# Patient Record
Sex: Female | Born: 1940 | Race: White | Hispanic: No | Marital: Married | Smoking: Former smoker
Health system: Southern US, Community
[De-identification: ages and names within clinical notes are randomized; demographics above are authoritative.]

## PROBLEM LIST (undated history)

## (undated) DIAGNOSIS — E079 Disorder of thyroid, unspecified: Secondary | ICD-10-CM

## (undated) DIAGNOSIS — Z430 Encounter for attention to tracheostomy: Secondary | ICD-10-CM

## (undated) DIAGNOSIS — C50912 Malignant neoplasm of unspecified site of left female breast: Secondary | ICD-10-CM

## (undated) DIAGNOSIS — E119 Type 2 diabetes mellitus without complications: Secondary | ICD-10-CM

## (undated) DIAGNOSIS — Z452 Encounter for adjustment and management of vascular access device: Secondary | ICD-10-CM

## (undated) DIAGNOSIS — R109 Unspecified abdominal pain: Secondary | ICD-10-CM

## (undated) DIAGNOSIS — J690 Pneumonitis due to inhalation of food and vomit: Secondary | ICD-10-CM

## (undated) DIAGNOSIS — J9601 Acute respiratory failure with hypoxia: Secondary | ICD-10-CM

## (undated) DIAGNOSIS — Z8709 Personal history of other diseases of the respiratory system: Secondary | ICD-10-CM

## (undated) DIAGNOSIS — E039 Hypothyroidism, unspecified: Secondary | ICD-10-CM

## (undated) DIAGNOSIS — T4145XA Adverse effect of unspecified anesthetic, initial encounter: Secondary | ICD-10-CM

## (undated) DIAGNOSIS — G4733 Obstructive sleep apnea (adult) (pediatric): Secondary | ICD-10-CM

## (undated) DIAGNOSIS — I10 Essential (primary) hypertension: Secondary | ICD-10-CM

## (undated) DIAGNOSIS — I5032 Chronic diastolic (congestive) heart failure: Secondary | ICD-10-CM

## (undated) DIAGNOSIS — Z4659 Encounter for fitting and adjustment of other gastrointestinal appliance and device: Secondary | ICD-10-CM

## (undated) DIAGNOSIS — F411 Generalized anxiety disorder: Secondary | ICD-10-CM

## (undated) DIAGNOSIS — K219 Gastro-esophageal reflux disease without esophagitis: Secondary | ICD-10-CM

## (undated) DIAGNOSIS — R0682 Tachypnea, not elsewhere classified: Secondary | ICD-10-CM

## (undated) DIAGNOSIS — D62 Acute posthemorrhagic anemia: Secondary | ICD-10-CM

## (undated) DIAGNOSIS — K5790 Diverticulosis of intestine, part unspecified, without perforation or abscess without bleeding: Secondary | ICD-10-CM

## (undated) DIAGNOSIS — F329 Major depressive disorder, single episode, unspecified: Secondary | ICD-10-CM

## (undated) DIAGNOSIS — R935 Abnormal findings on diagnostic imaging of other abdominal regions, including retroperitoneum: Secondary | ICD-10-CM

## (undated) DIAGNOSIS — E785 Hyperlipidemia, unspecified: Secondary | ICD-10-CM

## (undated) DIAGNOSIS — G40901 Epilepsy, unspecified, not intractable, with status epilepticus: Secondary | ICD-10-CM

## (undated) DIAGNOSIS — I2699 Other pulmonary embolism without acute cor pulmonale: Secondary | ICD-10-CM

## (undated) DIAGNOSIS — M1990 Unspecified osteoarthritis, unspecified site: Secondary | ICD-10-CM

## (undated) DIAGNOSIS — J9611 Chronic respiratory failure with hypoxia: Secondary | ICD-10-CM

## (undated) DIAGNOSIS — G4730 Sleep apnea, unspecified: Secondary | ICD-10-CM

## (undated) DIAGNOSIS — F32A Depression, unspecified: Secondary | ICD-10-CM

## (undated) DIAGNOSIS — G9340 Encephalopathy, unspecified: Secondary | ICD-10-CM

## (undated) DIAGNOSIS — R569 Unspecified convulsions: Secondary | ICD-10-CM

## (undated) DIAGNOSIS — K5732 Diverticulitis of large intestine without perforation or abscess without bleeding: Secondary | ICD-10-CM

## (undated) DIAGNOSIS — R1115 Cyclical vomiting syndrome unrelated to migraine: Secondary | ICD-10-CM

## (undated) DIAGNOSIS — T8859XA Other complications of anesthesia, initial encounter: Secondary | ICD-10-CM

## (undated) DIAGNOSIS — Z86711 Personal history of pulmonary embolism: Secondary | ICD-10-CM

## (undated) DIAGNOSIS — I82409 Acute embolism and thrombosis of unspecified deep veins of unspecified lower extremity: Secondary | ICD-10-CM

## (undated) DIAGNOSIS — S060X9A Concussion with loss of consciousness of unspecified duration, initial encounter: Secondary | ICD-10-CM

## (undated) HISTORY — DX: Encounter for adjustment and management of vascular access device: Z45.2

## (undated) HISTORY — PX: BUNIONECTOMY WITH HAMMERTOE RECONSTRUCTION: SHX5600

## (undated) HISTORY — DX: Generalized anxiety disorder: F41.1

## (undated) HISTORY — PX: JOINT REPLACEMENT: SHX530

## (undated) HISTORY — DX: Encounter for attention to tracheostomy: Z43.0

## (undated) HISTORY — PX: ABDOMINAL HYSTERECTOMY: SHX81

## (undated) HISTORY — DX: Diverticulosis of intestine, part unspecified, without perforation or abscess without bleeding: K57.90

## (undated) HISTORY — DX: Abnormal findings on diagnostic imaging of other abdominal regions, including retroperitoneum: R93.5

## (undated) HISTORY — DX: Acute respiratory failure with hypoxia: J96.01

## (undated) HISTORY — DX: Encephalopathy, unspecified: G93.40

## (undated) HISTORY — DX: Essential (primary) hypertension: I10

## (undated) HISTORY — DX: Acute posthemorrhagic anemia: D62

## (undated) HISTORY — DX: Chronic respiratory failure with hypoxia: J96.11

## (undated) HISTORY — DX: Cyclical vomiting syndrome unrelated to migraine: R11.15

## (undated) HISTORY — DX: Encounter for fitting and adjustment of other gastrointestinal appliance and device: Z46.59

## (undated) HISTORY — DX: Epilepsy, unspecified, not intractable, with status epilepticus: G40.901

## (undated) HISTORY — PX: OTHER SURGICAL HISTORY: SHX169

## (undated) HISTORY — DX: Tachypnea, not elsewhere classified: R06.82

## (undated) HISTORY — DX: Sleep apnea, unspecified: G47.30

## (undated) HISTORY — PX: LAPAROSCOPIC CHOLECYSTECTOMY: SUR755

## (undated) HISTORY — DX: Personal history of other diseases of the respiratory system: Z87.09

## (undated) HISTORY — DX: Unspecified abdominal pain: R10.9

## (undated) HISTORY — DX: Diverticulitis of large intestine without perforation or abscess without bleeding: K57.32

## (undated) HISTORY — DX: Personal history of pulmonary embolism: Z86.711

## (undated) HISTORY — DX: Pneumonitis due to inhalation of food and vomit: J69.0

## (undated) HISTORY — DX: Type 2 diabetes mellitus without complications: E11.9

## (undated) HISTORY — PX: TOTAL KNEE ARTHROPLASTY: SHX125

---

## 2004-07-17 DIAGNOSIS — C50912 Malignant neoplasm of unspecified site of left female breast: Secondary | ICD-10-CM

## 2004-07-17 DIAGNOSIS — Z853 Personal history of malignant neoplasm of breast: Secondary | ICD-10-CM

## 2004-07-17 HISTORY — PX: BREAST BIOPSY: SHX20

## 2004-07-17 HISTORY — DX: Personal history of malignant neoplasm of breast: Z85.3

## 2004-07-17 HISTORY — DX: Malignant neoplasm of unspecified site of left female breast: C50.912

## 2004-07-17 HISTORY — PX: BREAST LUMPECTOMY: SHX2

## 2008-07-17 HISTORY — PX: TOTAL HIP ARTHROPLASTY: SHX124

## 2013-03-17 DIAGNOSIS — I2699 Other pulmonary embolism without acute cor pulmonale: Secondary | ICD-10-CM | POA: Insufficient documentation

## 2013-03-17 DIAGNOSIS — S060XAA Concussion with loss of consciousness status unknown, initial encounter: Secondary | ICD-10-CM

## 2013-03-17 DIAGNOSIS — I82409 Acute embolism and thrombosis of unspecified deep veins of unspecified lower extremity: Secondary | ICD-10-CM

## 2013-03-17 DIAGNOSIS — S060X9A Concussion with loss of consciousness of unspecified duration, initial encounter: Secondary | ICD-10-CM

## 2013-03-17 HISTORY — DX: Acute embolism and thrombosis of unspecified deep veins of unspecified lower extremity: I82.409

## 2013-03-17 HISTORY — DX: Other pulmonary embolism without acute cor pulmonale: I26.99

## 2013-03-17 HISTORY — DX: Concussion with loss of consciousness status unknown, initial encounter: S06.0XAA

## 2013-03-17 HISTORY — DX: Concussion with loss of consciousness of unspecified duration, initial encounter: S06.0X9A

## 2013-03-31 ENCOUNTER — Encounter (HOSPITAL_BASED_OUTPATIENT_CLINIC_OR_DEPARTMENT_OTHER): Payer: Self-pay | Admitting: Emergency Medicine

## 2013-03-31 ENCOUNTER — Emergency Department (HOSPITAL_BASED_OUTPATIENT_CLINIC_OR_DEPARTMENT_OTHER): Payer: Medicare PPO

## 2013-03-31 ENCOUNTER — Inpatient Hospital Stay (HOSPITAL_BASED_OUTPATIENT_CLINIC_OR_DEPARTMENT_OTHER)
Admission: EM | Admit: 2013-03-31 | Discharge: 2013-04-04 | DRG: 176 | Disposition: A | Discharge status: Home / Self Care | Payer: Medicare PPO | Attending: Internal Medicine | Admitting: Internal Medicine

## 2013-03-31 DIAGNOSIS — S0093XA Contusion of unspecified part of head, initial encounter: Secondary | ICD-10-CM

## 2013-03-31 DIAGNOSIS — E039 Hypothyroidism, unspecified: Secondary | ICD-10-CM

## 2013-03-31 DIAGNOSIS — E119 Type 2 diabetes mellitus without complications: Secondary | ICD-10-CM

## 2013-03-31 DIAGNOSIS — Z853 Personal history of malignant neoplasm of breast: Secondary | ICD-10-CM

## 2013-03-31 DIAGNOSIS — R296 Repeated falls: Secondary | ICD-10-CM | POA: Diagnosis present

## 2013-03-31 DIAGNOSIS — I824Z2 Acute embolism and thrombosis of unspecified deep veins of left distal lower extremity: Secondary | ICD-10-CM

## 2013-03-31 DIAGNOSIS — Z87891 Personal history of nicotine dependence: Secondary | ICD-10-CM

## 2013-03-31 DIAGNOSIS — S060XAA Concussion with loss of consciousness status unknown, initial encounter: Secondary | ICD-10-CM

## 2013-03-31 DIAGNOSIS — I2699 Other pulmonary embolism without acute cor pulmonale: Principal | ICD-10-CM

## 2013-03-31 DIAGNOSIS — G4733 Obstructive sleep apnea (adult) (pediatric): Secondary | ICD-10-CM

## 2013-03-31 DIAGNOSIS — S060X9A Concussion with loss of consciousness of unspecified duration, initial encounter: Secondary | ICD-10-CM

## 2013-03-31 DIAGNOSIS — I82409 Acute embolism and thrombosis of unspecified deep veins of unspecified lower extremity: Secondary | ICD-10-CM

## 2013-03-31 DIAGNOSIS — R404 Transient alteration of awareness: Secondary | ICD-10-CM | POA: Diagnosis present

## 2013-03-31 DIAGNOSIS — S060X1A Concussion with loss of consciousness of 30 minutes or less, initial encounter: Secondary | ICD-10-CM

## 2013-03-31 DIAGNOSIS — R42 Dizziness and giddiness: Secondary | ICD-10-CM | POA: Diagnosis present

## 2013-03-31 DIAGNOSIS — I10 Essential (primary) hypertension: Secondary | ICD-10-CM

## 2013-03-31 DIAGNOSIS — I824Z9 Acute embolism and thrombosis of unspecified deep veins of unspecified distal lower extremity: Secondary | ICD-10-CM | POA: Diagnosis present

## 2013-03-31 DIAGNOSIS — W19XXXA Unspecified fall, initial encounter: Secondary | ICD-10-CM

## 2013-03-31 HISTORY — DX: Disorder of thyroid, unspecified: E07.9

## 2013-03-31 HISTORY — DX: Essential (primary) hypertension: I10

## 2013-03-31 HISTORY — DX: Unspecified osteoarthritis, unspecified site: M19.90

## 2013-03-31 LAB — CBC WITH DIFFERENTIAL/PLATELET
Eosinophils Absolute: 0 10*3/uL (ref 0.0–0.7)
Lymphocytes Relative: 15 % (ref 12–46)
MCHC: 32.3 g/dL (ref 30.0–36.0)
Monocytes Absolute: 0.6 10*3/uL (ref 0.1–1.0)
Neutrophils Relative %: 74 % (ref 43–77)
RDW: 15 % (ref 11.5–15.5)

## 2013-03-31 LAB — TROPONIN I: Troponin I: <0.3 ng/mL (ref <0.30)

## 2013-03-31 LAB — URINALYSIS, ROUTINE W REFLEX MICROSCOPIC
Glucose, UA: NEGATIVE mg/dL
Nitrite: NEGATIVE
Protein, ur: NEGATIVE mg/dL

## 2013-03-31 LAB — COMPREHENSIVE METABOLIC PANEL
Albumin: 3 g/dL — ABNORMAL LOW (ref 3.5–5.2)
BUN: 27 mg/dL — ABNORMAL HIGH (ref 6–23)
Chloride: 95 mEq/L — ABNORMAL LOW (ref 96–112)
Creatinine, Ser: 1.4 mg/dL — ABNORMAL HIGH (ref 0.50–1.10)
Total Bilirubin: 0.5 mg/dL (ref 0.3–1.2)
Total Protein: 6.2 g/dL (ref 6.0–8.3)

## 2013-03-31 LAB — APTT: aPTT: 30 seconds (ref 24–37)

## 2013-03-31 MED ORDER — HEPARIN BOLUS VIA INFUSION
4000.0000 [IU] | Freq: Once | INTRAVENOUS | Status: AC
  Administered 2013-03-31: 4000 [IU] via INTRAVENOUS

## 2013-03-31 MED ORDER — IOHEXOL 350 MG/ML SOLN
64.0000 mL | Freq: Once | INTRAVENOUS | Status: AC | PRN
  Administered 2013-03-31: 64 mL via INTRAVENOUS

## 2013-03-31 MED ORDER — SODIUM CHLORIDE 0.9 % IV SOLN
Freq: Once | INTRAVENOUS | Status: AC
  Administered 2013-03-31: 21:00:00 via INTRAVENOUS

## 2013-03-31 MED ORDER — HEPARIN (PORCINE) IN NACL 100-0.45 UNIT/ML-% IJ SOLN
900.0000 [IU]/h | INTRAMUSCULAR | Status: AC
Start: 2013-03-31 — End: 2013-04-03
  Administered 2013-03-31: 1150 [IU]/h via INTRAVENOUS
  Administered 2013-04-01 – 2013-04-02 (×2): 800 [IU]/h via INTRAVENOUS
  Filled 2013-03-31 (×5): qty 250

## 2013-03-31 MED ORDER — HEPARIN BOLUS VIA INFUSION: 4000.0000 [IU] | Freq: Once | INTRAVENOUS | Status: DC

## 2013-03-31 NOTE — ED Provider Notes (Signed)
Medical screening examination/treatment/procedure(s) were conducted as a shared visit with non-physician practitioner(s) and myself.  I personally evaluated the patient during the encounter CRITICAL CARE Performed by: Blanchie Dessert Total critical care time: 30 Critical care time was exclusive of separately billable procedures and treating other patients. Critical care was necessary to treat or prevent imminent or life-threatening deterioration. Critical care was time spent personally by me on the following activities: development of treatment plan with patient and/or surrogate as well as nursing, discussions with consultants, evaluation of patient's response to treatment, examination of patient, obtaining history from patient or surrogate, ordering and performing treatments and interventions, ordering and review of laboratory studies, ordering and review of radiographic studies, pulse oximetry and re-evaluation of patient's condition.   Pt present with fall and concern for concussion vs head bleed that will need CT but n/V intact without signs of neuro deficit and no anticoagulation.  Secondly pt with hypoxia requiring O2 and leg swelling concerning for DVT.  Also sx concerning for PE.  CT chest with evidence of bilateral PE and left DVT.  Pt started on heparin gtt after confirmed no head bleed.  Admitted for further care.  Blanchie Dessert, MD 03/31/13 2325

## 2013-03-31 NOTE — ED Provider Notes (Signed)
CSN: [id number]     Arrival date & time 03/31/13  1808 History   First MD Initiated Contact with Patient 03/31/13 1810     Chief Complaint  Patient presents with  . Fall  . Near Syncope   (Consider location/radiation/quality/duration/timing/severity/associated sxs/prior Treatment) Patient is a 72 y.o. female presenting with fall. The history is provided by the patient. No language interpreter was used.  Fall This is a new problem. The current episode started today. The problem has been gradually improving. Associated symptoms include myalgias. Pertinent negatives include no abdominal pain, chest pain or neck pain. Nothing aggravates the symptoms. She has tried nothing for the symptoms.  Pt was putting muffins in the oven and fell.  Pt reports she hit her head and was knocked out.   Pt reports she remembers the fall.  No loc before fall.   Pt does not know why she fell.   Pt has been getting increasingly short of breath for the past 2 days.  Pt traveled here from [place] recently.  Pt reports she has had pain in her left leg and swelling in her left leg  Past Medical History  Diagnosis Date  . Diabetes mellitus without complication   . Hypertension   . Thyroid disease     Hypothyroid  . Cancer     breast   Past Surgical History  Procedure Laterality Date  . Joint replacement      bilateral knee, L hip   History reviewed. No pertinent family history. History  Substance Use Topics  . Smoking status: Never Smoker   . Smokeless tobacco: Not on file  . Alcohol Use: No   OB History   Grav Para Term Preterm Abortions TAB SAB Ect Mult Living                 Review of Systems  HENT: Negative for neck pain.   Cardiovascular: Negative for chest pain.  Gastrointestinal: Negative for abdominal pain.  Musculoskeletal: Positive for myalgias.  All other systems reviewed and are negative.    Allergies  Review of patient's allergies indicates no known allergies.  Home Medications    Current Outpatient Rx  Name  Route  Sig  Dispense  Refill  . metFORMIN (GLUCOPHAGE) 1000 MG tablet   Oral   Take 1,000 mg by mouth 2 (two) times daily with a meal.          Pulse 76  SpO2 83% Physical Exam  Nursing note and vitals reviewed. Constitutional: She is oriented to person, place, and time. She appears well-developed and well-nourished.  HENT:  Head: Normocephalic and atraumatic.  Eyes: Conjunctivae are normal. Pupils are equal, round, and reactive to light.  Neck: Normal range of motion.  Cardiovascular: Normal rate and regular rhythm.   Pulmonary/Chest: Effort normal.  Abdominal: Soft. Bowel sounds are normal.  Musculoskeletal: She exhibits tenderness.  Tender left calf,  Positive homan's sign    Neurological: She is alert and oriented to person, place, and time. She has normal reflexes.  Skin: Skin is warm.  Psychiatric: She has a normal mood and affect.    ED Course  Procedures (including critical care time) Labs Review Labs Reviewed  CBC WITH DIFFERENTIAL  COMPREHENSIVE METABOLIC PANEL  URINALYSIS, ROUTINE W REFLEX MICROSCOPIC   Imaging Review Ct Head Wo Contrast  03/31/2013   CLINICAL DATA:  Fall with posterior head injury.  EXAM: CT HEAD WITHOUT CONTRAST  TECHNIQUE: Contiguous axial images were obtained from the base of the skull  through the vertex without intravenous contrast.  COMPARISON:  None.  FINDINGS: There is some scalp soft tissue swelling over the right posterior parietal region. No associated skull fracture is seen. The brain shows small vessel disease and atrophy. The brain demonstrates no evidence of hemorrhage, infarction, edema, mass effect, extra-axial fluid collection, hydrocephalus or mass lesion.  IMPRESSION: Right posterior parietal scalp soft tissue injury. No acute intracranial findings.   Electronically Signed   By: Aletta Edouard   On: 03/31/2013 19:46   Ct Angio Chest Pe W/cm &/or Wo Cm  03/31/2013   CLINICAL DATA:  Shortness of  breast, leg pain and history of breast carcinoma.  EXAM: CT ANGIOGRAPHY CHEST WITH CONTRAST  TECHNIQUE: Multidetector CT imaging of the chest was performed using the standard protocol during bolus administration of intravenous contrast. Multiplanar CT image reconstructions including MIPs were obtained to evaluate the vascular anatomy.  CONTRAST:  73m OMNIPAQUE IOHEXOL 350 MG/ML SOLN  COMPARISON:  None.  FINDINGS: Acute pulmonary embolism is identified with moderate clot burden on the right clot which is nonobstructive in the lower lobe, middle lobe and upper lobe . Some clot also extends into the main right pulmonary artery. Small clot burden is present on the left with nonocclusive thrombus extending into lower lobe branches and lingular branches. No associated gross evidence of right heart strain or pulmonary edema.  Lung windows show no parenchymal consolidation, infarcts or pleural effusions. A calcified granuloma is present in the right lower lobe.  No pericardial fluid is identified. There is at least 1 focal area of calcified plaque is identified in the region of the LAD. No masses, enlarged lymph nodes or bony lesions are identified.  Review of the MIP images confirms the above findings.  IMPRESSION: Acute pulmonary embolism with moderate clot burden on the right and smaller clot burden on the left. Thrombus is nonocclusive with the most prominent thrombus in the right lower lobe.  CriticalValue/emergent results were called by telephone at the time of interpretation on 03/31/2013 at 7:55 PMto Dr. PMaryan Rued who verbally acknowledged these results.   Electronically Signed   By: GAletta Edouard  On: 03/31/2013 19:58   UKoreaVenous Img Lower Unilateral Left  03/31/2013   *RADIOLOGY REPORT*  Clinical Data: Left leg pain and swelling  LEFT LOWER EXTREMITY VENOUS DUPLEX ULTRASOUND  Technique:  Gray-scale sonography with graded compression, as well as color Doppler and duplex ultrasound, were performed to evaluate  the deep venous system of the lower extremity from the level of the common femoral vein through the popliteal and proximal calf veins. Spectral Doppler was utilized to evaluate flow at rest and with distal augmentation maneuvers.  Comparison:  None.  Findings: The upper common femoral vein is compressible.  There is nonocclusive thrombus in the lower common femoral vein.   The femoral vein is nearly completely occluded and filled with thrombus.  The popliteal vein is noncompressible and occluded. Posterior tibial, anterior tibial, and peroneal veins are all occluded with thrombus.  The greater saphenous vein is also noncompressible and occluded with thrombus.  IMPRESSION: Extensive thrombus throughout the left lower extremity extending from the left common femoral vein to the calf.   Original Report Authenticated By: AMarybelle Killings M.D.    MDM   1. PE (pulmonary embolism)   2. DVT, lower extremity, distal, acute, left   3. Contusion of head, initial encounter   4. Fall at home, initial encounter    Dr. pMaryan Ruedin to see and examine pt.  Date: 03/31/2013  Rate: 74  Rhythm: normal sinus rhythm  QRS Axis: normal  Intervals: normal  ST/T Wave abnormalities: nonspecific ST changes  Conduction Disutrbances:none  Narrative Interpretation:   Old EKG Reviewed: none available  Ct scan shows bilat PE's and ultrasound shows left leg DVt Heparin ordered.   Pt and family counseled on results.   Pulse Ox improved to 99% on 2 liters 02.  Fransico Meadow, PA-C 03/31/13 2119  Fransico Meadow, PA-C 03/31/13 2119

## 2013-03-31 NOTE — Progress Notes (Signed)
ANTICOAGULATION CONSULT NOTE - Initial Consult  Pharmacy Consult for Heparin Indication: PE  No Known Allergies  Patient Measurements: Height: _0  (154.9 cm) Weight: 180 lb (81.647 kg) IBW/kg (Calculated) : 47.8 Heparin Dosing Weight: 66.3 kg  Vital Signs: Temp: 99 F (37.2 C) (09/15 1826) Temp src: Oral (09/15 1826) BP: 125/66 mmHg (09/15 1826) Pulse Rate: 80 (09/15 1826)  Labs:  Recent Labs  03/31/13 1830  HGB 10.7*  HCT 33.1*  PLT 202  CREATININE 1.40*  TROPONINI <0.30    Estimated Creatinine Clearance: 35.2 ml/min (by C-G formula based on Cr of 1.4).   Medical History: Past Medical History  Diagnosis Date  . Diabetes mellitus without complication   . Hypertension   . Thyroid disease     Hypothyroid  . Cancer     breast    Medications:  See PTA medication list  Assessment: 72 yo female to begin a heparin drip for bilateral PE confirmed by CTA chest. No bleeding noted, H/H are low at baseline, platelets are wnl. SCr is elevated at 1.4.  Goal of Therapy:  Heparin level 0.3-0.7 units/ml Monitor platelets by anticoagulation protocol: Yes   Plan:  - Heparin 4000 unit IV bolus then infuse at 1150 units/hr - 8 hr heparin level - Daily heparin level and CBC - Monitor for signs/symptoms of bleeding  [facility], [place].D., BCPS Clinical Pharmacist Pager: [phone] 03/31/2013 8:57 PM

## 2013-03-31 NOTE — ED Notes (Signed)
Pt in radiology at this time. 

## 2013-03-31 NOTE — ED Notes (Signed)
Pt reports falling yesterday and today anfter an episode of dizziness. Still has dizziness and nausea when hob elevated. Also having SOB with exertion and pain on LLE.

## 2013-04-01 ENCOUNTER — Encounter (HOSPITAL_COMMUNITY): Payer: Self-pay | Admitting: Family Medicine

## 2013-04-01 DIAGNOSIS — S060XAA Concussion with loss of consciousness status unknown, initial encounter: Secondary | ICD-10-CM

## 2013-04-01 DIAGNOSIS — Z853 Personal history of malignant neoplasm of breast: Secondary | ICD-10-CM

## 2013-04-01 DIAGNOSIS — I10 Essential (primary) hypertension: Secondary | ICD-10-CM

## 2013-04-01 DIAGNOSIS — S060X1A Concussion with loss of consciousness of 30 minutes or less, initial encounter: Secondary | ICD-10-CM

## 2013-04-01 DIAGNOSIS — I2699 Other pulmonary embolism without acute cor pulmonale: Secondary | ICD-10-CM

## 2013-04-01 DIAGNOSIS — I82409 Acute embolism and thrombosis of unspecified deep veins of unspecified lower extremity: Secondary | ICD-10-CM

## 2013-04-01 DIAGNOSIS — E119 Type 2 diabetes mellitus without complications: Secondary | ICD-10-CM

## 2013-04-01 DIAGNOSIS — E039 Hypothyroidism, unspecified: Secondary | ICD-10-CM

## 2013-04-01 DIAGNOSIS — Z9989 Dependence on other enabling machines and devices: Secondary | ICD-10-CM | POA: Insufficient documentation

## 2013-04-01 DIAGNOSIS — G4733 Obstructive sleep apnea (adult) (pediatric): Secondary | ICD-10-CM

## 2013-04-01 DIAGNOSIS — S060X9A Concussion with loss of consciousness of unspecified duration, initial encounter: Secondary | ICD-10-CM

## 2013-04-01 HISTORY — DX: Type 2 diabetes mellitus without complications: E11.9

## 2013-04-01 HISTORY — DX: Essential (primary) hypertension: I10

## 2013-04-01 HISTORY — DX: Obstructive sleep apnea (adult) (pediatric): G47.33

## 2013-04-01 HISTORY — DX: Personal history of malignant neoplasm of breast: Z85.3

## 2013-04-01 HISTORY — DX: Other pulmonary embolism without acute cor pulmonale: I26.99

## 2013-04-01 LAB — GLUCOSE, CAPILLARY
Glucose-Capillary: 102 mg/dL — ABNORMAL HIGH (ref 70–99)
Glucose-Capillary: 117 mg/dL — ABNORMAL HIGH (ref 70–99)
Glucose-Capillary: 138 mg/dL — ABNORMAL HIGH (ref 70–99)
Glucose-Capillary: 156 mg/dL — ABNORMAL HIGH (ref 70–99)
Glucose-Capillary: 95 mg/dL (ref 70–99)

## 2013-04-01 LAB — TSH: TSH: 0.862 u[IU]/mL (ref 0.350–4.500)

## 2013-04-01 LAB — PHOSPHORUS: Phosphorus: 4 mg/dL (ref 2.3–4.6)

## 2013-04-01 LAB — BASIC METABOLIC PANEL
BUN: 20 mg/dL (ref 6–23)
Chloride: 98 mEq/L (ref 96–112)
Creatinine, Ser: 1.05 mg/dL (ref 0.50–1.10)
GFR calc Af Amer: 60 mL/min — ABNORMAL LOW (ref >90)

## 2013-04-01 LAB — URINE CULTURE
Colony Count: NO GROWTH
Culture: NO GROWTH

## 2013-04-01 LAB — PROTIME-INR
INR: 1.08 (ref 0.00–1.49)
Prothrombin Time: 13.8 seconds (ref 11.6–15.2)

## 2013-04-01 MED ORDER — GLYBURIDE 1.25 MG PO TABS
1.2500 mg | ORAL_TABLET | Freq: Every day | ORAL | Status: DC
  Administered 2013-04-01 – 2013-04-04 (×4): 1.25 mg via ORAL
  Filled 2013-04-01 (×5): qty 1

## 2013-04-01 MED ORDER — DEXTROSE 5 % IV SOLN
1.0000 g | INTRAVENOUS | Status: DC
  Administered 2013-04-01 – 2013-04-02 (×2): 1 g via INTRAVENOUS
  Filled 2013-04-01 (×3): qty 10

## 2013-04-01 MED ORDER — ONDANSETRON HCL 4 MG/2ML IJ SOLN
4.0000 mg | Freq: Four times a day (QID) | INTRAMUSCULAR | Status: DC | PRN
  Administered 2013-04-02: 4 mg via INTRAVENOUS
  Filled 2013-04-01: qty 2

## 2013-04-01 MED ORDER — ACETAMINOPHEN 325 MG PO TABS: 650.0000 mg | ORAL_TABLET | Freq: Four times a day (QID) | ORAL | Status: DC | PRN

## 2013-04-01 MED ORDER — INSULIN ASPART 100 UNIT/ML ~~LOC~~ SOLN: 0.0000 [IU] | Freq: Every day | SUBCUTANEOUS | Status: DC

## 2013-04-01 MED ORDER — LEVOTHYROXINE SODIUM 100 MCG PO TABS
100.0000 ug | ORAL_TABLET | Freq: Every day | ORAL | Status: DC
  Administered 2013-04-01 – 2013-04-04 (×4): 100 ug via ORAL
  Filled 2013-04-01 (×5): qty 1

## 2013-04-01 MED ORDER — MECLIZINE HCL 25 MG PO TABS
25.0000 mg | ORAL_TABLET | Freq: Three times a day (TID) | ORAL | Status: DC | PRN
  Filled 2013-04-01 (×2): qty 1

## 2013-04-01 MED ORDER — SODIUM CHLORIDE 0.9 % IJ SOLN
3.0000 mL | Freq: Two times a day (BID) | INTRAMUSCULAR | Status: DC
  Administered 2013-04-01 – 2013-04-04 (×7): 3 mL via INTRAVENOUS

## 2013-04-01 MED ORDER — METFORMIN HCL 500 MG PO TABS
500.0000 mg | ORAL_TABLET | Freq: Two times a day (BID) | ORAL | Status: DC
  Filled 2013-04-01 (×3): qty 1

## 2013-04-01 MED ORDER — SIMVASTATIN 40 MG PO TABS
40.0000 mg | ORAL_TABLET | Freq: Every evening | ORAL | Status: DC
  Filled 2013-04-01: qty 1

## 2013-04-01 MED ORDER — DULOXETINE HCL 60 MG PO CPEP
60.0000 mg | ORAL_CAPSULE | Freq: Every day | ORAL | Status: DC
  Administered 2013-04-01 – 2013-04-04 (×4): 60 mg via ORAL
  Filled 2013-04-01 (×4): qty 1

## 2013-04-01 MED ORDER — PANTOPRAZOLE SODIUM 40 MG PO TBEC
40.0000 mg | DELAYED_RELEASE_TABLET | Freq: Every day | ORAL | Status: DC
  Administered 2013-04-01 – 2013-04-04 (×4): 40 mg via ORAL
  Filled 2013-04-01 (×4): qty 1

## 2013-04-01 MED ORDER — WARFARIN - PHARMACIST DOSING INPATIENT: Freq: Every day | Status: DC

## 2013-04-01 MED ORDER — IRBESARTAN 300 MG PO TABS
300.0000 mg | ORAL_TABLET | Freq: Every day | ORAL | Status: DC
  Administered 2013-04-01 – 2013-04-02 (×2): 300 mg via ORAL
  Filled 2013-04-01 (×2): qty 1

## 2013-04-01 MED ORDER — VERAPAMIL HCL 120 MG PO TABS
180.0000 mg | ORAL_TABLET | Freq: Every day | ORAL | Status: DC
  Administered 2013-04-01 – 2013-04-04 (×4): 180 mg via ORAL
  Filled 2013-04-01 (×4): qty 1.5

## 2013-04-01 MED ORDER — ATORVASTATIN CALCIUM 20 MG PO TABS
20.0000 mg | ORAL_TABLET | Freq: Every day | ORAL | Status: DC
  Administered 2013-04-01 – 2013-04-03 (×3): 20 mg via ORAL
  Filled 2013-04-01 (×4): qty 1

## 2013-04-01 MED ORDER — METOPROLOL SUCCINATE ER 100 MG PO TB24
200.0000 mg | ORAL_TABLET | Freq: Every day | ORAL | Status: DC
  Administered 2013-04-01 – 2013-04-04 (×4): 200 mg via ORAL
  Filled 2013-04-01 (×4): qty 2

## 2013-04-01 MED ORDER — FUROSEMIDE 40 MG PO TABS: 40.0000 mg | ORAL_TABLET | Freq: Every day | ORAL | Status: DC

## 2013-04-01 MED ORDER — SENNOSIDES-DOCUSATE SODIUM 8.6-50 MG PO TABS
1.0000 | ORAL_TABLET | Freq: Every evening | ORAL | Status: DC | PRN
  Filled 2013-04-01 (×2): qty 1

## 2013-04-01 MED ORDER — ONDANSETRON HCL 4 MG PO TABS: 4.0000 mg | ORAL_TABLET | Freq: Four times a day (QID) | ORAL | Status: DC | PRN

## 2013-04-01 MED ORDER — INSULIN ASPART 100 UNIT/ML ~~LOC~~ SOLN
0.0000 [IU] | Freq: Three times a day (TID) | SUBCUTANEOUS | Status: DC
  Administered 2013-04-01: 2 [IU] via SUBCUTANEOUS
  Administered 2013-04-02: 3 [IU] via SUBCUTANEOUS
  Administered 2013-04-03: 5 [IU] via SUBCUTANEOUS
  Administered 2013-04-03 – 2013-04-04 (×2): 2 [IU] via SUBCUTANEOUS

## 2013-04-01 MED ORDER — HYDROCODONE-ACETAMINOPHEN 5-325 MG PO TABS
1.0000 | ORAL_TABLET | ORAL | Status: DC | PRN
  Administered 2013-04-01 – 2013-04-02 (×2): 2 via ORAL
  Filled 2013-04-01 (×2): qty 2

## 2013-04-01 MED ORDER — ACETAMINOPHEN 650 MG RE SUPP: 650.0000 mg | Freq: Four times a day (QID) | RECTAL | Status: DC | PRN

## 2013-04-01 MED ORDER — SPIRONOLACTONE 25 MG PO TABS
25.0000 mg | ORAL_TABLET | Freq: Every day | ORAL | Status: DC
  Administered 2013-04-01 – 2013-04-02 (×2): 25 mg via ORAL
  Filled 2013-04-01 (×2): qty 1

## 2013-04-01 NOTE — Progress Notes (Signed)
UR Completed Olamae Ferrara Graves-Bigelow, RN,BSN [phone]  

## 2013-04-01 NOTE — Care Management Note (Signed)
    Page 1 of 1   04/04/2013     10:54:43 AM   CARE MANAGEMENT NOTE 04/04/2013  Patient:  Amanda Short, Amanda Short   Account Number:  [id number]  Date Initiated:  04/01/2013  Documentation initiated by:  GRAVES-BIGELOW,Xochilth Standish  Subjective/Objective Assessment:   Pt admitted for fall. + for PE/DVT. Initiated on IV heparin.     Action/Plan:   CM will continue to monitor for disposition needs.   Anticipated DC Date:  04/03/2013   Anticipated DC Plan:  [place]  CM consult      Choice offered to / List presented to:             Status of service:  Completed, signed off Medicare Important Message given?   (If response is "NO", the following Medicare IM given date fields will be blank) Date Medicare IM given:   Date Additional Medicare IM given:    Discharge Disposition:  HOME/SELF CARE  Per UR Regulation:  Reviewed for med. necessity/level of care/duration of stay  If discussed at [address] of Stay Meetings, dates discussed:    Comments:   04-04-13 [place], [place] [phone] CM faxed information to the neuro rehab facility for outpatient f/u. Pt aware and f/u appointment made at [facility] [place] for f/u lab draws. Will make husband aware and fax information to office once husband arrives. No further needs from CM at this time.  04-03-13 [place], [place] [phone] [place] check in process for lovenox. Pt uses [facility] in [place]. CM will call to see if medication is available. Per husband he will be able to give injections and the RN to do lovenox teaching today. [facility] [address] - and medication is available for 10 syringes. Test claim done & co pay  will be $10.00. Pt states she has PCP in [place] and does not have one here in [place]. Pt to call PCP in [place] to see who they may recommend for f/u here in [place]. CM to touch base with pt this afternoon.

## 2013-04-01 NOTE — Progress Notes (Signed)
Pt will place on when ready for bed. Pt encouraged to call RT if needing any assistance. Family at bedside. No distress noted.

## 2013-04-01 NOTE — Progress Notes (Signed)
Pt came from [facility] with dx of DVT and PE, pt came to the floor with heparin drip infusing. Admitting MD was paged as ordered,  Pt is alert and oriented x4, husband is at the bedside, pt was placed on tele, vital signs were obtained, admission hx and assessment was completed. Will continue to monitor pt.-----Windell Musson, rn

## 2013-04-01 NOTE — Progress Notes (Signed)
ANTICOAGULATION CONSULT NOTE - Follow Up Consult  Pharmacy Consult for heparin Indication: pulmonary embolus  Labs:  Recent Labs  03/31/13 1830 03/31/13 2047 04/01/13 0500  HGB 10.7*  --   --   HCT 33.1*  --   --   PLT 202  --   --   APTT  --  30  --   LABPROT  --  13.9  --   INR  --  1.09  --   HEPARINUNFRC  --   --  0.96*  CREATININE 1.40*  --   --   TROPONINI <0.30  --   --     Assessment: 72yo female supratherapeutic on heparin with initial dosing for PE.  Goal of Therapy:  Heparin level 0.3-0.7 units/ml   Plan:  Will decrease heparin gtt by 2 units/kg/hr to 1000 units/hr and check level in [place], PharmD, BCPS  04/01/2013,6:00 AM

## 2013-04-01 NOTE — Progress Notes (Signed)
PT Cancellation Note  Patient Details Name: Amanda Short MRN: [id number] DOB: May 16, 1941   Cancelled Treatment:    Reason Eval/Treat Not Completed: Other (comment) (pt with acute DVT, PE not yet 24hrs since admit on heparin and RN asked to hold. Will wait until next date to initiate eval.)   [name] 04/01/2013, 12:03 PM Elwyn Reach, [place]

## 2013-04-01 NOTE — H&P (Signed)
PCP:   Dr. Richrd Sox in [place]   Chief Complaint:  Fall  HPI: This is a 72 year old female who is here visiting from [place], today she was in the kitchen cooking when she fell. She hit her head and had a brief loss of consciousness. It was unwitnessed fall. Patient states she saw stars when she hit her head and on awakening she had nausea but no vomiting. She has a mild headache and continues to be dizzy since hitting her head. When her husband came home, he called 911 she was brought to the ER. The patient identifies no precipitating causes. She denies tripping, denies chest pain, denies palpitations. Of note yesterday she was also sitting in her chair, she leaned over to pick up an object and fell out of the chair and hit her head.  While in the ER at med center [facility] further questioning revealed a complaint of shortness of breath and left leg pain and swelling for the past 2-3 days. Workup revealed that the patient has a DVT and PE, we were called and a requested transfer was made. The patient reports decreased activity because of back problems, however, she does her ADLs, she does not use a cane or walker and she mobilizes around her house. She has a history of breast cancer, she's been cancer free for 7 years. Her last visit with her oncologist was in January, workup included mammogram and blood work. All of which were normal. The patient reports no altered mentation, no seizures.  The patient states she's been in good health. No nausea, vomiting, diarrhea. She states her appetite is good as is her energy. She reports no weight loss.   Review of Systems:  The patient denies anorexia, fever, weight loss,, vision loss, decreased hearing, hoarseness, chest pain, syncope, dyspnea on exertion, peripheral edema, balance deficits, hemoptysis, abdominal pain, melena, hematochezia, severe indigestion/heartburn, hematuria, incontinence, genital sores, muscle weakness, suspicious skin  lesions, transient blindness, difficulty walking, depression, unusual weight change, abnormal bleeding, enlarged lymph nodes, angioedema, and breast masses.  Past Medical History: Past Medical History  Diagnosis Date  . Diabetes mellitus without complication   . Hypertension   . Thyroid disease     Hypothyroid  . Cancer     breast  . Arthritis    Past Surgical History  Procedure Laterality Date  . Joint replacement      bilateral knee, L hip  . Breast lumpectomy  left  . Abdominal hysterectomy    . Bunionectomy    . Cholecystectomy      Medications: Prior to Admission medications   Medication Sig Start Date End Date Taking? Authorizing Provider  Azilsartan Medoxomil (EDARBI) 80 MG TABS Take 80 mg by mouth.   Yes Historical Provider, MD  clonazePAM (KLONOPIN) 0.5 MG tablet Take 0.5 mg by mouth daily.   Yes Historical Provider, MD  DULoxetine (CYMBALTA) 60 MG capsule Take 60 mg by mouth daily.   Yes Historical Provider, MD  furosemide (LASIX) 40 MG tablet Take 40 mg by mouth daily.   Yes Historical Provider, MD  glyBURIDE (DIABETA) 1.25 MG tablet Take 1.25 mg by mouth daily with breakfast.   Yes Historical Provider, MD  levothyroxine (SYNTHROID, LEVOTHROID) 100 MCG tablet Take 100 mcg by mouth daily before breakfast.   Yes Historical Provider, MD  metFORMIN (GLUCOPHAGE) 1000 MG tablet Take 500 mg by mouth 2 (two) times daily with a meal.    Yes Historical Provider, MD  metoprolol (TOPROL-XL) 200 MG 24 hr  tablet Take 200 mg by mouth daily.   Yes Historical Provider, MD  omeprazole (PRILOSEC) 40 MG capsule Take 40 mg by mouth 2 (two) times daily.   Yes Historical Provider, MD  simvastatin (ZOCOR) 40 MG tablet Take 40 mg by mouth every evening.   Yes Historical Provider, MD  spironolactone (ALDACTONE) 25 MG tablet Take 25 mg by mouth daily.   Yes Historical Provider, MD  verapamil (CALAN) 120 MG tablet Take 180 mg by mouth daily.   Yes Historical Provider, MD    Allergies:  No  Known Allergies  Social History:  reports that she has quit smoking. She has never used smokeless tobacco. She reports that she does not drink alcohol or use illicit drugs.  Family History: Family History  Problem Relation Age of Onset  . Diabetes      Physical Exam: Filed Vitals:   03/31/13 2106 03/31/13 2217 03/31/13 2230 03/31/13 2346  BP: 102/66 100/58 128/60 101/41  Pulse: 78 75 81 78  Temp:    98.4 F (36.9 C)  TempSrc:    Oral  Resp: _0 Height:    _1  (1.549 m)  Weight:    81.602 kg (179 lb 14.4 oz)  SpO2: 95% 95% 94% 95%    General:  Alert and oriented times three, well developed and nourished, no acute distress Eyes: PERRLA, pink conjunctiva, no scleral icterus ENT: Moist oral mucosa, neck supple, no thyromegaly Lungs: clear to ascultation, no wheeze, no crackles, no use of accessory muscles Cardiovascular: regular rate and rhythm, no regurgitation, no gallops, no murmurs. No carotid bruits, no JVD Abdomen: soft, positive BS, non-tender, non-distended, no organomegaly, not an acute abdomen GU: not examined Neuro: CN II - XII grossly intact, sensation intact Musculoskeletal: strength 5/5 all extremities, no clubbing, cyanosis or edema. Left lower extremity swollen. Skin: no rash, no subcutaneous crepitation, no decubitus Psych: appropriate patient   Labs on Admission:   Recent Labs  03/31/13 1830  NA 136  K 4.7  CL 95*  CO2 28  GLUCOSE 145*  BUN 27*  CREATININE 1.40*  CALCIUM 8.2*    Recent Labs  03/31/13 1830  AST 12  ALT 14  ALKPHOS 77  BILITOT 0.5  PROT 6.2  ALBUMIN 3.0*   No results found for this basename: LIPASE, AMYLASE,  in the last 72 hours  Recent Labs  03/31/13 1830  WBC 10.8*  NEUTROABS 8.6*  HGB 10.7*  HCT 33.1*  MCV 96.2  PLT 202    Recent Labs  03/31/13 1830  TROPONINI <0.30     Micro Results: No results found for this or any previous visit (from the past 240 hour(s)).   Radiological Exams on  Admission: Ct Head Wo Contrast  03/31/2013   CLINICAL DATA:  Fall with posterior head injury.  EXAM: CT HEAD WITHOUT CONTRAST  TECHNIQUE: Contiguous axial images were obtained from the base of the skull through the vertex without intravenous contrast.  COMPARISON:  None.  FINDINGS: There is some scalp soft tissue swelling over the right posterior parietal region. No associated skull fracture is seen. The brain shows small vessel disease and atrophy. The brain demonstrates no evidence of hemorrhage, infarction, edema, mass effect, extra-axial fluid collection, hydrocephalus or mass lesion.  IMPRESSION: Right posterior parietal scalp soft tissue injury. No acute intracranial findings.   Electronically Signed   By: Aletta Edouard   On: 03/31/2013 19:46   Ct Angio Chest Pe W/cm &/or Wo Cm  03/31/2013  CLINICAL DATA:  Shortness of breast, leg pain and history of breast carcinoma.  EXAM: CT ANGIOGRAPHY CHEST WITH CONTRAST  TECHNIQUE: Multidetector CT imaging of the chest was performed using the standard protocol during bolus administration of intravenous contrast. Multiplanar CT image reconstructions including MIPs were obtained to evaluate the vascular anatomy.  CONTRAST:  45m OMNIPAQUE IOHEXOL 350 MG/ML SOLN  COMPARISON:  None.  FINDINGS: Acute pulmonary embolism is identified with moderate clot burden on the right clot which is nonobstructive in the lower lobe, middle lobe and upper lobe . Some clot also extends into the main right pulmonary artery. Small clot burden is present on the left with nonocclusive thrombus extending into lower lobe branches and lingular branches. No associated gross evidence of right heart strain or pulmonary edema.  Lung windows show no parenchymal consolidation, infarcts or pleural effusions. A calcified granuloma is present in the right lower lobe.  No pericardial fluid is identified. There is at least 1 focal area of calcified plaque is identified in the region of the LAD. No  masses, enlarged lymph nodes or bony lesions are identified.  Review of the MIP images confirms the above findings.  IMPRESSION: Acute pulmonary embolism with moderate clot burden on the right and smaller clot burden on the left. Thrombus is nonocclusive with the most prominent thrombus in the right lower lobe.  CriticalValue/emergent results were called by telephone at the time of interpretation on 03/31/2013 at 7:55 PMto Dr. PMaryan Rued who verbally acknowledged these results.   Electronically Signed   By: GAletta Edouard  On: 03/31/2013 19:58   UKoreaVenous Img Lower Unilateral Left  03/31/2013   *RADIOLOGY REPORT*  Clinical Data: Left leg pain and swelling  LEFT LOWER EXTREMITY VENOUS DUPLEX ULTRASOUND  Technique:  Gray-scale sonography with graded compression, as well as color Doppler and duplex ultrasound, were performed to evaluate the deep venous system of the lower extremity from the level of the common femoral vein through the popliteal and proximal calf veins. Spectral Doppler was utilized to evaluate flow at rest and with distal augmentation maneuvers.  Comparison:  None.  Findings: The upper common femoral vein is compressible.  There is nonocclusive thrombus in the lower common femoral vein.   The femoral vein is nearly completely occluded and filled with thrombus.  The popliteal vein is noncompressible and occluded. Posterior tibial, anterior tibial, and peroneal veins are all occluded with thrombus.  The greater saphenous vein is also noncompressible and occluded with thrombus.  IMPRESSION: Extensive thrombus throughout the left lower extremity extending from the left common femoral vein to the calf.   Original Report Authenticated By: AMarybelle Killings M.D.    Assessment/Plan Present on Admission:  PE/DVT Unclear etiology Continue heparin drip and repeat CT head in a.m. given the fact that patient did have a significant head trauma.  If repeat CT head is normal in a.m. can convert patient to Lovenox   Given patient's history of breast cancer, concerned recurrent malignancy could be the cause of patient's DVTs/PE. Patient advised to followup with oncologist for further assessment Hypercoagulable panel ordered   2-D echo ordered to evaluate right heart strain Coumadin pharmacy to dose Monitor in telemetry Concussion Vertigo Patient does complain of some continued dizziness/vertigo - likely related to patient's concussion. Continue to monitor for improvement Meclizine when necessary Falls Also unclear etiology. Physical therapy consult.  Monitor on telemetry. Check magnesium and phosphorus level Obstructive sleep apnea CPAP ordered Diabetes mellitus Hypertension Thyroid disease Resume home medications  No  need for DVT prophylaxis Full code  Time in: 1220: Time out: 1 AM  Avea Mcgowen 04/01/2013, 12:40 AM

## 2013-04-01 NOTE — Progress Notes (Signed)
ANTICOAGULATION CONSULT NOTE - Follow Up Consult  Pharmacy Consult for heparin Indication: pulmonary embolus  Labs:  Recent Labs  03/31/13 1830 03/31/13 2047 04/01/13 0500 04/01/13 1344 04/01/13 2310  HGB 10.7*  --   --   --   --   HCT 33.1*  --   --   --   --   PLT 202  --   --   --   --   APTT  --  30  --   --   --   LABPROT  --  13.9 13.8  --   --   INR  --  1.09 1.08  --   --   HEPARINUNFRC  --   --  0.96* 0.72* 0.35  CREATININE 1.40*  --  1.05  --   --   TROPONINI <0.30  --   --   --   --     Assessment: 72yo female therapeutic on heparin for PE.  Goal of Therapy:  Heparin level 0.3-0.7 units/ml   Plan:  Continue heparin at 800 units/hr F/u am labs  Excell Seltzer, PharmD 04/01/2013,11:47 PM

## 2013-04-01 NOTE — Progress Notes (Signed)
Patient was seen and examined, admitted by Dr. Ernesto Rutherford this morning Briefly 72 year old female, had a syncopal episode at home, hitting her head, unwitnessed fall, who was also complaining of shortness of breath and left leg pain for last 2-3 days prior to admission. Workup revealed left lower extremity DVT and acute PE. Patient is admitted for further workup.  Acute bilateral pulmonary embolism with left lower extremity DVT - CT angiogram of the chest showed acute pulmonary embolism with moderate clot burden on the right and small clot burden on the left.   -Currently on heparin drip, Doppler ultrasound of the lower extremity positive for acute LLE DVT - CT head at the time of admission showed no intracranial bleeding. Patient however did hit her head and has superficial scalp bruising/ecchymosis posteriorly. Will repeat CT head tomorrow morning. If no intracranial bleeding, patient can be started on oral anti-coagulation. - 2-D echo ordered   RAI,RIPUDEEP M.D. [facility] 04/01/2013, 1:16 PM  Pager: [phone]

## 2013-04-01 NOTE — Progress Notes (Signed)
PHARMACY FOLLOW UP NOTE  Pharmacy Consult for : Heparin Indication:  PE, LLE DVT  Dosing Weight: 66.3 kg  Labs:  Recent Labs  03/31/13 1830 03/31/13 2047 04/01/13 0500 04/01/13 1344  HGB 10.7*  --   --   --   HCT 33.1*  --   --   --   PLT 202  --   --   --   APTT  --  30  --   --   LABPROT  --  13.9 13.8  --   INR  --  1.09 1.08  --   HEPARINUNFRC  --   --  0.96* 0.72*  CREATININE 1.40*  --  1.05  --    Lab Results  Component Value Date   INR 1.08 04/01/2013   INR 1.09 03/31/2013    Medications:  Scheduled:  . cefTRIAXone (ROCEPHIN)  IV  1 g Intravenous Q24H  . DULoxetine  60 mg Oral Daily  . glyBURIDE  1.25 mg Oral Q breakfast  . insulin aspart  0-15 Units Subcutaneous TID WC  . insulin aspart  0-5 Units Subcutaneous QHS  . irbesartan  300 mg Oral Daily  . levothyroxine  100 mcg Oral QAC breakfast  . metoprolol  200 mg Oral Daily  . pantoprazole  40 mg Oral Daily  . simvastatin  40 mg Oral QPM  . sodium chloride  3 mL Intravenous Q12H  . spironolactone  25 mg Oral Daily  . verapamil  180 mg Oral Daily  . Warfarin - Pharmacist Dosing Inpatient   Does not apply q1800   Infusions:  . heparin 1,000 Units/hr     Assessment:  Patient is on UFH infusion for treatment of new PE and LLE DVT.  Heparin infusing at 1000 units/hr.  Heparin level above therapeutic goal.  Heparin level 0.72 units/ml.  No bleeding complications noted   Goal of Therapy:  Heparin level 0.3-0.7 units/ml Monitor for bleeding complications, platelets.   Plan:  Reduce Heparin infusion to 800 units/hr. Will check Heparin Level in 8 hours [2330 pm] Daily Heparin Levels, CBC.  Monitor for bleeding complications   Selena Batten.D 04/01/2013, 3:16 PM

## 2013-04-01 NOTE — Progress Notes (Signed)
Pt did have an episode of v-tach- v-fib for about 2secs and then back into NSR, this morning. Pt was asymtomatic, v/s were stable, t-98.0, p-99, r-18, bp-98/55, O2-95 on 2l. K. Schorr , (PA) was notified, who said to keep monitoring pt. Will continue to monitor pt.---Amanda Hannay, rn

## 2013-04-02 ENCOUNTER — Inpatient Hospital Stay (HOSPITAL_COMMUNITY): Payer: Medicare PPO

## 2013-04-02 DIAGNOSIS — E119 Type 2 diabetes mellitus without complications: Secondary | ICD-10-CM

## 2013-04-02 DIAGNOSIS — I369 Nonrheumatic tricuspid valve disorder, unspecified: Secondary | ICD-10-CM

## 2013-04-02 DIAGNOSIS — I824Z9 Acute embolism and thrombosis of unspecified deep veins of unspecified distal lower extremity: Secondary | ICD-10-CM

## 2013-04-02 DIAGNOSIS — S0003XA Contusion of scalp, initial encounter: Secondary | ICD-10-CM

## 2013-04-02 LAB — HEPARIN LEVEL (UNFRACTIONATED): Heparin Unfractionated: 0.37 IU/mL (ref 0.30–0.70)

## 2013-04-02 LAB — LUPUS ANTICOAGULANT PANEL
PTTLA 4:1 Mix: 177.2 secs — ABNORMAL HIGH (ref 28.0–43.0)
PTTLA Confirmation: 25.9 secs — ABNORMAL HIGH (ref <8.0)

## 2013-04-02 LAB — GLUCOSE, CAPILLARY
Glucose-Capillary: 119 mg/dL — ABNORMAL HIGH (ref 70–99)
Glucose-Capillary: 104 mg/dL — ABNORMAL HIGH (ref 70–99)
Glucose-Capillary: 197 mg/dL — ABNORMAL HIGH (ref 70–99)
Glucose-Capillary: 115 mg/dL — ABNORMAL HIGH (ref 70–99)

## 2013-04-02 LAB — CBC
MCH: 31 pg (ref 26.0–34.0)
MCHC: 32.8 g/dL (ref 30.0–36.0)
MCV: 94.3 fL (ref 78.0–100.0)
Platelets: 233 10*3/uL (ref 150–400)
RBC: 3.36 MIL/uL — ABNORMAL LOW (ref 3.87–5.11)

## 2013-04-02 LAB — PROTHROMBIN GENE MUTATION

## 2013-04-02 LAB — PROTEIN S ACTIVITY: Protein S Activity: 96 % (ref 69–129)

## 2013-04-02 MED ORDER — WARFARIN SODIUM 7.5 MG PO TABS
7.5000 mg | ORAL_TABLET | ORAL | Status: AC
  Administered 2013-04-02: 7.5 mg via ORAL
  Filled 2013-04-02: qty 1

## 2013-04-02 MED ORDER — WARFARIN VIDEO
1.0000 | Freq: Once | Status: AC
  Administered 2013-04-03: 1

## 2013-04-02 MED ORDER — ALUM & MAG HYDROXIDE-SIMETH 200-200-20 MG/5ML PO SUSP
30.0000 mL | ORAL | Status: DC | PRN
Start: 2013-04-02 — End: 2013-04-04
  Administered 2013-04-02: 30 mL via ORAL
  Filled 2013-04-02: qty 30

## 2013-04-02 MED ORDER — MAGNESIUM SULFATE 40 MG/ML IJ SOLN
2.0000 g | Freq: Once | INTRAMUSCULAR | Status: AC
  Administered 2013-04-02: 2 g via INTRAVENOUS
  Filled 2013-04-02: qty 50

## 2013-04-02 MED ORDER — SODIUM CHLORIDE 0.9 % IV SOLN: 20.0000 mL/h | INTRAVENOUS | Status: DC

## 2013-04-02 MED ORDER — COUMADIN BOOK
1.0000 | Freq: Once | Status: AC
  Administered 2013-04-02: 1
  Filled 2013-04-02 (×2): qty 1

## 2013-04-02 NOTE — Progress Notes (Addendum)
[facility] PROGRESS NOTE  Amanda Short WYO:[id number] DOB: 08-11-1940 DOA: 03/31/2013 PCP: No primary provider on file.  Assessment/Plan: 1-Acute bilateral pulmonary embolism with left lower extremity DVT Doppler: Extensive thrombus throughout the left lower extremity extending  from the left common femoral vein to the calf. Hypercoagulable work up pending.  ECHO pending.  Continue with heparin Gtt for today. Will transition to Lovenox tomorrow.  CT head negative for bleed.   2-Dizziness/ vertigo; post trauma. Repeat CT head negative. Hold diuretics. Vertigo Improved today. PT evaluation.   3-Pyuria: day 2 ceftriaxone. Urine culture no growth. Will discontinue antibiotics.  4-Diabetes mellitus continue with glyburide. SSI.  5-Hypertension: metoprolol, verapamil.  6-Thyroid disease; continue with synthroid.   Code Status: Full Family Communication: care discussed with husband who was at bedside.  Disposition Plan: remain inpatient.    Consultants:  none  Procedures: Doppler: Extensive thrombus throughout the left lower extremity extending  from the left common femoral vein to the calf.   Antibiotics: Ceftriaxone 9-16  HPI/Subjective: Still with dizziness, vertigo like symptoms with movement but better.   Objective: Filed Vitals:   04/02/13 0505  BP: 118/60  Pulse: 68  Temp: 98.2 F (36.8 C)  Resp: 18    Intake/Output Summary (Last 24 hours) at 04/02/13 1558 Last data filed at 04/02/13 0900  Gross per 24 hour  Intake  549.4 ml  Output    650 ml  Net -100.6 ml   Filed Weights   03/31/13 1826 03/31/13 2346  Weight: 81.647 kg (180 lb) 81.602 kg (179 lb 14.4 oz)    Exam:   General:  No distress.   Cardiovascular: S 1, S 2 RRR  Respiratory: CTA  Abdomen: Bs presents, soft, nt  Musculoskeletal: left LE edema.   Data Reviewed: Basic Metabolic Panel:  Recent Labs Lab 03/31/13 1830 04/01/13 0500  NA 136 136  K 4.7 4.5  CL 95* 98   CO2 28 22  GLUCOSE 145* 94  BUN 27* 20  CREATININE 1.40* 1.05  CALCIUM 8.2* 7.7*  MG  --  1.1*  PHOS  --  4.0   Liver Function Tests:  Recent Labs Lab 03/31/13 1830  AST 12  ALT 14  ALKPHOS 77  BILITOT 0.5  PROT 6.2  ALBUMIN 3.0*   No results found for this basename: LIPASE, AMYLASE,  in the last 168 hours No results found for this basename: AMMONIA,  in the last 168 hours CBC:  Recent Labs Lab 03/31/13 1830 04/02/13 0750  WBC 10.8* 7.4  NEUTROABS 8.6*  --   HGB 10.7* 10.4*  HCT 33.1* 31.7*  MCV 96.2 94.3  PLT 202 233   Cardiac Enzymes:  Recent Labs Lab 03/31/13 1830  TROPONINI <0.30   BNP (last 3 results) No results found for this basename: PROBNP,  in the last 8760 hours CBG:  Recent Labs Lab 04/01/13 1544 04/01/13 2048 04/02/13 0730 04/02/13 1139 04/02/13 1526  GLUCAP 95 117* 119* 104* 197*    Recent Results (from the past 240 hour(s))  URINE CULTURE     Status: None   Collection Time    03/31/13  8:06 PM      Result Value Range Status   Specimen Description URINE, CLEAN CATCH   Final   Special Requests NONE   Final   Culture  Setup Time     Final   Value: 04/01/2013 01:23     Performed at [facility]     Final  Value: NO GROWTH     Performed at [facility]   Culture     Final   Value: NO GROWTH     Performed at [facility]   Report Status 04/01/2013 FINAL   Final     Studies: Ct Head Wo Contrast  04/02/2013   CLINICAL DATA:  Head trauma. FOLLOW UP  EXAM: CT HEAD WITHOUT CONTRAST  TECHNIQUE: Contiguous axial images were obtained from the base of the skull through the vertex without intravenous contrast.  COMPARISON:  03/31/2013  FINDINGS: Generalized atrophy. Chronic microvascular ischemic change in the white matter. No acute infarct apparent  Negative for intracranial hemorrhage. Negative for mass lesion.  Right parietal scalp hematoma. Negative for skull fracture.  IMPRESSION: No acute  abnormality and no change from the prior CT.   Electronically Signed   By: Franchot Gallo M.D.   On: 04/02/2013 06:59   Ct Head Wo Contrast  03/31/2013   CLINICAL DATA:  Fall with posterior head injury.  EXAM: CT HEAD WITHOUT CONTRAST  TECHNIQUE: Contiguous axial images were obtained from the base of the skull through the vertex without intravenous contrast.  COMPARISON:  None.  FINDINGS: There is some scalp soft tissue swelling over the right posterior parietal region. No associated skull fracture is seen. The brain shows small vessel disease and atrophy. The brain demonstrates no evidence of hemorrhage, infarction, edema, mass effect, extra-axial fluid collection, hydrocephalus or mass lesion.  IMPRESSION: Right posterior parietal scalp soft tissue injury. No acute intracranial findings.   Electronically Signed   By: Aletta Edouard   On: 03/31/2013 19:46   Ct Angio Chest Pe W/cm &/or Wo Cm  03/31/2013   CLINICAL DATA:  Shortness of breast, leg pain and history of breast carcinoma.  EXAM: CT ANGIOGRAPHY CHEST WITH CONTRAST  TECHNIQUE: Multidetector CT imaging of the chest was performed using the standard protocol during bolus administration of intravenous contrast. Multiplanar CT image reconstructions including MIPs were obtained to evaluate the vascular anatomy.  CONTRAST:  55m OMNIPAQUE IOHEXOL 350 MG/ML SOLN  COMPARISON:  None.  FINDINGS: Acute pulmonary embolism is identified with moderate clot burden on the right clot which is nonobstructive in the lower lobe, middle lobe and upper lobe . Some clot also extends into the main right pulmonary artery. Small clot burden is present on the left with nonocclusive thrombus extending into lower lobe branches and lingular branches. No associated gross evidence of right heart strain or pulmonary edema.  Lung windows show no parenchymal consolidation, infarcts or pleural effusions. A calcified granuloma is present in the right lower lobe.  No pericardial fluid is  identified. There is at least 1 focal area of calcified plaque is identified in the region of the LAD. No masses, enlarged lymph nodes or bony lesions are identified.  Review of the MIP images confirms the above findings.  IMPRESSION: Acute pulmonary embolism with moderate clot burden on the right and smaller clot burden on the left. Thrombus is nonocclusive with the most prominent thrombus in the right lower lobe.  CriticalValue/emergent results were called by telephone at the time of interpretation on 03/31/2013 at 7:55 PMto Dr. PMaryan Rued who verbally acknowledged these results.   Electronically Signed   By: GAletta Edouard  On: 03/31/2013 19:58   [place]oreaVenous Img Lower Unilateral Left  03/31/2013   *RADIOLOGY REPORT*  Clinical Data: Left leg pain and swelling  LEFT LOWER EXTREMITY VENOUS DUPLEX ULTRASOUND  Technique:  Gray-scale sonography with graded compression, as  well as color Doppler and duplex ultrasound, were performed to evaluate the deep venous system of the lower extremity from the level of the common femoral vein through the popliteal and proximal calf veins. Spectral Doppler was utilized to evaluate flow at rest and with distal augmentation maneuvers.  Comparison:  None.  Findings: The upper common femoral vein is compressible.  There is nonocclusive thrombus in the lower common femoral vein.   The femoral vein is nearly completely occluded and filled with thrombus.  The popliteal vein is noncompressible and occluded. Posterior tibial, anterior tibial, and peroneal veins are all occluded with thrombus.  The greater saphenous vein is also noncompressible and occluded with thrombus.  IMPRESSION: Extensive thrombus throughout the left lower extremity extending from the left common femoral vein to the calf.   Original Report Authenticated By: Marybelle Killings, M.D.    Scheduled Meds: . atorvastatin  20 mg Oral q1800  . cefTRIAXone (ROCEPHIN)  IV  1 g Intravenous Q24H  . coumadin book  1 each Does not  apply Once  . DULoxetine  60 mg Oral Daily  . glyBURIDE  1.25 mg Oral Q breakfast  . insulin aspart  0-15 Units Subcutaneous TID WC  . insulin aspart  0-5 Units Subcutaneous QHS  . levothyroxine  100 mcg Oral QAC breakfast  . metoprolol  200 mg Oral Daily  . pantoprazole  40 mg Oral Daily  . sodium chloride  3 mL Intravenous Q12H  . verapamil  180 mg Oral Daily  . [START ON 04/03/2013] warfarin  1 each Does not apply Once  . Warfarin - Pharmacist Dosing Inpatient   Does not apply q1800   Continuous Infusions: . heparin 800 Units/hr (04/01/13 1601)    Active Problems:   Acute pulmonary embolism   DVT (deep venous thrombosis)   HX: breast cancer   Concussion   Diabetes   Essential hypertension, benign   Unspecified hypothyroidism   OSA on CPAP    Time spent: 35 minutes.     Camar Guyton  [facility] Pager [phone]. If 7PM-7AM, please contact night-coverage at [email or web address], password [facility] 04/02/2013, 3:58 PM  LOS: 2 days     Mg at 1.1. Will replete with IV magnesium supplement.

## 2013-04-02 NOTE — Progress Notes (Signed)
Echocardiogram 2D Echocardiogram has been performed.  Amanda Short 04/02/2013, 1:07 PM

## 2013-04-02 NOTE — Evaluation (Signed)
Physical Therapy Evaluation Patient Details Name: Amanda Short MRN: [id number] DOB: 17-Mar-1941 Today's Date: 04/02/2013 Time: 1010-1050 PT Time Calculation (min): 40 min  PT Assessment / Plan / Recommendation History of Present Illness    This is a 72 year old female who is here visiting from [place], today she was in the kitchen cooking when she fell. She hit her head and had a brief loss of consciousness. It was unwitnessed fall. Patient states she saw stars when she hit her head and on awakening she had nausea but no vomiting. She has a mild headache and continues to be dizzy since hitting her head. The patient identifies no precipitating causes. She denies tripping, denies chest pain, denies palpitations. Of note yesterday she was also sitting in her chair, she leaned over to pick up an object and fell out of the chair and hit her head.  While in the ER at med [facility] further questioning revealed a complaint of shortness of breath and left leg pain and swelling for the past 2-3 days. Workup revealed that the patient has a DVT and PE, we were called and a requested transfer was made. The patient reports decreased activity because of back problems, however, she does her ADLs, she does not use a cane or walker and she mobilizes around her house. She has a history of breast cancer, she's been cancer free for 7 years. Her last visit with her oncologist was in January, workup included mammogram and blood work. All of which were normal. The patient reports no altered mentation, no seizures.   Clinical Impression  Pt presents with s/s consistent with left posterior canal BPPV.  Treated with modified Epley maneuver and provided with post-maneuver education.  Plan to f/u for symptom resolution within next 24 hours and advance ambulatory mobility as able.  Recommend OPPT for follow up, preferably in clinic that treats vestibular impairments, unless symptoms are resolved with single Epley, in  which case pt may not need PT follow up.  Will update d/c recommendations as appropriate.  See below for details.  Thank you.    PT Assessment  Patient needs continued PT services    Follow Up Recommendations  Outpatient PT;Supervision - Intermittent    Does the patient have the potential to tolerate intense rehabilitation      Barriers to Discharge        Equipment Recommendations  None recommended by PT    Recommendations for Other Services     Frequency Min 3X/week    Precautions / Restrictions Precautions Precautions: Fall Precaution Comments: vestibular dysfunction   Pertinent Vitals/Pain Vertigo-related dizziness rated 2/10 at rest, up to 10/10 with left sidelying, decrease to <3/10 after 60 seconds or less.      Mobility  Bed Mobility Bed Mobility: Rolling Right;Rolling Left;Supine to Sit;Sit to Supine Rolling Right: 5: Supervision Rolling Left: 5: Supervision Supine to Sit: 4: Min assist Sit to Supine: 4: Min assist Details for Bed Mobility Assistance: assist to provide stability in sup/side>sit transition due to severe vertigo, especially with righti side to sit and sit to left side - see VRT exam results Transfers Transfers: Sit to Stand;Stand to Sit Sit to Stand: 4: Min guard Stand to Sit: 4: Min guard Details for Transfer Assistance: satndby assist for stabilty due to vertigo with head turns or leaning over Ambulation/Gait Ambulation/Gait Assistance: Not tested (comment) Stairs: No Wheelchair Mobility Wheelchair Mobility: No    Exercises     PT Diagnosis: Difficulty walking (vestibular dysfunction (BPPV left PC))  PT Problem List: Cardiopulmonary status limiting activity;Decreased knowledge of use of DME;Decreased mobility;Decreased balance;Decreased activity tolerance PT Treatment Interventions: Gait training;Functional mobility training;Therapeutic exercise;Therapeutic activities;Balance training;Neuromuscular re-education (vestibular rehab)     PT  Goals(Current goals can be found in the care plan section) Acute Rehab PT Goals Patient Stated Goal: no falls, no more dizziness PT Goal Formulation: With patient Time For Goal Achievement: 04/09/13 Potential to Achieve Goals: Good  Visit Information  Last PT Received On: 04/02/13 Assistance Needed: +1       Prior [place] expects to be discharged to:: Private residence Living Arrangements: Spouse/significant other Available Help at Discharge: Family Type of Home: House Home Access: Level entry [place]: One level Prior Function Level of [place]: Independent Communication Communication: No difficulties    Cognition  Cognition Arousal/Alertness: Awake/alert Behavior During Therapy: WFL for tasks assessed/performed Overall Cognitive Status: Within Functional Limits for tasks assessed    Extremity/Trunk Assessment Upper Extremity Assessment Upper Extremity Assessment: Overall WFL for tasks assessed Lower Extremity Assessment Lower Extremity Assessment: Overall WFL for tasks assessed   Balance Balance Balance Assessed: Yes Standardized Balance Assessment Standardized Balance Assessment: Vestibular Evaluation High Level Balance High Level Balance Comments: VESTIBULAR EVAL:  positive nystagmus and vertigo with sidelying test, performed modified Epley for left posterior canal, post repositioning instructions to pt and nursing.  Severe nausea, RN gave meds.  Pt should be reassess in next 24 hours for resolution.  End of Session PT - End of Session Activity Tolerance: Patient tolerated treatment well Patient left: in chair;with family/visitor present;with nursing/sitter in room;with call bell/phone within reach Nurse Communication: Mobility status;Precautions  GP     Herbie Drape 04/02/2013, 11:01 AM

## 2013-04-02 NOTE — Progress Notes (Signed)
PHARMACY FOLLOW UP NOTE   Pharmacy Consult for : Coumadin with Heparin bridging  Indication: PE, LLE DVT  Dosing Weight: 66.3 kg  Labs:  Recent Labs  03/31/13 1830 03/31/13 2047 04/01/13 0500 04/01/13 1344 04/01/13 2310 04/02/13 0535 04/02/13 0750  HGB 10.7*  --   --   --   --   --  10.4*  HCT 33.1*  --   --   --   --   --  31.7*  PLT 202  --   --   --   --   --  233  APTT  --  30  --   --   --   --   --   LABPROT  --  13.9 13.8  --   --  12.8  --   INR  --  1.09 1.08  --   --  0.98  --   HEPARINUNFRC  --   --  0.96* 0.72* 0.35 0.37  --   CREATININE 1.40*  --  1.05  --   --   --   --    Lab Results  Component Value Date   INR 0.98 04/02/2013   INR 1.08 04/01/2013   INR 1.09 03/31/2013    Estimated Creatinine Clearance: 46.9 ml/min (by C-G formula based on Cr of 1.05).  Pertinent Medications:  Scheduled:  . atorvastatin  20 mg Oral q1800  . cefTRIAXone (ROCEPHIN)  IV  1 g Intravenous Q24H  . DULoxetine  60 mg Oral Daily  . glyBURIDE  1.25 mg Oral Q breakfast  . insulin aspart  0-15 Units Subcutaneous TID WC  . insulin aspart  0-5 Units Subcutaneous QHS  . irbesartan  300 mg Oral Daily  . levothyroxine  100 mcg Oral QAC breakfast  . metoprolol  200 mg Oral Daily  . pantoprazole  40 mg Oral Daily  . sodium chloride  3 mL Intravenous Q12H  . spironolactone  25 mg Oral Daily  . verapamil  180 mg Oral Daily  . Warfarin - Pharmacist Dosing Inpatient   Does not apply q1800   Infusions:  . heparin 800 Units/hr (04/01/13 1601)   Assessment:  Patient continues on UFH for treatment of new B-PE's and LLE DVT.  Heparin infusing at 800 units/hr.  Heparin level 0.37 units/ml.  No bleeding complications noted  Coumadin to be started for long-term treatment.   Goal:  INR 2-3  Heparin level 0.3-0.7 units/ml   Plan: 1. Coumadin 7.5 mg today, first dose now. 2. Continue Heparin at 800 units/hr 3. Daily Heparin Levels, INR, CBC.  Monitor for bleeding complications.     Ayelet Gruenewald, Tereso Newcomer.D 04/02/2013, 11:16 AM

## 2013-04-03 LAB — PROTIME-INR
INR: 1.09 (ref 0.00–1.49)
Prothrombin Time: 13.9 seconds (ref 11.6–15.2)

## 2013-04-03 LAB — CBC
HCT: 31.6 % — ABNORMAL LOW (ref 36.0–46.0)
Hemoglobin: 10.4 g/dL — ABNORMAL LOW (ref 12.0–15.0)
WBC: 7.3 10*3/uL (ref 4.0–10.5)

## 2013-04-03 LAB — CARDIOLIPIN ANTIBODIES, IGG, IGM, IGA
Anticardiolipin IgA: 0 APL U/mL — ABNORMAL LOW (ref <22)
Anticardiolipin IgG: 0 GPL U/mL — ABNORMAL LOW (ref <23)
Anticardiolipin IgM: 0 MPL U/mL — ABNORMAL LOW (ref <11)

## 2013-04-03 LAB — GLUCOSE, CAPILLARY
Glucose-Capillary: 123 mg/dL — ABNORMAL HIGH (ref 70–99)
Glucose-Capillary: 209 mg/dL — ABNORMAL HIGH (ref 70–99)
Glucose-Capillary: 86 mg/dL (ref 70–99)

## 2013-04-03 MED ORDER — WARFARIN SODIUM 7.5 MG PO TABS
7.5000 mg | ORAL_TABLET | Freq: Once | ORAL | Status: AC
  Administered 2013-04-03: 7.5 mg via ORAL
  Filled 2013-04-03: qty 1

## 2013-04-03 MED ORDER — ENOXAPARIN SODIUM 80 MG/0.8ML ~~LOC~~ SOLN
80.0000 mg | Freq: Two times a day (BID) | SUBCUTANEOUS | Status: DC
  Administered 2013-04-03 – 2013-04-04 (×3): 80 mg via SUBCUTANEOUS
  Filled 2013-04-03 (×5): qty 0.8

## 2013-04-03 MED ORDER — FUROSEMIDE 20 MG PO TABS
20.0000 mg | ORAL_TABLET | Freq: Every day | ORAL | Status: DC
  Administered 2013-04-03 – 2013-04-04 (×2): 20 mg via ORAL
  Filled 2013-04-03 (×2): qty 1

## 2013-04-03 NOTE — Progress Notes (Signed)
[facility] PROGRESS NOTE  Amanda Short XTK:[id number] DOB: 1941/03/22 DOA: 03/31/2013 PCP: No primary provider on file.  Assessment/Plan: 1-Acute bilateral pulmonary embolism with left lower extremity DVT Doppler: Extensive thrombus throughout the left lower extremity extending  from the left common femoral vein to the calf. Hypercoagulable work: Protein C 121, lupus anticoagulant elevated, protein S 96, antithrombin 106.  ECHO normal EF, diastolic dysfunction grade one.   Will transition to Lovenox today. Day 3 bridge. INR at 1.09. Continue with Coumadin.  CT head negative for bleed.  2-Dizziness/ vertigo; Post trauma. Repeat CT head negative. Hold spironolactone.  PT evaluation. Will follow clinical improvement today.   3-Pyuria: day 2 ceftriaxone. Urine culture no growth. Will discontinue antibiotics.  4-Diabetes mellitus continue with glyburide. SSI.  5-Hypertension: metoprolol, verapamil. Will resume low dose lasix.  6-Thyroid disease; Continue with synthroid.  7-Hypomagnesemia: resolved, mg at 2.2.   Code Status: Full Family Communication: care discussed with husband who was at bedside.  Disposition Plan: remain inpatient. Home 9-19.    Consultants:  none  Procedures: Doppler: Extensive thrombus throughout the left lower extremity extending  from the left common femoral vein to the calf.   Antibiotics: Ceftriaxone 9-16  HPI/Subjective: She doesn't know how is the vertigo today. She denies dyspnea, chest pain. Vertigo was slightly better yesterday.   Objective: Filed Vitals:   04/03/13 0517  BP: 128/56  Pulse: 71  Temp: 98.1 F (36.7 C)  Resp: 18    Intake/Output Summary (Last 24 hours) at 04/03/13 0851 Last data filed at 04/03/13 0618  Gross per 24 hour  Intake 1596.4 ml  Output   1475 ml  Net  121.4 ml   Filed Weights   03/31/13 1826 03/31/13 2346  Weight: 81.647 kg (180 lb) 81.602 kg (179 lb 14.4 oz)    Exam:   General:  No  distress.   Cardiovascular: S 1, S 2 RRR  Respiratory: CTA  Abdomen: Bs presents, soft, nt  Musculoskeletal: left LE edema.   Data Reviewed: Basic Metabolic Panel:  Recent Labs Lab 03/31/13 1830 04/01/13 0500 04/03/13 0525  NA 136 136  --   K 4.7 4.5  --   CL 95* 98  --   CO2 28 22  --   GLUCOSE 145* 94  --   BUN 27* 20  --   CREATININE 1.40* 1.05  --   CALCIUM 8.2* 7.7*  --   MG  --  1.1* 2.2  PHOS  --  4.0  --    Liver Function Tests:  Recent Labs Lab 03/31/13 1830  AST 12  ALT 14  ALKPHOS 77  BILITOT 0.5  PROT 6.2  ALBUMIN 3.0*   No results found for this basename: LIPASE, AMYLASE,  in the last 168 hours No results found for this basename: AMMONIA,  in the last 168 hours CBC:  Recent Labs Lab 03/31/13 1830 04/02/13 0750 04/03/13 0525  WBC 10.8* 7.4 7.3  NEUTROABS 8.6*  --   --   HGB 10.7* 10.4* 10.4*  HCT 33.1* 31.7* 31.6*  MCV 96.2 94.3 93.5  PLT 202 233 275   Cardiac Enzymes:  Recent Labs Lab 03/31/13 1830  TROPONINI <0.30   BNP (last 3 results) No results found for this basename: PROBNP,  in the last 8760 hours CBG:  Recent Labs Lab 04/02/13 0730 04/02/13 1139 04/02/13 1526 04/02/13 2028 04/03/13 0755  GLUCAP 119* 104* 197* 115* 123*    Recent Results (from the past 240 hour(s))  URINE CULTURE  Status: None   Collection Time    03/31/13  8:06 PM      Result Value Range Status   Specimen Description URINE, CLEAN CATCH   Final   Special Requests NONE   Final   Culture  Setup Time     Final   Value: 04/01/2013 01:23     Performed at [facility]     Final   Value: NO GROWTH     Performed at [facility]   Culture     Final   Value: NO GROWTH     Performed at [facility]   Report Status 04/01/2013 FINAL   Final     Studies: Ct Head Wo Contrast  04/02/2013   CLINICAL DATA:  Head trauma. FOLLOW UP  EXAM: CT HEAD WITHOUT CONTRAST  TECHNIQUE: Contiguous axial images were  obtained from the base of the skull through the vertex without intravenous contrast.  COMPARISON:  03/31/2013  FINDINGS: Generalized atrophy. Chronic microvascular ischemic change in the white matter. No acute infarct apparent  Negative for intracranial hemorrhage. Negative for mass lesion.  Right parietal scalp hematoma. Negative for skull fracture.  IMPRESSION: No acute abnormality and no change from the prior CT.   Electronically Signed   By: Franchot Gallo M.D.   On: 04/02/2013 06:59    Scheduled Meds: . atorvastatin  20 mg Oral q1800  . DULoxetine  60 mg Oral Daily  . furosemide  20 mg Oral Daily  . glyBURIDE  1.25 mg Oral Q breakfast  . insulin aspart  0-15 Units Subcutaneous TID WC  . insulin aspart  0-5 Units Subcutaneous QHS  . levothyroxine  100 mcg Oral QAC breakfast  . metoprolol  200 mg Oral Daily  . pantoprazole  40 mg Oral Daily  . sodium chloride  3 mL Intravenous Q12H  . verapamil  180 mg Oral Daily  . warfarin  1 each Does not apply Once  . Warfarin - Pharmacist Dosing Inpatient   Does not apply q1800   Continuous Infusions: . sodium chloride 20 mL/hr (04/02/13 0700)  . heparin 900 Units/hr (04/03/13 6940)    Active Problems:   Acute pulmonary embolism   DVT (deep venous thrombosis)   HX: breast cancer   Concussion   Diabetes   Essential hypertension, benign   Unspecified hypothyroidism   OSA on CPAP    Time spent: 35 minutes.     Quantel Mcinturff  [facility] Pager [phone]. If 7PM-7AM, please contact night-coverage at [email or web address], password [email or web address] 04/03/2013, 8:51 AM  LOS: 3 days

## 2013-04-03 NOTE — Progress Notes (Signed)
Physical Therapy Treatment Patient Details Name: Amanda Short MRN: [id number] DOB: [birth year] Today's Date: 04/03/2013 Time: 3818-2993 PT Time Calculation (min): 20 min  PT Assessment / Plan / Recommendation  History of Present Illness 72 y.o. female admitted to [facility] on 03/31/13 s/p fall dx with DVT and PE.  She had a vestibular assessment and treatment for L posterior canal BPPV on 04/03/13.     PT Comments   Pt feeling much better today.  Testing still very mildly positive and pt staggering with head turns during gait.  She is safe to go home with husband's assist and will need outpatient PT follow up for vestibular/balance training.  [facility]    Follow Up Recommendations  Outpatient PT;Supervision - Intermittent (for vestibular and balance therapy)     Does the patient have the potential to tolerate intense rehabilitation    Yes  Barriers to Discharge   None      Equipment Recommendations  None recommended by PT    Recommendations for Other Services   None  Frequency Min 3X/week   Progress towards PT Goals Progress towards PT goals: Progressing toward goals  Plan Current plan remains appropriate    Precautions / Restrictions Precautions Precautions: Fall Precaution Comments: vestibular dysfunction   Pertinent Vitals/Pain See vitals flow sheet.     Mobility  Bed Mobility Rolling Right: 6: Modified independent (Device/Increase time);With rail Rolling Left: 6: Modified independent (Device/Increase time);With rail Supine to Sit: 6: Modified independent (Device/Increase time);With rails;HOB flat Sit to Supine: 6: Modified independent (Device/Increase time);HOB flat Details for Bed Mobility Assistance: pt using rail for rolling.  Transfers Transfers: Sit to Stand;Stand to Sit Sit to Stand: 4: Min guard;With upper extremity assist;From bed Stand to Sit: 4: Min guard;With upper extremity assist;To  chair/3-in-1 Details for Transfer Assistance: min guard assist for balance during transitions Ambulation/Gait Ambulation/Gait Assistance: 4: Min guard Ambulation Distance (Feet): 200 Feet Assistive device: None Ambulation/Gait Assistance Details: min guard assist for LOB during head turns looking L>looking R Gait Pattern: Step-through pattern (staggering with horizontal, but not vertical head turns)      PT Goals (current goals can now be found in the care plan section) Acute Rehab PT Goals Patient Stated Goal: no falls, no more dizziness  Visit Information  Last PT Received On: 04/03/13 Assistance Needed: +1 History of Present Illness: 72 y.o. female admitted to [facility] on 03/31/13 s/p fall dx with DVT and PE.  She had a vestibular assessment and treatment for L posterior canal BPPV on 04/03/13.      Subjective Data  Subjective: Pt reports significant improvement in symptoms today compared to yesterday.  Still some very mild symptoms with rolling left and with coming up from sitting from her left side.  Patient Stated Goal: no falls, no more dizziness   Cognition  Cognition Arousal/Alertness: Awake/alert Behavior During Therapy: WFL for tasks assessed/performed Overall Cognitive Status: Within Functional Limits for tasks assessed    Balance  Standardized Balance Assessment Standardized Balance Assessment: Vestibular Evaluation High Level Balance High Level Balance Comments: (-) bil dix hallpike today.  Mildly positive log roll test for very mild 5 sec symptoms when rolling to the left today, (-) log roll right.  Pt also had very mild symptoms when getting up from left side lying.  No visual nystagmus, and very mild symptoms, so no epley's preformed.    End of Session PT - End of Session Activity Tolerance: Patient  tolerated treatment well Patient left: in bed;with call bell/phone within reach;with family/visitor present     Wells Guiles B. [place], [place], DPT [phone]   04/03/2013,  5:31 PM

## 2013-04-03 NOTE — Progress Notes (Signed)
ANTICOAGULATION CONSULT NOTE - Follow Up Consult  Pharmacy Consult for heparin Indication: pulmonary embolus  Labs:  Recent Labs  03/31/13 1830  03/31/13 2047 04/01/13 0500  04/01/13 2310 04/02/13 0535 04/02/13 0750 04/03/13 0525  HGB 10.7*  --   --   --   --   --   --  10.4* 10.4*  HCT 33.1*  --   --   --   --   --   --  31.7* 31.6*  PLT 202  --   --   --   --   --   --  233 275  APTT  --   --  30  --   --   --   --   --   --   LABPROT  --   < > 13.9 13.8  --   --  12.8  --  13.9  INR  --   < > 1.09 1.08  --   --  0.98  --  1.09  HEPARINUNFRC  --   --   --  0.96*  < > 0.35 0.37  --  0.23*  CREATININE 1.40*  --   --  1.05  --   --   --   --   --   TROPONINI <0.30  --   --   --   --   --   --   --   --   < > = values in this interval not displayed.  Assessment: 72yo female subtherapeutic on heparin for PE.  Goal of Therapy:  Heparin level 0.3-0.7 units/ml   Plan:  Increase heparin to 900 units/hr F/u heparin level 6 hours after rate change  Excell Seltzer, PharmD 04/03/2013,6:14 AM

## 2013-04-03 NOTE — Progress Notes (Signed)
ANTICOAGULATION CONSULT NOTE - Follow Up Consult  Pharmacy Consult for Lovenox and warfarin Indication: pulmonary embolus and DVT  No Known Allergies  Patient Measurements: Height: 5' 1" (154.9 cm) Weight: 179 lb 14.4 oz (81.602 kg) IBW/kg (Calculated) : 47.8   Vital Signs: Temp: 98.1 F (36.7 C) (09/18 0517) BP: 128/56 mmHg (09/18 0517) Pulse Rate: 71 (09/18 0517)  Labs:  Recent Labs  03/31/13 1830  03/31/13 2047 04/01/13 0500  04/01/13 2310 04/02/13 0535 04/02/13 0750 04/03/13 0525  HGB 10.7*  --   --   --   --   --   --  10.4* 10.4*  HCT 33.1*  --   --   --   --   --   --  31.7* 31.6*  PLT 202  --   --   --   --   --   --  233 275  APTT  --   --  30  --   --   --   --   --   --   LABPROT  --   < > 13.9 13.8  --   --  12.8  --  13.9  INR  --   < > 1.09 1.08  --   --  0.98  --  1.09  HEPARINUNFRC  --   --   --  0.96*  < > 0.35 0.37  --  0.23*  CREATININE 1.40*  --   --  1.05  --   --   --   --   --   TROPONINI <0.30  --   --   --   --   --   --   --   --   < > = values in this interval not displayed.  Estimated Creatinine Clearance: 46.9 ml/min (by C-G formula based on Cr of 1.05).   Assessment: 71 YOF with PE and LLE DVT who is to transition from IV heparin to subq Lovenox in anticipation of discharge. Today is day #2 of overlap as she received her first dose of warfarin last evening. INR today is 1.09. SCr is ~1, CrCl ~49m/min. CBC is stable, no bleeding noted.  Goal of Therapy:  INR 2-3 Anti-Xa level 0.6-1.2 units/ml 4hrs after LMWH dose given Monitor platelets by anticoagulation protocol: Yes   Plan:  1. Discontinue heparin at 1000 2. Start Lovenox 161mkg (8019msubq Q12h at 1100 3. Communicated plan with RN- husband to learn how to administer injections 4. Warfarin 7.5mg76m x1 tonight 5. Daily PT/INR 6. CBC Q72h 7. Follow for signs/symptoms of bleeding  Sereen Schaff D. Mikhala Kenan, PharmD Clinical Pharmacist Pager: [phone] 10:07 AM

## 2013-04-03 NOTE — Care Management (Signed)
Care Manager did call [facility] and left message for MD Chou's RN to see if MD is accepting new patient's for lab draws. CM will await call back. Will f/u for labs outpatient. Bethena Roys, RN,BSN [phone]

## 2013-04-04 LAB — GLUCOSE, CAPILLARY: Glucose-Capillary: 146 mg/dL — ABNORMAL HIGH (ref 70–99)

## 2013-04-04 LAB — BASIC METABOLIC PANEL
CO2: 28 mEq/L (ref 19–32)
Calcium: 8.6 mg/dL (ref 8.4–10.5)
Chloride: 105 mEq/L (ref 96–112)
Sodium: 142 mEq/L (ref 135–145)

## 2013-04-04 LAB — PROTIME-INR: INR: 1.42 (ref 0.00–1.49)

## 2013-04-04 MED ORDER — ENOXAPARIN SODIUM 80 MG/0.8ML ~~LOC~~ SOLN: 80.0000 mg | Freq: Two times a day (BID) | SUBCUTANEOUS | Status: DC

## 2013-04-04 MED ORDER — ATORVASTATIN CALCIUM 20 MG PO TABS: 20.0000 mg | ORAL_TABLET | Freq: Every day | ORAL | Status: DC

## 2013-04-04 MED ORDER — MECLIZINE HCL 25 MG PO TABS: 25.0000 mg | ORAL_TABLET | Freq: Three times a day (TID) | ORAL | Status: DC | PRN

## 2013-04-04 MED ORDER — WARFARIN SODIUM 7.5 MG PO TABS: 7.5000 mg | ORAL_TABLET | Freq: Every day | ORAL | Status: DC

## 2013-04-04 NOTE — Plan of Care (Signed)
Problem: Consults Goal: Diagnosis - Venous Thromboembolism (VTE) Choose a selection  PE (Pulmonary Embolism) And DVT BLE

## 2013-04-04 NOTE — Discharge Summary (Signed)
Physician Discharge Summary  Amanda Short MWN:[id number] DOB: February 03, 1941 DOA: 03/31/2013  PCP: No primary provider on file.  Admit date: 03/31/2013 Discharge date: 04/04/2013  Time spent: 35 minutes  Recommendations for Outpatient Follow-up:  1. Need INR , adjust coumadin as needed. She will need to be on lovenox at least for 5 days and 24 hour after INR at goal.   Discharge Diagnoses:    Acute pulmonary embolism   DVT (deep venous thrombosis)   HX: breast cancer   Concussion   Diabetes   Essential hypertension, benign   Unspecified hypothyroidism   OSA on CPAP   Discharge Condition: stable  Diet recommendation: Heart Healthy  Filed Weights   03/31/13 1826 03/31/13 2346  Weight: 81.647 kg (180 lb) 81.602 kg (179 lb 14.4 oz)    History of present illness:  This is a 72 year old female who is here visiting from [place], today she was in the kitchen cooking when she fell. She hit her head and had a brief loss of consciousness. It was unwitnessed fall. Patient states she saw stars when she hit her head and on awakening she had nausea but no vomiting. She has a mild headache and continues to be dizzy since hitting her head. When her husband came home, he called 911 she was brought to the ER. The patient identifies no precipitating causes. She denies tripping, denies chest pain, denies palpitations. Of note yesterday she was also sitting in her chair, she leaned over to pick up an object and fell out of the chair and hit her head.  While in the ER at med center [facility] further questioning revealed a complaint of shortness of breath and left leg pain and swelling for the past 2-3 days. Workup revealed that the patient has a DVT and PE, we were called and a requested transfer was made. The patient reports decreased activity because of back problems, however, she does her ADLs, she does not use a cane or walker and she mobilizes around her house. She has a history of breast cancer,  she's been cancer free for 7 years. Her last visit with her oncologist was in January, workup included mammogram and blood work. All of which were normal. The patient reports no altered mentation, no seizures.   Hospital Course:  1-Acute bilateral pulmonary embolism with left lower extremity DVT  Doppler: Extensive thrombus throughout the left lower extremity extending  from the left common femoral vein to the calf.  Hypercoagulable work: Protein C 121, lupus anticoagulant elevated, protein S 96, antithrombin 106.  ECHO normal EF, diastolic dysfunction grade one.  Will transition to Lovenox today. Day 4 bridge. INR at 1.42.  Continue with Coumadin 7.5 mg daily.  CT head negative for bleed.  She will need to be on Lovenox at least for 5 days and 24 hours after INR at goal.   2-Dizziness/ vertigo; Post trauma. Repeat CT head negative. Hold  spironolactone. PT evaluation. Feeling better. Follow up with PT outpatient.   3-Pyuria: day 2 ceftriaxone. Urine culture no growth. Will discontinue antibiotics.  4-Diabetes mellitus continue with glyburide. SSI.  5-Hypertension: metoprolol, verapamil. W resume low dose lasix.  6-Thyroid disease; Continue with synthroid.  7-Hypomagnesemia: resolved, mg at 2.2.    Procedures: Doppler: Extensive thrombus throughout the left lower extremity extending  from the left common femoral vein to the calf.  ECHO:  Consultations:  None  Discharge Exam: Filed Vitals:   04/04/13 0529  BP: 139/69  Pulse: 72  Temp: 98.1 F (  36.7 C)  Resp:     General: No distress.  Cardiovascular: S 1, S 2 RRR Respiratory: CTA  Discharge Instructions  Discharge Orders   Future Orders Complete By Expires   Diet - low sodium heart healthy  As directed    Increase activity slowly  As directed        Medication List    STOP taking these medications       diphenhydrAMINE 25 MG tablet  Commonly known as:  BENADRYL     EDARBI 80 MG Tabs  Generic drug:   Azilsartan Medoxomil     simvastatin 40 MG tablet  Commonly known as:  ZOCOR     spironolactone 25 MG tablet  Commonly known as:  ALDACTONE      TAKE these medications       atorvastatin 20 MG tablet  Commonly known as:  LIPITOR  Take 1 tablet (20 mg total) by mouth daily at 6 PM.     clonazePAM 0.5 MG tablet  Commonly known as:  KLONOPIN  Take 0.5 mg by mouth daily.     DULoxetine 60 MG capsule  Commonly known as:  CYMBALTA  Take 60 mg by mouth daily.     enoxaparin 80 MG/0.8ML injection  Commonly known as:  LOVENOX  Inject 0.8 mLs (80 mg total) into the skin every 12 (twelve) hours.     furosemide 40 MG tablet  Commonly known as:  LASIX  Take 40 mg by mouth daily.     glyBURIDE 1.25 MG tablet  Commonly known as:  DIABETA  Take 1.25 mg by mouth daily with breakfast.     levothyroxine 100 MCG tablet  Commonly known as:  SYNTHROID, LEVOTHROID  Take 100 mcg by mouth daily before breakfast.     meclizine 25 MG tablet  Commonly known as:  ANTIVERT  Take 1 tablet (25 mg total) by mouth 3 (three) times daily as needed for dizziness or nausea.     metFORMIN 1000 MG tablet  Commonly known as:  GLUCOPHAGE  Take 500 mg by mouth 2 (two) times daily with a meal.     metoprolol 200 MG 24 hr tablet  Commonly known as:  TOPROL-XL  Take 100 mg by mouth daily.     omeprazole 40 MG capsule  Commonly known as:  PRILOSEC  Take 40 mg by mouth 2 (two) times daily.     verapamil 120 MG tablet  Commonly known as:  CALAN  Take 180 mg by mouth daily.     warfarin 7.5 MG tablet  Commonly known as:  COUMADIN  Take 1 tablet (7.5 mg total) by mouth daily.       No Known Allergies    The results of significant diagnostics from this hospitalization (including imaging, microbiology, ancillary and laboratory) are listed below for reference.    Significant Diagnostic Studies: Ct Head Wo Contrast  04/02/2013   CLINICAL DATA:  Head trauma. FOLLOW UP  EXAM: CT HEAD WITHOUT CONTRAST   TECHNIQUE: Contiguous axial images were obtained from the base of the skull through the vertex without intravenous contrast.  COMPARISON:  03/31/2013  FINDINGS: Generalized atrophy. Chronic microvascular ischemic change in the white matter. No acute infarct apparent  Negative for intracranial hemorrhage. Negative for mass lesion.  Right parietal scalp hematoma. Negative for skull fracture.  IMPRESSION: No acute abnormality and no change from the prior CT.   Electronically Signed   By: Franchot Gallo M.D.   On: 04/02/2013 06:59  Ct Head Wo Contrast  03/31/2013   CLINICAL DATA:  Fall with posterior head injury.  EXAM: CT HEAD WITHOUT CONTRAST  TECHNIQUE: Contiguous axial images were obtained from the base of the skull through the vertex without intravenous contrast.  COMPARISON:  None.  FINDINGS: There is some scalp soft tissue swelling over the right posterior parietal region. No associated skull fracture is seen. The brain shows small vessel disease and atrophy. The brain demonstrates no evidence of hemorrhage, infarction, edema, mass effect, extra-axial fluid collection, hydrocephalus or mass lesion.  IMPRESSION: Right posterior parietal scalp soft tissue injury. No acute intracranial findings.   Electronically Signed   By: Aletta Edouard   On: 03/31/2013 19:46   Ct Angio Chest Pe W/cm &/or Wo Cm  03/31/2013   CLINICAL DATA:  Shortness of breast, leg pain and history of breast carcinoma.  EXAM: CT ANGIOGRAPHY CHEST WITH CONTRAST  TECHNIQUE: Multidetector CT imaging of the chest was performed using the standard protocol during bolus administration of intravenous contrast. Multiplanar CT image reconstructions including MIPs were obtained to evaluate the vascular anatomy.  CONTRAST:  30m OMNIPAQUE IOHEXOL 350 MG/ML SOLN  COMPARISON:  None.  FINDINGS: Acute pulmonary embolism is identified with moderate clot burden on the right clot which is nonobstructive in the lower lobe, middle lobe and upper lobe . Some  clot also extends into the main right pulmonary artery. Small clot burden is present on the left with nonocclusive thrombus extending into lower lobe branches and lingular branches. No associated gross evidence of right heart strain or pulmonary edema.  Lung windows show no parenchymal consolidation, infarcts or pleural effusions. A calcified granuloma is present in the right lower lobe.  No pericardial fluid is identified. There is at least 1 focal area of calcified plaque is identified in the region of the LAD. No masses, enlarged lymph nodes or bony lesions are identified.  Review of the MIP images confirms the above findings.  IMPRESSION: Acute pulmonary embolism with moderate clot burden on the right and smaller clot burden on the left. Thrombus is nonocclusive with the most prominent thrombus in the right lower lobe.  CriticalValue/emergent results were called by telephone at the time of interpretation on 03/31/2013 at 7:55 PMto Dr. PMaryan Rued who verbally acknowledged these results.   Electronically Signed   By: GAletta Edouard  On: 03/31/2013 19:58   UKoreaVenous Img Lower Unilateral Left  03/31/2013   *RADIOLOGY REPORT*  Clinical Data: Left leg pain and swelling  LEFT LOWER EXTREMITY VENOUS DUPLEX ULTRASOUND  Technique:  Gray-scale sonography with graded compression, as well as color Doppler and duplex ultrasound, were performed to evaluate the deep venous system of the lower extremity from the level of the common femoral vein through the popliteal and proximal calf veins. Spectral Doppler was utilized to evaluate flow at rest and with distal augmentation maneuvers.  Comparison:  None.  Findings: The upper common femoral vein is compressible.  There is nonocclusive thrombus in the lower common femoral vein.   The femoral vein is nearly completely occluded and filled with thrombus.  The popliteal vein is noncompressible and occluded. Posterior tibial, anterior tibial, and peroneal veins are all occluded with  thrombus.  The greater saphenous vein is also noncompressible and occluded with thrombus.  IMPRESSION: Extensive thrombus throughout the left lower extremity extending from the left common femoral vein to the calf.   Original Report Authenticated By: AMarybelle Killings M.D.    Microbiology: Recent Results (from the past 240 hour(s))  URINE CULTURE     Status: None   Collection Time    03/31/13  8:06 PM      Result Value Range Status   Specimen Description URINE, CLEAN CATCH   Final   Special Requests NONE   Final   Culture  Setup Time     Final   Value: 04/01/2013 01:23     Performed at [facility]     Final   Value: NO GROWTH     Performed at [facility]   Culture     Final   Value: NO GROWTH     Performed at [facility]   Report Status 04/01/2013 FINAL   Final     Labs: Basic Metabolic Panel:  Recent Labs Lab 03/31/13 1830 04/01/13 0500 04/03/13 0525 04/04/13 0345  NA 136 136  --  142  K 4.7 4.5  --  4.8  CL 95* 98  --  105  CO2 28 22  --  28  GLUCOSE 145* 94  --  126*  BUN 27* 20  --  14  CREATININE 1.40* 1.05  --  0.90  CALCIUM 8.2* 7.7*  --  8.6  MG  --  1.1* 2.2  --   PHOS  --  4.0  --   --    Liver Function Tests:  Recent Labs Lab 03/31/13 1830  AST 12  ALT 14  ALKPHOS 77  BILITOT 0.5  PROT 6.2  ALBUMIN 3.0*   No results found for this basename: LIPASE, AMYLASE,  in the last 168 hours No results found for this basename: AMMONIA,  in the last 168 hours CBC:  Recent Labs Lab 03/31/13 1830 04/02/13 0750 04/03/13 0525  WBC 10.8* 7.4 7.3  NEUTROABS 8.6*  --   --   HGB 10.7* 10.4* 10.4*  HCT 33.1* 31.7* 31.6*  MCV 96.2 94.3 93.5  PLT 202 233 275   Cardiac Enzymes:  Recent Labs Lab 03/31/13 1830  TROPONINI <0.30   BNP: BNP (last 3 results) No results found for this basename: PROBNP,  in the last 8760 hours CBG:  Recent Labs Lab 04/03/13 0755 04/03/13 1136 04/03/13 1616 04/03/13 2054  04/04/13 0747  GLUCAP 123* 86 209* 86 146*       Signed:  Saharah Sherrow  [facility] 04/04/2013, 9:53 AM

## 2013-04-04 NOTE — Progress Notes (Signed)
ANTICOAGULATION CONSULT NOTE - Follow Up Consult  Pharmacy Consult for Lovenox and warfarin Indication: pulmonary embolus and DVT  No Known Allergies  Patient Measurements: Height: _0  (154.9 cm) Weight: 179 lb 14.4 oz (81.602 kg) IBW/kg (Calculated) : 47.8   Vital Signs: Temp: 98.1 F (36.7 C) (09/19 0529) Temp src: Oral (09/19 0529) BP: 139/69 mmHg (09/19 0529) Pulse Rate: 72 (09/19 0529)  Labs:  Recent Labs  04/01/13 2310 04/02/13 0535 04/02/13 0750 04/03/13 0525 04/04/13 0345  HGB  --   --  10.4* 10.4*  --   HCT  --   --  31.7* 31.6*  --   PLT  --   --  233 275  --   LABPROT  --  12.8  --  13.9 17.0*  INR  --  0.98  --  1.09 1.42  HEPARINUNFRC 0.35 0.37  --  0.23*  --   CREATININE  --   --   --   --  0.90    Estimated Creatinine Clearance: 54.7 ml/min (by C-G formula based on Cr of 0.9).   Assessment: 89 YOF with PE and LLE DVT who is now on Lovenox and warfarin, day #3/5 of mandatory bridge. INR today is 1.42. SCr is ~1, CrCl ~70m/min. CBC is stable, no bleeding noted. Discharge for today. Day 5/5 of mandatory Lovenox/warfarin bridge is Sunday 9/21- INR needs to be therapeutic in order for Lovenox to be stopped.  Goal of Therapy:  INR 2-3 Anti-Xa level 0.6-1.2 units/ml 4hrs after LMWH dose given Monitor platelets by anticoagulation protocol: Yes   Plan:  1. Discussed with Dr. RTyrell Antonio patient to go home on warfarin 7.572mdaily as well as Lovenox 8088mubq Q12h. 2. INR follow up hopefully Monday/Tuesday/Wednesday (whenever she can get in- PCP follow-up is being worked on) 3. She will need to continue Lovenox until INR can be checked. Though Sunday is the last day of mandatory bridging, need to confirm INR is therapeutic for Lovenox to be stopped.   Neftali Thurow D. Myleka Moncure, PharmD Clinical Pharmacist Pager: [phone] 10:00 AM

## 2013-04-04 NOTE — Plan of Care (Signed)
Problem: Consults Goal: Diagnosis - Venous Thromboembolism (VTE) Choose a selection  DVT (Deep Vein Thrombosis)

## 2015-07-21 ENCOUNTER — Emergency Department (HOSPITAL_BASED_OUTPATIENT_CLINIC_OR_DEPARTMENT_OTHER): Payer: Medicare Other

## 2015-07-21 ENCOUNTER — Inpatient Hospital Stay (HOSPITAL_BASED_OUTPATIENT_CLINIC_OR_DEPARTMENT_OTHER)
Admission: EM | Admit: 2015-07-21 | Discharge: 2015-07-23 | DRG: 638 | Disposition: A | Discharge status: Home / Self Care | Payer: Medicare Other | Attending: Internal Medicine | Admitting: Internal Medicine

## 2015-07-21 ENCOUNTER — Encounter (HOSPITAL_BASED_OUTPATIENT_CLINIC_OR_DEPARTMENT_OTHER): Payer: Self-pay

## 2015-07-21 DIAGNOSIS — M79671 Pain in right foot: Secondary | ICD-10-CM | POA: Diagnosis present

## 2015-07-21 DIAGNOSIS — G4733 Obstructive sleep apnea (adult) (pediatric): Secondary | ICD-10-CM | POA: Diagnosis present

## 2015-07-21 DIAGNOSIS — M1990 Unspecified osteoarthritis, unspecified site: Secondary | ICD-10-CM | POA: Diagnosis present

## 2015-07-21 DIAGNOSIS — E11628 Type 2 diabetes mellitus with other skin complications: Secondary | ICD-10-CM

## 2015-07-21 DIAGNOSIS — Z853 Personal history of malignant neoplasm of breast: Secondary | ICD-10-CM | POA: Diagnosis not present

## 2015-07-21 DIAGNOSIS — Z86718 Personal history of other venous thrombosis and embolism: Secondary | ICD-10-CM | POA: Diagnosis not present

## 2015-07-21 DIAGNOSIS — N179 Acute kidney failure, unspecified: Secondary | ICD-10-CM | POA: Diagnosis present

## 2015-07-21 DIAGNOSIS — E039 Hypothyroidism, unspecified: Secondary | ICD-10-CM | POA: Diagnosis present

## 2015-07-21 DIAGNOSIS — I5032 Chronic diastolic (congestive) heart failure: Secondary | ICD-10-CM | POA: Diagnosis present

## 2015-07-21 DIAGNOSIS — Z96642 Presence of left artificial hip joint: Secondary | ICD-10-CM | POA: Diagnosis present

## 2015-07-21 DIAGNOSIS — I110 Hypertensive heart disease with heart failure: Secondary | ICD-10-CM | POA: Diagnosis present

## 2015-07-21 DIAGNOSIS — Z96653 Presence of artificial knee joint, bilateral: Secondary | ICD-10-CM | POA: Diagnosis present

## 2015-07-21 DIAGNOSIS — L089 Local infection of the skin and subcutaneous tissue, unspecified: Secondary | ICD-10-CM

## 2015-07-21 DIAGNOSIS — Z7901 Long term (current) use of anticoagulants: Secondary | ICD-10-CM | POA: Diagnosis not present

## 2015-07-21 DIAGNOSIS — E1165 Type 2 diabetes mellitus with hyperglycemia: Secondary | ICD-10-CM | POA: Diagnosis present

## 2015-07-21 DIAGNOSIS — I10 Essential (primary) hypertension: Secondary | ICD-10-CM | POA: Diagnosis present

## 2015-07-21 DIAGNOSIS — L03115 Cellulitis of right lower limb: Secondary | ICD-10-CM | POA: Diagnosis present

## 2015-07-21 DIAGNOSIS — E119 Type 2 diabetes mellitus without complications: Secondary | ICD-10-CM

## 2015-07-21 DIAGNOSIS — Z79899 Other long term (current) drug therapy: Secondary | ICD-10-CM

## 2015-07-21 DIAGNOSIS — D649 Anemia, unspecified: Secondary | ICD-10-CM | POA: Diagnosis present

## 2015-07-21 DIAGNOSIS — I82409 Acute embolism and thrombosis of unspecified deep veins of unspecified lower extremity: Secondary | ICD-10-CM | POA: Diagnosis present

## 2015-07-21 DIAGNOSIS — Z7984 Long term (current) use of oral hypoglycemic drugs: Secondary | ICD-10-CM | POA: Diagnosis not present

## 2015-07-21 DIAGNOSIS — E860 Dehydration: Secondary | ICD-10-CM | POA: Diagnosis present

## 2015-07-21 DIAGNOSIS — Z87891 Personal history of nicotine dependence: Secondary | ICD-10-CM | POA: Diagnosis not present

## 2015-07-21 DIAGNOSIS — L03119 Cellulitis of unspecified part of limb: Secondary | ICD-10-CM

## 2015-07-21 DIAGNOSIS — E785 Hyperlipidemia, unspecified: Secondary | ICD-10-CM | POA: Diagnosis present

## 2015-07-21 HISTORY — DX: Type 2 diabetes mellitus without complications: E11.9

## 2015-07-21 HISTORY — DX: Obstructive sleep apnea (adult) (pediatric): G47.33

## 2015-07-21 HISTORY — DX: Type 2 diabetes mellitus with other skin complications: E11.628

## 2015-07-21 HISTORY — DX: Depression, unspecified: F32.A

## 2015-07-21 HISTORY — DX: Cellulitis of right lower limb: L03.115

## 2015-07-21 HISTORY — DX: Malignant neoplasm of unspecified site of left female breast: C50.912

## 2015-07-21 HISTORY — DX: Hyperlipidemia, unspecified: E78.5

## 2015-07-21 HISTORY — DX: Hypothyroidism, unspecified: E03.9

## 2015-07-21 HISTORY — DX: Gastro-esophageal reflux disease without esophagitis: K21.9

## 2015-07-21 HISTORY — DX: Major depressive disorder, single episode, unspecified: F32.9

## 2015-07-21 HISTORY — DX: Acute embolism and thrombosis of unspecified deep veins of unspecified lower extremity: I82.409

## 2015-07-21 HISTORY — DX: Local infection of the skin and subcutaneous tissue, unspecified: L08.9

## 2015-07-21 HISTORY — DX: Other complications of anesthesia, initial encounter: T88.59XA

## 2015-07-21 HISTORY — DX: Other pulmonary embolism without acute cor pulmonale: I26.99

## 2015-07-21 HISTORY — DX: Adverse effect of unspecified anesthetic, initial encounter: T41.45XA

## 2015-07-21 LAB — CBC WITH DIFFERENTIAL/PLATELET
BASOS ABS: 0.1 10*3/uL (ref 0.0–0.1)
BASOS PCT: 0 %
EOS ABS: 0.3 10*3/uL (ref 0.0–0.7)
Eosinophils Relative: 2 %
HEMATOCRIT: 36.5 % (ref 36.0–46.0)
HEMOGLOBIN: 11.8 g/dL — AB (ref 12.0–15.0)
Lymphocytes Relative: 16 %
Lymphs Abs: 2.5 10*3/uL (ref 0.7–4.0)
MCH: 30 pg (ref 26.0–34.0)
MCHC: 32.3 g/dL (ref 30.0–36.0)
MCV: 92.9 fL (ref 78.0–100.0)
MONOS PCT: 8 %
Monocytes Absolute: 1.2 10*3/uL — ABNORMAL HIGH (ref 0.1–1.0)
NEUTROS ABS: 11 10*3/uL — AB (ref 1.7–7.7)
NEUTROS PCT: 74 %
Platelets: 307 10*3/uL (ref 150–400)
RBC: 3.93 MIL/uL (ref 3.87–5.11)
RDW: 14.3 % (ref 11.5–15.5)
WBC: 15 10*3/uL — AB (ref 4.0–10.5)

## 2015-07-21 LAB — BASIC METABOLIC PANEL
ANION GAP: 10 (ref 5–15)
BUN: 19 mg/dL (ref 6–20)
CHLORIDE: 99 mmol/L — AB (ref 101–111)
CO2: 28 mmol/L (ref 22–32)
CREATININE: 1.02 mg/dL — AB (ref 0.44–1.00)
Calcium: 7.4 mg/dL — ABNORMAL LOW (ref 8.9–10.3)
GFR calc non Af Amer: 53 mL/min — ABNORMAL LOW (ref >60)
Glucose, Bld: 136 mg/dL — ABNORMAL HIGH (ref 65–99)
Potassium: 4.1 mmol/L (ref 3.5–5.1)
SODIUM: 137 mmol/L (ref 135–145)

## 2015-07-21 LAB — GLUCOSE, CAPILLARY: Glucose-Capillary: 107 mg/dL — ABNORMAL HIGH (ref 65–99)

## 2015-07-21 LAB — I-STAT CG4 LACTIC ACID, ED: Lactic Acid, Venous: 2.82 mmol/L (ref 0.5–2.0)

## 2015-07-21 LAB — LACTIC ACID, PLASMA: Lactic Acid, Venous: 0.7 mmol/L (ref 0.5–2.0)

## 2015-07-21 MED ORDER — ONDANSETRON HCL 4 MG PO TABS: 4.0000 mg | ORAL_TABLET | Freq: Four times a day (QID) | ORAL | Status: DC | PRN

## 2015-07-21 MED ORDER — OXYCODONE HCL 5 MG PO TABS
5.0000 mg | ORAL_TABLET | ORAL | Status: DC | PRN
  Administered 2015-07-22 – 2015-07-23 (×5): 5 mg via ORAL
  Filled 2015-07-21 (×5): qty 1

## 2015-07-21 MED ORDER — MORPHINE SULFATE (PF) 2 MG/ML IV SOLN
1.0000 mg | INTRAVENOUS | Status: DC | PRN
  Administered 2015-07-22 (×2): 1 mg via INTRAVENOUS
  Filled 2015-07-21 (×3): qty 1

## 2015-07-21 MED ORDER — PIPERACILLIN-TAZOBACTAM 3.375 G IVPB 30 MIN: 3.3750 g | Freq: Once | INTRAVENOUS | Status: DC

## 2015-07-21 MED ORDER — ACETAMINOPHEN 650 MG RE SUPP: 650.0000 mg | Freq: Four times a day (QID) | RECTAL | Status: DC | PRN

## 2015-07-21 MED ORDER — VANCOMYCIN HCL 10 G IV SOLR
1500.0000 mg | INTRAVENOUS | Status: DC
  Administered 2015-07-22: 1500 mg via INTRAVENOUS
  Filled 2015-07-21 (×2): qty 1500

## 2015-07-21 MED ORDER — GLYBURIDE 2.5 MG PO TABS
2.5000 mg | ORAL_TABLET | Freq: Every day | ORAL | Status: DC
  Administered 2015-07-22 – 2015-07-23 (×2): 2.5 mg via ORAL
  Filled 2015-07-21 (×3): qty 1

## 2015-07-21 MED ORDER — INSULIN ASPART 100 UNIT/ML ~~LOC~~ SOLN
0.0000 [IU] | Freq: Three times a day (TID) | SUBCUTANEOUS | Status: DC
  Administered 2015-07-22: 1 [IU] via SUBCUTANEOUS

## 2015-07-21 MED ORDER — TETANUS-DIPHTH-ACELL PERTUSSIS 5-2.5-18.5 LF-MCG/0.5 IM SUSP
0.5000 mL | Freq: Once | INTRAMUSCULAR | Status: AC
  Administered 2015-07-22: 0.5 mL via INTRAMUSCULAR
  Filled 2015-07-21: qty 0.5

## 2015-07-21 MED ORDER — VANCOMYCIN HCL IN DEXTROSE 1-5 GM/200ML-% IV SOLN
1000.0000 mg | Freq: Once | INTRAVENOUS | Status: AC
  Administered 2015-07-21: 1000 mg via INTRAVENOUS
  Filled 2015-07-21: qty 200

## 2015-07-21 MED ORDER — ATORVASTATIN CALCIUM 20 MG PO TABS
20.0000 mg | ORAL_TABLET | Freq: Every day | ORAL | Status: DC
  Administered 2015-07-22: 20 mg via ORAL
  Filled 2015-07-21: qty 1

## 2015-07-21 MED ORDER — ACETAMINOPHEN 325 MG PO TABS: 650.0000 mg | ORAL_TABLET | Freq: Four times a day (QID) | ORAL | Status: DC | PRN

## 2015-07-21 MED ORDER — SODIUM CHLORIDE 0.9 % IV SOLN
INTRAVENOUS | Status: AC
  Administered 2015-07-22: 01:00:00 via INTRAVENOUS

## 2015-07-21 MED ORDER — CLONAZEPAM 0.5 MG PO TABS
0.5000 mg | ORAL_TABLET | Freq: Every day | ORAL | Status: DC
  Administered 2015-07-21 – 2015-07-22 (×2): 0.5 mg via ORAL
  Filled 2015-07-21 (×2): qty 1

## 2015-07-21 MED ORDER — RIVAROXABAN 15 MG PO TABS
15.0000 mg | ORAL_TABLET | Freq: Every day | ORAL | Status: DC
Start: 2015-07-21 — End: 2015-07-23
  Administered 2015-07-21 – 2015-07-22 (×2): 15 mg via ORAL
  Filled 2015-07-21 (×2): qty 1

## 2015-07-21 MED ORDER — ONDANSETRON HCL 4 MG/2ML IJ SOLN: 4.0000 mg | Freq: Four times a day (QID) | INTRAMUSCULAR | Status: DC | PRN

## 2015-07-21 MED ORDER — DULOXETINE HCL 60 MG PO CPEP
60.0000 mg | ORAL_CAPSULE | Freq: Every day | ORAL | Status: DC
  Administered 2015-07-22 – 2015-07-23 (×2): 60 mg via ORAL
  Filled 2015-07-21 (×2): qty 1

## 2015-07-21 MED ORDER — HYDROCODONE-ACETAMINOPHEN 5-325 MG PO TABS
1.0000 | ORAL_TABLET | Freq: Once | ORAL | Status: AC
  Administered 2015-07-21: 1 via ORAL
  Filled 2015-07-21: qty 1

## 2015-07-21 MED ORDER — PIPERACILLIN-TAZOBACTAM 3.375 G IVPB 30 MIN
3.3750 g | Freq: Once | INTRAVENOUS | Status: AC
  Administered 2015-07-21: 3.375 g via INTRAVENOUS
  Filled 2015-07-21 (×2): qty 50

## 2015-07-21 MED ORDER — SODIUM CHLORIDE 0.9 % IV BOLUS (SEPSIS)
1000.0000 mL | Freq: Once | INTRAVENOUS | Status: AC
  Administered 2015-07-21: 1000 mL via INTRAVENOUS

## 2015-07-21 MED ORDER — LEVOTHYROXINE SODIUM 100 MCG PO TABS
100.0000 ug | ORAL_TABLET | Freq: Every day | ORAL | Status: DC
  Administered 2015-07-22 – 2015-07-23 (×2): 100 ug via ORAL
  Filled 2015-07-21 (×2): qty 1

## 2015-07-21 MED ORDER — VANCOMYCIN HCL IN DEXTROSE 1-5 GM/200ML-% IV SOLN: 1000.0000 mg | Freq: Once | INTRAVENOUS | Status: DC

## 2015-07-21 MED ORDER — PIPERACILLIN-TAZOBACTAM 3.375 G IVPB
3.3750 g | Freq: Three times a day (TID) | INTRAVENOUS | Status: DC
  Administered 2015-07-22 – 2015-07-23 (×5): 3.375 g via INTRAVENOUS
  Filled 2015-07-21 (×9): qty 50

## 2015-07-21 MED ORDER — VERAPAMIL HCL ER 180 MG PO TBCR
180.0000 mg | EXTENDED_RELEASE_TABLET | Freq: Two times a day (BID) | ORAL | Status: DC
  Administered 2015-07-21 – 2015-07-23 (×4): 180 mg via ORAL
  Filled 2015-07-21 (×8): qty 1

## 2015-07-21 MED ORDER — OXYCODONE HCL 5 MG PO TABS
5.0000 mg | ORAL_TABLET | Freq: Once | ORAL | Status: AC
  Administered 2015-07-21: 5 mg via ORAL
  Filled 2015-07-21: qty 1

## 2015-07-21 MED ORDER — PANTOPRAZOLE SODIUM 40 MG PO TBEC
40.0000 mg | DELAYED_RELEASE_TABLET | Freq: Every day | ORAL | Status: DC
  Administered 2015-07-22 – 2015-07-23 (×2): 40 mg via ORAL
  Filled 2015-07-21 (×2): qty 1

## 2015-07-21 MED ORDER — METOPROLOL SUCCINATE ER 100 MG PO TB24
100.0000 mg | ORAL_TABLET | Freq: Every day | ORAL | Status: DC
  Administered 2015-07-21 – 2015-07-22 (×2): 100 mg via ORAL
  Filled 2015-07-21 (×2): qty 1

## 2015-07-21 NOTE — ED Provider Notes (Signed)
CSN: [id number]     Arrival date & time 07/21/15  1105 History   First MD Initiated Contact with Patient 07/21/15 1318     Chief Complaint  Patient presents with  . Foot Pain     (Consider location/radiation/quality/duration/timing/severity/associated sxs/prior Treatment) HPI  75 year old female who presents with right foot pain starting on Monday. History of DM and HTN. Progressive swelling and redness overlying right foot with difficulty walking due to severe pain. No trauma, but developed abrasion to dorsum of foot prior from the strap of her sandle.  No fever but with chills, sweats, and mild nausea No chest pain, diffuculty breathing, abd pain. Mild hyperglycemia at home while foot swelling been onoging.     Past Medical History  Diagnosis Date  . Diabetes mellitus without complication ([place])   . Hypertension   . Thyroid disease     Hypothyroid  . Cancer [facility])     breast  . Arthritis    Past Surgical History  Procedure Laterality Date  . Joint replacement      bilateral knee, L hip  . Breast lumpectomy  left  . Abdominal hysterectomy    . Bunionectomy    . Cholecystectomy     Family History  Problem Relation Age of Onset  . Diabetes     Social History  Substance Use Topics  . Smoking status: Former Research scientist (life sciences)  . Smokeless tobacco: Never Used  . Alcohol Use: No   OB History    No data available     Review of Systems 10/14 systems reviewed and are negative other than those stated in the HPI   Allergies  Review of patient's allergies indicates no known allergies.  Home Medications   Prior to Admission medications   Medication Sig Start Date End Date Taking? Authorizing Provider  Rivaroxaban (XARELTO PO) Take by mouth.   Yes Historical Provider, MD  atorvastatin (LIPITOR) 20 MG tablet Take 1 tablet (20 mg total) by mouth daily at 6 PM. 04/04/13   Belkys A Regalado, MD  clonazePAM (KLONOPIN) 0.5 MG tablet Take 0.5 mg by mouth daily.    Historical Provider, MD   DULoxetine (CYMBALTA) 60 MG capsule Take 60 mg by mouth daily.    Historical Provider, MD  furosemide (LASIX) 40 MG tablet Take 40 mg by mouth daily.    Historical Provider, MD  glyBURIDE (DIABETA) 1.25 MG tablet Take 1.25 mg by mouth daily with breakfast.    Historical Provider, MD  levothyroxine (SYNTHROID, LEVOTHROID) 100 MCG tablet Take 100 mcg by mouth daily before breakfast.    Historical Provider, MD  metFORMIN (GLUCOPHAGE) 1000 MG tablet Take 500 mg by mouth 2 (two) times daily with a meal.     Historical Provider, MD  metoprolol (TOPROL-XL) 200 MG 24 hr tablet Take 100 mg by mouth daily.    Historical Provider, MD  omeprazole (PRILOSEC) 40 MG capsule Take 40 mg by mouth 2 (two) times daily.    Historical Provider, MD  verapamil (CALAN) 120 MG tablet Take 180 mg by mouth daily.    Historical Provider, MD   BP 104/62 mmHg  Pulse 69  Temp(Src) 98.1 F (36.7 C) (Oral)  Resp 20  Ht _0  (1.549 m)  Wt 178 lb (80.74 kg)  BMI 33.65 kg/m2  SpO2 95% Physical Exam Physical Exam  Nursing note and vitals reviewed. Constitutional: Well developed, well nourished, non-toxic, and in no acute distress Head: Normocephalic and atraumatic.  Mouth/Throat: Oropharynx is clear and moist.  Neck: Normal range of motion. Neck supple.  Cardiovascular: Normal rate and regular rhythm.  +2 DP pulses bilaterally Pulmonary/Chest: Effort normal and breath sounds normal.  Abdominal: Soft. There is no tenderness. There is no rebound and no guarding.  Musculoskeletal: Focused exam of the right foot: small abrasion to dorsum of the right foot. There is diffuse swelling of the foot with overlying erythema and warmth. No active drainage.  Neurological: Alert, no facial droop, fluent speech, sensation to light touch in tact in bilateral lower extremities, full strength in ankle dorsi/plantar flexion bilaterally Skin: Skin is warm and dry.  Psychiatric: Cooperative  ED Course  Procedures (including critical care  time) Labs Review Labs Reviewed  CBC WITH DIFFERENTIAL/PLATELET - Abnormal; Notable for the following:    WBC 15.0 (*)    Hemoglobin 11.8 (*)    Neutro Abs 11.0 (*)    Monocytes Absolute 1.2 (*)    All other components within normal limits  BASIC METABOLIC PANEL - Abnormal; Notable for the following:    Chloride 99 (*)    Glucose, Bld 136 (*)    Creatinine, Ser 1.02 (*)    Calcium 7.4 (*)    GFR calc non Af Amer 53 (*)    All other components within normal limits  I-STAT CG4 LACTIC ACID, ED - Abnormal; Notable for the following:    Lactic Acid, Venous 2.82 (*)    All other components within normal limits  I-STAT CG4 LACTIC ACID, ED    Imaging Review Dg Foot Complete Right  07/21/2015  CLINICAL DATA:  Right foot pain with swelling and redness for 3 days. Abrasion over the first metatarsal. EXAM: RIGHT FOOT COMPLETE - 3+ VIEW COMPARISON:  None. FINDINGS: There is no fracture or dislocation. There is a hallux valgus deformity with prominent bunion formation and arthritis at the first metatarsal phalangeal joint. No findings suggestive of osteomyelitis. Slight degenerative changes at the tarsal metatarsal joints. Posterior and plantar calcaneal enthesophytes. Soft tissue swelling over the dorsum of the foot and around the first metatarsal phalangeal joint. IMPRESSION: No acute osseous abnormality. Arthritic changes as described. No visible osteomyelitis. Soft tissue swelling. Electronically Signed   By: Lorriane Shire M.D.   On: 07/21/2015 12:06   I have personally reviewed and evaluated these images and lab results as part of my medical decision-making.   EKG Interpretation None      MDM   Final diagnoses:  Cellulitis in diabetic foot [facility])    75 year old female with history of diabetes who presents with progressive right foot pain, swelling, erythema and warmth over the course of the past several days. she is afebrile hemodynamically stable on presentation. Extremity is  neurovascularly intact. She has edema involving the right foot up to the ankle, with overlying erythema and warmth. Tender to touch over the dorsum of her foot as well. Small superficial abrasion noted to the dorsum, without drainage, and is likely nidus of of her cellulitis. X-ray of her foot with soft tissue swelling but no bony changes. Blood work is concerning for initial lactic acid of 2.8 and a leukocytosis of 15. Concern for early sepsis and she is started on IV fluids and IV antibiotics, including vancomycin and Zosyn. She is discussed with Dr. Karleen Hampshire from [facility] who will admit to [facility] for observation and ongoing management.    Forde Dandy, MD 07/21/15 [phone]

## 2015-07-21 NOTE — H&P (Signed)
[facility] History and Physical  Tashica Provencio JJK:[id number] DOB: April 27, 1941 DOA: 07/21/2015  Referring physician: Patient was transferred from [facility] [place]. PCP: No PCP Per Patient  Specialists: None.  Chief Complaint: Right foot pain.  HPI: Amanda Short is a 75 y.o. female with history of diabetes mellitus2, hypertension, DVT, hyperlipidemia, hypothyroidism presents to the ER because of pain in the right foot. Patient has been noticing increasing pain since Monday 2 days ago. Denies any trauma. Has noticed some small abrasion on the foot. On exam patient has mild swelling involving the right foot with warmth extending up to the toes. X-rays did not show any evidence of osteomyelitis. Patient admitted for further management of cellulitis. Lactic as was minimally elevated and patient was given fluid bolus.  Review of Systems: As presented in the history of presenting illness, rest negative.  Past Medical History  Diagnosis Date  . Diabetes mellitus without complication ([place])   . Hypertension   . Thyroid disease     Hypothyroid  . Cancer [facility])     breast  . Arthritis    Past Surgical History  Procedure Laterality Date  . Joint replacement      bilateral knee, L hip  . Breast lumpectomy  left  . Abdominal hysterectomy    . Bunionectomy    . Cholecystectomy     Social History:  reports that she has quit smoking. She has never used smokeless tobacco. She reports that she does not drink alcohol or use illicit drugs. Where does patient live at home. Can patient participate in ADLs? Yes.  No Known Allergies  Family History:  Family History  Problem Relation Age of Onset  . Diabetes    . Diabetes Mellitus II Sister       Prior to Admission medications   Medication Sig Start Date End Date Taking? Authorizing Provider  atorvastatin (LIPITOR) 20 MG tablet Take 1 tablet (20 mg total) by mouth daily at 6 PM. 04/04/13  Yes Belkys A Regalado, MD  clonazePAM  (KLONOPIN) 0.5 MG tablet Take 0.5 mg by mouth at bedtime.    Yes Historical Provider, MD  DULoxetine (CYMBALTA) 60 MG capsule Take 60 mg by mouth daily.   Yes Historical Provider, MD  furosemide (LASIX) 40 MG tablet Take 40 mg by mouth daily.   Yes Historical Provider, MD  glyBURIDE (DIABETA) 2.5 MG tablet Take 2.5 mg by mouth daily with breakfast.   Yes Historical Provider, MD  levothyroxine (SYNTHROID, LEVOTHROID) 100 MCG tablet Take 100 mcg by mouth daily before breakfast.   Yes Historical Provider, MD  metFORMIN (GLUCOPHAGE) 500 MG tablet Take 500-1,000 mg by mouth 2 (two) times daily with a meal. Take 1000 mg in the morning and 500 mg in the evening   Yes Historical Provider, MD  metoprolol (TOPROL-XL) 200 MG 24 hr tablet Take 100 mg by mouth at bedtime.    Yes Historical Provider, MD  omeprazole (PRILOSEC) 40 MG capsule Take 40 mg by mouth 2 (two) times daily.   Yes Historical Provider, MD  Rivaroxaban (XARELTO PO) Take 15 mg by mouth at bedtime.    Yes Historical Provider, MD  verapamil (VERELAN PM) 180 MG 24 hr capsule Take 180 mg by mouth 2 (two) times daily.   Yes Historical Provider, MD    Physical Exam: Filed Vitals:   07/21/15 1420 07/21/15 1421 07/21/15 1557 07/21/15 1818  BP: 123/60  104/62 150/70  Pulse: 70 69 69 80  Temp:   98.1 F (36.7  C) 97.9 F (36.6 C)  TempSrc:   Oral Oral  Resp: _0 Height:    _1  (1.549 m)  Weight:    80.287 kg (177 lb)  SpO2: 96% 96% 95% 93%     General:  Moderately built and nourished.  Eyes: Anicteric. No pallor.  ENT: No discharge from the ears eyes nose or mouth.  Neck: No mass felt.  Cardiovascular: S1-S2 heard.  Respiratory: No rhonchi or crepitations.  Abdomen: Soft nontender bowel sounds present.  Skin: Warm with erythema the right foot.  Musculoskeletal: Right foot is edematous.  Psychiatric: Appears normal.  Neurologic: Alert awake oriented to time place and person. Moves all extremities.  Labs on  Admission:  Basic Metabolic Panel:  Recent Labs Lab 07/21/15 1418  NA 137  K 4.1  CL 99*  CO2 28  GLUCOSE 136*  BUN 19  CREATININE 1.02*  CALCIUM 7.4*   Liver Function Tests: No results for input(s): AST, ALT, ALKPHOS, BILITOT, PROT, ALBUMIN in the last 168 hours. No results for input(s): LIPASE, AMYLASE in the last 168 hours. No results for input(s): AMMONIA in the last 168 hours. CBC:  Recent Labs Lab 07/21/15 1418  WBC 15.0*  NEUTROABS 11.0*  HGB 11.8*  HCT 36.5  MCV 92.9  PLT 307   Cardiac Enzymes: No results for input(s): CKTOTAL, CKMB, CKMBINDEX, TROPONINI in the last 168 hours.  BNP (last 3 results) No results for input(s): BNP in the last 8760 hours.  ProBNP (last 3 results) No results for input(s): PROBNP in the last 8760 hours.  CBG: No results for input(s): GLUCAP in the last 168 hours.  Radiological Exams on Admission: Dg Foot Complete Right  07/21/2015  CLINICAL DATA:  Right foot pain with swelling and redness for 3 days. Abrasion over the first metatarsal. EXAM: RIGHT FOOT COMPLETE - 3+ VIEW COMPARISON:  None. FINDINGS: There is no fracture or dislocation. There is a hallux valgus deformity with prominent bunion formation and arthritis at the first metatarsal phalangeal joint. No findings suggestive of osteomyelitis. Slight degenerative changes at the tarsal metatarsal joints. Posterior and plantar calcaneal enthesophytes. Soft tissue swelling over the dorsum of the foot and around the first metatarsal phalangeal joint. IMPRESSION: No acute osseous abnormality. Arthritic changes as described. No visible osteomyelitis. Soft tissue swelling. Electronically Signed   By: Lorriane Shire M.D.   On: 07/21/2015 12:06     Assessment/Plan Principal Problem:   Cellulitis of foot, right Active Problems:   DVT (deep venous thrombosis) (HCC)   Essential hypertension, benign   Diabetic foot infection ([place])   Diabetes mellitus type 2, controlled ([place])    Cellulitis of right foot   1. Cellulitis of the right foot - patient has been started on vancomycin and Zosyn which will be continued. Patient's lactic acid was minimally elevated and patient did receive normal saline bolus. At this time I will hold off Lasix and continue with gentle hydration for at least next 12 hours. Recheck lactic acid levels. Check uric acid levels to rule out gout. Tetanus vaccination ordered. 2. Acute renal failure - probably from dehydration. Holding Lasix and gently hydrating. 3. Diabetes mellitus type 2 - patient states her last hemoglobin A1c was 7. Closely follow CBGs with sliding scale coverage. Hold metformin while inpatient. Patient is on glyburide. 4. Hypothyroidism on Synthroid. 5. Hyperlipidemia on statins. 6. History of DVT and xarelto. 7. Anemia - follow CBC. 8. OSA on C Pap. 9. Diastolic CHF - holding off  Lasix due to mildly elevated creatinine and lactic acid at this time.   DVT Prophylaxis xarelto.  Code Status: Full code.  Family Communication: Discussed with patient's husband.  Disposition Plan: Admit to inpatient.    Rowyn Mustapha N. [facility] Pager [phone].  If 7PM-7AM, please contact night-coverage [email or web address] Password [facility] 07/21/2015, 9:49 PM

## 2015-07-21 NOTE — Care Management Obs Status (Signed)
[facility] NOTIFICATION   Patient Details  Name: Amanda Short MRN: [id number] Date of Birth: 07-12-1941   Medicare Observation Status Notification Given:  Yes    Guido Sander, RN 07/21/2015, 9:19 PM

## 2015-07-21 NOTE — Progress Notes (Signed)
ANTIBIOTIC CONSULT NOTE - INITIAL  Pharmacy Consult for Zosyn and vancomycin Indication: cellulitis  No Known Allergies  Patient Measurements: Height: 5' 1" (154.9 cm) Weight: 177 lb (80.287 kg) IBW/kg (Calculated) : 47.8  Vital Signs: Temp: 97.9 F (36.6 C) (01/04 1818) Temp Source: Oral (01/04 1818) BP: 150/70 mmHg (01/04 1818) Pulse Rate: 80 (01/04 1818) Intake/Output from previous day:   Intake/Output from this shift:    Labs:  Recent Labs  07/21/15 1418  WBC 15.0*  HGB 11.8*  PLT 307  CREATININE 1.02*   Estimated Creatinine Clearance: 46.4 mL/min (by C-G formula based on Cr of 1.02). No results for input(s): VANCOTROUGH, VANCOPEAK, VANCORANDOM, GENTTROUGH, GENTPEAK, GENTRANDOM, TOBRATROUGH, TOBRAPEAK, TOBRARND, AMIKACINPEAK, AMIKACINTROU, AMIKACIN in the last 72 hours.   Microbiology: No results found for this or any previous visit (from the past 720 hour(s)).  Medical History: Past Medical History  Diagnosis Date  . Diabetes mellitus without complication ([place])   . Hypertension   . Thyroid disease     Hypothyroid  . Cancer [facility])     breast  . Arthritis    Assessment: 75 yo F presents on 1/4 with foot pain. Has had progressive swelling and redness of R foot. No bony changes seen on Xray. Zosyn and vanc x 1 given in the ED. Pharmacy consulted to continue abx. Afebrile, WBC elevated at 15. SCr 1.02, CrCl ~49m/min. Will plan to start vancomycin a little earlier tomorrow as did not receive a full load dose.  Goal of Therapy:  Vancomycin trough level 10-15 mcg/ml  Resolution of infection  Plan:  Continue Zosyn 3.375 gm IV q8h (4 hour infusion) Start vancomycin 1.5g IV Q24 tomorrow morning Monitor clinical picture, renal function, VT prn F/U C&S, abx deescalation / LOT

## 2015-07-21 NOTE — ED Notes (Addendum)
Right foot pain, swelling x 2-3 days-denies injury-state she did have a break in the skin to top of foot where her slippers rubbed-pt has bandaid to foot-redness and selling noted-pt presents to triage in w/c-NAD

## 2015-07-22 ENCOUNTER — Encounter (HOSPITAL_COMMUNITY): Payer: Self-pay | Admitting: General Practice

## 2015-07-22 DIAGNOSIS — E11628 Type 2 diabetes mellitus with other skin complications: Secondary | ICD-10-CM | POA: Diagnosis not present

## 2015-07-22 DIAGNOSIS — L03115 Cellulitis of right lower limb: Secondary | ICD-10-CM

## 2015-07-22 DIAGNOSIS — I10 Essential (primary) hypertension: Secondary | ICD-10-CM

## 2015-07-22 LAB — BASIC METABOLIC PANEL
Anion gap: 11 (ref 5–15)
BUN: 9 mg/dL (ref 6–20)
CHLORIDE: 103 mmol/L (ref 101–111)
CO2: 26 mmol/L (ref 22–32)
CREATININE: 0.75 mg/dL (ref 0.44–1.00)
Calcium: 7.1 mg/dL — ABNORMAL LOW (ref 8.9–10.3)
GFR calc Af Amer: >60 mL/min (ref >60)
GFR calc non Af Amer: >60 mL/min (ref >60)
Glucose, Bld: 121 mg/dL — ABNORMAL HIGH (ref 65–99)
POTASSIUM: 3.9 mmol/L (ref 3.5–5.1)
Sodium: 140 mmol/L (ref 135–145)

## 2015-07-22 LAB — GLUCOSE, CAPILLARY
GLUCOSE-CAPILLARY: 84 mg/dL (ref 65–99)
GLUCOSE-CAPILLARY: 120 mg/dL — AB (ref 65–99)
Glucose-Capillary: 134 mg/dL — ABNORMAL HIGH (ref 65–99)
Glucose-Capillary: 113 mg/dL — ABNORMAL HIGH (ref 65–99)

## 2015-07-22 LAB — CBC
HEMATOCRIT: 33.5 % — AB (ref 36.0–46.0)
Hemoglobin: 10.7 g/dL — ABNORMAL LOW (ref 12.0–15.0)
MCH: 29.9 pg (ref 26.0–34.0)
MCHC: 31.9 g/dL (ref 30.0–36.0)
MCV: 93.6 fL (ref 78.0–100.0)
PLATELETS: 286 10*3/uL (ref 150–400)
RBC: 3.58 MIL/uL — AB (ref 3.87–5.11)
RDW: 14.4 % (ref 11.5–15.5)
WBC: 10.7 10*3/uL — ABNORMAL HIGH (ref 4.0–10.5)

## 2015-07-22 LAB — URIC ACID: Uric Acid, Serum: 7.3 mg/dL — ABNORMAL HIGH (ref 2.3–6.6)

## 2015-07-22 NOTE — Progress Notes (Signed)
[facility] PROGRESS NOTE  Amanda Short ONG:[id number] DOB: 1940/08/12 DOA: 07/21/2015 PCP: No PCP Per Patient  Assessment/Plan: 1. Right foot cellulitis. -Amanda Short presenting with complaints of swelling involving her right foot associate with erythema, pain, localized heat. -Symptoms likely secondary to cellulitis. Did not have fluctuance or discharge on physical examination.  -Continue empiric IV antibiotic therapy with vancomycin and Zosyn. -On 07/22/2015 patient clinical improvement. Lab showing downward trend in lactic acid from 2.8 on admission to 0.7.  2. Type 2 diabetes mellitus. -Blood sugars controlled, fluctuate between 84 and 107. -Continue glyburide 2.5 mg by mouth daily. Metformin held on admission. -Continue sliding scale coverage.  3.  Hypertension. -Blood pressures controlled -Continue metoprolol succinate 100 mg by mouth daily and verapamil 180 mg by mouth twice a day  4.  History of deep venous thrombosis. -Continue chronic anticoagulation with Xarelto  5.  History of chronic diastolic congestive heart failure -Last transthoracic echocardiogram performed on 04/02/2013 that showed preserved ejection fraction of 55%. Grade 1 diastolic dysfunction. -Lasix held on admission given concerns for dehydration.  6.  Hypothyroidism. -Plan to continue Synthroid 100 g by mouth daily  Code Status: Full code  Family Communication: I spoke to her husband was present at bedside  Disposition Plan: Continue empiric IV antibiotic therapy   Antibiotics:  Vancomycin  Zosyn  HPI/Subjective: Amanda Short is a pleasant 75 year old female with a past medical history of type 2 diabetes mellitus hypertension, admitted to the medicine service on 07/21/2015 initially presenting with complaints of right foot pain. Physical exam findings highly suggestive of cellulitis. Films did not reveal evidence of osteomyelitis. She was started on empiric IV antibiotic therapy  with vancomycin and Zosyn.  Objective: Filed Vitals:   07/22/15 0543 07/22/15 1500  BP: 115/55 137/62  Pulse: 73 89  Temp: 97.9 F (36.6 C) 98.1 F (36.7 C)  Resp: 18 18    Intake/Output Summary (Last 24 hours) at 07/22/15 1612 Last data filed at 07/22/15 0900  Gross per 24 hour  Intake    850 ml  Output    400 ml  Net    450 ml   Filed Weights   07/21/15 1116 07/21/15 1818  Weight: 80.74 kg (178 lb) 80.287 kg (177 lb)    Exam:   General:  Patient is awake and alert, no acute distress   Cardiovascular: Regular rate and rhythm normal S1-S2 no murmurs rubs or gallops   Respiratory: Normal respiratory effort   Abdomen: Soft nontender nondistended   Musculoskeletal: There is swelling involving her right foot extending to ankle. Patient reporting that this is improved compared to yesterday's exam. Did not appreciate significant erythema. No fluctuant masses noted nor was there evidence of discharge/purulence  Data Reviewed: Basic Metabolic Panel:  Recent Labs Lab 07/21/15 1418 07/22/15 0538  NA 137 140  K 4.1 3.9  CL 99* 103  CO2 28 26  GLUCOSE 136* 121*  BUN 19 9  CREATININE 1.02* 0.75  CALCIUM 7.4* 7.1*   Liver Function Tests: No results for input(s): AST, ALT, ALKPHOS, BILITOT, PROT, ALBUMIN in the last 168 hours. No results for input(s): LIPASE, AMYLASE in the last 168 hours. No results for input(s): AMMONIA in the last 168 hours. CBC:  Recent Labs Lab 07/21/15 1418 07/22/15 0538  WBC 15.0* 10.7*  NEUTROABS 11.0*  --   HGB 11.8* 10.7*  HCT 36.5 33.5*  MCV 92.9 93.6  PLT 307 286   Cardiac Enzymes: No results for input(s): CKTOTAL, CKMB, CKMBINDEX, TROPONINI in  the last 168 hours. BNP (last 3 results) No results for input(s): BNP in the last 8760 hours.  ProBNP (last 3 results) No results for input(s): PROBNP in the last 8760 hours.  CBG:  Recent Labs Lab 08/19/2015 2321 07/22/15 0757 07/22/15 1241  GLUCAP 107* 134* 84    No results  found for this or any previous visit (from the past 240 hour(s)).   Studies: Dg Foot Complete Right  Aug 19, 2015  CLINICAL DATA:  Right foot pain with swelling and redness for 3 days. Abrasion over the first metatarsal. EXAM: RIGHT FOOT COMPLETE - 3+ VIEW COMPARISON:  None. FINDINGS: There is no fracture or dislocation. There is a hallux valgus deformity with prominent bunion formation and arthritis at the first metatarsal phalangeal joint. No findings suggestive of osteomyelitis. Slight degenerative changes at the tarsal metatarsal joints. Posterior and plantar calcaneal enthesophytes. Soft tissue swelling over the dorsum of the foot and around the first metatarsal phalangeal joint. IMPRESSION: No acute osseous abnormality. Arthritic changes as described. No visible osteomyelitis. Soft tissue swelling. Electronically Signed   By: Lorriane Shire M.D.   On: 19-Aug-2015 12:06    Scheduled Meds: . atorvastatin  20 mg Oral q1800  . clonazePAM  0.5 mg Oral QHS  . DULoxetine  60 mg Oral Daily  . glyBURIDE  2.5 mg Oral Q breakfast  . insulin aspart  0-9 Units Subcutaneous TID WC  . levothyroxine  100 mcg Oral QAC breakfast  . metoprolol  100 mg Oral QHS  . pantoprazole  40 mg Oral Daily  . piperacillin-tazobactam (ZOSYN)  IV  3.375 g Intravenous Q8H  . Rivaroxaban  15 mg Oral Q supper  . vancomycin  1,500 mg Intravenous Q24H  . verapamil  180 mg Oral BID   Continuous Infusions:   Principal Problem:   Cellulitis of foot, right Active Problems:   DVT (deep venous thrombosis) (HCC)   Essential hypertension, benign   Diabetic foot infection ([place])   Diabetes mellitus type 2, controlled ([place])   Cellulitis of right foot    Time spent: 35 minutes    Kelvin Cellar  [facility] Pager [phone]. If 7PM-7AM, please contact night-coverage at [email or web address], password [facility] 07/22/2015, 4:12 PM  LOS: 1 day

## 2015-07-22 NOTE — Progress Notes (Signed)
RT note: Patient placed herself on her home CPAP and is resting comfortably at this time.

## 2015-07-22 NOTE — Care Management Note (Signed)
Case Management Note  Patient Details  Name: Amanda Short MRN: [id number] Date of Birth: [birth year]  Subjective/Objective:                    Action/Plan:  Initail UR completed  Expected Discharge Date:                  Expected Discharge Plan:  Home/Self Care  In-House Referral:     Discharge planning Services     Post Acute Care Choice:    Choice offered to:     DME Arranged:    DME Agency:     HH Arranged:    [place] Agency:     Status of Service:  In process, will continue to follow  Medicare Important Message Given:    Date Medicare IM Given:    Medicare IM give by:    Date Additional Medicare IM Given:    Additional Medicare Important Message give by:     If discussed at [place] of Stay Meetings, dates discussed:    Additional Comments:  Marilu Favre, RN 07/22/2015, 8:39 AM

## 2015-07-23 LAB — CBC
HCT: 32.7 % — ABNORMAL LOW (ref 36.0–46.0)
Hemoglobin: 10.4 g/dL — ABNORMAL LOW (ref 12.0–15.0)
MCH: 29.5 pg (ref 26.0–34.0)
MCHC: 31.8 g/dL (ref 30.0–36.0)
MCV: 92.9 fL (ref 78.0–100.0)
PLATELETS: 272 10*3/uL (ref 150–400)
RBC: 3.52 MIL/uL — AB (ref 3.87–5.11)
RDW: 14.6 % (ref 11.5–15.5)
WBC: 12.3 10*3/uL — ABNORMAL HIGH (ref 4.0–10.5)

## 2015-07-23 LAB — BASIC METABOLIC PANEL
Anion gap: 9 (ref 5–15)
BUN: 6 mg/dL (ref 6–20)
CO2: 28 mmol/L (ref 22–32)
Calcium: 7 mg/dL — ABNORMAL LOW (ref 8.9–10.3)
Chloride: 105 mmol/L (ref 101–111)
Creatinine, Ser: 0.78 mg/dL (ref 0.44–1.00)
GFR calc Af Amer: >60 mL/min (ref >60)
GLUCOSE: 137 mg/dL — AB (ref 65–99)
POTASSIUM: 3.9 mmol/L (ref 3.5–5.1)
Sodium: 142 mmol/L (ref 135–145)

## 2015-07-23 LAB — GLUCOSE, CAPILLARY
Glucose-Capillary: 114 mg/dL — ABNORMAL HIGH (ref 65–99)
Glucose-Capillary: 212 mg/dL — ABNORMAL HIGH (ref 65–99)

## 2015-07-23 MED ORDER — RIVAROXABAN 20 MG PO TABS: 20.0000 mg | ORAL_TABLET | Freq: Every day | ORAL | Status: DC

## 2015-07-23 MED ORDER — CEPHALEXIN 500 MG PO CAPS: 500.0000 mg | ORAL_CAPSULE | Freq: Four times a day (QID) | ORAL | Status: DC

## 2015-07-23 NOTE — Progress Notes (Signed)
Discussed discharge instructions with patient. Reviewed all medications with patient. Patient received Rx. Patient ready for discharge.

## 2015-07-23 NOTE — Care Management Important Message (Signed)
Important Message  Patient Details  Name: Amanda Short MRN: [id number] Date of Birth: [birth year]   Medicare Important Message Given:  Yes    Amanda Short [name] 07/23/2015, 2:45 PM

## 2015-07-23 NOTE — Discharge Summary (Addendum)
Physician Discharge Summary  Amanda Short ZOX:[id number] DOB: 1940-10-08 DOA: 07/21/2015  PCP: Pcp Not In System  Admit date: 07/21/2015 Discharge date: 07/23/2015  Time spent: 35 minutes  Recommendations for Outpatient Follow-up:  1. Please follow up right foot cellulitis, she was discharged on Keflex.    Discharge Diagnoses:  Principal Problem:   Cellulitis of foot, right Active Problems:   DVT (deep venous thrombosis) (HCC)   Essential hypertension, benign   Diabetic foot infection ([place])   Diabetes mellitus type 2, controlled ([place])   Cellulitis of right foot Lactic Acid of 2.82 likely secondary to dehydration   Discharge Condition: Stable  Diet recommendation: Heart Healthy  Filed Weights   07/21/15 1116 07/21/15 1818  Weight: 80.74 kg (178 lb) 80.287 kg (177 lb)    History of present illness:  Amanda Short is a 75 y.o. female with history of diabetes mellitus2, hypertension, DVT, hyperlipidemia, hypothyroidism presents to the ER because of pain in the right foot. Patient has been noticing increasing pain since Monday 2 days ago. Denies any trauma. Has noticed some small abrasion on the foot. On exam patient has mild swelling involving the right foot with warmth extending up to the toes. X-rays did not show any evidence of osteomyelitis. Patient admitted for further management of cellulitis. Lactic as was minimally elevated and patient was given fluid bolus.  Hospital Course:  Amanda Short is a pleasant 75 year old female with a past medical history of type 2 diabetes mellitus hypertension, admitted to the medicine service on 07/21/2015 initially presenting with complaints of right foot pain. Physical exam findings highly suggestive of cellulitis. Films did not reveal evidence of osteomyelitis. She was started on empiric IV antibiotic therapy with vancomycin and Zosyn   Right foot cellulitis. -Amanda Short presenting with complaints of swelling involving her right  foot associate with erythema, pain, localized heat. -Symptoms likely secondary to cellulitis. Did not have fluctuance or discharge on physical examination.  -Continue empiric IV antibiotic therapy with vancomycin and Zosyn. -On 07/22/2015 patient clinical improvement. Lab showing downward trend in lactic acid from 2.8 on admission to 0.7. -She discharged to her home on 07/23/2015 on Keflex. Plan for a total of 7 days of antimicrobial therapy  2. Type 2 diabetes mellitus. -She was discharged on her home regimen of Metformin and glyburide.   3. Hypertension. -Blood pressures controlled -Continue metoprolol succinate 100 mg by mouth daily and verapamil 180 mg by mouth twice a day  4. History of deep venous thrombosis. -Continue chronic anticoagulation with Xarelto  5. History of chronic diastolic congestive heart failure -Last transthoracic echocardiogram performed on 04/02/2013 that showed preserved ejection fraction of 55%. Grade 1 diastolic dysfunction. -Lasix during this hospitalization having concerns for dehydration.    Discharge Exam: Filed Vitals:   07/22/15 2153 07/23/15 0530  BP: 132/74 139/65  Pulse: 84 72  Temp: 98.7 F (37.1 C) 98.1 F (36.7 C)  Resp: 18 17     General: Patient is awake and alert, no acute distress   Cardiovascular: Regular rate and rhythm normal S1-S2 no murmurs rubs or gallops   Respiratory: Normal respiratory effort   Abdomen: Soft nontender nondistended   Musculoskeletal: Interval improvement in right foot cellulitis with decreased swelling and erythema  Discharge Instructions   Discharge Instructions    Call MD for:  difficulty breathing, headache or visual disturbances    Complete by:  As directed      Call MD for:  extreme fatigue    Complete by:  As  directed      Call MD for:  hives    Complete by:  As directed      Call MD for:  persistant dizziness or light-headedness    Complete by:  As directed      Call MD for:   persistant nausea and vomiting    Complete by:  As directed      Call MD for:  redness, tenderness, or signs of infection (pain, swelling, redness, odor or green/yellow discharge around incision site)    Complete by:  As directed      Call MD for:  severe uncontrolled pain    Complete by:  As directed      Call MD for:  temperature >100.4    Complete by:  As directed      Call MD for:    Complete by:  As directed      Diet - low sodium heart healthy    Complete by:  As directed      Increase activity slowly    Complete by:  As directed           Discharge Medication List as of 07/23/2015 12:31 PM    START taking these medications   Details  cephALEXin (KEFLEX) 500 MG capsule Take 1 capsule (500 mg total) by mouth 4 (four) times daily., Starting 07/23/2015, Until Discontinued, Print      CONTINUE these medications which have NOT CHANGED   Details  atorvastatin (LIPITOR) 20 MG tablet Take 1 tablet (20 mg total) by mouth daily at 6 PM., Starting 04/04/2013, Until Discontinued, Print    clonazePAM (KLONOPIN) 0.5 MG tablet Take 0.5 mg by mouth at bedtime. , Until Discontinued, Historical Med    DULoxetine (CYMBALTA) 60 MG capsule Take 60 mg by mouth daily., Until Discontinued, Historical Med    furosemide (LASIX) 40 MG tablet Take 40 mg by mouth daily., Until Discontinued, Historical Med    glyBURIDE (DIABETA) 2.5 MG tablet Take 2.5 mg by mouth daily with breakfast., Until Discontinued, Historical Med    levothyroxine (SYNTHROID, LEVOTHROID) 100 MCG tablet Take 100 mcg by mouth daily before breakfast., Until Discontinued, Historical Med    metFORMIN (GLUCOPHAGE) 500 MG tablet Take 500-1,000 mg by mouth 2 (two) times daily with a meal. Take 1000 mg in the morning and 500 mg in the evening, Until Discontinued, Historical Med    metoprolol (TOPROL-XL) 200 MG 24 hr tablet Take 100 mg by mouth at bedtime. , Until Discontinued, Historical Med    omeprazole (PRILOSEC) 40 MG capsule Take 40  mg by mouth 2 (two) times daily., Until Discontinued, Historical Med    Rivaroxaban (XARELTO PO) Take 15 mg by mouth at bedtime. , Until Discontinued, Historical Med    verapamil (VERELAN PM) 180 MG 24 hr capsule Take 180 mg by mouth 2 (two) times daily., Until Discontinued, Historical Med       No Known Allergies    The results of significant diagnostics from this hospitalization (including imaging, microbiology, ancillary and laboratory) are listed below for reference.    Significant Diagnostic Studies: Dg Foot Complete Right  2015-07-22  CLINICAL DATA:  Right foot pain with swelling and redness for 3 days. Abrasion over the first metatarsal. EXAM: RIGHT FOOT COMPLETE - 3+ VIEW COMPARISON:  None. FINDINGS: There is no fracture or dislocation. There is a hallux valgus deformity with prominent bunion formation and arthritis at the first metatarsal phalangeal joint. No findings suggestive of osteomyelitis. Slight degenerative changes at the  tarsal metatarsal joints. Posterior and plantar calcaneal enthesophytes. Soft tissue swelling over the dorsum of the foot and around the first metatarsal phalangeal joint. IMPRESSION: No acute osseous abnormality. Arthritic changes as described. No visible osteomyelitis. Soft tissue swelling. Electronically Signed   By: Lorriane Shire M.D.   On: 07/21/2015 12:06    Microbiology: No results found for this or any previous visit (from the past 240 hour(s)).   Labs: Basic Metabolic Panel:  Recent Labs Lab 07/21/15 1418 07/22/15 0538 07/23/15 0713  NA 137 140 142  K 4.1 3.9 3.9  CL 99* 103 105  CO2 _0 GLUCOSE 136* 121* 137*  BUN _1 CREATININE 1.02* 0.75 0.78  CALCIUM 7.4* 7.1* 7.0*   Liver Function Tests: No results for input(s): AST, ALT, ALKPHOS, BILITOT, PROT, ALBUMIN in the last 168 hours. No results for input(s): LIPASE, AMYLASE in the last 168 hours. No results for input(s): AMMONIA in the last 168 hours. CBC:  Recent  Labs Lab 07/21/15 1418 07/22/15 0538 07/23/15 0713  WBC 15.0* 10.7* 12.3*  NEUTROABS 11.0*  --   --   HGB 11.8* 10.7* 10.4*  HCT 36.5 33.5* 32.7*  MCV 92.9 93.6 92.9  PLT 307 286 272   Cardiac Enzymes: No results for input(s): CKTOTAL, CKMB, CKMBINDEX, TROPONINI in the last 168 hours. BNP: BNP (last 3 results) No results for input(s): BNP in the last 8760 hours.  ProBNP (last 3 results) No results for input(s): PROBNP in the last 8760 hours.  CBG:  Recent Labs Lab 07/22/15 1241 07/22/15 1625 07/22/15 2154 07/23/15 0750 07/23/15 1218  GLUCAP 84 113* 120* 114* 212*       Signed:  Kelvin Cellar MD  FACP  [facility] 07/23/2015, 4:50 PM

## 2015-07-23 NOTE — Care Management Note (Signed)
Case Management Note  Patient Details  Name: [name] MRN: [id number] Date of Birth: [birth year]  Subjective/Objective:                    Action/Plan:   Expected Discharge Date:                  Expected Discharge Plan:  Home/Self Care  In-House Referral:     Discharge planning Services     Post Acute Care Choice:    Choice offered to:     DME Arranged:  [place] rolling DME Agency:  [place]:    [facility] Agency:     Status of Service:  Completed, signed off  Medicare Important Message Given:    Date Medicare IM Given:    Medicare IM give by:    Date Additional Medicare IM Given:    Additional Medicare Important Message give by:     If discussed at [place] of Stay Meetings, dates discussed:    Additional Comments:  Marilu Favre, RN 07/23/2015, 12:53 PM

## 2016-04-20 DIAGNOSIS — H2513 Age-related nuclear cataract, bilateral: Secondary | ICD-10-CM

## 2016-04-20 DIAGNOSIS — H348112 Central retinal vein occlusion, right eye, stable: Secondary | ICD-10-CM | POA: Insufficient documentation

## 2016-04-20 HISTORY — DX: Central retinal vein occlusion, right eye, stable: H34.8112

## 2016-04-20 HISTORY — DX: Age-related nuclear cataract, bilateral: H25.13

## 2017-05-27 ENCOUNTER — Inpatient Hospital Stay (HOSPITAL_BASED_OUTPATIENT_CLINIC_OR_DEPARTMENT_OTHER)
Admission: EM | Admit: 2017-05-27 | Discharge: 2017-05-30 | DRG: 392 | Disposition: A | Discharge status: Home Health | Payer: Medicare Other | Attending: Internal Medicine | Admitting: Internal Medicine

## 2017-05-27 ENCOUNTER — Emergency Department (HOSPITAL_BASED_OUTPATIENT_CLINIC_OR_DEPARTMENT_OTHER): Payer: Medicare Other

## 2017-05-27 ENCOUNTER — Other Ambulatory Visit: Payer: Self-pay

## 2017-05-27 ENCOUNTER — Encounter (HOSPITAL_BASED_OUTPATIENT_CLINIC_OR_DEPARTMENT_OTHER): Payer: Self-pay | Admitting: Emergency Medicine

## 2017-05-27 DIAGNOSIS — Z833 Family history of diabetes mellitus: Secondary | ICD-10-CM

## 2017-05-27 DIAGNOSIS — J449 Chronic obstructive pulmonary disease, unspecified: Secondary | ICD-10-CM | POA: Diagnosis present

## 2017-05-27 DIAGNOSIS — K5732 Diverticulitis of large intestine without perforation or abscess without bleeding: Secondary | ICD-10-CM | POA: Diagnosis present

## 2017-05-27 DIAGNOSIS — Z96642 Presence of left artificial hip joint: Secondary | ICD-10-CM | POA: Diagnosis present

## 2017-05-27 DIAGNOSIS — Z923 Personal history of irradiation: Secondary | ICD-10-CM

## 2017-05-27 DIAGNOSIS — I10 Essential (primary) hypertension: Secondary | ICD-10-CM | POA: Diagnosis present

## 2017-05-27 DIAGNOSIS — E039 Hypothyroidism, unspecified: Secondary | ICD-10-CM | POA: Diagnosis present

## 2017-05-27 DIAGNOSIS — G43A1 Cyclical vomiting, intractable: Secondary | ICD-10-CM | POA: Diagnosis not present

## 2017-05-27 DIAGNOSIS — R1115 Cyclical vomiting syndrome unrelated to migraine: Secondary | ICD-10-CM

## 2017-05-27 DIAGNOSIS — Z90710 Acquired absence of both cervix and uterus: Secondary | ICD-10-CM | POA: Diagnosis not present

## 2017-05-27 DIAGNOSIS — Z87891 Personal history of nicotine dependence: Secondary | ICD-10-CM

## 2017-05-27 DIAGNOSIS — E876 Hypokalemia: Secondary | ICD-10-CM | POA: Diagnosis present

## 2017-05-27 DIAGNOSIS — R109 Unspecified abdominal pain: Secondary | ICD-10-CM | POA: Diagnosis not present

## 2017-05-27 DIAGNOSIS — K529 Noninfective gastroenteritis and colitis, unspecified: Secondary | ICD-10-CM | POA: Diagnosis not present

## 2017-05-27 DIAGNOSIS — Z7901 Long term (current) use of anticoagulants: Secondary | ICD-10-CM | POA: Diagnosis not present

## 2017-05-27 DIAGNOSIS — R9431 Abnormal electrocardiogram [ECG] [EKG]: Secondary | ICD-10-CM

## 2017-05-27 DIAGNOSIS — Z86711 Personal history of pulmonary embolism: Secondary | ICD-10-CM | POA: Diagnosis not present

## 2017-05-27 DIAGNOSIS — Z79899 Other long term (current) drug therapy: Secondary | ICD-10-CM | POA: Diagnosis not present

## 2017-05-27 DIAGNOSIS — E278 Other specified disorders of adrenal gland: Secondary | ICD-10-CM | POA: Diagnosis present

## 2017-05-27 DIAGNOSIS — G4733 Obstructive sleep apnea (adult) (pediatric): Secondary | ICD-10-CM | POA: Diagnosis present

## 2017-05-27 DIAGNOSIS — Z853 Personal history of malignant neoplasm of breast: Secondary | ICD-10-CM

## 2017-05-27 DIAGNOSIS — I959 Hypotension, unspecified: Secondary | ICD-10-CM | POA: Diagnosis present

## 2017-05-27 DIAGNOSIS — N182 Chronic kidney disease, stage 2 (mild): Secondary | ICD-10-CM | POA: Diagnosis not present

## 2017-05-27 DIAGNOSIS — E785 Hyperlipidemia, unspecified: Secondary | ICD-10-CM | POA: Diagnosis present

## 2017-05-27 DIAGNOSIS — Z86718 Personal history of other venous thrombosis and embolism: Secondary | ICD-10-CM | POA: Diagnosis not present

## 2017-05-27 DIAGNOSIS — K219 Gastro-esophageal reflux disease without esophagitis: Secondary | ICD-10-CM | POA: Diagnosis present

## 2017-05-27 DIAGNOSIS — Z7984 Long term (current) use of oral hypoglycemic drugs: Secondary | ICD-10-CM

## 2017-05-27 DIAGNOSIS — Z79890 Hormone replacement therapy (postmenopausal): Secondary | ICD-10-CM | POA: Diagnosis not present

## 2017-05-27 DIAGNOSIS — E119 Type 2 diabetes mellitus without complications: Secondary | ICD-10-CM | POA: Diagnosis present

## 2017-05-27 DIAGNOSIS — R0902 Hypoxemia: Secondary | ICD-10-CM | POA: Diagnosis present

## 2017-05-27 DIAGNOSIS — Z9981 Dependence on supplemental oxygen: Secondary | ICD-10-CM

## 2017-05-27 DIAGNOSIS — Z6833 Body mass index (BMI) 33.0-33.9, adult: Secondary | ICD-10-CM

## 2017-05-27 DIAGNOSIS — E860 Dehydration: Secondary | ICD-10-CM | POA: Diagnosis not present

## 2017-05-27 DIAGNOSIS — R1110 Vomiting, unspecified: Secondary | ICD-10-CM | POA: Diagnosis present

## 2017-05-27 DIAGNOSIS — Z9049 Acquired absence of other specified parts of digestive tract: Secondary | ICD-10-CM

## 2017-05-27 DIAGNOSIS — R110 Nausea: Secondary | ICD-10-CM

## 2017-05-27 DIAGNOSIS — I272 Pulmonary hypertension, unspecified: Secondary | ICD-10-CM | POA: Diagnosis present

## 2017-05-27 DIAGNOSIS — Z96653 Presence of artificial knee joint, bilateral: Secondary | ICD-10-CM | POA: Diagnosis present

## 2017-05-27 DIAGNOSIS — R935 Abnormal findings on diagnostic imaging of other abdominal regions, including retroperitoneum: Secondary | ICD-10-CM | POA: Diagnosis not present

## 2017-05-27 DIAGNOSIS — G43A0 Cyclical vomiting, not intractable: Secondary | ICD-10-CM | POA: Diagnosis not present

## 2017-05-27 DIAGNOSIS — E1122 Type 2 diabetes mellitus with diabetic chronic kidney disease: Secondary | ICD-10-CM | POA: Diagnosis not present

## 2017-05-27 DIAGNOSIS — K2970 Gastritis, unspecified, without bleeding: Secondary | ICD-10-CM | POA: Diagnosis present

## 2017-05-27 HISTORY — DX: Abnormal electrocardiogram (ECG) (EKG): R94.31

## 2017-05-27 HISTORY — DX: Hypocalcemia: E83.51

## 2017-05-27 HISTORY — DX: Noninfective gastroenteritis and colitis, unspecified: K52.9

## 2017-05-27 LAB — CBC WITH DIFFERENTIAL/PLATELET
Basophils Absolute: 0 10*3/uL (ref 0.0–0.1)
Basophils Relative: 0 %
Eosinophils Absolute: 0 10*3/uL (ref 0.0–0.7)
Eosinophils Relative: 0 %
HEMATOCRIT: 37.6 % (ref 36.0–46.0)
HEMOGLOBIN: 12.4 g/dL (ref 12.0–15.0)
LYMPHS ABS: 1.2 10*3/uL (ref 0.7–4.0)
Lymphocytes Relative: 8 %
MCH: 31.6 pg (ref 26.0–34.0)
MCHC: 33 g/dL (ref 30.0–36.0)
MCV: 95.7 fL (ref 78.0–100.0)
MONOS PCT: 6 %
Monocytes Absolute: 0.8 10*3/uL (ref 0.1–1.0)
NEUTROS PCT: 86 %
Neutro Abs: 12.7 10*3/uL — ABNORMAL HIGH (ref 1.7–7.7)
Platelets: 282 10*3/uL (ref 150–400)
RBC: 3.93 MIL/uL (ref 3.87–5.11)
RDW: 13.9 % (ref 11.5–15.5)
WBC: 14.8 10*3/uL — ABNORMAL HIGH (ref 4.0–10.5)

## 2017-05-27 LAB — URINALYSIS, MICROSCOPIC (REFLEX)

## 2017-05-27 LAB — COMPREHENSIVE METABOLIC PANEL
ALK PHOS: 77 U/L (ref 38–126)
ALT: 13 U/L — ABNORMAL LOW (ref 14–54)
AST: 37 U/L (ref 15–41)
Albumin: 3.8 g/dL (ref 3.5–5.0)
Anion gap: 15 (ref 5–15)
BILIRUBIN TOTAL: 1.1 mg/dL (ref 0.3–1.2)
BUN: 13 mg/dL (ref 6–20)
CALCIUM: 5.9 mg/dL — AB (ref 8.9–10.3)
CO2: 32 mmol/L (ref 22–32)
Chloride: 86 mmol/L — ABNORMAL LOW (ref 101–111)
Creatinine, Ser: 0.67 mg/dL (ref 0.44–1.00)
GFR calc Af Amer: >60 mL/min (ref >60)
GFR calc non Af Amer: >60 mL/min (ref >60)
Glucose, Bld: 199 mg/dL — ABNORMAL HIGH (ref 65–99)
Potassium: 3 mmol/L — ABNORMAL LOW (ref 3.5–5.1)
Sodium: 133 mmol/L — ABNORMAL LOW (ref 135–145)
TOTAL PROTEIN: 6.4 g/dL — AB (ref 6.5–8.1)

## 2017-05-27 LAB — URINALYSIS, ROUTINE W REFLEX MICROSCOPIC
Bilirubin Urine: NEGATIVE
GLUCOSE, UA: NEGATIVE mg/dL
HGB URINE DIPSTICK: NEGATIVE
Ketones, ur: >80 mg/dL — AB
Leukocytes, UA: NEGATIVE
Nitrite: NEGATIVE
PH: 7 (ref 5.0–8.0)
PROTEIN: 100 mg/dL — AB
Specific Gravity, Urine: 1.02 (ref 1.005–1.030)

## 2017-05-27 LAB — MAGNESIUM: MAGNESIUM: 0.2 mg/dL — AB (ref 1.7–2.4)

## 2017-05-27 LAB — PHOSPHORUS: Phosphorus: 5.6 mg/dL — ABNORMAL HIGH (ref 2.5–4.6)

## 2017-05-27 LAB — CBG MONITORING, ED: Glucose-Capillary: 186 mg/dL — ABNORMAL HIGH (ref 65–99)

## 2017-05-27 LAB — GLUCOSE, CAPILLARY: Glucose-Capillary: 151 mg/dL — ABNORMAL HIGH (ref 65–99)

## 2017-05-27 LAB — TSH: TSH: 2.394 u[IU]/mL (ref 0.350–4.500)

## 2017-05-27 MED ORDER — SODIUM CHLORIDE 0.9 % IV SOLN
INTRAVENOUS | Status: DC
  Administered 2017-05-27 – 2017-05-29 (×3): via INTRAVENOUS

## 2017-05-27 MED ORDER — INSULIN ASPART 100 UNIT/ML ~~LOC~~ SOLN
0.0000 [IU] | Freq: Every day | SUBCUTANEOUS | Status: DC
  Administered 2017-05-29: 2 [IU] via SUBCUTANEOUS

## 2017-05-27 MED ORDER — ACETAMINOPHEN 325 MG PO TABS: 650.0000 mg | ORAL_TABLET | Freq: Four times a day (QID) | ORAL | Status: DC | PRN

## 2017-05-27 MED ORDER — POTASSIUM CHLORIDE 10 MEQ/100ML IV SOLN
10.0000 meq | Freq: Once | INTRAVENOUS | Status: DC
  Filled 2017-05-27: qty 100

## 2017-05-27 MED ORDER — METRONIDAZOLE IN NACL 5-0.79 MG/ML-% IV SOLN
500.0000 mg | Freq: Three times a day (TID) | INTRAVENOUS | Status: DC
  Filled 2017-05-27: qty 100

## 2017-05-27 MED ORDER — KETOROLAC TROMETHAMINE 15 MG/ML IJ SOLN: 15.0000 mg | Freq: Four times a day (QID) | INTRAMUSCULAR | Status: DC | PRN

## 2017-05-27 MED ORDER — BOOST / RESOURCE BREEZE PO LIQD
1.0000 | Freq: Three times a day (TID) | ORAL | Status: DC
  Administered 2017-05-27 – 2017-05-28 (×2): 1 via ORAL

## 2017-05-27 MED ORDER — CEFTRIAXONE SODIUM 1 G IJ SOLR: 1.0000 g | INTRAMUSCULAR | Status: DC

## 2017-05-27 MED ORDER — ONDANSETRON 4 MG PO TBDP
4.0000 mg | ORAL_TABLET | Freq: Once | ORAL | Status: AC
  Administered 2017-05-27: 4 mg via ORAL
  Filled 2017-05-27: qty 1

## 2017-05-27 MED ORDER — SODIUM CHLORIDE 0.9 % IV SOLN
1.0000 g | Freq: Once | INTRAVENOUS | Status: AC
  Administered 2017-05-27: 1 g via INTRAVENOUS
  Filled 2017-05-27: qty 10

## 2017-05-27 MED ORDER — MAGNESIUM SULFATE 2 GM/50ML IV SOLN
2.0000 g | Freq: Once | INTRAVENOUS | Status: AC
  Administered 2017-05-27: 2 g via INTRAVENOUS
  Filled 2017-05-27: qty 50

## 2017-05-27 MED ORDER — DULOXETINE HCL 60 MG PO CPEP
60.0000 mg | ORAL_CAPSULE | Freq: Every day | ORAL | Status: DC
  Administered 2017-05-27 – 2017-05-30 (×4): 60 mg via ORAL
  Filled 2017-05-27 (×4): qty 1

## 2017-05-27 MED ORDER — METOCLOPRAMIDE HCL 5 MG/ML IJ SOLN
10.0000 mg | Freq: Once | INTRAMUSCULAR | Status: AC
  Administered 2017-05-27: 10 mg via INTRAVENOUS
  Filled 2017-05-27: qty 2

## 2017-05-27 MED ORDER — SODIUM CHLORIDE 0.9 % IV BOLUS (SEPSIS)
250.0000 mL | Freq: Once | INTRAVENOUS | Status: DC
Start: 2017-05-27 — End: 2017-05-30

## 2017-05-27 MED ORDER — RIVAROXABAN 15 MG PO TABS
15.0000 mg | ORAL_TABLET | Freq: Every day | ORAL | Status: DC
  Administered 2017-05-28 – 2017-05-29 (×2): 15 mg via ORAL
  Filled 2017-05-27 (×2): qty 1

## 2017-05-27 MED ORDER — CIPROFLOXACIN IN D5W 400 MG/200ML IV SOLN: 400.0000 mg | Freq: Two times a day (BID) | INTRAVENOUS | Status: DC

## 2017-05-27 MED ORDER — SPIRONOLACTONE 25 MG PO TABS
50.0000 mg | ORAL_TABLET | Freq: Every day | ORAL | Status: DC
  Administered 2017-05-27 – 2017-05-30 (×4): 50 mg via ORAL
  Filled 2017-05-27 (×3): qty 2

## 2017-05-27 MED ORDER — DEXTROSE 5 % IV SOLN
2.0000 g | INTRAVENOUS | Status: DC
Start: 2017-05-27 — End: 2017-05-30
  Administered 2017-05-28 – 2017-05-29 (×2): 2 g via INTRAVENOUS
  Filled 2017-05-27 (×4): qty 2

## 2017-05-27 MED ORDER — METRONIDAZOLE IN NACL 5-0.79 MG/ML-% IV SOLN
500.0000 mg | Freq: Three times a day (TID) | INTRAVENOUS | Status: DC
  Administered 2017-05-28 – 2017-05-30 (×7): 500 mg via INTRAVENOUS
  Filled 2017-05-27 (×9): qty 100

## 2017-05-27 MED ORDER — METOPROLOL SUCCINATE ER 100 MG PO TB24
100.0000 mg | ORAL_TABLET | Freq: Every day | ORAL | Status: DC
  Administered 2017-05-28 – 2017-05-29 (×3): 100 mg via ORAL
  Filled 2017-05-27 (×3): qty 1

## 2017-05-27 MED ORDER — INSULIN ASPART 100 UNIT/ML ~~LOC~~ SOLN
0.0000 [IU] | Freq: Three times a day (TID) | SUBCUTANEOUS | Status: DC
  Administered 2017-05-28: 3 [IU] via SUBCUTANEOUS
  Administered 2017-05-28 – 2017-05-29 (×2): 2 [IU] via SUBCUTANEOUS
  Administered 2017-05-29 – 2017-05-30 (×2): 5 [IU] via SUBCUTANEOUS
  Administered 2017-05-30: 2 [IU] via SUBCUTANEOUS

## 2017-05-27 MED ORDER — ACETAMINOPHEN 650 MG RE SUPP: 650.0000 mg | Freq: Four times a day (QID) | RECTAL | Status: DC | PRN

## 2017-05-27 MED ORDER — FEBUXOSTAT 40 MG PO TABS
40.0000 mg | ORAL_TABLET | Freq: Every day | ORAL | Status: DC
  Administered 2017-05-27 – 2017-05-30 (×4): 40 mg via ORAL
  Filled 2017-05-27 (×4): qty 1

## 2017-05-27 MED ORDER — PANTOPRAZOLE SODIUM 40 MG PO TBEC
40.0000 mg | DELAYED_RELEASE_TABLET | Freq: Every day | ORAL | Status: DC
  Administered 2017-05-27 – 2017-05-30 (×4): 40 mg via ORAL
  Filled 2017-05-27 (×4): qty 1

## 2017-05-27 MED ORDER — IOPAMIDOL (ISOVUE-300) INJECTION 61%
100.0000 mL | Freq: Once | INTRAVENOUS | Status: AC | PRN
  Administered 2017-05-27: 100 mL via INTRAVENOUS

## 2017-05-27 MED ORDER — SODIUM CHLORIDE 0.9 % IV SOLN
INTRAVENOUS | Status: DC
  Administered 2017-05-27: 1000 mL via INTRAVENOUS

## 2017-05-27 MED ORDER — MONTELUKAST SODIUM 10 MG PO TABS
10.0000 mg | ORAL_TABLET | Freq: Every day | ORAL | Status: DC
  Administered 2017-05-28 – 2017-05-29 (×3): 10 mg via ORAL
  Filled 2017-05-27 (×3): qty 1

## 2017-05-27 MED ORDER — POTASSIUM CHLORIDE CRYS ER 20 MEQ PO TBCR
40.0000 meq | EXTENDED_RELEASE_TABLET | Freq: Two times a day (BID) | ORAL | Status: AC
  Administered 2017-05-28 (×2): 40 meq via ORAL
  Filled 2017-05-27 (×3): qty 2

## 2017-05-27 MED ORDER — POTASSIUM CHLORIDE 10 MEQ/100ML IV SOLN: 10.0000 meq | Freq: Once | INTRAVENOUS | Status: DC

## 2017-05-27 MED ORDER — LEVOTHYROXINE SODIUM 100 MCG PO TABS
100.0000 ug | ORAL_TABLET | Freq: Every day | ORAL | Status: DC
  Administered 2017-05-27 – 2017-05-30 (×3): 100 ug via ORAL
  Filled 2017-05-27 (×4): qty 1

## 2017-05-27 NOTE — ED Triage Notes (Addendum)
Pt started taking cialis a few weeks ago. Reports dizziness, N/V since beginning the medication. Pt stopped taking it 2 days ago but symptoms persist. Also reports hx of vertigo. EMS reports hypotension on scene at 74/p. Pt had 537m saline bolus and 487mzofran enroute.

## 2017-05-27 NOTE — ED Notes (Signed)
Critical calcium 5.9-Dr. Sabra Heck aware.

## 2017-05-27 NOTE — ED Notes (Signed)
Pt unsteady in attempting to transfer from bed to wheelchair. Gait belt used x1 MAX assistance to stand and pivot to and from the bathroom and bed. Pt c/o nausea and dizziness. Pt usually ambulates at home, sometimes using walker unassisted. RN made aware.

## 2017-05-27 NOTE — H&P (Addendum)
History and Physical    Amanda Short TGG:[id number] DOB: 03-09-1941 DOA: 05/27/2017  PCP: System, Pcp Not In  Patient coming from: Home  Chief Complaint: Diarrhea, abd pain, hypotension  HPI: Amanda Short is a 76 y.o. female with medical history significant of breast cancer s/p lumpectomy and radiation in 2006, hx of DVT and PE on chronic xarelto, HTN, HLD, hypothyroid, DM2, recently diagnosed pulm hypertension who presented to [facility] initially with increased weakness, dizziness. In the field, patient was found to be hypotensive with sbp in the 70's, improved with IVF. Patient was transported to the ED where pt was found to have a Ca of 5.9, K of 3.0 with QTc prolongation on ekg. Patient was given 1gm of IV Ca and transferred to [facility] for further work up.   On further questioning, patient reports increased nausea, early satiety, diarrhea, abd cramping shortly after starting Cialis for pulmonary HTN one month ago. Patient was advised to stop Cialis by her providers however symptoms persisted. Patient reports having "watery" diarrhea that has since improved two days prior to admission. Currently, patient continues with nausea and markedly poor PO intake. Patient denies fevers, chills, wt changes (loss or gain), sick contacts.  Review of Systems:  Review of Systems  Constitutional: Positive for malaise/fatigue. Negative for chills, fever and weight loss.  HENT: Negative for congestion, ear discharge, ear pain and nosebleeds.   Eyes: Negative for double vision, photophobia and pain.  Respiratory: Positive for shortness of breath. Negative for hemoptysis, sputum production and wheezing.   Cardiovascular: Negative for palpitations, orthopnea and claudication.  Gastrointestinal: Positive for abdominal pain, diarrhea, nausea and vomiting. Negative for blood in stool and melena.  Genitourinary: Negative for frequency, hematuria and urgency.  Musculoskeletal: Negative for back pain, joint pain and  neck pain.  Neurological: Positive for weakness. Negative for tingling, tremors, sensory change, seizures and loss of consciousness.  Psychiatric/Behavioral: Negative for hallucinations, memory loss and substance abuse. The patient is not nervous/anxious.     Past Medical History:  Diagnosis Date  . Arthritis    "back, hands" (07/22/2015)  . Cancer of left breast ([place]) 2006   S/P lumpectomy  . Complication of anesthesia    "brief breathing problem at surgery center in ~ 2005 when I had gallbladder OR"  . Depression   . DVT (deep venous thrombosis) ([place]) 03/2013   LLE  . GERD (gastroesophageal reflux disease)   . Hyperlipidemia   . Hypertension   . Hypothyroidism   . OSA treated with BiPAP   . Pulmonary embolism ([place]) 03/2013  . Thyroid disease    Hypothyroid  . Type II diabetes mellitus ([place])     Past Surgical History:  Procedure Laterality Date  . ABDOMINAL HYSTERECTOMY  1970s  . BREAST BIOPSY Left 2006  . BREAST LUMPECTOMY Left 2006  . BUNIONECTOMY WITH HAMMERTOE RECONSTRUCTION Left   . JOINT REPLACEMENT    . LAPAROSCOPIC CHOLECYSTECTOMY  ~ 2005  . TOTAL HIP ARTHROPLASTY Left 2010  . TOTAL KNEE ARTHROPLASTY Bilateral 2005-2006     reports that she has quit smoking. Her smoking use included cigarettes. She has a 30.00 pack-year smoking history. she has never used smokeless tobacco. She reports that she does not drink alcohol or use drugs.  Allergies  Allergen Reactions  . Allopurinol     Family History  Problem Relation Age of Onset  . Diabetes Unknown   . Diabetes Mellitus II Sister     Prior to Admission medications   Medication Sig Start Date  End Date Taking? Authorizing Provider  atorvastatin (LIPITOR) 20 MG tablet Take 1 tablet (20 mg total) by mouth daily at 6 PM. 04/04/13   Regalado, Belkys A, MD  cephALEXin (KEFLEX) 500 MG capsule Take 1 capsule (500 mg total) by mouth 4 (four) times daily. 07/23/15   Kelvin Cellar, MD  clonazePAM (KLONOPIN) 0.5 MG tablet  Take 0.5 mg by mouth at bedtime.     [provider]  DULoxetine (CYMBALTA) 60 MG capsule Take 60 mg by mouth daily.    [provider]  furosemide (LASIX) 40 MG tablet Take 40 mg by mouth daily.    [provider]  glyBURIDE (DIABETA) 2.5 MG tablet Take 2.5 mg by mouth daily with breakfast.    [provider]  levothyroxine (SYNTHROID, LEVOTHROID) 100 MCG tablet Take 100 mcg by mouth daily before breakfast.    [provider]  metFORMIN (GLUCOPHAGE) 500 MG tablet Take 500-1,000 mg by mouth 2 (two) times daily with a meal. Take 1000 mg in the morning and 500 mg in the evening    [provider]  metoprolol (TOPROL-XL) 200 MG 24 hr tablet Take 100 mg by mouth at bedtime.     [provider]  omeprazole (PRILOSEC) 40 MG capsule Take 40 mg by mouth 2 (two) times daily.    [provider]  Rivaroxaban (XARELTO PO) Take 15 mg by mouth at bedtime.     [provider]  verapamil (VERELAN PM) 180 MG 24 hr capsule Take 180 mg by mouth 2 (two) times daily.    [provider]    Physical Exam: Vitals:   05/27/17 1320 05/27/17 1447 05/27/17 1500 05/27/17 1548  BP: 120/62 140/75 140/71 133/67  Pulse:  63 63 66  Resp: (!) 21 18 (!) 21 18  Temp:      TempSrc:      SpO2:  96% 91% 96%  Weight:        Constitutional: NAD, calm, comfortable Vitals:   05/27/17 1320 05/27/17 1447 05/27/17 1500 05/27/17 1548  BP: 120/62 140/75 140/71 133/67  Pulse:  63 63 66  Resp: (!) 21 18 (!) 21 18  Temp:      TempSrc:      SpO2:  96% 91% 96%  Weight:       Eyes: PERRL, lids and conjunctivae normal ENMT: Mucous membranes are moist. Posterior pharynx clear of any exudate or lesions.Normal dentition.  Neck: normal, supple, no masses, no thyromegaly Respiratory: clear to auscultation bilaterally, no wheezing, no crackles. Normal respiratory effort. No accessory muscle use.  Cardiovascular: Regular rate and rhythm, no murmurs /  rubs / gallops. No extremity edema. 2+ pedal pulses. No carotid bruits.  Abdomen: no tenderness, no masses palpated. No hepatosplenomegaly. Bowel sounds positive.  Musculoskeletal: no clubbing / cyanosis. No joint deformity upper and lower extremities. Good ROM, no contractures. Normal muscle tone.  Skin: no rashes, lesions, ulcers. No induration Neurologic: CN 2-12 grossly intact. Sensation intact, DTR normal. Strength 5/5 in all 4.  Psychiatric: Normal judgment and insight. Alert and oriented x 3. Normal mood.    Labs on Admission: I have personally reviewed following labs and imaging studies  CBC: Recent Labs  Lab 05/27/17 1316  WBC 14.8*  NEUTROABS 12.7*  HGB 12.4  HCT 37.6  MCV 95.7  PLT 161   Basic Metabolic Panel: Recent Labs  Lab 05/27/17 1316  NA 133*  K 3.0*  CL 86*  CO2 32  GLUCOSE 199*  BUN 13  CREATININE 0.67  CALCIUM 5.9*   GFR: CrCl cannot be calculated (Unknown ideal weight.). Liver Function Tests: Recent Labs  Lab 05/27/17 1316  AST 37  ALT 13*  ALKPHOS 77  BILITOT 1.1  PROT 6.4*  ALBUMIN 3.8   No results for input(s): LIPASE, AMYLASE in the last 168 hours. No results for input(s): AMMONIA in the last 168 hours. Coagulation Profile: No results for input(s): INR, PROTIME in the last 168 hours. Cardiac Enzymes: No results for input(s): CKTOTAL, CKMB, CKMBINDEX, TROPONINI in the last 168 hours. BNP (last 3 results) No results for input(s): PROBNP in the last 8760 hours. HbA1C: No results for input(s): HGBA1C in the last 72 hours. CBG: Recent Labs  Lab 05/27/17 1225  GLUCAP 186*   Lipid Profile: No results for input(s): CHOL, HDL, LDLCALC, TRIG, CHOLHDL, LDLDIRECT in the last 72 hours. Thyroid Function Tests: No results for input(s): TSH, T4TOTAL, FREET4, T3FREE, THYROIDAB in the last 72 hours. Anemia Panel: No results for input(s): VITAMINB12, FOLATE, FERRITIN, TIBC, IRON, RETICCTPCT in the last 72 hours. Urine analysis:      Component Value Date/Time   COLORURINE YELLOW 05/27/2017 1410   APPEARANCEUR CLEAR 05/27/2017 1410   LABSPEC 1.020 05/27/2017 1410   PHURINE 7.0 05/27/2017 1410   GLUCOSEU NEGATIVE 05/27/2017 1410   HGBUR NEGATIVE 05/27/2017 1410   BILIRUBINUR NEGATIVE 05/27/2017 1410   KETONESUR >80 (A) 05/27/2017 1410   PROTEINUR 100 (A) 05/27/2017 1410   UROBILINOGEN 0.2 03/31/2013 2006   NITRITE NEGATIVE 05/27/2017 1410   LEUKOCYTESUR NEGATIVE 05/27/2017 1410   Sepsis Labs: !!!!!!!!!!!!!!!!!!!!!!!!!!!!!!!!!!!!!!!!!!!! _0 (procalcitonin:4,lacticidven:4) )No results found for this or any previous visit (from the past 240 hour(s)).   Radiological Exams on Admission: CT scan personally reviewed Ct Abdomen Pelvis W Contrast  Result Date: 05/27/2017 CLINICAL DATA:  Nausea vomiting for 4 days.  Mid abdominal pain. EXAM: CT ABDOMEN AND PELVIS WITH CONTRAST TECHNIQUE: Multidetector CT imaging of the abdomen and pelvis was performed using the standard protocol following bolus administration of intravenous contrast. CONTRAST:  150m ISOVUE-300 IOPAMIDOL (ISOVUE-300) INJECTION 61% COMPARISON:  None. FINDINGS: Lower chest: No acute abnormality. Rim calcified mass within the left breast, query postsurgical. Hepatobiliary: No focal liver abnormality is seen. Status post cholecystectomy. No biliary dilatation. Pancreas: Unremarkable. No pancreatic ductal dilatation or surrounding inflammatory changes. Spleen: Normal in size without focal abnormality. Adrenals/Urinary Tract: Intermediate density 1.9 cm right adrenal nodule. Kidneys are normal, without renal calculi, focal lesion, or hydronephrosis. Bladder is unremarkable. Stomach/Bowel: Mild mucosal thickening of the distal gastric body, image 41/92, sequence 2. No evidence of small-bowel obstruction. Normal appendix. Extensive colonic diverticulosis, particularly of the sigmoid colon. Mucosal thickening of the sigmoid colon may represent an element of  diverticulitis. Vascular/Lymphatic: Aortic atherosclerosis. No enlarged abdominal or pelvic lymph nodes. Reproductive: Status post hysterectomy. No adnexal masses. Other: No abdominal wall hernia or abnormality. No abdominopelvic ascites. Musculoskeletal: Multilevel osteoarthritic changes of the spine. Left total hip arthroplasty. IMPRESSION: Indeterminate 1.9 cm right adrenal nodule. If further evaluation is clinically desired, abdominal CT or MRI, adrenal protocol should be followed. Circumferential mucosal thickening of the distal gastric body. This may represent inflammatory changes/ hypertrophy of the gastric mucosa. Infiltrating mass however is on the differential diagnosis. Extensive colonic diverticulosis with probable sigmoid diverticulitis. Please correlate to results of colonoscopy to exclude underlying mass. Electronically Signed   By: DFidela SalisburyM.D.   On: 05/27/2017 15:53   Dg Chest Port 1 View  Result Date: 05/27/2017 CLINICAL DATA:  Hypotension with dizziness EXAM: PORTABLE CHEST  1 VIEW COMPARISON:  CT chest March 31, 2013 FINDINGS: There is a calcified granuloma in the right lower lobe. There is no edema or consolidation. Heart size and pulmonary vascularity are within normal limits. No adenopathy. There is aortic atherosclerosis. There is advanced arthropathy in both shoulders. IMPRESSION: No edema or consolidation.  Calcified granuloma right lower lobe. Heart size within normal limits.  There is aortic atherosclerosis. Advanced arthropathy in both shoulders. Aortic Atherosclerosis (ICD10-I70.0). Electronically Signed   By: Lowella Grip III M.D.   On: 05/27/2017 13:02    EKG: Independently reviewed. Sinus with QTc 515  Assessment/Plan Active Problems:   HX: breast cancer   Essential hypertension, benign   Diabetes mellitus type 2, controlled (HCC)   Hypocalcemia   Prolonged QT interval   Colitis   1. Hypocalcemia 1. Uncertain etiology. 2. Will check PTH, TSH,  phosphorus, Mg, 25(OH) vit D levels 3. Patient given 1gm IV calcium in ED 4. Repeat lytes in AM 2. Circumferential mucosal thickening of gastric body with probably sigmoid diverticulitis 1. Patient with one month hx of abd pain, cramping diarrhea with above findings noted 2. Pt with leukocytosis of 14.8k. Afebrile 3. Will start empiric rocephin and flagyl. 4. Discussed case with GI who will see patient in consultation 3. DM2 1. Continue SSI coverage 2. Pt on metformin prior to admit. Will hold metformin given presenting GI symptoms 4. HTN 1. BP stable 2. Continue home regimen as tolerated 5. Prolonged QTc 1. Suspect related to presenting hypocalcemia, hypokalemia 2. Will check Mg and correct as needed 3. Will repeat EKG in AM 6. Hx breast cancer 1. Appears stable at present 7. Hx DVT 1. Pt is continued on Xarelto for prior PE 2. No signs of acute blood loss 8. Pulm HTN 1. Recently diagnosed and continued on Cialis 2. Continue to monitor for now 3. Pt is hypoxic. May need home O2 when discharged 9. Malnutrition 1. Patient reports markedly poor PO intake over the past month 2. Serum albumin 3.8 3. Urine ketones are elevated at >80 4. Will consult Dietitian for assistance 10. R adrenal nodule 1. Incidental 1.9cm adrenal nodule noted on CT abd/pelvis 2. Would obtain formal MRI when pt is more stable to do so  DVT prophylaxis: Xarelto  Code Status: Full Family Communication: Pt in room  Disposition Plan: Uncertain at this time  Consults called: GI (Dr. Carlean Purl) Admission status: Inpatient, as would likely require greater than 2 midnight stay to work up hypocalcemia and suspected colitis   Amanda Short, Orpah Melter MD [facility] Pager [phone]  If 7PM-7AM, please contact night-coverage [email or web address] Password [facility]  05/27/2017, 4:56 PM

## 2017-05-27 NOTE — Progress Notes (Signed)
CRITICAL VALUE ALERT  Critical Value:  Magnesium 0.2  Date & Time Notied:  05/27/17 2050  Provider Notified: Hal Hope  Orders Received/Actions taken: 2 gm Mag with lab repeat @ 0200

## 2017-05-27 NOTE — ED Notes (Signed)
ED Provider at bedside. 

## 2017-05-27 NOTE — ED Provider Notes (Signed)
[facility] EMERGENCY DEPARTMENT Provider Note   CSN: [id number] Arrival date & time: 05/27/17  1206     History   Chief Complaint Chief Complaint  Patient presents with  . Emesis    HPI Amanda Short is a 76 y.o. female.  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  She reports that after she was diagnosed with the pulmonary hypertension she was started on Cialis which she has taken for the last month however throughout the month she has had persistent nausea abdominal upset and diarrhea.  She stopped taking this medication 4 days ago but her symptoms have continued.  She reports frequent loose to watery stools, abdominal discomfort and nausea, she has not had anything to eat or drink in 4 days and has had a progressive dizziness which she describes as a vertigo.  She tried to sit down in a chair today, slipped and fell to the ground, the paramedics were called and found the patient to be hypotensive with a blood pressure of less than 80 palp, she received 500 cc of normal saline and improved clinically as well as by her vital signs.  She denies fevers chills chest pain coughing or worsening shortness of breath.  She has not had any significant swelling in the legs, no headache, no blurred vision.  She is still nauseated.  She has not had any urinary symptoms.  The pt has had her w/u in [place] at [facility] / Cardiologist.  She is here visiting family.  Past Medical History:  Diagnosis Date  . Arthritis    "back, hands" (07/22/2015)  . Cancer of left breast ([place]) 2006   S/P lumpectomy  . Complication of anesthesia    "brief breathing problem at surgery center in ~ 2005 when I had gallbladder OR"  . Depression   . DVT (deep venous  thrombosis) ([place]) 03/2013   LLE  . GERD (gastroesophageal reflux disease)   . Hyperlipidemia   . Hypertension   . Hypothyroidism   . OSA treated with BiPAP   . Pulmonary embolism ([place]) 03/2013  . Thyroid disease    Hypothyroid  . Type II diabetes mellitus [facility])     Patient Active Problem List   Diagnosis Date Noted  . Diabetic foot infection ([place]) 07/21/2015  . Diabetes mellitus type 2, controlled ([place]) 07/21/2015  . Cellulitis of foot, right 07/21/2015  . Cellulitis of right foot 07/21/2015  . Acute pulmonary embolism ([place]) 04/01/2013  . DVT (deep venous thrombosis) ([place]) 04/01/2013  . HX: breast cancer 04/01/2013  . Concussion 04/01/2013  . Diabetes ([place]) 04/01/2013  . Essential hypertension, benign 04/01/2013  . Unspecified hypothyroidism 04/01/2013  . OSA on CPAP 04/01/2013    Past Surgical History:  Procedure Laterality Date  . ABDOMINAL HYSTERECTOMY  1970s  . BREAST BIOPSY Left 2006  . BREAST LUMPECTOMY Left 2006  . BUNIONECTOMY WITH HAMMERTOE RECONSTRUCTION Left   . JOINT REPLACEMENT    . LAPAROSCOPIC CHOLECYSTECTOMY  ~ 2005  . TOTAL HIP ARTHROPLASTY Left 2010  . TOTAL KNEE ARTHROPLASTY Bilateral 2005-2006    OB History    No data available       Home Medications    Prior to Admission medications   Medication Sig Start Date End Date Taking? Authorizing Provider  atorvastatin (LIPITOR)  20 MG tablet Take 1 tablet (20 mg total) by mouth daily at 6 PM. 04/04/13   Regalado, Belkys A, MD  cephALEXin (KEFLEX) 500 MG capsule Take 1 capsule (500 mg total) by mouth 4 (four) times daily. 07/23/15   Kelvin Cellar, MD  clonazePAM (KLONOPIN) 0.5 MG tablet Take 0.5 mg by mouth at bedtime.     [provider]  DULoxetine (CYMBALTA) 60 MG capsule Take 60 mg by mouth daily.    [provider]  furosemide (LASIX) 40 MG tablet Take 40 mg by mouth daily.    [provider]  glyBURIDE (DIABETA) 2.5 MG tablet Take 2.5 mg by mouth daily with breakfast.     [provider]  levothyroxine (SYNTHROID, LEVOTHROID) 100 MCG tablet Take 100 mcg by mouth daily before breakfast.    [provider]  metFORMIN (GLUCOPHAGE) 500 MG tablet Take 500-1,000 mg by mouth 2 (two) times daily with a meal. Take 1000 mg in the morning and 500 mg in the evening    [provider]  metoprolol (TOPROL-XL) 200 MG 24 hr tablet Take 100 mg by mouth at bedtime.     [provider]  omeprazole (PRILOSEC) 40 MG capsule Take 40 mg by mouth 2 (two) times daily.    [provider]  Rivaroxaban (XARELTO PO) Take 15 mg by mouth at bedtime.     [provider]  verapamil (VERELAN PM) 180 MG 24 hr capsule Take 180 mg by mouth 2 (two) times daily.    [provider]    Family History Family History  Problem Relation Age of Onset  . Diabetes Unknown   . Diabetes Mellitus II Sister     Social History Social History   Tobacco Use  . Smoking status: Former Smoker    Packs/day: 1.00    Years: 30.00    Pack years: 30.00    Types: Cigarettes  . Smokeless tobacco: Never Used  . Tobacco comment: "quit smoking cigarettes in the 1990s"  Substance Use Topics  . Alcohol use: No  . Drug use: No     Allergies   Allopurinol   Review of Systems Review of Systems  All other systems reviewed and are negative.    Physical Exam Updated Vital Signs BP 140/75 (BP Location: Right Arm)   Pulse 63   Temp 97.9 F (36.6 C) (Oral)   Resp 18   Wt 80.3 kg (177 lb)   SpO2 96%   BMI 33.44 kg/m   Physical Exam  Constitutional: She appears well-developed and well-nourished. No distress.  HENT:  Head: Normocephalic and atraumatic.  Mouth/Throat: Oropharynx is clear and moist. No oropharyngeal exudate.  OP clear = mildly dehydrated  Eyes: Conjunctivae and EOM are normal. Pupils are equal, round, and reactive to light. Right eye exhibits no discharge. Left eye exhibits no discharge. No scleral icterus.  Neck: Normal  range of motion. Neck supple. No JVD present. No thyromegaly present.  Cardiovascular: Normal rate, regular rhythm, normal heart sounds and intact distal pulses. Exam reveals no gallop and no friction rub.  No murmur heard. Pulmonary/Chest: Effort normal and breath sounds normal. No respiratory distress. She has no wheezes. She has no rales.  Abdominal: Soft. Bowel sounds are normal. She exhibits no distension and no mass. There is no tenderness ( minimal mid abd ttp, no guarding).  Musculoskeletal: Normal range of motion. She exhibits no edema or tenderness.  Lymphadenopathy:    She has no cervical adenopathy.  Neurological: She  is alert. Coordination normal.  Skin: Skin is warm and dry. No rash noted. No erythema.  Psychiatric: She has a normal mood and affect. Her behavior is normal.  Nursing note and vitals reviewed.    ED Treatments / Results  Labs (all labs ordered are listed, but only abnormal results are displayed) Labs Reviewed  CBC WITH DIFFERENTIAL/PLATELET - Abnormal; Notable for the following components:      Result Value   WBC 14.8 (*)    Neutro Abs 12.7 (*)    All other components within normal limits  COMPREHENSIVE METABOLIC PANEL - Abnormal; Notable for the following components:   Sodium 133 (*)    Potassium 3.0 (*)    Chloride 86 (*)    Glucose, Bld 199 (*)    Calcium 5.9 (*)    Total Protein 6.4 (*)    ALT 13 (*)    All other components within normal limits  CBG MONITORING, ED - Abnormal; Notable for the following components:   Glucose-Capillary 186 (*)    All other components within normal limits  URINALYSIS, ROUTINE W REFLEX MICROSCOPIC    EKG  EKG Interpretation  Date/Time:  Sunday May 27 2017 12:34:51 EST Ventricular Rate:  64 PR Interval:    QRS Duration: 112 QT Interval:  499 QTC Calculation: 515 R Axis:   38 Text Interpretation:  Sinus rhythm Incomplete right bundle branch block Prolonged QT interval Confirmed by Noemi Chapel [phone]) on  05/27/2017 12:48:05 PM       Radiology Dg Chest Port 1 View  Result Date: 05/27/2017 CLINICAL DATA:  Hypotension with dizziness EXAM: PORTABLE CHEST 1 VIEW COMPARISON:  CT chest March 31, 2013 FINDINGS: There is a calcified granuloma in the right lower lobe. There is no edema or consolidation. Heart size and pulmonary vascularity are within normal limits. No adenopathy. There is aortic atherosclerosis. There is advanced arthropathy in both shoulders. IMPRESSION: No edema or consolidation.  Calcified granuloma right lower lobe. Heart size within normal limits.  There is aortic atherosclerosis. Advanced arthropathy in both shoulders. Aortic Atherosclerosis (ICD10-I70.0). Electronically Signed   By: Lowella Grip III M.D.   On: 05/27/2017 13:02    Procedures Procedures (including critical care time)  Medications Ordered in ED Medications  0.9 %  sodium chloride infusion (1,000 mLs Intravenous New Bag/Given 05/27/17 1326)  calcium gluconate 1 g in sodium chloride 0.9 % 100 mL IVPB (1 g Intravenous New Bag/Given 05/27/17 1440)  ondansetron (ZOFRAN-ODT) disintegrating tablet 4 mg (4 mg Oral Given 05/27/17 1320)  metoCLOPramide (REGLAN) injection 10 mg (10 mg Intravenous Given 05/27/17 1440)     Initial Impression / Assessment and Plan / ED Course  I have reviewed the triage vital signs and the nursing notes.  Pertinent labs & imaging results that were available during my care of the patient were reviewed by me and considered in my medical decision making (see chart for details).     The patient's blood pressure is up in the normal range, she is not tachycardic, she has no other acute findings other than appearing dehydrated.  Due to her persistent dehydration and nausea she will need some IV fluids and antiemetics.  She is a diabetic on metformin thus I am concerned somewhat about her kidneys, will check labs, fluids, reevaluate.  The patient has both prolonged QTC as well as  hypokalemia and hypocalcemia both of which will cause prolonged QT.  She has a leukocytosis of 14,800, with her abdominal cramping and diarrhea she will need  a CT scan to further evaluate the cause including potential C. difficile or infectious colitis.  She has had some temporary improvement with Zofran, she will not have any more Zofran due to her QT interval.  We will give Reglan.  I discussed her care with Dr. Levora Angel of the hospitalist service who was kind enough to accept the patient in transfer to a med telemetry bed  Final Clinical Impressions(s) / ED Diagnoses   Final diagnoses:  Hypocalcemia  Nausea  Prolonged QT interval      Noemi Chapel, MD 05/27/17 1454

## 2017-05-28 ENCOUNTER — Other Ambulatory Visit: Payer: Self-pay

## 2017-05-28 DIAGNOSIS — E860 Dehydration: Secondary | ICD-10-CM

## 2017-05-28 DIAGNOSIS — G43A1 Cyclical vomiting, intractable: Secondary | ICD-10-CM

## 2017-05-28 DIAGNOSIS — K529 Noninfective gastroenteritis and colitis, unspecified: Secondary | ICD-10-CM

## 2017-05-28 DIAGNOSIS — R935 Abnormal findings on diagnostic imaging of other abdominal regions, including retroperitoneum: Secondary | ICD-10-CM

## 2017-05-28 LAB — MAGNESIUM
MAGNESIUM: 0.9 mg/dL — AB (ref 1.7–2.4)
MAGNESIUM: 3 mg/dL — AB (ref 1.7–2.4)

## 2017-05-28 LAB — COMPREHENSIVE METABOLIC PANEL
ALBUMIN: 3.4 g/dL — AB (ref 3.5–5.0)
ALK PHOS: 70 U/L (ref 38–126)
ALT: 16 U/L (ref 14–54)
ALT: 17 U/L (ref 14–54)
ANION GAP: 13 (ref 5–15)
ANION GAP: 10 (ref 5–15)
AST: 55 U/L — AB (ref 15–41)
AST: 56 U/L — ABNORMAL HIGH (ref 15–41)
Albumin: 3.2 g/dL — ABNORMAL LOW (ref 3.5–5.0)
Alkaline Phosphatase: 74 U/L (ref 38–126)
BILIRUBIN TOTAL: 1.1 mg/dL (ref 0.3–1.2)
BILIRUBIN TOTAL: 1 mg/dL (ref 0.3–1.2)
BUN: 11 mg/dL (ref 6–20)
BUN: 11 mg/dL (ref 6–20)
CALCIUM: 6.2 mg/dL — AB (ref 8.9–10.3)
CO2: 32 mmol/L (ref 22–32)
CO2: 32 mmol/L (ref 22–32)
Calcium: 6.4 mg/dL — CL (ref 8.9–10.3)
Chloride: 93 mmol/L — ABNORMAL LOW (ref 101–111)
Chloride: 94 mmol/L — ABNORMAL LOW (ref 101–111)
Creatinine, Ser: 0.85 mg/dL (ref 0.44–1.00)
Creatinine, Ser: 0.84 mg/dL (ref 0.44–1.00)
GFR calc Af Amer: >60 mL/min (ref >60)
GFR calc Af Amer: >60 mL/min (ref >60)
GFR calc non Af Amer: >60 mL/min (ref >60)
GLUCOSE: 177 mg/dL — AB (ref 65–99)
GLUCOSE: 179 mg/dL — AB (ref 65–99)
POTASSIUM: 3.2 mmol/L — AB (ref 3.5–5.1)
Potassium: 2.7 mmol/L — CL (ref 3.5–5.1)
SODIUM: 136 mmol/L (ref 135–145)
Sodium: 138 mmol/L (ref 135–145)
TOTAL PROTEIN: 5.9 g/dL — AB (ref 6.5–8.1)
TOTAL PROTEIN: 5.8 g/dL — AB (ref 6.5–8.1)

## 2017-05-28 LAB — CBC
HCT: 37.7 % (ref 36.0–46.0)
HEMOGLOBIN: 12.3 g/dL (ref 12.0–15.0)
MCH: 31.2 pg (ref 26.0–34.0)
MCHC: 32.6 g/dL (ref 30.0–36.0)
MCV: 95.7 fL (ref 78.0–100.0)
Platelets: 307 10*3/uL (ref 150–400)
RBC: 3.94 MIL/uL (ref 3.87–5.11)
RDW: 14 % (ref 11.5–15.5)
WBC: 19.5 10*3/uL — AB (ref 4.0–10.5)

## 2017-05-28 LAB — GLUCOSE, CAPILLARY
GLUCOSE-CAPILLARY: 87 mg/dL (ref 65–99)
Glucose-Capillary: 144 mg/dL — ABNORMAL HIGH (ref 65–99)
Glucose-Capillary: 176 mg/dL — ABNORMAL HIGH (ref 65–99)
Glucose-Capillary: 240 mg/dL — ABNORMAL HIGH (ref 65–99)

## 2017-05-28 LAB — POTASSIUM: Potassium: 4.1 mmol/L (ref 3.5–5.1)

## 2017-05-28 MED ORDER — POTASSIUM CHLORIDE CRYS ER 20 MEQ PO TBCR
80.0000 meq | EXTENDED_RELEASE_TABLET | Freq: Once | ORAL | Status: AC
  Administered 2017-05-28: 80 meq via ORAL

## 2017-05-28 MED ORDER — MAGNESIUM SULFATE 4 GM/100ML IV SOLN
4.0000 g | Freq: Once | INTRAVENOUS | Status: AC
  Administered 2017-05-28: 4 g via INTRAVENOUS
  Filled 2017-05-28: qty 100

## 2017-05-28 MED ORDER — PREMIER PROTEIN SHAKE
2.0000 [oz_av] | Freq: Two times a day (BID) | ORAL | Status: DC
  Administered 2017-05-28 – 2017-05-30 (×3): 2 [oz_av] via ORAL
  Filled 2017-05-28 (×5): qty 325.31

## 2017-05-28 MED ORDER — MAGNESIUM SULFATE 2 GM/50ML IV SOLN
2.0000 g | Freq: Once | INTRAVENOUS | Status: AC
  Administered 2017-05-28: 2 g via INTRAVENOUS
  Filled 2017-05-28: qty 50

## 2017-05-28 MED ORDER — POTASSIUM CHLORIDE CRYS ER 20 MEQ PO TBCR
40.0000 meq | EXTENDED_RELEASE_TABLET | Freq: Once | ORAL | Status: AC
  Administered 2017-05-28: 40 meq via ORAL
  Filled 2017-05-28: qty 2

## 2017-05-28 MED ORDER — SODIUM CHLORIDE 0.9 % IV SOLN
1.0000 g | Freq: Once | INTRAVENOUS | Status: AC
  Administered 2017-05-28: 1 g via INTRAVENOUS
  Filled 2017-05-28: qty 10

## 2017-05-28 MED ORDER — MAGNESIUM SULFATE 2 GM/50ML IV SOLN
2.0000 g | Freq: Once | INTRAVENOUS | Status: DC
  Filled 2017-05-28: qty 50

## 2017-05-28 NOTE — H&P (View-Only) (Signed)
Referring Provider: [facility]  Primary Care Physician:  System, [place] Not In In [place], [name] Primary Gastroenterologist: unassigned  Reason for Consultation:  Abnormal stomach on CT scan. Nausea / vomiting and diarrhea.   ASSESSMENT AND PLAN:    1. 76 yo female admitted with nausea / vomiting and diffuse mid upper abdominal discomfort over the last month. She correlates onset of symptoms with initiation of Cialis for pulmonary HTN though CT scan suggesting circumferential thickening of distal gastric body. She is on chronic BID PPI at home -For further evaluation patient will likely need EGD with possible biopsies. Her last dose of Xarelto was yesterday.    2. Diverticulosis, CT scan suggests "probable" sigmoid diverticulitis. She isn't really tender on exam. No significant lower abdominal pain.  Last colonoscopy in [place] ~ 6 years ago. Per patient, exam was normal. Told to have repeat in 10 years.  -on Rocephin and flagyl.   3. Limited diarrhea. Only two episodes and none in last several days. Says she is having normal formed stools over last few days. No recent antibiotics.  -given absence of diarrhea I d/c ed C-diff. Patient knows to let nurse know if she does have any loose BMs and then we can send for c-diff.   4. Leukocytosis. WBC 14.8 up to ~ 19 today. U/A unremarkable. CXR unremarkable. Workup in progress. Being treated empirically for diverticulitis.   5. Hypocalcemia, repleted. Workup in process  6. Hx of PE, home Xarelto on hold.  7. GERD, asymptomatic on BID PPI   HPI: Amanda Short is a 76 y.o. female with a hx of OSA on CPAP, DM2, breast cancer and PE, on chronic Xarelto. She was brought to ED yesterday by EMS for nausea / vomiting / dizziness  Per EMS she was hypotensive, resuscitated with IVF in route to ED. EKG >> sinus rhythm with incomplete RBBB, prolonged QTC in setting of hypokalemia and hypocalcemia.She started Cialis a month ago for pulmonary HTN.  Approx 5 days after starting Cialis  She began having nausea and vomiting almost on a daily basis. She also admits to non-radiating mid upper abdominal "ache" over the last month. The discomfort is not related to eating or anything else. She last lost ~ 15 pounds over the last year but some of that was intentional. Over the last month she had two episodes of diarrhea but stools have o/w been normal. She hasn't had any loose stool in days. No fevers. the last month she has had N/V/D. WBC 14. 8. CT scan reveals circumferential thickening of distal gastric body, extensive diverticulosis and probable sigmoid diverticulitis. Also an indeterminate 1.9 cm right adrenal nodule. She doesn't take NSAIDs. Sounds like she had an upper GI bleed years ago after taking Naproxen. She has chronic GERD, asymptomatic on BID PPI   Past Medical History:  Diagnosis Date  . Arthritis    "back, hands" (07/22/2015)  . Cancer of left breast ([place]) 2006   S/P lumpectomy  . Complication of anesthesia    "brief breathing problem at surgery center in ~ 2005 when I had gallbladder OR"  . Depression   . DVT (deep venous thrombosis) ([place]) 03/2013   LLE  . GERD (gastroesophageal reflux disease)   . Hyperlipidemia   . Hypertension   . Hypothyroidism   . OSA treated with BiPAP   . Pulmonary embolism ([place]) 03/2013  . Thyroid disease    Hypothyroid  . Type II diabetes mellitus ([place])     Past Surgical History:  Procedure Laterality Date  . ABDOMINAL HYSTERECTOMY  1970s  . BREAST BIOPSY Left 2006  . BREAST LUMPECTOMY Left 2006  . BUNIONECTOMY WITH HAMMERTOE RECONSTRUCTION Left   . JOINT REPLACEMENT    . LAPAROSCOPIC CHOLECYSTECTOMY  ~ 2005  . TOTAL HIP ARTHROPLASTY Left 2010  . TOTAL KNEE ARTHROPLASTY Bilateral 2005-2006    Prior to Admission medications   Medication Sig Start Date End Date Taking? Authorizing Provider  DULoxetine (CYMBALTA) 60 MG capsule Take 60 mg by mouth daily.   Yes [provider]    febuxostat (ULORIC) 40 MG tablet Take 40 mg daily by mouth.   Yes [provider]  furosemide (LASIX) 40 MG tablet Take 40 mg by mouth daily.   Yes [provider]  levothyroxine (SYNTHROID, LEVOTHROID) 100 MCG tablet Take 100 mcg by mouth daily before breakfast.   Yes [provider]  metFORMIN (GLUCOPHAGE) 500 MG tablet Take 1,000 mg daily by mouth. Take 1000 mg in the morning and 500 mg in the evening    Yes [provider]  metoprolol (TOPROL-XL) 200 MG 24 hr tablet Take 100 mg by mouth at bedtime.    Yes [provider]  montelukast (SINGULAIR) 10 MG tablet Take 10 mg at bedtime by mouth.   Yes [provider]  Multiple Vitamin (MULTIVITAMIN WITH MINERALS) TABS tablet Take 1 tablet daily by mouth.   Yes [provider]  omeprazole (PRILOSEC) 40 MG capsule Take 40 mg by mouth 2 (two) times daily.   Yes [provider]  Rivaroxaban (XARELTO PO) Take 15 mg by mouth at bedtime.    Yes [provider]  spironolactone (ALDACTONE) 25 MG tablet Take 50 mg daily by mouth.   Yes [provider]  traMADol (ULTRAM) 50 MG tablet Take 50 mg every 4 (four) hours as needed by mouth for pain.   Yes [provider]  atorvastatin (LIPITOR) 20 MG tablet Take 1 tablet (20 mg total) by mouth daily at 6 PM. Patient not taking: Reported on 05/27/2017 04/04/13   Regalado, Jerald Kief A, MD  cephALEXin (KEFLEX) 500 MG capsule Take 1 capsule (500 mg total) by mouth 4 (four) times daily. Patient not taking: Reported on 05/27/2017 07/23/15   Kelvin Cellar, MD    Current Facility-Administered Medications  Medication Dose Route Frequency Provider Last Rate Last Dose  . 0.9 %  sodium chloride infusion   Intravenous Continuous Donne Hazel, MD 75 mL/hr at 05/27/17 1929    . acetaminophen (TYLENOL) tablet 650 mg  650 mg Oral Q6H PRN Donne Hazel, MD       Or  . acetaminophen (TYLENOL) suppository 650 mg  650 mg Rectal Q6H  PRN Donne Hazel, MD      . calcium gluconate 1 g in sodium chloride 0.9 % 100 mL IVPB  1 g Intravenous Once Theodis Blaze, MD      . cefTRIAXone (ROCEPHIN) 2 g in dextrose 5 % 50 mL IVPB  2 g Intravenous Q24H Donne Hazel, MD      . DULoxetine (CYMBALTA) DR capsule 60 mg  60 mg Oral Daily Donne Hazel, MD   60 mg at 05/28/17 0945  . febuxostat (ULORIC) tablet 40 mg  40 mg Oral Daily Donne Hazel, MD   40 mg at 05/28/17 0945  . feeding supplement (BOOST / RESOURCE BREEZE) liquid 1 Container  1 Container Oral TID BM Donne Hazel, MD   1 Container at 05/28/17 [phone]  .  insulin aspart (novoLOG) injection 0-15 Units  0-15 Units Subcutaneous TID WC Donne Hazel, MD   2 Units at 05/28/17 0900  . insulin aspart (novoLOG) injection 0-5 Units  0-5 Units Subcutaneous QHS Donne Hazel, MD      . ketorolac (TORADOL) 15 MG/ML injection 15 mg  15 mg Intravenous Q6H PRN Donne Hazel, MD      . levothyroxine (SYNTHROID, LEVOTHROID) tablet 100 mcg  100 mcg Oral QAC breakfast Donne Hazel, MD   100 mcg at 05/27/17 2055  . metoprolol succinate (TOPROL-XL) 24 hr tablet 100 mg  100 mg Oral QHS Donne Hazel, MD   100 mg at 05/28/17 0109  . metroNIDAZOLE (FLAGYL) IVPB 500 mg  500 mg Intravenous Q8H Donne Hazel, MD 100 mL/hr at 05/28/17 0936 500 mg at 05/28/17 0936  . montelukast (SINGULAIR) tablet 10 mg  10 mg Oral QHS Donne Hazel, MD   10 mg at 05/28/17 0110  . pantoprazole (PROTONIX) EC tablet 40 mg  40 mg Oral Daily Donne Hazel, MD   40 mg at 05/28/17 0944  . Rivaroxaban (XARELTO) tablet 15 mg  15 mg Oral QHS Donne Hazel, MD   15 mg at 05/28/17 0110  . sodium chloride 0.9 % bolus 250 mL  250 mL Intravenous Once Donne Hazel, MD      . spironolactone (ALDACTONE) tablet 50 mg  50 mg Oral Daily Donne Hazel, MD   50 mg at 05/27/17 2055    Allergies as of 05/27/2017 - Review Complete 05/27/2017  Allergen Reaction Noted  . Allopurinol Nausea And Vomiting 05/27/2017     Family History  Problem Relation Age of Onset  . Diabetes Unknown   . Diabetes Mellitus II Sister     Social History   Socioeconomic History  . Marital status: Married    Spouse name: Not on file  . Number of children: Not on file  . Years of education: Not on file  . Highest education level: Not on file  Social Needs  . Financial resource strain: Not on file  . Food insecurity - worry: Not on file  . Food insecurity - inability: Not on file  . Transportation needs - medical: Not on file  . Transportation needs - non-medical: Not on file  Occupational History  . Not on file  Tobacco Use  . Smoking status: Former Smoker    Packs/day: 1.00    Years: 30.00    Pack years: 30.00    Types: Cigarettes  . Smokeless tobacco: Never Used  . Tobacco comment: "quit smoking cigarettes in the 1990s"  Substance and Sexual Activity  . Alcohol use: No  . Drug use: No  . Sexual activity: Not Currently  Other Topics Concern  . Not on file  Social History Narrative  . Not on file    Review of Systems: All systems reviewed and negative except where noted in HPI.  Physical Exam: Vital signs in last 24 hours: Temp:  [97.9 F (36.6 C)-98.9 F (37.2 C)] 98.7 F (37.1 C) (11/12 0518) Pulse Rate:  [60-68] 65 (11/12 0518) Resp:  [18-21] 20 (11/12 0518) BP: (118-140)/(50-75) 118/50 (11/12 0518) SpO2:  [86 %-98 %] 93 % (11/12 0518) Weight:  [158 lb 15.2 oz (72.1 kg)-177 lb (80.3 kg)] 158 lb 15.2 oz (72.1 kg) (11/12 0518) Last BM Date: 05/26/17 General:   Alert, well-developed,  White female in NAD Psych:  Pleasant, cooperative. Normal mood and affect. Eyes:  Pupils equal, sclera clear, no icterus.   Conjunctiva pink. Ears:  Normal auditory acuity. Nose:  No deformity, discharge,  or lesions. Neck:  Supple; no masses Lungs:  Clear throughout to auscultation.   No wheezes, crackles, or rhonchi.  Heart:  Regular rate and rhythm; no murmurs, no edema Abdomen:  Soft, non-distended,  MILD mid upper abdominal tenderness.  BS active, no palp mass    Rectal:  Deferred  Msk:  Symmetrical without gross deformities. . Pulses:  Normal pulses noted. Neurologic:  Alert and  oriented x4;  grossly normal neurologically. Skin:  Intact without significant lesions or rashes..   Intake/Output from previous day: 11/11 0701 - 11/12 0700 In: 952.5 [I.V.:852.5; IV Piggyback:100] Out: 1050 [Urine:1050] Intake/Output this shift: No intake/output data recorded.  Lab Results: Recent Labs    05/27/17 1316 05/28/17 0229  WBC 14.8* 19.5*  HGB 12.4 12.3  HCT 37.6 37.7  PLT 282 307   BMET Recent Labs    05/27/17 1316 05/28/17 0229 05/28/17 0811  NA 133* 138 136  K 3.0* 2.7* 3.2*  CL 86* 93* 94*  CO2 32 32 32  GLUCOSE 199* 177* 179*  BUN _0 CREATININE 0.67 0.85 0.84  CALCIUM 5.9* 6.2* 6.4*   LFT Recent Labs    05/28/17 0811  PROT 5.8*  ALBUMIN 3.2*  AST 56*  ALT 17  ALKPHOS 74  BILITOT 1.0    Studies/Results: Ct Abdomen Pelvis W Contrast  Result Date: 05/27/2017 CLINICAL DATA:  Nausea vomiting for 4 days.  Mid abdominal pain. EXAM: CT ABDOMEN AND PELVIS WITH CONTRAST TECHNIQUE: Multidetector CT imaging of the abdomen and pelvis was performed using the standard protocol following bolus administration of intravenous contrast. CONTRAST:  162m ISOVUE-300 IOPAMIDOL (ISOVUE-300) INJECTION 61% COMPARISON:  None. FINDINGS: Lower chest: No acute abnormality. Rim calcified mass within the left breast, query postsurgical. Hepatobiliary: No focal liver abnormality is seen. Status post cholecystectomy. No biliary dilatation. Pancreas: Unremarkable. No pancreatic ductal dilatation or surrounding inflammatory changes. Spleen: Normal in size without focal abnormality. Adrenals/Urinary Tract: Intermediate density 1.9 cm right adrenal nodule. Kidneys are normal, without renal calculi, focal lesion, or hydronephrosis. Bladder is unremarkable. Stomach/Bowel: Mild mucosal  thickening of the distal gastric body, image 41/92, sequence 2. No evidence of small-bowel obstruction. Normal appendix. Extensive colonic diverticulosis, particularly of the sigmoid colon. Mucosal thickening of the sigmoid colon may represent an element of diverticulitis. Vascular/Lymphatic: Aortic atherosclerosis. No enlarged abdominal or pelvic lymph nodes. Reproductive: Status post hysterectomy. No adnexal masses. Other: No abdominal wall hernia or abnormality. No abdominopelvic ascites. Musculoskeletal: Multilevel osteoarthritic changes of the spine. Left total hip arthroplasty. IMPRESSION: Indeterminate 1.9 cm right adrenal nodule. If further evaluation is clinically desired, abdominal CT or MRI, adrenal protocol should be followed. Circumferential mucosal thickening of the distal gastric body. This may represent inflammatory changes/ hypertrophy of the gastric mucosa. Infiltrating mass however is on the differential diagnosis. Extensive colonic diverticulosis with probable sigmoid diverticulitis. Please correlate to results of colonoscopy to exclude underlying mass. Electronically Signed   By: DFidela SalisburyM.D.   On: 05/27/2017 15:53   Dg Chest Port 1 View  Result Date: 05/27/2017 CLINICAL DATA:  Hypotension with dizziness EXAM: PORTABLE CHEST 1 VIEW COMPARISON:  CT chest March 31, 2013 FINDINGS: There is a calcified granuloma in the right lower lobe. There is no edema or consolidation. Heart size and pulmonary vascularity are within normal limits. No adenopathy. There is aortic atherosclerosis. There is advanced arthropathy in both shoulders. IMPRESSION:  No edema or consolidation.  Calcified granuloma right lower lobe. Heart size within normal limits.  There is aortic atherosclerosis. Advanced arthropathy in both shoulders. Aortic Atherosclerosis (ICD10-I70.0). Electronically Signed   By: Lowella Grip III M.D.   On: 05/27/2017 13:02    Tye Savoy, NP-C @  05/28/2017, 10:03  AM  Pager number [phone]  GI ATTENDING  History, laboratories, x-rays reviewed. Patient personally seen and examined. Agree with comprehensive consultation note as outlined above. Patient presents with intractable nausea and vomiting as well as abdominal discomfort. Found to have multiple electrolyte abnormalities. These have been corrected along with hydration. Imaging suggests abnormality of the antrum. Inflammatory or infiltrative process should be excluded. Plans for upper endoscopy tomorrow.The nature of the procedure, as well as the risks, benefits, and alternatives were carefully and thoroughly reviewed with the patient. Ample time for discussion and questions allowed. The patient understood, was satisfied, and agreed to proceed.. Also, question diverticulitis. Agree with antibiotic therapy given leukocytosis.  Docia Chuck. Geri Seminole., M.D. [facility] Division of Gastroenterology

## 2017-05-28 NOTE — Progress Notes (Signed)
Patient ID: Amanda Short, female   DOB: [birth year], 76 y.o.   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 Blood work was also notable for severe electrolyte disturbance including calcium of 5.9, K 3, magnesium <1, QT prolongation noted on EKG.  On further questioning, patient reported nausea and poor oral intake, nonbloody diarrhea and abdominal cramping shortly after she was started on Cialis for pulmonary hypertension about a month ago.  Assessment & Plan:   Active Problems:   Sigmoid diverticulitis - cont Cipro and Flagyl, follow up on GI team recommendations    Hypocalcemia  - Unclear etiology, ? From diarrhea in the setting of diverticulitis  - PTH, TSH, vitamin D level pending - Continue to supplement and repeat in the morning    Prolongation of QTc - in the setting of electrolyte depletion including calcium, potassium, magnesium - Continue to supplement electrolytes, EKG to be repeated     Hypomagnesemia, hypokalemia - Possibly in the setting of ongoing diarrhea, diverticulitis - Supplemented as noted above     Diabetes type 2 - Patient on metformin at home, we are holding for now - Continue sliding scale insulin    Pulmonary HTN - on Cialis, cont to monitor - may need oxygen on discharge     HX: breast cancer - Stable at present     Essential hypertension, benign -  Reasonable inpatient control     DVT prophylaxis:  Xarelto  Code Status:  full code  Family Communication: Patient at bedside  Disposition Plan:  to be determined   Consultants:   Gastroenterologist   Procedures:   None    Antimicrobials:   Rocephin 05/28/2011 -->  Flagyl 05/28/2011 -->  Subjective: Patient reports feeling better.  Objective: Vitals:   05/27/17 1548 05/27/17 1646 05/27/17 2205 05/28/17 0518  BP: 133/67 133/74 122/60 (!) 118/50  Pulse: 66 63 67 65  Resp: _0 Temp:  98.8 F (37.1 C) 98.9 F (37.2 C) 98.7 F (37.1 C)  TempSrc:  Oral Oral Oral  SpO2: 96% 96% 92% 93%  Weight:  73.1 kg (161 lb 2.5 oz)  72.1 kg (158 lb 15.2 oz)  Height:  5' 1" (1.549 m)      Intake/Output Summary (Last 24 hours) at 05/28/2017 0904 Last data filed at 05/28/2017 0600 Gross per 24 hour  Intake 952.5 ml  Output 1050 ml  Net -97.5 ml   Filed Weights   05/27/17 1214 05/27/17 1646 05/28/17 0518  Weight: 80.3 kg (177 lb) 73.1 kg (161 lb 2.5 oz) 72.1 kg (158 lb 15.2 oz)    Examination:  General exam: Appears calm and comfortable  Respiratory system: Clear to auscultation. Respiratory effort normal. Cardiovascular system: S1 & S2 heard, RRR. No JVD, murmurs, rubs, gallops or clicks. No pedal edema. Gastrointestinal system: Abdomen is nondistended, soft and nontender. No organomegaly or masses felt. Normal bowel sounds heard. Central nervous system: Alert and oriented. No focal  neurological deficits.  Data Reviewed: I have personally reviewed following labs and imaging studies  CBC: Recent Labs  Lab 05/27/17 1316 05/28/17 0229  WBC 14.8* 19.5*  NEUTROABS 12.7*  --   HGB 12.4 12.3  HCT 37.6 37.7  MCV 95.7 95.7  PLT 282 409   Basic Metabolic Panel: Recent Labs  Lab 05/27/17 1316 05/27/17 1957 05/28/17 0229  NA 133*  --  138  K 3.0*  --  2.7*  CL 86*  --  93*  CO2 32  --  32  GLUCOSE 199*  --  177*  BUN 13  --  11  CREATININE 0.67  --  0.85    CALCIUM 5.9*  --  6.2*  MG  --  0.2* 0.9*  PHOS  --  5.6*  --    Liver Function Tests: Recent Labs  Lab 05/27/17 1316 05/28/17 0229  AST 37 55*  ALT 13* 16  ALKPHOS 77 70  BILITOT 1.1 1.1  PROT 6.4* 5.9*  ALBUMIN 3.8 3.4*   CBG: Recent Labs  Lab 05/27/17 1225 05/27/17 2201 05/28/17 0737  GLUCAP 186* 151* 144*   Thyroid Function Tests: Recent Labs    05/27/17 1957  TSH 2.394   Urine analysis:    Component Value Date/Time   COLORURINE YELLOW 05/27/2017 1410   APPEARANCEUR CLEAR 05/27/2017 1410   LABSPEC 1.020 05/27/2017 1410   PHURINE 7.0 05/27/2017 1410   GLUCOSEU NEGATIVE 05/27/2017 1410   HGBUR NEGATIVE 05/27/2017 1410   BILIRUBINUR NEGATIVE 05/27/2017 1410   KETONESUR >80 (A) 05/27/2017 1410   PROTEINUR 100 (A) 05/27/2017 1410   UROBILINOGEN 0.2 03/31/2013 2006   NITRITE NEGATIVE 05/27/2017 1410   LEUKOCYTESUR NEGATIVE 05/27/2017 1410    Radiology Studies: Ct Abdomen Pelvis W Contrast  Result Date: 05/27/2017 CLINICAL DATA:  Nausea vomiting for 4 days.  Mid abdominal pain. EXAM: CT ABDOMEN AND PELVIS WITH CONTRAST TECHNIQUE: Multidetector CT imaging of the abdomen and pelvis was performed using the standard protocol following bolus administration of intravenous contrast. CONTRAST:  119m ISOVUE-300 IOPAMIDOL (ISOVUE-300) INJECTION 61% COMPARISON:  None. FINDINGS: Lower chest: No acute abnormality. Rim calcified mass within the left breast, query postsurgical. Hepatobiliary: No focal liver abnormality is seen. Status post cholecystectomy. No biliary dilatation. Pancreas: Unremarkable. No pancreatic ductal dilatation or surrounding inflammatory changes. Spleen: Normal in size without focal abnormality. Adrenals/Urinary Tract: Intermediate density 1.9 cm right adrenal nodule. Kidneys are normal, without renal calculi, focal lesion, or hydronephrosis. Bladder is unremarkable. Stomach/Bowel: Mild mucosal thickening of the distal gastric body, image 41/92, sequence 2.  No evidence of small-bowel obstruction. Normal appendix. Extensive colonic diverticulosis, particularly of the sigmoid colon. Mucosal thickening of the sigmoid colon may represent an element of diverticulitis. Vascular/Lymphatic: Aortic atherosclerosis. No enlarged abdominal or pelvic lymph nodes. Reproductive: Status post hysterectomy. No adnexal masses. Other: No abdominal wall hernia or abnormality. No abdominopelvic ascites. Musculoskeletal: Multilevel osteoarthritic changes of the spine. Left total hip arthroplasty. IMPRESSION: Indeterminate 1.9 cm right adrenal nodule. If further evaluation is clinically desired, abdominal CT or MRI, adrenal protocol should be followed. Circumferential mucosal thickening of the distal gastric body. This may represent inflammatory changes/ hypertrophy of the gastric mucosa. Infiltrating mass however is on the differential diagnosis. Extensive colonic diverticulosis with probable sigmoid diverticulitis. Please correlate to results of colonoscopy to exclude underlying mass. Electronically Signed   By: DFidela SalisburyM.D.   On: 05/27/2017 15:53   Dg Chest Port 1 View  Result Date: 05/27/2017 CLINICAL DATA:  Hypotension with dizziness  EXAM: PORTABLE CHEST 1 VIEW COMPARISON:  CT chest March 31, 2013 FINDINGS: There is a calcified granuloma in the right lower lobe. There is no edema or consolidation. Heart size and pulmonary vascularity are within normal limits. No adenopathy. There is aortic atherosclerosis. There is advanced arthropathy in both shoulders. IMPRESSION: No edema or consolidation.  Calcified granuloma right lower lobe. Heart size within normal limits.  There is aortic atherosclerosis. Advanced arthropathy in both shoulders. Aortic Atherosclerosis (ICD10-I70.0). Electronically Signed   By: Lowella Grip III M.D.   On: 05/27/2017 13:02    Scheduled Meds: . DULoxetine  60 mg Oral Daily  . febuxostat  40 mg Oral Daily  . feeding supplement  1  Container Oral TID BM  . insulin aspart  0-15 Units Subcutaneous TID WC  . insulin aspart  0-5 Units Subcutaneous QHS  . levothyroxine  100 mcg Oral QAC breakfast  . metoprolol  100 mg Oral QHS  . montelukast  10 mg Oral QHS  . pantoprazole  40 mg Oral Daily  . potassium chloride  40 mEq Oral BID  . Rivaroxaban  15 mg Oral QHS  . spironolactone  50 mg Oral Daily   Continuous Infusions: . sodium chloride 75 mL/hr at 05/27/17 1929  . cefTRIAXone (ROCEPHIN)  IV    . metronidazole Stopped (05/28/17 0348)  . sodium chloride       LOS: 1 day   Time spent: 25 minutes   Faye Ramsay, MD [facility] Pager [phone]  If 7PM-7AM, please contact night-coverage [email or web address] Password [email or web address] 05/28/2017, 9:04 AM

## 2017-05-28 NOTE — Progress Notes (Signed)
Initial Nutrition Assessment  DOCUMENTATION CODES:   Obesity unspecified  INTERVENTION:    Monitor and supplement electrolytes as needed per MD descretion.   Monitor for diet advancement/toleration  Provide Premier Protein BID, each supplement provides 160kcal and 30g protein.   NUTRITION DIAGNOSIS:   Inadequate oral intake related to nausea, vomiting as evidenced by per patient/family report.  GOAL:   Patient will meet greater than or equal to 90% of their needs  MONITOR:   PO intake, Supplement acceptance, Weight trends, Labs, Diet advancement  REASON FOR ASSESSMENT:   Consult Assessment of nutrition requirement/status  ASSESSMENT:   Pt with PMH significant for breast cancer s/p lumpectomy/radiation 2016, depression, HLD, HTN, PE, and DM. Presents this admission with hypocalcemia from unknown etiology and mucosal thickening of gastric body showing probable sigmoid diverticulitis.    Pt likely going for EGD with biopsies.  Reports having a loss of appetite for 2 weeks prior to admission related to a new medication prescription.  States after being prescribed Cialis for pulmonary HTN, she began throwing up all things taken PO.  Pt tolerating full liquids at this time. Amendable to supplementation this hospital stay.  Reports she has lost 15 lb over the last year intentionally. Does not suspect she has lost weight within the last two weeks.  Records are limited in weight history.  Nutrition-Focused physical exam completed. See findings below.   Monitor and supplement electrolytes as needed per MD descretion.   Medications reviewed and include: SSI, NS @ 75 ml/hr, IV abx Labs reviewed: K 3.2 (L) Calcium 6.4 (L) Phosphorus 5.6 (H) Mg 0.2 (L)   NUTRITION - FOCUSED PHYSICAL EXAM:    Most Recent Value  Orbital Region  No depletion  Upper Arm Region  No depletion  Thoracic and Lumbar Region  Unable to assess  Buccal Region  No depletion  Temple Region  No depletion   Clavicle Bone Region  No depletion  Clavicle and Acromion Bone Region  No depletion  Scapular Bone Region  Unable to assess  Dorsal Hand  No depletion  Patellar Region  No depletion  Anterior Thigh Region  No depletion  Posterior Calf Region  No depletion  Edema (RD Assessment)  None  Hair  Reviewed  Eyes  Reviewed  Mouth  Reviewed  Skin  Reviewed  Nails  Reviewed       Diet Order:  Diet full liquid Room service appropriate? Yes; Fluid consistency: Thin  EDUCATION NEEDS:   Not appropriate for education at this time  Skin:  Skin Assessment: Reviewed RN Assessment  Last BM:  05/26/17  Height:   Ht Readings from Last 1 Encounters:  05/27/17 _0  (1.549 m)    Weight:   Wt Readings from Last 1 Encounters:  05/28/17 158 lb 15.2 oz (72.1 kg)    Ideal Body Weight:  47.7 kg  BMI:  Body mass index is 30.03 kg/m.  Estimated Nutritional Needs:   Kcal:  1400-1600 kcal/day  Protein:  70-80 g/day  Fluid:  >1.4 L/day    Mariana Single RD, LDN Clinical Nutrition Pager # - [phone]

## 2017-05-28 NOTE — Progress Notes (Signed)
OT Cancellation Note  Patient Details Name: Amanda Short MRN: [id number] DOB: 1940/12/09   Cancelled Treatment:    Reason Eval/Treat Not Completed: Fatigue/lethargy limiting ability to participate  Britt Bottom 05/28/2017, 2:36 PM

## 2017-05-28 NOTE — Progress Notes (Signed)
CRITICAL VALUE ALERT  Critical Value:  6.4  Date & Time Notied: 11/12 0920  Provider Notified: Doyle Askew  Orders Received/Actions taken: yes. Administer Ca+

## 2017-05-28 NOTE — Progress Notes (Signed)
CRITICAL VALUE ALERT  Critical Value:  K- 2.7, Ca- 6.2, Mag, 0.9  Date & Time Notied:  05/28/17  Provider Notified: Hal Hope  Orders Received/Actions taken: 80 mg K PO, 2 gr Mag IV, 1 gr Ca IV

## 2017-05-28 NOTE — Consult Note (Signed)
Referring Provider: [facility]  Primary Care Physician:  System, [facility] In In [place], [name] Primary Gastroenterologist: unassigned  Reason for Consultation:  Abnormal stomach on CT scan. Nausea / vomiting and diarrhea.   ASSESSMENT AND PLAN:    1. 76 yo female admitted with nausea / vomiting and diffuse mid upper abdominal discomfort over the last month. She correlates onset of symptoms with initiation of Cialis for pulmonary HTN though CT scan suggesting circumferential thickening of distal gastric body. She is on chronic BID PPI at home -For further evaluation patient will likely need EGD with possible biopsies. Her last dose of Xarelto was yesterday.    2. Diverticulosis, CT scan suggests "probable" sigmoid diverticulitis. She isn't really tender on exam. No significant lower abdominal pain.  Last colonoscopy in [place] ~ 6 years ago. Per patient, exam was normal. Told to have repeat in 10 years.  -on Rocephin and flagyl.   3. Limited diarrhea. Only two episodes and none in last several days. Says she is having normal formed stools over last few days. No recent antibiotics.  -given absence of diarrhea I d/c ed C-diff. Patient knows to let nurse know if she does have any loose BMs and then we can send for c-diff.   4. Leukocytosis. WBC 14.8 up to ~ 19 today. U/A unremarkable. CXR unremarkable. Workup in progress. Being treated empirically for diverticulitis.   5. Hypocalcemia, repleted. Workup in process  6. Hx of PE, home Xarelto on hold.  7. GERD, asymptomatic on BID PPI   HPI: Amanda Short is a 76 y.o. female with a hx of OSA on CPAP, DM2, breast cancer and PE, on chronic Xarelto. She was brought to ED yesterday by EMS for nausea / vomiting / dizziness  Per EMS she was hypotensive, resuscitated with IVF in route to ED. EKG >> sinus rhythm with incomplete RBBB, prolonged QTC in setting of hypokalemia and hypocalcemia.She started Cialis a month ago for pulmonary HTN.  Approx 5 days after starting Cialis  She began having nausea and vomiting almost on a daily basis. She also admits to non-radiating mid upper abdominal "ache" over the last month. The discomfort is not related to eating or anything else. She last lost ~ 15 pounds over the last year but some of that was intentional. Over the last month she had two episodes of diarrhea but stools have o/w been normal. She hasn't had any loose stool in days. No fevers. the last month she has had N/V/D. WBC 14. 8. CT scan reveals circumferential thickening of distal gastric body, extensive diverticulosis and probable sigmoid diverticulitis. Also an indeterminate 1.9 cm right adrenal nodule. She doesn't take NSAIDs. Sounds like she had an upper GI bleed years ago after taking Naproxen. She has chronic GERD, asymptomatic on BID PPI   Past Medical History:  Diagnosis Date  . Arthritis    "back, hands" (07/22/2015)  . Cancer of left breast ([place]) 2006   S/P lumpectomy  . Complication of anesthesia    "brief breathing problem at surgery center in ~ 2005 when I had gallbladder OR"  . Depression   . DVT (deep venous thrombosis) ([place] [facility]) 03/2013   LLE  . GERD (gastroesophageal reflux disease)   . Hyperlipidemia   . Hypertension   . Hypothyroidism   . OSA treated with BiPAP   . Pulmonary embolism ([place]) 03/2013  . Thyroid disease    Hypothyroid  . Type II diabetes mellitus ([place])     Past Surgical History:  Procedure Laterality Date  . ABDOMINAL HYSTERECTOMY  1970s  . BREAST BIOPSY Left 2006  . BREAST LUMPECTOMY Left 2006  . BUNIONECTOMY WITH HAMMERTOE RECONSTRUCTION Left   . JOINT REPLACEMENT    . LAPAROSCOPIC CHOLECYSTECTOMY  ~ 2005  . TOTAL HIP ARTHROPLASTY Left 2010  . TOTAL KNEE ARTHROPLASTY Bilateral 2005-2006    Prior to Admission medications   Medication Sig Start Date End Date Taking? Authorizing Provider  DULoxetine (CYMBALTA) 60 MG capsule Take 60 mg by mouth daily.   Yes [provider]    febuxostat (ULORIC) 40 MG tablet Take 40 mg daily by mouth.   Yes [provider]  furosemide (LASIX) 40 MG tablet Take 40 mg by mouth daily.   Yes [provider]  levothyroxine (SYNTHROID, LEVOTHROID) 100 MCG tablet Take 100 mcg by mouth daily before breakfast.   Yes [provider]  metFORMIN (GLUCOPHAGE) 500 MG tablet Take 1,000 mg daily by mouth. Take 1000 mg in the morning and 500 mg in the evening    Yes [provider]  metoprolol (TOPROL-XL) 200 MG 24 hr tablet Take 100 mg by mouth at bedtime.    Yes [provider]  montelukast (SINGULAIR) 10 MG tablet Take 10 mg at bedtime by mouth.   Yes [provider]  Multiple Vitamin (MULTIVITAMIN WITH MINERALS) TABS tablet Take 1 tablet daily by mouth.   Yes [provider]  omeprazole (PRILOSEC) 40 MG capsule Take 40 mg by mouth 2 (two) times daily.   Yes [provider]  Rivaroxaban (XARELTO PO) Take 15 mg by mouth at bedtime.    Yes [provider]  spironolactone (ALDACTONE) 25 MG tablet Take 50 mg daily by mouth.   Yes [provider]  traMADol (ULTRAM) 50 MG tablet Take 50 mg every 4 (four) hours as needed by mouth for pain.   Yes [provider]  atorvastatin (LIPITOR) 20 MG tablet Take 1 tablet (20 mg total) by mouth daily at 6 PM. Patient not taking: Reported on 05/27/2017 04/04/13   Regalado, Jerald Kief A, MD  cephALEXin (KEFLEX) 500 MG capsule Take 1 capsule (500 mg total) by mouth 4 (four) times daily. Patient not taking: Reported on 05/27/2017 07/23/15   Kelvin Cellar, MD    Current Facility-Administered Medications  Medication Dose Route Frequency Provider Last Rate Last Dose  . 0.9 %  sodium chloride infusion   Intravenous Continuous Donne Hazel, MD 75 mL/hr at 05/27/17 1929    . acetaminophen (TYLENOL) tablet 650 mg  650 mg Oral Q6H PRN Donne Hazel, MD       Or  . acetaminophen (TYLENOL) suppository 650 mg  650 mg Rectal Q6H  PRN Donne Hazel, MD      . calcium gluconate 1 g in sodium chloride 0.9 % 100 mL IVPB  1 g Intravenous Once Theodis Blaze, MD      . cefTRIAXone (ROCEPHIN) 2 g in dextrose 5 % 50 mL IVPB  2 g Intravenous Q24H Donne Hazel, MD      . DULoxetine (CYMBALTA) DR capsule 60 mg  60 mg Oral Daily Donne Hazel, MD   60 mg at 05/28/17 0945  . febuxostat (ULORIC) tablet 40 mg  40 mg Oral Daily Donne Hazel, MD   40 mg at 05/28/17 0945  . feeding supplement (BOOST / RESOURCE BREEZE) liquid 1 Container  1 Container Oral TID BM Donne Hazel, MD   1 Container at 05/28/17 [phone]  .  insulin aspart (novoLOG) injection 0-15 Units  0-15 Units Subcutaneous TID WC Donne Hazel, MD   2 Units at 05/28/17 0900  . insulin aspart (novoLOG) injection 0-5 Units  0-5 Units Subcutaneous QHS Donne Hazel, MD      . ketorolac (TORADOL) 15 MG/ML injection 15 mg  15 mg Intravenous Q6H PRN Donne Hazel, MD      . levothyroxine (SYNTHROID, LEVOTHROID) tablet 100 mcg  100 mcg Oral QAC breakfast Donne Hazel, MD   100 mcg at 05/27/17 2055  . metoprolol succinate (TOPROL-XL) 24 hr tablet 100 mg  100 mg Oral QHS Donne Hazel, MD   100 mg at 05/28/17 0109  . metroNIDAZOLE (FLAGYL) IVPB 500 mg  500 mg Intravenous Q8H Donne Hazel, MD 100 mL/hr at 05/28/17 0936 500 mg at 05/28/17 0936  . montelukast (SINGULAIR) tablet 10 mg  10 mg Oral QHS Donne Hazel, MD   10 mg at 05/28/17 0110  . pantoprazole (PROTONIX) EC tablet 40 mg  40 mg Oral Daily Donne Hazel, MD   40 mg at 05/28/17 0944  . Rivaroxaban (XARELTO) tablet 15 mg  15 mg Oral QHS Donne Hazel, MD   15 mg at 05/28/17 0110  . sodium chloride 0.9 % bolus 250 mL  250 mL Intravenous Once Donne Hazel, MD      . spironolactone (ALDACTONE) tablet 50 mg  50 mg Oral Daily Donne Hazel, MD   50 mg at 05/27/17 2055    Allergies as of 05/27/2017 - Review Complete 05/27/2017  Allergen Reaction Noted  . Allopurinol Nausea And Vomiting 05/27/2017     Family History  Problem Relation Age of Onset  . Diabetes Unknown   . Diabetes Mellitus II Sister     Social History   Socioeconomic History  . Marital status: Married    Spouse name: Not on file  . Number of children: Not on file  . Years of education: Not on file  . Highest education level: Not on file  Social Needs  . Financial resource strain: Not on file  . Food insecurity - worry: Not on file  . Food insecurity - inability: Not on file  . Transportation needs - medical: Not on file  . Transportation needs - non-medical: Not on file  Occupational History  . Not on file  Tobacco Use  . Smoking status: Former Smoker    Packs/day: 1.00    Years: 30.00    Pack years: 30.00    Types: Cigarettes  . Smokeless tobacco: Never Used  . Tobacco comment: "quit smoking cigarettes in the 1990s"  Substance and Sexual Activity  . Alcohol use: No  . Drug use: No  . Sexual activity: Not Currently  Other Topics Concern  . Not on file  Social History Narrative  . Not on file    Review of Systems: All systems reviewed and negative except where noted in HPI.  Physical Exam: Vital signs in last 24 hours: Temp:  [97.9 F (36.6 C)-98.9 F (37.2 C)] 98.7 F (37.1 C) (11/12 0518) Pulse Rate:  [60-68] 65 (11/12 0518) Resp:  [18-21] 20 (11/12 0518) BP: (118-140)/(50-75) 118/50 (11/12 0518) SpO2:  [86 %-98 %] 93 % (11/12 0518) Weight:  [158 lb 15.2 oz (72.1 kg)-177 lb (80.3 kg)] 158 lb 15.2 oz (72.1 kg) (11/12 0518) Last BM Date: 05/26/17 General:   Alert, well-developed,  White female in NAD Psych:  Pleasant, cooperative. Normal mood and affect. Eyes:  Pupils equal, sclera clear, no icterus.   Conjunctiva pink. Ears:  Normal auditory acuity. Nose:  No deformity, discharge,  or lesions. Neck:  Supple; no masses Lungs:  Clear throughout to auscultation.   No wheezes, crackles, or rhonchi.  Heart:  Regular rate and rhythm; no murmurs, no edema Abdomen:  Soft, non-distended,  MILD mid upper abdominal tenderness.  BS active, no palp mass    Rectal:  Deferred  Msk:  Symmetrical without gross deformities. . Pulses:  Normal pulses noted. Neurologic:  Alert and  oriented x4;  grossly normal neurologically. Skin:  Intact without significant lesions or rashes..   Intake/Output from previous day: 11/11 0701 - 11/12 0700 In: 952.5 [I.V.:852.5; IV Piggyback:100] Out: 1050 [Urine:1050] Intake/Output this shift: No intake/output data recorded.  Lab Results: Recent Labs    05/27/17 1316 05/28/17 0229  WBC 14.8* 19.5*  HGB 12.4 12.3  HCT 37.6 37.7  PLT 282 307   BMET Recent Labs    05/27/17 1316 05/28/17 0229 05/28/17 0811  NA 133* 138 136  K 3.0* 2.7* 3.2*  CL 86* 93* 94*  CO2 32 32 32  GLUCOSE 199* 177* 179*  BUN _0 CREATININE 0.67 0.85 0.84  CALCIUM 5.9* 6.2* 6.4*   LFT Recent Labs    05/28/17 0811  PROT 5.8*  ALBUMIN 3.2*  AST 56*  ALT 17  ALKPHOS 74  BILITOT 1.0    Studies/Results: Ct Abdomen Pelvis W Contrast  Result Date: 05/27/2017 CLINICAL DATA:  Nausea vomiting for 4 days.  Mid abdominal pain. EXAM: CT ABDOMEN AND PELVIS WITH CONTRAST TECHNIQUE: Multidetector CT imaging of the abdomen and pelvis was performed using the standard protocol following bolus administration of intravenous contrast. CONTRAST:  162m ISOVUE-300 IOPAMIDOL (ISOVUE-300) INJECTION 61% COMPARISON:  None. FINDINGS: Lower chest: No acute abnormality. Rim calcified mass within the left breast, query postsurgical. Hepatobiliary: No focal liver abnormality is seen. Status post cholecystectomy. No biliary dilatation. Pancreas: Unremarkable. No pancreatic ductal dilatation or surrounding inflammatory changes. Spleen: Normal in size without focal abnormality. Adrenals/Urinary Tract: Intermediate density 1.9 cm right adrenal nodule. Kidneys are normal, without renal calculi, focal lesion, or hydronephrosis. Bladder is unremarkable. Stomach/Bowel: Mild mucosal  thickening of the distal gastric body, image 41/92, sequence 2. No evidence of small-bowel obstruction. Normal appendix. Extensive colonic diverticulosis, particularly of the sigmoid colon. Mucosal thickening of the sigmoid colon may represent an element of diverticulitis. Vascular/Lymphatic: Aortic atherosclerosis. No enlarged abdominal or pelvic lymph nodes. Reproductive: Status post hysterectomy. No adnexal masses. Other: No abdominal wall hernia or abnormality. No abdominopelvic ascites. Musculoskeletal: Multilevel osteoarthritic changes of the spine. Left total hip arthroplasty. IMPRESSION: Indeterminate 1.9 cm right adrenal nodule. If further evaluation is clinically desired, abdominal CT or MRI, adrenal protocol should be followed. Circumferential mucosal thickening of the distal gastric body. This may represent inflammatory changes/ hypertrophy of the gastric mucosa. Infiltrating mass however is on the differential diagnosis. Extensive colonic diverticulosis with probable sigmoid diverticulitis. Please correlate to results of colonoscopy to exclude underlying mass. Electronically Signed   By: DFidela SalisburyM.D.   On: 05/27/2017 15:53   Dg Chest Port 1 View  Result Date: 05/27/2017 CLINICAL DATA:  Hypotension with dizziness EXAM: PORTABLE CHEST 1 VIEW COMPARISON:  CT chest March 31, 2013 FINDINGS: There is a calcified granuloma in the right lower lobe. There is no edema or consolidation. Heart size and pulmonary vascularity are within normal limits. No adenopathy. There is aortic atherosclerosis. There is advanced arthropathy in both shoulders. IMPRESSION:  No edema or consolidation.  Calcified granuloma right lower lobe. Heart size within normal limits.  There is aortic atherosclerosis. Advanced arthropathy in both shoulders. Aortic Atherosclerosis (ICD10-I70.0). Electronically Signed   By: Lowella Grip III M.D.   On: 05/27/2017 13:02    Tye Savoy, NP-C @  05/28/2017, 10:03  AM  Pager number [phone]  GI ATTENDING  History, laboratories, x-rays reviewed. Patient personally seen and examined. Agree with comprehensive consultation note as outlined above. Patient presents with intractable nausea and vomiting as well as abdominal discomfort. Found to have multiple electrolyte abnormalities. These have been corrected along with hydration. Imaging suggests abnormality of the antrum. Inflammatory or infiltrative process should be excluded. Plans for upper endoscopy tomorrow.The nature of the procedure, as well as the risks, benefits, and alternatives were carefully and thoroughly reviewed with the patient. Ample time for discussion and questions allowed. The patient understood, was satisfied, and agreed to proceed.. Also, question diverticulitis. Agree with antibiotic therapy given leukocytosis.  Docia Chuck. Geri Seminole., M.D. [facility] Division of Gastroenterology

## 2017-05-28 NOTE — Plan of Care (Signed)
Pt condition improving

## 2017-05-28 NOTE — Evaluation (Signed)
Physical Therapy Evaluation Patient Details Name: Amanda Short MRN: [id number] DOB: Sep 01, 1940 Today's Date: 05/28/2017   History of Present Illness  76 year old female with known breast cancer status post lumpectomy and radiation therapy in 2006, history of DVT and PE on chronic Xarelto, hypertension and hyperlipidemia, diabetes type 2, recently diagnosed pulmonary hypertension and admitted for Sigmoid diverticulitis  Clinical Impression  Pt admitted with above diagnosis. Pt currently with functional limitations due to the deficits listed below (see PT Problem List). Pt will benefit from skilled PT to increase their independence and safety with mobility to allow discharge to the venue listed below.  Pt assisted with ambulating in hallway and presents with very unsteady and uncoordinated gait.  Pt also requiring supplemental oxygen at this time.  Spouse and pt would like pt to return home upon d/c.     Follow Up Recommendations Home health PT;Supervision/Assistance - 24 hour    Equipment Recommendations  None recommended by PT    Recommendations for Other Services       Precautions / Restrictions Precautions Precautions: Fall Precaution Comments: monitor sats      Mobility  Bed Mobility Overal bed mobility: Needs Assistance Bed Mobility: Supine to Sit     Supine to sit: Min guard;HOB elevated     General bed mobility comments: increased time and effort  Transfers Overall transfer level: Needs assistance Equipment used: Rolling walker (2 wheeled) Transfers: Sit to/from Stand Sit to Stand: Min assist         General transfer comment: assist to rise and steady, verbal cues for hand placement, SpO2 dropped to 87% on room air upon sitting so reapplied O2 Reserve  Ambulation/Gait Ambulation/Gait assistance: Min assist Ambulation Distance (Feet): 160 Feet Assistive device: Rolling walker (2 wheeled) Gait Pattern/deviations: Step-through pattern;Decreased stride  length Gait velocity: decreased   General Gait Details: verbal cues for RW positioning, unsteady and uncoordinated gait, Spo2 80% on 2L O2 upon returning to room, briefly increased to 3L O2 for recovery and SPO2 92%  Stairs            Wheelchair Mobility    Modified Rankin (Stroke Patients Only)       Balance Overall balance assessment: Needs assistance;History of Falls         Standing balance support: Bilateral upper extremity supported Standing balance-Leahy Scale: Poor Standing balance comment: requires UE support                             Pertinent Vitals/Pain Pain Assessment: No/denies pain    Home Living Family/patient expects to be discharged to:: Private residence Living Arrangements: Spouse/significant other Available Help at Discharge: Family   Home Access: Level entry     Home Layout: One level Home Equipment: Environmental consultant - 2 wheels      Prior Function Level of Independence: Independent               Hand Dominance        Extremity/Trunk Assessment        Lower Extremity Assessment Lower Extremity Assessment: Generalized weakness;RLE deficits/detail;LLE deficits/detail RLE Coordination: decreased gross motor LLE Coordination: decreased gross motor       Communication   Communication: No difficulties  Cognition Arousal/Alertness: Awake/alert Behavior During Therapy: WFL for tasks assessed/performed Overall Cognitive Status: Within Functional Limits for tasks assessed  General Comments      Exercises     Assessment/Plan    PT Assessment Patient needs continued PT services  PT Problem List Decreased strength;Decreased coordination;Decreased activity tolerance;Decreased mobility;Decreased balance;Decreased knowledge of use of DME;Cardiopulmonary status limiting activity       PT Treatment Interventions Gait training;DME instruction;Therapeutic  activities;Therapeutic exercise;Functional mobility training;Balance training;Patient/family education    PT Goals (Current goals can be found in the Care Plan section)  Acute Rehab PT Goals PT Goal Formulation: With patient Time For Goal Achievement: 06/11/17 Potential to Achieve Goals: Good    Frequency Min 3X/week   Barriers to discharge        Co-evaluation               AM-PAC PT "6 Clicks" Daily Activity  Outcome Measure Difficulty turning over in bed (including adjusting bedclothes, sheets and blankets)?: A Little Difficulty moving from lying on back to sitting on the side of the bed? : A Lot Difficulty sitting down on and standing up from a chair with arms (Short.g., wheelchair, bedside commode, etc,.)?: Unable Help needed moving to and from a bed to chair (including a wheelchair)?: A Little Help needed walking in hospital room?: A Little Help needed climbing 3-5 steps with a railing? : A Lot 6 Click Score: 14    End of Session Equipment Utilized During Treatment: Gait belt;Oxygen Activity Tolerance: Patient tolerated treatment well Patient left: in chair;with call bell/phone within reach;with family/visitor present   PT Visit Diagnosis: Other abnormalities of gait and mobility (R26.89)    Time: 2336-1224 PT Time Calculation (min) (ACUTE ONLY): 25 min   Charges:   PT Evaluation $PT Eval Moderate Complexity: 1 Mod     PT G Codes:        Amanda Short, PT, DPT 05/28/2017 Pager: [phone]  Amanda Short 05/28/2017, 4:39 PM

## 2017-05-29 ENCOUNTER — Inpatient Hospital Stay (HOSPITAL_COMMUNITY): Payer: Medicare Other | Admitting: Registered Nurse

## 2017-05-29 ENCOUNTER — Encounter (HOSPITAL_COMMUNITY)
Admission: EM | Disposition: A | Discharge status: Home Health | Payer: Self-pay | Source: Home / Self Care | Attending: Internal Medicine

## 2017-05-29 ENCOUNTER — Encounter (HOSPITAL_COMMUNITY): Payer: Self-pay | Admitting: *Deleted

## 2017-05-29 DIAGNOSIS — R1115 Cyclical vomiting syndrome unrelated to migraine: Secondary | ICD-10-CM

## 2017-05-29 DIAGNOSIS — R935 Abnormal findings on diagnostic imaging of other abdominal regions, including retroperitoneum: Secondary | ICD-10-CM

## 2017-05-29 DIAGNOSIS — R109 Unspecified abdominal pain: Secondary | ICD-10-CM

## 2017-05-29 HISTORY — PX: ESOPHAGOGASTRODUODENOSCOPY: SHX5428

## 2017-05-29 LAB — COMPREHENSIVE METABOLIC PANEL
ALBUMIN: 3.3 g/dL — AB (ref 3.5–5.0)
ALK PHOS: 71 U/L (ref 38–126)
ALT: 18 U/L (ref 14–54)
ANION GAP: 7 (ref 5–15)
AST: 40 U/L (ref 15–41)
BILIRUBIN TOTAL: 0.6 mg/dL (ref 0.3–1.2)
BUN: 12 mg/dL (ref 6–20)
CALCIUM: 6.3 mg/dL — AB (ref 8.9–10.3)
CO2: 28 mmol/L (ref 22–32)
Chloride: 102 mmol/L (ref 101–111)
Creatinine, Ser: 0.64 mg/dL (ref 0.44–1.00)
GFR calc non Af Amer: >60 mL/min (ref >60)
GLUCOSE: 104 mg/dL — AB (ref 65–99)
POTASSIUM: 4.2 mmol/L (ref 3.5–5.1)
SODIUM: 137 mmol/L (ref 135–145)
TOTAL PROTEIN: 5.8 g/dL — AB (ref 6.5–8.1)

## 2017-05-29 LAB — CBC
HCT: 37.6 % (ref 36.0–46.0)
Hemoglobin: 11.9 g/dL — ABNORMAL LOW (ref 12.0–15.0)
MCH: 31.2 pg (ref 26.0–34.0)
MCHC: 31.6 g/dL (ref 30.0–36.0)
MCV: 98.4 fL (ref 78.0–100.0)
Platelets: 300 10*3/uL (ref 150–400)
RBC: 3.82 MIL/uL — ABNORMAL LOW (ref 3.87–5.11)
RDW: 14.1 % (ref 11.5–15.5)
WBC: 12.8 10*3/uL — ABNORMAL HIGH (ref 4.0–10.5)

## 2017-05-29 LAB — GLUCOSE, CAPILLARY
GLUCOSE-CAPILLARY: 210 mg/dL — AB (ref 65–99)
GLUCOSE-CAPILLARY: 124 mg/dL — AB (ref 65–99)
Glucose-Capillary: 138 mg/dL — ABNORMAL HIGH (ref 65–99)
Glucose-Capillary: 146 mg/dL — ABNORMAL HIGH (ref 65–99)

## 2017-05-29 LAB — PARATHYROID HORMONE, INTACT (NO CA): PTH: 14 pg/mL — AB (ref 15–65)

## 2017-05-29 LAB — MAGNESIUM: MAGNESIUM: 2.3 mg/dL (ref 1.7–2.4)

## 2017-05-29 LAB — VITAMIN D 25 HYDROXY (VIT D DEFICIENCY, FRACTURES): Vit D, 25-Hydroxy: 35.4 ng/mL (ref 30.0–100.0)

## 2017-05-29 LAB — PHOSPHORUS: Phosphorus: 3.5 mg/dL (ref 2.5–4.6)

## 2017-05-29 SURGERY — EGD (ESOPHAGOGASTRODUODENOSCOPY)
Anesthesia: Monitor Anesthesia Care

## 2017-05-29 MED ORDER — LIDOCAINE 2% (20 MG/ML) 5 ML SYRINGE
INTRAMUSCULAR | Status: DC | PRN
  Administered 2017-05-29: 100 mg via INTRAVENOUS

## 2017-05-29 MED ORDER — SODIUM CHLORIDE 0.9 % IV SOLN
1.0000 g | Freq: Once | INTRAVENOUS | Status: AC
  Administered 2017-05-29: 1 g via INTRAVENOUS
  Filled 2017-05-29: qty 10

## 2017-05-29 MED ORDER — PROPOFOL 10 MG/ML IV BOLUS
INTRAVENOUS | Status: AC
  Filled 2017-05-29: qty 40

## 2017-05-29 MED ORDER — ALBUTEROL SULFATE (2.5 MG/3ML) 0.083% IN NEBU
INHALATION_SOLUTION | RESPIRATORY_TRACT | Status: AC
  Filled 2017-05-29: qty 3

## 2017-05-29 MED ORDER — PROPOFOL 500 MG/50ML IV EMUL
INTRAVENOUS | Status: DC | PRN
  Administered 2017-05-29: 100 ug/kg/min via INTRAVENOUS

## 2017-05-29 MED ORDER — PROPOFOL 10 MG/ML IV BOLUS
INTRAVENOUS | Status: DC | PRN
  Administered 2017-05-29 (×2): 20 mg via INTRAVENOUS

## 2017-05-29 MED ORDER — LIDOCAINE 2% (20 MG/ML) 5 ML SYRINGE
INTRAMUSCULAR | Status: AC
  Filled 2017-05-29: qty 5

## 2017-05-29 MED ORDER — ALBUTEROL SULFATE (2.5 MG/3ML) 0.083% IN NEBU
2.5000 mg | INHALATION_SOLUTION | Freq: Once | RESPIRATORY_TRACT | Status: AC
  Administered 2017-05-29: 2.5 mg via RESPIRATORY_TRACT

## 2017-05-29 MED ORDER — ALBUTEROL SULFATE HFA 108 (90 BASE) MCG/ACT IN AERS
INHALATION_SPRAY | RESPIRATORY_TRACT | Status: DC | PRN
  Administered 2017-05-29: 2 via RESPIRATORY_TRACT

## 2017-05-29 MED ORDER — ONDANSETRON HCL 4 MG/2ML IJ SOLN
INTRAMUSCULAR | Status: AC
  Filled 2017-05-29: qty 2

## 2017-05-29 NOTE — Anesthesia Postprocedure Evaluation (Signed)
Anesthesia Post Note  Patient: Amanda Short  Procedure(s) Performed: ESOPHAGOGASTRODUODENOSCOPY (EGD) (N/A )     Patient location during evaluation: Endoscopy Anesthesia Type: MAC Level of consciousness: awake and alert, oriented and patient cooperative Pain management: pain level controlled Vital Signs Assessment: post-procedure vital signs reviewed and stable Respiratory status: nonlabored ventilation, spontaneous breathing and respiratory function stable Cardiovascular status: blood pressure returned to baseline and stable Postop Assessment: no apparent nausea or vomiting Anesthetic complications: no    Last Vitals:  Vitals:   05/29/17 1010 05/29/17 1035  BP:  (!) 157/80  Pulse: 64 66  Resp: 19 20  Temp:  37 C  SpO2: 93% 94%    Last Pain:  Vitals:   05/29/17 1035  TempSrc: Oral  PainSc:                  Ryman Rathgeber,E. Clyde Zarrella

## 2017-05-29 NOTE — Progress Notes (Signed)
Patient c/o vision being altered after EGD today. States that images are clear, but they are turned on their side. Also reports that her depth perception is off. It was noted that the patient was very unsteady and not balanced just getting up to the bedside commode after the EGD. She required two people, one holding her on both sides and she was still very unsteady and uncoordinated. Pupils are equal and reactive to light. Patient is alert and oriented and able to recall conversations from earlier today. MD made aware. Will continue to monitor.  Othella Boyer [facility] 05/29/2017 6:13 PM

## 2017-05-29 NOTE — Progress Notes (Signed)
OT Cancellation Note  Patient Details Name: Amanda Short MRN: [id number] DOB: 1940-08-13   Cancelled Treatment:    Reason Eval/Treat Not Completed: Patient at procedure or test/ unavailable  Iain Sawchuk 05/29/2017, 9:11 AM  Lesle Chris, OTR/L [phone] 05/29/2017

## 2017-05-29 NOTE — Anesthesia Preprocedure Evaluation (Addendum)
Anesthesia Evaluation  Patient identified by MRN, date of birth, ID band Patient awake    Reviewed: Allergy & Precautions, NPO status , Patient's Chart, lab work & pertinent test results, reviewed documented beta blocker date and time   History of Anesthesia Complications Negative for: history of anesthetic complications  Airway Mallampati: II  TM Distance: >3 FB Neck ROM: Full    Dental  (+) Dental Advisory Given   Pulmonary sleep apnea, Continuous Positive Airway Pressure Ventilation and Oxygen sleep apnea , COPD,  oxygen dependent, former smoker, PE    + wheezing (treated with albuterol MDI)      Cardiovascular hypertension, Pt. on medications and Pt. on home beta blockers (-) angina+ Peripheral Vascular Disease and + DVT   Rhythm:Regular Rate:Normal  '14 ECHO: EF 55%, mod pulm HTN   Neuro/Psych negative neurological ROS     GI/Hepatic Neg liver ROS, GERD  Medicated and Controlled,  Endo/Other  diabetes (glu 138), Oral Hypoglycemic AgentsHypothyroidism Morbid obesity  Renal/GU negative Renal ROS     Musculoskeletal  (+) Arthritis ,   Abdominal (+) + obese,   Peds  Hematology xarelto   Anesthesia Other Findings Breast cancer  Reproductive/Obstetrics                           Anesthesia Physical Anesthesia Plan  ASA: III  Anesthesia Plan: MAC   Post-op Pain Management:    Induction: Intravenous  PONV Risk Score and Plan: Treatment may vary due to age or medical condition  Airway Management Planned: Natural Airway and Nasal Cannula  Additional Equipment:   Intra-op Plan:   Post-operative Plan:   Informed Consent: I have reviewed the patients History and Physical, chart, labs and discussed the procedure including the risks, benefits and alternatives for the proposed anesthesia with the patient or authorized representative who has indicated his/her understanding and acceptance.    Dental advisory given  Plan Discussed with: CRNA and Surgeon  Anesthesia Plan Comments: (Plan routine monitors, MAC)        Anesthesia Quick Evaluation

## 2017-05-29 NOTE — Plan of Care (Signed)
Pt's lab results are trending toward normal range

## 2017-05-29 NOTE — Transfer of Care (Signed)
Immediate Anesthesia Transfer of Care Note  Patient: Vega Casanova  Procedure(s) Performed: ESOPHAGOGASTRODUODENOSCOPY (EGD) (N/A )  Patient Location: PACU and Endoscopy Unit  Anesthesia Type:MAC  Level of Consciousness: awake, alert , oriented and patient cooperative  Airway & Oxygen Therapy: Patient Spontanous Breathing and Patient connected to nasal cannula oxygen  Post-op Assessment: Report given to RN, Post -op Vital signs reviewed and stable and Patient moving all extremities  Post vital signs: Reviewed and stable  Last Vitals:  Vitals:   05/29/17 0805 05/29/17 0945  BP: (!) 174/82 (!) 121/49  Pulse: 66 64  Resp: 20 (!) 24  Temp: 36.8 C   SpO2: 96% 93%    Last Pain:  Vitals:   05/29/17 0945  TempSrc: Oral  PainSc:          Complications: No apparent anesthesia complications

## 2017-05-29 NOTE — Interval H&P Note (Signed)
History and Physical Interval Note:  05/29/2017 9:18 AM  Amanda Short  has presented today for surgery, with the diagnosis of nausea, vomiting , abdominal distal stomach on CT scan  The various methods of treatment have been discussed with the patient and family. After consideration of risks, benefits and other options for treatment, the patient has consented to  Procedure(s): ESOPHAGOGASTRODUODENOSCOPY (EGD) (N/A) as a surgical intervention .  The patient's history has been reviewed, patient examined, no change in status, stable for surgery.  I have reviewed the patient's chart and labs.  Questions were answered to the patient's satisfaction.     [name]

## 2017-05-29 NOTE — Progress Notes (Signed)
Patient ID: Amanda Short, female   DOB: 1940-08-23, 76 y.o.   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 Blood work was also notable for severe electrolyte disturbance including calcium of 5.9, K 3, magnesium <1, QT prolongation noted on EKG.  On further questioning, patient reported nausea and poor oral intake, nonbloody diarrhea and abdominal cramping shortly after she was started on Cialis for pulmonary hypertension about a month ago.  Assessment & Plan:   Active Problems:   Sigmoid diverticulitis - cont Cipro and Flagyl, follow up on GI team recommendations - EGD done this AM, revealed several small < 1 cm benign polyps, mild antral erythema, rest of the duodenum normal - recommendation is to advance diet and continue ABX for total 7 days for diverticulitis     Hypocalcemia  - Unclear etiology, ? From diarrhea in the setting of diverticulitis  - still low, continue to supplement - repeat in AM     Prolongation of QTc - in the setting of electrolyte depletion including calcium, potassium, magnesium - 12 leak EKG this AM    Hypomagnesemia, hypokalemia - Possibly in the setting of ongoing diarrhea, diverticulitis - supplemented and WNL     Diabetes type 2 - Patient on metformin at home, we are holding for now - Continue sliding scale insulin    Pulmonary HTN - on Cialis, cont to  monitor - assess oxygen needs     HX: breast cancer - Stable at present     Essential hypertension, benign - Reasonable inpatient control   DVT prophylaxis:  Xarelto  Code Status:  full code  Family Communication: pt at bedside  Disposition Plan:  Likely in AM if electrolytes stable   Consultants:   Gastroenterologist   Procedures:   EGD 11/13   Antimicrobials:   Rocephin 05/28/2011 -->  Flagyl 05/28/2011 -->  Subjective: Pt reports feeling better.   Objective: Vitals:   05/29/17 0545 05/29/17 0805 05/29/17 0945 05/29/17 0950  BP: (!) 146/75 (!) 174/82 (!) 121/49 (!) 132/52  Pulse: 63 66 64 64  Resp: 20 20 (!) 24 19  Temp: 97.6 F (36.4 C) 98.2 F (36.8 C) 97.6 F (36.4 C)   TempSrc: Oral Oral Oral   SpO2: 96% 96% 93% 97%  Weight:      Height:        Intake/Output Summary (Last 24 hours) at 05/29/2017 0959 Last data filed at 05/29/2017 0947 Gross per 24 hour  Intake 2560 ml  Output -  Net 2560 ml   Filed Weights   05/27/17 1214 05/27/17 1646 05/28/17 0518  Weight: 80.3 kg (177 lb) 73.1 kg (161 lb 2.5 oz) 72.1 kg (158 lb 15.2 oz)    Physical Exam  Constitutional: Appears well-developed and well-nourished. No distress.  CVS: RRR, S1/S2 +, no murmurs, no gallops, no carotid bruit.  Pulmonary: Effort and breath sounds normal, no stridor, rhonchi, wheezes, rales.  Abdominal: Soft. BS +,  no distension, tenderness, rebound  or guarding.  Musculoskeletal: Normal range of motion. No edema and no tenderness.  Neuro: Alert. Normal reflexes, muscle tone coordination. No cranial nerve deficit. Skin: Skin is warm and dry. No rash noted. Not diaphoretic. No erythema. No pallor.  Psychiatric: Normal mood and affect. Behavior, judgment, thought content normal.   Data Reviewed: I have personally reviewed following labs and imaging studies  CBC: Recent Labs  Lab 05/27/17 1316 05/28/17 0229 05/29/17 0428  WBC 14.8* 19.5* 12.8*  NEUTROABS 12.7*  --   --   HGB  12.4 12.3 11.9*  HCT 37.6 37.7 37.6  MCV 95.7 95.7 98.4  PLT 282 307 950   Basic Metabolic Panel: Recent Labs  Lab 05/27/17 1316 05/27/17 1957 05/28/17 0229 05/28/17 0811 05/28/17 1750 05/29/17 0428  NA 133*  --  138 136  --  137  K 3.0*  --  2.7* 3.2* 4.1 4.2  CL 86*  --  93* 94*  --  102  CO2 32  --  32 32  --  28  GLUCOSE 199*  --  177* 179*  --  104*  BUN 13  --  11 11  --  12  CREATININE 0.67  --  0.85 0.84  --  0.64  CALCIUM 5.9*  --  6.2* 6.4*  --  6.3*  MG  --  0.2* 0.9*  --  3.0* 2.3  PHOS  --  5.6*  --   --   --  3.5   Liver Function Tests: Recent Labs  Lab 05/27/17 1316 05/28/17 0229 05/28/17 0811 05/29/17 0428  AST 37 55* 56* 40  ALT 13* _0 ALKPHOS 77 70 74 71  BILITOT 1.1 1.1 1.0 0.6  PROT 6.4* 5.9* 5.8* 5.8*  ALBUMIN 3.8 3.4* 3.2* 3.3*   CBG: Recent Labs  Lab 05/28/17 0737 05/28/17 1203 05/28/17 1722 05/28/17 2202 05/29/17 0747  GLUCAP 144* 176* 87 240* 138*   Thyroid Function Tests: Recent Labs    05/27/17 1957  TSH 2.394   Urine analysis:    Component Value Date/Time   COLORURINE YELLOW 05/27/2017 1410   APPEARANCEUR CLEAR 05/27/2017 1410   LABSPEC 1.020 05/27/2017 1410   PHURINE 7.0 05/27/2017 1410   GLUCOSEU NEGATIVE 05/27/2017 1410   HGBUR NEGATIVE 05/27/2017 1410   BILIRUBINUR NEGATIVE 05/27/2017 1410   KETONESUR >80 (A) 05/27/2017 1410   PROTEINUR 100 (A) 05/27/2017 1410   UROBILINOGEN 0.2 03/31/2013 2006   NITRITE NEGATIVE 05/27/2017 1410   LEUKOCYTESUR NEGATIVE 05/27/2017 1410    Radiology Studies: Ct Abdomen Pelvis W Contrast  Result Date: 05/27/2017 CLINICAL DATA:  Nausea vomiting for 4 days.  Mid abdominal pain. EXAM: CT ABDOMEN AND PELVIS WITH CONTRAST TECHNIQUE: Multidetector CT imaging of the abdomen and pelvis was performed using the standard protocol following bolus administration of intravenous contrast. CONTRAST:  123m ISOVUE-300 IOPAMIDOL (ISOVUE-300) INJECTION 61% COMPARISON:  None. FINDINGS: Lower  chest: No acute abnormality. Rim calcified mass within the left breast, query postsurgical. Hepatobiliary: No focal liver abnormality is seen. Status post cholecystectomy. No biliary dilatation. Pancreas: Unremarkable. No pancreatic ductal dilatation or surrounding inflammatory changes. Spleen: Normal in size without focal abnormality. Adrenals/Urinary Tract: Intermediate density 1.9 cm right adrenal nodule. Kidneys are normal, without renal calculi, focal lesion, or hydronephrosis. Bladder is unremarkable. Stomach/Bowel: Mild mucosal thickening of the distal gastric body, image 41/92, sequence 2. No evidence of small-bowel obstruction. Normal appendix. Extensive colonic diverticulosis, particularly of the sigmoid colon. Mucosal thickening of the sigmoid colon may represent an  element of diverticulitis. Vascular/Lymphatic: Aortic atherosclerosis. No enlarged abdominal or pelvic lymph nodes. Reproductive: Status post hysterectomy. No adnexal masses. Other: No abdominal wall hernia or abnormality. No abdominopelvic ascites. Musculoskeletal: Multilevel osteoarthritic changes of the spine. Left total hip arthroplasty. IMPRESSION: Indeterminate 1.9 cm right adrenal nodule. If further evaluation is clinically desired, abdominal CT or MRI, adrenal protocol should be followed. Circumferential mucosal thickening of the distal gastric body. This may represent inflammatory changes/ hypertrophy of the gastric mucosa. Infiltrating mass however is on the differential diagnosis. Extensive colonic diverticulosis with probable sigmoid diverticulitis. Please correlate to results of colonoscopy to exclude underlying mass. Electronically Signed   By: Fidela Salisbury M.D.   On: 05/27/2017 15:53   Dg Chest Port 1 View  Result Date: 05/27/2017 CLINICAL DATA:  Hypotension with dizziness EXAM: PORTABLE CHEST 1 VIEW COMPARISON:  CT chest March 31, 2013 FINDINGS: There is a calcified granuloma in the right lower lobe. There is no  edema or consolidation. Heart size and pulmonary vascularity are within normal limits. No adenopathy. There is aortic atherosclerosis. There is advanced arthropathy in both shoulders. IMPRESSION: No edema or consolidation.  Calcified granuloma right lower lobe. Heart size within normal limits.  There is aortic atherosclerosis. Advanced arthropathy in both shoulders. Aortic Atherosclerosis (ICD10-I70.0). Electronically Signed   By: Lowella Grip III M.D.   On: 05/27/2017 13:02    Scheduled Meds: . [MAR Hold] DULoxetine  60 mg Oral Daily  . [MAR Hold] febuxostat  40 mg Oral Daily  . [MAR Hold] insulin aspart  0-15 Units Subcutaneous TID WC  . [MAR Hold] insulin aspart  0-5 Units Subcutaneous QHS  . [MAR Hold] levothyroxine  100 mcg Oral QAC breakfast  . [MAR Hold] metoprolol  100 mg Oral QHS  . [MAR Hold] montelukast  10 mg Oral QHS  . [MAR Hold] pantoprazole  40 mg Oral Daily  . [MAR Hold] protein supplement shake  2 oz Oral BID BM  . [MAR Hold] spironolactone  50 mg Oral Daily   Continuous Infusions: . sodium chloride 75 mL/hr at 05/29/17 0609  . [MAR Hold] cefTRIAXone (ROCEPHIN)  IV Stopped (05/28/17 1309)  . [MAR Hold] metronidazole Stopped (05/29/17 0312)  . [MAR Hold] sodium chloride       LOS: 2 days   Time spent: 25 minutes   Faye Ramsay, MD [facility] Pager [phone]  If 7PM-7AM, please contact night-coverage [email or web address] Password [email or web address] 05/29/2017, 9:59 AM

## 2017-05-29 NOTE — Op Note (Signed)
[facility] Patient Name: Amanda Short Procedure Date: 05/29/2017 MRN: [id number] Attending MD: Docia Chuck. Henrene Pastor , MD Date of Birth: June 10, 1941 CSN: [id number] Age: 76 Admit Type: Inpatient Procedure:                Upper GI endoscopy Indications:              Abdominal pain, Abnormal CT of the GI tract, Nausea                            with vomiting Providers:                Docia Chuck. Henrene Pastor, MD, Burtis Junes, RN, Janie Billups,                            Technician, Courtney Heys. [place], CRNA Referring MD:             [facility] Medicines:                Monitored Anesthesia Care Complications:            No immediate complications. Estimated Blood Loss:     Estimated blood loss: none. Procedure:                Pre-Anesthesia Assessment:                           - Prior to the procedure, a History and Physical                            was performed, and patient medications and                            allergies were reviewed. The patient's tolerance of                            previous anesthesia was also reviewed. The risks                            and benefits of the procedure and the sedation                            options and risks were discussed with the patient.                            All questions were answered, and informed consent                            was obtained. Prior Anticoagulants: The patient has                            taken Xarelto (rivaroxaban), last dose was day of                            procedure. ASA Grade Assessment: III - A patient  with severe systemic disease. After reviewing the                            risks and benefits, the patient was deemed in                            satisfactory condition to undergo the procedure.                           After obtaining informed consent, the endoscope was                            passed under direct vision. Throughout the                 procedure, the patient's blood pressure, pulse, and                            oxygen saturations were monitored continuously. The                            EG-[id number] ([id number]) scope was introduced through the                            mouth, and advanced to the second part of duodenum.                            The upper GI endoscopy was accomplished without                            difficulty. The patient tolerated the procedure                            well. Scope In: Scope Out: Findings:      The esophagus was normal.      The stomach revealed several small (less than 1 cm) benign-appearing       incidental polyps in the mid stomach. There was mild antral erythema.       Stomach was otherwise normal.      The examined duodenum was normal.      The cardia and gastric fundus were normal on retroflexion. Impression:               - Normal esophagus.                           - Normal stomach, save a few small incidental                            polyps.                           - Normal examined duodenum.                           - No specimens collected. Moderate Sedation:      none Recommendation:           -  Patient has a contact number available for                            emergencies. The signs and symptoms of potential                            delayed complications were discussed with the                            patient. Return to normal activities tomorrow.                            Written discharge instructions were provided to the                            patient.                           - Advance diet.                           - Continue present medications.                           - A total of 7 days of antibiotics for                            diverticulitis                           No additional GI recommendations. Resume outpatient                            GI care with primary GI doctor as needed. Discussed                             with patient and husband. Copy of this report                            provided. Will sign off. Procedure Code(s):        --- Professional ---                           [phone], Esophagogastroduodenoscopy, flexible,                            transoral; diagnostic, including collection of                            specimen(s) by brushing or washing, when performed                            (separate procedure) Diagnosis Code(s):        --- Professional ---  R10.9, Unspecified abdominal pain                           R11.2, Nausea with vomiting, unspecified                           R93.3, Abnormal findings on diagnostic imaging of                            other parts of digestive tract CPT copyright 2016 [employer]. All rights reserved. The codes documented in this report are preliminary and upon coder review may  be revised to meet current compliance requirements. Docia Chuck. Henrene Pastor, MD 05/29/2017 9:50:49 AM This report has been signed electronically. Number of Addenda: 0

## 2017-05-29 NOTE — Progress Notes (Signed)
CRITICAL VALUE ALERT  Critical Value:  Ca 6.3  Date & Time Notied:  05/29/17 0520  Provider Notified: Hal Hope  Orders Received/Actions taken: 1 Gr calcium gluconate ordered IV

## 2017-05-30 DIAGNOSIS — K5732 Diverticulitis of large intestine without perforation or abscess without bleeding: Secondary | ICD-10-CM

## 2017-05-30 DIAGNOSIS — E1122 Type 2 diabetes mellitus with diabetic chronic kidney disease: Secondary | ICD-10-CM

## 2017-05-30 DIAGNOSIS — I10 Essential (primary) hypertension: Secondary | ICD-10-CM

## 2017-05-30 DIAGNOSIS — G43A0 Cyclical vomiting, not intractable: Secondary | ICD-10-CM

## 2017-05-30 DIAGNOSIS — R9431 Abnormal electrocardiogram [ECG] [EKG]: Secondary | ICD-10-CM

## 2017-05-30 DIAGNOSIS — N182 Chronic kidney disease, stage 2 (mild): Secondary | ICD-10-CM

## 2017-05-30 LAB — BASIC METABOLIC PANEL
ANION GAP: 6 (ref 5–15)
BUN: 8 mg/dL (ref 6–20)
CALCIUM: 6.6 mg/dL — AB (ref 8.9–10.3)
CO2: 26 mmol/L (ref 22–32)
Chloride: 103 mmol/L (ref 101–111)
Creatinine, Ser: 0.54 mg/dL (ref 0.44–1.00)
GLUCOSE: 142 mg/dL — AB (ref 65–99)
POTASSIUM: 4.1 mmol/L (ref 3.5–5.1)
Sodium: 135 mmol/L (ref 135–145)

## 2017-05-30 LAB — CBC
HEMATOCRIT: 36 % (ref 36.0–46.0)
Hemoglobin: 11.5 g/dL — ABNORMAL LOW (ref 12.0–15.0)
MCH: 31.6 pg (ref 26.0–34.0)
MCHC: 31.9 g/dL (ref 30.0–36.0)
MCV: 98.9 fL (ref 78.0–100.0)
Platelets: 249 10*3/uL (ref 150–400)
RBC: 3.64 MIL/uL — AB (ref 3.87–5.11)
RDW: 14.2 % (ref 11.5–15.5)
WBC: 12 10*3/uL — AB (ref 4.0–10.5)

## 2017-05-30 LAB — GLUCOSE, CAPILLARY
GLUCOSE-CAPILLARY: 133 mg/dL — AB (ref 65–99)
GLUCOSE-CAPILLARY: 243 mg/dL — AB (ref 65–99)

## 2017-05-30 LAB — MAGNESIUM: Magnesium: 1.6 mg/dL — ABNORMAL LOW (ref 1.7–2.4)

## 2017-05-30 MED ORDER — SODIUM CHLORIDE 0.9 % IV SOLN
2.0000 g | Freq: Once | INTRAVENOUS | Status: AC
  Administered 2017-05-30: 2 g via INTRAVENOUS
  Filled 2017-05-30: qty 20

## 2017-05-30 MED ORDER — RIVAROXABAN 15 MG PO TABS
15.0000 mg | ORAL_TABLET | Freq: Every day | ORAL | Status: DC
  Administered 2017-05-30: 15 mg via ORAL
  Filled 2017-05-30: qty 1

## 2017-05-30 MED ORDER — CIPROFLOXACIN HCL 500 MG PO TABS: 500.0000 mg | ORAL_TABLET | Freq: Two times a day (BID) | ORAL | Status: DC

## 2017-05-30 MED ORDER — CALCIUM CARBONATE-VITAMIN D 500-200 MG-UNIT PO TABS: 1.0000 | ORAL_TABLET | Freq: Two times a day (BID) | ORAL | 0 refills | Status: DC

## 2017-05-30 MED ORDER — METRONIDAZOLE 500 MG PO TABS: 500.0000 mg | ORAL_TABLET | Freq: Three times a day (TID) | ORAL | Status: DC

## 2017-05-30 MED ORDER — AMOXICILLIN-POT CLAVULANATE 875-125 MG PO TABS
1.0000 | ORAL_TABLET | Freq: Two times a day (BID) | ORAL | Status: DC
  Administered 2017-05-30: 1 via ORAL
  Filled 2017-05-30: qty 1

## 2017-05-30 MED ORDER — AMOXICILLIN-POT CLAVULANATE 875-125 MG PO TABS: 1.0000 | ORAL_TABLET | Freq: Two times a day (BID) | ORAL | 0 refills | Status: DC

## 2017-05-30 MED ORDER — MAGNESIUM SULFATE 2 GM/50ML IV SOLN
2.0000 g | Freq: Once | INTRAVENOUS | Status: AC
  Administered 2017-05-30: 2 g via INTRAVENOUS
  Filled 2017-05-30: qty 50

## 2017-05-30 MED ORDER — FUROSEMIDE 40 MG PO TABS: 40.0000 mg | ORAL_TABLET | Freq: Every day | ORAL | Status: DC

## 2017-05-30 NOTE — Progress Notes (Signed)
Patient is stable for discharge. Discharge instructions and medications have been reviewed with the patient and her husband, and all questions answered. AVS and prescriptions given to patient.  Amanda Short [facility]

## 2017-05-30 NOTE — Care Management Note (Signed)
Case Management Note  Patient Details  Name: Amanda Short MRN: [id number] Date of Birth: 11-26-1940  Subjective/Objective:    76 yo admitted with hypocalcemia                Action/Plan: From home with spouse. PT recommendations for HHPT gone over with pt. Choice offered for home health services and [facility] chosen. [facility] rep alerted of need for HHPT. Pt to potentially need home 02 as well. Will need qualifying desaturation screen and home 02 order prior to DC.  Expected Discharge Date:                  Expected Discharge Plan:  [place]  In-House Referral:     Discharge planning Services  CM Consult  Post Acute Care Choice:  Home Health Choice offered to:  Patient  DME Arranged:    DME Agency:     HH Arranged:  PT [place]:  [place]  Status of Service:  In process, will continue to follow  If discussed at Long Length of Stay Meetings, dates discussed:    Additional CommentsLynnell Catalan, RN 05/30/2017, 10:20 AM  [phone]

## 2017-05-30 NOTE — Progress Notes (Signed)
CSW contacted by patient's RN and informed that patient has decided to discharge home. CSW signing off, no other needs identified.   Abundio Miu, [place] Social Worker [facility] Cell#: [phone]

## 2017-05-30 NOTE — Discharge Summary (Signed)
Physician Discharge Summary  Amanda Short WHQ:[id number] DOB: October 26, 1940 DOA: 05/27/2017  PCP: System, Pcp Not In  Admit date: 05/27/2017 Discharge date: 05/30/2017  Time spent: 35 minutes  Recommendations for Outpatient Follow-up:  1. Repeat BMET to follow electrolytes and renal function  2. Repeat Mg to follow electrolytes  3. Reassess volume status and BP; resume home diuretics if stable.  Discharge Diagnoses:  Active Problems:   HX: breast cancer   Essential hypertension, benign   Diabetes mellitus type 2, controlled (HCC)   Hypocalcemia   Prolonged QT interval   Colitis   Abdominal pain   Non-intractable cyclical vomiting with nausea   Abnormal CT of the abdomen   Diverticulitis of large intestine without perforation or abscess without bleeding   Discharge Condition: stable and improved. Patient discharge home with [facility] services.  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 Blood work was also notable for severe electrolyte disturbance including calcium of 5.9, K 3, magnesium <1, QT prolongation noted on EKG.  On further questioning, patient reported nausea and poor oral intake, nonbloody diarrhea and abdominal cramping shortly after she was started on Cialis for pulmonary hypertension about a month  ago.  Hospital Course:  Sigmoid diverticulitis -given elevated QTC on presentation; will discharge on Augmentin and treat for 5 more days to complete antibiotic therapy. -EGD unremarkable except for come incidental polyps and mild gastritis. - diet advance and tolerated. Patient discharge on oral antibiotics and diet instructions. Will follow up with PCP and with her gastroenterologist as needed after discharge.  -low residue diet recommended     Hypocalcemia  - Unclear etiology, From diarrhea in the setting of diverticulitis and continue use of diuretics -repleted and patient discharge on oral supplementation -will recommend electrolyte follow up at discharge -no muscle cramps or any complaints.    Prolongation of QTc - in the setting of electrolyte depletion including calcium, potassium, magnesium - 12 leak EKG this AM with corrected QTc of 465    Hypomagnesemia, hypokalemia - Possibly in the setting of ongoing GI loses - supplemented and essentially WNL at discharge    Diabetes type 2 -Patient on metformin at home, will resume -advise to follow low carb diet     Pulmonary HTN -on Cialis, cont to monitor -found to require oxygen supplementation  -outpatient follow up with PCP and pulmonary service     OSA -will continue CPAP at bedtime     Hypoxia: acute on chronic  -from OSA and pulmonary HTN. -continue CPAP -started on oxygen supplementation 2.5L  -outpatient follow up with PCP     HX: breast cancer - Stable at present     Essential hypertension, benign -resume home antihypertensive regimen, except for lasix (which will remained on hold until follow up with PCP).    Physical deconditioning -HH services arranged -patient declined SNF.  Procedures: EGD: normal  stomach and esophagus; small incidental polyps, no bleeding or obstruction.  Consultations:  GI  Discharge Exam: Vitals:   05/30/17 0513 05/30/17 1320  BP: (!) 152/86 (!) 179/88  Pulse: 63  66  Resp: 18   Temp: 97.9 F (36.6 C) 98.6 F (37 C)  SpO2: 93% 97%    General: afebrile, no CP, no nausea, no vomiting and with good O2 sat on 2.5L [place] supplementation. Cardiovascular: S1 and S2, no rubs, no gallops, no JVD Respiratory: soft, NT, positive BS, no guarding  Extremities: trace edema bilaterally Neurology: AAOX3, no focal neurologic or motor deficit   Discharge Instructions   Discharge Instructions    Diet - low sodium heart healthy   Complete by:  As directed    Discharge instructions   Complete by:  As directed    Please keep yourself well hydrated  Take medications as prescribed  Please arrange follow up with PCP in 1 week Hold lasix until follow up with PCP. Watch amount of sodium intake and follow low carbohydrates and low residue diet.     Current Discharge Medication List    START taking these medications   Details  amoxicillin-clavulanate (AUGMENTIN) 875-125 MG tablet Take 1 tablet every 12 (twelve) hours by mouth. Qty: 10 tablet, Refills: 0    calcium-vitamin D (OSCAL 500/200 D-3) 500-200 MG-UNIT tablet Take 1 tablet 2 (two) times daily by mouth. Qty: 20 tablet, Refills: 0      CONTINUE these medications which have CHANGED   Details  furosemide (LASIX) 40 MG tablet Take 1 tablet (40 mg total) daily by mouth. Hold until follow up with PCP      CONTINUE these medications which have NOT CHANGED   Details  DULoxetine (CYMBALTA) 60 MG capsule Take 60 mg by mouth daily.    febuxostat (ULORIC) 40 MG tablet Take 40 mg daily by mouth.    levothyroxine (SYNTHROID, LEVOTHROID) 100 MCG tablet Take 100 mcg by mouth daily before breakfast.    metFORMIN (GLUCOPHAGE) 500 MG tablet Take 1,000 mg daily by mouth. Take 1000 mg in the morning and 500 mg in the evening     metoprolol (TOPROL-XL) 200 MG 24 hr tablet Take 100 mg by mouth at bedtime.     montelukast (SINGULAIR) 10 MG tablet Take 10 mg at bedtime by mouth.    Multiple Vitamin (MULTIVITAMIN WITH  MINERALS) TABS tablet Take 1 tablet daily by mouth.    omeprazole (PRILOSEC) 40 MG capsule Take 40 mg by mouth 2 (two) times daily.    Rivaroxaban (XARELTO PO) Take 15 mg by mouth at bedtime.     spironolactone (ALDACTONE) 25 MG tablet Take 50 mg daily by mouth.      STOP taking these medications     traMADol (ULTRAM) 50 MG tablet      atorvastatin (LIPITOR) 20 MG tablet      cephALEXin (KEFLEX) 500 MG capsule        Allergies  Allergen Reactions  . Allopurinol Nausea And Vomiting    The results of significant diagnostics from this hospitalization (including imaging, microbiology, ancillary and laboratory) are listed below for reference.    Significant Diagnostic Studies: Ct Abdomen Pelvis W Contrast  Result Date: 05/27/2017 CLINICAL DATA:  Nausea vomiting for 4 days.  Mid abdominal pain. EXAM: CT ABDOMEN AND PELVIS WITH CONTRAST TECHNIQUE: Multidetector CT imaging of the abdomen and pelvis was performed using the standard protocol following bolus administration of intravenous contrast. CONTRAST:  165m ISOVUE-300 IOPAMIDOL (ISOVUE-300) INJECTION 61%  COMPARISON:  None. FINDINGS: Lower chest: No acute abnormality. Rim calcified mass within the left breast, query postsurgical. Hepatobiliary: No focal liver abnormality is seen. Status post cholecystectomy. No biliary dilatation. Pancreas: Unremarkable. No pancreatic ductal dilatation or surrounding inflammatory changes. Spleen: Normal in size without focal abnormality. Adrenals/Urinary Tract: Intermediate density 1.9 cm right adrenal nodule. Kidneys are normal, without renal calculi, focal lesion, or hydronephrosis. Bladder is unremarkable. Stomach/Bowel: Mild mucosal thickening of the distal gastric body, image 41/92, sequence 2. No evidence of small-bowel obstruction. Normal appendix. Extensive colonic diverticulosis, particularly of the sigmoid colon. Mucosal thickening of the sigmoid colon may represent an element of diverticulitis.  Vascular/Lymphatic: Aortic atherosclerosis. No enlarged abdominal or pelvic lymph nodes. Reproductive: Status post hysterectomy. No adnexal masses. Other: No abdominal wall hernia or abnormality. No abdominopelvic ascites. Musculoskeletal: Multilevel osteoarthritic changes of the spine. Left total hip arthroplasty. IMPRESSION: Indeterminate 1.9 cm right adrenal nodule. If further evaluation is clinically desired, abdominal CT or MRI, adrenal protocol should be followed. Circumferential mucosal thickening of the distal gastric body. This may represent inflammatory changes/ hypertrophy of the gastric mucosa. Infiltrating mass however is on the differential diagnosis. Extensive colonic diverticulosis with probable sigmoid diverticulitis. Please correlate to results of colonoscopy to exclude underlying mass. Electronically Signed   By: Fidela Salisbury M.D.   On: 05/27/2017 15:53   Dg Chest Port 1 View  Result Date: 05/27/2017 CLINICAL DATA:  Hypotension with dizziness EXAM: PORTABLE CHEST 1 VIEW COMPARISON:  CT chest March 31, 2013 FINDINGS: There is a calcified granuloma in the right lower lobe. There is no edema or consolidation. Heart size and pulmonary vascularity are within normal limits. No adenopathy. There is aortic atherosclerosis. There is advanced arthropathy in both shoulders. IMPRESSION: No edema or consolidation.  Calcified granuloma right lower lobe. Heart size within normal limits.  There is aortic atherosclerosis. Advanced arthropathy in both shoulders. Aortic Atherosclerosis (ICD10-I70.0). Electronically Signed   By: Lowella Grip III M.D.   On: 05/27/2017 13:02   Microbiology: No results found for this or any previous visit (from the past 240 hour(s)).   Labs: Basic Metabolic Panel: Recent Labs  Lab 05/27/17 1316 05/27/17 1957 05/28/17 0229 05/28/17 0811 05/28/17 1750 05/29/17 0428 05/30/17 0418  NA 133*  --  138 136  --  137 135  K 3.0*  --  2.7* 3.2* 4.1 4.2 4.1   CL 86*  --  93* 94*  --  102 103  CO2 32  --  32 32  --  28 26  GLUCOSE 199*  --  177* 179*  --  104* 142*  BUN 13  --  11 11  --  12 8  CREATININE 0.67  --  0.85 0.84  --  0.64 0.54  CALCIUM 5.9*  --  6.2* 6.4*  --  6.3* 6.6*  MG  --  0.2* 0.9*  --  3.0* 2.3 1.6*  PHOS  --  5.6*  --   --   --  3.5  --    Liver Function Tests: Recent Labs  Lab 05/27/17 1316 05/28/17 0229 05/28/17 0811 05/29/17 0428  AST 37 55* 56* 40  ALT 13* _0 ALKPHOS 77 70 74 71  BILITOT 1.1 1.1 1.0 0.6  PROT 6.4* 5.9* 5.8* 5.8*  ALBUMIN 3.8 3.4* 3.2* 3.3*   CBC: Recent Labs  Lab 05/27/17 1316 05/28/17 0229 05/29/17 0428 05/30/17 0418  WBC 14.8* 19.5* 12.8* 12.0*  NEUTROABS 12.7*  --   --   --  HGB 12.4 12.3 11.9* 11.5*  HCT 37.6 37.7 37.6 36.0  MCV 95.7 95.7 98.4 98.9  PLT 282 307 300 249    CBG: Recent Labs  Lab 05/29/17 1144 05/29/17 1649 05/29/17 2155 05/30/17 0737 05/30/17 1108  GLUCAP 146* 210* 124* 133* 243*    Signed:  Barton Dubois MD.  [facility] 05/30/2017, 1:48 PM

## 2017-05-30 NOTE — Clinical Social Work Note (Signed)
Clinical Social Work Assessment  Patient Details  Name: Amanda Short MRN: [id number] Date of Birth: Oct 15, 1940  Date of referral:  05/30/17               Reason for consult:  Facility Placement                Permission sought to share information with:  Chartered certified accountant granted to share information::  Yes, Verbal Permission Granted  Name::        Agency::     Relationship::     Contact Information:     Housing/Transportation Living arrangements for the past 2 months:  Single Family Home Source of Information:  Patient Patient Interpreter Needed:  None Criminal Activity/Legal Involvement Pertinent to Current Situation/Hospitalization:  No - Comment as needed Significant Relationships:  Spouse Lives with:  Spouse Do you feel safe going back to the place where you live?  Yes Need for family participation in patient care:  No (Coment)  Care giving concerns:  CSW notified by patient's RN that patient is interested in SNF for ST rehab. PT recommending SNF. Patient from home with spouse, patient reported that prior to hospitalization at baseline she was independent with ADLs and ambulation.    Social Worker assessment / plan:  CSW spoke with patient at bedside regarding PT recommendation for SNF. CSW explained SNF placement and inquired about patient's interest in SNF. Patient became tearful and reported that she is agreeable. CSW emphasized that patient's stay at SNF would be for a short period of time, patient verbalized understanding and reported that she would just prefer to be at home. CSW validated patient's feelings and provided patient with local SNF listing.   CSW will complete FL2 and provide bed offers.  Employment status:  Retired Nurse, adult PT Recommendations:  [place] / Referral to community resources:  [place]  Patient/Family's Response to care:  Patient  appreciative of CSW assistance with discharge planning.   Patient/Family's Understanding of and Emotional Response to Diagnosis, Current Treatment, and Prognosis: Patient verbalized strong understanding of diagnosis, current treatment and prognosis. Patient presented tearful when speaking about SNF for ST rehab, CSW provided emotional support and provided psycho-education about ST rehab.   Emotional Assessment Appearance:  Appears stated age Attitude/Demeanor/Rapport:  Other(Cooperative) Affect (typically observed):  Tearful/Crying Orientation:  Oriented to Self, Oriented to Place, Oriented to  Time, Oriented to Situation Alcohol / Substance use:  Not Applicable Psych involvement (Current and /or in the community):  No (Comment)  Discharge Needs  Concerns to be addressed:  Care Coordination Readmission within the last 30 days:  No Current discharge risk:  Physical Impairment Barriers to Discharge:  No Barriers Identified   Burnis Medin, LCSW 05/30/2017, 2:27 PM

## 2017-05-30 NOTE — NC FL2 (Signed)
[place] MEDICAID FL2 LEVEL OF CARE SCREENING TOOL     IDENTIFICATION  Patient Name: Amanda Short Birthdate: 05-18-1941 Sex: female Admission Date (Current Location): 05/27/2017  [facility] [place] Number:  Herbalist and Address:  [facility],  [address] [place], [place]      Provider Number: [id number]  Attending Physician Name and Address:  Barton Dubois, MD  Relative Name and Phone Number:       Current Level of Care: Hospital Recommended Level of Care: [place] Prior Approval Number:    Date Approved/Denied:   PASRR Number:    Discharge Plan: SNF    Current Diagnoses: Patient Active Problem List   Diagnosis Date Noted  . Abdominal pain   . Non-intractable cyclical vomiting with nausea   . Abnormal CT of the abdomen   . Hypocalcemia 05/27/2017  . 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 Colitis 05/27/2017  . 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 Diabetes mellitus type 2, controlled ([place]) 07/21/2015  . Cellulitis of foot, right 07/21/2015  . Cellulitis of right foot 07/21/2015  . Acute pulmonary embolism ([place]) 04/01/2013  . DVT (deep venous thrombosis) ([place]) 04/01/2013  . HX: breast cancer 04/01/2013  . Concussion 04/01/2013  . Diabetes ([place]) 04/01/2013  . Essential hypertension, benign 04/01/2013  . Unspecified hypothyroidism 04/01/2013  . OSA on CPAP 04/01/2013    Orientation RESPIRATION BLADDER Height & Weight     Self, Situation, Place, Time  O2 Continent Weight: 158 lb 15.2 oz (72.1 kg) Height:  _0  (154.9 cm)  BEHAVIORAL SYMPTOMS/MOOD NEUROLOGICAL BOWEL NUTRITION STATUS        Diet(regular)  AMBULATORY STATUS COMMUNICATION OF NEEDS Skin   Extensive Assist Verbally Normal                       Personal Care Assistance Level of Assistance  Bathing, Feeding, Dressing Bathing Assistance: Limited assistance Feeding assistance: Independent Dressing Assistance: Limited assistance      Functional Limitations Info  Sight, Hearing, Speech Sight Info: Adequate Hearing Info: Adequate Speech Info: Adequate    SPECIAL CARE FACTORS FREQUENCY  PT (By licensed PT), OT (By licensed OT)     PT Frequency: 5x OT Frequency: 5x            Contractures Contractures Info: Not present    Additional Factors Info  Code Status, Allergies, Insulin Sliding Scale Code Status Info: Full code Allergies Info: Allopurinol   Insulin Sliding Scale Info: 0-15 Units TID with meals;;;;    0-5 Units HS coverage       Current Medications (05/30/2017):  This is the current hospital active medication list Current Facility-Administered Medications  Medication Dose Route Frequency Provider Last Rate Last Dose  . acetaminophen (TYLENOL) tablet 650 mg  650 mg Oral Q6H PRN Donne Hazel, MD       Or  . acetaminophen (TYLENOL) suppository 650 mg  650 mg Rectal Q6H PRN Donne Hazel, MD      . amoxicillin-clavulanate (AUGMENTIN) 875-125 MG per tablet 1 tablet  1 tablet Oral Q12H Barton Dubois, MD      . calcium gluconate 2 g in sodium chloride 0.9 % 100 mL IVPB  2 g Intravenous Once Barton Dubois, MD 120 mL/hr at 05/30/17 1130 2 g at 05/30/17 1130  . DULoxetine (CYMBALTA) DR capsule 60 mg  60 mg Oral Daily Donne Hazel, MD   60 mg at 05/30/17 0933  . febuxostat (  ULORIC) tablet 40 mg  40 mg Oral Daily Donne Hazel, MD   40 mg at 05/30/17 0932  . insulin aspart (novoLOG) injection 0-15 Units  0-15 Units Subcutaneous TID WC Donne Hazel, MD   2 Units at 05/30/17 [phone]  . insulin aspart (novoLOG) injection 0-5 Units  0-5 Units Subcutaneous QHS Donne Hazel, MD   2 Units at 05/29/17 0018  . ketorolac (TORADOL) 15 MG/ML injection 15 mg  15 mg Intravenous Q6H PRN Donne Hazel, MD      . levothyroxine (SYNTHROID, LEVOTHROID) tablet 100 mcg  100 mcg Oral QAC breakfast Donne Hazel, MD   100 mcg at 05/30/17 0843  . magnesium sulfate IVPB 2 g 50 mL  2 g Intravenous Once Barton Dubois, MD      . metoprolol succinate (TOPROL-XL) 24 hr tablet 100 mg  100 mg Oral QHS Donne Hazel, MD   100 mg at 05/29/17 2043  . montelukast (SINGULAIR) tablet 10 mg  10 mg Oral QHS Donne Hazel, MD   10 mg at 05/29/17 2042  . pantoprazole (PROTONIX) EC tablet 40 mg  40 mg Oral Daily Donne Hazel, MD   40 mg at 05/30/17 0933  . protein supplement (PREMIER PROTEIN) liquid  2 oz Oral BID BM Theodis Blaze, MD   2 oz at 05/30/17 0934  . Rivaroxaban (XARELTO) tablet 15 mg  15 mg Oral Q supper Barton Dubois, MD      . sodium chloride 0.9 % bolus 250 mL  250 mL Intravenous Once Donne Hazel, MD      . spironolactone (ALDACTONE) tablet 50 mg  50 mg Oral Daily Donne Hazel, MD   50 mg at 05/30/17 8984     Discharge Medications: Please see discharge summary for a list of discharge medications.  Relevant Imaging Results:  Relevant Lab Results:   Additional Information SSN [id number]  Burnis Medin, LCSW

## 2017-05-30 NOTE — Progress Notes (Signed)
SATURATION QUALIFICATIONS: (This note is used to comply with regulatory documentation for home oxygen)  Patient Saturations on Room Air at Rest = 87%  Patient Saturations on Room Air while Ambulating = 78%  Patient Saturations on 4 Liters of oxygen while Ambulating = 93%  Please briefly explain why patient needs home oxygen: to maintain appropriate SaO2 levels.   Blondell Reveal Kistler PT 05/30/2017  [phone]

## 2017-05-30 NOTE — Progress Notes (Signed)
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 She had 6 episodes of loss of balance while walking 200' with RW, pt denied dizziness/vertigo. She reports mild unsteadiness at baseline 2* concussion years ago, but is now much worse than baseline. SaO2 78% on room air walking, 93% on 4L O2 [place] walking.  PT recommending hands on assist for walking, pt is concerned about her husband being able to provide this level of care, she is agreeable to ST-SNF.     Follow Up Recommendations  SNF;Supervision for mobility/OOB     Equipment Recommendations  None recommended by PT    Recommendations for Other Services       Precautions / Restrictions Precautions Precautions: Fall Precaution Comments: monitor sats, very unsteady walking Restrictions Weight Bearing Restrictions: No    Mobility  Bed Mobility Overal bed mobility: Modified Independent Bed Mobility: Supine to Sit     Supine to sit: HOB elevated        Transfers Overall transfer level: Needs assistance Equipment used: Rolling walker (2 wheeled) Transfers: Sit to/from Stand Sit to Stand: Min assist         General transfer comment: assist to rise and steady, verbal cues for hand placement, pt attempted to sit prior to reaching recliner despite VCs to step all the way back to recliner, uncontrolled descent to chair  (she was fatigued from walking and eager to sit)  Ambulation/Gait Ambulation/Gait assistance: Mod assist Ambulation Distance (Feet): 200  Feet Assistive device: Rolling walker (2 wheeled) Gait Pattern/deviations: Step-through pattern;Decreased stride length;Staggering left;Staggering right   Gait velocity interpretation: at or above normal speed for age/gender General Gait Details: 6 epidoses of loss of balance while walking, required min to mod A to regain balance, pt denied dizziness/vertigo today, pt reports having a concussion and vertigo years ago with some resulting unsteadiness but this is much worse than baseline, SpO2 78% on room air walking, 93% on 4L O2 [place], VCs for pursed lip breathing   Stairs            Wheelchair Mobility    Modified Rankin (Stroke Patients Only)       Balance Overall balance assessment: Needs assistance;History of Falls Sitting-balance support: Feet supported;No upper extremity supported Sitting balance-Leahy Scale: Good     Standing balance support: Bilateral upper extremity supported Standing balance-Leahy Scale: Poor Standing balance comment: requires UE support                            Cognition Arousal/Alertness: Awake/alert Behavior During Therapy: WFL for tasks assessed/performed Overall Cognitive Status: Within Functional Limits for tasks assessed                                        Exercises      General Comments        Pertinent Vitals/Pain Pain Assessment: No/denies pain    Home Living  Prior Function            PT Goals (current goals can now be found in the care plan section) Acute Rehab PT Goals PT Goal Formulation: With patient Time For Goal Achievement: 06/11/17 Potential to Achieve Goals: Good Progress towards PT goals: Progressing toward goals    Frequency    Min 3X/week      PT Plan Discharge plan needs to be updated    Co-evaluation              AM-PAC PT "6 Clicks" Daily Activity  Outcome Measure  Difficulty turning over in bed (including adjusting  bedclothes, sheets and blankets)?: A Little Difficulty moving from lying on back to sitting on the side of the bed? : A Little Difficulty sitting down on and standing up from a chair with arms (e.g., wheelchair, bedside commode, etc,.)?: A Lot Help needed moving to and from a bed to chair (including a wheelchair)?: A Lot Help needed walking in hospital room?: A Lot Help needed climbing 3-5 steps with a railing? : A Lot 6 Click Score: 14    End of Session Equipment Utilized During Treatment: Gait belt;Oxygen Activity Tolerance: Patient tolerated treatment well Patient left: in chair;with call bell/phone within reach;with chair alarm set Nurse Communication: Mobility status PT Visit Diagnosis: Other abnormalities of gait and mobility (R26.89);Unsteadiness on feet (R26.81);History of falling (Z91.81);Difficulty in walking, not elsewhere classified (R26.2)     Time: 1107-1140 PT Time Calculation (min) (ACUTE ONLY): 33 min  Charges:  $Gait Training: 8-22 mins $Therapeutic Activity: 8-22 mins                    G Codes:          Philomena Doheny 05/30/2017, 11:51 AM [phone]

## 2017-05-30 NOTE — Progress Notes (Signed)
SATURATION QUALIFICATIONS: (This note is used to comply with regulatory documentation for home oxygen)  Patient Saturations on Room Air at Rest = 74%  Patient Saturations on Room Air while Ambulating = n/a  Patient Saturations on 2.5Liters of oxygen at rest= 92%  Patient Saturations on Liters of oxygen while Ambulating =   Please briefly explain why patient needs home oxygen: desaturation at rest on Room air.

## 2017-05-30 NOTE — Progress Notes (Signed)
OT Note:  Pt was getting ready to d/c.  She stood with RN and husband while I was present. She is very unsteady and would benefit from [facility]. RN states that this has been ordered.  3:1 was also ordered.  Husband states that pt has a tub/shower with a seat. Recommended that she sponge bathe until [facility] assesses her to make sure that transfer will be safe.  [place], OTR/L [phone] 05/30/2017

## 2017-05-31 ENCOUNTER — Encounter (HOSPITAL_COMMUNITY): Payer: Self-pay | Admitting: Internal Medicine

## 2017-06-06 ENCOUNTER — Inpatient Hospital Stay (HOSPITAL_COMMUNITY)
Admission: EM | Admit: 2017-06-06 | Discharge: 2017-06-23 | DRG: 004 | Disposition: A | Discharge status: Rehabilitation Facility | Payer: Medicare Other | Attending: Family Medicine | Admitting: Family Medicine

## 2017-06-06 ENCOUNTER — Emergency Department (HOSPITAL_COMMUNITY): Payer: Medicare Other

## 2017-06-06 ENCOUNTER — Encounter (HOSPITAL_COMMUNITY): Payer: Self-pay | Admitting: *Deleted

## 2017-06-06 ENCOUNTER — Inpatient Hospital Stay (HOSPITAL_COMMUNITY): Payer: Medicare Other

## 2017-06-06 DIAGNOSIS — R5381 Other malaise: Secondary | ICD-10-CM | POA: Diagnosis not present

## 2017-06-06 DIAGNOSIS — E785 Hyperlipidemia, unspecified: Secondary | ICD-10-CM | POA: Diagnosis present

## 2017-06-06 DIAGNOSIS — E872 Acidosis: Secondary | ICD-10-CM | POA: Diagnosis present

## 2017-06-06 DIAGNOSIS — I959 Hypotension, unspecified: Secondary | ICD-10-CM | POA: Diagnosis not present

## 2017-06-06 DIAGNOSIS — Z833 Family history of diabetes mellitus: Secondary | ICD-10-CM | POA: Diagnosis not present

## 2017-06-06 DIAGNOSIS — E46 Unspecified protein-calorie malnutrition: Secondary | ICD-10-CM | POA: Diagnosis present

## 2017-06-06 DIAGNOSIS — Z8709 Personal history of other diseases of the respiratory system: Secondary | ICD-10-CM | POA: Diagnosis not present

## 2017-06-06 DIAGNOSIS — Z96642 Presence of left artificial hip joint: Secondary | ICD-10-CM | POA: Diagnosis present

## 2017-06-06 DIAGNOSIS — G9340 Encephalopathy, unspecified: Secondary | ICD-10-CM | POA: Diagnosis present

## 2017-06-06 DIAGNOSIS — R0682 Tachypnea, not elsewhere classified: Secondary | ICD-10-CM | POA: Diagnosis not present

## 2017-06-06 DIAGNOSIS — D649 Anemia, unspecified: Secondary | ICD-10-CM | POA: Diagnosis not present

## 2017-06-06 DIAGNOSIS — F329 Major depressive disorder, single episode, unspecified: Secondary | ICD-10-CM | POA: Diagnosis present

## 2017-06-06 DIAGNOSIS — Z930 Tracheostomy status: Secondary | ICD-10-CM | POA: Diagnosis not present

## 2017-06-06 DIAGNOSIS — W19XXXA Unspecified fall, initial encounter: Secondary | ICD-10-CM | POA: Diagnosis not present

## 2017-06-06 DIAGNOSIS — J384 Edema of larynx: Secondary | ICD-10-CM | POA: Diagnosis present

## 2017-06-06 DIAGNOSIS — Z79890 Hormone replacement therapy (postmenopausal): Secondary | ICD-10-CM | POA: Diagnosis not present

## 2017-06-06 DIAGNOSIS — I10 Essential (primary) hypertension: Secondary | ICD-10-CM | POA: Diagnosis not present

## 2017-06-06 DIAGNOSIS — Z86711 Personal history of pulmonary embolism: Secondary | ICD-10-CM | POA: Diagnosis not present

## 2017-06-06 DIAGNOSIS — Z781 Physical restraint status: Secondary | ICD-10-CM

## 2017-06-06 DIAGNOSIS — D62 Acute posthemorrhagic anemia: Secondary | ICD-10-CM

## 2017-06-06 DIAGNOSIS — Z7984 Long term (current) use of oral hypoglycemic drugs: Secondary | ICD-10-CM

## 2017-06-06 DIAGNOSIS — M4302 Spondylolysis, cervical region: Secondary | ICD-10-CM | POA: Diagnosis present

## 2017-06-06 DIAGNOSIS — E119 Type 2 diabetes mellitus without complications: Secondary | ICD-10-CM

## 2017-06-06 DIAGNOSIS — Z452 Encounter for adjustment and management of vascular access device: Secondary | ICD-10-CM | POA: Diagnosis not present

## 2017-06-06 DIAGNOSIS — Z6839 Body mass index (BMI) 39.0-39.9, adult: Secondary | ICD-10-CM

## 2017-06-06 DIAGNOSIS — J690 Pneumonitis due to inhalation of food and vomit: Secondary | ICD-10-CM | POA: Diagnosis present

## 2017-06-06 DIAGNOSIS — Z90710 Acquired absence of both cervix and uterus: Secondary | ICD-10-CM | POA: Diagnosis not present

## 2017-06-06 DIAGNOSIS — R579 Shock, unspecified: Secondary | ICD-10-CM

## 2017-06-06 DIAGNOSIS — Z4659 Encounter for fitting and adjustment of other gastrointestinal appliance and device: Secondary | ICD-10-CM

## 2017-06-06 DIAGNOSIS — Z96653 Presence of artificial knee joint, bilateral: Secondary | ICD-10-CM | POA: Diagnosis present

## 2017-06-06 DIAGNOSIS — Z86718 Personal history of other venous thrombosis and embolism: Secondary | ICD-10-CM

## 2017-06-06 DIAGNOSIS — E876 Hypokalemia: Secondary | ICD-10-CM | POA: Diagnosis not present

## 2017-06-06 DIAGNOSIS — R0902 Hypoxemia: Secondary | ICD-10-CM | POA: Diagnosis not present

## 2017-06-06 DIAGNOSIS — E039 Hypothyroidism, unspecified: Secondary | ICD-10-CM | POA: Diagnosis present

## 2017-06-06 DIAGNOSIS — N390 Urinary tract infection, site not specified: Secondary | ICD-10-CM | POA: Diagnosis not present

## 2017-06-06 DIAGNOSIS — Z430 Encounter for attention to tracheostomy: Secondary | ICD-10-CM

## 2017-06-06 DIAGNOSIS — J189 Pneumonia, unspecified organism: Secondary | ICD-10-CM | POA: Diagnosis not present

## 2017-06-06 DIAGNOSIS — J9611 Chronic respiratory failure with hypoxia: Secondary | ICD-10-CM

## 2017-06-06 DIAGNOSIS — I2699 Other pulmonary embolism without acute cor pulmonale: Secondary | ICD-10-CM | POA: Diagnosis not present

## 2017-06-06 DIAGNOSIS — Z853 Personal history of malignant neoplasm of breast: Secondary | ICD-10-CM

## 2017-06-06 DIAGNOSIS — Z87891 Personal history of nicotine dependence: Secondary | ICD-10-CM | POA: Diagnosis not present

## 2017-06-06 DIAGNOSIS — F411 Generalized anxiety disorder: Secondary | ICD-10-CM | POA: Diagnosis not present

## 2017-06-06 DIAGNOSIS — R4182 Altered mental status, unspecified: Secondary | ICD-10-CM | POA: Diagnosis present

## 2017-06-06 DIAGNOSIS — G4733 Obstructive sleep apnea (adult) (pediatric): Secondary | ICD-10-CM | POA: Diagnosis present

## 2017-06-06 DIAGNOSIS — K5732 Diverticulitis of large intestine without perforation or abscess without bleeding: Secondary | ICD-10-CM

## 2017-06-06 DIAGNOSIS — Z79899 Other long term (current) drug therapy: Secondary | ICD-10-CM

## 2017-06-06 DIAGNOSIS — A499 Bacterial infection, unspecified: Secondary | ICD-10-CM | POA: Diagnosis not present

## 2017-06-06 DIAGNOSIS — Z9981 Dependence on supplemental oxygen: Secondary | ICD-10-CM

## 2017-06-06 DIAGNOSIS — J9601 Acute respiratory failure with hypoxia: Secondary | ICD-10-CM | POA: Diagnosis present

## 2017-06-06 DIAGNOSIS — F419 Anxiety disorder, unspecified: Secondary | ICD-10-CM | POA: Diagnosis present

## 2017-06-06 DIAGNOSIS — K219 Gastro-esophageal reflux disease without esophagitis: Secondary | ICD-10-CM | POA: Diagnosis present

## 2017-06-06 DIAGNOSIS — G40901 Epilepsy, unspecified, not intractable, with status epilepticus: Secondary | ICD-10-CM | POA: Diagnosis present

## 2017-06-06 DIAGNOSIS — R569 Unspecified convulsions: Secondary | ICD-10-CM | POA: Diagnosis not present

## 2017-06-06 DIAGNOSIS — E1165 Type 2 diabetes mellitus with hyperglycemia: Secondary | ICD-10-CM | POA: Diagnosis present

## 2017-06-06 DIAGNOSIS — Z7901 Long term (current) use of anticoagulants: Secondary | ICD-10-CM | POA: Diagnosis not present

## 2017-06-06 HISTORY — DX: Encephalopathy, unspecified: G93.40

## 2017-06-06 LAB — CBG MONITORING, ED
GLUCOSE-CAPILLARY: 166 mg/dL — AB (ref 65–99)
Glucose-Capillary: 162 mg/dL — ABNORMAL HIGH (ref 65–99)

## 2017-06-06 LAB — COMPREHENSIVE METABOLIC PANEL
ALBUMIN: 3.7 g/dL (ref 3.5–5.0)
ALT: 13 U/L — ABNORMAL LOW (ref 14–54)
ANION GAP: 20 — AB (ref 5–15)
AST: 26 U/L (ref 15–41)
Alkaline Phosphatase: 88 U/L (ref 38–126)
BUN: 7 mg/dL (ref 6–20)
CHLORIDE: 94 mmol/L — AB (ref 101–111)
CO2: 24 mmol/L (ref 22–32)
Calcium: 9.2 mg/dL (ref 8.9–10.3)
Creatinine, Ser: 0.9 mg/dL (ref 0.44–1.00)
GFR calc non Af Amer: >60 mL/min (ref >60)
GLUCOSE: 170 mg/dL — AB (ref 65–99)
POTASSIUM: 3.7 mmol/L (ref 3.5–5.1)
SODIUM: 138 mmol/L (ref 135–145)
Total Bilirubin: 0.6 mg/dL (ref 0.3–1.2)
Total Protein: 7 g/dL (ref 6.5–8.1)

## 2017-06-06 LAB — PROCALCITONIN: Procalcitonin: <0.1 ng/mL

## 2017-06-06 LAB — URINALYSIS, ROUTINE W REFLEX MICROSCOPIC
Bilirubin Urine: NEGATIVE
GLUCOSE, UA: NEGATIVE mg/dL
Hgb urine dipstick: NEGATIVE
Ketones, ur: NEGATIVE mg/dL
Leukocytes, UA: NEGATIVE
Nitrite: NEGATIVE
PH: 6 (ref 5.0–8.0)
Protein, ur: 30 mg/dL — AB
SPECIFIC GRAVITY, URINE: 1.006 (ref 1.005–1.030)
SQUAMOUS EPITHELIAL / LPF: NONE SEEN

## 2017-06-06 LAB — CBC
HCT: 45.5 % (ref 36.0–46.0)
HEMOGLOBIN: 14.6 g/dL (ref 12.0–15.0)
MCH: 31.7 pg (ref 26.0–34.0)
MCHC: 32.1 g/dL (ref 30.0–36.0)
MCV: 98.7 fL (ref 78.0–100.0)
PLATELETS: 323 10*3/uL (ref 150–400)
RBC: 4.61 MIL/uL (ref 3.87–5.11)
RDW: 13.6 % (ref 11.5–15.5)
WBC: 13.1 10*3/uL — ABNORMAL HIGH (ref 4.0–10.5)

## 2017-06-06 LAB — I-STAT ARTERIAL BLOOD GAS, ED
ACID-BASE EXCESS: 8 mmol/L — AB (ref 0.0–2.0)
BICARBONATE: 35.9 mmol/L — AB (ref 20.0–28.0)
O2 SAT: 100 %
PO2 ART: 268 mmHg — AB (ref 83.0–108.0)
TCO2: 38 mmol/L — ABNORMAL HIGH (ref 22–32)
pCO2 arterial: 62.3 mmHg — ABNORMAL HIGH (ref 32.0–48.0)
pH, Arterial: 7.368 (ref 7.350–7.450)

## 2017-06-06 LAB — PROTIME-INR
INR: 1.04
PROTHROMBIN TIME: 13.5 s (ref 11.4–15.2)

## 2017-06-06 LAB — GLUCOSE, CAPILLARY
GLUCOSE-CAPILLARY: 198 mg/dL — AB (ref 65–99)
GLUCOSE-CAPILLARY: 172 mg/dL — AB (ref 65–99)
Glucose-Capillary: 201 mg/dL — ABNORMAL HIGH (ref 65–99)

## 2017-06-06 LAB — RAPID URINE DRUG SCREEN, HOSP PERFORMED
Amphetamines: NOT DETECTED
BARBITURATES: NOT DETECTED
BENZODIAZEPINES: POSITIVE — AB
COCAINE: NOT DETECTED
Opiates: NOT DETECTED
TETRAHYDROCANNABINOL: NOT DETECTED

## 2017-06-06 LAB — TRIGLYCERIDES: TRIGLYCERIDES: 192 mg/dL — AB (ref <150)

## 2017-06-06 LAB — HEPARIN LEVEL (UNFRACTIONATED): Heparin Unfractionated: 0.92 IU/mL — ABNORMAL HIGH (ref 0.30–0.70)

## 2017-06-06 LAB — APTT
APTT: 24 s (ref 24–36)
aPTT: 84 seconds — ABNORMAL HIGH (ref 24–36)

## 2017-06-06 LAB — I-STAT CHEM 8, ED
BUN: 8 mg/dL (ref 6–20)
CALCIUM ION: 1.08 mmol/L — AB (ref 1.15–1.40)
Chloride: 94 mmol/L — ABNORMAL LOW (ref 101–111)
Creatinine, Ser: 0.7 mg/dL (ref 0.44–1.00)
Glucose, Bld: 174 mg/dL — ABNORMAL HIGH (ref 65–99)
HEMATOCRIT: 47 % — AB (ref 36.0–46.0)
HEMOGLOBIN: 16 g/dL — AB (ref 12.0–15.0)
Potassium: 3.6 mmol/L (ref 3.5–5.1)
SODIUM: 136 mmol/L (ref 135–145)
TCO2: 27 mmol/L (ref 22–32)

## 2017-06-06 LAB — MRSA PCR SCREENING: MRSA by PCR: NEGATIVE

## 2017-06-06 LAB — PHOSPHORUS: Phosphorus: 5 mg/dL — ABNORMAL HIGH (ref 2.5–4.6)

## 2017-06-06 LAB — BLOOD GAS, ARTERIAL
Acid-Base Excess: 8.2 mmol/L — ABNORMAL HIGH (ref 0.0–2.0)
BICARBONATE: 33.1 mmol/L — AB (ref 20.0–28.0)
DRAWN BY: 249101
FIO2: 40
MECHVT: 380 mL
O2 Saturation: 94.4 %
PEEP: 5 cmH2O
PO2 ART: 77.3 mmHg — AB (ref 83.0–108.0)
Patient temperature: 98.6
RATE: 14 resp/min
pCO2 arterial: 54.5 mmHg — ABNORMAL HIGH (ref 32.0–48.0)
pH, Arterial: 7.401 (ref 7.350–7.450)

## 2017-06-06 LAB — I-STAT TROPONIN, ED: Troponin i, poc: 0 ng/mL (ref 0.00–0.08)

## 2017-06-06 LAB — DIFFERENTIAL
BASOS ABS: 0 10*3/uL (ref 0.0–0.1)
Basophils Relative: 0 %
EOS ABS: 0.3 10*3/uL (ref 0.0–0.7)
Eosinophils Relative: 2 %
LYMPHS ABS: 3.9 10*3/uL (ref 0.7–4.0)
Lymphocytes Relative: 30 %
MONOS PCT: 7 %
Monocytes Absolute: 0.9 10*3/uL (ref 0.1–1.0)
NEUTROS ABS: 8 10*3/uL — AB (ref 1.7–7.7)
NEUTROS PCT: 61 %

## 2017-06-06 LAB — MAGNESIUM: MAGNESIUM: 1 mg/dL — AB (ref 1.7–2.4)

## 2017-06-06 LAB — TROPONIN I

## 2017-06-06 MED ORDER — FENTANYL CITRATE (PF) 100 MCG/2ML IJ SOLN: 50.0000 ug | INTRAMUSCULAR | Status: DC | PRN

## 2017-06-06 MED ORDER — MIDAZOLAM HCL 2 MG/2ML IJ SOLN: 1.0000 mg | INTRAMUSCULAR | Status: DC | PRN

## 2017-06-06 MED ORDER — SUCCINYLCHOLINE CHLORIDE 20 MG/ML IJ SOLN
INTRAMUSCULAR | Status: AC | PRN
  Administered 2017-06-06: 150 mg via INTRAVENOUS

## 2017-06-06 MED ORDER — LORAZEPAM 2 MG/ML IJ SOLN
INTRAMUSCULAR | Status: AC
  Filled 2017-06-06: qty 2

## 2017-06-06 MED ORDER — SODIUM CHLORIDE 0.9 % IV SOLN
1000.0000 mg | Freq: Once | INTRAVENOUS | Status: AC
  Administered 2017-06-06: 1000 mg via INTRAVENOUS
  Filled 2017-06-06: qty 10

## 2017-06-06 MED ORDER — SODIUM CHLORIDE 0.9 % IV SOLN: 250.0000 mL | INTRAVENOUS | Status: DC | PRN

## 2017-06-06 MED ORDER — PANTOPRAZOLE SODIUM 40 MG IV SOLR
40.0000 mg | Freq: Every day | INTRAVENOUS | Status: DC
  Administered 2017-06-07 (×2): 40 mg via INTRAVENOUS
  Filled 2017-06-06 (×2): qty 40

## 2017-06-06 MED ORDER — LORAZEPAM 2 MG/ML IJ SOLN
INTRAMUSCULAR | Status: AC
  Filled 2017-06-06: qty 1

## 2017-06-06 MED ORDER — LORAZEPAM 2 MG/ML IJ SOLN
2.0000 mg | Freq: Once | INTRAMUSCULAR | Status: AC
  Administered 2017-06-06: 2 mg via INTRAVENOUS

## 2017-06-06 MED ORDER — POTASSIUM CHLORIDE 10 MEQ/100ML IV SOLN
10.0000 meq | INTRAVENOUS | Status: AC
  Administered 2017-06-06 (×4): 10 meq via INTRAVENOUS
  Filled 2017-06-06 (×4): qty 100

## 2017-06-06 MED ORDER — SODIUM CHLORIDE 0.9 % IV SOLN
1000.0000 mg | Freq: Two times a day (BID) | INTRAVENOUS | Status: DC
  Administered 2017-06-07 – 2017-06-10 (×7): 1000 mg via INTRAVENOUS
  Filled 2017-06-06 (×8): qty 10

## 2017-06-06 MED ORDER — SODIUM CHLORIDE 0.9 % IV SOLN
1400.0000 mg | Freq: Once | INTRAVENOUS | Status: AC
  Administered 2017-06-06: 1400 mg via INTRAVENOUS
  Filled 2017-06-06: qty 28

## 2017-06-06 MED ORDER — SODIUM CHLORIDE 0.9 % IV SOLN
INTRAVENOUS | Status: DC
  Administered 2017-06-06 – 2017-06-10 (×8): via INTRAVENOUS

## 2017-06-06 MED ORDER — CALCIUM GLUCONATE 10 % IV SOLN
1.0000 g | Freq: Once | INTRAVENOUS | Status: AC
  Administered 2017-06-06: 1 g via INTRAVENOUS
  Filled 2017-06-06: qty 10

## 2017-06-06 MED ORDER — INSULIN ASPART 100 UNIT/ML ~~LOC~~ SOLN
0.0000 [IU] | SUBCUTANEOUS | Status: DC
  Administered 2017-06-06 – 2017-06-07 (×3): 2 [IU] via SUBCUTANEOUS
  Administered 2017-06-07 (×2): 1 [IU] via SUBCUTANEOUS
  Administered 2017-06-07: 2 [IU] via SUBCUTANEOUS
  Administered 2017-06-08 (×3): 1 [IU] via SUBCUTANEOUS
  Administered 2017-06-08 – 2017-06-09 (×4): 2 [IU] via SUBCUTANEOUS
  Administered 2017-06-09: 1 [IU] via SUBCUTANEOUS
  Administered 2017-06-09 – 2017-06-10 (×3): 2 [IU] via SUBCUTANEOUS

## 2017-06-06 MED ORDER — FENTANYL CITRATE (PF) 100 MCG/2ML IJ SOLN
50.0000 ug | INTRAMUSCULAR | Status: DC | PRN
  Filled 2017-06-06: qty 2

## 2017-06-06 MED ORDER — ETOMIDATE 2 MG/ML IV SOLN
INTRAVENOUS | Status: AC | PRN
  Administered 2017-06-06: 20 mg via INTRAVENOUS

## 2017-06-06 MED ORDER — PROPOFOL 1000 MG/100ML IV EMUL
5.0000 ug/kg/min | INTRAVENOUS | Status: DC
  Administered 2017-06-07: 15 ug/kg/min via INTRAVENOUS
  Administered 2017-06-07: 20 ug/kg/min via INTRAVENOUS
  Filled 2017-06-06 (×2): qty 100

## 2017-06-06 MED ORDER — PROPOFOL 1000 MG/100ML IV EMUL
INTRAVENOUS | Status: AC
  Administered 2017-06-06: 15:00:00
  Filled 2017-06-06: qty 100

## 2017-06-06 MED ORDER — FENTANYL CITRATE (PF) 100 MCG/2ML IJ SOLN
50.0000 ug | INTRAMUSCULAR | Status: DC | PRN
  Administered 2017-06-07 (×2): 50 ug via INTRAVENOUS
  Filled 2017-06-06: qty 2

## 2017-06-06 MED ORDER — MAGNESIUM SULFATE 2 GM/50ML IV SOLN
2.0000 g | Freq: Once | INTRAVENOUS | Status: AC
  Administered 2017-06-06: 2 g via INTRAVENOUS
  Filled 2017-06-06: qty 50

## 2017-06-06 MED ORDER — HEPARIN (PORCINE) IN NACL 100-0.45 UNIT/ML-% IJ SOLN
1100.0000 [IU]/h | INTRAMUSCULAR | Status: DC
  Administered 2017-06-06: 1100 [IU]/h via INTRAVENOUS
  Administered 2017-06-07: 1000 [IU]/h via INTRAVENOUS
  Administered 2017-06-08: 900 [IU]/h via INTRAVENOUS
  Administered 2017-06-09: 1000 [IU]/h via INTRAVENOUS
  Filled 2017-06-06 (×8): qty 250

## 2017-06-06 MED ORDER — LEVOTHYROXINE SODIUM 100 MCG IV SOLR
50.0000 ug | Freq: Every day | INTRAVENOUS | Status: DC
  Administered 2017-06-07 – 2017-06-11 (×5): 50 ug via INTRAVENOUS
  Filled 2017-06-06 (×5): qty 5

## 2017-06-06 MED ORDER — LORAZEPAM 2 MG/ML IJ SOLN
1.0000 mg | INTRAMUSCULAR | Status: DC | PRN
  Administered 2017-06-07 – 2017-06-08 (×3): 2 mg via INTRAVENOUS
  Administered 2017-06-09: 4 mg via INTRAVENOUS
  Filled 2017-06-06: qty 2
  Filled 2017-06-06 (×4): qty 1

## 2017-06-06 MED ORDER — DOCUSATE SODIUM 50 MG/5ML PO LIQD
100.0000 mg | Freq: Two times a day (BID) | ORAL | Status: DC | PRN
  Administered 2017-06-13 – 2017-06-15 (×2): 100 mg
  Filled 2017-06-06 (×2): qty 10

## 2017-06-06 MED ORDER — BISACODYL 10 MG RE SUPP: 10.0000 mg | Freq: Every day | RECTAL | Status: DC | PRN

## 2017-06-06 NOTE — Progress Notes (Signed)
LTM EEG serviced, reset.  EEG recording, wave forms visualized on monitor.  Night RN educated and verbalized understanding.

## 2017-06-06 NOTE — ED Notes (Signed)
Mali RN, Sport and exercise psychologist, and [employer] RN attempted [employer] placement x2 each

## 2017-06-06 NOTE — ED Notes (Signed)
Attempted report x 1 to [facility] on 2M. Extension and name left for call back for report

## 2017-06-06 NOTE — Progress Notes (Signed)
ANTICOAGULATION CONSULT NOTE - Initial Consult  Pharmacy Consult for Heparin Indication: pulmonary embolus  Allergies  Allergen Reactions  . Allopurinol Nausea And Vomiting    Patient Measurements: Height: 5' 1" (154.9 cm) Weight: 158 lb (71.7 kg) IBW/kg (Calculated) : 47.8 Heparin Dosing Weight:63.3kg  Vital Signs: Temp: 95.6 F (35.3 C) (11/21 1442) Temp Source: Oral (11/21 1442) BP: 133/81 (11/21 1511) Pulse Rate: 73 (11/21 1511)  Labs: Recent Labs    06/06/17 1406 06/06/17 1411  HGB 14.6 16.0*  HCT 45.5 47.0*  PLT 323  --   APTT 24  --   LABPROT 13.5  --   INR 1.04  --   CREATININE 0.90 0.70    Estimated Creatinine Clearance: 54.2 mL/min (by C-G formula based on SCr of 0.7 mg/dL).   Medical History: Past Medical History:  Diagnosis Date  . Arthritis    "back, hands" (07/22/2015)  . Cancer of left breast ([place]) 2006   S/P lumpectomy  . Complication of anesthesia    "brief breathing problem at surgery center in ~ 2005 when I had gallbladder OR"  . Depression   . DVT (deep venous thrombosis) ([place]) 03/2013   LLE  . GERD (gastroesophageal reflux disease)   . Hyperlipidemia   . Hypertension   . Hypothyroidism   . OSA treated with BiPAP   . Pulmonary embolism ([place]) 03/2013  . Thyroid disease    Hypothyroid  . Type II diabetes mellitus (HCC)    Assessment: 76 Yo female patient arrives via EMS with altered mental status, Experience seizures on route to hospital. 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  Monitor aPTT until levels correlate.   Anticoag Xarelto PTA, CrCL 54.28m/min, aPTT subtherapeutic 24, Hgb16, HCT 47, Plt 323, no signs/sx active bleeding   Goal of Therapy:  APTT 66-102s Monitor platelets by anticoagulation protocol: Yes    Plan:  Start heparin infusion at 1100 units/hr Continue to monitor H&H and platelets   F/U BL aPTT 6hr heparin level and daily apTT, CBC and heparin  level  MFelicity Pellegrini PharmD Candidate '19 05/21/2017 3:30 PM

## 2017-06-06 NOTE — ED Notes (Signed)
CCM paged to Dr. Thomasene Lot to [phone] @ 1506.

## 2017-06-06 NOTE — Progress Notes (Signed)
STAT LTM started in [facility]; Dr Dimitriu notified. Pt will be moving to 63m

## 2017-06-06 NOTE — ED Notes (Signed)
Pt EEG machine transported with pt up to 18M per instructions given by EEG nurse. Upon arrival to 18M EEG screen went black. Machine was plugged in accordingly to the wall, the patient, and ethernet chord. Screen remained black. EEG nurse at [phone] called x 10 with no answer or call back to come troubleshoot machine to confirm that it is on. Leata Mouse RN informed and extension for EEG nurse passed off.

## 2017-06-06 NOTE — Progress Notes (Signed)
Per husband patient was not feeling well this am, last known well 0900.  Later in the morning husband found her with altered mental status and called EMS.  Per EMS patient alert with left side weakness.  En route Code Stroke called.  Prior to arrival to hospital patient with witnessed grand mal seizure, treated with 2.5 Versed.  Upon arrival to ED patient not responding to staff.  Left gaze, and nystagmus. Patient moving Right arm and leg spontaneously.  Stat labs and head CT done.  While in CT patient starting to move left leg and minimally left arm.  She became very restless after the non contrast CT.  Pt continued with Left gaze and  Nystagmus.  Dilated Pupils Returned to ED, 63m Ativan given IVP,  1gm Keppra given.  Pt continued with left gaze deviation.  Intubated and sedated with propofol. Fosphenytoin given.  multiple attempts to place OG tube unsuccessful.  PCXR done.  Eyes without gaze deviation.   EEG in progress.  Code Stroke canceled.   MD spoke with husband regarding patient status.  Plan fu CT in AM per MD Plan admit ICU

## 2017-06-06 NOTE — Progress Notes (Signed)
Pt. Transferred to 2MW without EEG Tech Santiago Glad)  to assist with EEG monitor setup, RN reported that EEG tech advice her in the ED to plug monitor to wall ans it will restart the system EEG, but ED RN unsuccessful.  EEG team was called and paged several time no response per ED nurse Sarah .

## 2017-06-06 NOTE — ED Triage Notes (Signed)
Pt brought in by EMS due to having AMS. Pt LSN was 9 am. Per EMS, pt had left sided facial droop and left sided weakness. Enroute pt had seizure. Pt has no hx of seizures. Pt received 2.72m of versed by EMS. Pt responsive when name is called.

## 2017-06-06 NOTE — H&P (Signed)
PULMONARY / CRITICAL CARE MEDICINE   Name: Amanda Short MRN: [id number] DOB: 1941-07-14    ADMISSION DATE:  06/06/2017 CONSULTATION DATE:  06/06/17  REFERRING MD:  Thomasene Lot  CHIEF COMPLAINT:  AMS  HISTORY OF PRESENT ILLNESS:  Pt is encephelopathic; therefore, this HPI is obtained from chart review. Amanda Short is a 76 y.o. female with PMH as outlined below.  She was brought to [facility] ED 11/21 with AMS.  She was last seen normal 4 - 5 hours prior.  Initially called as code stroke. En route to ED, had seizure for which she was given 2.29m of versed.  In ED, had recurrent CT after returning from CT scan.  Received 669mativan and later had decrease in mental status / post ictal state where she was not protecting airway. She was subsequently intubated and PCCM was asked to admit to the ICU.  Neurology consulted by EDP.  No known hx of seizures.  PAST MEDICAL HISTORY :  She  has a past medical history of Arthritis, Cancer of left breast ([facility]([id number] Complication of anesthesia, Depression, D[place] (deep [place]) ([facility]03/[place]), GERD (gastroesophageal reflux disease), Hyperlipidemia, Hypertension, Hypothyroidism, OSA treated with BiPAP, Pulmonary embolism ([facility](03/2013), Thyroid disease, and Type II diabetes mellitus ([place]  PAST SURGICAL HISTORY: She  has a past surgical history that includes Joint replacement; Bunionectomy with hammertoe reconstruction (Left); Total knee arthroplasty (Bilateral, 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  Allergies  Allergen Reactions  . Allopurinol Nausea And Vomiting    No current facility-administered medications on file prior to encounter.    Current Outpatient Medications on File Prior to Encounter  Medication Sig  . amoxicillin-clavulanate (AUGMENTIN)  875-125 MG tablet Take 1 tablet every 12 (twelve) hours by mouth.  . calcium-vitamin D (OSCAL 500/200 D-3) 500-200 MG-UNIT tablet Take 1 tablet 2 (two) times daily by mouth.  . DULoxetine (CYMBALTA) 60 MG capsule Take 60 mg by mouth daily.  . febuxostat (ULORIC) 40 MG tablet Take 40 mg daily by mouth.  . furosemide (LASIX) 40 MG tablet Take 1 tablet (40 mg total) daily by mouth. Hold until follow up with PCP  . levothyroxine (SYNTHROID, LEVOTHROID) 100 MCG tablet Take 100 mcg by mouth daily before breakfast.  . metFORMIN (GLUCOPHAGE) 500 MG tablet Take 1,000 mg daily by mouth. Take 1000 mg in the morning and 500 mg in the evening   . metoprolol (TOPROL-XL) 200 MG 24 hr tablet Take 100 mg by mouth at bedtime.   . montelukast (SINGULAIR) 10 MG tablet Take 10 mg at bedtime by mouth.  . Multiple Vitamin (MULTIVITAMIN WITH MINERALS) TABS tablet Take 1 tablet daily by mouth.  . Marland Kitchenmeprazole (PRILOSEC) 40 MG capsule Take 40 mg by mouth 2 (two) times daily.  . Rivaroxaban (XARELTO PO) Take 15 mg by mouth at bedtime.   . Marland Kitchenpironolactone (ALDACTONE) 25 MG tablet Take 50 mg daily by mouth.    FAMILY HISTORY:  Her indicated that the status of her sister is unknown. She indicated that the status of her unknown relative is unknown.   SOCIAL HISTORY: She  reports that she has quit smoking. Her smoking use included cigarettes. She has a 30.00 pack-year smoking history. she has never used smokeless tobacco. She reports that she does not drink alcohol or use drugs.  REVIEW OF SYSTEMS:   Unable to obtain as pt is encephalopathic.  SUBJECTIVE:  On  vent, unresponsive.  VITAL SIGNS: BP (!) 169/114 (BP Location: Left Arm)   Temp (!) 95.6 F (35.3 C) (Oral)   Resp (!) 24   Ht 5' 1" (1.549 m)   Wt 71.7 kg (158 lb)   SpO2 99%   BMI 29.85 kg/m   HEMODYNAMICS:    VENTILATOR SETTINGS:    INTAKE / OUTPUT: No intake/output data recorded.   PHYSICAL EXAMINATION: General: Adult female, in NAD. Neuro:  Sedated, non-responsive. HEENT: [place]/AT. PERRL, sclerae anicteric. Cardiovascular: RRR, no M/R/G. Lungs: Respirations even and unlabored.  Coarse rhonchi bilaterally. Abdomen: BS x 4, soft, NT/ND.  Musculoskeletal: No gross deformities, no edema.  Skin: Intact, warm, no rashes.  LABS:  BMET Recent Labs  Lab 06/06/17 1406 06/06/17 1411  NA 138 136  K 3.7 3.6  CL 94* 94*  CO2 24  --   BUN 7 8  CREATININE 0.90 0.70  GLUCOSE 170* 174*    Electrolytes Recent Labs  Lab 06/06/17 1406  CALCIUM 9.2    CBC Recent Labs  Lab 06/06/17 1406 06/06/17 1411  WBC 13.1*  --   HGB 14.6 16.0*  HCT 45.5 47.0*  PLT 323  --     Coag's Recent Labs  Lab 06/06/17 1406  APTT 24  INR 1.04    Sepsis Markers No results for input(s): LATICACIDVEN, PROCALCITON, O2SATVEN in the last 168 hours.  ABG No results for input(s): PHART, PCO2ART, PO2ART in the last 168 hours.  Liver Enzymes Recent Labs  Lab 06/06/17 1406  AST 26  ALT 13*  ALKPHOS 88  BILITOT 0.6  ALBUMIN 3.7    Cardiac Enzymes No results for input(s): TROPONINI, PROBNP in the last 168 hours.  Glucose Recent Labs  Lab 06/06/17 1406 06/06/17 1441  GLUCAP 162* 166*    Imaging Ct Head Code Stroke Wo Contrast`  Result Date: 06/06/2017 CLINICAL DATA:  Code stroke. 76 year old female with right side weakness. Status post seizure. EXAM: CT HEAD WITHOUT CONTRAST TECHNIQUE: Contiguous axial images were obtained from the base of the skull through the vertex without intravenous contrast. COMPARISON:  Head CT without contrast 04/02/2013 and earlier. FINDINGS: Brain: Asymmetric chronic left frontal lobe and bilateral perisylvian and parietal lobe cerebral volume loss appears stable to mildly progressed since 2014. Mildly increased ventricular size since that time is likely ex vacuo. Patchy bilateral periventricular white matter hypodensity appears stable. No transependymal edema suspected. No acute intracranial hemorrhage  identified. No cortically based acute infarct identified. No midline shift, mass effect, or evidence of intracranial mass lesion. Vascular: Calcified atherosclerosis at the skull base. No suspicious intracranial vascular hyperdensity. Skull: Chronic hyperostosis of the calvarium. No acute osseous abnormality identified. Sinuses/Orbits: Visualized paranasal sinuses and mastoids are stable and well pneumatized. Other: Visualized orbit soft tissues are within normal limits. No acute scalp soft tissue findings, mild chronic right posterior convexity scarring at the site of a 02/2013 scalp hematoma. Negative visualized noncontrast deep soft tissue spaces of the face. ASPECTS [facility] Stroke Program Early CT Score) - Ganglionic level infarction (caudate, lentiform nuclei, internal capsule, insula, M1-M3 cortex): 7 - Supraganglionic infarction (M4-M6 cortex): 3 Total score (0-10 with 10 being normal): 10 IMPRESSION: 1. No acute cortically based infarct or intracranial hemorrhage identified. 2. ASPECTS is 10. 3. Increased chronic nonspecific cerebral volume loss since 2014. 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 4. The above was relayed via text pager to Dr. Bruce Donath on 06/06/2017 at 14:39 . Electronically Signed   By: Herminio Heads.D.  On: 06/06/2017 14:39    STUDIES:  CT head 11/21 > no acute process.  CULTURES: None.  ANTIBIOTICS: None.  SIGNIFICANT EVENTS: 11/21 > admit.  LINES/TUBES: ETT 11/21 >   DISCUSSION: 76 y.o. female admitted 11/21 with AMS.  Had seizure en route to ED and again while in ED.  Received midazolam and lorazepam and later required intubation due to inability to protect the airway.  ASSESSMENT / PLAN:  PULMONARY A: Respiratory insufficiency - unable to protect airway after receiving benzo's for seizures. Hx PE and DVT (2014), OSA (on BiPAP). P:   Full vent support. Wean as able. VAP prevention measures. SBT in AM if mental status allows. Heparin gtt  in lieu of preadmission xarelto. CXR in AM.  CARDIOVASCULAR A:  Hypotension - after pushing fosphenytoin. Hx HTN, HLD. P:  Continue 500cc bolus now. Heparin gtt in lieu of preadmission xarelto. Hold preadmission furosemide, metoprolol, rivaroxaban, spironolactone.  RENAL A:   Hypocalcemia. AGMA - presumed due to lactate + metformin. P:   1g Ca gluconate. NS @ 100. BMP in AM.  GASTROINTESTINAL A:   GERD. Nutrition. P:   SUP: Pantoprazole. NPO.  HEMATOLOGIC / ONCOLOGIC A:   VTE Prophylaxis. Hx left breast CA (2006) - s/p lumpectomy. P:  SCD's / heparin. CBC in AM.  INFECTIOUS A:   No indication of infection. P:   Monitor clinically.  ENDOCRINE A:   Hx DM, hypothyroidism.   P:   SSI. Continue preadmission synthroid, change to IV formulation. Hold preadmission metformin.  NEUROLOGIC A:   Seizures - no known hx.  S/p loading with keppra and fosphenytoin. Sedation due to mechanical ventilation. P:   Neurology consulted by EDP. AED's per neurology. Sedation:  Propofol gtt / Fentanyl PRN / Lorazepam PRN. RASS goal: 0 to -1. Daily WUA. Hold preadmission duloxetine.  Family updated: None available.  Interdisciplinary Family Meeting v Palliative Care Meeting:  Due by: 06/12/17.  CC time: 35 min.   Montey Hora, [place] [facility] Pager: [phone]  or [phone] 06/06/2017, 3:56 PM  Attending Note:  76 year old female with recent discharge for cardiac concerns who presents to [facility] with AMS and status epilepticus.  Patient had a seizure with EMS in the field then another seizure in the ER and was given 6 units of ativan and subsequently intubated.  On exam, lung are rhonchorous and patient is not following any commands.  I reviewed CXR myself, ETT is in good position with some atelectasis noted.  PCCM was asked to admit and neurology will consult.  Check ABG now and adjust vent for ABG.  Hold off propofol given  hypotension after initiation of phos-fen.  Change sedation to fentanyl drip and versed pushes.  Seizure control and stroke work up per neurology.  Begin TF in AM.  Adjust electrolytes.  Once seizure is undercontrol will begin weaning trials.  The patient is critically ill with multiple organ systems failure and requires high complexity decision making for assessment and support, frequent evaluation and titration of therapies, application of advanced monitoring technologies and extensive interpretation of multiple databases.   Critical Care Time devoted to patient care services described in this note is  35  Minutes. This time reflects time of care of this signee Dr Jennet Maduro. This critical care time does not reflect procedure time, or teaching time or supervisory time of PA/NP/Med student/Med Resident etc but could involve care discussion time.  Corran Lalone G.  Nelda Marseille, M.D. [facility] Pulmonary/Critical Care Medicine. Pager: [phone]. After hours pager: [phone].

## 2017-06-06 NOTE — ED Notes (Signed)
Pt being intubated by EDP at this time.

## 2017-06-06 NOTE — ED Provider Notes (Signed)
Procedure Name: Intubation Date/Time: 06/06/2017 3:14 PM Performed by: Carlisle Cater, PA-C Pre-anesthesia Checklist: Patient identified, Suction available, Patient being monitored, Timeout performed and Emergency Drugs available Oxygen Delivery Method: Non-rebreather mask Preoxygenation: Pre-oxygenation with 100% oxygen Induction Type: IV induction and Rapid sequence Ventilation: Mask ventilation without difficulty Laryngoscope Size: Glidescope Grade View: Grade III Tube size: 7.5 mm Number of attempts: 1 Airway Equipment and Method: Rigid stylet Placement Confirmation: ETT inserted through vocal cords under direct vision,  Positive ETCO2,  CO2 detector and Breath sounds checked- equal and bilateral Secured at: 22 cm Tube secured with: ETT holder Difficulty Due To: Difficulty was unanticipated        Carlisle Cater, PA-C 06/06/17 1516    [place], Fredia Sorrow, MD 06/07/17 2325

## 2017-06-06 NOTE — ED Notes (Signed)
Code Stroke cancelled @ 8118 via [place] w/ [facility].

## 2017-06-06 NOTE — ED Provider Notes (Signed)
[facility] EMERGENCY DEPARTMENT Provider Note   CSN: [id number] Arrival date & time: 06/06/17  1402     History   Chief Complaint No chief complaint on file.   HPI Amanda Short is a 76 y.o. female.  HPI   76 year old female presenting with altered mental status.  Patient found acting abnormal today by husband.  He reports that she was walking to the bathroom and seemed confused.  No focal weakness.  Called EMS.  On EMS arrival she was slightly out of it but still able to say her name.  Then patient developed seizures on the EMS truck.  Went straight to his head CT on arrival, called code stroke.  Patient postictal in CT.  Unable to stay still for full CT comploment, and then started seizing again on arrival to her room.  Past Medical History:  Diagnosis Date  . Arthritis    "back, hands" (07/22/2015)  . Cancer of left breast ([facility]) 2006   S/P lumpectomy  . Complication of anesthesia    "brief breathing problem at surgery center in ~ 2005 when I had gallbladder OR"  . Depression   . DVT (deep venous thrombosis) ([place]) 03/2013   LLE  . GERD (gastroesophageal reflux disease)   . Hyperlipidemia   . Hypertension   . Hypothyroidism   . OSA treated with BiPAP   . Pulmonary embolism ([place]) 03/2013  . Thyroid disease    Hypothyroid  . Type II diabetes mellitus [facility])     Patient Active Problem List   Diagnosis Date Noted  . Diverticulitis of large intestine without perforation or abscess without bleeding   . Abdominal pain   . Non-intractable cyclical vomiting with nausea   . Abnormal CT of the abdomen   . Hypocalcemia 05/27/2017  . 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 Colitis 05/27/2017  . Diabetic foot infection ([place]) 07/21/2015  . Diabetes mellitus type 2, controlled ([place]) 07/21/2015  . Cellulitis of foot, right 07/21/2015  . Cellulitis of right foot 07/21/2015  . Acute pulmonary embolism ([place]) 04/01/2013  . DVT (deep venous thrombosis) ([place])  04/01/2013  . HX: breast cancer 04/01/2013  . Concussion 04/01/2013  . Diabetes ([place]) 04/01/2013  . Essential hypertension, benign 04/01/2013  . Unspecified hypothyroidism 04/01/2013  . OSA on CPAP 04/01/2013    Past Surgical History:  Procedure Laterality Date  . ABDOMINAL HYSTERECTOMY  1970s  . BREAST BIOPSY Left 2006  . BREAST LUMPECTOMY Left 2006  . BUNIONECTOMY WITH HAMMERTOE RECONSTRUCTION Left   . ESOPHAGOGASTRODUODENOSCOPY N/A 05/29/2017   Procedure: ESOPHAGOGASTRODUODENOSCOPY (EGD);  Surgeon: Irene Shipper, MD;  Location: [facility];  Service: Endoscopy;  Laterality: N/A;  . JOINT REPLACEMENT    . LAPAROSCOPIC CHOLECYSTECTOMY  ~ 2005  . TOTAL HIP ARTHROPLASTY Left 2010  . TOTAL KNEE ARTHROPLASTY Bilateral 2005-2006    OB History    No data available       Home Medications    Prior to Admission medications   Medication Sig Start Date End Date Taking? Authorizing Provider  amoxicillin-clavulanate (AUGMENTIN) 875-125 MG tablet Take 1 tablet every 12 (twelve) hours by mouth. 05/30/17   Barton Dubois, MD  calcium-vitamin D (OSCAL 500/200 D-3) 500-200 MG-UNIT tablet Take 1 tablet 2 (two) times daily by mouth. 05/30/17 06/09/18  Barton Dubois, MD  DULoxetine (CYMBALTA) 60 MG capsule Take 60 mg by mouth daily.    [provider]  febuxostat (ULORIC) 40 MG tablet Take 40 mg daily by mouth.    [provider]  furosemide (LASIX) 40 MG tablet Take 1 tablet (40 mg total) daily by mouth. Hold until follow up with PCP 05/30/17   Barton Dubois, MD  levothyroxine (SYNTHROID, LEVOTHROID) 100 MCG tablet Take 100 mcg by mouth daily before breakfast.    [provider]  metFORMIN (GLUCOPHAGE) 500 MG tablet Take 1,000 mg daily by mouth. Take 1000 mg in the morning and 500 mg in the evening     [provider]  metoprolol (TOPROL-XL) 200 MG 24 hr tablet Take 100 mg by mouth at bedtime.     [provider]  montelukast (SINGULAIR) 10 MG  tablet Take 10 mg at bedtime by mouth.    [provider]  Multiple Vitamin (MULTIVITAMIN WITH MINERALS) TABS tablet Take 1 tablet daily by mouth.    [provider]  omeprazole (PRILOSEC) 40 MG capsule Take 40 mg by mouth 2 (two) times daily.    [provider]  Rivaroxaban (XARELTO PO) Take 15 mg by mouth at bedtime.     [provider]  spironolactone (ALDACTONE) 25 MG tablet Take 50 mg daily by mouth.    [provider]    Family History Family History  Problem Relation Age of Onset  . Diabetes Unknown   . Diabetes Mellitus II Sister     Social History Social History   Tobacco Use  . Smoking status: Former Smoker    Packs/day: 1.00    Years: 30.00    Pack years: 30.00    Types: Cigarettes  . Smokeless tobacco: Never Used  . Tobacco comment: "quit smoking cigarettes in the 1990s"  Substance Use Topics  . Alcohol use: No  . Drug use: No     Allergies   Allopurinol   Review of Systems Review of Systems  Unable to perform ROS: Mental status change     Physical Exam Updated Vital Signs There were no vitals taken for this visit.  Physical Exam  Constitutional: She appears well-developed and well-nourished.  Altered, status.  HENT:  Head: Normocephalic and atraumatic.  Eyes: Right eye exhibits no discharge.  Cardiovascular: Normal rate, regular rhythm and normal heart sounds.  No murmur heard. Pulmonary/Chest: Effort normal and breath sounds normal. She has no wheezes. She has no rales.  Abdominal: Soft. She exhibits no distension. There is no tenderness.  Neurological:  Patient's eyes deviated to left, nystagmus to the left.  Not moving left upper extremity.  Otherwise other 3 extremities moving to pain.  Blink  reflexes negative.  Skin: Skin is warm and dry. She is not diaphoretic.  Nursing note and vitals reviewed.    ED Treatments / Results  Labs (all labs ordered are listed, but only abnormal results are  displayed) Labs Reviewed  CBC - Abnormal; Notable for the following components:      Result Value   WBC 13.1 (*)    All other components within normal limits  CBG MONITORING, ED - Abnormal; Notable for the following components:   Glucose-Capillary 162 (*)    All other components within normal limits  I-STAT CHEM 8, ED - Abnormal; Notable for the following components:   Chloride 94 (*)    Glucose, Bld 174 (*)    Calcium, Ion 1.08 (*)    Hemoglobin 16.0 (*)    HCT 47.0 (*)    All other components within normal limits  DIFFERENTIAL  PROTIME-INR  APTT  COMPREHENSIVE METABOLIC PANEL  I-STAT TROPONIN, ED  CBG MONITORING, ED  EKG  EKG Interpretation None       Radiology No results found.  Procedures Procedures (including critical care time)  CRITICAL CARE Performed by: Gardiner Sleeper Total critical care time: 60 minutes Critical care time was exclusive of separately billable procedures and treating other patients. Critical care was necessary to treat or prevent imminent or life-threatening deterioration. Critical care was time spent personally by me on the following activities: development of treatment plan with patient and/or surrogate as well as nursing, discussions with consultants, evaluation of patient's response to treatment, examination of patient, obtaining history from patient or surrogate, ordering and performing treatments and interventions, ordering and review of laboratory studies, ordering and review of radiographic studies, pulse oximetry and re-evaluation of patient's condition.   Medications Ordered in ED Medications  levETIRAcetam (KEPPRA) 1,000 mg in sodium chloride 0.9 % 100 mL IVPB (not administered)     Initial Impression / Assessment and Plan / ED Course  I have reviewed the triage vital signs and the nursing notes.  Pertinent labs & imaging results that were available during my care of the patient were reviewed by me and considered in my  medical decision making (see chart for details).    76 year old female presenting with altered mental status.  Patient found acting abnormal today by husband.  He reports that she was walking to the bathroom and seemed confused.  No focal weakness.  Called EMS.  On EMS arrival she was slightly out of it but still able to say her name.  Then patient developed seizures on the EMS truck.  Went straight to his head CT on arrival, called code stroke.  Patient postictal in CT.  Unable to stay still for full CT comploment, and then started seizing again on arrival to her room.  Initially called code stroke, workup for code stroke canceled after noted to be in status.  Patient continued to seize despite 3 doses of 2 mg of Ativan.  In addition she received 1 g of Keppra.  Patient continued to seize.  Ordered fosphenytoin from pharmacy, took greater than 10 minutes to arrival.  Patient began to get sleepy after all the medications but still with seizure activity.  Given the fact that fosphenytoin can cause hypotension, decision made to intubate before giving the fosphenytoin.Alecia Lemming intubated patient.  Propofol used after intubation for its sedative and antiepileptic nature.   Dr. Cheral Marker involved with case.    Final Clinical Impressions(s) / ED Diagnoses   Final diagnoses:  None    ED Discharge Orders    None       Macarthur Critchley, MD 06/06/17 1541

## 2017-06-06 NOTE — Consult Note (Addendum)
NEURO HOSPITALIST CONSULT NOTE   Requestig physician: Dr. Thomasene Lot  Reason for Consult: Status epilepticus  History obtained from:  EMS, Family and Chart     HPI:                                                                                                                                          Amanda Short is an 76 y.o. female who presented to the ED initially as a code stroke. LKN was 0900 at home. When her husband interacted with her again at about 11:30 to noon, he noted that she was not responding normally to verbal input. He called EMS. On arrival they noted that she was weak on her left side with a leftward gaze deviation. When they tried to get her up, she was leaning to the left and they also noted a left facial droop. En route, she had a GTC seizure and was administered 2.5 mg of Versed.  On arrival, she was not responding to questions, with head and eyes deviated slightly to the left and little to no left sided movement. She was taken for a STAT CT. She began exhibiting motor activity that was most consistent with a seizure in CT. CT head showed no acute abnormality. She was then taken to the trauma bay following for possible intubation as she was beginning to exhibit signs of possible inability to protect her airway. 2 mg Ativan was administered followed by some decrease in the tonic leftward eye deviation and nystagmus, but without complete resolution. Additional 2 mg Ativan administered, again with incomplete therapeutic effect. Keppra 1000 mg IV infusion was begun and a 3rd dose of 2 mg IV Ativan was given. Clinical seizure activity continued and decision was made to load with Dilantin. Subsequently, she was intubated for airway protection and sedated with propofol. Code stroke was cancelled. A STAT video EEG was placed.   Past Medical History:  Diagnosis Date  . Arthritis    "back, hands" (07/22/2015)  . Cancer of left breast ([facility]) 2006   S/P lumpectomy    . Complication of anesthesia    "brief breathing problem at surgery center in ~ 2005 when I had gallbladder OR"  . Depression   . DVT (deep venous thrombosis) ([place]) 03/2013   LLE  . GERD (gastroesophageal reflux disease)   . Hyperlipidemia   . Hypertension   . Hypothyroidism   . OSA treated with BiPAP   . Pulmonary embolism ([place]) 03/2013  . Thyroid disease    Hypothyroid  . Type II diabetes mellitus ([place])     Past Surgical History:  Procedure Laterality Date  . ABDOMINAL HYSTERECTOMY  1970s  . BREAST BIOPSY Left 2006  . BREAST LUMPECTOMY Left 2006  . BUNIONECTOMY WITH HAMMERTOE RECONSTRUCTION Left   .  ESOPHAGOGASTRODUODENOSCOPY N/A 05/29/2017   Procedure: ESOPHAGOGASTRODUODENOSCOPY (EGD);  Surgeon: Irene Shipper, MD;  Location: [facility];  Service: Endoscopy;  Laterality: N/A;  . JOINT REPLACEMENT    . LAPAROSCOPIC CHOLECYSTECTOMY  ~ 2005  . TOTAL HIP ARTHROPLASTY Left 2010  . TOTAL KNEE ARTHROPLASTY Bilateral 2005-2006    Family History  Problem Relation Age of Onset  . Diabetes Unknown   . Diabetes Mellitus II Sister    Social History:  reports that she has quit smoking. Her smoking use included cigarettes. She has a 30.00 pack-year smoking history. she has never used smokeless tobacco. She reports that she does not drink alcohol or use drugs.  Allergies  Allergen Reactions  . Allopurinol Nausea And Vomiting    HOME MEDICATIONS:                                                                                                                       ROS:                                                                                                                                       Unable to obtain due to AMS.   Blood pressure (!) 169/114, temperature (!) 95.6 F (35.3 C), temperature source Oral, resp. rate (!) 24, height _0  (1.549 m), weight 71.7 kg (158 lb), SpO2 99 %.   General Examination:                                                                                                       HEENT-  [place]/AT  Ext: Warm and well perfused Skin-dry  Neurological Examination Mental Status:  In the context of ongoing seizure activity she is nonverbal. Does not follow commands. Waxing and waning mentation with agitation alternating with stupor. Does not fixate or direct gaze towards visual stimuli.  Cranial Nerves: II: No blink to threat bilaterally. Right pupil 7 mm >> 5 mm. Left pupil 5 mm >> 4 mm  III,IV,  VI: Eyes conjugately deviated slightly to left of midline with intermittent fast beating nystagmus to the left. Cannot overcome with oculocephalic maneuver.  V,VII: No definite facial droop. Does not grimace to noxious VIII: No response to verbal stimuli IX,X: Unable to assess XI: Head tonically rotated approximately 30 degrees towards the left for most of the examination.  XII: Unable to assess Motor: RUE: Flails intermittently with >/= 4/5 strength RLE: Flails intermittently with >/= 4/5 strength LUE: Intermittent extensor posturing with intervening weakness. Also noted is intermittent jerking of fingers in flexion LLE: Moves less than the right. Intermittently moves with 3/5 strength when agitated. Decreased responses to noxious relative to right. Mildly increased extensor tone.  Sensory: Decreased responses to noxious x 4, worse on the left Deep Tendon Reflexes: No clear asymmetry Plantars:  Right: upgoing   Left: upgoing Cerebellar/Gait: Unable to assess  Lab Results: Basic Metabolic Panel: Recent Labs  Lab 06/06/17 1406 06/06/17 1411  NA 138 136  K 3.7 3.6  CL 94* 94*  CO2 24  --   GLUCOSE 170* 174*  BUN 7 8  CREATININE 0.90 0.70  CALCIUM 9.2  --     Liver Function Tests: Recent Labs  Lab 06/06/17 1406  AST 26  ALT 13*  ALKPHOS 88  BILITOT 0.6  PROT 7.0  ALBUMIN 3.7   No results for input(s): LIPASE, AMYLASE in the last 168 hours. No results for input(s): AMMONIA in the last 168 hours.  CBC: Recent Labs  Lab  06/06/17 1406 06/06/17 1411  WBC 13.1*  --   NEUTROABS 8.0*  --   HGB 14.6 16.0*  HCT 45.5 47.0*  MCV 98.7  --   PLT 323  --     Cardiac Enzymes: No results for input(s): CKTOTAL, CKMB, CKMBINDEX, TROPONINI in the last 168 hours.  Lipid Panel: No results for input(s): CHOL, TRIG, HDL, CHOLHDL, VLDL, LDLCALC in the last 168 hours.  CBG: Recent Labs  Lab 06/06/17 1406 06/06/17 1441  GLUCAP 162* 166*    Microbiology: Results for orders placed or performed during the hospital encounter of 03/31/13  Urine culture     Status: None   Collection Time: 03/31/13  8:06 PM  Result Value Ref Range Status   Specimen Description URINE, CLEAN CATCH  Final   Special Requests NONE  Final   Culture  Setup Time   Final    04/01/2013 01:23 Performed at [facility] Performed at [facility]  Final   Culture NO GROWTH Performed at [facility]  Final   Report Status 04/01/2013 FINAL  Final    Coagulation Studies: Recent Labs    06/06/17 1406  LABPROT 13.5  INR 1.04    Imaging: Ct Head Code Stroke Wo Contrast`  Result Date: 06/06/2017 CLINICAL DATA:  Code stroke. 76 year old female with right side weakness. Status post seizure. EXAM: CT HEAD WITHOUT CONTRAST TECHNIQUE: Contiguous axial images were obtained from the base of the skull through the vertex without intravenous contrast. COMPARISON:  Head CT without contrast 04/02/2013 and earlier. FINDINGS: Brain: Asymmetric chronic left frontal lobe and bilateral perisylvian and parietal lobe cerebral volume loss appears stable to mildly progressed since 2014. Mildly increased ventricular size since that time is likely ex vacuo. Patchy bilateral periventricular white matter hypodensity appears stable. No transependymal edema suspected. No acute intracranial hemorrhage identified. No cortically based acute infarct identified. No midline shift, mass effect, or evidence of intracranial mass  lesion. Vascular: Calcified  atherosclerosis at the skull base. No suspicious intracranial vascular hyperdensity. Skull: Chronic hyperostosis of the calvarium. No acute osseous abnormality identified. Sinuses/Orbits: Visualized paranasal sinuses and mastoids are stable and well pneumatized. Other: Visualized orbit soft tissues are within normal limits. No acute scalp soft tissue findings, mild chronic right posterior convexity scarring at the site of a 02/2013 scalp hematoma. Negative visualized noncontrast deep soft tissue spaces of the face. ASPECTS [facility] Stroke Program Early CT Score) - Ganglionic level infarction (caudate, lentiform nuclei, internal capsule, insula, M1-M3 cortex): 7 - Supraganglionic infarction (M4-M6 cortex): 3 Total score (0-10 with 10 being normal): 10 IMPRESSION: 1. No acute cortically based infarct or intracranial hemorrhage identified. 2. ASPECTS is 10. 3. Increased chronic nonspecific cerebral volume loss since 2014. 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 4. The above was relayed via text pager to Dr. Bruce Donath on 06/06/2017 at 14:39 . Electronically Signed   By: Genevie Ann M.D.   On: 06/06/2017 14:39    Assessment: 76 year old female presenting with status epilepticus. No prior history of seizures. No recent illnesses or other precipitating events described by her husband. No benzodiazepine use at home and not withdrawing from alcohol.  1. Low magnesium of 1.0 a possible precipitant of her seizures.  2. Total serum calcium is normal with borderline low ionized calcium. PTH is low.  3. Na is normal.  4. Leukocytosis may be secondary to seizure 5. TSH is normal.  6. CT shows no acute cortically based infarct or intracranial hemorrhage. There has been increased chronic nonspecific cerebral volume loss since 2014.   7. History of hypothyroidism noted. 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   Recommendations: 1. Loaded with Keppra.  Continue at 1000 mg IV BID.  2. STAT LTM EEG has been ordered. 3. Loaded with fosphenytoin. Hold off on scheduled dosing unless EEG shows electrographic seizures.  4. Correct serum Ca and Mg levels.  5. Agree with IV sedation on propofol and midazolam. Will titrate as needed based upon EEG tracings.  6. Low pretest probability of stroke given overall clinical picture. Overall risks of delaying EEG for obtaining a CTA study significantly outweigh benefits 7. MRI brain when able 8. Discussed with Dr. Thomasene Lot 9. Discussed with the patient's husband who expressed understanding and agreement with the plan 10. Obtain serum antithyroglobulin and anti-thyroid peroxidase antibody titers. 11. ESR and c-reactive protein  Addendum: Preliminary review of LTM EEG while sedated and intubated, after Keppra and fosphenytoin loading doses, reveals diffuse low voltage with occasional focal short duration moderate-amplitude slow waves, occasional sharp waves and no definite electrographic seizures.   45 minutes spent in the emergent Neurological evaluation and management of this critically ill patient  Electronically signed: Dr. Kerney Elbe 06/06/2017, 3:09 PM

## 2017-06-06 NOTE — Progress Notes (Signed)
RT at bedside awaiting pts return from CT for potential intubation per Dr. Thomasene Lot.

## 2017-06-07 ENCOUNTER — Inpatient Hospital Stay (HOSPITAL_COMMUNITY): Payer: Medicare Other

## 2017-06-07 ENCOUNTER — Encounter (HOSPITAL_COMMUNITY): Payer: Self-pay

## 2017-06-07 ENCOUNTER — Other Ambulatory Visit: Payer: Self-pay

## 2017-06-07 DIAGNOSIS — R569 Unspecified convulsions: Secondary | ICD-10-CM

## 2017-06-07 LAB — APTT: aPTT: 104 seconds — ABNORMAL HIGH (ref 24–36)

## 2017-06-07 LAB — GLUCOSE, CAPILLARY
GLUCOSE-CAPILLARY: 107 mg/dL — AB (ref 65–99)
GLUCOSE-CAPILLARY: 111 mg/dL — AB (ref 65–99)
GLUCOSE-CAPILLARY: 124 mg/dL — AB (ref 65–99)
GLUCOSE-CAPILLARY: 139 mg/dL — AB (ref 65–99)
GLUCOSE-CAPILLARY: 140 mg/dL — AB (ref 65–99)
GLUCOSE-CAPILLARY: 156 mg/dL — AB (ref 65–99)
Glucose-Capillary: 161 mg/dL — ABNORMAL HIGH (ref 65–99)

## 2017-06-07 LAB — BASIC METABOLIC PANEL
ANION GAP: 10 (ref 5–15)
BUN: 8 mg/dL (ref 6–20)
CALCIUM: 8.3 mg/dL — AB (ref 8.9–10.3)
CHLORIDE: 95 mmol/L — AB (ref 101–111)
CO2: 31 mmol/L (ref 22–32)
CREATININE: 0.74 mg/dL (ref 0.44–1.00)
GFR calc non Af Amer: >60 mL/min (ref >60)
Glucose, Bld: 167 mg/dL — ABNORMAL HIGH (ref 65–99)
Potassium: 4.2 mmol/L (ref 3.5–5.1)
SODIUM: 136 mmol/L (ref 135–145)

## 2017-06-07 LAB — BLOOD GAS, ARTERIAL
ACID-BASE EXCESS: 7.9 mmol/L — AB (ref 0.0–2.0)
BICARBONATE: 32.9 mmol/L — AB (ref 20.0–28.0)
Drawn by: 42624
FIO2: 40
LHR: 14 {breaths}/min
O2 Saturation: 96 %
PATIENT TEMPERATURE: 98.6
PEEP/CPAP: 5 cmH2O
PO2 ART: 89.8 mmHg (ref 83.0–108.0)
VT: 380 mL
pCO2 arterial: 55.6 mmHg — ABNORMAL HIGH (ref 32.0–48.0)
pH, Arterial: 7.39 (ref 7.350–7.450)

## 2017-06-07 LAB — MAGNESIUM: Magnesium: 1.7 mg/dL (ref 1.7–2.4)

## 2017-06-07 LAB — SEDIMENTATION RATE: SED RATE: 11 mm/h (ref 0–22)

## 2017-06-07 LAB — C-REACTIVE PROTEIN: CRP: 1.8 mg/dL — AB (ref <1.0)

## 2017-06-07 LAB — TROPONIN I

## 2017-06-07 LAB — PHOSPHORUS: PHOSPHORUS: 4.6 mg/dL (ref 2.5–4.6)

## 2017-06-07 LAB — HEPARIN LEVEL (UNFRACTIONATED): Heparin Unfractionated: 0.96 IU/mL — ABNORMAL HIGH (ref 0.30–0.70)

## 2017-06-07 MED ORDER — DEXMEDETOMIDINE HCL IN NACL 400 MCG/100ML IV SOLN
0.4000 ug/kg/h | INTRAVENOUS | Status: DC
  Administered 2017-06-07: 1 ug/kg/h via INTRAVENOUS
  Administered 2017-06-07: 0.6 ug/kg/h via INTRAVENOUS
  Administered 2017-06-08: 1.2 ug/kg/h via INTRAVENOUS
  Administered 2017-06-08: 0.5 ug/kg/h via INTRAVENOUS
  Administered 2017-06-08: 1.2 ug/kg/h via INTRAVENOUS
  Administered 2017-06-09 (×3): 1 ug/kg/h via INTRAVENOUS
  Administered 2017-06-09 (×2): 1.003 ug/kg/h via INTRAVENOUS
  Administered 2017-06-10 (×2): 1 ug/kg/h via INTRAVENOUS
  Administered 2017-06-10: 0.8 ug/kg/h via INTRAVENOUS
  Administered 2017-06-10: 1.003 ug/kg/h via INTRAVENOUS
  Administered 2017-06-11 (×3): 1 ug/kg/h via INTRAVENOUS
  Administered 2017-06-11: 0.6 ug/kg/h via INTRAVENOUS
  Administered 2017-06-12: 0.8 ug/kg/h via INTRAVENOUS
  Administered 2017-06-12: 0.7 ug/kg/h via INTRAVENOUS
  Filled 2017-06-07 (×22): qty 100

## 2017-06-07 MED ORDER — FENTANYL CITRATE (PF) 100 MCG/2ML IJ SOLN
50.0000 ug | INTRAMUSCULAR | Status: DC | PRN
  Administered 2017-06-08 – 2017-06-09 (×6): 100 ug via INTRAVENOUS
  Filled 2017-06-07 (×6): qty 2

## 2017-06-07 MED ORDER — SODIUM CHLORIDE 0.9 % IV SOLN
0.4000 ug/kg/h | INTRAVENOUS | Status: DC
  Administered 2017-06-07: 0.4 ug/kg/h via INTRAVENOUS
  Filled 2017-06-07: qty 2

## 2017-06-07 MED ORDER — ORAL CARE MOUTH RINSE
15.0000 mL | Freq: Four times a day (QID) | OROMUCOSAL | Status: DC
  Administered 2017-06-07 – 2017-06-14 (×31): 15 mL via OROMUCOSAL

## 2017-06-07 MED ORDER — METOPROLOL TARTRATE 5 MG/5ML IV SOLN
INTRAVENOUS | Status: AC
  Administered 2017-06-07: 5 mg
  Filled 2017-06-07: qty 5

## 2017-06-07 MED ORDER — CHLORHEXIDINE GLUCONATE 0.12% ORAL RINSE (MEDLINE KIT)
15.0000 mL | Freq: Two times a day (BID) | OROMUCOSAL | Status: DC
  Administered 2017-06-06 – 2017-06-14 (×15): 15 mL via OROMUCOSAL

## 2017-06-07 NOTE — Progress Notes (Signed)
SLP Cancellation Note  Patient Details Name: Amanda Short MRN: [id number] DOB: Mar 25, 1941   Cancelled treatment:       Reason Eval/Treat Not Completed: Patient not medically ready (intubated). Will f/u for readiness.   Germain Osgood 06/07/2017, 8:10 AM  Germain Osgood, M.A. CCC-SLP [phone]

## 2017-06-07 NOTE — Progress Notes (Signed)
LTM EEG impedances checked, reset electrodes as needed.  No skin breakdown noted.

## 2017-06-07 NOTE — Procedures (Signed)
  Video EEG Monitoring Report    Dates of monitoring:   06/06/17 _0 :41 to 06/07/17 _1 :30  Recording day:    1 (started on 11/21) EEG Number:    [id number] Requesting provider:   Bruce Donath Interpreting physician:  Becky Sax, MD  CPT:  [phone] ICD-10:   R56.9   Present History: 76 year old woman who presented with altered mental status and acute repetitive seizures. Continuous VEEG requested to evaluate for seizures.    EEG Classification  Abnormal, Coma    There is no PDR  HR  70 bpm and regular    Background abnormalities:   1. Continuous slow, generalized    Periodic, rhythmic or epileptiform abnormalities:   none   Ictal phenomena:  none    EEG DETAILS:  TYPE OF RECORDING: At least 18-channel digital EEG with using a standard international 10-20 placement with additional EEG electrodes, and 1 additional channel dedicated to the EKG, at a sampling rate of at least 256 Hz. Video was recorded throughout the study. The recording was interpreted using digital review software allowing for montage reformatting, gain and filter changes. Each page was reviewed manually. Automatic spike and seizure detection software and quantitative analysis tools were used as needed.   Description of EEG features: The recording reveals a continuous, moderately variable and reactive, symmetric background.  This consists of diffuse medium amplitude slow delta and overriding, waxing and waning faster theta and spindly beta activity, both maximum central-parasagittal.  In response to stimulation there are partial arousals, characterized by high amplitude rhythmic delta, then partial attenuation of the slowest activity and emergence of a more continuous theta background.    Push Button Events: none  Interpretation: This EEG is indicative of a severe diffuse encephalopathy, non-specific as to etiology. Sedating medication effect cannot be excluded. No epileptiform discharges, seizures,  focal or lateralizing signs are seen.

## 2017-06-07 NOTE — Progress Notes (Addendum)
Subjective: No clinical seizure events overnight.   Objective: Current vital signs: BP 121/62   Pulse 71   Temp 98.1 F (36.7 C) (Oral)   Resp 15   Ht _0  (1.549 m)   Wt 75.4 kg (166 lb 3.6 oz)   SpO2 99%   BMI 31.41 kg/m  Vital signs in last 24 hours: Temp:  [95.6 F (35.3 C)-98.3 F (36.8 C)] 98.1 F (36.7 C) (11/22 0809) Pulse Rate:  [64-81] 71 (11/22 0900) Resp:  [14-24] 15 (11/22 0900) BP: (86-169)/(54-114) 121/62 (11/22 0900) SpO2:  [96 %-100 %] 99 % (11/22 0900) FiO2 (%):  [40 %-100 %] 40 % (11/22 0827) Weight:  [71.7 kg (158 lb)-75.4 kg (166 lb 3.6 oz)] 75.4 kg (166 lb 3.6 oz) (11/22 0458)  Intake/Output from previous day: 11/21 0701 - 11/22 0700 In: 2273.9 [I.V.:1585.9; IV Piggyback:688] Out: 835 [Urine:685; Emesis/NG output:150] Intake/Output this shift: Total I/O In: 349.9 [I.V.:349.9] Out: 55 [Urine:55] Nutritional status: Diet NPO time specified  Neurologic Exam: After propofol held for 75 minutes Ment: Eyes closed, do not open to noxious stimulation. Semipurposeful weak thrashing movements of upper extremities to noxious stimulation. Does not follow commands or attempt to communicate.  CN: Pupils sluggishly reactive and equal. Does not fixate on visual stimuli or blink to threat with eyes held open. Eyes conjugately at midline with weak oculocephalic reflex elicitable. No nystagmus.  Motor/Sensory: Semipurposeful weak thrashing movements of upper extremities to noxious stimulation, 3/5 bilaterally. Withdraws bilateral lower extremities with 3/5 strength bilaterally.  Reflexes: No asymmetry noted. Toes upgoing.  Lab Results: Results for orders placed or performed during the hospital encounter of 06/06/17 (from the past 48 hour(s))  CBG monitoring, ED     Status: Abnormal   Collection Time: 06/06/17  2:06 PM  Result Value Ref Range   Glucose-Capillary 162 (H) 65 - 99 mg/dL  Protime-INR     Status: None   Collection Time: 06/06/17  2:06 PM  Result Value  Ref Range   Prothrombin Time 13.5 11.4 - 15.2 seconds   INR 1.04   APTT     Status: None   Collection Time: 06/06/17  2:06 PM  Result Value Ref Range   aPTT 24 24 - 36 seconds  CBC     Status: Abnormal   Collection Time: 06/06/17  2:06 PM  Result Value Ref Range   WBC 13.1 (H) 4.0 - 10.5 K/uL   RBC 4.61 3.87 - 5.11 MIL/uL   Hemoglobin 14.6 12.0 - 15.0 g/dL   HCT 45.5 36.0 - 46.0 %   MCV 98.7 78.0 - 100.0 fL   MCH 31.7 26.0 - 34.0 pg   MCHC 32.1 30.0 - 36.0 g/dL   RDW 13.6 11.5 - 15.5 %   Platelets 323 150 - 400 K/uL  Differential     Status: Abnormal   Collection Time: 06/06/17  2:06 PM  Result Value Ref Range   Neutrophils Relative % 61 %   Lymphocytes Relative 30 %   Monocytes Relative 7 %   Eosinophils Relative 2 %   Basophils Relative 0 %   Neutro Abs 8.0 (H) 1.7 - 7.7 K/uL   Lymphs Abs 3.9 0.7 - 4.0 K/uL   Monocytes Absolute 0.9 0.1 - 1.0 K/uL   Eosinophils Absolute 0.3 0.0 - 0.7 K/uL   Basophils Absolute 0.0 0.0 - 0.1 K/uL   WBC Morphology ATYPICAL LYMPHOCYTES   Comprehensive metabolic panel     Status: Abnormal   Collection Time: 06/06/17  2:06  PM  Result Value Ref Range   Sodium 138 135 - 145 mmol/L   Potassium 3.7 3.5 - 5.1 mmol/L   Chloride 94 (L) 101 - 111 mmol/L   CO2 24 22 - 32 mmol/L   Glucose, Bld 170 (H) 65 - 99 mg/dL   BUN 7 6 - 20 mg/dL   Creatinine, Ser 0.90 0.44 - 1.00 mg/dL   Calcium 9.2 8.9 - 10.3 mg/dL   Total Protein 7.0 6.5 - 8.1 g/dL   Albumin 3.7 3.5 - 5.0 g/dL   AST 26 15 - 41 U/L   ALT 13 (L) 14 - 54 U/L   Alkaline Phosphatase 88 38 - 126 U/L   Total Bilirubin 0.6 0.3 - 1.2 mg/dL   GFR calc non Af Amer >60 >60 mL/min   GFR calc Af Amer >60 >60 mL/min    Comment: (NOTE) The eGFR has been calculated using the CKD EPI equation. This calculation has not been validated in all clinical situations. eGFR's persistently <60 mL/min signify possible Chronic Kidney Disease.    Anion gap 20 (H) 5 - 15  I-stat troponin, ED     Status: None    Collection Time: 06/06/17  2:10 PM  Result Value Ref Range   Troponin i, poc 0.00 0.00 - 0.08 ng/mL   Comment 3            Comment: Due to the release kinetics of cTnI, a negative result within the first hours of the onset of symptoms does not rule out myocardial infarction with certainty. If myocardial infarction is still suspected, repeat the test at appropriate intervals.   I-Stat Chem 8, ED     Status: Abnormal   Collection Time: 06/06/17  2:11 PM  Result Value Ref Range   Sodium 136 135 - 145 mmol/L   Potassium 3.6 3.5 - 5.1 mmol/L   Chloride 94 (L) 101 - 111 mmol/L   BUN 8 6 - 20 mg/dL   Creatinine, Ser 0.70 0.44 - 1.00 mg/dL   Glucose, Bld 174 (H) 65 - 99 mg/dL   Calcium, Ion 1.08 (L) 1.15 - 1.40 mmol/L   TCO2 27 22 - 32 mmol/L   Hemoglobin 16.0 (H) 12.0 - 15.0 g/dL   HCT 47.0 (H) 36.0 - 46.0 %  CBG monitoring, ED     Status: Abnormal   Collection Time: 06/06/17  2:41 PM  Result Value Ref Range   Glucose-Capillary 166 (H) 65 - 99 mg/dL   Comment 1 Notify RN    Comment 2 Document in Chart   Rapid urine drug screen (hospital performed)     Status: Abnormal   Collection Time: 06/06/17  3:47 PM  Result Value Ref Range   Opiates NONE DETECTED NONE DETECTED   Cocaine NONE DETECTED NONE DETECTED   Benzodiazepines POSITIVE (A) NONE DETECTED   Amphetamines NONE DETECTED NONE DETECTED   Tetrahydrocannabinol NONE DETECTED NONE DETECTED   Barbiturates NONE DETECTED NONE DETECTED    Comment:        DRUG SCREEN FOR MEDICAL PURPOSES ONLY.  IF CONFIRMATION IS NEEDED FOR ANY PURPOSE, NOTIFY LAB WITHIN 5 DAYS.        LOWEST DETECTABLE LIMITS FOR URINE DRUG SCREEN Drug Class       Cutoff (ng/mL) Amphetamine      1000 Barbiturate      200 Benzodiazepine   677 Tricyclics       034 Opiates          300 Cocaine  300 THC              50   Urinalysis, Routine w reflex microscopic     Status: Abnormal   Collection Time: 06/06/17  3:47 PM  Result Value Ref Range    Color, Urine STRAW (A) YELLOW   APPearance CLEAR CLEAR   Specific Gravity, Urine 1.006 1.005 - 1.030   pH 6.0 5.0 - 8.0   Glucose, UA NEGATIVE NEGATIVE mg/dL   Hgb urine dipstick NEGATIVE NEGATIVE   Bilirubin Urine NEGATIVE NEGATIVE   Ketones, ur NEGATIVE NEGATIVE mg/dL   Protein, ur 30 (A) NEGATIVE mg/dL   Nitrite NEGATIVE NEGATIVE   Leukocytes, UA NEGATIVE NEGATIVE   RBC / HPF 0-5 0 - 5 RBC/hpf   WBC, UA 0-5 0 - 5 WBC/hpf   Bacteria, UA RARE (A) NONE SEEN   Squamous Epithelial / LPF NONE SEEN NONE SEEN  I-Stat arterial blood gas, ED     Status: Abnormal   Collection Time: 06/06/17  4:23 PM  Result Value Ref Range   pH, Arterial 7.368 7.350 - 7.450   pCO2 arterial 62.3 (H) 32.0 - 48.0 mmHg   pO2, Arterial 268.0 (H) 83.0 - 108.0 mmHg   Bicarbonate 35.9 (H) 20.0 - 28.0 mmol/L   TCO2 38 (H) 22 - 32 mmol/L   O2 Saturation 100.0 %   Acid-Base Excess 8.0 (H) 0.0 - 2.0 mmol/L   Patient temperature HIDE    Collection site RADIAL, ALLEN'S TEST ACCEPTABLE    Drawn by Operator    Sample type ARTERIAL   Glucose, capillary     Status: Abnormal   Collection Time: 06/06/17  5:30 PM  Result Value Ref Range   Glucose-Capillary 201 (H) 65 - 99 mg/dL   Comment 1 Capillary Specimen    Comment 2 Notify RN   Glucose, capillary     Status: Abnormal   Collection Time: 06/06/17  5:38 PM  Result Value Ref Range   Glucose-Capillary 198 (H) 65 - 99 mg/dL   Comment 1 Capillary Specimen    Comment 2 Notify RN   MRSA PCR Screening     Status: None   Collection Time: 06/06/17  6:00 PM  Result Value Ref Range   MRSA by PCR NEGATIVE NEGATIVE    Comment:        The GeneXpert MRSA Assay (FDA approved for NASAL specimens only), is one component of a comprehensive MRSA colonization surveillance program. It is not intended to diagnose MRSA infection nor to guide or monitor treatment for MRSA infections.   Procalcitonin     Status: None   Collection Time: 06/06/17  6:47 PM  Result Value Ref Range    Procalcitonin <0.10 ng/mL    Comment:        Interpretation: PCT (Procalcitonin) <= 0.5 ng/mL: Systemic infection (sepsis) is not likely. Local bacterial infection is possible. (NOTE)         ICU PCT Algorithm               Non ICU PCT Algorithm    ----------------------------     ------------------------------         PCT < 0.25 ng/mL                 PCT < 0.1 ng/mL     Stopping of antibiotics            Stopping of antibiotics       strongly encouraged.  strongly encouraged.    ----------------------------     ------------------------------       PCT level decrease by               PCT < 0.25 ng/mL       >= 80% from peak PCT       OR PCT 0.25 - 0.5 ng/mL          Stopping of antibiotics                                             encouraged.     Stopping of antibiotics           encouraged.    ----------------------------     ------------------------------       PCT level decrease by              PCT >= 0.25 ng/mL       < 80% from peak PCT        AND PCT >= 0.5 ng/mL            Continuin g antibiotics                                              encouraged.       Continuing antibiotics            encouraged.    ----------------------------     ------------------------------     PCT level increase compared          PCT > 0.5 ng/mL         with peak PCT AND          PCT >= 0.5 ng/mL             Escalation of antibiotics                                          strongly encouraged.      Escalation of antibiotics        strongly encouraged.   Troponin I     Status: None   Collection Time: 06/06/17  6:47 PM  Result Value Ref Range   Troponin I <0.03 <0.03 ng/mL  Magnesium     Status: Abnormal   Collection Time: 06/06/17  6:47 PM  Result Value Ref Range   Magnesium 1.0 (L) 1.7 - 2.4 mg/dL  Phosphorus     Status: Abnormal   Collection Time: 06/06/17  6:47 PM  Result Value Ref Range   Phosphorus 5.0 (H) 2.5 - 4.6 mg/dL  Triglycerides     Status: Abnormal    Collection Time: 06/06/17  6:47 PM  Result Value Ref Range   Triglycerides 192 (H) <150 mg/dL  Glucose, capillary     Status: Abnormal   Collection Time: 06/06/17  7:55 PM  Result Value Ref Range   Glucose-Capillary 172 (H) 65 - 99 mg/dL   Comment 1 Notify RN   Blood gas, arterial     Status: Abnormal   Collection Time: 06/06/17 10:23 PM  Result Value Ref Range   FIO2 40.00    Delivery systems VENTILATOR    Mode PRESSURE  REGULATED VOLUME CONTROL    VT 380 mL   LHR 14 resp/min   Peep/cpap 5.0 cm H20   pH, Arterial 7.401 7.350 - 7.450   pCO2 arterial 54.5 (H) 32.0 - 48.0 mmHg   pO2, Arterial 77.3 (L) 83.0 - 108.0 mmHg   Bicarbonate 33.1 (H) 20.0 - 28.0 mmol/L   Acid-Base Excess 8.2 (H) 0.0 - 2.0 mmol/L   O2 Saturation 94.4 %   Patient temperature 98.6    Collection site RIGHT RADIAL    Drawn by [phone]    Sample type ARTERIAL DRAW    Allens test (pass/fail) PASS PASS  Heparin level (unfractionated)     Status: Abnormal   Collection Time: 06/06/17 11:04 PM  Result Value Ref Range   Heparin Unfractionated 0.92 (H) 0.30 - 0.70 IU/mL    Comment:        IF HEPARIN RESULTS ARE BELOW EXPECTED VALUES, AND PATIENT DOSAGE HAS BEEN CONFIRMED, SUGGEST FOLLOW UP TESTING OF ANTITHROMBIN III LEVELS.   APTT     Status: Abnormal   Collection Time: 06/06/17 11:04 PM  Result Value Ref Range   aPTT 84 (H) 24 - 36 seconds    Comment:        IF BASELINE aPTT IS ELEVATED, SUGGEST PATIENT RISK ASSESSMENT BE USED TO DETERMINE APPROPRIATE ANTICOAGULANT THERAPY.   Glucose, capillary     Status: Abnormal   Collection Time: 06/07/17 12:15 AM  Result Value Ref Range   Glucose-Capillary 161 (H) 65 - 99 mg/dL   Comment 1 Notify RN   Troponin I     Status: None   Collection Time: 06/07/17 12:29 AM  Result Value Ref Range   Troponin I <0.03 <0.03 ng/mL  Basic metabolic panel     Status: Abnormal   Collection Time: 06/07/17 12:29 AM  Result Value Ref Range   Sodium 136 135 - 145 mmol/L    Potassium 4.2 3.5 - 5.1 mmol/L   Chloride 95 (L) 101 - 111 mmol/L   CO2 31 22 - 32 mmol/L   Glucose, Bld 167 (H) 65 - 99 mg/dL   BUN 8 6 - 20 mg/dL   Creatinine, Ser 0.74 0.44 - 1.00 mg/dL   Calcium 8.3 (L) 8.9 - 10.3 mg/dL   GFR calc non Af Amer >60 >60 mL/min   GFR calc Af Amer >60 >60 mL/min    Comment: (NOTE) The eGFR has been calculated using the CKD EPI equation. This calculation has not been validated in all clinical situations. eGFR's persistently <60 mL/min signify possible Chronic Kidney Disease.    Anion gap 10 5 - 15  Magnesium     Status: None   Collection Time: 06/07/17 12:29 AM  Result Value Ref Range   Magnesium 1.7 1.7 - 2.4 mg/dL  Phosphorus     Status: None   Collection Time: 06/07/17 12:29 AM  Result Value Ref Range   Phosphorus 4.6 2.5 - 4.6 mg/dL  Glucose, capillary     Status: Abnormal   Collection Time: 06/07/17  4:29 AM  Result Value Ref Range   Glucose-Capillary 111 (H) 65 - 99 mg/dL   Comment 1 Notify RN   Blood gas, arterial     Status: Abnormal   Collection Time: 06/07/17  5:48 AM  Result Value Ref Range   FIO2 40.00    Delivery systems VENTILATOR    Mode PRESSURE REGULATED VOLUME CONTROL    VT 380 mL   LHR 14 resp/min   Peep/cpap 5.0 cm H20  pH, Arterial 7.390 7.350 - 7.450   pCO2 arterial 55.6 (H) 32.0 - 48.0 mmHg   pO2, Arterial 89.8 83.0 - 108.0 mmHg   Bicarbonate 32.9 (H) 20.0 - 28.0 mmol/L   Acid-Base Excess 7.9 (H) 0.0 - 2.0 mmol/L   O2 Saturation 96.0 %   Patient temperature 98.6    Collection site RIGHT RADIAL    Drawn by [phone]    Sample type ARTERIAL DRAW    Allens test (pass/fail) PASS PASS  Heparin level (unfractionated)     Status: Abnormal   Collection Time: 06/07/17  6:07 AM  Result Value Ref Range   Heparin Unfractionated 0.96 (H) 0.30 - 0.70 IU/mL    Comment:        IF HEPARIN RESULTS ARE BELOW EXPECTED VALUES, AND PATIENT DOSAGE HAS BEEN CONFIRMED, SUGGEST FOLLOW UP TESTING OF ANTITHROMBIN III LEVELS.   APTT      Status: Abnormal   Collection Time: 06/07/17  6:07 AM  Result Value Ref Range   aPTT 104 (H) 24 - 36 seconds    Comment:        IF BASELINE aPTT IS ELEVATED, SUGGEST PATIENT RISK ASSESSMENT BE USED TO DETERMINE APPROPRIATE ANTICOAGULANT THERAPY.   Glucose, capillary     Status: Abnormal   Collection Time: 06/07/17  7:55 AM  Result Value Ref Range   Glucose-Capillary 140 (H) 65 - 99 mg/dL  Glucose, capillary     Status: Abnormal   Collection Time: 06/07/17 11:41 AM  Result Value Ref Range   Glucose-Capillary 156 (H) 65 - 99 mg/dL   Comment 1 Capillary Specimen    Comment 2 Notify RN     Recent Results (from the past 240 hour(s))  MRSA PCR Screening     Status: None   Collection Time: 06/06/17  6:00 PM  Result Value Ref Range Status   MRSA by PCR NEGATIVE NEGATIVE Final    Comment:        The GeneXpert MRSA Assay (FDA approved for NASAL specimens only), is one component of a comprehensive MRSA colonization surveillance program. It is not intended to diagnose MRSA infection nor to guide or monitor treatment for MRSA infections.     Lipid Panel Recent Labs    06/06/17 1847  TRIG 192*    Studies/Results: Dg Chest Port 1 View  Result Date: 06/07/2017 CLINICAL DATA:  Endotracheal tube position EXAM: PORTABLE CHEST 1 VIEW COMPARISON:  06/06/2017 FINDINGS: Endotracheal tube in good position.  NG in the stomach. Mild progression of bibasilar atelectasis/ infiltrate. Negative for heart failure. Small left effusion IMPRESSION: Endotracheal tube remains in good position. Progression of bibasilar airspace disease left greater than right Electronically Signed   By: Franchot Gallo M.D.   On: 06/07/2017 06:54   Dg Chest Portable 1 View  Result Date: 06/06/2017 CLINICAL DATA:  Initial evaluation for intubation. EXAM: PORTABLE CHEST 1 VIEW COMPARISON:  Prior radiograph from 05/27/2017. FINDINGS: Endotracheal tube in place with tip positioned 4.3 cm above the carina. Mild  cardiomegaly, stable. Mediastinal silhouette normal. Lungs mildly hypoinflated. Patchy left basilar opacity may reflect atelectasis or infiltrate. No pulmonary edema. No definite pleural effusion. No pneumothorax. No acute osseus abnormality. Severe degenerative changes about the shoulders bilaterally. IMPRESSION: 1. Tip of the endotracheal tube well positioned 4.3 cm above the carina. 2. Shallow lung inflation with patchy left basilar opacity. Atelectasis is favored. Electronically Signed   By: Jeannine Boga M.D.   On: 06/06/2017 15:18   Dg Abd Portable 1v  Result Date:  06/06/2017 CLINICAL DATA:  Orogastric tube placement. EXAM: PORTABLE ABDOMEN - 1 VIEW COMPARISON:  None. FINDINGS: Patient is rotated to the right. Orogastric tube is seen with tip overlying the distal stomach. No evidence of dilated bowel loops. Surgical clips from prior cholecystectomy. Lumbar spine degenerative changes and left hip prosthesis noted. IMPRESSION: Orogastric tube tip overlies the distal stomach. Electronically Signed   By: Earle Gell M.D.   On: 06/06/2017 22:08   Ct Head Code Stroke Wo Contrast`  Result Date: 06/06/2017 CLINICAL DATA:  Code stroke. 76 year old female with right side weakness. Status post seizure. EXAM: CT HEAD WITHOUT CONTRAST TECHNIQUE: Contiguous axial images were obtained from the base of the skull through the vertex without intravenous contrast. COMPARISON:  Head CT without contrast 04/02/2013 and earlier. FINDINGS: Brain: Asymmetric chronic left frontal lobe and bilateral perisylvian and parietal lobe cerebral volume loss appears stable to mildly progressed since 2014. Mildly increased ventricular size since that time is likely ex vacuo. Patchy bilateral periventricular white matter hypodensity appears stable. No transependymal edema suspected. No acute intracranial hemorrhage identified. No cortically based acute infarct identified. No midline shift, mass effect, or evidence of intracranial  mass lesion. Vascular: Calcified atherosclerosis at the skull base. No suspicious intracranial vascular hyperdensity. Skull: Chronic hyperostosis of the calvarium. No acute osseous abnormality identified. Sinuses/Orbits: Visualized paranasal sinuses and mastoids are stable and well pneumatized. Other: Visualized orbit soft tissues are within normal limits. No acute scalp soft tissue findings, mild chronic right posterior convexity scarring at the site of a 02/2013 scalp hematoma. Negative visualized noncontrast deep soft tissue spaces of the face. ASPECTS [facility] Stroke Program Early CT Score) - Ganglionic level infarction (caudate, lentiform nuclei, internal capsule, insula, M1-M3 cortex): 7 - Supraganglionic infarction (M4-M6 cortex): 3 Total score (0-10 with 10 being normal): 10 IMPRESSION: 1. No acute cortically based infarct or intracranial hemorrhage identified. 2. ASPECTS is 10. 3. Increased chronic nonspecific cerebral volume loss since 2014. 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 4. The above was relayed via text pager to Dr. Bruce Donath on 06/06/2017 at 14:39 . Electronically Signed   By: Genevie Ann M.D.   On: 06/06/2017 14:39    Medications:  Scheduled: . chlorhexidine gluconate (MEDLINE KIT)  15 mL Mouth Rinse BID  . insulin aspart  0-9 Units Subcutaneous Q4H  . levothyroxine  50 mcg Intravenous Daily  . mouth rinse  15 mL Mouth Rinse QID  . pantoprazole (PROTONIX) IV  40 mg Intravenous QHS   Continuous: . sodium chloride    . sodium chloride 100 mL/hr at 06/07/17 1109  . dexmedetomidine (PRECEDEX) IV infusion 0.8 mcg/kg/hr (06/07/17 1120)  . heparin 1,000 Units/hr (06/07/17 1000)  . levETIRAcetam Stopped (06/07/17 0245)  . propofol (DIPRIVAN) infusion Stopped (06/07/17 0931)   LTM EEG report 06/06/17 _0 :41 to 06/07/17 _1 :30: The recording reveals a continuous, moderately variable and reactive, symmetric background. This consists of diffuse medium amplitude slow delta  and overriding, waxing and waning faster theta and spindly beta activity, both maximum central-parasagittal. In response to stimulation there are partial arousals, characterized by high amplitude rhythmic delta, then partial attenuation of the slowest activity and emergence of a more continuous theta background. Interpretation: This EEG is indicative of a severe diffuse encephalopathy, non-specific as to etiology. Sedating medication effect cannot be excluded. No epileptiform discharges, seizures, focal or lateralizing signs are seen.    Assessment: 76 year old female presenting with status epilepticus. No prior history of seizures. No recent illnesses or other precipitating  events described by her husband. No benzodiazepine use at home and not withdrawing from alcohol.  1. Low magnesium of 1.0 a possible precipitant of her seizures.  2. Total serum calcium is normal with borderline low ionized calcium. PTH is low.  3. Na is normal.  4. Leukocytosis may be secondary to seizure 5. TSH is normal.  6. CT shows no acute cortically based infarct or intracranial hemorrhage. There has been increased chronic nonspecific cerebral volume loss since 2014.   7. History of hypothyroidism noted. 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  8. EEG with diffuse slowing, consistent with a severe diffuse encephalopathy. No electrographic seizures seen.   Recommendations: 1. Continue Keppra at 1000 mg IV BID.  2. Propofol has been stopped. Being started on Precedex for agitation 3. Monitor and correct serum Ca and Mg levels as indicated.  4. MRI brain when able 5. Discussed with the patient's husband who expressed understanding and agreement with the plan 6. Serum antithyroglobulin and anti-thyroid peroxidase antibody titers pending. 7. ESR and c-reactive protein pending  35 minutes spent in the Neurological evaluation and management of this critically ill patient. Time included  discussion with family and coordination of care.    LOS: 1 day   _0  signed: Dr. Kerney Elbe 06/07/2017  11:52 AM

## 2017-06-07 NOTE — Progress Notes (Signed)
ANTICOAGULATION CONSULT NOTE - Follow Up Consult  Pharmacy Consult for Heparin (Xarelto on hold) Indication: Hx PE/DVT  Allergies  Allergen Reactions  . Allopurinol Nausea And Vomiting    Patient Measurements: Height: _0  (154.9 cm) Weight: 160 lb 0.9 oz (72.6 kg) IBW/kg (Calculated) : 47.8   Vital Signs: Temp: 97.4 F (36.3 C) (11/22 0016) Temp Source: Oral (11/21 1756) BP: 93/58 (11/22 0003) Pulse Rate: 66 (11/22 0016)  Labs: Recent Labs    06/06/17 1406 06/06/17 1411 06/06/17 1847 06/06/17 2304  HGB 14.6 16.0*  --   --   HCT 45.5 47.0*  --   --   PLT 323  --   --   --   APTT 24  --   --  84*  LABPROT 13.5  --   --   --   INR 1.04  --   --   --   HEPARINUNFRC  --   --   --  0.92*  CREATININE 0.90 0.70  --   --   TROPONINI  --   --  <0.03  --     Estimated Creatinine Clearance: 54.5 mL/min (by C-G formula based on SCr of 0.7 mg/dL).  Assessment: Heparin while Xarelto on hold, aPTT is therapeutic tonight, using aPTT to dose for now given Xarelto influence on anti-Xa levels  Goal of Therapy:  Heparin level 0.3-0.7 units/ml aPTT 66-102 seconds Monitor platelets by anticoagulation protocol: Yes   Plan -Cont heparin at 1100 units/hr -Heparin level/aPTT with AM labs  Narda Bonds 06/07/2017,12:46 AM

## 2017-06-07 NOTE — Progress Notes (Signed)
Patient's husband brought in advanced directives. Placed in chart and informed oncoming RN.

## 2017-06-07 NOTE — Progress Notes (Signed)
[place] for Heparin Indication: Hx pulmonary embolus  Allergies  Allergen Reactions  . Allopurinol Nausea And Vomiting    Patient Measurements: Height: 5' 1" (154.9 cm) Weight: 166 lb 3.6 oz (75.4 kg) IBW/kg (Calculated) : 47.8 Heparin Dosing Weight:63.3kg  Vital Signs: Temp: 98.1 F (36.7 C) (11/22 0809) Temp Source: Oral (11/22 0809) BP: 118/67 (11/22 0800) Pulse Rate: 69 (11/22 0800)  Labs: Recent Labs    06/06/17 1406 06/06/17 1411 06/06/17 1847 06/06/17 2304 06/07/17 0029 06/07/17 0607  HGB 14.6 16.0*  --   --   --   --   HCT 45.5 47.0*  --   --   --   --   PLT 323  --   --   --   --   --   APTT 24  --   --  84*  --  104*  LABPROT 13.5  --   --   --   --   --   INR 1.04  --   --   --   --   --   HEPARINUNFRC  --   --   --  0.92*  --  0.96*  CREATININE 0.90 0.70  --   --  0.74  --   TROPONINI  --   --  <0.03  --  <0.03  --     Estimated Creatinine Clearance: 55.5 mL/min (by C-G formula based on SCr of 0.74 mg/dL).  Assessment: 76 Yo female patient arrives via EMS with altered mental status and seizures en route to the hospital. She continue on heparin for history of PE while xarelto is on hold. APTT and heparin level are slightly elevated today. No bleeding noted.   Goal of Therapy:  Heparin level 0.3-0.7 APTT 66-102s Monitor platelets by anticoagulation protocol: Yes   Plan:  Reduce heparin gtt slightly to 1000 units/hr Daily heparin level, aPTT and CBC  Salome Arnt, PharmD, BCPS Phone #: [phone] until 3pm All other times, call [place] x [phone] 06/07/2017 8:29 AM

## 2017-06-07 NOTE — Progress Notes (Signed)
[facility] Attending Note  ADMISSION DATE:  06/06/2017  REFERRING MD:  Zenovia Jarred, M.D. / EDP   CHIEF COMPLAINT:  Status Epilepticus   Presenting HPI:  75 y.o. female with history of depression, DVT with pulmonary embolism, OSA, and diabetes mellitus type 2 presenting on 11/21 with altered mentation to the emergency department. Initially patient was called as a code stroke. In route to the emergency department she had a seizure and was given 2.5 mg of Versed. In the emergency department patient was noted to have recurrent seizure activity after returning from brain imaging. She received a total of 6 mg of Ativan and subsequently had altered level of consciousness in her postictal and sedated state. Patient was not protecting airway and was subsequently endotracheally intubated. Patient was admitted to the ICU on continuous EEG monitoring. Has no known history of seizures.  Subjective:  No acute events overnight. Propofol discontinued this morning. No witnessed seizures.   Review of Systems:  Unable to obtain given intubation & sedation.   Vent Mode: PRVC FiO2 (%):  [40 %-100 %] 40 % Set Rate:  [14 bmp] 14 bmp Vt Set:  [380 mL] 380 mL PEEP:  [5 cmH20] 5 cmH20 Plateau Pressure:  [11 cmH20-14 cmH20] 11 cmH20  Temp:  [95.6 F (35.3 C)-98.3 F (36.8 C)] 98.1 F (36.7 C) (11/22 0809) Pulse Rate:  [64-81] 71 (11/22 0900) Resp:  [14-24] 15 (11/22 0900) BP: (86-169)/(54-114) 121/62 (11/22 0900) SpO2:  [96 %-100 %] 99 % (11/22 0900) FiO2 (%):  [40 %-100 %] 40 % (11/22 0827) Weight:  [158 lb (71.7 kg)-166 lb 3.6 oz (75.4 kg)] 166 lb 3.6 oz (75.4 kg) (11/22 0458)   General:  Husband & son at bedside. Intubated. No distress. Integument:  Warm & dry. No rash on exposed skin. HEENT:  Moist mucus memebranes. No scleral icterus. Endotracheal tube in place. Neurological:  Pupils symmetric, dilated & reactive. Not following commands. Pulmonary:  Symmetric chest wall  rise on ventilator. Clear breath sounds bilaterally. Cardiovascular:  Regular rate & rhythm. No appreciable JVD. Normal S1 & S2. Abdomen:  Soft. Nondistended. Normoactive bowel sounds.  LINES/TUBES: OETT 11/21 >>> Foley 11/21 >>> OGT 11/21 >>> PIV  CBC Latest Ref Rng & Units 06/06/2017 06/06/2017 05/30/2017  WBC 4.0 - 10.5 K/uL - 13.1(H) 12.0(H)  Hemoglobin 12.0 - 15.0 g/dL 16.0(H) 14.6 11.5(L)  Hematocrit 36.0 - 46.0 % 47.0(H) 45.5 36.0  Platelets 150 - 400 K/uL - 323 249   BMP Latest Ref Rng & Units 06/07/2017 06/06/2017 06/06/2017  Glucose 65 - 99 mg/dL 167(H) 174(H) 170(H)  BUN 6 - 20 mg/dL _0 Creatinine 0.44 - 1.00 mg/dL 0.74 0.70 0.90  Sodium 135 - 145 mmol/L 136 136 138  Potassium 3.5 - 5.1 mmol/L 4.2 3.6 3.7  Chloride 101 - 111 mmol/L 95(L) 94(L) 94(L)  CO2 22 - 32 mmol/L 31 - 24  Calcium 8.9 - 10.3 mg/dL 8.3(L) - 9.2    Hepatic Function Latest Ref Rng & Units 06/06/2017 05/29/2017 05/28/2017  Total Protein 6.5 - 8.1 g/dL 7.0 5.8(L) 5.8(L)  Albumin 3.5 - 5.0 g/dL 3.7 3.3(L) 3.2(L)  AST 15 - 41 U/L 26 40 56(H)  ALT 14 - 54 U/L 13(L) 18 17  Alk Phosphatase 38 - 126 U/L 88 71 74  Total Bilirubin 0.3 - 1.2 mg/dL 0.6 0.6 1.0    IMAGING/STUDIES: CT HEAD W/O 11/21:  No acute cortically based infarct or intracranial hemorrhage identified. ASPECTS is 10.  Increased chronic nonspecific cerebral volume loss since 2014. 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 PORT CXR 11/22:  Personally reviewed by me. Persistent silhouetting left hemidiaphragm suggestive of opacification versus consolidation. Endotracheal tube in good position. Enteric feeding tube coursing below diaphragm.  MICROBIOLOGY: MRSA PCR 11/21:  Negative   ANTIBIOTICS: None.  SIGNIFICANT EVENTS: 11/21 - Admit with status epilepticus   ASSESSMENT/PLAN:  76 y.o. female presenting in status epilepticus. 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 Patient has no history of seizure  disorder.  1. Status epilepticus: Management per neurology. Continuous EEG monitoring. Continuing on Keppra. 2. Acute hypoxic respiratory failure: Endotracheal protect airway in the setting of altered mental status. Continuing full ventilator support while awaiting neurologic recovery. 3. Acute encephalopathy: Likely secondary to sedation as well as postictal state. Ordering Precedex infusion to be used as needed. Holding propofol. 4. OSA: On home BiPAP therapy. Plan to restart BiPAP therapy after extubation. 5. 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 Continuing heparin drip per pharmacy protocol. 6. Diabetes mellitus type 2: Continuing Accu-Cheks every 4 hours with sliding scale insulin per sensitive algorithm. Holding home metformin. 7. Hypothyroidism: Continuing IV Synthroid daily. 8. Hypotension: Resolved. Secondary to fosphenytoin infusion. 9. Essential hypertension: Monitoring vitals per unit protocol. Holding home antihypertensive regimen. 10. Hypocalcemia: Status post IV calcium gluconate on 11/21. Monitoring electrolytes daily.  Prophylaxis:  Heparin drip per protocol & Protonix IV qhs. Diet:  NPO. Holding on tube feedings anticipating extubation in 24-48 hours. Code Status:  Full Code per previous physician discussion. Disposition:  Remains in ICU while awaiting improvement in mental status. Family Update:  Husband and son updated at bedside.  I have personally spent a total of 31 minutes of critical care time today caring for the patient, updating family at bedside, & reviewing the patient's electronic medical record.  Sonia Baller Ashok Cordia, M.D. [facility] Pager:  [phone] After 7pm or if no response, call [phone] 9:56 AM 06/07/17

## 2017-06-08 ENCOUNTER — Inpatient Hospital Stay (HOSPITAL_COMMUNITY): Payer: Medicare Other

## 2017-06-08 LAB — GLUCOSE, CAPILLARY
GLUCOSE-CAPILLARY: 119 mg/dL — AB (ref 65–99)
GLUCOSE-CAPILLARY: 154 mg/dL — AB (ref 65–99)
Glucose-Capillary: 90 mg/dL (ref 65–99)
Glucose-Capillary: 148 mg/dL — ABNORMAL HIGH (ref 65–99)

## 2017-06-08 LAB — RENAL FUNCTION PANEL
Albumin: 2.5 g/dL — ABNORMAL LOW (ref 3.5–5.0)
Anion gap: 7 (ref 5–15)
BUN: 9 mg/dL (ref 6–20)
CO2: 30 mmol/L (ref 22–32)
Calcium: 7.6 mg/dL — ABNORMAL LOW (ref 8.9–10.3)
Chloride: 102 mmol/L (ref 101–111)
Creatinine, Ser: 0.76 mg/dL (ref 0.44–1.00)
GFR calc Af Amer: >60 mL/min
GFR calc non Af Amer: >60 mL/min
Glucose, Bld: 103 mg/dL — ABNORMAL HIGH (ref 65–99)
Phosphorus: 4.2 mg/dL (ref 2.5–4.6)
Potassium: 3.4 mmol/L — ABNORMAL LOW (ref 3.5–5.1)
Sodium: 139 mmol/L (ref 135–145)

## 2017-06-08 LAB — CBC WITH DIFFERENTIAL/PLATELET
BASOS ABS: 0 10*3/uL (ref 0.0–0.1)
BASOS PCT: 1 %
Eosinophils Absolute: 0.2 10*3/uL (ref 0.0–0.7)
Eosinophils Relative: 2 %
HEMATOCRIT: 31.2 % — AB (ref 36.0–46.0)
HEMOGLOBIN: 10 g/dL — AB (ref 12.0–15.0)
Lymphocytes Relative: 20 %
Lymphs Abs: 1.6 10*3/uL (ref 0.7–4.0)
MCH: 31.3 pg (ref 26.0–34.0)
MCHC: 32.1 g/dL (ref 30.0–36.0)
MCV: 97.8 fL (ref 78.0–100.0)
MONOS PCT: 10 %
Monocytes Absolute: 0.8 10*3/uL (ref 0.1–1.0)
NEUTROS ABS: 5.4 10*3/uL (ref 1.7–7.7)
NEUTROS PCT: 68 %
Platelets: 212 10*3/uL (ref 150–400)
RBC: 3.19 MIL/uL — ABNORMAL LOW (ref 3.87–5.11)
RDW: 13.9 % (ref 11.5–15.5)
WBC: 8 10*3/uL (ref 4.0–10.5)

## 2017-06-08 LAB — HEPARIN LEVEL (UNFRACTIONATED)
HEPARIN UNFRACTIONATED: 0.76 [IU]/mL — AB (ref 0.30–0.70)
HEPARIN UNFRACTIONATED: 0.61 [IU]/mL (ref 0.30–0.70)

## 2017-06-08 LAB — HEMOGLOBIN AND HEMATOCRIT, BLOOD
HEMATOCRIT: 31.2 % — AB (ref 36.0–46.0)
HEMOGLOBIN: 10 g/dL — AB (ref 12.0–15.0)

## 2017-06-08 LAB — MAGNESIUM
MAGNESIUM: 2 mg/dL (ref 1.7–2.4)
Magnesium: 1.5 mg/dL — ABNORMAL LOW (ref 1.7–2.4)

## 2017-06-08 LAB — APTT
APTT: 108 s — AB (ref 24–36)
aPTT: 87 seconds — ABNORMAL HIGH (ref 24–36)

## 2017-06-08 LAB — TYPE AND SCREEN
ABO/RH(D): A POS
Antibody Screen: NEGATIVE

## 2017-06-08 LAB — PHOSPHORUS: PHOSPHORUS: 3.3 mg/dL (ref 2.5–4.6)

## 2017-06-08 LAB — ABO/RH: ABO/RH(D): A POS

## 2017-06-08 MED ORDER — MAGNESIUM SULFATE 4 GM/100ML IV SOLN
4.0000 g | Freq: Once | INTRAVENOUS | Status: AC
  Administered 2017-06-08: 4 g via INTRAVENOUS
  Filled 2017-06-08: qty 100

## 2017-06-08 MED ORDER — PANTOPRAZOLE SODIUM 40 MG IV SOLR
40.0000 mg | Freq: Two times a day (BID) | INTRAVENOUS | Status: DC
  Administered 2017-06-08 – 2017-06-11 (×5): 40 mg via INTRAVENOUS
  Filled 2017-06-08 (×7): qty 40

## 2017-06-08 MED ORDER — POTASSIUM CHLORIDE 10 MEQ/100ML IV SOLN
10.0000 meq | INTRAVENOUS | Status: AC
  Administered 2017-06-08 (×4): 10 meq via INTRAVENOUS
  Filled 2017-06-08 (×4): qty 100

## 2017-06-08 MED ORDER — VITAL HIGH PROTEIN PO LIQD
1000.0000 mL | ORAL | Status: DC
  Administered 2017-06-08 – 2017-06-15 (×8): 1000 mL
  Filled 2017-06-08 (×4): qty 1000

## 2017-06-08 NOTE — Progress Notes (Signed)
[place] for Heparin Indication: Hx pulmonary embolus  Allergies  Allergen Reactions  . Allopurinol Nausea And Vomiting    Patient Measurements: Height: _0  (154.9 cm) Weight: 167 lb 15.9 oz (76.2 kg) IBW/kg (Calculated) : 47.8 Heparin Dosing Weight: 63.3kg  Vital Signs: Temp: 98.5 F (36.9 C) (11/23 1123) Temp Source: Oral (11/23 1123) BP: 112/67 (11/23 1210) Pulse Rate: 72 (11/23 1210)  Labs: Recent Labs    06/06/17 1406 06/06/17 1411 06/06/17 1847  06/07/17 0029 06/07/17 0607 06/08/17 0247 06/08/17 1211  HGB 14.6 16.0*  --   --   --   --  10.0*  --   HCT 45.5 47.0*  --   --   --   --  31.2*  --   PLT 323  --   --   --   --   --  212  --   APTT 24  --   --    < >  --  104* 108* 87*  LABPROT 13.5  --   --   --   --   --   --   --   INR 1.04  --   --   --   --   --   --   --   HEPARINUNFRC  --   --   --    < >  --  0.96* 0.76* 0.61  CREATININE 0.90 0.70  --   --  0.74  --  0.76  --   TROPONINI  --   --  <0.03  --  <0.03  --   --   --    < > = values in this interval not displayed.    Estimated Creatinine Clearance: 55.9 mL/min (by C-G formula based on SCr of 0.76 mg/dL).  Assessment: 76 Yo female patient arrives via EMS with altered mental status and seizures en route to the hospital. She continue on heparin for history of PE while xarelto is on hold. Last dose: 11/20.  APTT and heparin level are now within range after dose decrease and appear to be correlating.   HgB was down slightly this morning but this may be dilutional. Nurse noted some bright red blood in the patient's OG tube this morning but it not reappear after suctioning. Plan to re-check H/H at 5 PM this evening.    Goal of Therapy:  Heparin level 0.3-0.7 APTT 66-102s Monitor platelets by anticoagulation protocol: Yes   Plan:  Continue heparin drip at 900 units/hr 8 hr heparin level and aPTT Daily heparin level, aPTT and CBC   Jimmy Footman, PharmD,  BCPS PGY2 Infectious Diseases Pharmacy Resident  Phone: [place]ette [place] [phone] 06/08/2017 1:02 PM

## 2017-06-08 NOTE — Progress Notes (Signed)
SLP Cancellation Note  Patient Details Name: Amanda Short MRN: [id number] DOB: [birth year]   Cancelled treatment:       Reason Eval/Treat Not Completed: Patient not medically ready. Remains intubated. Will f/u 11/24.   [place], CCC-SLP [phone]    Agustina Witzke Meryl 06/08/2017, 7:00 AM

## 2017-06-08 NOTE — Procedures (Signed)
  Video EEG Monitoring Report    Dates of monitoring:   06/07/17 _0 :30 to 06/08/17 _1 :30  Recording day:    2 (started on 11/21) EEG Number:    [id number] Requesting provider:   Bruce Donath Interpreting physician:  Becky Sax, MD  CPT:  [phone] ICD-10:   R56.9   Present History: 76 year old woman who presented with altered mental status and acute repetitive seizures. Continuous VEEG requested to evaluate for seizures.    EEG Classification  Abnormal, Coma <-> Stupor  There is no PDR  HR  70 bpm and regular    Background abnormalities:   1. Continuous slow, generalized    Periodic, rhythmic or epileptiform abnormalities:   none   Ictal phenomena:  none    EEG DETAILS:  TYPE OF RECORDING: At least 18-channel digital EEG with using a standard international 10-20 placement with additional EEG electrodes, and 1 additional channel dedicated to the EKG, at a sampling rate of at least 256 Hz. Video was recorded throughout the study. The recording was interpreted using digital review software allowing for montage reformatting, gain and filter changes. Each page was reviewed manually. Automatic spike and seizure detection software and quantitative analysis tools were used as needed.   Description of EEG features: The recording reveals a continuous, moderately variable and reactive, symmetric background.  This consists of diffuse medium amplitude slow delta and overriding, waxing and waning faster theta and spindly beta activity, both maximum central-parasagittal.  In response to stimulation there are partial arousals, initially characterized by high amplitude rhythmic delta, then partial attenuation of the slowest activity and emergence of a more continuous theta background.   After ~12:00 arousals lead to more organized background and the emergence of a ~6-7 Hz diffuse background, but the ~1 Hz rhythmic delta is not attenuated.    Push Button  Events: none  Interpretation: This EEG is indicative of a severe diffuse encephalopathy, non-specific as to etiology. Sedating medication effect cannot be excluded.   No epileptiform discharges, seizures, focal or lateralizing signs are seen.   Compared to previous epoch, there is minimal improvement in the degree of encephalopathy.

## 2017-06-08 NOTE — Progress Notes (Signed)
[place] Progress Note Patient Name: Milissa Fesperman DOB: 08/25/1940 MRN: [id number]   Date of Service  06/08/2017  HPI/Events of Note  Hypokalemia and hypomag  eICU Interventions  Potassium and mag replaced     Intervention Category Intermediate Interventions: Electrolyte abnormality - evaluation and management  Zailen Albarran 06/08/2017, 4:31 AM

## 2017-06-08 NOTE — Progress Notes (Signed)
LTM EEG: Checked impedances, reset electrodes as needed. No skin breakdown noted.

## 2017-06-08 NOTE — Progress Notes (Addendum)
[place] [facility] Attending Note  ADMISSION DATE:  06/06/2017  REFERRING MD:  Zenovia Jarred, M.D. / EDP   CHIEF COMPLAINT:  Status Epilepticus   Presenting HPI:  76 y.o. female with history of depression, DVT with pulmonary embolism, OSA, and diabetes mellitus type 2 presenting on 11/21 with altered mentation to the emergency department. Initially patient was called as a code stroke. In route to the emergency department she had a seizure and was given 2.5 mg of Versed. In the emergency department patient was noted to have recurrent seizure activity after returning from brain imaging. She received a total of 6 mg of Ativan and subsequently had altered level of consciousness in her postictal and sedated state. Patient was not protecting airway and was subsequently endotracheally intubated. Patient was admitted to the ICU on continuous EEG monitoring. Has no known history of seizures.  Subjective:  No acute events overnight. Propofol previously discontinued. Still no witnessed seizures. Patient moving more of her extremities this morning. Had some of bloody OG tube output yesterday morning.  Review of Systems:  Unable to obtain given intubation & altered mentation.   Vent Mode: PRVC FiO2 (%):  [40 %] 40 % Set Rate:  [14 bmp] 14 bmp Vt Set:  [380 mL] 380 mL PEEP:  [5 cmH20] 5 cmH20 Plateau Pressure:  [10 cmH20-22 cmH20] 22 cmH20  Temp:  [98.2 F (36.8 C)-99.1 F (37.3 C)] 98.4 F (36.9 C) (11/23 0805) Pulse Rate:  [50-184] 68 (11/23 1000) Resp:  [12-24] 20 (11/23 1000) BP: (85-162)/(53-86) 106/61 (11/23 1000) SpO2:  [92 %-100 %] 96 % (11/23 1000) FiO2 (%):  [40 %] 40 % (11/23 0850) Weight:  [167 lb 15.9 oz (76.2 kg)] 167 lb 15.9 oz (76.2 kg) (11/23 0422)   General: Husband at bedside. No distress. Remains intubated. Integument: Warm. Dry. No rash appreciated. HEENT: No scleral icterus. Endotracheal tube in place. Neurological: Spontaneously moving all 4  extremities. Attempting to open eyes. Not following commands. Pupils dilated, symmetric, and reactive. No Babinski. Cardiovascular: Regular rate. No JVD appreciated. No edema. Pulmonary: Good breath sounds bilaterally. Symmetric chest wall expansion on ventilator. Abdomen: Soft. Nondistended. Normal bowel sounds. Bilious orogastric tube output noted.  LINES/TUBES: OETT 11/21 >>> Foley 11/21 >>> OGT 11/21 >>> PIV  CBC Latest Ref Rng & Units 06/08/2017 06/06/2017 06/06/2017  WBC 4.0 - 10.5 K/uL 8.0 - 13.1(H)  Hemoglobin 12.0 - 15.0 g/dL 10.0(L) 16.0(H) 14.6  Hematocrit 36.0 - 46.0 % 31.2(L) 47.0(H) 45.5  Platelets 150 - 400 K/uL 212 - 323   BMP Latest Ref Rng & Units 06/08/2017 06/07/2017 06/06/2017  Glucose 65 - 99 mg/dL 103(H) 167(H) 174(H)  BUN 6 - 20 mg/dL _0 Creatinine 0.44 - 1.00 mg/dL 0.76 0.74 0.70  Sodium 135 - 145 mmol/L 139 136 136  Potassium 3.5 - 5.1 mmol/L 3.4(L) 4.2 3.6  Chloride 101 - 111 mmol/L 102 95(L) 94(L)  CO2 22 - 32 mmol/L 30 31 -  Calcium 8.9 - 10.3 mg/dL 7.6(L) 8.3(L) -    Hepatic Function Latest Ref Rng & Units 06/08/2017 06/06/2017 05/29/2017  Total Protein 6.5 - 8.1 g/dL - 7.0 5.8(L)  Albumin 3.5 - 5.0 g/dL 2.5(L) 3.7 3.3(L)  AST 15 - 41 U/L - 26 40  ALT 14 - 54 U/L - 13(L) 18  Alk Phosphatase 38 - 126 U/L - 88 71  Total Bilirubin 0.3 - 1.2 mg/dL - 0.6 0.6    IMAGING/STUDIES: CT HEAD W/O 11/21:  No acute cortically  based infarct or intracranial hemorrhage identified. ASPECTS is 10.  Increased chronic nonspecific cerebral volume loss since 2014. 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 PORT CXR 11/22:  Previously reviewed by me. Persistent silhouetting left hemidiaphragm suggestive of opacification versus consolidation. Endotracheal tube in good position. Enteric feeding tube coursing below diaphragm. EEG 11/23: This EEG is indicative of a severe diffuse encephalopathy, non-specific as to etiology. Sedating medication effect  cannot be excluded. No epileptiform discharges, seizures, focal or lateralizing signs are seen. Compared to previous epoch, there is minimal improvement in the degree of encephalopathy.  MICROBIOLOGY: MRSA PCR 11/21:  Negative   ANTIBIOTICS: None.  SIGNIFICANT EVENTS: 11/21 - Admit with status epilepticus  11/22 - Taken off Propofol  ASSESSMENT/PLAN:  76 y.o. female presenting in status epilepticus. History of DVT/PE on systemic anticoagulation. No known history of seizure disorder confirmed again today with husband at bedside. No clear obvious focal deficit on limited physical exam today.  1. Status epilepticus: Management per neurology. Continuing Keppra. Likely will undergo MRI imaging today. 2. Acute hypoxic respiratory failure: Unable to protect airway in the setting of altered mentation. Continuing full ventilator support while awaiting neurologic recovery. 3. Acute encephalopathy: Multifactorial but suspect secondary to postictal state. Likely will undergo MRI imaging today. Attempting to limit sedation. Continuing Precedex infusion. 4. OSA: On home BiPAP therapy. Plan to restart BiPAP therapy after extubation. 5. 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 Continuing heparin drip per pharmacy protocol. 6. Diabetes mellitus type 2: Continuing Accu-Cheks every 4 hours with sliding scale insulin per sensitive algorithm. Holding home metformin. 7. Hypothyroidism: Continuing IV Synthroid daily. 8. Hypotension: Resolved. Secondary to fosphenytoin infusion. 9. Essential hypertension: Currently normotensive. Monitoring vitals per unit protocol. Holding home antihypertensive regimen. 10. Hypocalcemia: Status post IV calcium gluconate on 11/21. Monitoring electrolytes daily. 11. Anemia: Possible gastritis/GI bleed. Repeat hemoglobin/hematocrit at 5 PM today. Ordering type and screen. Increasing Protonix to IV every 12 hours. 12. Hypomagnesemia: Magnesium sulfate 4 g IV ordered. Repeat magnesium  level with a.m. labs. 13. Hypokalemia: Replaced with IV potassium chloride. Repeat electrolytes in a.m.  Prophylaxis:  Heparin drip per protocol & Protonix IV BID. Diet:  NPO. Consulting dietician to start tube feedings. Code Status:  Full Code per previous physician discussion. Disposition:  Remains in ICU while awaiting improvement in mental status. Family Update:  Husband updated at bedside.  I have personally spent a total of 33 minutes of critical care time today caring for the patient, updating husband at bedside, & reviewing the patient's electronic medical record.  Sonia Baller Ashok Cordia, M.D. [facility] Pager:  [phone] After 7pm or if no response, call [phone] 10:09 AM 06/08/17

## 2017-06-08 NOTE — Progress Notes (Addendum)
Subjective: Semipurposeful movements noted by nursing.   Objective: Current vital signs: BP 134/72   Pulse (!) 56   Temp 98.6 F (37 C) (Oral)   Resp (!) 21   Ht _0  (1.549 m)   Wt 76.2 kg (167 lb 15.9 oz)   SpO2 97%   BMI 31.74 kg/m  Vital signs in last 24 hours: Temp:  [98.2 F (36.8 C)-99.1 F (37.3 C)] 98.6 F (37 C) (11/23 1531) Pulse Rate:  [50-104] 56 (11/23 1652) Resp:  [14-24] 21 (11/23 1652) BP: (97-162)/(57-92) 134/72 (11/23 1600) SpO2:  [94 %-100 %] 97 % (11/23 1600) FiO2 (%):  [40 %] 40 % (11/23 1210) Weight:  [76.2 kg (167 lb 15.9 oz)] 76.2 kg (167 lb 15.9 oz) (11/23 0422)  Intake/Output from previous day: 11/22 0701 - 11/23 0700 In: 3425.6 [I.V.:2895.6; IV Piggyback:520] Out: 4580 [Urine:370; Emesis/NG output:1150] Intake/Output this shift: Total I/O In: 1729.7 [I.V.:1136.7; NG/GT:183; IV Piggyback:410] Out: 320 [Urine:270; Emesis/NG output:50] Nutritional status: Diet NPO time specified  Neurologic Exam: On low-dose Precedex Ment: Eyes closed at baseline; open slightly to noxious stimulation. Semipurposeful weak thrashing movements of upper extremities to noxious stimulation. Follows command to squeeze examiners hand and open eyes. R > L movement noted.   CN: Pupils sluggishly reactive and equal. Does not fixate on visual stimuli; blinks and turns head slightly to avoid penlight. Eyes conjugately at midline. No nystagmus.  Motor/Sensory: Semipurposeful weak thrashing movements of upper extremities to noxious stimulation, 3/5 bilaterally, with less brisk response on the left. Withdraws bilateral lower extremities with 3/5 strength bilaterally, with less brisk response on the left. Follows command to squeeze examiners hand and open eyes. Reflexes: No asymmetry noted.   Lab Results: Results for orders placed or performed during the hospital encounter of 06/06/17 (from the past 48 hour(s))  Glucose, capillary     Status: Abnormal   Collection Time: 06/06/17   5:30 PM  Result Value Ref Range   Glucose-Capillary 201 (H) 65 - 99 mg/dL   Comment 1 Capillary Specimen    Comment 2 Notify RN   Glucose, capillary     Status: Abnormal   Collection Time: 06/06/17  5:38 PM  Result Value Ref Range   Glucose-Capillary 198 (H) 65 - 99 mg/dL   Comment 1 Capillary Specimen    Comment 2 Notify RN   MRSA PCR Screening     Status: None   Collection Time: 06/06/17  6:00 PM  Result Value Ref Range   MRSA by PCR NEGATIVE NEGATIVE    Comment:        The GeneXpert MRSA Assay (FDA approved for NASAL specimens only), is one component of a comprehensive MRSA colonization surveillance program. It is not intended to diagnose MRSA infection nor to guide or monitor treatment for MRSA infections.   Procalcitonin     Status: None   Collection Time: 06/06/17  6:47 PM  Result Value Ref Range   Procalcitonin <0.10 ng/mL    Comment:        Interpretation: PCT (Procalcitonin) <= 0.5 ng/mL: Systemic infection (sepsis) is not likely. Local bacterial infection is possible. (NOTE)         ICU PCT Algorithm               Non ICU PCT Algorithm    ----------------------------     ------------------------------         PCT < 0.25 ng/mL  PCT < 0.1 ng/mL     Stopping of antibiotics            Stopping of antibiotics       strongly encouraged.               strongly encouraged.    ----------------------------     ------------------------------       PCT level decrease by               PCT < 0.25 ng/mL       >= 80% from peak PCT       OR PCT 0.25 - 0.5 ng/mL          Stopping of antibiotics                                             encouraged.     Stopping of antibiotics           encouraged.    ----------------------------     ------------------------------       PCT level decrease by              PCT >= 0.25 ng/mL       < 80% from peak PCT        AND PCT >= 0.5 ng/mL            Continuin g antibiotics                                               encouraged.       Continuing antibiotics            encouraged.    ----------------------------     ------------------------------     PCT level increase compared          PCT > 0.5 ng/mL         with peak PCT AND          PCT >= 0.5 ng/mL             Escalation of antibiotics                                          strongly encouraged.      Escalation of antibiotics        strongly encouraged.   Troponin I     Status: None   Collection Time: 06/06/17  6:47 PM  Result Value Ref Range   Troponin I <0.03 <0.03 ng/mL  Magnesium     Status: Abnormal   Collection Time: 06/06/17  6:47 PM  Result Value Ref Range   Magnesium 1.0 (L) 1.7 - 2.4 mg/dL  Phosphorus     Status: Abnormal   Collection Time: 06/06/17  6:47 PM  Result Value Ref Range   Phosphorus 5.0 (H) 2.5 - 4.6 mg/dL  Triglycerides     Status: Abnormal   Collection Time: 06/06/17  6:47 PM  Result Value Ref Range   Triglycerides 192 (H) <150 mg/dL  Glucose, capillary     Status: Abnormal   Collection Time: 06/06/17  7:55 PM  Result Value Ref Range   Glucose-Capillary 172 (H) 65 -  99 mg/dL   Comment 1 Notify RN   Blood gas, arterial     Status: Abnormal   Collection Time: 06/06/17 10:23 PM  Result Value Ref Range   FIO2 40.00    Delivery systems VENTILATOR    Mode PRESSURE REGULATED VOLUME CONTROL    VT 380 mL   LHR 14 resp/min   Peep/cpap 5.0 cm H20   pH, Arterial 7.401 7.350 - 7.450   pCO2 arterial 54.5 (H) 32.0 - 48.0 mmHg   pO2, Arterial 77.3 (L) 83.0 - 108.0 mmHg   Bicarbonate 33.1 (H) 20.0 - 28.0 mmol/L   Acid-Base Excess 8.2 (H) 0.0 - 2.0 mmol/L   O2 Saturation 94.4 %   Patient temperature 98.6    Collection site RIGHT RADIAL    Drawn by [phone]    Sample type ARTERIAL DRAW    Allens test (pass/fail) PASS PASS  Heparin level (unfractionated)     Status: Abnormal   Collection Time: 06/06/17 11:04 PM  Result Value Ref Range   Heparin Unfractionated 0.92 (H) 0.30 - 0.70 IU/mL    Comment:        IF HEPARIN  RESULTS ARE BELOW EXPECTED VALUES, AND PATIENT DOSAGE HAS BEEN CONFIRMED, SUGGEST FOLLOW UP TESTING OF ANTITHROMBIN III LEVELS.   APTT     Status: Abnormal   Collection Time: 06/06/17 11:04 PM  Result Value Ref Range   aPTT 84 (H) 24 - 36 seconds    Comment:        IF BASELINE aPTT IS ELEVATED, SUGGEST PATIENT RISK ASSESSMENT BE USED TO DETERMINE APPROPRIATE ANTICOAGULANT THERAPY.   Glucose, capillary     Status: Abnormal   Collection Time: 06/07/17 12:15 AM  Result Value Ref Range   Glucose-Capillary 161 (H) 65 - 99 mg/dL   Comment 1 Notify RN   Troponin I     Status: None   Collection Time: 06/07/17 12:29 AM  Result Value Ref Range   Troponin I <0.03 <0.03 ng/mL  Basic metabolic panel     Status: Abnormal   Collection Time: 06/07/17 12:29 AM  Result Value Ref Range   Sodium 136 135 - 145 mmol/L   Potassium 4.2 3.5 - 5.1 mmol/L   Chloride 95 (L) 101 - 111 mmol/L   CO2 31 22 - 32 mmol/L   Glucose, Bld 167 (H) 65 - 99 mg/dL   BUN 8 6 - 20 mg/dL   Creatinine, Ser 0.74 0.44 - 1.00 mg/dL   Calcium 8.3 (L) 8.9 - 10.3 mg/dL   GFR calc non Af Amer >60 >60 mL/min   GFR calc Af Amer >60 >60 mL/min    Comment: (NOTE) The eGFR has been calculated using the CKD EPI equation. This calculation has not been validated in all clinical situations. eGFR's persistently <60 mL/min signify possible Chronic Kidney Disease.    Anion gap 10 5 - 15  Magnesium     Status: None   Collection Time: 06/07/17 12:29 AM  Result Value Ref Range   Magnesium 1.7 1.7 - 2.4 mg/dL  Phosphorus     Status: None   Collection Time: 06/07/17 12:29 AM  Result Value Ref Range   Phosphorus 4.6 2.5 - 4.6 mg/dL  Glucose, capillary     Status: Abnormal   Collection Time: 06/07/17  4:29 AM  Result Value Ref Range   Glucose-Capillary 111 (H) 65 - 99 mg/dL   Comment 1 Notify RN   Blood gas, arterial     Status: Abnormal   Collection  Time: 06/07/17  5:48 AM  Result Value Ref Range   FIO2 40.00    Delivery  systems VENTILATOR    Mode PRESSURE REGULATED VOLUME CONTROL    VT 380 mL   LHR 14 resp/min   Peep/cpap 5.0 cm H20   pH, Arterial 7.390 7.350 - 7.450   pCO2 arterial 55.6 (H) 32.0 - 48.0 mmHg   pO2, Arterial 89.8 83.0 - 108.0 mmHg   Bicarbonate 32.9 (H) 20.0 - 28.0 mmol/L   Acid-Base Excess 7.9 (H) 0.0 - 2.0 mmol/L   O2 Saturation 96.0 %   Patient temperature 98.6    Collection site RIGHT RADIAL    Drawn by [phone]    Sample type ARTERIAL DRAW    Allens test (pass/fail) PASS PASS  Heparin level (unfractionated)     Status: Abnormal   Collection Time: 06/07/17  6:07 AM  Result Value Ref Range   Heparin Unfractionated 0.96 (H) 0.30 - 0.70 IU/mL    Comment:        IF HEPARIN RESULTS ARE BELOW EXPECTED VALUES, AND PATIENT DOSAGE HAS BEEN CONFIRMED, SUGGEST FOLLOW UP TESTING OF ANTITHROMBIN III LEVELS.   APTT     Status: Abnormal   Collection Time: 06/07/17  6:07 AM  Result Value Ref Range   aPTT 104 (H) 24 - 36 seconds    Comment:        IF BASELINE aPTT IS ELEVATED, SUGGEST PATIENT RISK ASSESSMENT BE USED TO DETERMINE APPROPRIATE ANTICOAGULANT THERAPY.   Glucose, capillary     Status: Abnormal   Collection Time: 06/07/17  7:55 AM  Result Value Ref Range   Glucose-Capillary 140 (H) 65 - 99 mg/dL  Glucose, capillary     Status: Abnormal   Collection Time: 06/07/17 11:41 AM  Result Value Ref Range   Glucose-Capillary 156 (H) 65 - 99 mg/dL   Comment 1 Capillary Specimen    Comment 2 Notify RN   Sedimentation rate     Status: None   Collection Time: 06/07/17 12:17 PM  Result Value Ref Range   Sed Rate 11 0 - 22 mm/hr  C-reactive protein     Status: Abnormal   Collection Time: 06/07/17 12:17 PM  Result Value Ref Range   CRP 1.8 (H) <1.0 mg/dL  Thyroid peroxidase antibody     Status: None   Collection Time: 06/07/17 12:17 PM  Result Value Ref Range   Thyroperoxidase Ab SerPl-aCnc <6 0 - 34 IU/mL    Comment: (NOTE) Performed At: [facility] [address]  [place], [name] [id number] Rush Farmer MD MH:[id number]   Glucose, capillary     Status: Abnormal   Collection Time: 06/07/17  2:58 PM  Result Value Ref Range   Glucose-Capillary 107 (H) 65 - 99 mg/dL   Comment 1 Capillary Specimen    Comment 2 Notify RN   Glucose, capillary     Status: Abnormal   Collection Time: 06/07/17  8:05 PM  Result Value Ref Range   Glucose-Capillary 139 (H) 65 - 99 mg/dL   Comment 1 Notify RN   Glucose, capillary     Status: Abnormal   Collection Time: 06/07/17 11:37 PM  Result Value Ref Range   Glucose-Capillary 124 (H) 65 - 99 mg/dL   Comment 1 Capillary Specimen   Heparin level (unfractionated)     Status: Abnormal   Collection Time: 06/08/17  2:47 AM  Result Value Ref Range   Heparin Unfractionated 0.76 (H) 0.30 - 0.70 IU/mL    Comment:  IF HEPARIN RESULTS ARE BELOW EXPECTED VALUES, AND PATIENT DOSAGE HAS BEEN CONFIRMED, SUGGEST FOLLOW UP TESTING OF ANTITHROMBIN III LEVELS.   CBC with Differential/Platelet     Status: Abnormal   Collection Time: 06/08/17  2:47 AM  Result Value Ref Range   WBC 8.0 4.0 - 10.5 K/uL   RBC 3.19 (L) 3.87 - 5.11 MIL/uL   Hemoglobin 10.0 (L) 12.0 - 15.0 g/dL    Comment: DELTA CHECK NOTED REPEATED TO VERIFY    HCT 31.2 (L) 36.0 - 46.0 %   MCV 97.8 78.0 - 100.0 fL   MCH 31.3 26.0 - 34.0 pg   MCHC 32.1 30.0 - 36.0 g/dL   RDW 13.9 11.5 - 15.5 %   Platelets 212 150 - 400 K/uL   Neutrophils Relative % 68 %   Neutro Abs 5.4 1.7 - 7.7 K/uL   Lymphocytes Relative 20 %   Lymphs Abs 1.6 0.7 - 4.0 K/uL   Monocytes Relative 10 %   Monocytes Absolute 0.8 0.1 - 1.0 K/uL   Eosinophils Relative 2 %   Eosinophils Absolute 0.2 0.0 - 0.7 K/uL   Basophils Relative 1 %   Basophils Absolute 0.0 0.0 - 0.1 K/uL  Magnesium     Status: Abnormal   Collection Time: 06/08/17  2:47 AM  Result Value Ref Range   Magnesium 1.5 (L) 1.7 - 2.4 mg/dL  APTT     Status: Abnormal   Collection Time: 06/08/17  2:47 AM  Result Value Ref  Range   aPTT 108 (H) 24 - 36 seconds    Comment:        IF BASELINE aPTT IS ELEVATED, SUGGEST PATIENT RISK ASSESSMENT BE USED TO DETERMINE APPROPRIATE ANTICOAGULANT THERAPY.   Renal function panel     Status: Abnormal   Collection Time: 06/08/17  2:47 AM  Result Value Ref Range   Sodium 139 135 - 145 mmol/L   Potassium 3.4 (L) 3.5 - 5.1 mmol/L    Comment: DELTA CHECK NOTED   Chloride 102 101 - 111 mmol/L   CO2 30 22 - 32 mmol/L   Glucose, Bld 103 (H) 65 - 99 mg/dL   BUN 9 6 - 20 mg/dL   Creatinine, Ser 0.76 0.44 - 1.00 mg/dL   Calcium 7.6 (L) 8.9 - 10.3 mg/dL   Phosphorus 4.2 2.5 - 4.6 mg/dL   Albumin 2.5 (L) 3.5 - 5.0 g/dL   GFR calc non Af Amer >60 >60 mL/min   GFR calc Af Amer >60 >60 mL/min    Comment: (NOTE) The eGFR has been calculated using the CKD EPI equation. This calculation has not been validated in all clinical situations. eGFR's persistently <60 mL/min signify possible Chronic Kidney Disease.    Anion gap 7 5 - 15  Glucose, capillary     Status: None   Collection Time: 06/08/17  3:09 AM  Result Value Ref Range   Glucose-Capillary 90 65 - 99 mg/dL   Comment 1 Capillary Specimen   Type and screen [place]     Status: None   Collection Time: 06/08/17 10:35 AM  Result Value Ref Range   ABO/RH(D) A POS    Antibody Screen NEG    Sample Expiration 06/11/2017   ABO/Rh     Status: None   Collection Time: 06/08/17 10:35 AM  Result Value Ref Range   ABO/RH(D) A POS   Glucose, capillary     Status: Abnormal   Collection Time: 06/08/17 11:18 AM  Result Value  Ref Range   Glucose-Capillary 119 (H) 65 - 99 mg/dL   Comment 1 Capillary Specimen    Comment 2 Notify RN   APTT     Status: Abnormal   Collection Time: 06/08/17 12:11 PM  Result Value Ref Range   aPTT 87 (H) 24 - 36 seconds    Comment:        IF BASELINE aPTT IS ELEVATED, SUGGEST PATIENT RISK ASSESSMENT BE USED TO DETERMINE APPROPRIATE ANTICOAGULANT THERAPY.   Heparin level  (unfractionated)     Status: None   Collection Time: 06/08/17 12:11 PM  Result Value Ref Range   Heparin Unfractionated 0.61 0.30 - 0.70 IU/mL    Comment:        IF HEPARIN RESULTS ARE BELOW EXPECTED VALUES, AND PATIENT DOSAGE HAS BEEN CONFIRMED, SUGGEST FOLLOW UP TESTING OF ANTITHROMBIN III LEVELS.   Glucose, capillary     Status: Abnormal   Collection Time: 06/08/17  3:30 PM  Result Value Ref Range   Glucose-Capillary 148 (H) 65 - 99 mg/dL   Comment 1 Capillary Specimen    Comment 2 Notify RN     Recent Results (from the past 240 hour(s))  MRSA PCR Screening     Status: None   Collection Time: 06/06/17  6:00 PM  Result Value Ref Range Status   MRSA by PCR NEGATIVE NEGATIVE Final    Comment:        The GeneXpert MRSA Assay (FDA approved for NASAL specimens only), is one component of a comprehensive MRSA colonization surveillance program. It is not intended to diagnose MRSA infection nor to guide or monitor treatment for MRSA infections.     Lipid Panel Recent Labs    06/06/17 1847  TRIG 192*    Studies/Results: Dg Chest Port 1 View  Result Date: 06/07/2017 CLINICAL DATA:  Endotracheal tube position EXAM: PORTABLE CHEST 1 VIEW COMPARISON:  06/06/2017 FINDINGS: Endotracheal tube in good position.  NG in the stomach. Mild progression of bibasilar atelectasis/ infiltrate. Negative for heart failure. Small left effusion IMPRESSION: Endotracheal tube remains in good position. Progression of bibasilar airspace disease left greater than right Electronically Signed   By: Franchot Gallo M.D.   On: 06/07/2017 06:54   Dg Abd Portable 1v  Result Date: 06/08/2017 CLINICAL DATA:  Repositioning of orogastric tube. EXAM: PORTABLE ABDOMEN - 1 VIEW COMPARISON:  Earlier the same date. FINDINGS: 1208 hours. No significant change in position of the orogastric tube. The tube projects below the diaphragm, tip in the fundal region. The visualized bowel gas pattern is normal. Mildly  increased density at the left lung base and probable left pleural effusion. IMPRESSION: No significant change in position of the orogastric tube, tip overlapping the proximal stomach. Electronically Signed   By: Richardean Sale M.D.   On: 06/08/2017 12:25   Dg Abd Portable 1v  Result Date: 06/08/2017 CLINICAL DATA:  Orogastric tube placement. EXAM: PORTABLE ABDOMEN - 1 VIEW COMPARISON:  Radiographs 06/06/2017.  CT 05/27/2017. FINDINGS: 1126 hours. The orogastric tube is looped over the left upper quadrant of the abdomen, tip likely in the proximal stomach. The visualized bowel gas pattern is normal. Cholecystectomy clips and a convex right lumbar scoliosis are noted. IMPRESSION: Orogastric tube appears looped in the proximal stomach. Electronically Signed   By: Richardean Sale M.D.   On: 06/08/2017 11:38   Dg Abd Portable 1v  Result Date: 06/06/2017 CLINICAL DATA:  Orogastric tube placement. EXAM: PORTABLE ABDOMEN - 1 VIEW COMPARISON:  None. FINDINGS: Patient is  rotated to the right. Orogastric tube is seen with tip overlying the distal stomach. No evidence of dilated bowel loops. Surgical clips from prior cholecystectomy. Lumbar spine degenerative changes and left hip prosthesis noted. IMPRESSION: Orogastric tube tip overlies the distal stomach. Electronically Signed   By: Earle Gell M.D.   On: 06/06/2017 22:08    Medications:  Scheduled: . chlorhexidine gluconate (MEDLINE KIT)  15 mL Mouth Rinse BID  . feeding supplement (VITAL HIGH PROTEIN)  1,000 mL Per Tube Q24H  . insulin aspart  0-9 Units Subcutaneous Q4H  . levothyroxine  50 mcg Intravenous Daily  . mouth rinse  15 mL Mouth Rinse QID  . pantoprazole (PROTONIX) IV  40 mg Intravenous Q12H   Continuous: . sodium chloride    . sodium chloride 100 mL/hr at 06/08/17 1600  . dexmedetomidine (PRECEDEX) IV infusion 1.199 mcg/kg/hr (06/08/17 1600)  . heparin 900 Units/hr (06/08/17 1600)  . levETIRAcetam Stopped (06/08/17 1601)  . propofol  (DIPRIVAN) infusion Stopped (06/07/17 0931)   LTM EEG  06/07/17 _0 :30 to 06/08/17 _1 :30: The recording reveals a continuous, moderately variable and reactive, symmetric background. This consists of diffuse medium amplitude slow delta and overriding, waxing and waning faster theta and spindly beta activity, both maximum central-parasagittal. In response to stimulation there are partial arousals, initially characterized by high amplitude rhythmic delta, then partial attenuation of the slowest activity and emergence of a more continuous theta background. After ~12:00 arousals lead to more organized background and the emergence of a ~6-7 Hz diffuse background, but the ~1 Hz rhythmic delta is not attenuated. Interpretation: This EEG is indicative of a severe diffuse encephalopathy, non-specific as to etiology. Sedating medication effect cannot be excluded. No epileptiform discharges, seizures, focal or lateralizing signs are seen.    Assessment:76 year old female presenting with status epilepticus, now resolved. No prior history of seizures. No recent illnesses or other precipitating events described by her husband. No benzodiazepine use at home and not withdrawing from alcohol. 1.Exam is gradually improving.   2. LTM EEG with diffuse slowing, consistent with a severe diffuse encephalopathy. No electrographic seizures seen.  3. Mg low at 1.5 today. Total Ca low at 7.6. 4. CT showed no acute cortically based infarct or intracranial hemorrhage. There has been increased chronic nonspecific cerebral volume loss since 2014. 5. History of hypothyroidism noted. 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   Recommendations: 1.Continue Keppra at1000 mg IV BID.  2. On Precedex for agitation 3. Monitor and correct serum Ca and Mg levels as indicated.  4. MRI brain when able  35 minutes spent in the  Neurological evaluation and management of this critically ill patient. Time includes coordination of care and EEG review.    LOS: 2 days   _2  signed: Dr. Kerney Elbe 06/08/2017  4:53 PM

## 2017-06-08 NOTE — Progress Notes (Signed)
[place] for Heparin Indication: Hx pulmonary embolus  Allergies  Allergen Reactions  . Allopurinol Nausea And Vomiting    Patient Measurements: Height: 5' 1" (154.9 cm) Weight: 166 lb 3.6 oz (75.4 kg) IBW/kg (Calculated) : 47.8 Heparin Dosing Weight: 63.3kg  Vital Signs: Temp: 98.2 F (36.8 C) (11/23 0315) Temp Source: Oral (11/23 0315) BP: 108/65 (11/23 0400) Pulse Rate: 50 (11/23 0400)  Labs: Recent Labs    06/06/17 1406 06/06/17 1411 06/06/17 1847 06/06/17 2304 06/07/17 0029 06/07/17 0607 06/08/17 0247  HGB 14.6 16.0*  --   --   --   --  10.0*  HCT 45.5 47.0*  --   --   --   --  31.2*  PLT 323  --   --   --   --   --  212  APTT 24  --   --  84*  --  104* 108*  LABPROT 13.5  --   --   --   --   --   --   INR 1.04  --   --   --   --   --   --   HEPARINUNFRC  --   --   --  0.92*  --  0.96* 0.76*  CREATININE 0.90 0.70  --   --  0.74  --  0.76  TROPONINI  --   --  <0.03  --  <0.03  --   --     Estimated Creatinine Clearance: 55.5 mL/min (by C-G formula based on SCr of 0.76 mg/dL).  Assessment: 76 Yo female patient arrives via EMS with altered mental status and seizures en route to the hospital. She continue on heparin for history of PE while xarelto is on hold. APTT and heparin level are slightly elevated today. No bleeding noted, Hgb down to 10 but may be dilutional, platelets are normal.   Goal of Therapy:  Heparin level 0.3-0.7 APTT 66-102s Monitor platelets by anticoagulation protocol: Yes   Plan:  Reduce heparin drip to 900 units/hr 8 hr heparin level and aPTT Daily heparin level, aPTT and CBC   Renold Genta, PharmD, [place] - [phone] 06/08/2017 4:09 AM

## 2017-06-08 NOTE — Progress Notes (Signed)
Initial Nutrition Assessment  DOCUMENTATION CODES:   Obesity unspecified  INTERVENTION:    Vital High Protein at 45 ml/h (1080 ml per day)  Provides 1080 kcal, 95 gm protein, 903 ml free water daily  NUTRITION DIAGNOSIS:   Inadequate oral intake related to inability to eat as evidenced by NPO status.  GOAL:   Provide needs based on ASPEN/SCCM guidelines  MONITOR:   Vent status, TF tolerance, I & O's  REASON FOR ASSESSMENT:   Consult Enteral/tube feeding initiation and management  ASSESSMENT:   76 yo female with PMH of breast CA (2016), HLD, HTN, depression, DVT, pulmonary embolism, OSA, DM-2, who was admitted on 11/21 with status epilepticus. Required intubation on admission.  Discussed patient with MD and RN today. Received MD Consult for TF initiation and management. Patient's husband reports that patient has been eating poorly for the past 3 weeks due to acute nausea and vomiting related to recent diverticulitis. Usual weight ~165 lbs. She has been trying to lose weight over the past several years.  Patient is currently intubated on ventilator support MV: 8.6 L/min Temp (24hrs), Avg:98.5 F (36.9 C), Min:98.2 F (36.8 C), Max:99.1 F (37.3 C)   Labs reviewed. Potassium 3.4 (L), magnesium 1.5 (L) CBG's: 107-139-124-90 Medications reviewed and include Precedex.  NUTRITION - FOCUSED PHYSICAL EXAM:    Most Recent Value  Orbital Region  No depletion  Upper Arm Region  No depletion  Thoracic and Lumbar Region  No depletion  Buccal Region  No depletion  Temple Region  No depletion  Clavicle Bone Region  No depletion  Clavicle and Acromion Bone Region  No depletion  Scapular Bone Region  Unable to assess  Dorsal Hand  No depletion  Patellar Region  No depletion  Anterior Thigh Region  No depletion  Posterior Calf Region  No depletion  Edema (RD Assessment)  None  Hair  Reviewed  Eyes  Unable to assess  Mouth  Unable to assess  Skin  Reviewed  Nails   Reviewed       Diet Order:  Diet NPO time specified  EDUCATION NEEDS:   No education needs have been identified at this time  Skin:  Skin Assessment: Reviewed RN Assessment  Last BM:  PTA  Height:   Ht Readings from Last 1 Encounters:  06/06/17 _0  (1.549 m)    Weight:   Wt Readings from Last 1 Encounters:  06/08/17 167 lb 15.9 oz (76.2 kg)    Ideal Body Weight:  47.7 kg  BMI:  Body mass index is 31.74 kg/m.  Estimated Nutritional Needs:   Kcal:  [phone]  Protein:  95 gm  Fluid:  1.5 L   Molli Barrows, RD, LDN, CNSC Pager [phone] After Hours Pager [phone]

## 2017-06-08 NOTE — Progress Notes (Signed)
LTM EEG discontinued. No skin breakdown noted.

## 2017-06-09 ENCOUNTER — Inpatient Hospital Stay (HOSPITAL_COMMUNITY): Payer: Medicare Other

## 2017-06-09 LAB — GLUCOSE, CAPILLARY
GLUCOSE-CAPILLARY: 123 mg/dL — AB (ref 65–99)
Glucose-Capillary: 117 mg/dL — ABNORMAL HIGH (ref 65–99)
Glucose-Capillary: 134 mg/dL — ABNORMAL HIGH (ref 65–99)
Glucose-Capillary: 140 mg/dL — ABNORMAL HIGH (ref 65–99)
Glucose-Capillary: 163 mg/dL — ABNORMAL HIGH (ref 65–99)
Glucose-Capillary: 165 mg/dL — ABNORMAL HIGH (ref 65–99)
Glucose-Capillary: 199 mg/dL — ABNORMAL HIGH (ref 65–99)
Glucose-Capillary: 97 mg/dL (ref 65–99)

## 2017-06-09 LAB — RENAL FUNCTION PANEL
ALBUMIN: 2.5 g/dL — AB (ref 3.5–5.0)
ANION GAP: 7 (ref 5–15)
BUN: 8 mg/dL (ref 6–20)
CHLORIDE: 104 mmol/L (ref 101–111)
CO2: 26 mmol/L (ref 22–32)
Calcium: 7.6 mg/dL — ABNORMAL LOW (ref 8.9–10.3)
Creatinine, Ser: 0.67 mg/dL (ref 0.44–1.00)
Glucose, Bld: 143 mg/dL — ABNORMAL HIGH (ref 65–99)
PHOSPHORUS: 2.9 mg/dL (ref 2.5–4.6)
POTASSIUM: 3.9 mmol/L (ref 3.5–5.1)
Sodium: 137 mmol/L (ref 135–145)

## 2017-06-09 LAB — CBC WITH DIFFERENTIAL/PLATELET
BASOS ABS: 0 10*3/uL (ref 0.0–0.1)
BASOS PCT: 0 %
Eosinophils Absolute: 0.1 10*3/uL (ref 0.0–0.7)
Eosinophils Relative: 1 %
HEMATOCRIT: 30.3 % — AB (ref 36.0–46.0)
HEMOGLOBIN: 9.7 g/dL — AB (ref 12.0–15.0)
LYMPHS PCT: 8 %
Lymphs Abs: 0.9 10*3/uL (ref 0.7–4.0)
MCH: 31.1 pg (ref 26.0–34.0)
MCHC: 32 g/dL (ref 30.0–36.0)
MCV: 97.1 fL (ref 78.0–100.0)
MONO ABS: 1.1 10*3/uL — AB (ref 0.1–1.0)
Monocytes Relative: 10 %
NEUTROS ABS: 9.4 10*3/uL — AB (ref 1.7–7.7)
NEUTROS PCT: 81 %
Platelets: 187 10*3/uL (ref 150–400)
RBC: 3.12 MIL/uL — AB (ref 3.87–5.11)
RDW: 13.8 % (ref 11.5–15.5)
WBC: 11.6 10*3/uL — AB (ref 4.0–10.5)

## 2017-06-09 LAB — HEPARIN LEVEL (UNFRACTIONATED)
HEPARIN UNFRACTIONATED: 0.16 [IU]/mL — AB (ref 0.30–0.70)
HEPARIN UNFRACTIONATED: 0.24 [IU]/mL — AB (ref 0.30–0.70)

## 2017-06-09 LAB — APTT
aPTT: 30 seconds (ref 24–36)
aPTT: 47 seconds — ABNORMAL HIGH (ref 24–36)
aPTT: 84 seconds — ABNORMAL HIGH (ref 24–36)

## 2017-06-09 LAB — PHOSPHORUS: Phosphorus: 2.9 mg/dL (ref 2.5–4.6)

## 2017-06-09 LAB — MAGNESIUM: MAGNESIUM: 1.8 mg/dL (ref 1.7–2.4)

## 2017-06-09 LAB — TRIGLYCERIDES: TRIGLYCERIDES: 85 mg/dL (ref <150)

## 2017-06-09 MED ORDER — FENTANYL CITRATE (PF) 100 MCG/2ML IJ SOLN
25.0000 ug | INTRAMUSCULAR | Status: DC | PRN
  Administered 2017-06-11 – 2017-06-12 (×5): 50 ug via INTRAVENOUS
  Filled 2017-06-09 (×5): qty 2

## 2017-06-09 MED ORDER — MIDAZOLAM HCL 2 MG/2ML IJ SOLN
1.0000 mg | INTRAMUSCULAR | Status: DC | PRN
  Administered 2017-06-10: 2 mg via INTRAVENOUS
  Filled 2017-06-09 (×2): qty 2

## 2017-06-09 NOTE — Progress Notes (Signed)
vLTM EEG done. No skin breakdown

## 2017-06-09 NOTE — Progress Notes (Signed)
SLP Cancellation Note  Patient Details Name: Erla Bacchi MRN: [id number] DOB: [birth year]   Cancelled treatment:       Reason Eval/Treat Not Completed: Patient not medically ready. Patient remains intubated. SLP signing off. Please re-consult when extubated and appropriate for swallow evaluation. Thank you.   [place], CCC-SLP [phone]    Remell Giaimo Meryl 06/09/2017, 7:17 AM

## 2017-06-09 NOTE — Progress Notes (Signed)
[facility] Attending Note  ADMISSION DATE:  06/06/2017  REFERRING MD:  Zenovia Jarred, M.D. / EDP   CHIEF COMPLAINT:  Status Epilepticus   Presenting HPI:  76 y.o. female with history of depression, DVT with pulmonary embolism, OSA, and diabetes mellitus type 2 presenting on 11/21 with altered mentation to the emergency department. Initially patient was called as a code stroke. In route to the emergency department she had a seizure and was given 2.5 mg of Versed. In the emergency department patient was noted to have recurrent seizure activity after returning from brain imaging. She received a total of 6 mg of Ativan and subsequently had altered level of consciousness in her postictal and sedated state. Patient was not protecting airway and was subsequently endotracheally intubated. Patient was admitted to the ICU on continuous EEG monitoring. Has no known history of seizures.  Subjective:  Patient more awake this morning and following commands more consistently. Plan for MRI today.  Review of Systems:  Unable to obtain given intubation & sedation.   Vent Mode: PSV;CPAP FiO2 (%):  [40 %] 40 % Set Rate:  [14 bmp] 14 bmp Vt Set:  [380 mL] 380 mL PEEP:  [5 cmH20] 5 cmH20 Pressure Support:  [5 LTR32-0 cmH20] 5 cmH20 Plateau Pressure:  [14 cmH20-18 cmH20] 17 cmH20  Temp:  [98.1 F (36.7 C)-99.4 F (37.4 C)] 99.4 F (37.4 C) (11/24 1157) Pulse Rate:  [55-120] 83 (11/24 1139) Resp:  [16-30] 21 (11/24 1139) BP: (117-168)/(59-101) 142/71 (11/24 1139) SpO2:  [93 %-100 %] 97 % (11/24 1139) FiO2 (%):  [40 %] 40 % (11/24 1139) Weight:  [178 lb 12.7 oz (81.1 kg)] 178 lb 12.7 oz (81.1 kg) (11/24 0451)   Gen.: No family at bedside. Comfortable. Nurse at bedside. Integument: Warm. Dry. No rash. HEENT: Endotracheal tube in place. No scleral icterus. Pulmonary: Clear bilaterally to auscultation. Normal work of breathing. Abdomen: Soft. Nondistended. Normal bowel  sounds. Cardiovascular: Regular rate. No JVD appreciated. No edema. Neurological: Intermittently following commands. Attends to voice. Opens eyes to voice.  LINES/TUBES: OETT 11/21 >>> Foley 11/21 >>> OGT 11/21 >>> PIV  CBC Latest Ref Rng & Units 06/09/2017 06/08/2017 06/08/2017  WBC 4.0 - 10.5 K/uL 11.6(H) - 8.0  Hemoglobin 12.0 - 15.0 g/dL 9.7(L) 10.0(L) 10.0(L)  Hematocrit 36.0 - 46.0 % 30.3(L) 31.2(L) 31.2(L)  Platelets 150 - 400 K/uL 187 - 212   BMP Latest Ref Rng & Units 06/09/2017 06/08/2017 06/07/2017  Glucose 65 - 99 mg/dL 143(H) 103(H) 167(H)  BUN 6 - 20 mg/dL _0 Creatinine 0.44 - 1.00 mg/dL 0.67 0.76 0.74  Sodium 135 - 145 mmol/L 137 139 136  Potassium 3.5 - 5.1 mmol/L 3.9 3.4(L) 4.2  Chloride 101 - 111 mmol/L 104 102 95(L)  CO2 22 - 32 mmol/L _1 Calcium 8.9 - 10.3 mg/dL 7.6(L) 7.6(L) 8.3(L)    Hepatic Function Latest Ref Rng & Units 06/09/2017 06/08/2017 06/06/2017  Total Protein 6.5 - 8.1 g/dL - - 7.0  Albumin 3.5 - 5.0 g/dL 2.5(L) 2.5(L) 3.7  AST 15 - 41 U/L - - 26  ALT 14 - 54 U/L - - 13(L)  Alk Phosphatase 38 - 126 U/L - - 88  Total Bilirubin 0.3 - 1.2 mg/dL - - 0.6    IMAGING/STUDIES: CT HEAD W/O 11/21:  No acute cortically based infarct or intracranial hemorrhage identified. ASPECTS is 10.  Increased chronic nonspecific cerebral volume loss since 2014. Stable mild to moderate nonspecific  white matter disease. PORT CXR 11/22:  Previously reviewed by me. Persistent silhouetting left hemidiaphragm suggestive of opacification versus consolidation. Endotracheal tube in good position. Enteric feeding tube coursing below diaphragm. EEG 11/23: This EEG is indicative of a severe diffuse encephalopathy, non-specific as to etiology. Sedating medication effect cannot be excluded. No epileptiform discharges, seizures, focal or lateralizing signs are seen. Compared to previous epoch, there is minimal improvement in the degree of encephalopathy. EEG 11/24:  This  EEG is indicative of a variable diffuse encephalopathy, alternating between mild and awake to severe and comatose. Sedating medication effect is strongly suspected as the main driver. No epileptiform discharges, seizures, focal or lateralizing signs are seen.  MRI 11/24 >>>  MICROBIOLOGY: MRSA PCR 11/21:  Negative   ANTIBIOTICS: None.  SIGNIFICANT EVENTS: 11/21 - Admit with status epilepticus  11/22 - Taken off Propofol 11/24 - Following commands more consistently. Continuous EEG removed & MRI pending  ASSESSMENT/PLAN:  76 y.o. female admitted with status epilepticus. 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 Neurological status steadily improving. Awaiting MRI result.  1. Status epilepticus: New onset seizure disorder. Management per neurology. Continuing Keppra. Plan for MRI today. 2. Acute hypoxic respiratory failure: Continuing pressure support weaning. Holding her next ablation pending MRI. Continuous pulse oximetry monitoring. 3. Acute encephalopathy: Likely postictal state. Continuing to use only intermittent sedation. Continuing Precedex drip.  4. OSA: On home BiPAP therapy. Plan to restart BiPAP therapy after extubation. 5. 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 Continuing heparin drip per pharmacy protocol. 6. Diabetes mellitus type 2: Continuing Accu-Cheks every 4 hours with sliding scale insulin per sensitive algorithm. Holding home metformin. 7. Hypothyroidism: Continuing IV Synthroid daily. 8. Hypotension: Resolved. Secondary to fosphenytoin infusion. 9. Essential hypertension: Currently normotensive. Monitoring vitals per unit protocol. Holding home Toprol-XL and Aldactone.  10. Hypocalcemia: Mild electrolytes daily. 11. Anemia: Possible gastritis/GI bleed. Hemoglobin stable. Trending daily with CBC. Continuing Protonix IV every 12 hours. 12. Hypomagnesemia: Resolved. 13. Hypokalemia: Resolved.  Prophylaxis:  Heparin drip per protocol & Protonix IV BID. Diet:   NPO. Tube feedings as per dietary consult. Code Status:  Full Code per previous physician discussion. Disposition:  Remains in ICU while awaiting improvement in mental status and extubation. Family Update:  No family at bedside during rounds.   I have personally spent a total of 32 minutes of critical care time today caring for the patient & reviewing the patient's electronic medical record.  Sonia Baller Ashok Cordia, M.D. [facility] Pager:  [phone] After 7pm or if no response, call [phone] 12:32 PM 06/09/17

## 2017-06-09 NOTE — Progress Notes (Signed)
Transported patient to MRI while on the ventilator. Patient remained stable while being transported.

## 2017-06-09 NOTE — Procedures (Signed)
Pt placed back on full support due to increased WOB, inc RR.  Pt tolerating full support well, RT will monitor

## 2017-06-09 NOTE — Progress Notes (Signed)
Subjective: Continues to improve. Attempting to mouth words with ET tube in place.   Objective: Current vital signs: BP (!) 142/71   Pulse 83   Temp 99.4 F (37.4 C) (Oral)   Resp (!) 21   Ht 5' 1" (1.549 m)   Wt 81.1 kg (178 lb 12.7 oz)   SpO2 97%   BMI 33.78 kg/m  Vital signs in last 24 hours: Temp:  [98.1 F (36.7 C)-99.4 F (37.4 C)] 99.4 F (37.4 C) (11/24 1157) Pulse Rate:  [55-120] 83 (11/24 1139) Resp:  [16-30] 21 (11/24 1139) BP: (117-168)/(59-101) 142/71 (11/24 1139) SpO2:  [93 %-100 %] 97 % (11/24 1139) FiO2 (%):  [40 %] 40 % (11/24 1139) Weight:  [81.1 kg (178 lb 12.7 oz)] 81.1 kg (178 lb 12.7 oz) (11/24 0451)  Intake/Output from previous day: 11/23 0701 - 11/24 0700 In: 2666.1 [I.V.:1623.1; NG/GT:633; IV Piggyback:410] Out: 1007 [HQRFX:5883; Emesis/NG output:50] Intake/Output this shift: Total I/O In: 292.6 [I.V.:112.6; NG/GT:180] Out: 225 [Urine:225] Nutritional status: Diet NPO time specified  Neurologic Exam: Ment: Eyes closed at baseline; open briskly to voice. Attempts to ask questions by mouthing words over the ET tube, but not comprehensible. Moves all 4 extremities equally without delay to commands. Not able to follow more than simple motor commands.    CN: Pupils reactive and equal 3 mm >> 2 mm. Briefly fixates on visual stimuli; blinks to avoid penlight. Eyes conjugately at midline. No nystagmus.  Motor/Sensory: Moves all 4 extremities equally without delay to commands. Also noted are semipurposeful weak thrashing movements of upper extremities to noxious stimulation, 3/5 bilaterally, without asymmetry. Withdraws bilateral lower extremities with 3/5 strength bilaterally.  Reflexes: No asymmetry noted.   Lab Results: Results for orders placed or performed during the hospital encounter of 06/06/17 (from the past 48 hour(s))  Glucose, capillary     Status: Abnormal   Collection Time: 06/07/17  2:58 PM  Result Value Ref Range   Glucose-Capillary 107  (H) 65 - 99 mg/dL   Comment 1 Capillary Specimen    Comment 2 Notify RN   Glucose, capillary     Status: Abnormal   Collection Time: 06/07/17  8:05 PM  Result Value Ref Range   Glucose-Capillary 139 (H) 65 - 99 mg/dL   Comment 1 Notify RN   Glucose, capillary     Status: Abnormal   Collection Time: 06/07/17 11:37 PM  Result Value Ref Range   Glucose-Capillary 124 (H) 65 - 99 mg/dL   Comment 1 Capillary Specimen   Heparin level (unfractionated)     Status: Abnormal   Collection Time: 06/08/17  2:47 AM  Result Value Ref Range   Heparin Unfractionated 0.76 (H) 0.30 - 0.70 IU/mL    Comment:        IF HEPARIN RESULTS ARE BELOW EXPECTED VALUES, AND PATIENT DOSAGE HAS BEEN CONFIRMED, SUGGEST FOLLOW UP TESTING OF ANTITHROMBIN III LEVELS.   CBC with Differential/Platelet     Status: Abnormal   Collection Time: 06/08/17  2:47 AM  Result Value Ref Range   WBC 8.0 4.0 - 10.5 K/uL   RBC 3.19 (L) 3.87 - 5.11 MIL/uL   Hemoglobin 10.0 (L) 12.0 - 15.0 g/dL    Comment: DELTA CHECK NOTED REPEATED TO VERIFY    HCT 31.2 (L) 36.0 - 46.0 %   MCV 97.8 78.0 - 100.0 fL   MCH 31.3 26.0 - 34.0 pg   MCHC 32.1 30.0 - 36.0 g/dL   RDW 13.9 11.5 - 15.5 %  Platelets 212 150 - 400 K/uL   Neutrophils Relative % 68 %   Neutro Abs 5.4 1.7 - 7.7 K/uL   Lymphocytes Relative 20 %   Lymphs Abs 1.6 0.7 - 4.0 K/uL   Monocytes Relative 10 %   Monocytes Absolute 0.8 0.1 - 1.0 K/uL   Eosinophils Relative 2 %   Eosinophils Absolute 0.2 0.0 - 0.7 K/uL   Basophils Relative 1 %   Basophils Absolute 0.0 0.0 - 0.1 K/uL  Magnesium     Status: Abnormal   Collection Time: 06/08/17  2:47 AM  Result Value Ref Range   Magnesium 1.5 (L) 1.7 - 2.4 mg/dL  APTT     Status: Abnormal   Collection Time: 06/08/17  2:47 AM  Result Value Ref Range   aPTT 108 (H) 24 - 36 seconds    Comment:        IF BASELINE aPTT IS ELEVATED, SUGGEST PATIENT RISK ASSESSMENT BE USED TO DETERMINE APPROPRIATE ANTICOAGULANT THERAPY.   Renal  function panel     Status: Abnormal   Collection Time: 06/08/17  2:47 AM  Result Value Ref Range   Sodium 139 135 - 145 mmol/L   Potassium 3.4 (L) 3.5 - 5.1 mmol/L    Comment: DELTA CHECK NOTED   Chloride 102 101 - 111 mmol/L   CO2 30 22 - 32 mmol/L   Glucose, Bld 103 (H) 65 - 99 mg/dL   BUN 9 6 - 20 mg/dL   Creatinine, Ser 0.76 0.44 - 1.00 mg/dL   Calcium 7.6 (L) 8.9 - 10.3 mg/dL   Phosphorus 4.2 2.5 - 4.6 mg/dL   Albumin 2.5 (L) 3.5 - 5.0 g/dL   GFR calc non Af Amer >60 >60 mL/min   GFR calc Af Amer >60 >60 mL/min    Comment: (NOTE) The eGFR has been calculated using the CKD EPI equation. This calculation has not been validated in all clinical situations. eGFR's persistently <60 mL/min signify possible Chronic Kidney Disease.    Anion gap 7 5 - 15  Glucose, capillary     Status: None   Collection Time: 06/08/17  3:09 AM  Result Value Ref Range   Glucose-Capillary 90 65 - 99 mg/dL   Comment 1 Capillary Specimen   Glucose, capillary     Status: Abnormal   Collection Time: 06/08/17  8:02 AM  Result Value Ref Range   Glucose-Capillary 123 (H) 65 - 99 mg/dL   Comment 1 Capillary Specimen    Comment 2 Notify RN   Type and screen [place]     Status: None   Collection Time: 06/08/17 10:35 AM  Result Value Ref Range   ABO/RH(D) A POS    Antibody Screen NEG    Sample Expiration 06/11/2017   ABO/Rh     Status: None   Collection Time: 06/08/17 10:35 AM  Result Value Ref Range   ABO/RH(D) A POS   Glucose, capillary     Status: Abnormal   Collection Time: 06/08/17 11:18 AM  Result Value Ref Range   Glucose-Capillary 119 (H) 65 - 99 mg/dL   Comment 1 Capillary Specimen    Comment 2 Notify RN   APTT     Status: Abnormal   Collection Time: 06/08/17 12:11 PM  Result Value Ref Range   aPTT 87 (H) 24 - 36 seconds    Comment:        IF BASELINE aPTT IS ELEVATED, SUGGEST PATIENT RISK ASSESSMENT BE USED TO DETERMINE APPROPRIATE ANTICOAGULANT  THERAPY.    Heparin level (unfractionated)     Status: None   Collection Time: 06/08/17 12:11 PM  Result Value Ref Range   Heparin Unfractionated 0.61 0.30 - 0.70 IU/mL    Comment:        IF HEPARIN RESULTS ARE BELOW EXPECTED VALUES, AND PATIENT DOSAGE HAS BEEN CONFIRMED, SUGGEST FOLLOW UP TESTING OF ANTITHROMBIN III LEVELS.   Glucose, capillary     Status: Abnormal   Collection Time: 06/08/17  3:30 PM  Result Value Ref Range   Glucose-Capillary 148 (H) 65 - 99 mg/dL   Comment 1 Capillary Specimen    Comment 2 Notify RN   Hemoglobin and hematocrit, blood     Status: Abnormal   Collection Time: 06/08/17  6:37 PM  Result Value Ref Range   Hemoglobin 10.0 (L) 12.0 - 15.0 g/dL   HCT 31.2 (L) 36.0 - 46.0 %  Magnesium     Status: None   Collection Time: 06/08/17  6:37 PM  Result Value Ref Range   Magnesium 2.0 1.7 - 2.4 mg/dL  Phosphorus     Status: None   Collection Time: 06/08/17  6:37 PM  Result Value Ref Range   Phosphorus 3.3 2.5 - 4.6 mg/dL  Glucose, capillary     Status: Abnormal   Collection Time: 06/08/17  7:48 PM  Result Value Ref Range   Glucose-Capillary 165 (H) 65 - 99 mg/dL   Comment 1 Notify RN   Glucose, capillary     Status: Abnormal   Collection Time: 06/08/17 11:48 PM  Result Value Ref Range   Glucose-Capillary 154 (H) 65 - 99 mg/dL   Comment 1 Notify RN   Glucose, capillary     Status: Abnormal   Collection Time: 06/09/17  3:50 AM  Result Value Ref Range   Glucose-Capillary 117 (H) 65 - 99 mg/dL  Heparin level (unfractionated)     Status: Abnormal   Collection Time: 06/09/17  6:28 AM  Result Value Ref Range   Heparin Unfractionated <0.10 (L) 0.30 - 0.70 IU/mL    Comment:        IF HEPARIN RESULTS ARE BELOW EXPECTED VALUES, AND PATIENT DOSAGE HAS BEEN CONFIRMED, SUGGEST FOLLOW UP TESTING OF ANTITHROMBIN III LEVELS.   APTT     Status: None   Collection Time: 06/09/17  6:28 AM  Result Value Ref Range   aPTT 30 24 - 36 seconds  Renal function panel      Status: Abnormal   Collection Time: 06/09/17  6:28 AM  Result Value Ref Range   Sodium 137 135 - 145 mmol/L   Potassium 3.9 3.5 - 5.1 mmol/L   Chloride 104 101 - 111 mmol/L   CO2 26 22 - 32 mmol/L   Glucose, Bld 143 (H) 65 - 99 mg/dL   BUN 8 6 - 20 mg/dL   Creatinine, Ser 0.67 0.44 - 1.00 mg/dL   Calcium 7.6 (L) 8.9 - 10.3 mg/dL   Phosphorus 2.9 2.5 - 4.6 mg/dL   Albumin 2.5 (L) 3.5 - 5.0 g/dL   GFR calc non Af Amer >60 >60 mL/min   GFR calc Af Amer >60 >60 mL/min    Comment: (NOTE) The eGFR has been calculated using the CKD EPI equation. This calculation has not been validated in all clinical situations. eGFR's persistently <60 mL/min signify possible Chronic Kidney Disease.    Anion gap 7 5 - 15  Magnesium     Status: None   Collection Time: 06/09/17  6:28 AM  Result Value Ref Range   Magnesium 1.8 1.7 - 2.4 mg/dL  CBC with Differential/Platelet     Status: Abnormal   Collection Time: 06/09/17  6:28 AM  Result Value Ref Range   WBC 11.6 (H) 4.0 - 10.5 K/uL   RBC 3.12 (L) 3.87 - 5.11 MIL/uL   Hemoglobin 9.7 (L) 12.0 - 15.0 g/dL   HCT 30.3 (L) 36.0 - 46.0 %   MCV 97.1 78.0 - 100.0 fL   MCH 31.1 26.0 - 34.0 pg   MCHC 32.0 30.0 - 36.0 g/dL   RDW 13.8 11.5 - 15.5 %   Platelets 187 150 - 400 K/uL   Neutrophils Relative % 81 %   Neutro Abs 9.4 (H) 1.7 - 7.7 K/uL   Lymphocytes Relative 8 %   Lymphs Abs 0.9 0.7 - 4.0 K/uL   Monocytes Relative 10 %   Monocytes Absolute 1.1 (H) 0.1 - 1.0 K/uL   Eosinophils Relative 1 %   Eosinophils Absolute 0.1 0.0 - 0.7 K/uL   Basophils Relative 0 %   Basophils Absolute 0.0 0.0 - 0.1 K/uL  Phosphorus     Status: None   Collection Time: 06/09/17  6:28 AM  Result Value Ref Range   Phosphorus 2.9 2.5 - 4.6 mg/dL  Glucose, capillary     Status: Abnormal   Collection Time: 06/09/17  8:16 AM  Result Value Ref Range   Glucose-Capillary 163 (H) 65 - 99 mg/dL   Comment 1 Capillary Specimen    Comment 2 Notify RN   APTT     Status: Abnormal    Collection Time: 06/09/17  8:44 AM  Result Value Ref Range   aPTT 47 (H) 24 - 36 seconds    Comment:        IF BASELINE aPTT IS ELEVATED, SUGGEST PATIENT RISK ASSESSMENT BE USED TO DETERMINE APPROPRIATE ANTICOAGULANT THERAPY.   Heparin level (unfractionated)     Status: Abnormal   Collection Time: 06/09/17  8:44 AM  Result Value Ref Range   Heparin Unfractionated 0.16 (L) 0.30 - 0.70 IU/mL    Comment:        IF HEPARIN RESULTS ARE BELOW EXPECTED VALUES, AND PATIENT DOSAGE HAS BEEN CONFIRMED, SUGGEST FOLLOW UP TESTING OF ANTITHROMBIN III LEVELS.   Glucose, capillary     Status: Abnormal   Collection Time: 06/09/17 11:58 AM  Result Value Ref Range   Glucose-Capillary 199 (H) 65 - 99 mg/dL   Comment 1 Capillary Specimen    Comment 2 Notify RN     Recent Results (from the past 240 hour(s))  MRSA PCR Screening     Status: None   Collection Time: 06/06/17  6:00 PM  Result Value Ref Range Status   MRSA by PCR NEGATIVE NEGATIVE Final    Comment:        The GeneXpert MRSA Assay (FDA approved for NASAL specimens only), is one component of a comprehensive MRSA colonization surveillance program. It is not intended to diagnose MRSA infection nor to guide or monitor treatment for MRSA infections.     Lipid Panel Recent Labs    06/06/17 1847  TRIG 192*    Studies/Results: Dg Abd Portable 1v  Result Date: 06/08/2017 CLINICAL DATA:  Repositioning of orogastric tube. EXAM: PORTABLE ABDOMEN - 1 VIEW COMPARISON:  Earlier the same date. FINDINGS: 1208 hours. No significant change in position of the orogastric tube. The tube projects below the diaphragm, tip in the fundal region. The visualized bowel gas pattern is normal. Mildly  increased density at the left lung base and probable left pleural effusion. IMPRESSION: No significant change in position of the orogastric tube, tip overlapping the proximal stomach. Electronically Signed   By: Richardean Sale M.D.   On: 06/08/2017  12:25   Dg Abd Portable 1v  Result Date: 06/08/2017 CLINICAL DATA:  Orogastric tube placement. EXAM: PORTABLE ABDOMEN - 1 VIEW COMPARISON:  Radiographs 06/06/2017.  CT 05/27/2017. FINDINGS: 1126 hours. The orogastric tube is looped over the left upper quadrant of the abdomen, tip likely in the proximal stomach. The visualized bowel gas pattern is normal. Cholecystectomy clips and a convex right lumbar scoliosis are noted. IMPRESSION: Orogastric tube appears looped in the proximal stomach. Electronically Signed   By: Richardean Sale M.D.   On: 06/08/2017 11:38    Medications:  Scheduled: . chlorhexidine gluconate (MEDLINE KIT)  15 mL Mouth Rinse BID  . feeding supplement (VITAL HIGH PROTEIN)  1,000 mL Per Tube Q24H  . insulin aspart  0-9 Units Subcutaneous Q4H  . levothyroxine  50 mcg Intravenous Daily  . mouth rinse  15 mL Mouth Rinse QID  . pantoprazole (PROTONIX) IV  40 mg Intravenous Q12H   Continuous: . sodium chloride    . sodium chloride 100 mL/hr at 06/09/17 1245  . dexmedetomidine (PRECEDEX) IV infusion 1.003 mcg/kg/hr (06/09/17 1116)  . heparin 1,000 Units/hr (06/09/17 1119)  . levETIRAcetam 1,000 mg (06/09/17 1522)  . propofol (DIPRIVAN) infusion Stopped (06/07/17 0931)   YQM:VHQION chloride, bisacodyl, docusate, fentaNYL (SUBLIMAZE) injection, midazolam  LTM EEG 06/08/17 _0 :30 to 06/09/17 _1 :30: The recording opens with a continuous, moderately variable and reactive, symmetric background. This consists of diffuse medium amplitude slow delta and overriding, waxing and waning faster theta and spindly beta activity, both maximum central-parasagittal. After ~08:00 prolonged arousals with the development of a well modulated 7 Hz PDR are seen and persistence of diffuse polymorphic slowing, essentially wakefulness. During arousals, the patient is obviously extremely agitated and purposeful movement is seen on the video. The arousals alternate with long periods of diffuse continuous  slow, comatose patterns suggesting re-sedation. Interpretation: This EEG is indicative of a variable diffuse encephalopathy, alternating between mild and awake to severe and comatose. Sedating medication effect is strongly suspected as the main driver. No epileptiform discharges, seizures, focal or lateralizing signs are seen.   Assessment:76 year old female presenting with status epilepticus, now resolved. No prior history of seizures. No recent illnesses or other precipitating events described by her husband. No benzodiazepine use at home and not withdrawing from alcohol. 1.Exam continues to improve. Attends to examiner and follows commands more briskly than yesterday. CT showed no acute cortically based infarct or intracranial hemorrhage. There has been increased chronic nonspecific cerebral volume loss since 2014. 2. Most recent LTM EEG tracings show improvement, correlating with improvement on clinical exam. There are no epileptiform discharges, but the EEG is still indicative of a variable diffuse encephalopathy, alternating between mild and awake to severe and comatose. Of note, sedating medication effect is strongly suspected as the main driver.  3. Mg normalized today. Total Ca still low at 7.6.  Recommendations: 1.ContinueKeppraat1000 mg IV BID.  2.On Precedex for agitation. Attempt to wean off sedation.  3.Continue to attempt to wean off ventilator. This afternoon, she was placed back on full support due to increased WOB, inc RR. 4. MRI brain when able 5. EEG has been discontinued 6. Monitor and correct electrolytes as indicated.    LOS: 3 days   _2  signed: Dr. Kerney Elbe 06/09/2017  12:45 PM

## 2017-06-09 NOTE — Progress Notes (Signed)
[facility] Progress Note Patient Name: Amanda Short DOB: 02/11/1941 MRN: [id number]   Date of Service  06/09/2017  HPI/Events of Note    eICU Interventions  Restraints renewed     Intervention Category Intermediate Interventions: Other:  Collene Gobble 06/09/2017, 9:43 PM

## 2017-06-09 NOTE — Progress Notes (Signed)
[place] for Heparin Indication: Hx pulmonary embolus  Allergies  Allergen Reactions  . Allopurinol Nausea And Vomiting    Patient Measurements: Height: 5' 1" (154.9 cm) Weight: 178 lb 12.7 oz (81.1 kg) IBW/kg (Calculated) : 47.8 Heparin Dosing Weight: 63.3kg  Vital Signs: Temp: 98.2 F (36.8 C) (11/24 1955) Temp Source: Oral (11/24 1955) BP: 135/79 (11/24 2000) Pulse Rate: 85 (11/24 2000)  Labs: Recent Labs    06/07/17 0029  06/08/17 0247  06/08/17 1837 06/09/17 0628 06/09/17 0844 06/09/17 1953  HGB  --    < > 10.0*  --  10.0* 9.7*  --   --   HCT  --   --  31.2*  --  31.2* 30.3*  --   --   PLT  --   --  212  --   --  187  --   --   APTT  --    < > 108*   < >  --  30 47* 84*  HEPARINUNFRC  --    < > 0.76*   < >  --  <0.10* 0.16* 0.24*  CREATININE 0.74  --  0.76  --   --  0.67  --   --   TROPONINI <0.03  --   --   --   --   --   --   --    < > = values in this interval not displayed.    Estimated Creatinine Clearance: 57.7 mL/min (by C-G formula based on SCr of 0.67 mg/dL).  Assessment: 76 Yo female patient arrives via EMS with altered mental status and seizures en route to the hospital. She continues on heparin for history of PE while xarelto is on hold. Last dose of Xarelto: 11/20.  Heparin level remains subtherapeutic at 0.24 after rate increase to 1000 units/hr. CBC is stable and no signs of bleeding noted. Heparin level and aPTT correlating so will dose based on heparin level alone.   Goal of Therapy:  Heparin level 0.3-0.7 Monitor platelets by anticoagulation protocol: Yes   Plan:  Increase heparin drip to 1100 units/hr Re-check heparin level with AM labs  Daily heparin level and CBC  Jimmy Footman, PharmD, BCPS PGY2 Infectious Diseases Pharmacy Resident  Phone: Rhunette Croft [phone] 06/09/2017 8:34 PM

## 2017-06-09 NOTE — Procedures (Signed)
  Video EEG Monitoring Report    Dates of monitoring:   06/08/17 _0 :30 to 06/09/17 _1 :30  Recording day:    3 (started on 11/21) EEG Number:    [id number] Requesting provider:   Bruce Donath Interpreting physician:  Becky Sax, MD  CPT:  [phone] ICD-10:   R56.9   Present History: 76 year old woman who presented with altered mental status and acute repetitive seizures. Continuous VEEG requested to evaluate for seizures.    EEG Classification  Abnormal, Coma -> Awake  PDR 6-7 Hz, reactive and symmetric  HR  70 bpm and regular    Background abnormalities:   1. Continuous slow, generalized    Periodic, rhythmic or epileptiform abnormalities:   none   Ictal phenomena:  none    EEG DETAILS:  TYPE OF RECORDING: At least 18-channel digital EEG with using a standard international 10-20 placement with additional EEG electrodes, and 1 additional channel dedicated to the EKG, at a sampling rate of at least 256 Hz. Video was recorded throughout the study. The recording was interpreted using digital review software allowing for montage reformatting, gain and filter changes. Each page was reviewed manually. Automatic spike and seizure detection software and quantitative analysis tools were used as needed.   Description of EEG features: The recording opens with a continuous, moderately variable and reactive, symmetric background.  This consists of diffuse medium amplitude slow delta and overriding, waxing and waning faster theta and spindly beta activity, both maximum central-parasagittal.  After ~08:00 prolonged arousals with the development of a well modulated 7 Hz PDR are seen and persistence of diffuse polymorphic slowing, essentially wakefulness.   During arousals, the patient is obviously extremely agitated and purposeful movement is seen on the video.  The arousals alternate with long periods of diffuse continuous slow, comatose patterns suggesting re-sedation.  Push Button  Events: none  Interpretation: This EEG is indicative of a variable diffuse encephalopathy, alternating between mild and awake to severe and comatose. Sedating medication effect is strongly suspected as the main driver.   No epileptiform discharges, seizures, focal or lateralizing signs are seen.

## 2017-06-09 NOTE — Progress Notes (Signed)
[facility] for Heparin Indication: Hx pulmonary embolus  Allergies  Allergen Reactions  . Allopurinol Nausea And Vomiting    Patient Measurements: Height: _0  (154.9 cm) Weight: 178 lb 12.7 oz (81.1 kg) IBW/kg (Calculated) : 47.8 Heparin Dosing Weight: 63.3kg  Vital Signs: Temp: 98.2 F (36.8 C) (11/24 1955) Temp Source: Oral (11/24 1955) BP: 135/79 (11/24 2000) Pulse Rate: 85 (11/24 2000)  Labs: Recent Labs    06/07/17 0029  06/08/17 0247  06/08/17 1837 06/09/17 0628 06/09/17 0844 06/09/17 1953  HGB  --    < > 10.0*  --  10.0* 9.7*  --   --   HCT  --   --  31.2*  --  31.2* 30.3*  --   --   PLT  --   --  212  --   --  187  --   --   APTT  --    < > 108*   < >  --  30 47* 84*  HEPARINUNFRC  --    < > 0.76*   < >  --  <0.10* 0.16* 0.24*  CREATININE 0.74  --  0.76  --   --  0.67  --   --   TROPONINI <0.03  --   --   --   --   --   --   --    < > = values in this interval not displayed.    Estimated Creatinine Clearance: 57.7 mL/min (by C-G formula based on SCr of 0.67 mg/dL).  Assessment: 76 Yo female patient arrives via EMS with altered mental status and seizures en route to the hospital. She continues on heparin for history of PE while xarelto is on hold. Last dose of Xarelto: 11/20.  Heparin level remains subtherapeutic at 0.24 after rate increase to 1000 units/hr. CBC is stable and no signs of bleeding noted. Heparin level and aPTT correlating so will dose based on heparin level alone.   Goal of Therapy:  Heparin level 0.3-0.7 Monitor platelets by anticoagulation protocol: Yes   Plan:  Increase heparin drip to 1100 units/hr 8 hr heparin level  Daily heparin level and CBC  Jimmy Footman, PharmD, BCPS PGY2 Infectious Diseases Pharmacy Resident  Phone: Rhunette Croft [phone] 06/09/2017 8:28 PM

## 2017-06-09 NOTE — Progress Notes (Signed)
[facility] for Heparin Indication: Hx pulmonary embolus  Allergies  Allergen Reactions  . Allopurinol Nausea And Vomiting    Patient Measurements: Height: 5' 1" (154.9 cm) Weight: 178 lb 12.7 oz (81.1 kg) IBW/kg (Calculated) : 47.8 Heparin Dosing Weight: 63.3kg  Vital Signs: Temp: 98.8 F (37.1 C) (11/24 0813) Temp Source: Oral (11/24 0813) BP: 144/85 (11/24 0830) Pulse Rate: 90 (11/24 0830)  Labs: Recent Labs    06/06/17 1406  06/06/17 1847  06/07/17 0029  06/08/17 0247 06/08/17 1211 06/08/17 1837 06/09/17 0628 06/09/17 0844  HGB 14.6   < >  --   --   --   --  10.0*  --  10.0* 9.7*  --   HCT 45.5   < >  --   --   --   --  31.2*  --  31.2* 30.3*  --   PLT 323  --   --   --   --   --  212  --   --  187  --   APTT 24  --   --    < >  --    < > 108* 87*  --  30 47*  LABPROT 13.5  --   --   --   --   --   --   --   --   --   --   INR 1.04  --   --   --   --   --   --   --   --   --   --   HEPARINUNFRC  --   --   --    < >  --    < > 0.76* 0.61  --  <0.10* 0.16*  CREATININE 0.90   < >  --   --  0.74  --  0.76  --   --  0.67  --   TROPONINI  --   --  <0.03  --  <0.03  --   --   --   --   --   --    < > = values in this interval not displayed.    Estimated Creatinine Clearance: 57.7 mL/min (by C-G formula based on SCr of 0.67 mg/dL).  Assessment: 76 Yo female patient arrives via EMS with altered mental status and seizures en route to the hospital. She continues on heparin for history of PE while xarelto is on hold. Last dose of Xarelto: 11/20. Patient has had 2 low levels of both aPTT and heparin level this morning. The nurse reports that her heparin was off for around an hour last night but is unsure of the timing. Given this information and the slow increase in the level, I will conservatively increase the heparin. The patient's CBC was down slightly from yesterday. No signs of bleeding per RN.   Goal of Therapy:  Heparin level  0.3-0.7 APTT 66-102s Monitor platelets by anticoagulation protocol: Yes   Plan:  Increase heparin drip to 1000 units/hr 8 hr heparin level and aPTT Daily heparin level, aPTT and CBC  Jimmy Footman, PharmD, BCPS PGY2 Infectious Diseases Pharmacy Resident  Phone: Rhunette Croft [phone] 06/09/2017 10:12 AM

## 2017-06-10 ENCOUNTER — Inpatient Hospital Stay (HOSPITAL_COMMUNITY): Payer: Medicare Other

## 2017-06-10 DIAGNOSIS — I2699 Other pulmonary embolism without acute cor pulmonale: Secondary | ICD-10-CM

## 2017-06-10 LAB — RENAL FUNCTION PANEL
ALBUMIN: 2.5 g/dL — AB (ref 3.5–5.0)
ANION GAP: 8 (ref 5–15)
BUN: 10 mg/dL (ref 6–20)
CALCIUM: 7.7 mg/dL — AB (ref 8.9–10.3)
CO2: 23 mmol/L (ref 22–32)
CREATININE: 0.63 mg/dL (ref 0.44–1.00)
Chloride: 106 mmol/L (ref 101–111)
GFR calc non Af Amer: >60 mL/min (ref >60)
GLUCOSE: 173 mg/dL — AB (ref 65–99)
PHOSPHORUS: 2.8 mg/dL (ref 2.5–4.6)
Potassium: 3.6 mmol/L (ref 3.5–5.1)
SODIUM: 137 mmol/L (ref 135–145)

## 2017-06-10 LAB — CBC WITH DIFFERENTIAL/PLATELET
BASOS PCT: 0 %
Basophils Absolute: 0 10*3/uL (ref 0.0–0.1)
EOS ABS: 0.3 10*3/uL (ref 0.0–0.7)
EOS PCT: 2 %
HCT: 30.4 % — ABNORMAL LOW (ref 36.0–46.0)
Hemoglobin: 9.6 g/dL — ABNORMAL LOW (ref 12.0–15.0)
LYMPHS ABS: 1.2 10*3/uL (ref 0.7–4.0)
Lymphocytes Relative: 10 %
MCH: 31.6 pg (ref 26.0–34.0)
MCHC: 31.6 g/dL (ref 30.0–36.0)
MCV: 100 fL (ref 78.0–100.0)
MONOS PCT: 10 %
Monocytes Absolute: 1.2 10*3/uL — ABNORMAL HIGH (ref 0.1–1.0)
NEUTROS PCT: 78 %
Neutro Abs: 9.2 10*3/uL — ABNORMAL HIGH (ref 1.7–7.7)
PLATELETS: 186 10*3/uL (ref 150–400)
RBC: 3.04 MIL/uL — ABNORMAL LOW (ref 3.87–5.11)
RDW: 14.1 % (ref 11.5–15.5)
WBC: 11.9 10*3/uL — AB (ref 4.0–10.5)

## 2017-06-10 LAB — HEPARIN LEVEL (UNFRACTIONATED)
HEPARIN UNFRACTIONATED: 0.26 [IU]/mL — AB (ref 0.30–0.70)
Heparin Unfractionated: 0.4 IU/mL (ref 0.30–0.70)
Heparin Unfractionated: <0.1 IU/mL — ABNORMAL LOW (ref 0.30–0.70)

## 2017-06-10 LAB — BASIC METABOLIC PANEL
ANION GAP: 9 (ref 5–15)
BUN: 8 mg/dL (ref 6–20)
CALCIUM: 7.9 mg/dL — AB (ref 8.9–10.3)
CO2: 24 mmol/L (ref 22–32)
Chloride: 104 mmol/L (ref 101–111)
Creatinine, Ser: 0.55 mg/dL (ref 0.44–1.00)
GLUCOSE: 153 mg/dL — AB (ref 65–99)
Potassium: 3.4 mmol/L — ABNORMAL LOW (ref 3.5–5.1)
SODIUM: 137 mmol/L (ref 135–145)

## 2017-06-10 LAB — GLUCOSE, CAPILLARY
GLUCOSE-CAPILLARY: 166 mg/dL — AB (ref 65–99)
Glucose-Capillary: 176 mg/dL — ABNORMAL HIGH (ref 65–99)
Glucose-Capillary: 167 mg/dL — ABNORMAL HIGH (ref 65–99)
Glucose-Capillary: 144 mg/dL — ABNORMAL HIGH (ref 65–99)
Glucose-Capillary: 237 mg/dL — ABNORMAL HIGH (ref 65–99)

## 2017-06-10 LAB — MAGNESIUM: Magnesium: 1.5 mg/dL — ABNORMAL LOW (ref 1.7–2.4)

## 2017-06-10 MED ORDER — MAGNESIUM SULFATE 4 GM/100ML IV SOLN
4.0000 g | Freq: Once | INTRAVENOUS | Status: AC
  Administered 2017-06-10: 4 g via INTRAVENOUS
  Filled 2017-06-10: qty 100

## 2017-06-10 MED ORDER — FUROSEMIDE 10 MG/ML IJ SOLN
20.0000 mg | Freq: Four times a day (QID) | INTRAMUSCULAR | Status: AC
  Administered 2017-06-10 (×2): 20 mg via INTRAVENOUS
  Filled 2017-06-10 (×2): qty 2

## 2017-06-10 MED ORDER — POTASSIUM CHLORIDE 10 MEQ/50ML IV SOLN: 10.0000 meq | INTRAVENOUS | Status: DC

## 2017-06-10 MED ORDER — INSULIN ASPART 100 UNIT/ML ~~LOC~~ SOLN
0.0000 [IU] | SUBCUTANEOUS | Status: DC
  Administered 2017-06-10: 2 [IU] via SUBCUTANEOUS
  Administered 2017-06-10: 5 [IU] via SUBCUTANEOUS
  Administered 2017-06-10 – 2017-06-11 (×2): 3 [IU] via SUBCUTANEOUS
  Administered 2017-06-11: 5 [IU] via SUBCUTANEOUS
  Administered 2017-06-11 (×3): 3 [IU] via SUBCUTANEOUS
  Administered 2017-06-12 (×2): 5 [IU] via SUBCUTANEOUS

## 2017-06-10 MED ORDER — DEXAMETHASONE SODIUM PHOSPHATE 4 MG/ML IJ SOLN
2.0000 mg | Freq: Four times a day (QID) | INTRAMUSCULAR | Status: AC
  Administered 2017-06-10 (×2): 2 mg via INTRAVENOUS
  Filled 2017-06-10 (×3): qty 0.5

## 2017-06-10 MED ORDER — FENTANYL CITRATE (PF) 100 MCG/2ML IJ SOLN
INTRAMUSCULAR | Status: AC
  Filled 2017-06-10: qty 2

## 2017-06-10 MED ORDER — POTASSIUM CHLORIDE 20 MEQ/15ML (10%) PO SOLN
40.0000 meq | Freq: Once | ORAL | Status: AC
  Administered 2017-06-10: 40 meq
  Filled 2017-06-10: qty 30

## 2017-06-10 MED ORDER — FENTANYL CITRATE (PF) 100 MCG/2ML IJ SOLN
100.0000 ug | Freq: Once | INTRAMUSCULAR | Status: AC
  Administered 2017-06-10: 100 ug via INTRAVENOUS

## 2017-06-10 MED ORDER — WHITE PETROLATUM EX OINT
TOPICAL_OINTMENT | CUTANEOUS | Status: AC
  Administered 2017-06-10: 15:00:00
  Filled 2017-06-10: qty 28.35

## 2017-06-10 MED ORDER — SODIUM CHLORIDE 0.9 % IV SOLN
750.0000 mg | Freq: Two times a day (BID) | INTRAVENOUS | Status: DC
  Administered 2017-06-10 – 2017-06-23 (×26): 750 mg via INTRAVENOUS
  Filled 2017-06-10 (×28): qty 7.5

## 2017-06-10 NOTE — Progress Notes (Signed)
[facility] for Heparin Indication: Hx pulmonary embolus  Allergies  Allergen Reactions  . Allopurinol Nausea And Vomiting    Patient Measurements: Height: _0  (154.9 cm) Weight: 179 lb 10.8 oz (81.5 kg) IBW/kg (Calculated) : 47.8 Heparin Dosing Weight: 63.3kg  Vital Signs: Temp: 98.4 F (36.9 C) (11/25 0813) Temp Source: Oral (11/25 0813) BP: 154/71 (11/25 1000) Pulse Rate: 77 (11/25 0900)  Labs: Recent Labs    06/08/17 0247  06/08/17 1837 06/09/17 0628 06/09/17 0844 06/09/17 1953 06/10/17 0416 06/10/17 0759  HGB 10.0*  --  10.0* 9.7*  --   --  9.6*  --   HCT 31.2*  --  31.2* 30.3*  --   --  30.4*  --   PLT 212  --   --  187  --   --  186  --   APTT 108*   < >  --  30 47* 84*  --   --   HEPARINUNFRC 0.76*   < >  --  <0.10* 0.16* 0.24* 0.26* 0.40  CREATININE 0.76  --   --  0.67  --   --  0.63  --    < > = values in this interval not displayed.    Estimated Creatinine Clearance: 57.9 mL/min (by C-G formula based on SCr of 0.63 mg/dL).  Assessment: 76 Yo female patient arrives via EMS with altered mental status and seizures en route to the hospital. She continues on heparin for history of PE while xarelto is on hold. Last dose of Xarelto: 11/20.  Heparin level therapeutic this morning after rate increase. CBC is stable and no signs of bleeding have been noted.   Goal of Therapy:  Heparin level 0.3-0.7 Monitor platelets by anticoagulation protocol: Yes   Plan:  Continue heparin drip at 1100 units/hr 8 hour heparin level to confirm Daily heparin level and CBC Monitor for signs/symptoms of bleeding   Jimmy Footman, PharmD, BCPS PGY2 Infectious Diseases Pharmacy Resident  Phone: [phone] 06/10/2017 10:48 AM

## 2017-06-10 NOTE — Progress Notes (Signed)
Pt with no IV access due to infiltration of multiple IV sites.  Spoke with Hayden Pedro, NP at bedside, regarding possibilility for placement of CVL. Will continue to monitor.

## 2017-06-10 NOTE — Progress Notes (Signed)
[facility] Attending Note  ADMISSION DATE:  06/06/2017  REFERRING MD:  Zenovia Jarred, M.D. / EDP   CHIEF COMPLAINT:  Status Epilepticus   Presenting HPI:  76 y.o. female with history of depression, DVT with pulmonary embolism, OSA, and diabetes mellitus type 2 presenting on 11/21 with altered mentation to the emergency department. Initially patient was called as a code stroke. In route to the emergency department she had a seizure and was given 2.5 mg of Versed. In the emergency department patient was noted to have recurrent seizure activity after returning from brain imaging. She received a total of 6 mg of Ativan and subsequently had altered level of consciousness in her postictal and sedated state. Patient was not protecting airway and was subsequently endotracheally intubated. Patient was admitted to the ICU on continuous EEG monitoring. Has no known history of seizures.  Subjective:  No acute events overnight. Currently on minimal sedation & tolerating SBT.  Review of Systems:  Unable to obtain given intubation.   Vent Mode: CPAP;PSV FiO2 (%):  [30 %-40 %] 30 % Set Rate:  [14 bmp] 14 bmp Vt Set:  [380 mL] 380 mL PEEP:  [5 cmH20] 5 cmH20 Pressure Support:  [5 cmH20] 5 cmH20 Plateau Pressure:  [13 cmH20-18 cmH20] 18 cmH20  Temp:  [97.9 F (36.6 C)-99.1 F (37.3 C)] 98.6 F (37 C) (11/25 1143) Pulse Rate:  [52-96] 80 (11/25 1114) Resp:  [12-25] 24 (11/25 1114) BP: (107-173)/(57-122) 156/73 (11/25 1114) SpO2:  [97 %-100 %] 99 % (11/25 1114) FiO2 (%):  [30 %-40 %] 30 % (11/25 1114) Weight:  [179 lb 10.8 oz (81.5 kg)] 179 lb 10.8 oz (81.5 kg) (11/25 0500)   Gen.: No distress. Awake. Husband and son at bedside. Integument: No rash. Warm. Dry. HEENT: No cuff leak. Endotracheal tube in place. No scleral icterus. Neurological: Grossly nonfocal. Following commands. He tends to voice. Cardiovascular: Regular rate. No JVD appreciated. Pulmonary: Clear  bilaterally with auscultation. Normal work of breathing on pressure support 0/5. Abdomen: Soft. Nondistended. Normal bowel sounds.  LINES/TUBES: OETT 11/21 >>> Foley 11/21 >>> OGT 11/21 >>> PIV  CBC Latest Ref Rng & Units 06/10/2017 06/09/2017 06/08/2017  WBC 4.0 - 10.5 K/uL 11.9(H) 11.6(H) -  Hemoglobin 12.0 - 15.0 g/dL 9.6(L) 9.7(L) 10.0(L)  Hematocrit 36.0 - 46.0 % 30.4(L) 30.3(L) 31.2(L)  Platelets 150 - 400 K/uL 186 187 -   BMP Latest Ref Rng & Units 06/10/2017 06/09/2017 06/08/2017  Glucose 65 - 99 mg/dL 173(H) 143(H) 103(H)  BUN 6 - 20 mg/dL _0 Creatinine 0.44 - 1.00 mg/dL 0.63 0.67 0.76  Sodium 135 - 145 mmol/L 137 137 139  Potassium 3.5 - 5.1 mmol/L 3.6 3.9 3.4(L)  Chloride 101 - 111 mmol/L 106 104 102  CO2 22 - 32 mmol/L _1 Calcium 8.9 - 10.3 mg/dL 7.7(L) 7.6(L) 7.6(L)    Hepatic Function Latest Ref Rng & Units 06/10/2017 06/09/2017 06/08/2017  Total Protein 6.5 - 8.1 g/dL - - -  Albumin 3.5 - 5.0 g/dL 2.5(L) 2.5(L) 2.5(L)  AST 15 - 41 U/L - - -  ALT 14 - 54 U/L - - -  Alk Phosphatase 38 - 126 U/L - - -  Total Bilirubin 0.3 - 1.2 mg/dL - - -    IMAGING/STUDIES: CT HEAD W/O 11/21:  No acute cortically based infarct or intracranial hemorrhage identified. ASPECTS is 10.  Increased chronic nonspecific cerebral volume loss since 2014. 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 PORT CXR 11/22:  Previously reviewed by me. Persistent silhouetting left hemidiaphragm suggestive of opacification versus consolidation. Endotracheal tube in good position. Enteric feeding tube coursing below diaphragm. EEG 11/23: This EEG is indicative of a severe diffuse encephalopathy, non-specific as to etiology. Sedating medication effect cannot be excluded. No epileptiform discharges, seizures, focal or lateralizing signs are seen. Compared to previous epoch, there is minimal improvement in the degree of encephalopathy. EEG 11/24:  This EEG is indicative of a variable  diffuse encephalopathy, alternating between mild and awake to severe and comatose. Sedating medication effect is strongly suspected as the main driver. No epileptiform discharges, seizures, focal or lateralizing signs are seen.  MRI BRAIN W/O 11/24: IMPRESSION: 1. No acute abnormality. 2. Chronic ischemic microangiopathy. 3. Advanced cerebral volume loss that is asymmetrically worse on the left to a mild degree.  MICROBIOLOGY: MRSA PCR 11/21:  Negative   ANTIBIOTICS: None.  SIGNIFICANT EVENTS: 11/21 - Admit with status epilepticus  11/22 - Taken off Propofol 11/24 - Following commands more consistently. Continuous EEG removed & MRI pending 11/25 - No cuff leak >> decadron & lasix  ASSESSMENT/PLAN:  76 y.o. female admitted with status epilepticus. MRI without obvious focal source. Hemoglobin stable on systemic and coagulation. Has no cuff leak this morning per inhibiting extubation.  1. Status epilepticus: New onset seizure disorder. Continuing Keppra indefinitely per neurology. Continuing seizure precautions.  2. Acute hypoxic respiratory failure: Suspect retropharyngeal edema without cuff leak. Administering Lasix and Decadron. Repeat spontaneous breathing trial and cuff leak check tomorrow morning. 3. Acute encephalopathy: Resolving. Likely postictal state and complicated by sedation. Continuing Precedex infusion.   4. OSA: On home BiPAP therapy. Plan to restart BiPAP therapy after extubation. 5. 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 Continuing heparin drip per pharmacy protocol. 6. Diabetes mellitus type 2: Glucose controlled. Continuing Accu-Cheks every 4 hours with sliding scale insulin per moderate algorithm. Holding home metformin. 7. Hypothyroidism: Continuing IV Synthroid daily. 8. Hypotension: Resolved. Secondary to fosphenytoin infusion. 9. Essential hypertension: Mild elevation. Monitoring vitals per unit protocol. Holding home Toprol-XL and Aldactone.   10. Hypomagnesemia: Magnesium sulfate 4 g IV. Trending electrolytes every 12 hours for now. 11. Hypocalcemia: Mild electrolytes daily. 12. Anemia: Possible gastritis/GI bleed. Hemoglobin stable. Trending daily with CBC. Continuing Protonix IV every 12 hours. 13. Hypomagnesemia: Resolved. 14. Hypokalemia: Resolved.  Prophylaxis:  Heparin drip per protocol & Protonix IV BID. Diet:  NPO. Tube feedings as per dietary consult. Code Status:  Full Code per previous physician discussion. Disposition:  Remains in ICU while awaiting improvement in mental status and extubation. Family Update: Husband and son updated this morning during rounds.  I have personally spent a total of 31 minutes of critical care time today caring for the patient, updating family at bedside, & reviewing the patient's electronic medical record.  Sonia Baller Ashok Cordia, M.D. [facility] Pager:  [phone] After 7pm or if no response, call [phone] 12:05 PM 06/10/17

## 2017-06-10 NOTE — Progress Notes (Signed)
[facility] for Heparin Indication: Hx pulmonary embolus  Allergies  Allergen Reactions  . Allopurinol Nausea And Vomiting    Patient Measurements: Height: _0  (154.9 cm) Weight: 179 lb 10.8 oz (81.5 kg) IBW/kg (Calculated) : 47.8 Heparin Dosing Weight: 63.3kg  Vital Signs: Temp: 98.6 F (37 C) (11/25 1700) Temp Source: Oral (11/25 1700) BP: 173/86 (11/25 1538) Pulse Rate: 83 (11/25 1538)  Labs: Recent Labs    06/08/17 0247  06/08/17 1837 06/09/17 0628 06/09/17 0844 06/09/17 1953 06/10/17 0416 06/10/17 0759 06/10/17 1603  HGB 10.0*  --  10.0* 9.7*  --   --  9.6*  --   --   HCT 31.2*  --  31.2* 30.3*  --   --  30.4*  --   --   PLT 212  --   --  187  --   --  186  --   --   APTT 108*   < >  --  30 47* 84*  --   --   --   HEPARINUNFRC 0.76*   < >  --  <0.10* 0.16* 0.24* 0.26* 0.40 <0.10*  CREATININE 0.76  --   --  0.67  --   --  0.63  --  0.55   < > = values in this interval not displayed.    Estimated Creatinine Clearance: 57.9 mL/min (by C-G formula based on SCr of 0.55 mg/dL).  Assessment: 76 Yo female patient arrives via EMS with altered mental status and seizures en route to the hospital. She continues on heparin for history of PE while xarelto is on hold. Last dose of Xarelto: 11/20.  Heparin level therapeutic this morning after rate increase. CBC is stable and no signs of bleeding have been noted.   Goal of Therapy:  Heparin level 0.3-0.7 Monitor platelets by anticoagulation protocol: Yes   Plan:  Continue heparin drip at 1100 units/hr 8 hour heparin level to confirm Daily heparin level and CBC Monitor for signs/symptoms of bleeding   Jimmy Footman, PharmD, BCPS PGY2 Infectious Diseases Pharmacy Resident  Phone: Rhunette Croft [phone] 06/10/2017 5:52 PM   Confirmatory heparin level was low. Spoke with nurse and arm looks puffy. Think it might be a problem with the line. Will keep current rate and re-check in the morning. Nurse  working on [employer].  Susa Raring, PharmD, BCPS Phone: Rhunette Croft [phone]

## 2017-06-10 NOTE — Progress Notes (Signed)
PCCM Interval Note   Patient with multiple infiltrated IV throughout the day. IV Team attempted access as well. U/S guided PIV placed and again infiltrated. Spoke to husband about central line placement whom agreed with plan and understands risks.   Hayden Pedro, AGACNP-BC [facility]  Pgr: [phone]  PCCM Pgr: [phone]

## 2017-06-10 NOTE — Progress Notes (Signed)
Pt transported to MRI & back to 2M09 with no issues.

## 2017-06-10 NOTE — Progress Notes (Signed)
Subjective: Continued cognitive improvement. MRI completed. Still intubated.   Objective: Current vital signs: BP (!) 173/81   Pulse (!) 53   Temp 98.4 F (36.9 C) (Oral)   Resp 18   Ht _0  (1.549 m)   Wt 81.5 kg (179 lb 10.8 oz)   SpO2 100%   BMI 33.95 kg/m  Vital signs in last 24 hours: Temp:  [97.9 F (36.6 C)-99.4 F (37.4 C)] 98.4 F (36.9 C) (11/25 0813) Pulse Rate:  [52-96] 53 (11/25 0715) Resp:  [12-25] 18 (11/25 0715) BP: (107-173)/(57-122) 173/81 (11/25 0715) SpO2:  [95 %-100 %] 100 % (11/25 0715) FiO2 (%):  [30 %-40 %] 30 % (11/25 0715) Weight:  [81.5 kg (179 lb 10.8 oz)] 81.5 kg (179 lb 10.8 oz) (11/25 0500)  Intake/Output from previous day: 11/24 0701 - 11/25 0700 In: 3306.1 [I.V.:2711.1; NG/GT:495; IV Piggyback:100] Out: 1025 [Urine:1025] Intake/Output this shift: No intake/output data recorded. Nutritional status: Diet NPO time specified  Neurologic Exam: Ment: Awake and alert. Appears to be uncomfortable and slightly agitated.Attempts to ask questions by mouthing words over the ET tube, but not comprehensible. Moves all 4 extremities equally without delay to commands.    CN: Pupils reactive and equal. Fixates on visual stimuli. Spontaneous EOM are intact horizontally. No nystagmus. Face symmetric. Mouthing words around ET tube.  Motor/Sensory: Moves all 4 extremities equally without delay to commands. Purposeful movements all 4 extremities. Strength >/= 3/5 x 4 without asymmetry.  Reflexes: No asymmetry noted.  Lab Results: Results for orders placed or performed during the hospital encounter of 06/06/17 (from the past 48 hour(s))  Type and screen [place]     Status: None   Collection Time: 06/08/17 10:35 AM  Result Value Ref Range   ABO/RH(D) A POS    Antibody Screen NEG    Sample Expiration 06/11/2017   ABO/Rh     Status: None   Collection Time: 06/08/17 10:35 AM  Result Value Ref Range   ABO/RH(D) A POS   Glucose,  capillary     Status: Abnormal   Collection Time: 06/08/17 11:18 AM  Result Value Ref Range   Glucose-Capillary 119 (H) 65 - 99 mg/dL   Comment 1 Capillary Specimen    Comment 2 Notify RN   APTT     Status: Abnormal   Collection Time: 06/08/17 12:11 PM  Result Value Ref Range   aPTT 87 (H) 24 - 36 seconds    Comment:        IF BASELINE aPTT IS ELEVATED, SUGGEST PATIENT RISK ASSESSMENT BE USED TO DETERMINE APPROPRIATE ANTICOAGULANT THERAPY.   Heparin level (unfractionated)     Status: None   Collection Time: 06/08/17 12:11 PM  Result Value Ref Range   Heparin Unfractionated 0.61 0.30 - 0.70 IU/mL    Comment:        IF HEPARIN RESULTS ARE BELOW EXPECTED VALUES, AND PATIENT DOSAGE HAS BEEN CONFIRMED, SUGGEST FOLLOW UP TESTING OF ANTITHROMBIN III LEVELS.   Glucose, capillary     Status: Abnormal   Collection Time: 06/08/17  3:30 PM  Result Value Ref Range   Glucose-Capillary 148 (H) 65 - 99 mg/dL   Comment 1 Capillary Specimen    Comment 2 Notify RN   Hemoglobin and hematocrit, blood     Status: Abnormal   Collection Time: 06/08/17  6:37 PM  Result Value Ref Range   Hemoglobin 10.0 (L) 12.0 - 15.0 g/dL   HCT 31.2 (L) 36.0 - 46.0 %  Magnesium     Status: None   Collection Time: 06/08/17  6:37 PM  Result Value Ref Range   Magnesium 2.0 1.7 - 2.4 mg/dL  Phosphorus     Status: None   Collection Time: 06/08/17  6:37 PM  Result Value Ref Range   Phosphorus 3.3 2.5 - 4.6 mg/dL  Glucose, capillary     Status: Abnormal   Collection Time: 06/08/17  7:48 PM  Result Value Ref Range   Glucose-Capillary 165 (H) 65 - 99 mg/dL   Comment 1 Notify RN   Glucose, capillary     Status: Abnormal   Collection Time: 06/08/17 11:48 PM  Result Value Ref Range   Glucose-Capillary 154 (H) 65 - 99 mg/dL   Comment 1 Notify RN   Glucose, capillary     Status: Abnormal   Collection Time: 06/09/17  3:50 AM  Result Value Ref Range   Glucose-Capillary 117 (H) 65 - 99 mg/dL  Heparin level  (unfractionated)     Status: Abnormal   Collection Time: 06/09/17  6:28 AM  Result Value Ref Range   Heparin Unfractionated <0.10 (L) 0.30 - 0.70 IU/mL    Comment:        IF HEPARIN RESULTS ARE BELOW EXPECTED VALUES, AND PATIENT DOSAGE HAS BEEN CONFIRMED, SUGGEST FOLLOW UP TESTING OF ANTITHROMBIN III LEVELS.   APTT     Status: None   Collection Time: 06/09/17  6:28 AM  Result Value Ref Range   aPTT 30 24 - 36 seconds  Renal function panel     Status: Abnormal   Collection Time: 06/09/17  6:28 AM  Result Value Ref Range   Sodium 137 135 - 145 mmol/L   Potassium 3.9 3.5 - 5.1 mmol/L   Chloride 104 101 - 111 mmol/L   CO2 26 22 - 32 mmol/L   Glucose, Bld 143 (H) 65 - 99 mg/dL   BUN 8 6 - 20 mg/dL   Creatinine, Ser 0.67 0.44 - 1.00 mg/dL   Calcium 7.6 (L) 8.9 - 10.3 mg/dL   Phosphorus 2.9 2.5 - 4.6 mg/dL   Albumin 2.5 (L) 3.5 - 5.0 g/dL   GFR calc non Af Amer >60 >60 mL/min   GFR calc Af Amer >60 >60 mL/min    Comment: (NOTE) The eGFR has been calculated using the CKD EPI equation. This calculation has not been validated in all clinical situations. eGFR's persistently <60 mL/min signify possible Chronic Kidney Disease.    Anion gap 7 5 - 15  Magnesium     Status: None   Collection Time: 06/09/17  6:28 AM  Result Value Ref Range   Magnesium 1.8 1.7 - 2.4 mg/dL  CBC with Differential/Platelet     Status: Abnormal   Collection Time: 06/09/17  6:28 AM  Result Value Ref Range   WBC 11.6 (H) 4.0 - 10.5 K/uL   RBC 3.12 (L) 3.87 - 5.11 MIL/uL   Hemoglobin 9.7 (L) 12.0 - 15.0 g/dL   HCT 30.3 (L) 36.0 - 46.0 %   MCV 97.1 78.0 - 100.0 fL   MCH 31.1 26.0 - 34.0 pg   MCHC 32.0 30.0 - 36.0 g/dL   RDW 13.8 11.5 - 15.5 %   Platelets 187 150 - 400 K/uL   Neutrophils Relative % 81 %   Neutro Abs 9.4 (H) 1.7 - 7.7 K/uL   Lymphocytes Relative 8 %   Lymphs Abs 0.9 0.7 - 4.0 K/uL   Monocytes Relative 10 %   Monocytes Absolute 1.1 (  H) 0.1 - 1.0 K/uL   Eosinophils Relative 1 %    Eosinophils Absolute 0.1 0.0 - 0.7 K/uL   Basophils Relative 0 %   Basophils Absolute 0.0 0.0 - 0.1 K/uL  Phosphorus     Status: None   Collection Time: 06/09/17  6:28 AM  Result Value Ref Range   Phosphorus 2.9 2.5 - 4.6 mg/dL  Glucose, capillary     Status: Abnormal   Collection Time: 06/09/17  8:16 AM  Result Value Ref Range   Glucose-Capillary 163 (H) 65 - 99 mg/dL   Comment 1 Capillary Specimen    Comment 2 Notify RN   APTT     Status: Abnormal   Collection Time: 06/09/17  8:44 AM  Result Value Ref Range   aPTT 47 (H) 24 - 36 seconds    Comment:        IF BASELINE aPTT IS ELEVATED, SUGGEST PATIENT RISK ASSESSMENT BE USED TO DETERMINE APPROPRIATE ANTICOAGULANT THERAPY.   Heparin level (unfractionated)     Status: Abnormal   Collection Time: 06/09/17  8:44 AM  Result Value Ref Range   Heparin Unfractionated 0.16 (L) 0.30 - 0.70 IU/mL    Comment:        IF HEPARIN RESULTS ARE BELOW EXPECTED VALUES, AND PATIENT DOSAGE HAS BEEN CONFIRMED, SUGGEST FOLLOW UP TESTING OF ANTITHROMBIN III LEVELS.   Glucose, capillary     Status: Abnormal   Collection Time: 06/09/17 11:58 AM  Result Value Ref Range   Glucose-Capillary 199 (H) 65 - 99 mg/dL   Comment 1 Capillary Specimen    Comment 2 Notify RN   Glucose, capillary     Status: Abnormal   Collection Time: 06/09/17  5:02 PM  Result Value Ref Range   Glucose-Capillary 134 (H) 65 - 99 mg/dL   Comment 1 Capillary Specimen    Comment 2 Notify RN   Glucose, capillary     Status: Abnormal   Collection Time: 06/09/17  7:50 PM  Result Value Ref Range   Glucose-Capillary 140 (H) 65 - 99 mg/dL   Comment 1 Notify RN   Heparin level (unfractionated)     Status: Abnormal   Collection Time: 06/09/17  7:53 PM  Result Value Ref Range   Heparin Unfractionated 0.24 (L) 0.30 - 0.70 IU/mL    Comment:        IF HEPARIN RESULTS ARE BELOW EXPECTED VALUES, AND PATIENT DOSAGE HAS BEEN CONFIRMED, SUGGEST FOLLOW UP TESTING OF ANTITHROMBIN III  LEVELS.   APTT     Status: Abnormal   Collection Time: 06/09/17  7:53 PM  Result Value Ref Range   aPTT 84 (H) 24 - 36 seconds    Comment:        IF BASELINE aPTT IS ELEVATED, SUGGEST PATIENT RISK ASSESSMENT BE USED TO DETERMINE APPROPRIATE ANTICOAGULANT THERAPY.   Triglycerides     Status: None   Collection Time: 06/09/17  7:54 PM  Result Value Ref Range   Triglycerides 85 <150 mg/dL  Glucose, capillary     Status: None   Collection Time: 06/09/17 11:54 PM  Result Value Ref Range   Glucose-Capillary 97 65 - 99 mg/dL   Comment 1 Notify RN   Glucose, capillary     Status: Abnormal   Collection Time: 06/10/17  4:08 AM  Result Value Ref Range   Glucose-Capillary 166 (H) 65 - 99 mg/dL   Comment 1 Notify RN   Heparin level (unfractionated)     Status: Abnormal   Collection  Time: 06/10/17  4:16 AM  Result Value Ref Range   Heparin Unfractionated 0.26 (L) 0.30 - 0.70 IU/mL    Comment:        IF HEPARIN RESULTS ARE BELOW EXPECTED VALUES, AND PATIENT DOSAGE HAS BEEN CONFIRMED, SUGGEST FOLLOW UP TESTING OF ANTITHROMBIN III LEVELS.   Renal function panel     Status: Abnormal   Collection Time: 06/10/17  4:16 AM  Result Value Ref Range   Sodium 137 135 - 145 mmol/L   Potassium 3.6 3.5 - 5.1 mmol/L   Chloride 106 101 - 111 mmol/L   CO2 23 22 - 32 mmol/L   Glucose, Bld 173 (H) 65 - 99 mg/dL   BUN 10 6 - 20 mg/dL   Creatinine, Ser 0.63 0.44 - 1.00 mg/dL   Calcium 7.7 (L) 8.9 - 10.3 mg/dL   Phosphorus 2.8 2.5 - 4.6 mg/dL   Albumin 2.5 (L) 3.5 - 5.0 g/dL   GFR calc non Af Amer >60 >60 mL/min   GFR calc Af Amer >60 >60 mL/min    Comment: (NOTE) The eGFR has been calculated using the CKD EPI equation. This calculation has not been validated in all clinical situations. eGFR's persistently <60 mL/min signify possible Chronic Kidney Disease.    Anion gap 8 5 - 15  Magnesium     Status: Abnormal   Collection Time: 06/10/17  4:16 AM  Result Value Ref Range   Magnesium 1.5 (L)  1.7 - 2.4 mg/dL  CBC with Differential/Platelet     Status: Abnormal   Collection Time: 06/10/17  4:16 AM  Result Value Ref Range   WBC 11.9 (H) 4.0 - 10.5 K/uL   RBC 3.04 (L) 3.87 - 5.11 MIL/uL   Hemoglobin 9.6 (L) 12.0 - 15.0 g/dL   HCT 30.4 (L) 36.0 - 46.0 %   MCV 100.0 78.0 - 100.0 fL   MCH 31.6 26.0 - 34.0 pg   MCHC 31.6 30.0 - 36.0 g/dL   RDW 14.1 11.5 - 15.5 %   Platelets 186 150 - 400 K/uL   Neutrophils Relative % 78 %   Neutro Abs 9.2 (H) 1.7 - 7.7 K/uL   Lymphocytes Relative 10 %   Lymphs Abs 1.2 0.7 - 4.0 K/uL   Monocytes Relative 10 %   Monocytes Absolute 1.2 (H) 0.1 - 1.0 K/uL   Eosinophils Relative 2 %   Eosinophils Absolute 0.3 0.0 - 0.7 K/uL   Basophils Relative 0 %   Basophils Absolute 0.0 0.0 - 0.1 K/uL  Heparin level (unfractionated)     Status: None   Collection Time: 06/10/17  7:59 AM  Result Value Ref Range   Heparin Unfractionated 0.40 0.30 - 0.70 IU/mL    Comment:        IF HEPARIN RESULTS ARE BELOW EXPECTED VALUES, AND PATIENT DOSAGE HAS BEEN CONFIRMED, SUGGEST FOLLOW UP TESTING OF ANTITHROMBIN III LEVELS.   Glucose, capillary     Status: Abnormal   Collection Time: 06/10/17  8:15 AM  Result Value Ref Range   Glucose-Capillary 176 (H) 65 - 99 mg/dL   Comment 1 Capillary Specimen    Comment 2 Notify RN     Recent Results (from the past 240 hour(s))  MRSA PCR Screening     Status: None   Collection Time: 06/06/17  6:00 PM  Result Value Ref Range Status   MRSA by PCR NEGATIVE NEGATIVE Final    Comment:        The GeneXpert MRSA Assay (FDA approved  for NASAL specimens only), is one component of a comprehensive MRSA colonization surveillance program. It is not intended to diagnose MRSA infection nor to guide or monitor treatment for MRSA infections.     Lipid Panel Recent Labs    06/09/17 1954  TRIG 85    Studies/Results: Mr Brain Wo Contrast  Result Date: 06/09/2017 CLINICAL DATA:  Focal neurologic deficit. EXAM: MRI HEAD  WITHOUT CONTRAST TECHNIQUE: Multiplanar, multiecho pulse sequences of the brain and surrounding structures were obtained without intravenous contrast. COMPARISON:  Head CT 06/06/2017 FINDINGS: Brain: The midline structures are normal. There is no acute infarct or acute hemorrhage. No mass lesion, hydrocephalus, dural abnormality or extra-axial collection. There is multifocal white matter hyperintensity suggesting chronic ischemic microangiopathy. Volume loss is mildly asymmetric with greatest atrophy in the left frontal and parietal lobes. No chronic microhemorrhage or superficial siderosis. Vascular: Major intracranial arterial and venous sinus flow voids are preserved. Skull and upper cervical spine: The visualized skull base, calvarium, upper cervical spine and extracranial soft tissues are normal. Sinuses/Orbits: Bilateral mastoid effusions. Paranasal sinuses are clear. Normal orbits. IMPRESSION: 1. No acute abnormality. 2. Chronic ischemic microangiopathy. 3. Advanced cerebral volume loss that is asymmetrically worse on the left to a mild degree. Electronically Signed   By: Ulyses Jarred M.D.   On: 06/09/2017 23:39   Dg Abd Portable 1v  Result Date: 06/08/2017 CLINICAL DATA:  Repositioning of orogastric tube. EXAM: PORTABLE ABDOMEN - 1 VIEW COMPARISON:  Earlier the same date. FINDINGS: 1208 hours. No significant change in position of the orogastric tube. The tube projects below the diaphragm, tip in the fundal region. The visualized bowel gas pattern is normal. Mildly increased density at the left lung base and probable left pleural effusion. IMPRESSION: No significant change in position of the orogastric tube, tip overlapping the proximal stomach. Electronically Signed   By: Richardean Sale M.D.   On: 06/08/2017 12:25   Dg Abd Portable 1v  Result Date: 06/08/2017 CLINICAL DATA:  Orogastric tube placement. EXAM: PORTABLE ABDOMEN - 1 VIEW COMPARISON:  Radiographs 06/06/2017.  CT 05/27/2017. FINDINGS:  1126 hours. The orogastric tube is looped over the left upper quadrant of the abdomen, tip likely in the proximal stomach. The visualized bowel gas pattern is normal. Cholecystectomy clips and a convex right lumbar scoliosis are noted. IMPRESSION: Orogastric tube appears looped in the proximal stomach. Electronically Signed   By: Richardean Sale M.D.   On: 06/08/2017 11:38    Medications:  Scheduled: . chlorhexidine gluconate (MEDLINE KIT)  15 mL Mouth Rinse BID  . feeding supplement (VITAL HIGH PROTEIN)  1,000 mL Per Tube Q24H  . insulin aspart  0-9 Units Subcutaneous Q4H  . levothyroxine  50 mcg Intravenous Daily  . mouth rinse  15 mL Mouth Rinse QID  . pantoprazole (PROTONIX) IV  40 mg Intravenous Q12H   Continuous: . sodium chloride    . sodium chloride 100 mL/hr at 06/10/17 0400  . dexmedetomidine (PRECEDEX) IV infusion 1 mcg/kg/hr (06/10/17 2536)  . heparin 1,100 Units/hr (06/10/17 0539)  . levETIRAcetam Stopped (06/10/17 0538)  . propofol (DIPRIVAN) infusion Stopped (06/07/17 0931)   UYQ:IHKVQQ chloride, bisacodyl, docusate, fentaNYL (SUBLIMAZE) injection, midazolam  MRI brain:  1. No acute abnormality. 2. Chronic ischemic microangiopathy. 3. Advanced cerebral volume loss that is asymmetrically worse on the left to a mild degree.  Assessment:76 year old female presenting with status epilepticus, now resolved. No prior history of seizures. No recent illnesses or other precipitating events described by her husband. No  benzodiazepine use at home and not withdrawing from alcohol. 1.Exam continues to improve. Attends to examiner and follows commands more briskly than yesterday's exam (Saturday).MRI obtained overnight reveals no acute abnormality. Diffuse L > R atrophy and chronic small vessel ischemic changes are noted.   2.Final LTMEEG tracing showed improvement, correlating with improvement on clinical exam. 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 Of note, sedating medication effectwas strongly suspected as the main driver. LTM EEG has been discontinued.  Recommendations: 1.DecreasingKeppradose to 750 mg IV BID. Switch to PO when extubated. After discharge, will need outpatient Neurology follow up.  2.On Precedex for agitation. Attempt to wean off sedation.  3.Continue to attempt to wean off ventilator.  4. Monitor and correct electrolytes as indicated. 5. Discussed with husband this AM.  6. Discussed case with Dr. Ashok Cordia. Neurology will sign off. Please call if there are additional questions.     LOS: 4 days   _0  signed: Dr. Kerney Elbe 06/10/2017  8:56 AM

## 2017-06-10 NOTE — Procedures (Signed)
Central Venous Catheter Insertion Procedure Note Amanda Short [id number] 10-Apr-1941  Procedure: Insertion of Central Venous Catheter Indications: Assessment of intravascular volume, Drug and/or fluid administration and Frequent blood sampling  Procedure Details Consent: Risks of procedure as well as the alternatives and risks of each were explained to the (patient/caregiver).  Consent for procedure obtained. Time Out: Verified patient identification, verified procedure, site/side was marked, verified correct patient position, special equipment/implants available, medications/allergies/relevent history reviewed, required imaging and test results available.  Performed  Maximum sterile technique was used including antiseptics, cap, gloves, gown, hand hygiene, mask and sheet. Skin prep: Chlorhexidine; local anesthetic administered A antimicrobial bonded/coated triple lumen catheter was placed in the left internal jugular vein using the Seldinger technique.  Evaluation Blood flow good Complications: No apparent complications Patient did tolerate procedure well. Chest X-ray ordered to verify placement.  CXR: pending.  Hayden Pedro, AGACNP-BC [facility]  Pgr: [phone]  PCCM Pgr: [phone]

## 2017-06-10 NOTE — Progress Notes (Signed)
ANTICOAGULATION CONSULT NOTE - Follow Up Consult  Pharmacy Consult for heparin Indication: h/o PE  Labs: Recent Labs    06/08/17 0247  06/08/17 1837 06/09/17 0628 06/09/17 0844 06/09/17 1953 06/10/17 0416  HGB 10.0*  --  10.0* 9.7*  --   --  9.6*  HCT 31.2*  --  31.2* 30.3*  --   --  30.4*  PLT 212  --   --  187  --   --  186  APTT 108*   < >  --  30 47* 84*  --   HEPARINUNFRC 0.76*   < >  --  <0.10* 0.16* 0.24* 0.26*  CREATININE 0.76  --   --  0.67  --   --  0.63   < > = values in this interval not displayed.    Assessment/Plan: 76yo female remains subtherapeutic on heparin after rate increase though RN reports that heparin was off ~42mn for MRI and then IV did not seem patent so IV site was changed.  Will continue gtt at current rate for now and check another level.  VWynona Neat PharmD, BCPS  06/10/2017,5:26 AM

## 2017-06-11 LAB — CBC WITH DIFFERENTIAL/PLATELET
BASOS ABS: 0 10*3/uL (ref 0.0–0.1)
Basophils Relative: 0 %
EOS ABS: 0 10*3/uL (ref 0.0–0.7)
EOS PCT: 0 %
HEMATOCRIT: 29.6 % — AB (ref 36.0–46.0)
Hemoglobin: 9.5 g/dL — ABNORMAL LOW (ref 12.0–15.0)
Lymphocytes Relative: 8 %
Lymphs Abs: 1 10*3/uL (ref 0.7–4.0)
MCH: 31.1 pg (ref 26.0–34.0)
MCHC: 32.1 g/dL (ref 30.0–36.0)
MCV: 97 fL (ref 78.0–100.0)
Monocytes Absolute: 0.9 10*3/uL (ref 0.1–1.0)
Monocytes Relative: 7 %
Neutro Abs: 10.7 10*3/uL — ABNORMAL HIGH (ref 1.7–7.7)
Neutrophils Relative %: 85 %
Platelets: 204 10*3/uL (ref 150–400)
RBC: 3.05 MIL/uL — AB (ref 3.87–5.11)
RDW: 14 % (ref 11.5–15.5)
WBC: 12.6 10*3/uL — AB (ref 4.0–10.5)

## 2017-06-11 LAB — RENAL FUNCTION PANEL
ALBUMIN: 2.5 g/dL — AB (ref 3.5–5.0)
Anion gap: 8 (ref 5–15)
BUN: 7 mg/dL (ref 6–20)
CALCIUM: 7.9 mg/dL — AB (ref 8.9–10.3)
CO2: 29 mmol/L (ref 22–32)
CREATININE: 0.6 mg/dL (ref 0.44–1.00)
Chloride: 101 mmol/L (ref 101–111)
GFR calc Af Amer: >60 mL/min (ref >60)
Glucose, Bld: 188 mg/dL — ABNORMAL HIGH (ref 65–99)
PHOSPHORUS: 2.9 mg/dL (ref 2.5–4.6)
Potassium: 3.9 mmol/L (ref 3.5–5.1)
Sodium: 138 mmol/L (ref 135–145)

## 2017-06-11 LAB — GLUCOSE, CAPILLARY
GLUCOSE-CAPILLARY: 162 mg/dL — AB (ref 65–99)
GLUCOSE-CAPILLARY: 182 mg/dL — AB (ref 65–99)
GLUCOSE-CAPILLARY: 189 mg/dL — AB (ref 65–99)
GLUCOSE-CAPILLARY: 195 mg/dL — AB (ref 65–99)
GLUCOSE-CAPILLARY: 200 mg/dL — AB (ref 65–99)
GLUCOSE-CAPILLARY: 202 mg/dL — AB (ref 65–99)
Glucose-Capillary: 224 mg/dL — ABNORMAL HIGH (ref 65–99)

## 2017-06-11 LAB — THYROID PEROXIDASE ANTIBODY

## 2017-06-11 LAB — HEPARIN LEVEL (UNFRACTIONATED)
HEPARIN UNFRACTIONATED: 0.33 [IU]/mL (ref 0.30–0.70)
Heparin Unfractionated: 0.11 IU/mL — ABNORMAL LOW (ref 0.30–0.70)

## 2017-06-11 LAB — THYROGLOBULIN ANTIBODY: Thyroglobulin Antibody: 3.1 IU/mL — ABNORMAL HIGH (ref 0.0–0.9)

## 2017-06-11 MED ORDER — CHLORHEXIDINE GLUCONATE CLOTH 2 % EX PADS
6.0000 | MEDICATED_PAD | Freq: Every day | CUTANEOUS | Status: DC
  Administered 2017-06-11 – 2017-06-14 (×2): 6 via TOPICAL

## 2017-06-11 MED ORDER — DEXAMETHASONE SODIUM PHOSPHATE 4 MG/ML IJ SOLN
2.0000 mg | Freq: Four times a day (QID) | INTRAMUSCULAR | Status: AC
  Administered 2017-06-11 (×3): 2 mg via INTRAVENOUS
  Filled 2017-06-11 (×3): qty 0.5

## 2017-06-11 MED ORDER — ATROPINE SULFATE 1 MG/10ML IJ SOSY
PREFILLED_SYRINGE | INTRAMUSCULAR | Status: AC
  Filled 2017-06-11: qty 10

## 2017-06-11 MED ORDER — PANTOPRAZOLE SODIUM 40 MG PO PACK
40.0000 mg | PACK | Freq: Two times a day (BID) | ORAL | Status: DC
  Administered 2017-06-11 – 2017-06-12 (×2): 40 mg
  Filled 2017-06-11 (×3): qty 20

## 2017-06-11 MED ORDER — LEVOTHYROXINE SODIUM 100 MCG PO TABS
100.0000 ug | ORAL_TABLET | Freq: Every day | ORAL | Status: DC
  Administered 2017-06-12 – 2017-06-23 (×10): 100 ug via ORAL
  Filled 2017-06-11 (×11): qty 1

## 2017-06-11 MED ORDER — SODIUM CHLORIDE 0.9% FLUSH: 10.0000 mL | INTRAVENOUS | Status: DC | PRN

## 2017-06-11 MED ORDER — HEPARIN (PORCINE) IN NACL 100-0.45 UNIT/ML-% IJ SOLN
1350.0000 [IU]/h | INTRAMUSCULAR | Status: DC
  Administered 2017-06-11: 1350 [IU]/h via INTRAVENOUS
  Filled 2017-06-11 (×4): qty 250

## 2017-06-11 MED ORDER — CHLORHEXIDINE GLUCONATE CLOTH 2 % EX PADS
6.0000 | MEDICATED_PAD | Freq: Every day | CUTANEOUS | Status: DC
  Administered 2017-06-13 – 2017-06-14 (×2): 6 via TOPICAL

## 2017-06-11 NOTE — Progress Notes (Signed)
ANTICOAGULATION CONSULT NOTE - Follow Up Consult  Pharmacy Consult for Heparin Indication: Hx of PE  Allergies  Allergen Reactions  . Allopurinol Nausea And Vomiting    Patient Measurements: Height: 5' 1" (154.9 cm) Weight: 178 lb 12.7 oz (81.1 kg) IBW/kg (Calculated) : 47.8 Heparin Dosing Weight: 63.3kg  Vital Signs: Temp: 98.4 F (36.9 C) (11/26 1138) Temp Source: Oral (11/26 1138) BP: 150/84 (11/26 1116) Pulse Rate: 63 (11/26 1200)  Labs: Recent Labs    06/09/17 0628 06/09/17 0844 06/09/17 1953 06/10/17 0416  06/10/17 1603 06/11/17 0249 06/11/17 1130  HGB 9.7*  --   --  9.6*  --   --  9.5*  --   HCT 30.3*  --   --  30.4*  --   --  29.6*  --   PLT 187  --   --  186  --   --  204  --   APTT 30 47* 84*  --   --   --   --   --   HEPARINUNFRC <0.10* 0.16* 0.24* 0.26*   < > <0.10* <0.10* 0.11*  CREATININE 0.67  --   --  0.63  --  0.55 0.60  --    < > = values in this interval not displayed.    Estimated Creatinine Clearance: 57.7 mL/min (by C-G formula based on SCr of 0.6 mg/dL).   Medications:  Scheduled:  . chlorhexidine gluconate (MEDLINE KIT)  15 mL Mouth Rinse BID  . Chlorhexidine Gluconate Cloth  6 each Topical Q0600  . dexamethasone  2 mg Intravenous Q6H  . feeding supplement (VITAL HIGH PROTEIN)  1,000 mL Per Tube Q24H  . insulin aspart  0-15 Units Subcutaneous Q4H  . levothyroxine  50 mcg Intravenous Daily  . mouth rinse  15 mL Mouth Rinse QID  . pantoprazole (PROTONIX) IV  40 mg Intravenous Q12H    Assessment: Amanda Short is a 76 YO F with a PMH of PE and DVT (2014). Admitted due to altered mental status and seizures en route to the hospital. Patient continues on heparin for PMH of PE while Xarelto is on hold. Last dose of Xarelto on 11/20. Heparin level earlier this morning was sub-therapeutic, however there was a problem with the line. CVC was inserted, however heparin level remained sub-therapeutic. CBC is stable and no signs of bleeding noted.   Goal of  Therapy:  Heparin level 0.3-0.7 units/ml Monitor platelets by anticoagulation protocol: Yes   Plan:  Increase heparin drip to 1350units/hr 8 hour heparin level to confirm Daily heparin level and CBC Monitor for signs/symptoms of bleeding  Geraldo Pitter  PharmD Candidate 06/11/2017,1:41 PM   Remigio Eisenmenger D. Mina Marble, PharmD, BCPS Pager:  [phone] 06/11/2017, 2:26 PM

## 2017-06-11 NOTE — Progress Notes (Signed)
[facility] Progress Note Patient Name: Kingslee Dowse DOB: Nov 06, 1940 MRN: [id number]   Date of Service  06/11/2017  HPI/Events of Note  Contacted by bedside nurse regarding restraints. Patient still intubated without a cuff leak per documentation.   eICU Interventions  Restraint renewal given for bilateral soft wrist restraints.      Intervention Category Intermediate Interventions: Other:  Tera Partridge 06/11/2017, 7:49 PM

## 2017-06-11 NOTE — Progress Notes (Signed)
[facility]  ADMISSION DATE:  06/06/2017  REFERRING MD:  Zenovia Jarred, M.D. / EDP   CHIEF COMPLAINT:  Status Epilepticus   Presenting HPI:  76 y.o. female with history of depression, DVT with pulmonary embolism, OSA, and diabetes mellitus type 2 presenting on 11/21 with altered mentation to the emergency department. Initially patient was called as a code stroke. In route to the emergency department she had a seizure and was given 2.5 mg of Versed. In the emergency department patient was noted to have recurrent seizure activity after returning from brain imaging. She received a total of 6 mg of Ativan and subsequently had altered level of consciousness in her postictal and sedated state. Patient was not protecting airway and was subsequently endotracheally intubated. Patient was admitted to the ICU on continuous EEG monitoring. Has no known history of seizures.  Subjective:    Awake, no distress, excellent tidal volumes spontaneous breathing trial effort   Temp:  [98.6 F (37 C)-99.5 F (37.5 C)] 98.6 F (37 C) (11/26 0821) Pulse Rate:  [57-92] 65 (11/26 1000) Resp:  [13-24] 17 (11/26 1000) BP: (114-185)/(60-104) 149/65 (11/26 1000) SpO2:  [96 %-100 %] 98 % (11/26 1000) FiO2 (%):  [30 %] 30 % (11/26 0808) Weight:  [178 lb 12.7 oz (81.1 kg)] 178 lb 12.7 oz (81.1 kg) (11/26 0442)  Vent Mode: PSV;CPAP FiO2 (%):  [30 %] 30 % Set Rate:  [14 bmp] 14 bmp Vt Set:  [380 mL] 380 mL PEEP:  [5 cmH20] 5 cmH20 Pressure Support:  [5 cmH20] 5 cmH20 Plateau Pressure:  [11 cmH20-12 cmH20] 12 cmH20   Intake/Output Summary (Last 24 hours) at 06/11/2017 1132 Last data filed at 06/11/2017 1000 Gross per 24 hour  Intake 1506.57 ml  Output 3435 ml  Net -1928.43 ml   General: 76 year old white female currently resting comfortably on pressure support ventilation no acute distress HEENT: Normocephalic atraumatic #4-1/9 endotracheal tube orally placed she has no air leak no  cuff leak when cuff deflated Pulmonary: Scattered rhonchi no accessory muscle use excellent tidal volumes Cardiac: Regular rate and rhythm without murmur rub or gallop Abdomen: Soft nontender no organomegaly Extremities/musculoskeletal: Warm dry no significant edema brisk cap refill no ecchymosis Neuro/psych: Awake oriented moves all extremities no focal deficits good strength  LINES/TUBES: OETT 11/21 >>> Foley 11/21 >>> OGT 11/21 >>> PIV  CBC Recent Labs    06/09/17 0628 06/10/17 0416 06/11/17 0249  WBC 11.6* 11.9* 12.6*  HGB 9.7* 9.6* 9.5*  HCT 30.3* 30.4* 29.6*  PLT 187 186 204    Coag's Recent Labs    06/09/17 0628 06/09/17 0844 06/09/17 1953  APTT 30 47* 84*    BMET Recent Labs    06/10/17 0416 06/10/17 1603 06/11/17 0249  NA 137 137 138  K 3.6 3.4* 3.9  CL 106 104 101  CO2 _0 BUN _1 CREATININE 0.63 0.55 0.60  GLUCOSE 173* 153* 188*    Electrolytes Recent Labs    06/08/17 1837  06/09/17 0628 06/10/17 0416 06/10/17 1603 06/11/17 0249  CALCIUM  --    < > 7.6* 7.7* 7.9* 7.9*  MG 2.0  --  1.8 1.5*  --   --   PHOS 3.3  --  2.9  2.9 2.8  --  2.9   < > = values in this interval not displayed.    Sepsis Markers No results for input(s): PROCALCITON, O2SATVEN in the last 72 hours.  Invalid input(s): LACTICACIDVEN  ABG No results for input(s): PHART, PCO2ART, PO2ART in the last 72 hours.  Liver Enzymes Recent Labs    06/09/17 0628 06/10/17 0416 06/11/17 0249  ALBUMIN 2.5* 2.5* 2.5*    Cardiac Enzymes No results for input(s): TROPONINI, PROBNP in the last 72 hours.  Glucose Recent Labs    06/10/17 1144 06/10/17 1704 06/10/17 1944 06/11/17 0018 06/11/17 0325 06/11/17 0820  GLUCAP 167* 144* 237* 189* 162* 200*    Imaging Mr Brain Wo Contrast  Result Date: 06/09/2017 CLINICAL DATA:  Focal neurologic deficit. EXAM: MRI HEAD WITHOUT CONTRAST TECHNIQUE: Multiplanar, multiecho pulse sequences of the brain and  surrounding structures were obtained without intravenous contrast. COMPARISON:  Head CT 06/06/2017 FINDINGS: Brain: The midline structures are normal. There is no acute infarct or acute hemorrhage. No mass lesion, hydrocephalus, dural abnormality or extra-axial collection. There is multifocal white matter hyperintensity suggesting chronic ischemic microangiopathy. Volume loss is mildly asymmetric with greatest atrophy in the left frontal and parietal lobes. No chronic microhemorrhage or superficial siderosis. Vascular: Major intracranial arterial and venous sinus flow voids are preserved. Skull and upper cervical spine: The visualized skull base, calvarium, upper cervical spine and extracranial soft tissues are normal. Sinuses/Orbits: Bilateral mastoid effusions. Paranasal sinuses are clear. Normal orbits. IMPRESSION: 1. No acute abnormality. 2. Chronic ischemic microangiopathy. 3. Advanced cerebral volume loss that is asymmetrically worse on the left to a mild degree. Electronically Signed   By: Ulyses Jarred M.D.   On: 06/09/2017 23:39   Dg Chest Port 1 View  Result Date: 06/10/2017 CLINICAL DATA:  Central line placement EXAM: PORTABLE CHEST 1 VIEW COMPARISON:  06/07/2017 FINDINGS: Endotracheal tube with tip measuring 4.8 cm above the carina. Enteric tube tip is off the field of view but below the left hemidiaphragm. Left central venous catheter is positioned over the confluence of the brachiocephalic vein and superior vena cava with tip directed sideways, perpendicular to the vessel wall. Mild cardiac enlargement. No vascular congestion or edema. No pneumothorax. Severe degenerative changes in the shoulders. IMPRESSION: Left central venous catheter projects over the confluence of the brachiocephalic vein and superior vena cava with tip directed laterally, possibly against the vascular side wall. Lungs are clear. No pneumothorax. Electronically Signed   By: Lucienne Capers M.D.   On: 06/10/2017 23:50      IMAGING/STUDIES: CT HEAD W/O 11/21:  No acute cortically based infarct or intracranial hemorrhage identified. ASPECTS is 10.  Increased chronic nonspecific cerebral volume loss since 2014. 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 PORT CXR 11/22:  Previously reviewed by me. Persistent silhouetting left hemidiaphragm suggestive of opacification versus consolidation. Endotracheal tube in good position. Enteric feeding tube coursing below diaphragm. EEG 11/23: This EEG is indicative of a severe diffuse encephalopathy, non-specific as to etiology. Sedating medication effect cannot be excluded. No epileptiform discharges, seizures, focal or lateralizing signs are seen. Compared to previous epoch, there is minimal improvement in the degree of encephalopathy. EEG 11/24:  This EEG is indicative of a variable diffuse encephalopathy, alternating between mild and awake to severe and comatose. Sedating medication effect is strongly suspected as the main driver. No epileptiform discharges, seizures, focal or lateralizing signs are seen.  MRI BRAIN W/O 11/24: IMPRESSION: 1. No acute abnormality. 2. Chronic ischemic microangiopathy. 3. Advanced cerebral volume loss that is asymmetrically worse on the left to a mild degree.  MICROBIOLOGY: MRSA PCR 11/21:  Negative   ANTIBIOTICS: None.  SIGNIFICANT EVENTS: 11/21 - Admit with status epilepticus  11/22 - Taken off  Propofol 11/24 - Following commands more consistently. Continuous EEG removed & MRI pending 11/25 - No cuff leak >> decadron & lasix  ASSESSMENT/PLAN:  76 y.o. female admitted with status epilepticus. MRI without obvious focal source. Hemoglobin stable on systemic and coagulation. Has no cuff leak this morning per inhibiting extubation.  Status epilepticus: New onset seizure disorder Plan Continue Keppra 750 mg IV twice daily will need outpatient neurology after discharge  Acute hypoxic respiratory failure: Suspect  retropharyngeal edema without cuff leak. -Still no cuff leak Plan Continue pressure support ventilation as tolerated PAD protocol goal RASS -1 Repeat chest x-ray a.m. Repeat Decadron dosing and reevaluate for extubation in the a.m. 11/27  acute encephalopathy: Resolving. Likely postictal state and complicated by sedation. Continuing Precedex infusion.   Plan Continue supportive care  OSA Plan Will resume home BiPAP following extubation  History of DVT/PE: Plan Holding Xarelto Continue heparin for now   Diabetes mellitus type 2:  Plan Sliding scale insulin protocol   hypothyroidism:  Plan Continuing Synthroid replacement  Essential hypertension: Plan Continue telemetry monitoring We will resume Spironolactone  Anemia: Possible gastritis/GI bleed. Plan Trend CBC continue PPI  My critical care time 32 minutes  Erick Colace ACNP-BC [place] Pager # [phone] OR # [phone] if no answer

## 2017-06-11 NOTE — Progress Notes (Signed)
[facility] for Heparin Indication: Hx of PE  Allergies  Allergen Reactions  . Allopurinol Nausea And Vomiting    Patient Measurements: Height: _0  (154.9 cm) Weight: 178 lb 12.7 oz (81.1 kg) IBW/kg (Calculated) : 47.8 Heparin Dosing Weight: 63.3kg  Vital Signs: Temp: 98.8 F (37.1 C) (11/26 2010) Temp Source: Oral (11/26 2010) BP: 128/82 (11/26 2300) Pulse Rate: 52 (11/26 2300)  Labs: Recent Labs    06/09/17 0628 06/09/17 0844 06/09/17 1953 06/10/17 0416  06/10/17 1603 06/11/17 0249 06/11/17 1130 06/11/17 2230  HGB 9.7*  --   --  9.6*  --   --  9.5*  --   --   HCT 30.3*  --   --  30.4*  --   --  29.6*  --   --   PLT 187  --   --  186  --   --  204  --   --   APTT 30 47* 84*  --   --   --   --   --   --   HEPARINUNFRC <0.10* 0.16* 0.24* 0.26*   < > <0.10* <0.10* 0.11* 0.33  CREATININE 0.67  --   --  0.63  --  0.55 0.60  --   --    < > = values in this interval not displayed.    Estimated Creatinine Clearance: 57.7 mL/min (by C-G formula based on SCr of 0.6 mg/dL).  Assessment: 76 y.o. female with h/o PE/DVT, Xarelto on hold, for heparin  Goal of Therapy:  Heparin level 0.3-0.7 units/ml Monitor platelets by anticoagulation protocol: Yes   Plan:  Continue Heparin at current rate   Phillis Knack, PharmD, BCPS

## 2017-06-12 ENCOUNTER — Inpatient Hospital Stay (HOSPITAL_COMMUNITY): Payer: Medicare Other

## 2017-06-12 DIAGNOSIS — J9611 Chronic respiratory failure with hypoxia: Secondary | ICD-10-CM

## 2017-06-12 DIAGNOSIS — J9601 Acute respiratory failure with hypoxia: Secondary | ICD-10-CM

## 2017-06-12 LAB — BASIC METABOLIC PANEL
Anion gap: 7 (ref 5–15)
BUN: 17 mg/dL (ref 6–20)
CHLORIDE: 102 mmol/L (ref 101–111)
CO2: 29 mmol/L (ref 22–32)
Calcium: 8.2 mg/dL — ABNORMAL LOW (ref 8.9–10.3)
Creatinine, Ser: 0.59 mg/dL (ref 0.44–1.00)
GFR calc Af Amer: >60 mL/min (ref >60)
GFR calc non Af Amer: >60 mL/min (ref >60)
Glucose, Bld: 222 mg/dL — ABNORMAL HIGH (ref 65–99)
POTASSIUM: 4 mmol/L (ref 3.5–5.1)
Sodium: 138 mmol/L (ref 135–145)

## 2017-06-12 LAB — CBC WITH DIFFERENTIAL/PLATELET
Basophils Absolute: 0 10*3/uL (ref 0.0–0.1)
Basophils Relative: 0 %
EOS ABS: 0.1 10*3/uL (ref 0.0–0.7)
Eosinophils Relative: 1 %
HCT: 28.4 % — ABNORMAL LOW (ref 36.0–46.0)
HEMOGLOBIN: 9.1 g/dL — AB (ref 12.0–15.0)
LYMPHS ABS: 1.2 10*3/uL (ref 0.7–4.0)
LYMPHS PCT: 10 %
MCH: 31.6 pg (ref 26.0–34.0)
MCHC: 32 g/dL (ref 30.0–36.0)
MCV: 98.6 fL (ref 78.0–100.0)
Monocytes Absolute: 1.1 10*3/uL — ABNORMAL HIGH (ref 0.1–1.0)
Monocytes Relative: 9 %
NEUTROS ABS: 9.2 10*3/uL — AB (ref 1.7–7.7)
NEUTROS PCT: 80 %
Platelets: 194 10*3/uL (ref 150–400)
RBC: 2.88 MIL/uL — AB (ref 3.87–5.11)
RDW: 14.2 % (ref 11.5–15.5)
WBC: 11.5 10*3/uL — AB (ref 4.0–10.5)

## 2017-06-12 LAB — GLUCOSE, CAPILLARY
GLUCOSE-CAPILLARY: 176 mg/dL — AB (ref 65–99)
GLUCOSE-CAPILLARY: 218 mg/dL — AB (ref 65–99)
GLUCOSE-CAPILLARY: 173 mg/dL — AB (ref 65–99)
Glucose-Capillary: 114 mg/dL — ABNORMAL HIGH (ref 65–99)
Glucose-Capillary: 149 mg/dL — ABNORMAL HIGH (ref 65–99)
Glucose-Capillary: 205 mg/dL — ABNORMAL HIGH (ref 65–99)

## 2017-06-12 LAB — POCT I-STAT 3, ART BLOOD GAS (G3+)
Acid-Base Excess: 6 mmol/L — ABNORMAL HIGH (ref 0.0–2.0)
Bicarbonate: 32.4 mmol/L — ABNORMAL HIGH (ref 20.0–28.0)
O2 Saturation: 100 %
PCO2 ART: 55.2 mmHg — AB (ref 32.0–48.0)
PH ART: 7.376 (ref 7.350–7.450)
PO2 ART: 283 mmHg — AB (ref 83.0–108.0)
TCO2: 34 mmol/L — ABNORMAL HIGH (ref 22–32)

## 2017-06-12 LAB — HEPARIN LEVEL (UNFRACTIONATED): Heparin Unfractionated: 0.48 IU/mL (ref 0.30–0.70)

## 2017-06-12 LAB — TRIGLYCERIDES: Triglycerides: 112 mg/dL (ref <150)

## 2017-06-12 MED ORDER — ETOMIDATE 2 MG/ML IV SOLN
0.3000 mg/kg | Freq: Once | INTRAVENOUS | Status: AC
  Administered 2017-06-12: 20 mg via INTRAVENOUS

## 2017-06-12 MED ORDER — PIPERACILLIN-TAZOBACTAM 3.375 G IVPB
3.3750 g | Freq: Three times a day (TID) | INTRAVENOUS | Status: DC
  Administered 2017-06-12 – 2017-06-19 (×21): 3.375 g via INTRAVENOUS
  Filled 2017-06-12 (×24): qty 50

## 2017-06-12 MED ORDER — PANTOPRAZOLE SODIUM 40 MG IV SOLR
40.0000 mg | Freq: Two times a day (BID) | INTRAVENOUS | Status: DC
  Administered 2017-06-12: 40 mg via INTRAVENOUS
  Filled 2017-06-12 (×2): qty 40

## 2017-06-12 MED ORDER — FENTANYL CITRATE (PF) 100 MCG/2ML IJ SOLN
INTRAMUSCULAR | Status: AC
  Filled 2017-06-12: qty 2

## 2017-06-12 MED ORDER — INSULIN ASPART 100 UNIT/ML ~~LOC~~ SOLN
0.0000 [IU] | SUBCUTANEOUS | Status: DC
Start: 2017-06-12 — End: 2017-06-18
  Administered 2017-06-12: 4 [IU] via SUBCUTANEOUS
  Administered 2017-06-12 (×2): 3 [IU] via SUBCUTANEOUS
  Administered 2017-06-13: 4 [IU] via SUBCUTANEOUS
  Administered 2017-06-13: 7 [IU] via SUBCUTANEOUS
  Administered 2017-06-13 – 2017-06-14 (×3): 4 [IU] via SUBCUTANEOUS
  Administered 2017-06-14: 3 [IU] via SUBCUTANEOUS
  Administered 2017-06-14: 4 [IU] via SUBCUTANEOUS
  Administered 2017-06-14 (×3): 3 [IU] via SUBCUTANEOUS
  Administered 2017-06-14 – 2017-06-18 (×7): 4 [IU] via SUBCUTANEOUS
  Administered 2017-06-18: 3 [IU] via SUBCUTANEOUS

## 2017-06-12 MED ORDER — FENTANYL CITRATE (PF) 100 MCG/2ML IJ SOLN
INTRAMUSCULAR | Status: AC
  Administered 2017-06-12: 100 ug
  Filled 2017-06-12: qty 2

## 2017-06-12 MED ORDER — ORAL CARE MOUTH RINSE: 15.0000 mL | Freq: Four times a day (QID) | OROMUCOSAL | Status: DC

## 2017-06-12 MED ORDER — FENTANYL CITRATE (PF) 100 MCG/2ML IJ SOLN
50.0000 ug | INTRAMUSCULAR | Status: DC | PRN
  Filled 2017-06-12: qty 2

## 2017-06-12 MED ORDER — LORAZEPAM 2 MG/ML IJ SOLN
0.5000 mg | INTRAMUSCULAR | Status: DC | PRN
  Administered 2017-06-12 – 2017-06-21 (×27): 1 mg via INTRAVENOUS
  Administered 2017-06-22 – 2017-06-23 (×6): 0.5 mg via INTRAVENOUS
  Filled 2017-06-12 (×34): qty 1

## 2017-06-12 MED ORDER — ROCURONIUM BROMIDE 50 MG/5ML IV SOLN
1.0000 mg/kg | Freq: Once | INTRAVENOUS | Status: AC
  Administered 2017-06-12: 50 mg via INTRAVENOUS

## 2017-06-12 MED ORDER — CHLORHEXIDINE GLUCONATE 0.12% ORAL RINSE (MEDLINE KIT): 15.0000 mL | Freq: Two times a day (BID) | OROMUCOSAL | Status: DC

## 2017-06-12 MED ORDER — MIDAZOLAM HCL 2 MG/2ML IJ SOLN
INTRAMUSCULAR | Status: AC
  Administered 2017-06-12: 2 mg
  Filled 2017-06-12: qty 2

## 2017-06-12 MED ORDER — VANCOMYCIN HCL IN DEXTROSE 750-5 MG/150ML-% IV SOLN
750.0000 mg | Freq: Two times a day (BID) | INTRAVENOUS | Status: DC
  Administered 2017-06-13 – 2017-06-15 (×5): 750 mg via INTRAVENOUS
  Filled 2017-06-12 (×7): qty 150

## 2017-06-12 MED ORDER — MIDAZOLAM HCL 2 MG/2ML IJ SOLN
INTRAMUSCULAR | Status: AC
  Filled 2017-06-12: qty 2

## 2017-06-12 MED ORDER — VANCOMYCIN HCL 10 G IV SOLR
1500.0000 mg | Freq: Once | INTRAVENOUS | Status: AC
  Administered 2017-06-12: 1500 mg via INTRAVENOUS
  Filled 2017-06-12: qty 1500

## 2017-06-12 MED ORDER — FENTANYL CITRATE (PF) 100 MCG/2ML IJ SOLN
50.0000 ug | INTRAMUSCULAR | Status: DC | PRN
  Administered 2017-06-12 – 2017-06-17 (×9): 50 ug via INTRAVENOUS
  Administered 2017-06-17: 25 ug via INTRAVENOUS
  Administered 2017-06-21: 50 ug via INTRAVENOUS
  Filled 2017-06-12 (×10): qty 2

## 2017-06-12 NOTE — Progress Notes (Signed)
Inpatient Diabetes Program Recommendations  AACE/ADA: New Consensus Statement on Inpatient Glycemic Control (2015)  Target Ranges:  Prepandial:   less than 140 mg/dL      Peak postprandial:   less than 180 mg/dL (1-2 hours)      Critically ill patients:  140 - 180 mg/dL   Results for Amanda Short, Amanda Short (MRN [id number]) as of 06/12/2017 08:39  Ref. Range 06/11/2017 08:20 06/11/2017 11:36 06/11/2017 15:38 06/11/2017 20:33 06/11/2017 23:50 06/12/2017 04:12 06/12/2017 08:14  Glucose-Capillary Latest Ref Range: 65 - 99 mg/dL 200 (H) 195 (H) 182 (H) 202 (H) 224 (H) 205 (H) 176 (H)   Review of Glycemic Control  Diabetes history: DM2 Outpatient Diabetes medications: Metformin 500 mg BID Current orders for Inpatient glycemic control: Novolog 0-15 units Q4H  Inpatient Diabetes Program Recommendations: Insulin -Tube Feeding Coverage: Please consider ordering Novolog 2 units Q4H for tube feeding coverage. If tube feeding is stopped or held then Novolog tube feeding coverage should also be stopped or held.   NOTE: In reviewing chart, noted patient was receiving Decadron 2 mg Q6H with last dose given on 06/11/17 at 23:18. Decadron contributing to hyperglycemia. No other steroids ordered at this time.  Thanks, Barnie Alderman, RN, MSN, CDE Diabetes Coordinator Inpatient Diabetes Program [phone] (Team Pager from 8am to 5pm)

## 2017-06-12 NOTE — Procedures (Addendum)
Extubation Procedure Note  Patient Details:   Name: Amanda Short DOB: Mar 08, 1941 MRN: [id number]   Airway Documentation:     Evaluation  O2 sats: stable throughout Complications: No apparent complications Patient did tolerate procedure well. Bilateral Breath Sounds: Rhonchi, Stridor   Pt extubated per MD order. Pt does have stridor but does not seem to be in any distress at this time, placed on 30L HFNC per MD order.  Ciro Backer 06/12/2017, 9:22 AM

## 2017-06-12 NOTE — Progress Notes (Signed)
Pharmacy Antibiotic Note  Amanda Short is a 76 y.o. female with a PMH of breast cancer, depression, GERD, HLD, HTN, hypothyroidism, PE (2014), T2DM. Patient was admitted on 06/06/2017 with altered mental status and seizure activity. Patient was extubated today, however immediately had stridor. CCM tried to re-intubate but was unsuccessful due to swollen airways. Emergent bedside tracheostomy was placed. Concern for PNA and Pharmacy has been consulted for Zosyn and vancomycin dosing.  Assessment: Prior to the event: Patient's WBC count remains stable and has remained afebrile. Patient's SCr remains WNL and stable, with 1L of UOP yesterday.   Plan: Initiate Zosyn 3.375g IV Q8H, 4 hr infusion Initiate Vancomycin 1573m IV loading dose Initiate Vancomycin 7537mIV Q12H Monitor WBC, Temp, CXR, SCr, UOP, S/sx of toxicity  Height: _0  (154.9 cm) Weight: 181 lb 3.5 oz (82.2 kg) IBW/kg (Calculated) : 47.8  Temp (24hrs), Avg:98.2 F (36.8 C), Min:97.6 F (36.4 C), Max:98.8 F (37.1 C)  Recent Labs  Lab 06/08/17 0247 06/09/17 0628 06/10/17 0416 06/10/17 1603 06/11/17 0249 06/12/17 0423  WBC 8.0 11.6* 11.9*  --  12.6* 11.5*  CREATININE 0.76 0.67 0.63 0.55 0.60 0.59    Estimated Creatinine Clearance: 58.2 mL/min (by C-G formula based on SCr of 0.59 mg/dL).    Allergies  Allergen Reactions  . Allopurinol Nausea And Vomiting    Antimicrobials this admission: 11/27 Zosyn >>  11/27 Vanc >>   Dose adjustments this admission: N/A  Microbiology results: 11/21 MRSA PCR: Negative  Thank you for allowing pharmacy to be a part of this patient's care.  SoGeraldo PitterPharmD Candidate 06/12/2017 1:01 PM

## 2017-06-12 NOTE — Progress Notes (Addendum)
ANTICOAGULATION CONSULT NOTE - Follow Up Consult  Pharmacy Consult for Heparin Indication: Hx of PE  Allergies  Allergen Reactions  . Allopurinol Nausea And Vomiting    Patient Measurements: Height: _0  (154.9 cm) Weight: 181 lb 3.5 oz (82.2 kg) IBW/kg (Calculated) : 47.8 Heparin Dosing Weight: 63.3kg  Vital Signs: Temp: 98.7 F (37.1 C) (11/27 1144) Temp Source: Oral (11/27 1144) BP: 100/82 (11/27 1300) Pulse Rate: 81 (11/27 1300)  Labs: Recent Labs    06/09/17 1953  06/10/17 0416  06/10/17 1603 06/11/17 0249 06/11/17 1130 06/11/17 2230 06/12/17 0423 06/12/17 0537  HGB  --    < > 9.6*  --   --  9.5*  --   --  9.1*  --   HCT  --   --  30.4*  --   --  29.6*  --   --  28.4*  --   PLT  --   --  186  --   --  204  --   --  194  --   APTT 84*  --   --   --   --   --   --   --   --   --   HEPARINUNFRC 0.24*  --  0.26*   < > <0.10* <0.10* 0.11* 0.33  --  0.48  CREATININE  --    < > 0.63  --  0.55 0.60  --   --  0.59  --    < > = values in this interval not displayed.    Estimated Creatinine Clearance: 58.2 mL/min (by C-G formula based on SCr of 0.59 mg/dL).   Medications:  Scheduled:  . chlorhexidine gluconate (MEDLINE KIT)  15 mL Mouth Rinse BID  . chlorhexidine gluconate (MEDLINE KIT)  15 mL Mouth Rinse BID  . Chlorhexidine Gluconate Cloth  6 each Topical Q0600  . Chlorhexidine Gluconate Cloth  6 each Topical Daily  . feeding supplement (VITAL HIGH PROTEIN)  1,000 mL Per Tube Q24H  . insulin aspart  0-20 Units Subcutaneous Q4H  . levothyroxine  100 mcg Oral QAC breakfast  . mouth rinse  15 mL Mouth Rinse QID  . mouth rinse  15 mL Mouth Rinse QID  . pantoprazole sodium  40 mg Per Tube BID    Assessment: Amanda Short is a 76 YO F with a PMH of PE and DVT (2014). Last dose of Xarelto on 11/20. Admitted due to altered mental status and seizures en route to the hospital. Patient's heparin level this morning was therapeutics. However, the heparin was stopped at 1019 due to  emergent trach. Heparin is to remain off until tomorrow morning per PCCM. Prior to the emergent trach CBC was stable. While there are no signs of current bleeding, there is a high risk of bleeding.   Goal of Therapy:  Heparin level 0.3-0.7 units/ml Monitor platelets by anticoagulation protocol: Yes   Plan:  F/U with resuming IV heparin in AM Daily CBC Monitor for signs/symptoms of bleeding  Geraldo Pitter  PharmD Candidate 06/12/2017,1:49 PM   Remigio Eisenmenger D. Mina Marble, PharmD, BCPS Pager:  [phone] 06/12/2017, 2:00 PM

## 2017-06-12 NOTE — Progress Notes (Addendum)
Extubated earlier today after passing spontaneous breathing trial and demonstrating active cuff leak.  She was extubated to heated high flow nasal cannula initially her phonation quality was poor with stridorous efforts however she denied dyspnea.  Not long following extubation pulse oximetry continued to drop as low as 60% with marked stridor upon arrival in the room.  Manual Ambu bag mask ventilation was applied we are able to improve her oxygenation easily to 100%.  The plan was to intubate and proceed with tracheostomy given failed extubation after 48 hours of systemic steroids.  Following administration of Versed, fentanyl, etomidate, and rocuronium although we are easily able to provide ventilation and oxygenation I was unable to pass endotracheal tube through vocal cords.  Had an excellent view of the vocal cords with a size 3 glide scope but was unable to pass #7 endotracheal tube due to marked swelling.  We attempted to pass a bougie however still were unable to get past the vocal cords her saturations began to drop to as low as 20% we again provided bag mask ventilation.  Her saturations and heart rate again improved to as high as mid 80s.  Dr. Nelda Marseille at that point was at bedside the decision was made to proceed with emergency bedside tracheostomy.  Percutaneous tracheostomy was placed at bedside emergently (see Dr. Pura Spice note).  Appropriate placement was confirmed via bedside bronchoscopy (see Dr. Matilde Bash note).  At time of dictation the patient remains sedated he is not yet responsive her saturations are 100% she is a bit hypertensive with systolic blood pressure in the 190s however otherwise hemodynamically stable.  She is now back on full ventilatory support.  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

## 2017-06-12 NOTE — Procedures (Signed)
Emergent Percutaneous Tracheostomy Placement  Done emergently without consent due to loss of airway.  Patient unresponsive due to severe anoxia.  Area cleaned.  Skin incision done followed by blunt dissection.  Trachea palpated then punctured, catheter passed.  Wire placed.  Catheter removed.  Airway then crushed and dilated.  Size 6 cuffed shiley trach placed and visualized bronchoscopically well above carina.  Good volume returns.  Sats and HR immediately improved.  Rush Farmer, M.D. [facility]monary/Critical Care Medicine. Pager: [phone]. After hours pager: [phone].

## 2017-06-12 NOTE — Progress Notes (Signed)
[facility]  ADMISSION DATE:  06/06/2017  REFERRING MD:  Zenovia Jarred, M.D. / EDP   CHIEF COMPLAINT:  Status Epilepticus   Presenting HPI:  76 y.o. female with history of depression, DVT with pulmonary embolism, OSA, and diabetes mellitus type 2 presenting on 11/21 with altered mentation to the emergency department. Initially patient was called as a code stroke. In route to the emergency department she had a seizure and was given 2.5 mg of Versed. In the emergency department patient was noted to have recurrent seizure activity after returning from brain imaging. She received a total of 6 mg of Ativan and subsequently had altered level of consciousness in her postictal and sedated state. Patient was not protecting airway and was subsequently endotracheally intubated. Patient was admitted to the ICU on continuous EEG monitoring. Has no known history of seizures.  Subjective:   Past spontaneous breathing trial so extubated   Temp:  [97.6 F (36.4 C)-98.8 F (37.1 C)] 97.8 F (36.6 C) (11/26 2340) Pulse Rate:  [50-81] 57 (11/27 0600) Resp:  [11-25] 13 (11/27 0600) BP: (113-150)/(51-104) 144/82 (11/27 0600) SpO2:  [96 %-100 %] 99 % (11/27 0600) FiO2 (%):  [30 %] 30 % (11/27 0842) Weight:  [181 lb 3.5 oz (82.2 kg)] 181 lb 3.5 oz (82.2 kg) (11/27 0436)  Vent Mode: CPAP;PSV FiO2 (%):  [30 %] 30 % Set Rate:  [14 bmp] 14 bmp Vt Set:  [380 mL] 380 mL PEEP:  [5 cmH20] 5 cmH20 Pressure Support:  [5 cmH20-8 cmH20] 5 cmH20 Plateau Pressure:  [11 cmH20] 11 cmH20   Intake/Output Summary (Last 24 hours) at 06/12/2017 1950 Last data filed at 06/12/2017 0600 Gross per 24 hour  Intake 5552.13 ml  Output 1020 ml  Net 4532.13 ml   General: 76 year old white female resting comfortably on pressure support ventilation HEENT: Small cuff leak noted with cuff deflated on endotracheal tube post extubation vocal quality poor with very hoarse weak phonation but strong  cough Pulmonary: Clear to auscultation without accessory muscle use Cardiac: Regular rate and rhythm without murmur rub or gallop Abdomen: Soft nontender no organomegaly Extremities/musculoskeletal: Warm, dry, good strength, no edema, strong pulses, brisk cap refill. Neuro/psych: Awake, oriented, moves all extremities, no focal deficits, appropriate affect.  LINES/TUBES: OETT 11/21 >>> 11/27 Foley 11/21 >>> 11/27 OGT 11/21 >>> 11/27 PIV  CBC Recent Labs    06/10/17 0416 06/11/17 0249 06/12/17 0423  WBC 11.9* 12.6* 11.5*  HGB 9.6* 9.5* 9.1*  HCT 30.4* 29.6* 28.4*  PLT 186 204 194    Coag's Recent Labs    06/09/17 1953  APTT 84*    BMET Recent Labs    06/10/17 1603 06/11/17 0249 06/12/17 0423  NA 137 138 138  K 3.4* 3.9 4.0  CL 104 101 102  CO2 _0 BUN _1 CREATININE 0.55 0.60 0.59  GLUCOSE 153* 188* 222*    Electrolytes Recent Labs    06/10/17 0416 06/10/17 1603 06/11/17 0249 06/12/17 0423  CALCIUM 7.7* 7.9* 7.9* 8.2*  MG 1.5*  --   --   --   PHOS 2.8  --  2.9  --     Sepsis Markers No results for input(s): PROCALCITON, O2SATVEN in the last 72 hours.  Invalid input(s): LACTICACIDVEN  ABG No results for input(s): PHART, PCO2ART, PO2ART in the last 72 hours.  Liver Enzymes Recent Labs    06/10/17 0416 06/11/17 0249  ALBUMIN 2.5* 2.5*    Cardiac Enzymes  No results for input(s): TROPONINI, PROBNP in the last 72 hours.  Glucose Recent Labs    06/11/17 1136 06/11/17 1538 06/11/17 2033 06/11/17 2350 06/12/17 0412 06/12/17 0814  GLUCAP 195* 182* 202* 224* 205* 176*    Imaging Dg Chest Port 1 View  Result Date: 06/10/2017 CLINICAL DATA:  Central line placement EXAM: PORTABLE CHEST 1 VIEW COMPARISON:  06/07/2017 FINDINGS: Endotracheal tube with tip measuring 4.8 cm above the carina. Enteric tube tip is off the field of view but below the left hemidiaphragm. Left central venous catheter is positioned over the confluence of  the brachiocephalic vein and superior vena cava with tip directed sideways, perpendicular to the vessel wall. Mild cardiac enlargement. No vascular congestion or edema. No pneumothorax. Severe degenerative changes in the shoulders. IMPRESSION: Left central venous catheter projects over the confluence of the brachiocephalic vein and superior vena cava with tip directed laterally, possibly against the vascular side wall. Lungs are clear. No pneumothorax. Electronically Signed   By: Lucienne Capers M.D.   On: 06/10/2017 23:50     IMAGING/STUDIES: CT HEAD W/O 11/21:  No acute cortically based infarct or intracranial hemorrhage identified. ASPECTS is 10.  Increased chronic nonspecific cerebral volume loss since 2014. 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 PORT CXR 11/22:  Previously reviewed by me. Persistent silhouetting left hemidiaphragm suggestive of opacification versus consolidation. Endotracheal tube in good position. Enteric feeding tube coursing below diaphragm. EEG 11/23: This EEG is indicative of a severe diffuse encephalopathy, non-specific as to etiology. Sedating medication effect cannot be excluded. No epileptiform discharges, seizures, focal or lateralizing signs are seen. Compared to previous epoch, there is minimal improvement in the degree of encephalopathy. EEG 11/24:  This EEG is indicative of a variable diffuse encephalopathy, alternating between mild and awake to severe and comatose. Sedating medication effect is strongly suspected as the main driver. No epileptiform discharges, seizures, focal or lateralizing signs are seen.  MRI BRAIN W/O 11/24: IMPRESSION: 1. No acute abnormality. 2. Chronic ischemic microangiopathy. 3. Advanced cerebral volume loss that is asymmetrically worse on the left to a mild degree.  MICROBIOLOGY: MRSA PCR 11/21:  Negative   ANTIBIOTICS: None.  SIGNIFICANT EVENTS: 11/21 - Admit with status epilepticus  11/22 - Taken off  Propofol 11/24 - Following commands more consistently. Continuous EEG removed & MRI pending 11/25 - No cuff leak >> decadron & lasix  ASSESSMENT/PLAN:  76 y.o. female admitted with status epilepticus. MRI without obvious focal source. Hemoglobin stable on systemic and coagulation.  Cuff leak present as of 11/27 therefore extubated  Status epilepticus: New onset seizure disorder Plan Continue on Keppra 750 mg twice daily will need outpatient neurology follow-up  Acute hypoxic respiratory failure: Suspect retropharyngeal edema without cuff leak. Cuff leak small but present Excellent frequency tidal volume ratio Plan Extubate to heated high flow Continue pulse oximetry Vocal rest Wean oxygen for saturations greater than 92%  acute encephalopathy: This is resolved  plan Supportive care  History of depression/anxiety Plan Resume Cymbalta  OSA Plan AutoSet CPAP at at bedtime  History of DVT/PE: Plan Holding Xarelto for another 24 hours status post extubation, continuing heparin If no issues over the next 24 hours can resume Xarelto  Diabetes mellitus type 2:  Plan Continuing sliding scale insulin,  hypothyroidism:  Plan Continue Synthroid replacement  Essential hypertension: Plan Continue spironolactone Resume metoprolol  Anemia: Possible gastritis/GI bleed. Hemoglobin remained stable no new evidence of bleeding suspect this is transient from nasogastric tube placement for intermittent suction  has now resolved Plan Continue PPI Repeat CBC in 48 hours  My critical care time is 40 minutes Erick Colace ACNP-BC [place] Pager # [phone] OR # [phone] if no answer

## 2017-06-12 NOTE — Progress Notes (Signed)
Code Blue respiratory distress. Patient was to be intubated but due to complications, not able to do it.  Had to do emergency tracheotomy. Husband was down hall and knew something was going on and was upset. Called his son and they connected.  Had prayer with husband while waiting for things to happen. Was able to wait and go to see patient after cleaned up and pray with her and her husband,  Husband would like more support and requested chaplain come back by if possible because this is hard.  Waiting for patient to wake up and to find out how long she was without oxygen and if there is any brain damage.  Family very concerned. Conard Novak, Chaplain

## 2017-06-12 NOTE — Progress Notes (Signed)
Patient was extubated at 0905 and immediately had stridor. HFNC At 30KL was applied and patient continued to decline. CCM tried to intubate with multiple providers but airway was extremely swollen. Emergent trach is being tried at beside. Will continue to monitor. Modena Morrow E, RN 06/12/2017 9:47 AM

## 2017-06-13 ENCOUNTER — Inpatient Hospital Stay (HOSPITAL_COMMUNITY): Payer: Medicare Other

## 2017-06-13 LAB — GLUCOSE, CAPILLARY
GLUCOSE-CAPILLARY: 97 mg/dL (ref 65–99)
GLUCOSE-CAPILLARY: 153 mg/dL — AB (ref 65–99)
GLUCOSE-CAPILLARY: 193 mg/dL — AB (ref 65–99)
GLUCOSE-CAPILLARY: 204 mg/dL — AB (ref 65–99)
Glucose-Capillary: 145 mg/dL — ABNORMAL HIGH (ref 65–99)

## 2017-06-13 LAB — CBC WITH DIFFERENTIAL/PLATELET
BASOS ABS: 0 10*3/uL (ref 0.0–0.1)
Basophils Relative: 0 %
Eosinophils Absolute: 0.2 10*3/uL (ref 0.0–0.7)
Eosinophils Relative: 3 %
HEMATOCRIT: 22.8 % — AB (ref 36.0–46.0)
HEMOGLOBIN: 7.2 g/dL — AB (ref 12.0–15.0)
LYMPHS PCT: 15 %
Lymphs Abs: 1.1 10*3/uL (ref 0.7–4.0)
MCH: 31.7 pg (ref 26.0–34.0)
MCHC: 31.6 g/dL (ref 30.0–36.0)
MCV: 100.4 fL — AB (ref 78.0–100.0)
MONO ABS: 1 10*3/uL (ref 0.1–1.0)
MONOS PCT: 13 %
NEUTROS ABS: 5.4 10*3/uL (ref 1.7–7.7)
NEUTROS PCT: 69 %
Platelets: 211 10*3/uL (ref 150–400)
RBC: 2.27 MIL/uL — ABNORMAL LOW (ref 3.87–5.11)
RDW: 14.5 % (ref 11.5–15.5)
WBC: 7.8 10*3/uL (ref 4.0–10.5)

## 2017-06-13 LAB — HEPARIN LEVEL (UNFRACTIONATED): Heparin Unfractionated: 0.37 IU/mL (ref 0.30–0.70)

## 2017-06-13 LAB — PROCALCITONIN: Procalcitonin: 0.27 ng/mL

## 2017-06-13 MED ORDER — HEPARIN (PORCINE) IN NACL 100-0.45 UNIT/ML-% IJ SOLN
1150.0000 [IU]/h | INTRAMUSCULAR | Status: DC
  Administered 2017-06-13 – 2017-06-14 (×2): 1350 [IU]/h via INTRAVENOUS
  Administered 2017-06-15: 1400 [IU]/h via INTRAVENOUS
  Administered 2017-06-18: 1300 [IU]/h via INTRAVENOUS
  Filled 2017-06-13 (×10): qty 250

## 2017-06-13 MED ORDER — ALBUTEROL SULFATE (2.5 MG/3ML) 0.083% IN NEBU
2.5000 mg | INHALATION_SOLUTION | Freq: Four times a day (QID) | RESPIRATORY_TRACT | Status: DC
  Administered 2017-06-13 – 2017-06-22 (×38): 2.5 mg via RESPIRATORY_TRACT
  Filled 2017-06-13 (×38): qty 3

## 2017-06-13 MED ORDER — ALBUTEROL SULFATE (2.5 MG/3ML) 0.083% IN NEBU
INHALATION_SOLUTION | RESPIRATORY_TRACT | Status: AC
  Filled 2017-06-13: qty 3

## 2017-06-13 MED ORDER — INSULIN ASPART 100 UNIT/ML ~~LOC~~ SOLN
4.0000 [IU] | SUBCUTANEOUS | Status: DC
  Administered 2017-06-13 – 2017-06-18 (×18): 4 [IU] via SUBCUTANEOUS

## 2017-06-13 MED ORDER — PANTOPRAZOLE SODIUM 40 MG PO PACK
40.0000 mg | PACK | Freq: Every day | ORAL | Status: DC
  Administered 2017-06-13 – 2017-06-15 (×3): 40 mg
  Filled 2017-06-13 (×4): qty 20

## 2017-06-13 MED ORDER — ETOMIDATE 2 MG/ML IV SOLN
15.0000 mg | Freq: Once | INTRAVENOUS | Status: AC
  Administered 2017-06-13: 15 mg via INTRAVENOUS
  Filled 2017-06-13: qty 10

## 2017-06-13 NOTE — Progress Notes (Signed)
Cortrak Tube Team Note:  Consult received to place a Cortrak feeding tube.   A 10 F Cortrak tube was placed in the left nare and secured with a nasal bridle at 69 cm. Per the Cortrak monitor reading the tube tip is post-pyloric.   No x-ray is required. RN may begin using tube.   If the tube becomes dislodged please keep the tube and contact the Cortrak team at [email or web address] (password [email or web address]) for replacement.  If after hours and replacement cannot be delayed, place a NG tube and confirm placement with an abdominal x-ray.    Koleen Distance MS, RD, LDN Pager #- [phone] After Hours Pager: [phone]

## 2017-06-13 NOTE — Progress Notes (Addendum)
Patient in her bed awake and alert. Patient still in emotional distress. Husband on-site communicating with her on communication board. Both patient and husband happy about patient's improvement and very appreciative of chaplain's visit. Chaplain provided compassionate presence. Patient wants more visits and chaplain to revisit later if patient's family will still be around.  Amanda Short. [facility]- chaplain

## 2017-06-13 NOTE — Progress Notes (Signed)
Patient placed on 28% ATC without complications. Vital signs stable throughout. Patient is tolerating well at this time. [name]

## 2017-06-13 NOTE — Progress Notes (Signed)
Patient placed on full support for core track placement. RT will attempt to wean after procedure. [name]

## 2017-06-13 NOTE — Progress Notes (Signed)
[facility]  ADMISSION DATE:  06/06/2017  REFERRING MD:  Zenovia Jarred, M.D. / EDP   CHIEF COMPLAINT:  Status Epilepticus   Presenting HPI:  76 y.o. female with history of depression, DVT with pulmonary embolism, OSA, and diabetes mellitus type 2 presenting on 11/21 with altered mentation to the emergency department. Initially patient was called as a code stroke. In route to the emergency department she had a seizure and was given 2.5 mg of Versed. In the emergency department patient was noted to have recurrent seizure activity after returning from brain imaging. She received a total of 6 mg of Ativan and subsequently had altered level of consciousness in her postictal and sedated state. Patient was not protecting airway and was subsequently endotracheally intubated. Patient was admitted to the ICU on continuous EEG monitoring. Has no known history of seizures.  Subjective:   No distress.  Does report some anxiety   Temp:  [98.7 F (37.1 C)-99.3 F (37.4 C)] 98.7 F (37.1 C) (11/28 0810) Pulse Rate:  [70-119] 80 (11/28 0846) Resp:  [0-25] 18 (11/28 0846) BP: (90-214)/(60-148) 157/75 (11/28 0846) SpO2:  [96 %-100 %] 100 % (11/28 0800) FiO2 (%):  [40 %-100 %] 40 % (11/28 0846) Weight:  [187 lb 13.3 oz (85.2 kg)] 187 lb 13.3 oz (85.2 kg) (11/28 0500)  Vent Mode: PRVC FiO2 (%):  [40 %-100 %] 40 % Set Rate:  [14 bmp] 14 bmp Vt Set:  [380 mL] 380 mL PEEP:  [5 cmH20] 5 cmH20 Plateau Pressure:  [11 cmH20-35 cmH20] 11 cmH20   Intake/Output Summary (Last 24 hours) at 06/13/2017 0847 Last data filed at 06/13/2017 0521 Gross per 24 hour  Intake 590 ml  Output 810 ml  Net -220 ml   General: This is a 76 year old white female currently resting comfortably on pressure support ventilation HEENT normocephalic atraumatic.  She has a #6 cuffed trach in place with minimal drainage Pulmonary: Scattered rhonchi excellent tidal volume on pressure support of 5 no accessory  muscle use Cardiac: Regular rate and rhythm without murmur rub or gallop Abdomen: Soft nontender no organomegaly positive bowel sounds Extremities/musculoskeletal: Scattered areas of ecchymosis.  Dependent edema present currently.  She is warm dry has good pulses. Neuro/psych: Awake appropriate moves all extremities GU: Clear yellow urine  LINES/TUBES: OETT 11/21 >>> 11/27 Foley 11/21 >>> 11/27 OGT 11/21 >>> 11/27 PIV  CBC Recent Labs    06/11/17 0249 06/12/17 0423 06/13/17 0549  WBC 12.6* 11.5* 7.8  HGB 9.5* 9.1* 7.2*  HCT 29.6* 28.4* 22.8*  PLT 204 194 211    Coag's No results for input(s): APTT, INR in the last 72 hours.  BMET Recent Labs    06/10/17 1603 06/11/17 0249 06/12/17 0423  NA 137 138 138  K 3.4* 3.9 4.0  CL 104 101 102  CO2 _0 BUN _1 CREATININE 0.55 0.60 0.59  GLUCOSE 153* 188* 222*    Electrolytes Recent Labs    06/10/17 1603 06/11/17 0249 06/12/17 0423  CALCIUM 7.9* 7.9* 8.2*  PHOS  --  2.9  --     Sepsis Markers No results for input(s): PROCALCITON, O2SATVEN in the last 72 hours.  Invalid input(s): LACTICACIDVEN  ABG Recent Labs    06/12/17 1211  PHART 7.376  PCO2ART 55.2*  PO2ART 283.0*    Liver Enzymes Recent Labs    06/11/17 0249  ALBUMIN 2.5*    Cardiac Enzymes No results for input(s): TROPONINI, PROBNP in the  last 72 hours.  Glucose Recent Labs    06/12/17 1143 06/12/17 1603 06/12/17 2003 06/12/17 2349 06/13/17 0430 06/13/17 0807  GLUCAP 218* 173* 149* 114* 145* 97    Imaging Dg Chest Port 1 View  Result Date: 06/12/2017 CLINICAL DATA:  Tracheostomy tube placement EXAM: PORTABLE CHEST 1 VIEW COMPARISON:  06/10/2017 FINDINGS: Tracheostomy tube with the tip 4.5 cm above the carina. Left jugular central venous catheter at the confluence of the brachiocephalic vein and SVC. Small bilateral pleural effusions. Mild right basilar airspace disease likely reflecting atelectasis. No pneumothorax.  Stable cardiomediastinal silhouette. No acute osseous abnormality. Severe osteoarthritis of bilateral glenohumeral joints. IMPRESSION: 1. Tracheostomy tube with the tip 4.5 cm above the carina. 2. Left jugular central venous catheter at the confluence of the brachiocephalic vein and SVC. Electronically Signed   By: Kathreen Devoid   On: 06/12/2017 10:48     IMAGING/STUDIES: CT HEAD W/O 11/21:  No acute cortically based infarct or intracranial hemorrhage identified. ASPECTS is 10.  Increased chronic nonspecific cerebral volume loss since 2014. 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 PORT CXR 11/22:  Previously reviewed by me. Persistent silhouetting left hemidiaphragm suggestive of opacification versus consolidation. Endotracheal tube in good position. Enteric feeding tube coursing below diaphragm. EEG 11/23: This EEG is indicative of a severe diffuse encephalopathy, non-specific as to etiology. Sedating medication effect cannot be excluded. No epileptiform discharges, seizures, focal or lateralizing signs are seen. Compared to previous epoch, there is minimal improvement in the degree of encephalopathy. EEG 11/24:  This EEG is indicative of a variable diffuse encephalopathy, alternating between mild and awake to severe and comatose. Sedating medication effect is strongly suspected as the main driver. No epileptiform discharges, seizures, focal or lateralizing signs are seen.  MRI BRAIN W/O 11/24: IMPRESSION: 1. No acute abnormality. 2. Chronic ischemic microangiopathy. 3. Advanced cerebral volume loss that is asymmetrically worse on the left to a mild degree.  MICROBIOLOGY: MRSA PCR 11/21:  Negative  Sputum 11/28>>> ANTIBIOTICS: Vancomycin 11/27>>> Zosyn 11/27>>> SIGNIFICANT EVENTS: 11/21 - Admit with status epilepticus  11/22 - Taken off Propofol 11/24 - Following commands more consistently. Continuous EEG removed & MRI pending 11/25 - No cuff leak >> decadron &  lasix  ASSESSMENT/PLAN:  76 y.o. female admitted with status epilepticus. MRI without obvious focal source. Hemoglobin stable on systemic and coagulation.  Status post emergent tracheostomy.  Bite of a rough day yesterday she looks fantastic.  We will get her out of bed today, initiate trach collar trials, will need a feeding tube placed today for nutritional needs.  She has some purulent sputum so we will check sputum culture although this may be low yield as she is already on antibiotics  Status epilepticus: New onset seizure disorder Plan Continue Keppra at current dosing she will need neurology follow-up  Acute hypoxic respiratory failure: Suspect retropharyngeal edema without cuff leak. Vocal cord edema status post emergent tracheostomy 11/27 following failed extubation attempt -She is fully awake and oriented, ventilator mechanics are excellent -Minimal drainage from tracheostomy site Plan Initiate aerosol trach collar trials, if she does well with this today we will ask speech language pathology to see her tomorrow and initiate Passy-Muir valve trials Will get her out of bed today Add bronchodilators  Right lower lobe atelectasis versus aspiration pneumonia Plan Sputum culture Procalcitonin algorithm Day #2 vancomycin and Zosyn  History of depression/anxiety Plan Resume Cymbalta when can take POs PRN Ativan  Protein calorie malnutrition Plan Start tube feeds again  OSA Plan Now has trach  History of DVT/PE: Plan Heparin drip  Diabetes mellitus type 2:  Plan Sliding scale insulin, will add basal short acting dosing as we add tube feeds back.  He had significant hyperglycemia for  hypothyroidism:  Plan Synthroid  Essential hypertension: Plan Continue Spironolactone and metoprolol Continue telemetry monitoring  Anemia: Possible gastritis/GI bleed. Hemoglobin did drop down to 7.2, she had a fairly traumatic tracheostomy placement with some blood loss as she  was still on heparin time given its emergent nature.  Currently no further evidence of bleeding Plan Trend CBC Continue IV heparin Likely initiate Xarelto again once off ventilator  I have updated the patient and her husband who is at bedside on plan of care My critical care time 45 minutes; we can transfer her to vent/stepdown tomorrow if no issues   Erick Colace ACNP-BC [place] Pager # [phone] OR # [phone] if no answer

## 2017-06-13 NOTE — Progress Notes (Signed)
ANTICOAGULATION CONSULT NOTE - Follow Up Consult  Pharmacy Consult for Heparin Indication: Hx of PE  Allergies  Allergen Reactions  . Allopurinol Nausea And Vomiting    Patient Measurements: Height: _0  (154.9 cm) Weight: 187 lb 13.3 oz (85.2 kg) IBW/kg (Calculated) : 47.8 Heparin Dosing Weight: 63.3kg  Vital Signs: Temp: 98.7 F (37.1 C) (11/28 0810) Temp Source: Oral (11/28 0810) BP: 158/136 (11/28 1000) Pulse Rate: 97 (11/28 1000)  Labs: Recent Labs    06/10/17 1603  06/11/17 0249 06/11/17 1130 06/11/17 2230 06/12/17 0423 06/12/17 0537 06/13/17 0549  HGB  --    < > 9.5*  --   --  9.1*  --  7.2*  HCT  --   --  29.6*  --   --  28.4*  --  22.8*  PLT  --   --  204  --   --  194  --  211  HEPARINUNFRC <0.10*  --  <0.10* 0.11* 0.33  --  0.48  --   CREATININE 0.55  --  0.60  --   --  0.59  --   --    < > = values in this interval not displayed.    Estimated Creatinine Clearance: 59.3 mL/min (by C-G formula based on SCr of 0.59 mg/dL).   Medications:  Scheduled:  . albuterol  2.5 mg Nebulization Q6H  . albuterol      . chlorhexidine gluconate (MEDLINE KIT)  15 mL Mouth Rinse BID  . Chlorhexidine Gluconate Cloth  6 each Topical Q0600  . Chlorhexidine Gluconate Cloth  6 each Topical Daily  . feeding supplement (VITAL HIGH PROTEIN)  1,000 mL Per Tube Q24H  . insulin aspart  0-20 Units Subcutaneous Q4H  . insulin aspart  4 Units Subcutaneous Q4H  . levothyroxine  100 mcg Oral QAC breakfast  . mouth rinse  15 mL Mouth Rinse QID  . pantoprazole sodium  40 mg Per Tube Q1200    Assessment: AH is a 76 YO F with a PMH of PE and DVT (2014). Last dose of Xarelto on 11/20. Admitted due to altered mental status and seizures en route to the hospital. Heparin was stopped on 11/27 @ 1019 due to emergent trach. Heparin to be restarted this morning at prior infusion rate per PCCM. No signs of current bleeding. Hgb trending down, however platelets remain stable.   Goal of  Therapy:  Heparin level 0.3-0.7 units/ml Monitor platelets by anticoagulation protocol: Yes   Plan:  Re-start heparin 1350units/hr IV continuous Check HL in 8 hours Daily CBC and HL Monitor for signs/symptoms of bleeding  Geraldo Pitter  PharmD Candidate 06/13/2017,10:22 AM

## 2017-06-13 NOTE — Progress Notes (Signed)
ANTICOAGULATION CONSULT NOTE - Follow Up Consult  Pharmacy Consult for Heparin Indication: Hx of PE  Allergies  Allergen Reactions  . Allopurinol Nausea And Vomiting    Patient Measurements: Height: 5' 1" (154.9 cm) Weight: 187 lb 13.3 oz (85.2 kg) IBW/kg (Calculated) : 47.8 Heparin Dosing Weight: 63.3kg  Vital Signs: Temp: 98.4 F (36.9 C) (11/28 1606) Temp Source: Oral (11/28 1606) BP: 152/83 (11/28 1900) Pulse Rate: 99 (11/28 1900)  Labs: Recent Labs    06/11/17 0249  06/11/17 2230 06/12/17 0423 06/12/17 0537 06/13/17 0549 06/13/17 1841  HGB 9.5*  --   --  9.1*  --  7.2*  --   HCT 29.6*  --   --  28.4*  --  22.8*  --   PLT 204  --   --  194  --  211  --   HEPARINUNFRC <0.10*   < > 0.33  --  0.48  --  0.37  CREATININE 0.60  --   --  0.59  --   --   --    < > = values in this interval not displayed.    Estimated Creatinine Clearance: 59.3 mL/min (by C-G formula based on SCr of 0.59 mg/dL).   Medications:  Scheduled:  . albuterol  2.5 mg Nebulization Q6H  . albuterol      . chlorhexidine gluconate (MEDLINE KIT)  15 mL Mouth Rinse BID  . Chlorhexidine Gluconate Cloth  6 each Topical Q0600  . Chlorhexidine Gluconate Cloth  6 each Topical Daily  . feeding supplement (VITAL HIGH PROTEIN)  1,000 mL Per Tube Q24H  . insulin aspart  0-20 Units Subcutaneous Q4H  . insulin aspart  4 Units Subcutaneous Q4H  . levothyroxine  100 mcg Oral QAC breakfast  . mouth rinse  15 mL Mouth Rinse QID  . pantoprazole sodium  40 mg Per Tube Q1200    Assessment: Amanda Short is a 76 YO F with a PMH of PE and DVT (2014). Last dose of Xarelto on 11/20. Admitted due to altered mental status and seizures en route to the hospital. Patient was on IV heparin but it had been on hold yesterday for emergent trach. Heparin was restarted this morning. Heparin level = 0.37, therapeutic on 1350 units/hr  Goal of Therapy:  Heparin level 0.3-0.7 units/ml Monitor platelets by anticoagulation protocol:  Yes   Plan:  Continue heparin infusion at current rate F/u AM labs. Monitor for signs/symptoms of bleeding  Amanda Short, PharmD, BCPS  Clinical Pharmacist  Pager: [phone]   06/13/2017, 7:23 PM

## 2017-06-14 ENCOUNTER — Inpatient Hospital Stay (HOSPITAL_COMMUNITY): Payer: Medicare Other

## 2017-06-14 LAB — BASIC METABOLIC PANEL
Anion gap: 7 (ref 5–15)
BUN: 9 mg/dL (ref 6–20)
CHLORIDE: 103 mmol/L (ref 101–111)
CO2: 30 mmol/L (ref 22–32)
CREATININE: 0.58 mg/dL (ref 0.44–1.00)
Calcium: 8.2 mg/dL — ABNORMAL LOW (ref 8.9–10.3)
GFR calc Af Amer: >60 mL/min (ref >60)
GFR calc non Af Amer: >60 mL/min (ref >60)
GLUCOSE: 163 mg/dL — AB (ref 65–99)
POTASSIUM: 3.8 mmol/L (ref 3.5–5.1)
Sodium: 140 mmol/L (ref 135–145)

## 2017-06-14 LAB — CBC
HCT: 26.3 % — ABNORMAL LOW (ref 36.0–46.0)
HEMOGLOBIN: 8.4 g/dL — AB (ref 12.0–15.0)
MCH: 31.5 pg (ref 26.0–34.0)
MCHC: 31.9 g/dL (ref 30.0–36.0)
MCV: 98.5 fL (ref 78.0–100.0)
PLATELETS: 267 10*3/uL (ref 150–400)
RBC: 2.67 MIL/uL — ABNORMAL LOW (ref 3.87–5.11)
RDW: 14.1 % (ref 11.5–15.5)
WBC: 10.8 10*3/uL — ABNORMAL HIGH (ref 4.0–10.5)

## 2017-06-14 LAB — GLUCOSE, CAPILLARY
GLUCOSE-CAPILLARY: 144 mg/dL — AB (ref 65–99)
GLUCOSE-CAPILLARY: 168 mg/dL — AB (ref 65–99)
GLUCOSE-CAPILLARY: 166 mg/dL — AB (ref 65–99)
GLUCOSE-CAPILLARY: 131 mg/dL — AB (ref 65–99)
GLUCOSE-CAPILLARY: 163 mg/dL — AB (ref 65–99)
Glucose-Capillary: 141 mg/dL — ABNORMAL HIGH (ref 65–99)
Glucose-Capillary: 147 mg/dL — ABNORMAL HIGH (ref 65–99)

## 2017-06-14 LAB — COMPREHENSIVE METABOLIC PANEL
ALT: 33 U/L (ref 14–54)
ANION GAP: 8 (ref 5–15)
AST: 20 U/L (ref 15–41)
Albumin: 2.1 g/dL — ABNORMAL LOW (ref 3.5–5.0)
Alkaline Phosphatase: 101 U/L (ref 38–126)
BUN: 11 mg/dL (ref 6–20)
CALCIUM: 7.9 mg/dL — AB (ref 8.9–10.3)
CO2: 30 mmol/L (ref 22–32)
CREATININE: 0.63 mg/dL (ref 0.44–1.00)
Chloride: 101 mmol/L (ref 101–111)
GLUCOSE: 183 mg/dL — AB (ref 65–99)
Potassium: 2.7 mmol/L — CL (ref 3.5–5.1)
Sodium: 139 mmol/L (ref 135–145)
TOTAL PROTEIN: 4.6 g/dL — AB (ref 6.5–8.1)
Total Bilirubin: 0.5 mg/dL (ref 0.3–1.2)

## 2017-06-14 LAB — HEPARIN LEVEL (UNFRACTIONATED): HEPARIN UNFRACTIONATED: 0.34 [IU]/mL (ref 0.30–0.70)

## 2017-06-14 LAB — PROCALCITONIN: PROCALCITONIN: 0.2 ng/mL

## 2017-06-14 MED ORDER — POTASSIUM CHLORIDE 20 MEQ/15ML (10%) PO SOLN
40.0000 meq | Freq: Once | ORAL | Status: AC
  Administered 2017-06-14: 40 meq
  Filled 2017-06-14: qty 30

## 2017-06-14 MED ORDER — CHLORHEXIDINE GLUCONATE 0.12 % MT SOLN
15.0000 mL | Freq: Two times a day (BID) | OROMUCOSAL | Status: DC
  Administered 2017-06-14 – 2017-06-23 (×16): 15 mL via OROMUCOSAL
  Filled 2017-06-14 (×14): qty 15

## 2017-06-14 MED ORDER — ACETAMINOPHEN 160 MG/5ML PO SOLN
650.0000 mg | ORAL | Status: DC | PRN
  Administered 2017-06-14 – 2017-06-15 (×3): 650 mg
  Filled 2017-06-14 (×3): qty 20.3

## 2017-06-14 MED ORDER — ORAL CARE MOUTH RINSE
15.0000 mL | Freq: Two times a day (BID) | OROMUCOSAL | Status: DC
  Administered 2017-06-15 – 2017-06-22 (×10): 15 mL via OROMUCOSAL

## 2017-06-14 MED ORDER — SPIRONOLACTONE 25 MG PO TABS
50.0000 mg | ORAL_TABLET | Freq: Every day | ORAL | Status: DC
  Administered 2017-06-14 – 2017-06-23 (×9): 50 mg via ORAL
  Filled 2017-06-14: qty 1
  Filled 2017-06-14 (×9): qty 2

## 2017-06-14 MED ORDER — POTASSIUM CHLORIDE 10 MEQ/50ML IV SOLN
10.0000 meq | INTRAVENOUS | Status: AC
  Administered 2017-06-14 (×6): 10 meq via INTRAVENOUS
  Filled 2017-06-14 (×6): qty 50

## 2017-06-14 MED ORDER — METOPROLOL SUCCINATE ER 100 MG PO TB24
100.0000 mg | ORAL_TABLET | Freq: Every day | ORAL | Status: DC
  Administered 2017-06-15 – 2017-06-23 (×8): 100 mg via ORAL
  Filled 2017-06-14 (×9): qty 1

## 2017-06-14 MED ORDER — METOPROLOL SUCCINATE ER 50 MG PO TB24: 50.0000 mg | ORAL_TABLET | Freq: Every day | ORAL | Status: DC

## 2017-06-14 NOTE — Care Management Note (Signed)
Case Management Note  Patient Details  Name: Daleiza Bacchi MRN: [id number] Date of Birth: [birth year]  Subjective/Objective:     Pt admitted with seizures               Action/Plan:  PTA independent from home with husband.  Pt had to be emergently trached 11/27 post failing extubation.  Pt is already on TC 11/29 and per PCCM will likely wean very quickly.  Tentative discharge plan will be SNF - CM will continue to follow for discharge needs   Expected Discharge Date:                  Expected Discharge Plan:     In-House Referral:     Discharge planning Services     Post Acute Care Choice:    Choice offered to:     DME Arranged:    DME Agency:     HH Arranged:    HH Agency:     Status of Service:     If discussed at [employer] of Stay Meetings, dates discussed:    Additional Comments:  Maryclare Labrador, RN 06/14/2017, 5:41 PM

## 2017-06-14 NOTE — Progress Notes (Addendum)
[facility]  ADMISSION DATE:  06/06/2017  REFERRING MD:  Zenovia Jarred, M.D. / EDP   CHIEF COMPLAINT:  Status Epilepticus   Presenting HPI:  76 y.o. female with history of depression, DVT with Short embolism, OSA, and diabetes mellitus type 2 presenting on 11/21 with altered mentation to the emergency department. Initially patient was called as a code stroke. In route to the emergency department she had a seizure and was given 2.5 mg of Versed. In the emergency department patient was noted to have recurrent seizure activity after returning from brain imaging. She received a total of 6 mg of Ativan and subsequently had altered level of consciousness in her postictal and sedated state. Patient was not protecting airway and was subsequently endotracheally intubated. Patient was admitted to the ICU on continuous EEG monitoring. Has no known history of seizures.  Subjective:   C/O headache.  Sitting up in chair. On trach collar.   Temp:  [98.4 F (36.9 C)-99.5 F (37.5 C)] 99.5 F (37.5 C) (11/29 1149) Pulse Rate:  [74-122] 96 (11/29 1000) Resp:  [13-27] 18 (11/29 1000) BP: (116-176)/(56-96) 159/70 (11/29 1000) SpO2:  [82 %-100 %] 98 % (11/29 1000) FiO2 (%):  [28 %-40 %] 35 % (11/29 0821) Weight:  [185 lb 10 oz (84.2 kg)] 185 lb 10 oz (84.2 kg) (11/29 0443)  Vent Mode: PRVC FiO2 (%):  [28 %-40 %] 35 % Set Rate:  [14 bmp] 14 bmp Vt Set:  [380 mL] 380 mL PEEP:  [5 cmH20] 5 cmH20 Plateau Pressure:  [10 cmH20-17 cmH20] 14 cmH20   Intake/Output Summary (Last 24 hours) at 06/14/2017 1154 Last data filed at 06/14/2017 1022 Gross per 24 hour  Intake 942.7 ml  Output 715 ml  Net 227.7 ml   Gen:      No acute distress HEENT:  EOMI, sclera anicteric Neck:     No masses; no thyromegaly, trach site clean, dry Lungs:    Clear to auscultation bilaterally; normal respiratory effort CV:         Regular rate and rhythm; no murmurs Abd:      + bowel sounds; soft,  non-tender; no palpable masses, no distension Ext:    No edema; adequate peripheral perfusion Skin:      Warm and dry; no rash Neuro: alert and oriented x 3 Psych: normal mood and affect   LINES/TUBES: OETT 11/21 >>> 11/27 Foley 11/21 >>> 11/27 OGT 11/21 >>> 11/27 PIV  CBC Recent Labs    06/12/17 0423 06/13/17 0549 06/14/17 0432  WBC 11.5* 7.8 10.8*  HGB 9.1* 7.2* 8.4*  HCT 28.4* 22.8* 26.3*  PLT 194 211 267    Coag's No results for input(s): APTT, INR in the last 72 hours.  BMET Recent Labs    06/12/17 0423 06/14/17 0432  NA 138 139  K 4.0 2.7*  CL 102 101  CO2 29 30  BUN 17 11  CREATININE 0.59 0.63  GLUCOSE 222* 183*    Electrolytes Recent Labs    06/12/17 0423 06/14/17 0432  CALCIUM 8.2* 7.9*    Sepsis Markers Recent Labs    06/13/17 0930 06/14/17 0432  PROCALCITON 0.27 0.20    ABG Recent Labs    06/12/17 1211  PHART 7.376  PCO2ART 55.2*  PO2ART 283.0*    Liver Enzymes Recent Labs    06/14/17 0432  AST 20  ALT 33  ALKPHOS 101  BILITOT 0.5  ALBUMIN 2.1*    Cardiac Enzymes No results for  input(s): TROPONINI, PROBNP in the last 72 hours.  Glucose Recent Labs    06/13/17 1608 06/13/17 2037 06/14/17 0022 06/14/17 0419 06/14/17 0756 06/14/17 1148  GLUCAP 193* 204* 141* 144* 168* 166*    Imaging Dg Chest Port 1 View  Result Date: 06/14/2017 CLINICAL DATA:  Pneumonia EXAM: PORTABLE CHEST 1 VIEW COMPARISON:  06/13/2017 FINDINGS: Tracheostomy remains in good position unchanged. Central venous catheter tip in the SVC is unchanged. No pneumothorax. Feeding tube has been placed in the interval and enters the stomach with the tip not visualized. Bibasilar airspace disease left greater than right is unchanged. Small left effusion. Negative for edema. IMPRESSION: No significant change from yesterday. Support lines remain in good position. Bibasilar airspace disease left greater than right with small left effusion unchanged.  Electronically Signed   By: Franchot Gallo M.D.   On: 06/14/2017 07:34   Dg Chest Port 1 View  Result Date: 06/13/2017 CLINICAL DATA:  Pneumonia EXAM: PORTABLE CHEST 1 VIEW COMPARISON:  06/12/2017 FINDINGS: Tracheostomy and left central line remain in place, unchanged. Cardiomegaly. Mild vascular congestion. Bibasilar opacities have improved since prior study. No visible effusions or acute bony abnormality. IMPRESSION: Improving bibasilar atelectasis or infiltrates. Cardiomegaly, vascular congestion. Electronically Signed   By: Rolm Baptise M.D.   On: 06/13/2017 09:38     IMAGING/STUDIES: CT HEAD W/O 11/21:  No acute cortically based infarct or intracranial hemorrhage identified. ASPECTS is 10.  Increased chronic nonspecific cerebral volume loss since 2014. 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 PORT CXR 11/22:  Previously reviewed by me. Persistent silhouetting left hemidiaphragm suggestive of opacification versus consolidation. Endotracheal tube in good position. Enteric feeding tube coursing below diaphragm. EEG 11/23: This EEG is indicative of a severe diffuse encephalopathy, non-specific as to etiology. Sedating medication effect cannot be excluded. No epileptiform discharges, seizures, focal or lateralizing signs are seen. Compared to previous epoch, there is minimal improvement in the degree of encephalopathy. EEG 11/24:  This EEG is indicative of a variable diffuse encephalopathy, alternating between mild and awake to severe and comatose. Sedating medication effect is strongly suspected as the main driver. No epileptiform discharges, seizures, focal or lateralizing signs are seen.  MRI BRAIN W/O 11/24: IMPRESSION: 1. No acute abnormality. 2. Chronic ischemic microangiopathy. 3. Advanced cerebral volume loss that is asymmetrically worse on the left to a mild degree.  MICROBIOLOGY: MRSA PCR 11/21:  Negative  Sputum 11/28>>> ANTIBIOTICS: Vancomycin 11/27>>> Zosyn  11/27>>> SIGNIFICANT EVENTS: 11/21 - Admit with status epilepticus  11/22 - Taken off Propofol 11/24 - Following commands more consistently. Continuous EEG removed & MRI pending 11/25 - No cuff leak >> decadron & lasix  ASSESSMENT/PLAN:  76 y.o. female admitted with status epilepticus. MRI without obvious focal source. Hemoglobin stable on systemic and coagulation.  Status post emergent tracheostomy.   Progressing well to trach collar.    Status epilepticus: New onset seizure disorder Plan Continue keppra,  Will need neurology follow up on discharge.   Acute hypoxic respiratory failure: Suspect retropharyngeal edema without cuff leak. Vocal cord edema status post emergent tracheostomy 11/27 following failed extubation attempt -She is fully awake and oriented, ventilator mechanics are excellent -Minimal drainage from tracheostomy site Plan Continue trach mask for as long as tolerated Consult speech pathology to initiate passy muir trials.  OOB  Right lower lobe atelectasis versus aspiration pneumonia Plan Follow sputum cx and Pct Day #3 vancomycin and Zosyn  History of depression/anxiety Plan Resume Cymbalta when can take POs PRN Ativan  Protein calorie malnutrition Plan Continue tube feeds  OSA Plan Now has trach  History of DVT/PE: Plan Heparin drip.  Switch to xarelto in a day or 2 when she is reliably off the vent.  Diabetes mellitus type 2:  Plan SSI coverage  hypothyroidism:  Plan Synthyroid  Essential hypertension: Plan Resume Spironolactone and toprol xl Continue telemetry monitoring  Anemia: Possible gastritis/GI bleed. Hemoglobin did drop down to 7.2, she had a fairly traumatic tracheostomy placement with some blood loss as she was still on heparin time given its emergent nature.  Currently no further evidence of bleeding Plan Trend CBC Continue IV heparin  Stable for transfer from ICU. PCCM will follow for trach management.  Marshell Garfinkel  MD [facility] Pager [phone] If no answer or after 3pm call: [phone] 06/14/2017, 12:07 PM

## 2017-06-14 NOTE — Progress Notes (Signed)
Pt's c/o headache and Marni Griffon NP notified. New orders carried out.

## 2017-06-14 NOTE — Progress Notes (Signed)
[facility] Progress Note Patient Name: Amanda Short DOB: 28-Apr-1941 MRN: [id number]   Date of Service  06/14/2017  HPI/Events of Note  hypokalemia  eICU Interventions  Potassium replaced     Intervention Category Intermediate Interventions: Electrolyte abnormality - evaluation and management  [name] 06/14/2017, 5:29 AM

## 2017-06-14 NOTE — Progress Notes (Signed)
Nutrition Follow-up  DOCUMENTATION CODES:   Obesity unspecified  INTERVENTION:    Continue Vital High Protein at 45 ml/h to provide 1080 kcal, 95 gm protein, 903 ml free water daily  NUTRITION DIAGNOSIS:   Inadequate oral intake related to inability to eat as evidenced by NPO status.  Ongoing  GOAL:   Provide needs based on ASPEN/SCCM guidelines  Met with TF  MONITOR:   Vent status, TF tolerance, I & O's  ASSESSMENT:   76 yo female with PMH of breast CA (2016), HLD, HTN, depression, DVT, pulmonary embolism, OSA, DM-2, who was admitted on 11/21 with status epilepticus. Required intubation on admission.  S/P emergent tracheostomy on 11/27.  Cortrak tube was placed on 11/28. Currently on trach collar. Required vent support last night. Labs and medications reviewed. Patient is currently receiving Vital High Protein via Cortrak at 45 ml/h (1080 ml/day) to provide 1080 kcals, 95 gm protein, 903 ml free water daily.  Labs reviewed. Potassium 2.7 (L) CBG's: 144--166 Medications reviewed and include Aldactone and KCl. I/O + 9.7 L since admission Weight trending up  [place]:    Most Recent Value  Orbital Region  No depletion  Upper Arm Region  No depletion  Thoracic and Lumbar Region  No depletion  Buccal Region  No depletion  Temple Region  No depletion  Clavicle Bone Region  No depletion  Clavicle and Acromion Bone Region  No depletion  Scapular Bone Region  Unable to assess  Dorsal Hand  No depletion  Patellar Region  No depletion  Anterior Thigh Region  No depletion  Posterior Calf Region  No depletion  Edema (RD Assessment)  None  Hair  Reviewed  Eyes  Unable to assess  Mouth  Unable to assess  Skin  Reviewed  Nails  Reviewed       Diet Order:  Diet NPO time specified  EDUCATION NEEDS:   No education needs have been identified at this time  Skin:  Skin Assessment: Reviewed RN Assessment  Last BM:  11/24  Height:   Ht  Readings from Last 1 Encounters:  06/09/17 5' 1" (1.549 m)    Weight:   Wt Readings from Last 1 Encounters:  06/14/17 185 lb 10 oz (84.2 kg)   06/08/17 167 lb 15.9 oz (76.2 kg) BMI=31.7    Ideal Body Weight:  47.7 kg  BMI:  Body mass index is 35.07 kg/m.  Estimated Nutritional Needs:   Kcal:  [phone]  Protein:  95 gm  Fluid:  1.5 L   Molli Barrows, RD, LDN, CNSC Pager [phone] After Hours Pager [phone]

## 2017-06-14 NOTE — Progress Notes (Signed)
ANTICOAGULATION CONSULT NOTE - Follow Up Consult  Pharmacy Consult for Heparin Indication: Hx of PE  Allergies  Allergen Reactions  . Allopurinol Nausea And Vomiting    Patient Measurements: Height: _0  (154.9 cm) Weight: 185 lb 10 oz (84.2 kg) IBW/kg (Calculated) : 47.8 Heparin Dosing Weight: 67.7kg  Vital Signs: Temp: 99.5 F (37.5 C) (11/29 1149) Temp Source: Oral (11/29 1149) BP: 144/77 (11/29 1200) Pulse Rate: 91 (11/29 1200)  Labs: Recent Labs    06/12/17 0423 06/12/17 0537 06/13/17 0549 06/13/17 1841 06/14/17 0432  HGB 9.1*  --  7.2*  --  8.4*  HCT 28.4*  --  22.8*  --  26.3*  PLT 194  --  211  --  267  HEPARINUNFRC  --  0.48  --  0.37 0.34  CREATININE 0.59  --   --   --  0.63    Estimated Creatinine Clearance: 58.9 mL/min (by C-G formula based on SCr of 0.63 mg/dL).   Medications:  Scheduled:  . albuterol  2.5 mg Nebulization Q6H  . chlorhexidine gluconate (MEDLINE KIT)  15 mL Mouth Rinse BID  . Chlorhexidine Gluconate Cloth  6 each Topical Q0600  . Chlorhexidine Gluconate Cloth  6 each Topical Daily  . feeding supplement (VITAL HIGH PROTEIN)  1,000 mL Per Tube Q24H  . insulin aspart  0-20 Units Subcutaneous Q4H  . insulin aspart  4 Units Subcutaneous Q4H  . levothyroxine  100 mcg Oral QAC breakfast  . mouth rinse  15 mL Mouth Rinse QID  . [START ON 06/15/2017] metoprolol succinate  100 mg Oral Daily  . pantoprazole sodium  40 mg Per Tube Q1200  . spironolactone  50 mg Oral Daily    Assessment: Amanda Short is a 76 YO F with a PMH of PE and DVT (2014). Last dose of Xarelto on 11/20. Admitted due to altered mental status and seizures en route to the hospital. No signs of current bleeding. Hgb trending up, platelets remain stable. HL remains therapeutic at 0.34 on 1350units/hr, however trending down slightly. Plan to increase heparin by 1unit/kg/hr.  Goal of Therapy:  Heparin level 0.3-0.7 units/ml Monitor platelets by anticoagulation protocol: Yes    Plan:  Increase heparin to 1400units/hr IV continuous Daily CBC and HL Monitor for signs/symptoms of bleeding  Geraldo Pitter  PharmD Candidate 06/14/2017,1:36 PM   I discussed / reviewed the pharmacy note by Ms. Anderson and I agree with the student's findings and plans as documented.  Sloan Leiter, PharmD, BCPS, BCCCP Clinical Pharmacist Clinical phone 06/14/2017 until 3:[phone] After hours, please call [phone] 06/14/2017, 1:46 PM

## 2017-06-14 NOTE — Progress Notes (Signed)
RT transported patient from room 2M09 to room 2C08 on venti mask setup attached to trach collar with no complications. Patient is now in room back on trach collar and vitals are stable. RT transported vent and trach supplies to room for patient. [name]

## 2017-06-15 DIAGNOSIS — E876 Hypokalemia: Secondary | ICD-10-CM

## 2017-06-15 DIAGNOSIS — R0902 Hypoxemia: Secondary | ICD-10-CM

## 2017-06-15 DIAGNOSIS — Z930 Tracheostomy status: Secondary | ICD-10-CM

## 2017-06-15 DIAGNOSIS — D649 Anemia, unspecified: Secondary | ICD-10-CM

## 2017-06-15 LAB — CULTURE, RESPIRATORY W GRAM STAIN

## 2017-06-15 LAB — GLUCOSE, CAPILLARY
GLUCOSE-CAPILLARY: 151 mg/dL — AB (ref 65–99)
GLUCOSE-CAPILLARY: 109 mg/dL — AB (ref 65–99)
Glucose-Capillary: 166 mg/dL — ABNORMAL HIGH (ref 65–99)
Glucose-Capillary: 158 mg/dL — ABNORMAL HIGH (ref 65–99)
Glucose-Capillary: 201 mg/dL — ABNORMAL HIGH (ref 65–99)

## 2017-06-15 LAB — BASIC METABOLIC PANEL
Anion gap: 7 (ref 5–15)
BUN: 8 mg/dL (ref 6–20)
CO2: 31 mmol/L (ref 22–32)
CREATININE: 0.65 mg/dL (ref 0.44–1.00)
Calcium: 8.5 mg/dL — ABNORMAL LOW (ref 8.9–10.3)
Chloride: 103 mmol/L (ref 101–111)
GFR calc Af Amer: >60 mL/min (ref >60)
Glucose, Bld: 189 mg/dL — ABNORMAL HIGH (ref 65–99)
Potassium: 4 mmol/L (ref 3.5–5.1)
SODIUM: 141 mmol/L (ref 135–145)

## 2017-06-15 LAB — CBC
HCT: 29.3 % — ABNORMAL LOW (ref 36.0–46.0)
Hemoglobin: 9 g/dL — ABNORMAL LOW (ref 12.0–15.0)
MCH: 30.7 pg (ref 26.0–34.0)
MCHC: 30.7 g/dL (ref 30.0–36.0)
MCV: 100 fL (ref 78.0–100.0)
PLATELETS: 344 10*3/uL (ref 150–400)
RBC: 2.93 MIL/uL — ABNORMAL LOW (ref 3.87–5.11)
RDW: 14.2 % (ref 11.5–15.5)
WBC: 9.8 10*3/uL (ref 4.0–10.5)

## 2017-06-15 LAB — CULTURE, RESPIRATORY

## 2017-06-15 LAB — HEPARIN LEVEL (UNFRACTIONATED): Heparin Unfractionated: 0.32 IU/mL (ref 0.30–0.70)

## 2017-06-15 LAB — PROCALCITONIN: PROCALCITONIN: 0.15 ng/mL

## 2017-06-15 NOTE — Care Management Note (Signed)
Case Management Note  Patient Details  Name: Amanda Short MRN: [id number] Date of Birth: May 10, 1941  Subjective/Objective:     Pt admitted with seizures               Action/Plan:  PTA independent from home with husband.  Pt had to be emergently trached 11/27 post failing extubation.  Pt is already on TC 11/29 and per PCCM will likely wean very quickly.  Tentative discharge plan will be SNF - CM will continue to follow for discharge needs   Expected Discharge Date:                  Expected Discharge Plan:     In-House Referral:     Discharge planning Services     Post Acute Care Choice:    Choice offered to:     DME Arranged:    DME Agency:     HH Arranged:    HH Agency:     Status of Service:     If discussed at [employer] of Stay Meetings, dates discussed:    Additional Comments: 06/15/2017 Pt moved to [place].  Pt is on TC 40%, PMV not yet utilized as of yet.  Pt remains on IV hepairn.  Per bedside nurse pt is 2+ assist.  PT/OT eval ordered Maryclare Labrador, RN 06/15/2017, 10:39 AM

## 2017-06-15 NOTE — Plan of Care (Signed)
Continue current care plan

## 2017-06-15 NOTE — Progress Notes (Signed)
PROGRESS NOTE    Amanda Short  OQH:[id number] DOB: 27-Feb-1941 DOA: 06/06/2017 PCP: System, Pcp Not In   Chief Complaint  Patient presents with  . Altered Mental Status    Brief Narrative:  HPI on 06/06/2017 by Dr. Jennet Maduro (PCCM) Pt is encephelopathic; therefore, this HPI is obtained from chart review. Amanda Short is a 76 y.o. female with PMH as outlined below.  She was brought to [facility] ED 11/21 with AMS.  She was last seen normal 4 - 5 hours prior.  Initially called as code stroke. En route to ED, had seizure for which she was given 2.20m of versed.  In ED, had recurrent CT after returning from CT scan.  Received 622mativan and later had decrease in mental status / post ictal state where she was not protecting airway. She was subsequently intubated and PCCM was asked to admit to the ICU.  Neurology consulted by EDP. No known hx of seizures.  Interim history Patient received trach and transferred to stepdown. TRH assumed care on 06/15/2017.  Assessment & Plan   Status epilepticus, new seizure disorder -patient presented with seizure and was intubated for airway protection -Neurology consulted and appreciated, suspected low magnesium at to be a possible precipitant  -She was placed on Keppra [id number] -EEG as listed below -Has not had further episodes since admission -will likely need neurology outpatient follow up  Acute on chronic hypoxic respiratory fialure -suspected retropharyngeal edema/vocal cord edema s/p emergent trach on 06/12/17 after failing extubation attempt -Currently not on vent -PCCM continues to follow, plan for change to a cuffless 4 next week -Speech therapy consulted to initiate passy muir trials -Per husband, patient uses 2.5 L of oxygen at home  Chronic Anemia -Possibly gastritis versus GI bleed -Hemoglobin dropped to 7.2, received no blood transfusions -Hemoglobin currently 9 (baseline 10-11) -Patient had a fairly traumatic tracheostomy  placement with some blood loss and has been on heparin -Currently no evidence of bleeding -Continue to monitor CBC  Right lower lobe atelecatsis vs aspiration pneumonia -was placed on vancomycin and Zosyn  Hypomagnesemia -last magnesium level 1.5 on 06/10/2017 -will obtain repeat level and replace as needed  Hypocalcemia -Calcium 8.5 -continue to monitor   Hypokalemia -resolved, continue to monitor   Acute encephalopathy -Resolved  Depression/anxiety -Cymbalta held-will restart once patient can tolerate orals, continue Ativan as needed  OSA -Continue trach  History of DVT/PE -Currently on heparin -Drill to held, will restart once patient is able to tolerate orals  Protein calorie malnutrition -Continue tube feeds -Speech therapy consulted  Diabetes mellitus, type II -Continue insulin sliding scale with CBG monitoring  Essential hypertension -Continue metoprolol, spironolactone  Hypothyroidism -Continue Synthroid  DVT Prophylaxis  heparin  Code Status: Full  Family Communication: Husband at bedside  Disposition Plan: Admitted. dispo pending   Consultants PCCM Neurology  Procedures  CT HEAD W/O 11/21:  No acute cortically based infarct or intracranial hemorrhage identified. ASPECTS is 10.  Increased chronic nonspecific cerebral volume loss since 2014. 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 PORT CXR 11/22:  Previously reviewed by me. Persistent silhouetting left hemidiaphragm suggestive of opacification versus consolidation. Endotracheal tube in good position. Enteric feeding tube coursing below diaphragm. EEG 11/23: This EEG is indicative of a severe diffuse encephalopathy, non-specific as to etiology. Sedating medication effect cannot be excluded. No epileptiform discharges, seizures, focal or lateralizing signs are seen.Compared to previous epoch, there is minimal improvement in the degree of encephalopathy. EEG 11/24:  This EEG is  indicative of avariablediffuse encephalopathy,alternating between mild and awake to severe and comatose. Sedating medication effectis strongly suspected as the main driver. No epileptiform discharges, seizures, focal or lateralizing signs are seen. MRI BRAIN W/O 11/24: IMPRESSION: 1. No acute abnormality. 2. Chronic ischemic microangiopathy. 3. Advanced cerebral volume loss that is asymmetrically worse on the left to a mild degree.  Antibiotics   Anti-infectives (From admission, onward)   Start     Dose/Rate Route Frequency Ordered Stop   06/13/17 0100  vancomycin (VANCOCIN) IVPB 750 mg/150 ml premix  Status:  Discontinued     750 mg 150 mL/hr over 60 Minutes Intravenous Every 12 hours 06/12/17 1335 06/15/17 1022   06/12/17 1200  piperacillin-tazobactam (ZOSYN) IVPB 3.375 g     3.375 g 12.5 mL/hr over 240 Minutes Intravenous Every 8 hours 06/12/17 1118     06/12/17 1130  vancomycin (VANCOCIN) 1,500 mg in sodium chloride 0.9 % 500 mL IVPB     1,500 mg 250 mL/hr over 120 Minutes Intravenous  Once 06/12/17 1118 06/12/17 1351      Subjective:   Amanda Short seen and examined today.  Feeling better. Would like to eat and feels hunry. Denies chest pain, shortness of breath, abdominal pain, N/V, dizziness, or headache.     Objective:   Vitals:   06/15/17 0805 06/15/17 1124 06/15/17 1245 06/15/17 1414  BP: (!) 173/91  (!) 148/91   Pulse: 82 78 84   Resp: 18 (!) 23 19   Temp: (!) 97.5 F (36.4 C)     TempSrc: Oral     SpO2: 100% 99% 99% 94%  Weight:      Height:        Intake/Output Summary (Last 24 hours) at 06/15/2017 1442 Last data filed at 06/15/2017 0900 Gross per 24 hour  Intake 1586 ml  Output 1550 ml  Net 36 ml   Filed Weights   06/13/17 0500 06/14/17 0443 06/15/17 0325  Weight: 85.2 kg (187 lb 13.3 oz) 84.2 kg (185 lb 10 oz) 100.4 kg (221 lb 5.5 oz)    Exam  General: Well developed, well nourished, NAD, appears stated age  HEENT: NCAT, mucous  membranes moist. NG tube in place  Neck: Trach  Cardiovascular: S1 S2 auscultated, no rubs, murmurs or gallops. Regular rate and rhythm.  Respiratory: Clear to auscultation bilaterally with equal chest rise  Abdomen: Soft, nontender, nondistended, + bowel sounds  Extremities: warm dry without cyanosis clubbing or edema  Neuro: AAOx3, nonfocal  Skin: Without rashes exudates or nodules  Psych: Normal affect and demeanor with intact judgement and insight   Data Reviewed: I have personally reviewed following labs and imaging studies  CBC: Recent Labs  Lab 06/09/17 0628 06/10/17 0416 06/11/17 0249 06/12/17 0423 06/13/17 0549 06/14/17 0432 06/15/17 0534  WBC 11.6* 11.9* 12.6* 11.5* 7.8 10.8* 9.8  NEUTROABS 9.4* 9.2* 10.7* 9.2* 5.4  --   --   HGB 9.7* 9.6* 9.5* 9.1* 7.2* 8.4* 9.0*  HCT 30.3* 30.4* 29.6* 28.4* 22.8* 26.3* 29.3*  MCV 97.1 100.0 97.0 98.6 100.4* 98.5 100.0  PLT 187 186 204 194 211 267 956   Basic Metabolic Panel: Recent Labs  Lab 06/08/17 1837  06/09/17 0628 06/10/17 0416  06/11/17 0249 06/12/17 0423 06/14/17 0432 06/14/17 1959 06/15/17 0534  NA  --    < > 137 137   < > 138 138 139 140 141  K  --    < > 3.9 3.6   < > 3.9 4.0 2.7*  3.8 4.0  CL  --    < > 104 106   < > 101 102 101 103 103  CO2  --    < > 26 23   < > _0 GLUCOSE  --    < > 143* 173*   < > 188* 222* 183* 163* 189*  BUN  --    < > 8 10   < > _1 CREATININE  --    < > 0.67 0.63   < > 0.60 0.59 0.63 0.58 0.65  CALCIUM  --    < > 7.6* 7.7*   < > 7.9* 8.2* 7.9* 8.2* 8.5*  MG 2.0  --  1.8 1.5*  --   --   --   --   --   --   PHOS 3.3  --  2.9  2.9 2.8  --  2.9  --   --   --   --    < > = values in this interval not displayed.   GFR: Estimated Creatinine Clearance: 65 mL/min (by C-G formula based on SCr of 0.65 mg/dL). Liver Function Tests: Recent Labs  Lab 06/09/17 0628 06/10/17 0416 06/11/17 0249 06/14/17 0432  AST  --   --   --  20  ALT  --   --   --  33    ALKPHOS  --   --   --  101  BILITOT  --   --   --  0.5  PROT  --   --   --  4.6*  ALBUMIN 2.5* 2.5* 2.5* 2.1*   No results for input(s): LIPASE, AMYLASE in the last 168 hours. No results for input(s): AMMONIA in the last 168 hours. Coagulation Profile: No results for input(s): INR, PROTIME in the last 168 hours. Cardiac Enzymes: No results for input(s): CKTOTAL, CKMB, CKMBINDEX, TROPONINI in the last 168 hours. BNP (last 3 results) No results for input(s): PROBNP in the last 8760 hours. HbA1C: No results for input(s): HGBA1C in the last 72 hours. CBG: Recent Labs  Lab 06/14/17 1956 06/14/17 2313 06/15/17 0324 06/15/17 0810 06/15/17 1238  GLUCAP 131* 163* 151* 166* 109*   Lipid Profile: Recent Labs    06/12/17 1745  TRIG 112   Thyroid Function Tests: No results for input(s): TSH, T4TOTAL, FREET4, T3FREE, THYROIDAB in the last 72 hours. Anemia Panel: No results for input(s): VITAMINB12, FOLATE, FERRITIN, TIBC, IRON, RETICCTPCT in the last 72 hours. Urine analysis:    Component Value Date/Time   COLORURINE STRAW (A) 06/06/2017 1547   APPEARANCEUR CLEAR 06/06/2017 1547   LABSPEC 1.006 06/06/2017 1547   PHURINE 6.0 06/06/2017 1547   GLUCOSEU NEGATIVE 06/06/2017 1547   HGBUR NEGATIVE 06/06/2017 1547   BILIRUBINUR NEGATIVE 06/06/2017 1547   KETONESUR NEGATIVE 06/06/2017 1547   PROTEINUR 30 (A) 06/06/2017 1547   UROBILINOGEN 0.2 03/31/2013 2006   NITRITE NEGATIVE 06/06/2017 1547   LEUKOCYTESUR NEGATIVE 06/06/2017 1547   Sepsis Labs: _2 (procalcitonin:4,lacticidven:4)  ) Recent Results (from the past 240 hour(s))  MRSA PCR Screening     Status: None   Collection Time: 06/06/17  6:00 PM  Result Value Ref Range Status   MRSA by PCR NEGATIVE NEGATIVE Final    Comment:        The GeneXpert MRSA Assay (FDA approved for NASAL specimens only), is one component of a comprehensive MRSA colonization surveillance program. It is not intended to diagnose  MRSA  infection nor to guide or monitor treatment for MRSA infections.   Culture, respiratory (NON-Expectorated)     Status: None   Collection Time: 06/13/17 12:12 PM  Result Value Ref Range Status   Specimen Description TRACHEAL ASPIRATE  Final   Special Requests NONE  Final   Gram Stain   Final    RARE WBC PRESENT, PREDOMINANTLY PMN RARE GRAM POSITIVE COCCI IN PAIRS    Culture RARE STREPTOCOCCUS GROUP F  Final   Report Status 06/15/2017 FINAL  Final      Radiology Studies: Dg Chest Port 1 View  Result Date: 06/14/2017 CLINICAL DATA:  Pneumonia EXAM: PORTABLE CHEST 1 VIEW COMPARISON:  06/13/2017 FINDINGS: Tracheostomy remains in good position unchanged. Central venous catheter tip in the SVC is unchanged. No pneumothorax. Feeding tube has been placed in the interval and enters the stomach with the tip not visualized. Bibasilar airspace disease left greater than right is unchanged. Small left effusion. Negative for edema. IMPRESSION: No significant change from yesterday. Support lines remain in good position. Bibasilar airspace disease left greater than right with small left effusion unchanged. Electronically Signed   By: Franchot Gallo M.D.   On: 06/14/2017 07:34     Scheduled Meds: . albuterol  2.5 mg Nebulization Q6H  . chlorhexidine  15 mL Mouth Rinse BID  . feeding supplement (VITAL HIGH PROTEIN)  1,000 mL Per Tube Q24H  . insulin aspart  0-20 Units Subcutaneous Q4H  . insulin aspart  4 Units Subcutaneous Q4H  . levothyroxine  100 mcg Oral QAC breakfast  . mouth rinse  15 mL Mouth Rinse q12n4p  . metoprolol succinate  100 mg Oral Daily  . pantoprazole sodium  40 mg Per Tube Q1200  . spironolactone  50 mg Oral Daily   Continuous Infusions: . heparin 1,400 Units/hr (06/15/17 0400)  . levETIRAcetam Stopped (06/15/17 0357)  . piperacillin-tazobactam (ZOSYN)  IV 3.375 g (06/15/17 1342)     LOS: 9 days   Time Spent in minutes   30 minutes  Jayant Kriz D.O. on  06/15/2017 at 2:42 PM  Between 7am to 7pm - Pager - [phone]  After 7pm go to [email or web address] - password [email or web address]  And look for the night coverage person covering for me after hours  [facility] Office  [phone]

## 2017-06-15 NOTE — Progress Notes (Signed)
Provider notified that the patient has removed the Cortrak feeding tube. No signs of respiratory distress. Will continue to closely monitor.

## 2017-06-15 NOTE — Progress Notes (Signed)
Pt seen by trach team for consult. Spoke with pt and husband about trach education.  All necessary equipment available at bedside. Will continue to monitor for progress and continue trach education throughout current admission.

## 2017-06-15 NOTE — Evaluation (Signed)
Passy-Muir Speaking Valve - Evaluation Patient Details  Name: Amanda Short MRN: [id number] Date of Birth: 03-05-1941  Today's Date: 06/15/2017 Time: 1645-1700 SLP Time Calculation (min) (ACUTE ONLY): 15 min  Past Medical History:  Past Medical History:  Diagnosis Date  . Arthritis    "back, hands" (07/22/2015)  . Cancer of left breast ([place]) 2006   S/P lumpectomy  . Complication of anesthesia    "brief breathing problem at surgery center in ~ 2005 when I had gallbladder OR"  . Depression   . DVT (deep venous thrombosis) ([place]) 03/2013   LLE  . GERD (gastroesophageal reflux disease)   . Hyperlipidemia   . Hypertension   . Hypothyroidism   . OSA treated with BiPAP   . Pulmonary embolism ([place]) 03/2013  . Thyroid disease    Hypothyroid  . Type II diabetes mellitus ([place])    Past Surgical History:  Past Surgical History:  Procedure Laterality Date  . ABDOMINAL HYSTERECTOMY  1970s  . BREAST BIOPSY Left 2006  . BREAST LUMPECTOMY Left 2006  . BUNIONECTOMY WITH HAMMERTOE RECONSTRUCTION Left   . ESOPHAGOGASTRODUODENOSCOPY N/A 05/29/2017   Procedure: ESOPHAGOGASTRODUODENOSCOPY (EGD);  Surgeon: Irene Shipper, MD;  Location: [facility];  Service: Endoscopy;  Laterality: N/A;  . JOINT REPLACEMENT    . LAPAROSCOPIC CHOLECYSTECTOMY  ~ 2005  . TOTAL HIP ARTHROPLASTY Left 2010  . TOTAL KNEE ARTHROPLASTY Bilateral 2005-2006   HPI:  76 y.o.female with history of depression, DVT with pulmonary embolism, OSA, and diabetes mellitus type 2 presented on 11/21 with altered mentation, status elipticus.  MRI without obvious focal source. ETT 11/21; extubated 11/27, but with immediate stridor/vocal fold edema; unable to reintubate and required emergent trach.    Assessment / Plan / Recommendation Clinical Impression  Pt with excellent toleration of PMV during initial assessment.  Cuff was partially deflated at baseline; removed all air from balloon.  Sp02, RR, HR remained stable.  Occlusion of  trach revealed adequate airway patency.  Valve placed; patient achieved adequate phonation, hoarse vocal quality; no evidence of air trapping.  VS remained stable throughout use.  Recommend pt use PMV all waking hours with frequent supervision by staff.  Remove when resting or when left alone for long periods.  SLP will follow for toleration/communication.  Recommend swallowing evaluation.  SLP Visit Diagnosis: Aphonia (R49.1)    SLP Assessment  Patient needs continued Speech Lanaguage Pathology Services    Follow Up Recommendations  Inpatient Rehab    Frequency and Duration min 3x week  2 weeks    PMSV Trial PMSV was placed for: 15 Able to redirect subglottic air through upper airway: Yes Able to Attain Phonation: Yes Voice Quality: Hoarse Able to Expectorate Secretions: Yes Level of Secretion Expectoration with PMSV: Not observed Breath Support for Phonation: Adequate Intelligibility: Intelligible Respirations During Trial: 14 SpO2 During Trial: 94 % Behavior: Alert;Cooperative   Tracheostomy Tube  Additional Tracheostomy Tube Assessment Fenestrated: No    Vent Dependency  FiO2 (%): 40 %    Cuff Deflation Trial  GO Tolerated Cuff Deflation: Yes Cuff Deflation Trial - Comments: (partially deflated upon entering room)        Amanda Short 06/15/2017, 5:25 PM

## 2017-06-15 NOTE — Progress Notes (Signed)
ANTICOAGULATION CONSULT NOTE - Follow Up Consult  Pharmacy Consult for Heparin Indication: Hx of PE  Allergies  Allergen Reactions  . Allopurinol Nausea And Vomiting   Patient Measurements: Height: _0  (154.9 cm) Weight: 221 lb 5.5 oz (100.4 kg)(bed scale may have not been 0'd upon patient's arrival) IBW/kg (Calculated) : 47.8 Heparin Dosing Weight: 67.7kg  Vital Signs: Temp: 97.5 F (36.4 C) (11/30 0805) Temp Source: Oral (11/30 0805) BP: 173/91 (11/30 0805) Pulse Rate: 82 (11/30 0805)  Labs: Recent Labs    06/13/17 0549 06/13/17 1841 06/14/17 0432 06/14/17 1959 06/15/17 0534  HGB 7.2*  --  8.4*  --  9.0*  HCT 22.8*  --  26.3*  --  29.3*  PLT 211  --  267  --  344  HEPARINUNFRC  --  0.37 0.34  --  0.32  CREATININE  --   --  0.63 0.58 0.65    Estimated Creatinine Clearance: 65 mL/min (by C-G formula based on SCr of 0.65 mg/dL).   Medications:  Scheduled:  . albuterol  2.5 mg Nebulization Q6H  . chlorhexidine  15 mL Mouth Rinse BID  . feeding supplement (VITAL HIGH PROTEIN)  1,000 mL Per Tube Q24H  . insulin aspart  0-20 Units Subcutaneous Q4H  . insulin aspart  4 Units Subcutaneous Q4H  . levothyroxine  100 mcg Oral QAC breakfast  . mouth rinse  15 mL Mouth Rinse q12n4p  . metoprolol succinate  100 mg Oral Daily  . pantoprazole sodium  40 mg Per Tube Q1200  . spironolactone  50 mg Oral Daily   Assessment: Amanda Short is a 76 YO F with a PMH of PE and DVT (2014). Last dose of Xarelto on 11/20. Admitted due to altered mental status and seizures en route to the hospital. No signs of current bleeding. Hgb trending up, platelets remain stable.   Heparin level remains therapeutic at low end of goal: 0.32; NO bleeding or infusion issues reported by RN, Will emperically increase heparin rate to prevent subtherapeutic level  Goal of Therapy:  Heparin level 0.3-0.7 units/ml Monitor platelets by anticoagulation protocol: Yes   Plan:  Increase heparin gtt to 1450 units/hr   Daily CBC and heparin level Monitor for signs/symptoms of bleeding  Georga Bora, PharmD Clinical Pharmacist 06/15/2017 10:28 AM

## 2017-06-15 NOTE — Progress Notes (Signed)
Pharmacy Antibiotic Note  Amanda Short is a 76 y.o. female with a PMH of breast cancer, depression, GERD, HLD, HTN, hypothyroidism, PE (2014), T2DM. Patient was admitted on 06/06/2017 with altered mental status and seizure activity. Patient is now in progressive care unit s/p emergent tracheostomy. Patient's lungs are clearing and primary teams has asked to stop vancomycin and continue Zosyn. ProCal trending down to 0.15, WBC 9.8, afebrile  Plan: Continue Zosyn 3.375g IV Q8H, 4 hr infusion Monitor WBC, Temp, CXR, SCr, UOP, S/sx of toxicity  Height: _0  (154.9 cm) Weight: 221 lb 5.5 oz (100.4 kg)(bed scale may have not been 0'd upon patient's arrival) IBW/kg (Calculated) : 47.8  Temp (24hrs), Avg:98.4 F (36.9 C), Min:97.4 F (36.3 C), Max:99.5 F (37.5 C)  Recent Labs  Lab 06/11/17 0249 06/12/17 0423 06/13/17 0549 06/14/17 0432 06/14/17 1959 06/15/17 0534  WBC 12.6* 11.5* 7.8 10.8*  --  9.8  CREATININE 0.60 0.59  --  0.63 0.58 0.65    Estimated Creatinine Clearance: 65 mL/min (by C-G formula based on SCr of 0.65 mg/dL).    Allergies  Allergen Reactions  . Allopurinol Nausea And Vomiting    Antimicrobials this admission: 11/27 Zosyn >>  11/27 Vanc >> 11/30  Dose adjustments this admission: N/A  Microbiology results: 11/21 MRSA PCR: Negative  Thank you for allowing pharmacy to be a part of this patient's care.  Georga Bora, PharmD Clinical Pharmacist 06/15/2017 10:27 AM

## 2017-06-15 NOTE — Progress Notes (Signed)
[facility]  ADMISSION DATE:  06/06/2017  REFERRING MD:  Zenovia Jarred, M.D. / EDP   CHIEF COMPLAINT:  Status Epilepticus   Presenting HPI:  76 y.o. female with history of depression, DVT with pulmonary embolism, OSA, and diabetes mellitus type 2 presenting on 11/21 with altered mentation to the emergency department. Initially patient was called as a code stroke. In route to the emergency department she had a seizure and was given 2.5 mg of Versed. In the emergency department patient was noted to have recurrent seizure activity after returning from brain imaging. She received a total of 6 mg of Ativan and subsequently had altered level of consciousness in her postictal and sedated state. Patient was not protecting airway and was subsequently endotracheally intubated. Patient was admitted to the ICU on continuous EEG monitoring. Has no known history of seizures.  Subjective:   No events overnight, tolerating TC  Temp:  [97.4 F (36.3 C)-99.5 F (37.5 C)] 97.5 F (36.4 C) (11/30 0805) Pulse Rate:  [77-95] 82 (11/30 0805) Resp:  [18-28] 18 (11/30 0805) BP: (128-173)/(69-133) 173/91 (11/30 0805) SpO2:  [92 %-100 %] 100 % (11/30 0805) FiO2 (%):  [28 %-40 %] 40 % (11/30 0805) Weight:  [100.4 kg (221 lb 5.5 oz)] 100.4 kg (221 lb 5.5 oz) (11/30 0325)  FiO2 (%):  [28 %-40 %] 40 %   Intake/Output Summary (Last 24 hours) at 06/15/2017 1122 Last data filed at 06/15/2017 0900 Gross per 24 hour  Intake 1949 ml  Output 1725 ml  Net 224 ml   Gen:      Well appearing, NAD HEENT:  [place]/AT, PERRL, EOM-I and MMM Neck:      Supple, no masses Lungs:    CTA bilaterally CV:         RRR, Nl S1/S2 and -M/R/G Abd:      + bowel sounds; soft, non-tender; no palpable masses, no distension Ext:    No edema; adequate peripheral perfusion Skin:      Warm and dry; no rash Neuro: alert and oriented x 3 Psych: normal mood and affect   LINES/TUBES: OETT 11/21 >>> 11/27 Foley 11/21  >>> 11/27 OGT 11/21 >>> 11/27 PIV  CBC Recent Labs    06/13/17 0549 06/14/17 0432 06/15/17 0534  WBC 7.8 10.8* 9.8  HGB 7.2* 8.4* 9.0*  HCT 22.8* 26.3* 29.3*  PLT 211 267 344    Coag's No results for input(s): APTT, INR in the last 72 hours.  BMET Recent Labs    06/14/17 0432 06/14/17 1959 06/15/17 0534  NA 139 140 141  K 2.7* 3.8 4.0  CL 101 103 103  CO2 _0 BUN _1 CREATININE 0.63 0.58 0.65  GLUCOSE 183* 163* 189*    Electrolytes Recent Labs    06/14/17 0432 06/14/17 1959 06/15/17 0534  CALCIUM 7.9* 8.2* 8.5*    Sepsis Markers Recent Labs    06/13/17 0930 06/14/17 0432 06/15/17 0534  PROCALCITON 0.27 0.20 0.15    ABG Recent Labs    06/12/17 1211  PHART 7.376  PCO2ART 55.2*  PO2ART 283.0*    Liver Enzymes Recent Labs    06/14/17 0432  AST 20  ALT 33  ALKPHOS 101  BILITOT 0.5  ALBUMIN 2.1*    Cardiac Enzymes No results for input(s): TROPONINI, PROBNP in the last 72 hours.  Glucose Recent Labs    06/14/17 1148 06/14/17 1600 06/14/17 1956 06/14/17 2313 06/15/17 0324 06/15/17 0810  GLUCAP 166*  147* 131* 163* 151* 166*    Imaging Dg Chest Port 1 View  Result Date: 06/14/2017 CLINICAL DATA:  Pneumonia EXAM: PORTABLE CHEST 1 VIEW COMPARISON:  06/13/2017 FINDINGS: Tracheostomy remains in good position unchanged. Central venous catheter tip in the SVC is unchanged. No pneumothorax. Feeding tube has been placed in the interval and enters the stomach with the tip not visualized. Bibasilar airspace disease left greater than right is unchanged. Small left effusion. Negative for edema. IMPRESSION: No significant change from yesterday. Support lines remain in good position. Bibasilar airspace disease left greater than right with small left effusion unchanged. Electronically Signed   By: Franchot Gallo M.D.   On: 06/14/2017 07:34     IMAGING/STUDIES: CT HEAD W/O 11/21:  No acute cortically based infarct or intracranial  hemorrhage identified. ASPECTS is 10.  Increased chronic nonspecific cerebral volume loss since 2014. 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 PORT CXR 11/22:  Previously reviewed by me. Persistent silhouetting left hemidiaphragm suggestive of opacification versus consolidation. Endotracheal tube in good position. Enteric feeding tube coursing below diaphragm. EEG 11/23: This EEG is indicative of a severe diffuse encephalopathy, non-specific as to etiology. Sedating medication effect cannot be excluded. No epileptiform discharges, seizures, focal or lateralizing signs are seen. Compared to previous epoch, there is minimal improvement in the degree of encephalopathy. EEG 11/24:  This EEG is indicative of a variable diffuse encephalopathy, alternating between mild and awake to severe and comatose. Sedating medication effect is strongly suspected as the main driver. No epileptiform discharges, seizures, focal or lateralizing signs are seen.  MRI BRAIN W/O 11/24: IMPRESSION: 1. No acute abnormality. 2. Chronic ischemic microangiopathy. 3. Advanced cerebral volume loss that is asymmetrically worse on the left to a mild degree.  MICROBIOLOGY: MRSA PCR 11/21:  Negative  Sputum 11/28>>> ANTIBIOTICS: Vancomycin 11/27>>> Zosyn 11/27>>> SIGNIFICANT EVENTS: 11/21 - Admit with status epilepticus  11/22 - Taken off Propofol 11/24 - Following commands more consistently. Continuous EEG removed & MRI pending 11/25 - No cuff leak >> decadron & lasix  I reviewed CXR myself, trach is in good position  ASSESSMENT/PLAN:  76 y.o. female admitted with status epilepticus. MRI without obvious focal source. Hemoglobin stable on systemic and coagulation.  Status post emergent tracheostomy.  Progressing well to trach collar. Discussed with PCCM-NP.  Acute respiratory failure due to post ictal state and upper airway obstruction  - Trach in place  - Monitor for airway protection  - If no improvement  over the weekend may need to consider ENT evaluation  Hypoxemia:  - Titrate O2 for sat of 88-92%  Trach status:  - Maintain current trach size and type, can change to a cuffless 4 next week (day 7 of trach use)  - Anticipate if vocal cords improve to decannulate quickly  Seizure:  - Per neuro  PCCM will see again on Monday   Rush Farmer, M.D. [facility]. Pager: [phone]. After hours pager: [phone].  06/15/2017, 11:22 AM

## 2017-06-16 LAB — COMPREHENSIVE METABOLIC PANEL
ALBUMIN: 2.1 g/dL — AB (ref 3.5–5.0)
ALT: 22 U/L (ref 14–54)
AST: 12 U/L — AB (ref 15–41)
Alkaline Phosphatase: 79 U/L (ref 38–126)
Anion gap: 9 (ref 5–15)
BILIRUBIN TOTAL: 0.8 mg/dL (ref 0.3–1.2)
BUN: 6 mg/dL (ref 6–20)
CO2: 32 mmol/L (ref 22–32)
Calcium: 8.6 mg/dL — ABNORMAL LOW (ref 8.9–10.3)
Chloride: 101 mmol/L (ref 101–111)
Creatinine, Ser: 0.69 mg/dL (ref 0.44–1.00)
GFR calc Af Amer: >60 mL/min (ref >60)
GFR calc non Af Amer: >60 mL/min (ref >60)
GLUCOSE: 138 mg/dL — AB (ref 65–99)
POTASSIUM: 3.7 mmol/L (ref 3.5–5.1)
Sodium: 142 mmol/L (ref 135–145)
TOTAL PROTEIN: 4.9 g/dL — AB (ref 6.5–8.1)

## 2017-06-16 LAB — CBC
HEMATOCRIT: 29.5 % — AB (ref 36.0–46.0)
HEMOGLOBIN: 9 g/dL — AB (ref 12.0–15.0)
MCH: 30.8 pg (ref 26.0–34.0)
MCHC: 30.5 g/dL (ref 30.0–36.0)
MCV: 101 fL — ABNORMAL HIGH (ref 78.0–100.0)
Platelets: 367 10*3/uL (ref 150–400)
RBC: 2.92 MIL/uL — AB (ref 3.87–5.11)
RDW: 14 % (ref 11.5–15.5)
WBC: 9 10*3/uL (ref 4.0–10.5)

## 2017-06-16 LAB — HEPARIN LEVEL (UNFRACTIONATED): HEPARIN UNFRACTIONATED: 0.55 [IU]/mL (ref 0.30–0.70)

## 2017-06-16 LAB — MAGNESIUM: Magnesium: 1.2 mg/dL — ABNORMAL LOW (ref 1.7–2.4)

## 2017-06-16 LAB — GLUCOSE, CAPILLARY
GLUCOSE-CAPILLARY: 121 mg/dL — AB (ref 65–99)
GLUCOSE-CAPILLARY: 119 mg/dL — AB (ref 65–99)
GLUCOSE-CAPILLARY: 132 mg/dL — AB (ref 65–99)
Glucose-Capillary: 119 mg/dL — ABNORMAL HIGH (ref 65–99)
Glucose-Capillary: 120 mg/dL — ABNORMAL HIGH (ref 65–99)
Glucose-Capillary: 136 mg/dL — ABNORMAL HIGH (ref 65–99)

## 2017-06-16 MED ORDER — ACETAMINOPHEN 650 MG RE SUPP
650.0000 mg | RECTAL | Status: DC | PRN
  Administered 2017-06-16 (×2): 650 mg via RECTAL
  Filled 2017-06-16 (×2): qty 1

## 2017-06-16 MED ORDER — MAGNESIUM SULFATE 4 GM/100ML IV SOLN
4.0000 g | Freq: Once | INTRAVENOUS | Status: AC
  Administered 2017-06-16: 4 g via INTRAVENOUS
  Filled 2017-06-16: qty 100

## 2017-06-16 MED ORDER — IPRATROPIUM-ALBUTEROL 0.5-2.5 (3) MG/3ML IN SOLN: 3.0000 mL | RESPIRATORY_TRACT | Status: DC | PRN

## 2017-06-16 MED ORDER — ALBUTEROL SULFATE (2.5 MG/3ML) 0.083% IN NEBU
2.5000 mg | INHALATION_SOLUTION | RESPIRATORY_TRACT | Status: DC | PRN
  Administered 2017-06-16 – 2017-06-20 (×3): 2.5 mg via RESPIRATORY_TRACT
  Filled 2017-06-16 (×2): qty 3

## 2017-06-16 NOTE — Progress Notes (Signed)
Rehab Admissions Coordinator Note:  Patient was screened by Cleatrice Burke for appropriateness for an Inpatient Acute Rehab Consult.  At this time, we are recommending Inpatient Rehab consult. Please place an order for an inpt rehab consult.  Cleatrice Burke 06/16/2017, 6:09 PM  I can be reached at [phone].

## 2017-06-16 NOTE — Progress Notes (Signed)
ANTICOAGULATION CONSULT NOTE - Follow Up Consult  Pharmacy Consult for Heparin while rivaroxaban on hold Indication: Hx of PE  Allergies  Allergen Reactions  . Allopurinol Nausea And Vomiting   Patient Measurements: Height: 5' 1" (154.9 cm) Weight: 220 lb 14.4 oz (100.2 kg) IBW/kg (Calculated) : 47.8 Heparin Dosing Weight: 67.7kg  Vital Signs: Temp: 98.4 F (36.9 C) (12/01 1120) Temp Source: Oral (12/01 1120) BP: 159/106 (12/01 0430) Pulse Rate: 80 (12/01 1512)  Labs: Recent Labs    06/14/17 0432 06/14/17 1959 06/15/17 0534 06/16/17 0640 06/16/17 1049  HGB 8.4*  --  9.0*  --  9.0*  HCT 26.3*  --  29.3*  --  29.5*  PLT 267  --  344  --  367  HEPARINUNFRC 0.34  --  0.32 0.55  --   CREATININE 0.63 0.58 0.65  --  0.69    Estimated Creatinine Clearance: 65 mL/min (by C-G formula based on SCr of 0.69 mg/dL).   Medications:  Scheduled:  . albuterol  2.5 mg Nebulization Q6H  . chlorhexidine  15 mL Mouth Rinse BID  . feeding supplement (VITAL HIGH PROTEIN)  1,000 mL Per Tube Q24H  . insulin aspart  0-20 Units Subcutaneous Q4H  . insulin aspart  4 Units Subcutaneous Q4H  . levothyroxine  100 mcg Oral QAC breakfast  . mouth rinse  15 mL Mouth Rinse q12n4p  . metoprolol succinate  100 mg Oral Daily  . pantoprazole sodium  40 mg Per Tube Q1200  . spironolactone  50 mg Oral Daily   Assessment: AH is a 76 YO F with a PMH of PE and DVT (2014). Last dose of Xarelto on 11/20. Admitted due to altered mental status and seizures en route to the hospital. No signs of current bleeding. Hgb trending up, platelets remain stable.   Heparin level remains therapeutic 0.55 heparin drip rate 1400 uts/hr. No bleeding or infusion issues noted, CBC stable.  Goal of Therapy:  Heparin level 0.3-0.7 units/ml Monitor platelets by anticoagulation protocol: Yes   Plan:  Continue heparin gtt to 1400 units/hr  Daily CBC and heparin level Monitor for signs/symptoms of bleeding  Bonnita Nasuti  Pharm.D. CPP, BCPS Clinical Pharmacist [phone] 06/16/2017 4:00 PM

## 2017-06-16 NOTE — Progress Notes (Signed)
PROGRESS NOTE    Amanda Short  JJK:[id number] DOB: [birth year] DOA: 06/06/2017 PCP: System, Pcp Not In   Chief Complaint  Patient presents with  . Altered Mental Status    Brief Narrative:  HPI on 06/06/2017 by Dr. Jennet Maduro (PCCM) Pt is encephelopathic; therefore, this HPI is obtained from chart review. Amanda Short is a 76 y.o. female with PMH as outlined below.  She was brought to [facility] ED 11/21 with AMS.  She was last seen normal 4 - 5 hours prior.  Initially called as code stroke. En route to ED, had seizure for which she was given 2.61m of versed.  In ED, had recurrent CT after returning from CT scan.  Received 610mativan and later had decrease in mental status / post ictal state where she was not protecting airway. She was subsequently intubated and PCCM was asked to admit to the ICU.  Neurology consulted by EDP. No known hx of seizures.  Interim history Patient received trach and transferred to stepdown. [facility] assumed care on 06/15/2017.  Assessment & Plan   Status epilepticus, new seizure disorder -patient presented with seizure and was intubated for airway protection -Neurology consulted and appreciated, suspected low magnesium at to be a possible precipitant  -She was placed on Keppra [id number] -EEG as listed below -Has not had further episodes since admission -will likely need neurology outpatient follow up  Acute on chronic hypoxic respiratory fialure -suspected retropharyngeal edema/vocal cord edema s/p emergent trach on 06/12/17 after failing extubation attempt -Currently not on vent -PCCM continues to follow, plan for change to a cuffless 4 next week -Speech therapy consulted to initiate passy muir trials -Per husband, patient uses 2.5 L of oxygen at home  Chronic Anemia -Possibly gastritis versus GI bleed -Hemoglobin dropped to 7.2, received no blood transfusions -Hemoglobin currently 9 (baseline 10-11) -Patient had a fairly traumatic tracheostomy  placement with some blood loss and has been on heparin -Currently no evidence of bleeding -Continue to monitor CBC  Right lower lobe atelecatsis vs aspiration pneumonia -was placed on vancomycin and Zosyn -Vancomycin discontinued on 06/15/2017  Hypomagnesemia -Magnesium 1.2 today, will replace and continue to monitor   Hypocalcemia -Calcium 8.6, corrected 10.12 -continue to monitor   Hypokalemia -resolved, continue to monitor   Acute encephalopathy -Resolved  Depression/anxiety -Cymbalta held-will restart once patient can tolerate orals, continue Ativan as needed  OSA -Continue trach  History of DVT/PE -Currently on heparin -Xarelto held, will restart once patient is able to tolerate orals  Protein calorie malnutrition -was on tube feeds, however, patient pulled out her NG tube -Speech therapy consulted  Diabetes mellitus, type II -Continue insulin sliding scale with CBG monitoring  Essential hypertension -Continue metoprolol, spironolactone  Hypothyroidism -Continue Synthroid  DVT Prophylaxis  heparin  Code Status: Full  Family Communication: Husband at bedside  Disposition Plan: Admitted. dispo pending   Consultants PCCM Neurology  Procedures  CT HEAD W/O 11/21:  No acute cortically based infarct or intracranial hemorrhage identified. ASPECTS is 10.  Increased chronic nonspecific cerebral volume loss since 2014. 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 PORT CXR 11/22:  Previously reviewed by me. Persistent silhouetting left hemidiaphragm suggestive of opacification versus consolidation. Endotracheal tube in good position. Enteric feeding tube coursing below diaphragm. EEG 11/23: This EEG is indicative of a severe diffuse encephalopathy, non-specific as to etiology. Sedating medication effect cannot be excluded. No epileptiform discharges, seizures, focal or lateralizing signs are seen.Compared to previous epoch, there is minimal  improvement in  the degree of encephalopathy. 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 Sedating medication effectis strongly suspected as the main driver. No epileptiform discharges, seizures, focal or lateralizing signs are seen. MRI BRAIN W/O 11/24: IMPRESSION: 1. No acute abnormality. 2. Chronic ischemic microangiopathy. 3. Advanced cerebral volume loss that is asymmetrically worse on the left to a mild degree.  Antibiotics   Anti-infectives (From admission, onward)   Start     Dose/Rate Route Frequency Ordered Stop   06/13/17 0100  vancomycin (VANCOCIN) IVPB 750 mg/150 ml premix  Status:  Discontinued     750 mg 150 mL/hr over 60 Minutes Intravenous Every 12 hours 06/12/17 1335 06/15/17 1022   06/12/17 1200  piperacillin-tazobactam (ZOSYN) IVPB 3.375 g     3.375 g 12.5 mL/hr over 240 Minutes Intravenous Every 8 hours 06/12/17 1118     06/12/17 1130  vancomycin (VANCOCIN) 1,500 mg in sodium chloride 0.9 % 500 mL IVPB     1,500 mg 250 mL/hr over 120 Minutes Intravenous  Once 06/12/17 1118 06/12/17 1351      Subjective:   Amanda Short seen and examined today.  Would like to eat. Pulled out NG tube overnight. Denies chest pain, shortness of breath, abdominal pain, N/V, dizziness, or headache.     Objective:   Vitals:   06/16/17 0430 06/16/17 0750 06/16/17 0817 06/16/17 1120  BP: (!) 159/106     Pulse: 75 74    Resp: (!) 31 (!) 24    Temp: 98.7 F (37.1 C)  (!) 97.4 F (36.3 C) 98.4 F (36.9 C)  TempSrc: Oral  Axillary Oral  SpO2: 100% 99%    Weight: 100.2 kg (220 lb 14.4 oz)     Height:        Intake/Output Summary (Last 24 hours) at 06/16/2017 1142 Last data filed at 06/16/2017 1000 Gross per 24 hour  Intake 688.5 ml  Output 700 ml  Net -11.5 ml   Filed Weights   06/14/17 0443 06/15/17 0325 06/16/17 0430  Weight: 84.2 kg (185 lb 10 oz) 100.4 kg (221 lb 5.5 oz) 100.2 kg  (220 lb 14.4 oz)   Exam  General: Well developed, well nourished, NAD, appears stated age  21: NCAT, mucous membranes moist.   Neck: Trach  Cardiovascular: S1 S2 auscultated, RRR, no murmurs  Respiratory: Clear to auscultation bilaterally with equal chest rise  Abdomen: Soft, nontender, nondistended, + bowel sounds  Extremities: warm dry without cyanosis clubbing or edema  Neuro: AAOx3, nonfocal  Psych: Appropriate   Data Reviewed: I have personally reviewed following labs and imaging studies  CBC: Recent Labs  Lab 06/10/17 0416 06/11/17 0249 06/12/17 0423 06/13/17 0549 06/14/17 0432 06/15/17 0534 06/16/17 1049  WBC 11.9* 12.6* 11.5* 7.8 10.8* 9.8 9.0  NEUTROABS 9.2* 10.7* 9.2* 5.4  --   --   --   HGB 9.6* 9.5* 9.1* 7.2* 8.4* 9.0* 9.0*  HCT 30.4* 29.6* 28.4* 22.8* 26.3* 29.3* 29.5*  MCV 100.0 97.0 98.6 100.4* 98.5 100.0 101.0*  PLT 186 204 194 211 267 344 347   Basic Metabolic Panel: Recent Labs  Lab 06/10/17 0416  06/11/17 0249 06/12/17 0423 06/14/17 0432 06/14/17 1959 06/15/17 0534 06/16/17 1049  NA 137   < > 138 138 139 140 141 142  K 3.6   < > 3.9 4.0 2.7* 3.8 4.0 3.7  CL 106   < > 101 102 101 103 103 101  CO2 23   < >  _0 32  GLUCOSE 173*   < > 188* 222* 183* 163* 189* 138*  BUN 10   < > _1 CREATININE 0.63   < > 0.60 0.59 0.63 0.58 0.65 0.69  CALCIUM 7.7*   < > 7.9* 8.2* 7.9* 8.2* 8.5* 8.6*  MG 1.5*  --   --   --   --   --   --  1.2*  PHOS 2.8  --  2.9  --   --   --   --   --    < > = values in this interval not displayed.   GFR: Estimated Creatinine Clearance: 65 mL/min (by C-G formula based on SCr of 0.69 mg/dL). Liver Function Tests: Recent Labs  Lab 06/10/17 0416 06/11/17 0249 06/14/17 0432 06/16/17 1049  AST  --   --  20 12*  ALT  --   --  33 22  ALKPHOS  --   --  101 79  BILITOT  --   --  0.5 0.8  PROT  --   --  4.6* 4.9*  ALBUMIN 2.5* 2.5* 2.1* 2.1*   No results for input(s): LIPASE, AMYLASE in the  last 168 hours. No results for input(s): AMMONIA in the last 168 hours. Coagulation Profile: No results for input(s): INR, PROTIME in the last 168 hours. Cardiac Enzymes: No results for input(s): CKTOTAL, CKMB, CKMBINDEX, TROPONINI in the last 168 hours. BNP (last 3 results) No results for input(s): PROBNP in the last 8760 hours. HbA1C: No results for input(s): HGBA1C in the last 72 hours. CBG: Recent Labs  Lab 06/15/17 2044 06/16/17 0006 06/16/17 0428 06/16/17 0821 06/16/17 1123  GLUCAP 201* 136* 121* 119* 119*   Lipid Profile: No results for input(s): CHOL, HDL, LDLCALC, TRIG, CHOLHDL, LDLDIRECT in the last 72 hours. Thyroid Function Tests: No results for input(s): TSH, T4TOTAL, FREET4, T3FREE, THYROIDAB in the last 72 hours. Anemia Panel: No results for input(s): VITAMINB12, FOLATE, FERRITIN, TIBC, IRON, RETICCTPCT in the last 72 hours. Urine analysis:    Component Value Date/Time   COLORURINE STRAW (A) 06/06/2017 1547   APPEARANCEUR CLEAR 06/06/2017 1547   LABSPEC 1.006 06/06/2017 1547   PHURINE 6.0 06/06/2017 1547   GLUCOSEU NEGATIVE 06/06/2017 1547   HGBUR NEGATIVE 06/06/2017 1547   BILIRUBINUR NEGATIVE 06/06/2017 1547   KETONESUR NEGATIVE 06/06/2017 1547   PROTEINUR 30 (A) 06/06/2017 1547   UROBILINOGEN 0.2 03/31/2013 2006   NITRITE NEGATIVE 06/06/2017 1547   LEUKOCYTESUR NEGATIVE 06/06/2017 1547   Sepsis Labs: _2 (procalcitonin:4,lacticidven:4)  ) Recent Results (from the past 240 hour(s))  MRSA PCR Screening     Status: None   Collection Time: 06/06/17  6:00 PM  Result Value Ref Range Status   MRSA by PCR NEGATIVE NEGATIVE Final    Comment:        The GeneXpert MRSA Assay (FDA approved for NASAL specimens only), is one component of a comprehensive MRSA colonization surveillance program. It is not intended to diagnose MRSA infection nor to guide or monitor treatment for MRSA infections.   Culture, respiratory (NON-Expectorated)     Status:  None   Collection Time: 06/13/17 12:12 PM  Result Value Ref Range Status   Specimen Description TRACHEAL ASPIRATE  Final   Special Requests NONE  Final   Gram Stain   Final    RARE WBC PRESENT, PREDOMINANTLY PMN RARE GRAM POSITIVE COCCI IN PAIRS    Culture RARE STREPTOCOCCUS GROUP F  Final  Report Status 06/15/2017 FINAL  Final      Radiology Studies: No results found.   Scheduled Meds: . albuterol  2.5 mg Nebulization Q6H  . chlorhexidine  15 mL Mouth Rinse BID  . feeding supplement (VITAL HIGH PROTEIN)  1,000 mL Per Tube Q24H  . insulin aspart  0-20 Units Subcutaneous Q4H  . insulin aspart  4 Units Subcutaneous Q4H  . levothyroxine  100 mcg Oral QAC breakfast  . mouth rinse  15 mL Mouth Rinse q12n4p  . metoprolol succinate  100 mg Oral Daily  . pantoprazole sodium  40 mg Per Tube Q1200  . spironolactone  50 mg Oral Daily   Continuous Infusions: . heparin 1,400 Units/hr (06/15/17 0400)  . levETIRAcetam Stopped (06/16/17 0331)  . piperacillin-tazobactam (ZOSYN)  IV Stopped (06/16/17 8003)     LOS: 10 days   Time Spent in minutes   30 minutes  Ali Mohl D.O. on 06/16/2017 at 11:42 AM  Between 7am to 7pm - Pager - [phone]  After 7pm go to [email or web address] - password [email or web address]  And look for the night coverage person covering for me after hours  [facility] Office  [phone]

## 2017-06-16 NOTE — Progress Notes (Signed)
Received pt with no cortrak tube. Plan to have speech perform swallow evaluation before decision to reinsert feeding tube. Speech may be able to see client 07/18/16, otherwise they recommend we replace cortrak in AM.

## 2017-06-16 NOTE — Plan of Care (Signed)
Continuing care plan

## 2017-06-16 NOTE — Evaluation (Signed)
Physical Therapy Evaluation Patient Details Name: Amanda Short MRN: [id number] DOB: Nov 08, 1940 Today's Date: 06/16/2017   History of Present Illness  Patient is a 76 yo female admitted 06/06/17 with AMS and seizures.  Patient was intubated to protect airway.  Failed extubation, patient had emergent tracheotomy.   PMH:  anemia, chronic resp failure on 2.5 L O2 at home, depression, anxiety, DM, HTN  Clinical Impression  Patient presents with problems listed below.  Will benefit from acute PT to maximize functional mobility prior to discharge.  Patient was independent pta.  Now requiring assist with all mobility.  Was able to sit in chair for 4 hours today.  Very motivated to improve.  Recommend Inpatient Rehab consult with goal to return home safely with husband.    Follow Up Recommendations CIR;Supervision/Assistance - 24 hour    Equipment Recommendations  None recommended by PT    Recommendations for Other Services Rehab consult     Precautions / Restrictions Precautions Precautions: Fall Precaution Comments: trach Restrictions Weight Bearing Restrictions: No      Mobility  Bed Mobility Overal bed mobility: Needs Assistance Bed Mobility: Rolling Rolling: Min guard         General bed mobility comments: Increased time. Assist for safety.  Patient declined OOB - had been up in chair 4 hours with nursing.  Transfers                 General transfer comment: NT  Ambulation/Gait                Stairs            Wheelchair Mobility    Modified Rankin (Stroke Patients Only)       Balance                                             Pertinent Vitals/Pain Pain Assessment: No/denies pain    Home Living Family/patient expects to be discharged to:: Private residence Living Arrangements: Spouse/significant other Available Help at Discharge: Family;Available 24 hours/day Type of Home: House Home Access: Level entry      Home Layout: One level Home Equipment: Walker - 2 wheels;Shower seat;Cane - single point;Bedside commode      Prior Function Level of Independence: Independent               Hand Dominance        Extremity/Trunk Assessment   Upper Extremity Assessment Upper Extremity Assessment: Defer to OT evaluation    Lower Extremity Assessment Lower Extremity Assessment: Generalized weakness(At least 3/5 strength)       Communication   Communication: Tracheostomy(Answers yes/no and mouths words)  Cognition Arousal/Alertness: Awake/alert Behavior During Therapy: WFL for tasks assessed/performed;Flat affect Overall Cognitive Status: No family/caregiver present to determine baseline cognitive functioning                                 General Comments: Not oriented to time. Able to follow simple commands.      General Comments      Exercises     Assessment/Plan    PT Assessment Patient needs continued PT services  PT Problem List Decreased strength;Decreased activity tolerance;Decreased balance;Decreased mobility;Decreased knowledge of use of DME;Cardiopulmonary status limiting activity       PT Treatment Interventions DME instruction;Gait training;Functional mobility  training;Therapeutic activities;Therapeutic exercise;Balance training;Patient/family education    PT Goals (Current goals can be found in the Care Plan section)  Acute Rehab PT Goals Patient Stated Goal: Get stronger PT Goal Formulation: With patient Time For Goal Achievement: 06/30/17 Potential to Achieve Goals: Good    Frequency Min 3X/week   Barriers to discharge        Co-evaluation               AM-PAC PT "6 Clicks" Daily Activity  Outcome Measure Difficulty turning over in bed (including adjusting bedclothes, sheets and blankets)?: A Little Difficulty moving from lying on back to sitting on the side of the bed? : Unable Difficulty sitting down on and standing up from a  chair with arms (e.g., wheelchair, bedside commode, etc,.)?: Unable Help needed moving to and from a bed to chair (including a wheelchair)?: A Lot Help needed walking in hospital room?: A Lot Help needed climbing 3-5 steps with a railing? : Total 6 Click Score: 10    End of Session Equipment Utilized During Treatment: Oxygen Activity Tolerance: Patient limited by fatigue Patient left: in bed;with call bell/phone within reach Nurse Communication: Other (comment)(Patient up in chair 4 hours - declined OOB at this time.) PT Visit Diagnosis: Unsteadiness on feet (R26.81);Other abnormalities of gait and mobility (R26.89);Muscle weakness (generalized) (M62.81)    Time: 7948-0165 PT Time Calculation (min) (ACUTE ONLY): 10 min   Charges:   PT Evaluation $PT Eval Moderate Complexity: 1 Mod     PT G Codes:        Carita Pian. Sanjuana Kava, [facility] Pager [phone]   Despina Pole 06/16/2017, 4:28 PM

## 2017-06-17 ENCOUNTER — Inpatient Hospital Stay (HOSPITAL_COMMUNITY): Payer: Medicare Other

## 2017-06-17 LAB — BASIC METABOLIC PANEL
ANION GAP: 12 (ref 5–15)
BUN: 8 mg/dL (ref 6–20)
CALCIUM: 8.4 mg/dL — AB (ref 8.9–10.3)
CHLORIDE: 100 mmol/L — AB (ref 101–111)
CO2: 27 mmol/L (ref 22–32)
Creatinine, Ser: 0.75 mg/dL (ref 0.44–1.00)
GFR calc non Af Amer: >60 mL/min (ref >60)
GLUCOSE: 136 mg/dL — AB (ref 65–99)
POTASSIUM: 3.8 mmol/L (ref 3.5–5.1)
Sodium: 139 mmol/L (ref 135–145)

## 2017-06-17 LAB — CBC
HCT: 32.1 % — ABNORMAL LOW (ref 36.0–46.0)
HEMOGLOBIN: 9.8 g/dL — AB (ref 12.0–15.0)
MCH: 31.1 pg (ref 26.0–34.0)
MCHC: 30.5 g/dL (ref 30.0–36.0)
MCV: 101.9 fL — AB (ref 78.0–100.0)
PLATELETS: 418 10*3/uL — AB (ref 150–400)
RBC: 3.15 MIL/uL — AB (ref 3.87–5.11)
RDW: 13.9 % (ref 11.5–15.5)
WBC: 9.2 10*3/uL (ref 4.0–10.5)

## 2017-06-17 LAB — MAGNESIUM: Magnesium: 2 mg/dL (ref 1.7–2.4)

## 2017-06-17 LAB — GLUCOSE, CAPILLARY
GLUCOSE-CAPILLARY: 138 mg/dL — AB (ref 65–99)
GLUCOSE-CAPILLARY: 127 mg/dL — AB (ref 65–99)
GLUCOSE-CAPILLARY: 192 mg/dL — AB (ref 65–99)
Glucose-Capillary: 129 mg/dL — ABNORMAL HIGH (ref 65–99)
Glucose-Capillary: 140 mg/dL — ABNORMAL HIGH (ref 65–99)
Glucose-Capillary: 141 mg/dL — ABNORMAL HIGH (ref 65–99)

## 2017-06-17 LAB — HEPARIN LEVEL (UNFRACTIONATED): Heparin Unfractionated: 0.7 IU/mL (ref 0.30–0.70)

## 2017-06-17 MED ORDER — PANTOPRAZOLE SODIUM 40 MG PO TBEC
40.0000 mg | DELAYED_RELEASE_TABLET | Freq: Every day | ORAL | Status: DC
  Administered 2017-06-17 – 2017-06-23 (×7): 40 mg via ORAL
  Filled 2017-06-17 (×7): qty 1

## 2017-06-17 MED ORDER — HYDROCORTISONE 1 % EX CREA
TOPICAL_CREAM | Freq: Three times a day (TID) | CUTANEOUS | Status: DC | PRN
  Administered 2017-06-17 – 2017-06-20 (×2): via TOPICAL
  Filled 2017-06-17: qty 28

## 2017-06-17 NOTE — Evaluation (Signed)
Clinical/Bedside Swallow Evaluation Patient Details  Name: Amanda Short MRN: [id number] Date of Birth: Nov 08, 1940  Today's Date: 06/17/2017 Time: SLP Start Time (ACUTE ONLY): 0815 SLP Stop Time (ACUTE ONLY): 0835 SLP Time Calculation (min) (ACUTE ONLY): 20 min  Past Medical History:  Past Medical History:  Diagnosis Date  . Arthritis    "back, hands" (07/22/2015)  . Cancer of left breast ([place]) 2006   S/P lumpectomy  . Complication of anesthesia    "brief breathing problem at surgery center in ~ 2005 when I had gallbladder OR"  . Depression   . DVT (deep venous thrombosis) ([place]) 03/2013   LLE  . GERD (gastroesophageal reflux disease)   . Hyperlipidemia   . Hypertension   . Hypothyroidism   . OSA treated with BiPAP   . Pulmonary embolism ([place]) 03/2013  . Thyroid disease    Hypothyroid  . Type II diabetes mellitus ([place])    Past Surgical History:  Past Surgical History:  Procedure Laterality Date  . ABDOMINAL HYSTERECTOMY  1970s  . BREAST BIOPSY Left 2006  . BREAST LUMPECTOMY Left 2006  . BUNIONECTOMY WITH HAMMERTOE RECONSTRUCTION Left   . ESOPHAGOGASTRODUODENOSCOPY N/A 05/29/2017   Procedure: ESOPHAGOGASTRODUODENOSCOPY (EGD);  Surgeon: Irene Shipper, MD;  Location: [facility];  Service: Endoscopy;  Laterality: N/A;  . JOINT REPLACEMENT    . LAPAROSCOPIC CHOLECYSTECTOMY  ~ 2005  . TOTAL HIP ARTHROPLASTY Left 2010  . TOTAL KNEE ARTHROPLASTY Bilateral 2005-2006   HPI:      Assessment / Plan / Recommendation Clinical Impression  Clinical swallowing evaluation was completed using thin liquids and pureed material with PMSV in place.  Oral mechanism exam was completed and unremarkable.  All structures and function appeared to be adequate.  The patient presented with possible oral and pharyngeal dysphagia.  The oral phase was characterized by anterior escape given thin liquids.  The pharyngeal phase was characterized by possible delayed swallow trigger and delayed cough  response given thin liquids.  Recommend that the patient remain NPO pending results of MBS to determine current swallowing physiology and least restrictive diet. SLP Visit Diagnosis: Dysphagia, oropharyngeal phase (R13.12)    Aspiration Risk  Mild aspiration risk    Diet Recommendation   NPO pending results of MBS.    Medication Administration: Via alternative means    Other  Recommendations Oral Care Recommendations: Oral care QID   Follow up Recommendations Inpatient Rehab      Frequency and Duration min 3x week  2 weeks       Prognosis Prognosis for Safe Diet Advancement: Good      Swallow Study   General Date of Onset: 06/06/17 Type of Study: Bedside Swallow Evaluation Previous Swallow Assessment: None previously noted at [facility]. Diet Prior to this Study: NPO Temperature Spikes Noted: No Respiratory Status: Trach Collar History of Recent Intubation: Yes Length of Intubations (days): 6 days Date extubated: 06/12/17(And then with emergent trach placed.  ) Behavior/Cognition: Alert;Cooperative;Pleasant mood Oral Cavity Assessment: Within Functional Limits Oral Care Completed by SLP: No Oral Cavity - Dentition: Adequate natural dentition Vision: Functional for self-feeding Self-Feeding Abilities: Needs assist Patient Positioning: Upright in chair Baseline Vocal Quality: Hoarse Volitional Cough: Other (Comment)(not assessed due to trach)    Oral/Motor/Sensory Function Overall Oral Motor/Sensory Function: Within functional limits   Ice Chips Ice chips: Not tested   Thin Liquid Thin Liquid: Impaired Presentation: Spoon Pharyngeal  Phase Impairments: Cough - Delayed    Nectar Thick Nectar Thick Liquid: Not tested   Honey Thick  Honey Thick Liquid: Not tested   Puree Puree: Within functional limits Presentation: Spoon   Solid   GO   Solid: Not tested        Lamar Sprinkles 06/17/2017,8:42 AM

## 2017-06-17 NOTE — Progress Notes (Signed)
PROGRESS NOTE    Amanda Short  HGD:[id number] DOB: 12-16-1940 DOA: 06/06/2017 PCP: System, Pcp Not In   Chief Complaint  Patient presents with  . Altered Mental Status    Brief Narrative:  HPI on 06/06/2017 by Dr. Jennet Maduro (PCCM) Pt is encephelopathic; therefore, this HPI is obtained from chart review. Amanda Short is a 76 y.o. female with PMH as outlined below.  She was brought [facility] ED 11/21 with AMS.  She was last seen normal 4 - 5 hours prior.  Initially called as code stroke. En route to ED, had seizure for which she was given 2.43m of versed.  In ED, had recurrent CT after returning from CT scan.  Received 646mativan and later had decrease in mental status / post ictal state where she was not protecting airway. She was subsequently intubated and PCCM was asked to admit to the ICU.  Neurology consulted by EDP. No known hx of seizures.  Interim history Patient received trach and transferred to stepdown. TRH assumed care on 06/15/2017.  Assessment & Plan   Status epilepticus, new seizure disorder -patient presented with seizure and was intubated for airway protection -Neurology consulted and appreciated, suspected low magnesium at to be a possible precipitant  -She was placed on Keppra 7504mID -EEG as listed below -Has not had further episodes since admission -will likely need neurology outpatient follow up  Acute on chronic hypoxic respiratory fialure -suspected retropharyngeal edema/vocal cord edema s/p emergent trach on 06/12/17 after failing extubation attempt -Currently not on vent -PCCM continues to follow, plan for change to a cuffless 4 next week -Speech therapy consulted to initiate passy muir trials -Per husband, patient uses 2.5 L of oxygen at home  Chronic Anemia -Possibly gastritis versus GI bleed -Hemoglobin dropped to 7.2, received no blood transfusions -Hemoglobin currently 9.8 (baseline 10-11) -Patient had a fairly traumatic tracheostomy  placement with some blood loss and has been on heparin -Currently no evidence of bleeding -Continue to monitor CBC  Right lower lobe atelecatsis vs aspiration pneumonia -was placed on vancomycin and Zosyn -Vancomycin discontinued on 06/15/2017  Hypomagnesemia -Magnesium 2 after replacement -will continue to monitor   Hypocalcemia -Calcium 8.6, corrected 10.12 -continue to monitor   Hypokalemia -resolved, continue to monitor   Acute encephalopathy -Resolved  Depression/anxiety -Cymbalta held-will restart once patient can tolerate orals, continue Ativan as needed  OSA -Continue trach  History of DVT/PE -Currently on heparin -Xarelto held, will restart once patient is able to tolerate orals  Protein calorie malnutrition -was on tube feeds, however, patient pulled out her NG tube -Speech therapy consulted, plan for MBS today  Diabetes mellitus, type II -Continue insulin sliding scale with CBG monitoring  Essential hypertension -Continue metoprolol, spironolactone  Hypothyroidism -Continue Synthroid  Physical deconditioning -PT consulted, recommended CIR -will consult OT -CIR consulted and pending eval  DVT Prophylaxis  heparin  Code Status: Full  Family Communication: Husband at bedside  Disposition Plan: Admitted. dispo pending- CIR consulted  Consultants PCCM Neurology  Procedures  CT HEAD W/O 11/21:  No acute cortically based infarct or intracranial hemorrhage identified. ASPECTS is 10.  Increased chronic nonspecific cerebral volume loss since 2014. 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 PORT CXR 11/22:  Previously reviewed by me. Persistent silhouetting left hemidiaphragm suggestive of opacification versus consolidation. Endotracheal tube in good position. Enteric feeding tube coursing below diaphragm. EEG 11/23: This EEG is indicative of a severe diffuse encephalopathy, non-specific as to etiology. Sedating medication effect cannot  be  excluded. No epileptiform discharges, seizures, focal or lateralizing signs are seen.Compared to previous epoch, there is minimal improvement in the degree of encephalopathy. 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 Sedating medication effectis strongly suspected as the main driver. No epileptiform discharges, seizures, focal or lateralizing signs are seen. MRI BRAIN W/O 11/24: IMPRESSION: 1. No acute abnormality. 2. Chronic ischemic microangiopathy. 3. Advanced cerebral volume loss that is asymmetrically worse on the left to a mild degree.  Antibiotics   Anti-infectives (From admission, onward)   Start     Dose/Rate Route Frequency Ordered Stop   06/13/17 0100  vancomycin (VANCOCIN) IVPB 750 mg/150 ml premix  Status:  Discontinued     750 mg 150 mL/hr over 60 Minutes Intravenous Every 12 hours 06/12/17 1335 06/15/17 1022   06/12/17 1200  piperacillin-tazobactam (ZOSYN) IVPB 3.375 g     3.375 g 12.5 mL/hr over 240 Minutes Intravenous Every 8 hours 06/12/17 1118     06/12/17 1130  vancomycin (VANCOCIN) 1,500 mg in sodium chloride 0.9 % 500 mL IVPB     1,500 mg 250 mL/hr over 120 Minutes Intravenous  Once 06/12/17 1118 06/12/17 1351      Subjective:   Amanda Short seen and examined today.  Has no complaints today. Denies chest pain, shortness of breath, abdominal pain, N/V/D/C.   Objective:   Vitals:   06/17/17 0302 06/17/17 0324 06/17/17 0710 06/17/17 0925  BP:  (!) 176/98    Pulse: 68 80  78  Resp: (!) _0 Temp:  97.9 F (36.6 C) 98.5 F (36.9 C)   TempSrc:  Oral Oral   SpO2: 100% 100%  100%  Weight:  101.1 kg (222 lb 14.2 oz)    Height:        Intake/Output Summary (Last 24 hours) at 06/17/2017 1019 Last data filed at 06/16/2017 1629 Gross per 24 hour  Intake 377 ml  Output 0 ml  Net 377 ml   Filed Weights   06/15/17 0325 06/16/17 0430 06/17/17 0324  Weight:  100.4 kg (221 lb 5.5 oz) 100.2 kg (220 lb 14.4 oz) 101.1 kg (222 lb 14.2 oz)   Exam  General: Well developed, well nourished, NAD, appears stated age  25: NCAT, mucous membranes moist.   Neck: Trach  Cardiovascular: S1 S2 auscultated, RRR, no murmurs  Respiratory: Clear to auscultation bilaterally with equal chest rise  Abdomen: Soft, nontender, nondistended, + bowel sounds  Extremities: warm dry without cyanosis clubbing or edema  Neuro: AAOx3, nonfocal (able to mouth answers to questions)  Psych: Appropriate  Data Reviewed: I have personally reviewed following labs and imaging studies  CBC: Recent Labs  Lab 06/11/17 0249 06/12/17 0423 06/13/17 0549 06/14/17 0432 06/15/17 0534 06/16/17 1049 06/17/17 0353  WBC 12.6* 11.5* 7.8 10.8* 9.8 9.0 9.2  NEUTROABS 10.7* 9.2* 5.4  --   --   --   --   HGB 9.5* 9.1* 7.2* 8.4* 9.0* 9.0* 9.8*  HCT 29.6* 28.4* 22.8* 26.3* 29.3* 29.5* 32.1*  MCV 97.0 98.6 100.4* 98.5 100.0 101.0* 101.9*  PLT 204 194 211 267 344 367 245*   Basic Metabolic Panel: Recent Labs  Lab 06/11/17 0249  06/14/17 0432 06/14/17 1959 06/15/17 0534 06/16/17 1049 06/17/17 0353  NA 138   < > 139 140 141 142 139  K 3.9   < > 2.7* 3.8 4.0 3.7 3.8  CL 101   < > 101 103 103  101 100*  CO2 29   < > _0 32 27  GLUCOSE 188*   < > 183* 163* 189* 138* 136*  BUN 7   < > _1 CREATININE 0.60   < > 0.63 0.58 0.65 0.69 0.75  CALCIUM 7.9*   < > 7.9* 8.2* 8.5* 8.6* 8.4*  MG  --   --   --   --   --  1.2* 2.0  PHOS 2.9  --   --   --   --   --   --    < > = values in this interval not displayed.   GFR: Estimated Creatinine Clearance: 65.3 mL/min (by C-G formula based on SCr of 0.75 mg/dL). Liver Function Tests: Recent Labs  Lab 06/11/17 0249 06/14/17 0432 06/16/17 1049  AST  --  20 12*  ALT  --  33 22  ALKPHOS  --  101 79  BILITOT  --  0.5 0.8  PROT  --  4.6* 4.9*  ALBUMIN 2.5* 2.1* 2.1*   No results for input(s): LIPASE, AMYLASE in the last  168 hours. No results for input(s): AMMONIA in the last 168 hours. Coagulation Profile: No results for input(s): INR, PROTIME in the last 168 hours. Cardiac Enzymes: No results for input(s): CKTOTAL, CKMB, CKMBINDEX, TROPONINI in the last 168 hours. BNP (last 3 results) No results for input(s): PROBNP in the last 8760 hours. HbA1C: No results for input(s): HGBA1C in the last 72 hours. CBG: Recent Labs  Lab 06/16/17 1627 06/16/17 2015 06/17/17 0010 06/17/17 0326 06/17/17 0851  GLUCAP 132* 120* 141* 129* 140*   Lipid Profile: No results for input(s): CHOL, HDL, LDLCALC, TRIG, CHOLHDL, LDLDIRECT in the last 72 hours. Thyroid Function Tests: No results for input(s): TSH, T4TOTAL, FREET4, T3FREE, THYROIDAB in the last 72 hours. Anemia Panel: No results for input(s): VITAMINB12, FOLATE, FERRITIN, TIBC, IRON, RETICCTPCT in the last 72 hours. Urine analysis:    Component Value Date/Time   COLORURINE STRAW (A) 06/06/2017 1547   APPEARANCEUR CLEAR 06/06/2017 1547   LABSPEC 1.006 06/06/2017 1547   PHURINE 6.0 06/06/2017 1547   GLUCOSEU NEGATIVE 06/06/2017 1547   HGBUR NEGATIVE 06/06/2017 1547   BILIRUBINUR NEGATIVE 06/06/2017 1547   KETONESUR NEGATIVE 06/06/2017 1547   PROTEINUR 30 (A) 06/06/2017 1547   UROBILINOGEN 0.2 03/31/2013 2006   NITRITE NEGATIVE 06/06/2017 1547   LEUKOCYTESUR NEGATIVE 06/06/2017 1547   Sepsis Labs: _2 (procalcitonin:4,lacticidven:4)  ) Recent Results (from the past 240 hour(s))  Culture, respiratory (NON-Expectorated)     Status: None   Collection Time: 06/13/17 12:12 PM  Result Value Ref Range Status   Specimen Description TRACHEAL ASPIRATE  Final   Special Requests NONE  Final   Gram Stain   Final    RARE WBC PRESENT, PREDOMINANTLY PMN RARE GRAM POSITIVE COCCI IN PAIRS    Culture RARE STREPTOCOCCUS GROUP F  Final   Report Status 06/15/2017 FINAL  Final      Radiology Studies: No results found.   Scheduled Meds: . albuterol  2.5  mg Nebulization Q6H  . chlorhexidine  15 mL Mouth Rinse BID  . feeding supplement (VITAL HIGH PROTEIN)  1,000 mL Per Tube Q24H  . insulin aspart  0-20 Units Subcutaneous Q4H  . insulin aspart  4 Units Subcutaneous Q4H  . levothyroxine  100 mcg Oral QAC breakfast  . mouth rinse  15 mL Mouth Rinse q12n4p  . metoprolol succinate  100 mg Oral Daily  .  pantoprazole sodium  40 mg Per Tube Q1200  . spironolactone  50 mg Oral Daily   Continuous Infusions: . heparin 1,400 Units/hr (06/16/17 1600)  . levETIRAcetam Stopped (06/17/17 0400)  . piperacillin-tazobactam (ZOSYN)  IV 3.375 g (06/17/17 0344)     LOS: 11 days   Time Spent in minutes   30 minutes  Treyshon Buchanon D.O. on 06/17/2017 at 10:19 AM  Between 7am to 7pm - Pager - [phone]  After 7pm go to [email or web address] - password [email or web address]  And look for the night coverage person covering for me after hours  [facility] Office  [phone]

## 2017-06-17 NOTE — Progress Notes (Signed)
Patient requesting anxiety medication more frequently today. Husband inquired about resuming previous medication for anxiety.

## 2017-06-17 NOTE — Clinical Social Work Note (Signed)
Clinical Social Work Assessment  Patient Details  Name: Amanda Short MRN: [id number] Date of Birth: 1940/12/09  Date of referral:  06/17/17               Reason for consult:  Facility Placement, Discharge Planning                Permission sought to share information with:  Facility Sport and exercise psychologist, Family Supports Permission granted to share information::  Yes, Verbal Permission Granted  Name::     [place]::  SNF's  Relationship::  Husband  Contact Information:  [phone]  Housing/Transportation Living arrangements for the past 2 months:  Single Family Home Source of Information:  Patient, Medical Team, Spouse Patient Interpreter Needed:  None Criminal Activity/Legal Involvement Pertinent to Current Situation/Hospitalization:  No - Comment as needed Significant Relationships:  Spouse Lives with:  Spouse Do you feel safe going back to the place where you live?  Yes Need for family participation in patient care:  Yes (Comment)  Care giving concerns:  PT recommending CIR once medically stable. CSW initiating SNF backup referral process.   Social Worker assessment / plan:  CSW met with patient. Husband at bedside. CSW introduced role and explained that PT recommendations would be discussed. Patient was about to get a breathing treatment so CSW and husband spoke in hall. Patient's husband hopes that she can go to CIR. Discussed potential barriers. If she is unable to go to CIR, first preference SNF is [facility] since it is so close to their home. CSW will follow for MBS results. Unable to submit [place] screening since [place] website is down this weekend. No further concerns. CSW encouraged patient and her husband to contact CSW as needed. CSW will continue to follow patient and her husband for support and facilitate discharge to SNF, if needed, once medically stable.  Employment status:  Retired Nurse, adult PT Recommendations:   Inpatient [place] / Referral to community resources:  [place]  Patient/Family's Response to care:  Patient's husband agreeable to SNF if she cannot go to [facility]. Patient's husband supportive and involved in patient's care. Patient's husband appreciated social work intervention.  Patient/Family's Understanding of and Emotional Response to Diagnosis, Current Treatment, and Prognosis:  Patient and her husband have a good understanding of the reason for admission and her need for rehab prior to returning home. Patient and her husband appear happy with hospital care.  Emotional Assessment Appearance:  Appears stated age Attitude/Demeanor/Rapport:  Other(Pleasant) Affect (typically observed):  Accepting, Appropriate, Calm, Pleasant Orientation:  Oriented to Self, Oriented to Place, Oriented to  Time, Oriented to Situation Alcohol / Substance use:  Never Used Psych involvement (Current and /or in the community):  No (Comment)  Discharge Needs  Concerns to be addressed:  Care Coordination Readmission within the last 30 days:  Yes Current discharge risk:  Dependent with Mobility Barriers to Discharge:  Awaiting State Approval ([place]), Continued Medical Work up   Candie Chroman, LCSW 06/17/2017, 10:12 AM

## 2017-06-17 NOTE — Progress Notes (Addendum)
Modified Barium Swallow Progress Note  Patient Details  Name: Amanda Short MRN: [id number] Date of Birth: 1940-07-24  Today's Date: 06/17/2017  Modified Barium Swallow completed.  Full report located under Chart Review in the Imaging Section.  Brief recommendations include the following:  Clinical Impression  MBS was completed using thin liquids, nectar thick liquids, honey thick liquids, pureed material and solids.  The patient presented with a mild pharyngeal dysphagia characterized by delayed initiation of the swallow which lead to flash penetration just prior to the swallow given thin and nectar thick liquids.  This material was observed to completely clear the laryngeal vestibule.  Chin tuck was attempted and ineffective to prevent.  Prominent cricopharyngeus was noted that was not causing any functional issues.  Esophageal sweep did not reveal overt issues.  Recommend a regular diet with thin liquids.  The patient needs to be totally upright for all intake and should take one, small sip at a time.  PMSV should be worn with meals.  NO straws and medications should be whole in pureed material.  If patient has a decline in respiratory status honey thick liquids may need to be considered.   ST will follow up for therapeutic diet tolerance and swallowing therapy.     Swallow Evaluation Recommendations       SLP Diet Recommendations: Regular solids;Thin liquid   Liquid Administration via: Cup;No straw   Medication Administration: Whole meds with puree   Supervision: Patient able to self feed   Compensations: Slow rate;Small sips/bites(One small sip at a time.  NO serial sip/swallows.)   Postural Changes: Seated upright at 90 degrees   Oral Care Recommendations: Oral care BID       Shelly Flatten, MA, CCC-SLP Acute Rehab SLP [phone] Lamar Sprinkles 06/17/2017,12:33 PM

## 2017-06-17 NOTE — Progress Notes (Signed)
ANTICOAGULATION CONSULT NOTE - Follow Up Consult  Pharmacy Consult for Heparin while rivaroxaban on hold Indication: Hx of PE  Allergies  Allergen Reactions  . Allopurinol Nausea And Vomiting   Patient Measurements: Height: 5' 1" (154.9 cm) Weight: 222 lb 14.2 oz (101.1 kg) IBW/kg (Calculated) : 47.8 Heparin Dosing Weight: 67.7kg  Vital Signs: Temp: 98.5 F (36.9 C) (12/02 1221) Temp Source: Oral (12/02 1221) BP: 152/87 (12/02 1221) Pulse Rate: 81 (12/02 1221)  Labs: Recent Labs    06/15/17 0534 06/16/17 0640 06/16/17 1049 06/17/17 0353  HGB 9.0*  --  9.0* 9.8*  HCT 29.3*  --  29.5* 32.1*  PLT 344  --  367 418*  HEPARINUNFRC 0.32 0.55  --  0.70  CREATININE 0.65  --  0.69 0.75    Estimated Creatinine Clearance: 65.3 mL/min (by C-G formula based on SCr of 0.75 mg/dL).   Medications:  Scheduled:  . albuterol  2.5 mg Nebulization Q6H  . chlorhexidine  15 mL Mouth Rinse BID  . feeding supplement (VITAL HIGH PROTEIN)  1,000 mL Per Tube Q24H  . insulin aspart  0-20 Units Subcutaneous Q4H  . insulin aspart  4 Units Subcutaneous Q4H  . levothyroxine  100 mcg Oral QAC breakfast  . mouth rinse  15 mL Mouth Rinse q12n4p  . metoprolol succinate  100 mg Oral Daily  . pantoprazole sodium  40 mg Per Tube Q1200  . spironolactone  50 mg Oral Daily   Assessment: AH is a 76 YO F with a PMH of PE and DVT (2014). Last dose of Xarelto on 11/20. Admitted due to altered mental status and seizures en route to the hospital. No signs of current bleeding. Hgb trending up, platelets remain stable.   Heparin level therapeutic but jump from 0.55> 0.7 on same heparin drip rate 1400 uts/hr. Heparin running in CL, heparin levels drawn peripherally confirmed by patient and RN. No bleeding or infusion issues noted, CBC stable.  Goal of Therapy:  Heparin level 0.3-0.7 units/ml Monitor platelets by anticoagulation protocol: Yes   Plan:  Decrease heparin gtt to 1300 units/hr  Daily CBC and  heparin level Monitor for signs/symptoms of bleeding  Bonnita Nasuti Pharm.D. CPP, BCPS Clinical Pharmacist [phone] 06/17/2017 1:01 PM

## 2017-06-18 DIAGNOSIS — K5732 Diverticulitis of large intestine without perforation or abscess without bleeding: Secondary | ICD-10-CM

## 2017-06-18 DIAGNOSIS — I10 Essential (primary) hypertension: Secondary | ICD-10-CM

## 2017-06-18 DIAGNOSIS — E119 Type 2 diabetes mellitus without complications: Secondary | ICD-10-CM

## 2017-06-18 DIAGNOSIS — Z452 Encounter for adjustment and management of vascular access device: Secondary | ICD-10-CM

## 2017-06-18 DIAGNOSIS — R0682 Tachypnea, not elsewhere classified: Secondary | ICD-10-CM

## 2017-06-18 DIAGNOSIS — D62 Acute posthemorrhagic anemia: Secondary | ICD-10-CM

## 2017-06-18 DIAGNOSIS — Z4659 Encounter for fitting and adjustment of other gastrointestinal appliance and device: Secondary | ICD-10-CM

## 2017-06-18 DIAGNOSIS — G40901 Epilepsy, unspecified, not intractable, with status epilepticus: Secondary | ICD-10-CM

## 2017-06-18 DIAGNOSIS — Z430 Encounter for attention to tracheostomy: Secondary | ICD-10-CM

## 2017-06-18 DIAGNOSIS — Z86711 Personal history of pulmonary embolism: Secondary | ICD-10-CM

## 2017-06-18 DIAGNOSIS — J690 Pneumonitis due to inhalation of food and vomit: Secondary | ICD-10-CM

## 2017-06-18 DIAGNOSIS — G9340 Encephalopathy, unspecified: Secondary | ICD-10-CM

## 2017-06-18 LAB — HEPARIN LEVEL (UNFRACTIONATED): Heparin Unfractionated: 0.85 IU/mL — ABNORMAL HIGH (ref 0.30–0.70)

## 2017-06-18 LAB — CBC
HEMATOCRIT: 29.7 % — AB (ref 36.0–46.0)
HEMOGLOBIN: 9.2 g/dL — AB (ref 12.0–15.0)
MCH: 31 pg (ref 26.0–34.0)
MCHC: 31 g/dL (ref 30.0–36.0)
MCV: 100 fL (ref 78.0–100.0)
Platelets: 444 10*3/uL — ABNORMAL HIGH (ref 150–400)
RBC: 2.97 MIL/uL — AB (ref 3.87–5.11)
RDW: 13.9 % (ref 11.5–15.5)
WBC: 10.5 10*3/uL (ref 4.0–10.5)

## 2017-06-18 LAB — BASIC METABOLIC PANEL
Anion gap: 7 (ref 5–15)
BUN: 7 mg/dL (ref 6–20)
CHLORIDE: 100 mmol/L — AB (ref 101–111)
CO2: 33 mmol/L — AB (ref 22–32)
CREATININE: 0.75 mg/dL (ref 0.44–1.00)
Calcium: 8.7 mg/dL — ABNORMAL LOW (ref 8.9–10.3)
GFR calc Af Amer: >60 mL/min (ref >60)
GFR calc non Af Amer: >60 mL/min (ref >60)
GLUCOSE: 195 mg/dL — AB (ref 65–99)
POTASSIUM: 3.6 mmol/L (ref 3.5–5.1)
Sodium: 140 mmol/L (ref 135–145)

## 2017-06-18 LAB — GLUCOSE, CAPILLARY
GLUCOSE-CAPILLARY: 147 mg/dL — AB (ref 65–99)
GLUCOSE-CAPILLARY: 189 mg/dL — AB (ref 65–99)
Glucose-Capillary: 180 mg/dL — ABNORMAL HIGH (ref 65–99)
Glucose-Capillary: 187 mg/dL — ABNORMAL HIGH (ref 65–99)
Glucose-Capillary: 117 mg/dL — ABNORMAL HIGH (ref 65–99)

## 2017-06-18 MED ORDER — DULOXETINE HCL 60 MG PO CPEP
60.0000 mg | ORAL_CAPSULE | Freq: Every day | ORAL | Status: DC
  Administered 2017-06-18 – 2017-06-23 (×6): 60 mg via ORAL
  Filled 2017-06-18 (×6): qty 1

## 2017-06-18 MED ORDER — RIVAROXABAN 20 MG PO TABS
20.0000 mg | ORAL_TABLET | Freq: Every day | ORAL | Status: DC
  Administered 2017-06-18 – 2017-06-22 (×5): 20 mg via ORAL
  Filled 2017-06-18 (×6): qty 1

## 2017-06-18 MED ORDER — INSULIN ASPART 100 UNIT/ML ~~LOC~~ SOLN
0.0000 [IU] | Freq: Three times a day (TID) | SUBCUTANEOUS | Status: DC
  Administered 2017-06-19 (×2): 2 [IU] via SUBCUTANEOUS
  Administered 2017-06-19: 1 [IU] via SUBCUTANEOUS
  Administered 2017-06-20: 2 [IU] via SUBCUTANEOUS
  Administered 2017-06-20: 1 [IU] via SUBCUTANEOUS
  Administered 2017-06-20: 3 [IU] via SUBCUTANEOUS
  Administered 2017-06-21: 1 [IU] via SUBCUTANEOUS
  Administered 2017-06-21 (×2): 2 [IU] via SUBCUTANEOUS
  Administered 2017-06-22: 5 [IU] via SUBCUTANEOUS
  Administered 2017-06-22 – 2017-06-23 (×2): 3 [IU] via SUBCUTANEOUS
  Administered 2017-06-23: 1 [IU] via SUBCUTANEOUS

## 2017-06-18 MED ORDER — FUROSEMIDE 10 MG/ML IJ SOLN
40.0000 mg | Freq: Two times a day (BID) | INTRAMUSCULAR | Status: AC
  Administered 2017-06-18 – 2017-06-20 (×4): 40 mg via INTRAVENOUS
  Filled 2017-06-18 (×4): qty 4

## 2017-06-18 MED ORDER — RIVAROXABAN 15 MG PO TABS: 15.0000 mg | ORAL_TABLET | Freq: Every day | ORAL | Status: DC

## 2017-06-18 MED ORDER — ACETAMINOPHEN 325 MG PO TABS
650.0000 mg | ORAL_TABLET | ORAL | Status: DC | PRN
  Administered 2017-06-18 – 2017-06-23 (×8): 650 mg via ORAL
  Filled 2017-06-18 (×10): qty 2

## 2017-06-18 NOTE — Consult Note (Signed)
Physical Medicine and Rehabilitation Consult   Reason for Consult: Status epilepticus with VDRF and debility.  Referring Physician: Dr. Ree Kida.    HPI: Amanda Short is a 76 y.o. female with history of HTN, T2DM, h/o PE, recent bout with diverticulitis 11/11 to 05/30/17; who was admitted on 06/06/17 with AMS with weakness left side and left gaze deviation. Stat CT negative reviewed, unremarkable for acute process. Seizure activity in Xray with difficulty protecting airway and was intubated. She was loaded with keppra and required multiple doses IV ativan due to status epilepticus. Low Ca/Mg levels supplemented and LTM EEG showed evidence of severe diffuse encephalopathy. Hospital course significant for agitation requiring precedex as well as restraints, inability to tolerate extubation requiring emergent tracheostomy at bedside due to stridor/marked swelling of VC on 06/12/17. She was stated on IV antibiotics for aspiration PNA and weaned to ATC. Swallow evaluation done and she was started on regular diet with recommendations to downgrade to thickened liquids ins case of respiratory decline. PT evaluation attempted but patient declined as had been up in chair for 4 hours with nursing. CIR recommended due to debility.    Review of Systems  HENT: Negative for hearing loss.   Respiratory: Positive for cough.   Cardiovascular: Negative for palpitations.  Gastrointestinal: Negative for heartburn and nausea.  Genitourinary: Negative for dysuria and urgency.  Musculoskeletal: Positive for back pain, joint pain and myalgias.  Neurological: Positive for seizures, weakness (past few weeks since discharge) and headaches. Negative for focal weakness.  Psychiatric/Behavioral: Negative for memory loss. The patient is not nervous/anxious.   All other systems reviewed and are negative.     Past Medical History:  Diagnosis Date  . Arthritis    "back, hands" (07/22/2015)  . Cancer of left breast  ([place]) 2006   S/P lumpectomy  . Complication of anesthesia    "brief breathing problem at surgery center in ~ 2005 when I had gallbladder OR"  . Depression   . DVT (deep venous thrombosis) ([place]) 03/2013   LLE  . GERD (gastroesophageal reflux disease)   . Hyperlipidemia   . Hypertension   . Hypothyroidism   . OSA treated with BiPAP   . Pulmonary embolism ([place]) 03/2013  . Thyroid disease    Hypothyroid  . Type II diabetes mellitus ([place])     Past Surgical History:  Procedure Laterality Date  . ABDOMINAL HYSTERECTOMY  1970s  . BREAST BIOPSY Left 2006  . BREAST LUMPECTOMY Left 2006  . BUNIONECTOMY WITH HAMMERTOE RECONSTRUCTION Left   . ESOPHAGOGASTRODUODENOSCOPY N/A 05/29/2017   Procedure: ESOPHAGOGASTRODUODENOSCOPY (EGD);  Surgeon: Irene Shipper, MD;  Location: [facility];  Service: Endoscopy;  Laterality: N/A;  . JOINT REPLACEMENT    . LAPAROSCOPIC CHOLECYSTECTOMY  ~ 2005  . TOTAL HIP ARTHROPLASTY Left 2010  . TOTAL KNEE ARTHROPLASTY Bilateral 2005-2006    Family History  Problem Relation Age of Onset  . Diabetes Unknown   . Diabetes Mellitus II Sister     Social History:  Married. Independent with walker PTA. Per  reports that she has quit smoking. Her smoking use included cigarettes. She has a 30.00 pack-year smoking history. she has never used smokeless tobacco. She reports that she does not drink alcohol or use drugs.     Allergies  Allergen Reactions  . Allopurinol Nausea And Vomiting    Medications Prior to Admission  Medication Sig Dispense Refill  . calcium-vitamin D (OSCAL 500/200 D-3) 500-200 MG-UNIT tablet Take 1 tablet 2 (two)  times daily by mouth. 20 tablet 0  . DULoxetine (CYMBALTA) 60 MG capsule Take 60 mg by mouth daily.    . febuxostat (ULORIC) 40 MG tablet Take 40 mg daily by mouth.    . levothyroxine (SYNTHROID, LEVOTHROID) 100 MCG tablet Take 100 mcg by mouth daily before breakfast.    . metFORMIN (GLUCOPHAGE) 500 MG tablet Take 500 mg by mouth 2  (two) times daily with a meal.     . metoprolol (TOPROL-XL) 200 MG 24 hr tablet Take 100 mg by mouth at bedtime.     . montelukast (SINGULAIR) 10 MG tablet Take 10 mg at bedtime by mouth.    . Multiple Vitamin (MULTIVITAMIN WITH MINERALS) TABS tablet Take 1 tablet daily by mouth.    Marland Kitchen omeprazole (PRILOSEC) 40 MG capsule Take 40 mg by mouth 2 (two) times daily.    . Rivaroxaban (XARELTO) 15 MG TABS tablet Take 15 mg by mouth at bedtime.     Marland Kitchen spironolactone (ALDACTONE) 25 MG tablet Take 50 mg daily by mouth.    Marland Kitchen amoxicillin-clavulanate (AUGMENTIN) 875-125 MG tablet Take 1 tablet every 12 (twelve) hours by mouth. (Patient not taking: Reported on 06/06/2017) 10 tablet 0  . furosemide (LASIX) 40 MG tablet Take 1 tablet (40 mg total) daily by mouth. Hold until follow up with PCP      Home: Home Living Family/patient expects to be discharged to:: Private residence Living Arrangements: Spouse/significant other Available Help at Discharge: Family, Available 24 hours/day Type of Home: House Home Access: Level entry [place]: One [place]: Environmental consultant - 2 wheels, Shower seat, Cane - single point, Bedside commode  Functional History: Prior Function Level of Independence: Independent Functional Status:  Mobility: Bed Mobility Overal bed mobility: Needs Assistance Bed Mobility: Rolling Rolling: Min guard General bed mobility comments: Increased time. Assist for safety.  Patient declined OOB - had been up in chair 4 hours with nursing. Transfers General transfer comment: NT      ADL:    Cognition: Cognition Overall Cognitive Status: No family/caregiver present to determine baseline cognitive functioning Orientation Level: Oriented X4 Cognition Arousal/Alertness: Awake/alert Behavior During Therapy: WFL for tasks assessed/performed, Flat affect Overall Cognitive Status: No family/caregiver present to determine baseline cognitive functioning General Comments: Not oriented to  time. Able to follow simple commands.   Blood pressure (!) 153/86, pulse 75, temperature 98.7 F (37.1 C), temperature source Oral, resp. rate 18, height _0  (1.549 m), weight 102.7 kg (226 lb 6.6 oz), SpO2 98 %. Physical Exam  Nursing note and vitals reviewed. Constitutional: She is oriented to person, place, and time. She appears well-developed and well-nourished. She is sleeping. She is easily aroused.  HENT:  Head: Normocephalic and atraumatic.  Eyes: Conjunctivae and EOM are normal. Pupils are equal, round, and reactive to light. Right eye exhibits no discharge. Left eye exhibits no discharge.  Neck: Normal range of motion.  Cuffed #6 trach in place with ATC.  Cardiovascular: Normal rate and regular rhythm.  Respiratory: Effort normal and breath sounds normal. No stridor. No respiratory distress. She exhibits no tenderness.  +[place]  GI: Soft. Bowel sounds are normal. She exhibits no distension. There is no tenderness.  Musculoskeletal: She exhibits no edema or tenderness.  Neurological: She is alert, oriented to person, place, and time and easily aroused.  Right gaze preference but able to turn to the left with minimal cues.  Able to follow commands. Motor: 4/5 grossly throughout  Skin: Skin is warm and dry.  Psychiatric: She has a normal mood and affect. Her behavior is normal.    Results for orders placed or performed during the hospital encounter of 06/06/17 (from the past 24 hour(s))  Glucose, capillary     Status: Abnormal   Collection Time: 06/17/17  8:51 AM  Result Value Ref Range   Glucose-Capillary 140 (H) 65 - 99 mg/dL  Glucose, capillary     Status: Abnormal   Collection Time: 06/17/17 12:19 PM  Result Value Ref Range   Glucose-Capillary 138 (H) 65 - 99 mg/dL  Glucose, capillary     Status: Abnormal   Collection Time: 06/17/17  4:24 PM  Result Value Ref Range   Glucose-Capillary 127 (H) 65 - 99 mg/dL   Comment 1 Notify RN    Comment 2 Document in Chart     Glucose, capillary     Status: Abnormal   Collection Time: 06/17/17  9:04 PM  Result Value Ref Range   Glucose-Capillary 192 (H) 65 - 99 mg/dL  Heparin level (unfractionated)     Status: Abnormal   Collection Time: 06/18/17  7:38 AM  Result Value Ref Range   Heparin Unfractionated 0.85 (H) 0.30 - 0.70 IU/mL   Dg Swallowing Func-speech Pathology  Result Date: 06/17/2017 Objective Swallowing Evaluation: Type of Study: MBS-Modified Barium Swallow Study  Patient Details Name: Amanda Short MRN: [id number] Date of Birth: April 21, 1941 Today's Date: 06/17/2017 Time: SLP Start Time (ACUTE ONLY): 1155 -SLP Stop Time (ACUTE ONLY): 1220 SLP Time Calculation (min) (ACUTE ONLY): 25 min Past Medical History: Past Medical History: Diagnosis Date . Arthritis   "back, hands" (07/22/2015) . Cancer of left breast ([place]) 2006  S/P lumpectomy . Complication of anesthesia   "brief breathing problem at surgery center in ~ 2005 when I had gallbladder OR" . Depression  . DVT (deep venous thrombosis) ([place]) 03/2013  LLE . GERD (gastroesophageal reflux disease)  . Hyperlipidemia  . Hypertension  . Hypothyroidism  . OSA treated with BiPAP  . Pulmonary embolism ([place]) 03/2013 . Thyroid disease   Hypothyroid . Type II diabetes mellitus ([place])  Past Surgical History: Past Surgical History: Procedure Laterality Date . ABDOMINAL HYSTERECTOMY  1970s . BREAST BIOPSY Left 2006 . BREAST LUMPECTOMY Left 2006 . BUNIONECTOMY WITH HAMMERTOE RECONSTRUCTION Left  . ESOPHAGOGASTRODUODENOSCOPY N/A 05/29/2017  Procedure: ESOPHAGOGASTRODUODENOSCOPY (EGD);  Surgeon: Irene Shipper, MD;  Location: [facility];  Service: Endoscopy;  Laterality: N/A; . JOINT REPLACEMENT   . LAPAROSCOPIC CHOLECYSTECTOMY  ~ 2005 . TOTAL HIP ARTHROPLASTY Left 2010 . TOTAL KNEE ARTHROPLASTY Bilateral 2005-2006 HPI: 76 y.o.female with history of depression, DVT with pulmonary embolism, OSA, and diabetes mellitus type 2 presented on 11/21 with altered mentation, status elipticus.   MRI without obvious focal source. ETT 11/21; extubated 11/27, but with immediate stridor/vocal fold edema; unable to reintubate and required emergent trach.  Subjective: The patient was seen in radiology.  Assessment / Plan / Recommendation CHL IP CLINICAL IMPRESSIONS 06/17/2017 Clinical Impression MBS was completed using thin liquids, nectar thick liquids, honey thick liquids, pureed material and solids.  The patient presented with a mild pharyngeal dysphagia characterized by delayed initiation of the swallow which lead to flash penetration just prior to the swallow given thin and nectar thick liquids.  This material was observed to completely clear the laryngeal vestibule.  Chin tuck was attempted and ineffective to prevent.  Prominent cricopharyngeus was noted that was not causing any functional issues.  Esophageal sweep did not reveal overt issues.  Recommend a regular diet with thin  liquids.  The patient needs to be totally upright for all intake and should take one, small sip at a time.  NO straws and medications should be whole in pureed material.  ST will follow up for therapeutic diet tolerance and swallowing therapy.   SLP Visit Diagnosis Dysphagia, pharyngeal phase (R13.13) Attention and concentration deficit following -- Frontal lobe and executive function deficit following -- Impact on safety and function Mild aspiration risk   CHL IP TREATMENT RECOMMENDATION 06/17/2017 Treatment Recommendations Therapy as outlined in treatment plan below   Prognosis 06/17/2017 Prognosis for Safe Diet Advancement Good Barriers to Reach Goals -- Barriers/Prognosis Comment -- CHL IP DIET RECOMMENDATION 06/17/2017 SLP Diet Recommendations Regular solids;Thin liquid Liquid Administration via Cup;No straw Medication Administration Whole meds with puree Compensations Slow rate;Small sips/bites Postural Changes Seated upright at 90 degrees   CHL IP OTHER RECOMMENDATIONS 06/17/2017 Recommended Consults -- Oral Care Recommendations  Oral care BID Other Recommendations --   CHL IP FOLLOW UP RECOMMENDATIONS 06/17/2017 Follow up Recommendations Inpatient Rehab   CHL IP FREQUENCY AND DURATION 06/17/2017 Speech Therapy Frequency (ACUTE ONLY) min 3x week Treatment Duration 2 weeks      CHL IP ORAL PHASE 06/17/2017 Oral Phase WFL Oral - Pudding Teaspoon -- Oral - Pudding Cup -- Oral - Honey Teaspoon -- Oral - Honey Cup -- Oral - Nectar Teaspoon -- Oral - Nectar Cup -- Oral - Nectar Straw -- Oral - Thin Teaspoon -- Oral - Thin Cup -- Oral - Thin Straw -- Oral - Puree -- Oral - Mech Soft -- Oral - Regular -- Oral - Multi-Consistency -- Oral - Pill -- Oral Phase - Comment --  CHL IP PHARYNGEAL PHASE 06/17/2017 Pharyngeal Phase Impaired Pharyngeal- Pudding Teaspoon -- Pharyngeal -- Pharyngeal- Pudding Cup -- Pharyngeal -- Pharyngeal- Honey Teaspoon -- Pharyngeal -- Pharyngeal- Honey Cup Delayed swallow initiation-vallecula Pharyngeal -- Pharyngeal- Nectar Teaspoon -- Pharyngeal -- Pharyngeal- Nectar Cup Delayed swallow initiation-pyriform sinuses Pharyngeal -- Pharyngeal- Nectar Straw -- Pharyngeal -- Pharyngeal- Thin Teaspoon Delayed swallow initiation-pyriform sinuses;Penetration/Aspiration before swallow Pharyngeal Material enters airway, remains ABOVE vocal cords then ejected out Pharyngeal- Thin Cup Delayed swallow initiation-pyriform sinuses;Penetration/Aspiration before swallow Pharyngeal Material enters airway, remains ABOVE vocal cords then ejected out Pharyngeal- Thin Straw Delayed swallow initiation-pyriform sinuses;Penetration/Aspiration before swallow Pharyngeal Material enters airway, remains ABOVE vocal cords then ejected out Pharyngeal- Puree -- Pharyngeal -- Pharyngeal- Mechanical Soft -- Pharyngeal -- Pharyngeal- Regular -- Pharyngeal -- Pharyngeal- Multi-consistency -- Pharyngeal -- Pharyngeal- Pill -- Pharyngeal -- Pharyngeal Comment --  Shelly Flatten, MA, CCC-SLP Acute Rehab SLP [phone] Lamar Sprinkles 06/17/2017, 12:35 PM                Assessment/Plan: Diagnosis: Debility Labs and images independently reviewed.  Records reviewed and summated above.  1. Does the need for close, 24 hr/day medical supervision in concert with the patient's rehab needs make it unreasonable for this patient to be served in a less intensive setting? Yes  2. Co-Morbidities requiring supervision/potential complications: aspiration PNA (cont abx), severe diffuse encephalopathy, status epilepticus (cont meds), HTN (monitor and provide prns in accordance with increased physical exertion and pain), T2 DM (Monitor in accordance with exercise and adjust meds as necessary), h/o PE (transition from heparin ggt when appropriate), diverticulitis, tachypnea (monitor RR and O2 Sats with increased physical exertion), ABLA (transfuse if necessary to ensure appropriate perfusion for increased activity tolerance) 3. Due to safety, skin/wound care, disease management, pain management and patient education, does the patient require 24 hr/day rehab nursing?  Yes 4. Does the patient require coordinated care of a physician, rehab nurse, PT (1-2 hrs/day, 5 days/week), OT (1-2 hrs/day, 5 days/week) and SLP (1-2 hrs/day, 5 days/week) to address physical and functional deficits in the context of the above medical diagnosis(es)? Yes Addressing deficits in the following areas: balance, endurance, locomotion, strength, transferring, bowel/bladder control, bathing, dressing, toileting, cognition, speech, swallowing and psychosocial support 5. Can the patient actively participate in an intensive therapy program of at least 3 hrs of therapy per day at least 5 days per week? Potentially 6. The potential for patient to make measurable gains while on inpatient rehab is excellent 7. Anticipated functional outcomes upon discharge from inpatient rehab are supervision  with PT, supervision with OT, supervision with SLP. 8. Estimated rehab length of stay to reach the above functional goals is:  14-18 days. 9. Anticipated D/C setting: Home 10. Anticipated post D/C treatments: HH therapy and Home excercise program 11. Overall Rehab/Functional Prognosis: good  RECOMMENDATIONS: This patient's condition is appropriate for continued rehabilitative care in the following setting: CIR in the near future Patient has agreed to participate in recommended program. Yes Note that insurance prior authorization may be required for reimbursement for recommended care.  Comment: Rehab Admissions Coordinator to follow up.  Delice Lesch, MD, ABPMR [place], [place] 06/18/2017

## 2017-06-18 NOTE — Progress Notes (Signed)
  Speech Language Pathology Treatment: Dysphagia;Passy Muir Speaking valve  Patient Details Name: Amanda Short MRN: [id number] DOB: 1940/12/22 Today's Date: 06/18/2017 Time: 9574-7340 SLP Time Calculation (min) (ACUTE ONLY): 24 min  Assessment / Plan / Recommendation Clinical Impression  Pt able to self feed breakfast with PMSV donned on arrival. Vocal quality raspy, clear with adequate intensity; vital signs stable and donned with appropriate respiratory and phonatory coordination. Reviewed, with pt and spouse, need for deflated cuff (cuff remains down) and remove during sleep. SLP will teach spouse to donn/doff at later session.  Required moderate cued for smaller bites, slower rate and no phonating immediately after swallow. Delayed cough 2-3 times (strong and likely effective if penetrating). Minimal left pocketing and cues to use lingual sweep. She requires full supervision for precautions. Continue regular texture, thin liquids, donn PMV with meals/meds. Making progress toward goals.   HPI HPI: 76 y.o.female with history of depression, DVT with pulmonary embolism, OSA, and diabetes mellitus type 2 presented on 11/21 with altered mentation, status elipticus.  MRI without obvious focal source. ETT 11/21; extubated 11/27, but with immediate stridor/vocal fold edema; unable to reintubate and required emergent trach.       SLP Plan  Continue with current plan of care       Recommendations  Diet recommendations: Regular;Thin liquid Liquids provided via: Cup;No straw Medication Administration: Crushed with puree(whole in applesauce if unable to crush) Supervision: Patient able to self feed;Full supervision/cueing for compensatory strategies Compensations: Slow rate;Small sips/bites(PMV with meals) Postural Changes and/or Swallow Maneuvers: Seated upright 90 degrees      Patient may use Passy-Muir Speech Valve: During all waking hours (remove during sleep);Intermittently with  supervision PMSV Supervision: Intermittent MD: Please consider changing trach tube to : Cuffless         Oral Care Recommendations: Oral care BID Follow up Recommendations: Inpatient Rehab SLP Visit Diagnosis: Dysphagia, pharyngeal phase (R13.13) Plan: Continue with current plan of care       GO                Amanda Short 06/18/2017, 9:03 AM  Amanda Short.Ed [employer] [phone]

## 2017-06-18 NOTE — Progress Notes (Addendum)
Nutrition Follow-up  DOCUMENTATION CODES:   Obesity unspecified  INTERVENTION:   -MVI daily  NUTRITION DIAGNOSIS:   Increased nutrient needs related to acute illness as evidenced by estimated needs.  Ongoing  GOAL:   Patient will meet greater than or equal to 90% of their needs  Progressing  MONITOR:   PO intake, Supplement acceptance, Diet advancement, Labs, Weight trends, Skin, I & O's  REASON FOR ASSESSMENT:   Consult Enteral/tube feeding initiation and management  ASSESSMENT:   76 yo female with PMH of breast CA (2016), HLD, HTN, depression, DVT, pulmonary embolism, OSA, DM-2, who was admitted on 11/21 with status epilepticus. Required intubation on admission.  11/27- trach placved 11/28- cortrak tube placed 11.30- PSMV trials initiated, pt removed cortrak tube 12/2- s/p MBSS, advanced to regular diet with thin liquids  Per doc flowsheets, pt has not required vent since 06/12/17.   Pt working with therapies at time of visit.   Per SLP notes, pt is making good progress towards goals. Pt with good oral intake; meal completion 75%. Per PCCM notes, considering trach downsize at end of the week.   Labs reviewed: CBGS: 300-923 (inpatient orders for glycemic control are 0-20 units insulin aspart every 4 hours, 4 units insulin aspart every 4 hours).   Diet Order:  DIET DYS 3 Room service appropriate? Yes; Fluid consistency: Thin  EDUCATION NEEDS:   No education needs have been identified at this time  Skin:  Skin Assessment: Reviewed RN Assessment  Last BM:  06/17/17  Height:   Ht Readings from Last 1 Encounters:  06/09/17 5' 1" (1.549 m)    Weight:   Wt Readings from Last 1 Encounters:  06/18/17 226 lb 6.6 oz (102.7 kg)    Ideal Body Weight:  47.7 kg  BMI:  Body mass index is 42.78 kg/m.  Estimated Nutritional Needs:   Kcal:  1400-1600  Protein:  95-110 grams  Fluid:  > 1.4 L    Corday Wyka A. Jimmye Norman, RD, LDN, CDE Pager: [phone] After  hours Pager: [phone]

## 2017-06-18 NOTE — Progress Notes (Addendum)
[facility]  ADMISSION DATE:  06/06/2017  REFERRING MD:  Zenovia Jarred, M.D. / EDP   CHIEF COMPLAINT:  Status Epilepticus   Presenting HPI:  76 y.o. female with history of depression, DVT with pulmonary embolism, OSA, and diabetes mellitus type 2 presenting on 11/21 with altered mentation to the emergency department. Initially patient was called as a code stroke. In route to the emergency department she had a seizure and was given 2.5 mg of Versed. In the emergency department patient was noted to have recurrent seizure activity after returning from brain imaging. She received a total of 6 mg of Ativan and subsequently had altered level of consciousness in her postictal and sedated state. Patient was not protecting airway and was subsequently endotracheally intubated. Patient was admitted to the ICU on continuous EEG monitoring. Has no known history of seizures.  Subjective:   Continues to tolerate ATC; however, at 60% FiO2 this AM.  No acute events.  Temp:  [98 F (36.7 C)-98.7 F (37.1 C)] 98.7 F (37.1 C) (12/03 0743) Pulse Rate:  [71-87] 72 (12/03 0952) Resp:  [15-24] 23 (12/03 0952) BP: (129-169)/(79-106) 153/86 (12/03 0743) SpO2:  [84 %-100 %] 84 % (12/03 0952) FiO2 (%):  [40 %-60 %] 60 % (12/03 0952) Weight:  [102.7 kg (226 lb 6.6 oz)] 102.7 kg (226 lb 6.6 oz) (12/03 0326)  FiO2 (%):  [40 %-60 %] 60 %   Intake/Output Summary (Last 24 hours) at 06/18/2017 1026 Last data filed at 06/17/2017 2050 Gross per 24 hour  Intake 509.88 ml  Output 500 ml  Net 9.88 ml   Physical Exam: Gen:      Adult female, resting in recliner, in NAD HEENT:  [place]/AT, PERRL, EOM-I and MMM Neck:      Supple, no masses Lungs:    CTA bilaterally CV:         RRR, Nl S1/S2 and -M/R/G Abd:      + bowel sounds; soft, non-tender; no palpable masses, no distension Ext:    No edema; adequate peripheral perfusion Skin:      Warm and dry; no rash Neuro:  alert and oriented x  3   LINES/TUBES: OETT 11/21 >>> 11/27 Foley 11/21 >>> 11/27 OGT 11/21 >>> 11/27 PIV  CBC Recent Labs    06/16/17 1049 06/17/17 0353  WBC 9.0 9.2  HGB 9.0* 9.8*  HCT 29.5* 32.1*  PLT 367 418*    Coag's No results for input(s): APTT, INR in the last 72 hours.  BMET Recent Labs    06/16/17 1049 06/17/17 0353  NA 142 139  K 3.7 3.8  CL 101 100*  CO2 32 27  BUN 6 8  CREATININE 0.69 0.75  GLUCOSE 138* 136*    Electrolytes Recent Labs    06/16/17 1049 06/17/17 0353  CALCIUM 8.6* 8.4*  MG 1.2* 2.0    Sepsis Markers No results for input(s): PROCALCITON, O2SATVEN in the last 72 hours.  Invalid input(s): LACTICACIDVEN  ABG No results for input(s): PHART, PCO2ART, PO2ART in the last 72 hours.  Liver Enzymes Recent Labs    06/16/17 1049  AST 12*  ALT 22  ALKPHOS 79  BILITOT 0.8  ALBUMIN 2.1*    Cardiac Enzymes No results for input(s): TROPONINI, PROBNP in the last 72 hours.  Glucose Recent Labs    06/17/17 0326 06/17/17 0851 06/17/17 1219 06/17/17 1624 06/17/17 2104 06/18/17 0833  GLUCAP 129* 140* 138* 127* 192* 147*    Imaging Dg Swallowing Func-speech  Pathology  Result Date: 06/17/2017 Objective Swallowing Evaluation: Type of Study: MBS-Modified Barium Swallow Study  Patient Details Name: Amanda Short MRN: [id number] Date of Birth: 05/08/1941 Today's Date: 06/17/2017 Time: SLP Start Time (ACUTE ONLY): 1155 -SLP Stop Time (ACUTE ONLY): 1220 SLP Time Calculation (min) (ACUTE ONLY): 25 min Past Medical History: Past Medical History: Diagnosis Date . Arthritis   "back, hands" (07/22/2015) . Cancer of left breast ([place]) 2006  S/P lumpectomy . Complication of anesthesia   "brief breathing problem at surgery center in ~ 2005 when I had gallbladder OR" . Depression  . DVT (deep venous thrombosis) ([place]) 03/2013  LLE . GERD (gastroesophageal reflux disease)  . Hyperlipidemia  . Hypertension  . Hypothyroidism  . OSA treated with BiPAP  . Pulmonary embolism  ([place]) 03/2013 . Thyroid disease   Hypothyroid . Type II diabetes mellitus ([place])  Past Surgical History: Past Surgical History: Procedure Laterality Date . ABDOMINAL HYSTERECTOMY  1970s . BREAST BIOPSY Left 2006 . BREAST LUMPECTOMY Left 2006 . BUNIONECTOMY WITH HAMMERTOE RECONSTRUCTION Left  . ESOPHAGOGASTRODUODENOSCOPY N/A 05/29/2017  Procedure: ESOPHAGOGASTRODUODENOSCOPY (EGD);  Surgeon: Irene Shipper, MD;  Location: [facility];  Service: Endoscopy;  Laterality: N/A; . JOINT REPLACEMENT   . LAPAROSCOPIC CHOLECYSTECTOMY  ~ 2005 . TOTAL HIP ARTHROPLASTY Left 2010 . TOTAL KNEE ARTHROPLASTY Bilateral 2005-2006 HPI: 76 y.o.female with history of depression, DVT with pulmonary embolism, OSA, and diabetes mellitus type 2 presented on 11/21 with altered mentation, status elipticus.  MRI without obvious focal source. ETT 11/21; extubated 11/27, but with immediate stridor/vocal fold edema; unable to reintubate and required emergent trach.  Subjective: The patient was seen in radiology.  Assessment / Plan / Recommendation CHL IP CLINICAL IMPRESSIONS 06/17/2017 Clinical Impression MBS was completed using thin liquids, nectar thick liquids, honey thick liquids, pureed material and solids.  The patient presented with a mild pharyngeal dysphagia characterized by delayed initiation of the swallow which lead to flash penetration just prior to the swallow given thin and nectar thick liquids.  This material was observed to completely clear the laryngeal vestibule.  Chin tuck was attempted and ineffective to prevent.  Prominent cricopharyngeus was noted that was not causing any functional issues.  Esophageal sweep did not reveal overt issues.  Recommend a regular diet with thin liquids.  The patient needs to be totally upright for all intake and should take one, small sip at a time.  NO straws and medications should be whole in pureed material.  ST will follow up for therapeutic diet tolerance and swallowing therapy.   SLP Visit  Diagnosis Dysphagia, pharyngeal phase (R13.13) Attention and concentration deficit following -- Frontal lobe and executive function deficit following -- Impact on safety and function Mild aspiration risk   CHL IP TREATMENT RECOMMENDATION 06/17/2017 Treatment Recommendations Therapy as outlined in treatment plan below   Prognosis 06/17/2017 Prognosis for Safe Diet Advancement Good Barriers to Reach Goals -- Barriers/Prognosis Comment -- CHL IP DIET RECOMMENDATION 06/17/2017 SLP Diet Recommendations Regular solids;Thin liquid Liquid Administration via Cup;No straw Medication Administration Whole meds with puree Compensations Slow rate;Small sips/bites Postural Changes Seated upright at 90 degrees   CHL IP OTHER RECOMMENDATIONS 06/17/2017 Recommended Consults -- Oral Care Recommendations Oral care BID Other Recommendations --   CHL IP FOLLOW UP RECOMMENDATIONS 06/17/2017 Follow up Recommendations Inpatient Rehab   CHL IP FREQUENCY AND DURATION 06/17/2017 Speech Therapy Frequency (ACUTE ONLY) min 3x week Treatment Duration 2 weeks      CHL IP ORAL PHASE 06/17/2017 Oral Phase WFL Oral -  Pudding Teaspoon -- Oral - Pudding Cup -- Oral - Honey Teaspoon -- Oral - Honey Cup -- Oral - Nectar Teaspoon -- Oral - Nectar Cup -- Oral - Nectar Straw -- Oral - Thin Teaspoon -- Oral - Thin Cup -- Oral - Thin Straw -- Oral - Puree -- Oral - Mech Soft -- Oral - Regular -- Oral - Multi-Consistency -- Oral - Pill -- Oral Phase - Comment --  CHL IP PHARYNGEAL PHASE 06/17/2017 Pharyngeal Phase Impaired Pharyngeal- Pudding Teaspoon -- Pharyngeal -- Pharyngeal- Pudding Cup -- Pharyngeal -- Pharyngeal- Honey Teaspoon -- Pharyngeal -- Pharyngeal- Honey Cup Delayed swallow initiation-vallecula Pharyngeal -- Pharyngeal- Nectar Teaspoon -- Pharyngeal -- Pharyngeal- Nectar Cup Delayed swallow initiation-pyriform sinuses Pharyngeal -- Pharyngeal- Nectar Straw -- Pharyngeal -- Pharyngeal- Thin Teaspoon Delayed swallow initiation-pyriform  sinuses;Penetration/Aspiration before swallow Pharyngeal Material enters airway, remains ABOVE vocal cords then ejected out Pharyngeal- Thin Cup Delayed swallow initiation-pyriform sinuses;Penetration/Aspiration before swallow Pharyngeal Material enters airway, remains ABOVE vocal cords then ejected out Pharyngeal- Thin Straw Delayed swallow initiation-pyriform sinuses;Penetration/Aspiration before swallow Pharyngeal Material enters airway, remains ABOVE vocal cords then ejected out Pharyngeal- Puree -- Pharyngeal -- Pharyngeal- Mechanical Soft -- Pharyngeal -- Pharyngeal- Regular -- Pharyngeal -- Pharyngeal- Multi-consistency -- Pharyngeal -- Pharyngeal- Pill -- Pharyngeal -- Pharyngeal Comment --  Shelly Flatten, MA, CCC-SLP Acute Rehab SLP [phone] Lamar Sprinkles 06/17/2017, 12:35 PM                IMAGING/STUDIES: CT HEAD W/O 11/21:  No acute cortically based infarct or intracranial hemorrhage identified. ASPECTS is 10.  Increased chronic nonspecific cerebral volume loss since 2014. 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 PORT CXR 11/22:  Previously reviewed by me. Persistent silhouetting left hemidiaphragm suggestive of opacification versus consolidation. Endotracheal tube in good position. Enteric feeding tube coursing below diaphragm. EEG 11/23: This EEG is indicative of a severe diffuse encephalopathy, non-specific as to etiology. Sedating medication effect cannot be excluded. No epileptiform discharges, seizures, focal or lateralizing signs are seen. Compared to previous epoch, there is minimal improvement in the degree of encephalopathy. EEG 11/24:  This EEG is indicative of a variable diffuse encephalopathy, alternating between mild and awake to severe and comatose. Sedating medication effect is strongly suspected as the main driver. No epileptiform discharges, seizures, focal or lateralizing signs are seen.  MRI BRAIN W/O 11/24: no acute process  MICROBIOLOGY: MRSA PCR  11/21:  Negative  Sputum 11/28>>> neg  ANTIBIOTICS: Vancomycin 11/27>>> 11/30 Zosyn 11/27>>> 11/30  SIGNIFICANT EVENTS: 11/21 - Admit with status epilepticus  11/22 - Taken off Propofol 11/24 - Following commands more consistently. Continuous EEG removed & MRI pending 11/25 - No cuff leak >> decadron & lasix 11/27 - Extubated, Emergent tracheostomy after extubation due to stridor and vocal cord edema/spasm  ASSESSMENT/PLAN:  76 y.o. female admitted with status epilepticus. MRI without obvious focal source. Hemoglobin stable on systemic and coagulation.  Status post emergent tracheostomy.  Progressing well to trach collar. Discussed with PCCM-NP.  Acute respiratory failure due to post ictal state and upper airway obstruction - s/p emergent trach 11/27  - Trach in place  - Continue PMV per speech  - Will re-eval for trach downsize to 4 cuffless later this week.  Holding off for now given higher O2 needs (at 60% FiO2).  - Anticipate if vocal cords improve to decannulate quickly  Hypoxemia:  - Titrate O2 for sat of 88-92%   Seizure: neuro signed off  - Nothing further to add.  PCCM will  see 2x weekly.  Please call if needed sooner.   Montey Hora, [facility] Pager: [phone]  or [phone] 06/18/2017, 10:32 AM   STAFF NOTE: I, Merrie Roof, MD FACP have personally reviewed patient's available data, including medical history, events of note, physical examination and test results as part of my evaluation. I have discussed with resident/NP and other care providers such as pharmacist, RN and RRT. In addition, I personally evaluated patient and elicited key findings of: awake, calm on TC, she does NOT phonate, jvd wnl, trach sutured clean, crackles slight coarse, some edema, abdo soft, pcxr last done I reviewed that showed int prominence, she now is on 60% fio2 hence would escalate lasix to neg balance, likely explains slight rise in  O2 needs, she does not speak, O2 need up, NON change in trach planned as of now, caution any PMV done with obstruction at cord level, hope to be able to simply titrate O2 needs to 40% over next 24-48 hours, I updated pt in room  Amanda Short. Titus Mould, MD, [place] Pgr: [facility] 06/18/2017 12:43 PM

## 2017-06-18 NOTE — Progress Notes (Signed)
PROGRESS NOTE    Amanda Short  ZOX:[id number] DOB: 09-12-1940 DOA: 06/06/2017 PCP: System, Pcp Not In   Chief Complaint  Patient presents with  . Altered Mental Status    Brief Narrative:  HPI on 06/06/2017 by Dr. Jennet Maduro (PCCM) Pt is encephelopathic; therefore, this HPI is obtained from chart review. Amanda Short is a 76 y.o. female with PMH as outlined below.  She was brought to [facility] ED 11/21 with AMS.  She was last seen normal 4 - 5 hours prior.  Initially called as code stroke. En route to ED, had seizure for which she was given 2.38m of versed.  In ED, had recurrent CT after returning from CT scan.  Received 645mativan and later had decrease in mental status / post ictal state where she was not protecting airway. She was subsequently intubated and PCCM was asked to admit to the ICU.  Neurology consulted by EDP. No known hx of seizures.  Interim history Patient received trach and transferred to stepdown. TRH assumed care on 06/15/2017.  Assessment & Plan   Status epilepticus, new seizure disorder -patient presented with seizure and was intubated for airway protection -Neurology consulted and appreciated, suspected low magnesium at to be a possible precipitant  -She was placed on Keppra 75072mID -EEG as listed below -Has not had further episodes since admission -will likely need neurology outpatient follow up  Acute on chronic hypoxic respiratory fialure -suspected retropharyngeal edema/vocal cord edema s/p emergent trach on 06/12/17 after failing extubation attempt -Currently not on vent -PCCM continues to follow, plan for change to downsize to 4 cuffless trach this week -Speech therapy consulted to initiate passy muir trials -Per husband, patient uses 2.5 L of oxygen at home  Chronic Anemia -Possibly gastritis versus GI bleed -Hemoglobin dropped to 7.2, received no blood transfusions -Hemoglobin upto 9.8 (baseline 10-11) -Patient had a fairly traumatic  tracheostomy placement with some blood loss and has been on heparin -Currently no evidence of bleeding -Continue to monitor CBC  Right lower lobe atelecatsis vs aspiration pneumonia -was placed on vancomycin (discontinued on 11/30) and Zosyn (discontinued 12/3) -Vancomycin discontinued on 06/15/2017  Hypomagnesemia -Magnesium 2 after replacement -will continue to monitor   Hypocalcemia -Calcium 8.6, corrected 10.12 -continue to monitor   Hypokalemia -resolved, continue to monitor   Acute encephalopathy -Resolved  Depression/anxiety -Cymbalta held-will restart once patient can tolerate orals, continue Ativan as needed  OSA -Continue trach  History of DVT/PE -Currently on heparin -Xarelto held however will restart today  Protein calorie malnutrition -was on tube feeds, however, patient pulled out her NG tube -Speech therapy consulted, now on dysphagia 3 diet  Diabetes mellitus, type II -Continue insulin sliding scale with CBG monitoring  Essential hypertension -Continue metoprolol, spironolactone  Hypothyroidism -Continue Synthroid  Physical deconditioning -PT consulted, recommended CIR -OT pending -CIR consulted and pending eval  DVT Prophylaxis  heparin  Code Status: Full  Family Communication: Husband at bedside  Disposition Plan: Admitted. dispo pending- CIR consulted  Consultants PCCM Neurology  Procedures  CT HEAD W/O 11/21:  No acute cortically based infarct or intracranial hemorrhage identified. ASPECTS is 10.  Increased chronic nonspecific cerebral volume loss since 2014. 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 PORT CXR 11/22:  Previously reviewed by me. Persistent silhouetting left hemidiaphragm suggestive of opacification versus consolidation. Endotracheal tube in good position. Enteric feeding tube coursing below diaphragm. EEG 11/23: This EEG is indicative of a severe diffuse encephalopathy, non-specific as to etiology.  Sedating medication effect  cannot be excluded. No epileptiform discharges, seizures, focal or lateralizing signs are seen.Compared to previous epoch, there is minimal improvement in the degree of encephalopathy. 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 Sedating medication effectis strongly suspected as the main driver. No epileptiform discharges, seizures, focal or lateralizing signs are seen. MRI BRAIN W/O 11/24: IMPRESSION: 1. No acute abnormality. 2. Chronic ischemic microangiopathy. 3. Advanced cerebral volume loss that is asymmetrically worse on the left to a mild degree.  Antibiotics   Anti-infectives (From admission, onward)   Start     Dose/Rate Route Frequency Ordered Stop   06/13/17 0100  vancomycin (VANCOCIN) IVPB 750 mg/150 ml premix  Status:  Discontinued     750 mg 150 mL/hr over 60 Minutes Intravenous Every 12 hours 06/12/17 1335 06/15/17 1022   06/12/17 1200  piperacillin-tazobactam (ZOSYN) IVPB 3.375 g     3.375 g 12.5 mL/hr over 240 Minutes Intravenous Every 8 hours 06/12/17 1118     06/12/17 1130  vancomycin (VANCOCIN) 1,500 mg in sodium chloride 0.9 % 500 mL IVPB     1,500 mg 250 mL/hr over 120 Minutes Intravenous  Once 06/12/17 1118 06/12/17 1351      Subjective:   Amanda Short seen and examined today.  Happy she was able to eat yesterday. Has no complaints today. Denies chest pain, shortness of breath, abdominal pain, N/V/D/C.   Objective:   Vitals:   06/18/17 0326 06/18/17 0743 06/18/17 0818 06/18/17 0952  BP: (!) 169/91 (!) 153/86    Pulse: 76 71 75 72  Resp: (!) 24 18  (!) 23  Temp: 98 F (36.7 C) 98.7 F (37.1 C)    TempSrc: Oral Oral    SpO2: 95% 98%  (!) 84%  Weight: 102.7 kg (226 lb 6.6 oz)     Height:        Intake/Output Summary (Last 24 hours) at 06/18/2017 1149 Last data filed at 06/17/2017 2050 Gross per 24 hour  Intake 509.88 ml  Output 500  ml  Net 9.88 ml   Filed Weights   06/16/17 0430 06/17/17 0324 06/18/17 0326  Weight: 100.2 kg (220 lb 14.4 oz) 101.1 kg (222 lb 14.2 oz) 102.7 kg (226 lb 6.6 oz)   Exam  General: Well developed, well nourished, NAD, appears stated age  54: NCAT, mucous membranes moist.   Neck: Trach  Cardiovascular: S1 S2 auscultated, RRR, no murmurs  Respiratory: Clear to auscultation bilaterally with equal chest rise  Abdomen: Soft, nontender, nondistended, + bowel sounds  Extremities: warm dry without cyanosis clubbing or edema  Neuro: AAOx3, nonfocal  Psych: Appropriate mood and affect, pleasant  Data Reviewed: I have personally reviewed following labs and imaging studies  CBC: Recent Labs  Lab 06/12/17 0423 06/13/17 0549 06/14/17 0432 06/15/17 0534 06/16/17 1049 06/17/17 0353  WBC 11.5* 7.8 10.8* 9.8 9.0 9.2  NEUTROABS 9.2* 5.4  --   --   --   --   HGB 9.1* 7.2* 8.4* 9.0* 9.0* 9.8*  HCT 28.4* 22.8* 26.3* 29.3* 29.5* 32.1*  MCV 98.6 100.4* 98.5 100.0 101.0* 101.9*  PLT 194 211 267 344 367 975*   Basic Metabolic Panel: Recent Labs  Lab 06/14/17 0432 06/14/17 1959 06/15/17 0534 06/16/17 1049 06/17/17 0353  NA 139 140 141 142 139  K 2.7* 3.8 4.0 3.7 3.8  CL 101 103 103 101 100*  CO2 _0 32 27  GLUCOSE 183* 163* 189* 138* 136*  BUN _0 CREATININE 0.63 0.58 0.65 0.69 0.75  CALCIUM 7.9* 8.2* 8.5* 8.6* 8.4*  MG  --   --   --  1.2* 2.0   GFR: Estimated Creatinine Clearance: 65.9 mL/min (by C-G formula based on SCr of 0.75 mg/dL). Liver Function Tests: Recent Labs  Lab 06/14/17 0432 06/16/17 1049  AST 20 12*  ALT 33 22  ALKPHOS 101 79  BILITOT 0.5 0.8  PROT 4.6* 4.9*  ALBUMIN 2.1* 2.1*   No results for input(s): LIPASE, AMYLASE in the last 168 hours. No results for input(s): AMMONIA in the last 168 hours. Coagulation Profile: No results for input(s): INR, PROTIME in the last 168 hours. Cardiac Enzymes: No results for input(s): CKTOTAL, CKMB,  CKMBINDEX, TROPONINI in the last 168 hours. BNP (last 3 results) No results for input(s): PROBNP in the last 8760 hours. HbA1C: No results for input(s): HGBA1C in the last 72 hours. CBG: Recent Labs  Lab 06/17/17 0851 06/17/17 1219 06/17/17 1624 06/17/17 2104 06/18/17 0833  GLUCAP 140* 138* 127* 192* 147*   Lipid Profile: No results for input(s): CHOL, HDL, LDLCALC, TRIG, CHOLHDL, LDLDIRECT in the last 72 hours. Thyroid Function Tests: No results for input(s): TSH, T4TOTAL, FREET4, T3FREE, THYROIDAB in the last 72 hours. Anemia Panel: No results for input(s): VITAMINB12, FOLATE, FERRITIN, TIBC, IRON, RETICCTPCT in the last 72 hours. Urine analysis:    Component Value Date/Time   COLORURINE STRAW (A) 06/06/2017 1547   APPEARANCEUR CLEAR 06/06/2017 1547   LABSPEC 1.006 06/06/2017 1547   PHURINE 6.0 06/06/2017 1547   GLUCOSEU NEGATIVE 06/06/2017 1547   HGBUR NEGATIVE 06/06/2017 1547   BILIRUBINUR NEGATIVE 06/06/2017 1547   KETONESUR NEGATIVE 06/06/2017 1547   PROTEINUR 30 (A) 06/06/2017 1547   UROBILINOGEN 0.2 03/31/2013 2006   NITRITE NEGATIVE 06/06/2017 1547   LEUKOCYTESUR NEGATIVE 06/06/2017 1547   Sepsis Labs: _1 (procalcitonin:4,lacticidven:4)  ) Recent Results (from the past 240 hour(s))  Culture, respiratory (NON-Expectorated)     Status: None   Collection Time: 06/13/17 12:12 PM  Result Value Ref Range Status   Specimen Description TRACHEAL ASPIRATE  Final   Special Requests NONE  Final   Gram Stain   Final    RARE WBC PRESENT, PREDOMINANTLY PMN RARE GRAM POSITIVE COCCI IN PAIRS    Culture RARE STREPTOCOCCUS GROUP F  Final   Report Status 06/15/2017 FINAL  Final      Radiology Studies: Dg Swallowing Func-speech Pathology  Result Date: 06/17/2017 Objective Swallowing Evaluation: Type of Study: MBS-Modified Barium Swallow Study  Patient Details Name: Amanda Short MRN: [id number] Date of Birth: 1941-05-09 Today's Date: 06/17/2017 Time: SLP Start  Time (ACUTE ONLY): 1155 -SLP Stop Time (ACUTE ONLY): 1220 SLP Time Calculation (min) (ACUTE ONLY): 25 min Past Medical History: Past Medical History: Diagnosis Date . Arthritis   "back, hands" (07/22/2015) . Cancer of left breast ([place]) 2006  S/P lumpectomy . Complication of anesthesia   "brief breathing problem at surgery center in ~ 2005 when I had gallbladder OR" . Depression  . DVT (deep venous thrombosis) ([place]) 03/2013  LLE . GERD (gastroesophageal reflux disease)  . Hyperlipidemia  . Hypertension  . Hypothyroidism  . OSA treated with BiPAP  . Pulmonary embolism ([place]) 03/2013 . Thyroid disease   Hypothyroid . Type II diabetes mellitus ([place])  Past Surgical History: Past Surgical History: Procedure Laterality Date . ABDOMINAL HYSTERECTOMY  1970s . BREAST BIOPSY Left 2006 . BREAST LUMPECTOMY Left 2006 . BUNIONECTOMY WITH HAMMERTOE RECONSTRUCTION Left  .  ESOPHAGOGASTRODUODENOSCOPY N/A 05/29/2017  Procedure: ESOPHAGOGASTRODUODENOSCOPY (EGD);  Surgeon: Irene Shipper, MD;  Location: [facility];  Service: Endoscopy;  Laterality: N/A; . JOINT REPLACEMENT   . LAPAROSCOPIC CHOLECYSTECTOMY  ~ 2005 . TOTAL HIP ARTHROPLASTY Left 2010 . TOTAL KNEE ARTHROPLASTY Bilateral 2005-2006 HPI: 76 y.o.female with history of depression, DVT with pulmonary embolism, OSA, and diabetes mellitus type 2 presented on 11/21 with altered mentation, status elipticus.  MRI without obvious focal source. ETT 11/21; extubated 11/27, but with immediate stridor/vocal fold edema; unable to reintubate and required emergent trach.  Subjective: The patient was seen in radiology.  Assessment / Plan / Recommendation CHL IP CLINICAL IMPRESSIONS 06/17/2017 Clinical Impression MBS was completed using thin liquids, nectar thick liquids, honey thick liquids, pureed material and solids.  The patient presented with a mild pharyngeal dysphagia characterized by delayed initiation of the swallow which lead to flash penetration just prior to the swallow given thin and  nectar thick liquids.  This material was observed to completely clear the laryngeal vestibule.  Chin tuck was attempted and ineffective to prevent.  Prominent cricopharyngeus was noted that was not causing any functional issues.  Esophageal sweep did not reveal overt issues.  Recommend a regular diet with thin liquids.  The patient needs to be totally upright for all intake and should take one, small sip at a time.  NO straws and medications should be whole in pureed material.  ST will follow up for therapeutic diet tolerance and swallowing therapy.   SLP Visit Diagnosis Dysphagia, pharyngeal phase (R13.13) Attention and concentration deficit following -- Frontal lobe and executive function deficit following -- Impact on safety and function Mild aspiration risk   CHL IP TREATMENT RECOMMENDATION 06/17/2017 Treatment Recommendations Therapy as outlined in treatment plan below   Prognosis 06/17/2017 Prognosis for Safe Diet Advancement Good Barriers to Reach Goals -- Barriers/Prognosis Comment -- CHL IP DIET RECOMMENDATION 06/17/2017 SLP Diet Recommendations Regular solids;Thin liquid Liquid Administration via Cup;No straw Medication Administration Whole meds with puree Compensations Slow rate;Small sips/bites Postural Changes Seated upright at 90 degrees   CHL IP OTHER RECOMMENDATIONS 06/17/2017 Recommended Consults -- Oral Care Recommendations Oral care BID Other Recommendations --   CHL IP FOLLOW UP RECOMMENDATIONS 06/17/2017 Follow up Recommendations Inpatient Rehab   CHL IP FREQUENCY AND DURATION 06/17/2017 Speech Therapy Frequency (ACUTE ONLY) min 3x week Treatment Duration 2 weeks      CHL IP ORAL PHASE 06/17/2017 Oral Phase WFL Oral - Pudding Teaspoon -- Oral - Pudding Cup -- Oral - Honey Teaspoon -- Oral - Honey Cup -- Oral - Nectar Teaspoon -- Oral - Nectar Cup -- Oral - Nectar Straw -- Oral - Thin Teaspoon -- Oral - Thin Cup -- Oral - Thin Straw -- Oral - Puree -- Oral - Mech Soft -- Oral - Regular -- Oral -  Multi-Consistency -- Oral - Pill -- Oral Phase - Comment --  CHL IP PHARYNGEAL PHASE 06/17/2017 Pharyngeal Phase Impaired Pharyngeal- Pudding Teaspoon -- Pharyngeal -- Pharyngeal- Pudding Cup -- Pharyngeal -- Pharyngeal- Honey Teaspoon -- Pharyngeal -- Pharyngeal- Honey Cup Delayed swallow initiation-vallecula Pharyngeal -- Pharyngeal- Nectar Teaspoon -- Pharyngeal -- Pharyngeal- Nectar Cup Delayed swallow initiation-pyriform sinuses Pharyngeal -- Pharyngeal- Nectar Straw -- Pharyngeal -- Pharyngeal- Thin Teaspoon Delayed swallow initiation-pyriform sinuses;Penetration/Aspiration before swallow Pharyngeal Material enters airway, remains ABOVE vocal cords then ejected out Pharyngeal- Thin Cup Delayed swallow initiation-pyriform sinuses;Penetration/Aspiration before swallow Pharyngeal Material enters airway, remains ABOVE vocal cords then ejected out Pharyngeal- Thin Straw Delayed swallow initiation-pyriform sinuses;Penetration/Aspiration before swallow  Pharyngeal Material enters airway, remains ABOVE vocal cords then ejected out Pharyngeal- Puree -- Pharyngeal -- Pharyngeal- Mechanical Soft -- Pharyngeal -- Pharyngeal- Regular -- Pharyngeal -- Pharyngeal- Multi-consistency -- Pharyngeal -- Pharyngeal- Pill -- Pharyngeal -- Pharyngeal Comment --  Shelly Flatten, MA, CCC-SLP Acute Rehab SLP [phone] Lamar Sprinkles 06/17/2017, 12:35 PM                Scheduled Meds: . albuterol  2.5 mg Nebulization Q6H  . chlorhexidine  15 mL Mouth Rinse BID  . insulin aspart  0-20 Units Subcutaneous Q4H  . insulin aspart  4 Units Subcutaneous Q4H  . levothyroxine  100 mcg Oral QAC breakfast  . mouth rinse  15 mL Mouth Rinse q12n4p  . metoprolol succinate  100 mg Oral Daily  . pantoprazole  40 mg Oral Daily  . spironolactone  50 mg Oral Daily   Continuous Infusions: . heparin 1,150 Units/hr (06/18/17 1033)  . levETIRAcetam Stopped (06/18/17 0330)  . piperacillin-tazobactam (ZOSYN)  IV Stopped (06/18/17 0845)      LOS: 12 days   Time Spent in minutes   30 minutes  Demarlo Riojas D.O. on 06/18/2017 at 11:49 AM  Between 7am to 7pm - Pager - [phone]  After 7pm go to [email or web address] - password [email or web address]  And look for the night coverage person covering for me after hours  [facility] Office  [phone]

## 2017-06-18 NOTE — Progress Notes (Signed)
Physical Therapy Treatment Patient Details Name: Amanda Short MRN: [id number] DOB: [birth year] Today's Date: 06/18/2017    History of Present Illness Patient is a 76 yo female admitted 06/06/17 with AMS and seizures.  Patient was intubated to protect airway.  Failed extubation, patient had emergent tracheotomy.   PMH:  anemia, chronic resp failure on 2.5 L O2 at home, depression, anxiety, DM, HTN    PT Comments    Pt in recliner upon arrival. BP 158/91 seated pretreatment. Pt required multiple tactile and verbal cues for hand placement on chair during all transfers. Pt stated she had her hands on the chair when she was holding onto walker. Min assist to power up from chair. VCs to place hands on RW once standing. Pt able to stand for a short time; attempted standing marching and fatigued quickly. Pt impulsive and would try to sit with no verbal warning. Sit to stand x2. Seated exercises with cues for speed. Unable to perform without handholds on chair due to sitting balance. Pt would benefit from CIR to improve balance and strength to improve safe functional independence. BP 160/99 seated after session.  Follow Up Recommendations  CIR;Supervision/Assistance - 24 hour     Equipment Recommendations  None recommended by PT    Recommendations for Other Services Rehab consult     Precautions / Restrictions Precautions Precautions: Fall Precaution Comments: trach Restrictions Weight Bearing Restrictions: No    Mobility  Bed Mobility               General bed mobility comments: Seated in recliner upon entry.  Transfers Overall transfer level: Needs assistance Equipment used: Rolling walker (2 wheeled) Transfers: Sit to/from Stand Sit to Stand: Min assist;+2 physical assistance;+2 safety/equipment         General transfer comment: Pt required multiple tactile and verbal cues to push from arms of recliner. Pt did not seem to understand request; possibly due to processing.  Min assist for power up and balance. Pt impulsive; as soon as felt tired tried to sit with no verbal warning.  Ambulation/Gait                 Stairs            Wheelchair Mobility    Modified Rankin (Stroke Patients Only)       Balance Overall balance assessment: Needs assistance;History of Falls Sitting-balance support: Feet supported;No upper extremity supported Sitting balance-Leahy Scale: Fair Sitting balance - Comments: Pt able to sit without support of chair back. When attempted to perform seating exercises without support pt began to lean posteriorly and required handholds on chair to maintain balance. Postural control: Posterior lean Standing balance support: Bilateral upper extremity supported Standing balance-Leahy Scale: Poor Standing balance comment: requires UE support                            Cognition Arousal/Alertness: Awake/alert Behavior During Therapy: WFL for tasks assessed/performed;Flat affect Overall Cognitive Status: Difficult to assess Area of Impairment: Attention;Memory;Following commands;Safety/judgement;Problem solving                   Current Attention Level: Selective Memory: Decreased short-term memory Following Commands: Follows one step commands inconsistently Safety/Judgement: Decreased awareness of safety   Problem Solving: Slow processing General Comments: Difficulty with processing simple commands.       Exercises General Exercises - Lower Extremity Long Arc Quad: AROM;Both;10 reps;Seated Hip Flexion/Marching: AROM;Both;20 reps;Seated Toe Raises: AROM;Both;10 reps;Seated Heel  Raises: AROM;Both;10 reps;Seated    General Comments General comments (skin integrity, edema, etc.): When applied PMSV initially pt with O2 desaturation (10L O2 45% on trach collar) to 84% and able to rebound to 96% with encouragement of breathing techniques.       Pertinent Vitals/Pain Pain Assessment: Faces Faces Pain  Scale: Hurts little more Pain Intervention(s): Monitored during session;Repositioned    Home Living Family/patient expects to be discharged to:: Private residence Living Arrangements: Spouse/significant other Available Help at Discharge: Family;Available 24 hours/day Type of Home: House Home Access: Level entry   Home Layout: One level Home Equipment: Walker - 2 wheels;Shower seat;Cane - single point;Bedside commode      Prior Function Level of Independence: Independent          PT Goals (current goals can now be found in the care plan section) Acute Rehab PT Goals Patient Stated Goal: none discussed PT Goal Formulation: With patient Time For Goal Achievement: 06/30/17 Potential to Achieve Goals: Good Progress towards PT goals: Progressing toward goals    Frequency    Min 3X/week      PT Plan Discharge plan needs to be updated    Co-evaluation PT/OT/SLP Co-Evaluation/Treatment: Yes Reason for Co-Treatment: For patient/therapist safety PT goals addressed during session: Mobility/safety with mobility;Strengthening/ROM;Balance;Proper use of DME OT goals addressed during session: ADL's and self-care      AM-PAC PT "6 Clicks" Daily Activity  Outcome Measure  Difficulty turning over in bed (including adjusting bedclothes, sheets and blankets)?: A Little Difficulty moving from lying on back to sitting on the side of the bed? : Unable Difficulty sitting down on and standing up from a chair with arms (e.g., wheelchair, bedside commode, etc,.)?: Unable Help needed moving to and from a bed to chair (including a wheelchair)?: A Lot Help needed walking in hospital room?: A Lot Help needed climbing 3-5 steps with a railing? : Total 6 Click Score: 10    End of Session Equipment Utilized During Treatment: Oxygen;Gait belt Activity Tolerance: Patient limited by fatigue Patient left: in chair;with call bell/phone within reach;with family/visitor present Nurse Communication:  Mobility status PT Visit Diagnosis: Unsteadiness on feet (R26.81);Other abnormalities of gait and mobility (R26.89);Muscle weakness (generalized) (M62.81)     Time: 8242-9980 PT Time Calculation (min) (ACUTE ONLY): 35 min  Charges:  $Therapeutic Activity: 8-22 mins                    G Codes:       Janna Arch, SPTA   Janna Arch 06/18/2017, 1:01 PM

## 2017-06-18 NOTE — Evaluation (Addendum)
Occupational Therapy Evaluation Patient Details Name: Amanda Short MRN: [id number] DOB: 01-29-1941 Today's Date: 06/18/2017    History of Present Illness Patient is a 76 yo female admitted 06/06/17 with AMS and seizures.  Patient was intubated to protect airway.  Failed extubation, patient had emergent tracheotomy.   PMH:  anemia, chronic resp failure on 2.5 L O2 at home, depression, anxiety, DM, HTN   Clinical Impression   PTA, pt reports independence with ADL and functional mobility. She currently requires min assist +2 for sit<>stand transfers in preparation for toilet transfers. Limited in mobility due to impulsivity and weakness this session. She additionally requires mod assist for LB ADL and min assist for UB ADL. Pt presents with decreased problem solving skills, decreased processing skills, generalized weakness, and decreased activity tolerance for ADL. She tolerated wear of PMSV during session well despite initial desaturation to 84% on 10L at 45% on trach collar with rebound to 96% while seated in chair. SpO2 >90% throughout mobility and ADL. Feel pt would best benefit from CIR level therapies post-acute D/C to maximize return to independence prior to D/C home with assistance from husband. OT will continue to follow while admitted.     Follow Up Recommendations  CIR;Supervision/Assistance - 24 hour    Equipment Recommendations  Other (comment)(defer to next venue of care)    Recommendations for Other Services       Precautions / Restrictions Precautions Precautions: Fall Precaution Comments: trach Restrictions Weight Bearing Restrictions: No      Mobility Bed Mobility               General bed mobility comments: Seated in recliner upon entry.  Transfers Overall transfer level: Needs assistance Equipment used: Rolling walker (2 wheeled) Transfers: Sit to/from Stand Sit to Stand: Min assist;+2 physical assistance;+2 safety/equipment         General  transfer comment: Pt required multiple tactile and verbal cues to push from arms of recliner. Pt did not seem to understand request; possibly due to processing. Min assist for power up and balance. Pt impulsive; as soon as felt tired tried to sit with no verbal warning.    Balance Overall balance assessment: Needs assistance;History of Falls Sitting-balance support: Feet supported;No upper extremity supported Sitting balance-Leahy Scale: Fair Sitting balance - Comments: Pt able to sit without support of chair back. When attempted to perform seating exercises without support pt began to lean posteriorly and required handholds on chair to maintain balance. Postural control: Posterior lean Standing balance support: Bilateral upper extremity supported Standing balance-Leahy Scale: Poor Standing balance comment: requires UE support                           ADL either performed or assessed with clinical judgement   ADL Overall ADL's : Needs assistance/impaired Eating/Feeding: Supervision/ safety;Sitting   Grooming: Supervision/safety;Sitting   Upper Body Bathing: Minimal assistance;Sitting   Lower Body Bathing: Moderate assistance;Sit to/from stand   Upper Body Dressing : Minimal assistance;Sitting   Lower Body Dressing: Moderate assistance;Sit to/from stand   Toilet Transfer: Minimal assistance;+2 for physical assistance;RW Toilet Transfer Details (indicate cue type and reason): Min assist for sit<>stand with min assist and march in place. Sitting impulsively and unable to progress beyond marching in place.  Toileting- Clothing Manipulation and Hygiene: Maximal assistance;Sit to/from stand       Functional mobility during ADLs: Minimal assistance;+2 for physical assistance;Rolling walker(only able to march in place. ) General ADL Comments: Pt  with very poor activity tolerance for ADL. She requires cues for processing and to follow commands well.      Vision Patient  Visual Report: No change from baseline Additional Comments: Will further assess     Perception     Praxis      Pertinent Vitals/Pain Pain Assessment: Faces Faces Pain Scale: Hurts little more Pain Intervention(s): Monitored during session;Repositioned     Hand Dominance     Extremity/Trunk Assessment Upper Extremity Assessment Upper Extremity Assessment: Generalized weakness   Lower Extremity Assessment Lower Extremity Assessment: Generalized weakness       Communication Communication Communication: Tracheostomy;Passy-Muir valve   Cognition Arousal/Alertness: Awake/alert Behavior During Therapy: WFL for tasks assessed/performed;Flat affect Overall Cognitive Status: Difficult to assess Area of Impairment: Attention;Memory;Following commands;Safety/judgement;Problem solving                   Current Attention Level: Selective Memory: Decreased short-term memory Following Commands: Follows one step commands inconsistently Safety/Judgement: Decreased awareness of safety   Problem Solving: Slow processing General Comments: Difficulty with processing simple commands.    General Comments  When applied PMSV initially pt with O2 desaturation (10L O2 45% on trach collar) to 84% and able to rebound to 96% with encouragement of breathing techniques.     Exercises General Exercises - Lower Extremity Long Arc Quad: AROM;Both;10 reps;Seated Hip Flexion/Marching: AROM;Both;20 reps;Seated Toe Raises: AROM;Both;10 reps;Seated Heel Raises: AROM;Both;10 reps;Seated   Shoulder Instructions      Home Living Family/patient expects to be discharged to:: Private residence Living Arrangements: Spouse/significant other Available Help at Discharge: Family;Available 24 hours/day Type of Home: House Home Access: Level entry     Home Layout: One level     Bathroom Shower/Tub: Teacher, early years/pre: Standard     Home Equipment: Environmental consultant - 2 wheels;Shower seat;Cane  - single point;Bedside commode          Prior Functioning/Environment Level of Independence: Independent                 OT Problem List: Decreased strength;Decreased activity tolerance;Impaired balance (sitting and/or standing);Decreased safety awareness;Decreased knowledge of use of DME or AE;Decreased knowledge of precautions;Decreased cognition;Pain      OT Treatment/Interventions: Self-care/ADL training;Therapeutic exercise;Energy conservation;DME and/or AE instruction;Therapeutic activities;Cognitive remediation/compensation;Patient/family education;Balance training    OT Goals(Current goals can be found in the care plan section) Acute Rehab OT Goals Patient Stated Goal: none discussed OT Goal Formulation: With patient Time For Goal Achievement: 07/02/17 Potential to Achieve Goals: Good ADL Goals Pt Will Perform Grooming: with min assist;standing Pt Will Perform Lower Body Dressing: with min guard assist;sit to/from stand Pt Will Transfer to Toilet: with min assist;ambulating;bedside commode(BSC over toilet) Pt Will Perform Toileting - Clothing Manipulation and hygiene: with min assist;sit to/from stand Additional ADL Goal #1: Pt will follow 3/4 multi-step commands with no more than 1 VC during grooming tasks.  OT Frequency: Min 3X/week   Barriers to D/C:            Co-evaluation PT/OT/SLP Co-Evaluation/Treatment: Yes Reason for Co-Treatment: For patient/therapist safety PT goals addressed during session: Mobility/safety with mobility;Strengthening/ROM;Balance;Proper use of DME OT goals addressed during session: ADL's and self-care      AM-PAC PT "6 Clicks" Daily Activity     Outcome Measure Help from another person eating meals?: A Little Help from another person taking care of personal grooming?: A Little Help from another person toileting, which includes using toliet, bedpan, or urinal?: A Lot Help from another person bathing (  including washing, rinsing,  drying)?: A Lot Help from another person to put on and taking off regular upper body clothing?: A Little Help from another person to put on and taking off regular lower body clothing?: A Lot 6 Click Score: 15   End of Session Equipment Utilized During Treatment: Gait belt;Rolling walker;Oxygen Nurse Communication: Mobility status  Activity Tolerance: Patient tolerated treatment well Patient left: in chair;with call bell/phone within reach;with family/visitor present  OT Visit Diagnosis: Muscle weakness (generalized) (M62.81);Other symptoms and signs involving cognitive function;Unsteadiness on feet (R26.81)                Time: 1049-1130 OT Time Calculation (min): 41 min Charges:  OT General Charges $OT Visit: 1 Visit OT Evaluation $OT Eval Moderate Complexity: 1 Mod G-Codes:     Norman Herrlich, MS OTR/L  Pager: Douglas A Jhoel Stieg 06/18/2017, 12:04 PM

## 2017-06-18 NOTE — Progress Notes (Signed)
ANTICOAGULATION CONSULT NOTE - Follow Up Consult  Pharmacy Consult for Heparin while rivaroxaban on hold Indication: Hx of PE  Allergies  Allergen Reactions  . Allopurinol Nausea And Vomiting   Patient Measurements: Height: _0  (154.9 cm) Weight: 226 lb 6.6 oz (102.7 kg) IBW/kg (Calculated) : 47.8 Heparin Dosing Weight: 67.7kg  Vital Signs: Temp: 98.7 F (37.1 C) (12/03 0743) Temp Source: Oral (12/03 0743) BP: 153/86 (12/03 0743) Pulse Rate: 75 (12/03 0818)  Labs: Recent Labs    06/16/17 0640 06/16/17 1049 06/17/17 0353 06/18/17 0738  HGB  --  9.0* 9.8*  --   HCT  --  29.5* 32.1*  --   PLT  --  367 418*  --   HEPARINUNFRC 0.55  --  0.70 0.85*  CREATININE  --  0.69 0.75  --     Estimated Creatinine Clearance: 65.9 mL/min (by C-G formula based on SCr of 0.75 mg/dL).   Medications:  Scheduled:  . albuterol  2.5 mg Nebulization Q6H  . chlorhexidine  15 mL Mouth Rinse BID  . insulin aspart  0-20 Units Subcutaneous Q4H  . insulin aspart  4 Units Subcutaneous Q4H  . levothyroxine  100 mcg Oral QAC breakfast  . mouth rinse  15 mL Mouth Rinse q12n4p  . metoprolol succinate  100 mg Oral Daily  . pantoprazole  40 mg Oral Daily  . spironolactone  50 mg Oral Daily   Assessment: AH is a 75 YO F with a PMH of PE and DVT (2014). Last dose of Xarelto on 11/20. Admitted due to altered mental status and seizures en route to the hospital.  Heparin level now supratherapeutic and trending up, now 0.85. Heparin running in central line, heparin levels drawn peripherally confirmed by RN. No bleeding or infusion issues noted per discussion with RN, CBC stable.  Goal of Therapy:  Heparin level 0.3-0.7 units/ml Monitor platelets by anticoagulation protocol: Yes   Plan:  Decrease heparin gtt to 1150 units/hr  Daily CBC and heparin level Monitor for signs/symptoms of bleeding  Elicia Lamp, PharmD, BCPS Clinical Pharmacist Rx Phone # for today: [phone] After 3:30PM, please  call Main Rx: #[id number] 06/18/2017 8:46 AM

## 2017-06-19 ENCOUNTER — Inpatient Hospital Stay (HOSPITAL_COMMUNITY): Payer: Medicare Other

## 2017-06-19 LAB — BASIC METABOLIC PANEL
ANION GAP: 12 (ref 5–15)
BUN: 5 mg/dL — AB (ref 6–20)
CHLORIDE: 93 mmol/L — AB (ref 101–111)
CO2: 36 mmol/L — ABNORMAL HIGH (ref 22–32)
Calcium: 8.4 mg/dL — ABNORMAL LOW (ref 8.9–10.3)
Creatinine, Ser: 0.81 mg/dL (ref 0.44–1.00)
GFR calc Af Amer: >60 mL/min (ref >60)
Glucose, Bld: 149 mg/dL — ABNORMAL HIGH (ref 65–99)
POTASSIUM: 3.4 mmol/L — AB (ref 3.5–5.1)
SODIUM: 141 mmol/L (ref 135–145)

## 2017-06-19 LAB — GLUCOSE, CAPILLARY
GLUCOSE-CAPILLARY: 155 mg/dL — AB (ref 65–99)
Glucose-Capillary: 157 mg/dL — ABNORMAL HIGH (ref 65–99)
Glucose-Capillary: 144 mg/dL — ABNORMAL HIGH (ref 65–99)
Glucose-Capillary: 206 mg/dL — ABNORMAL HIGH (ref 65–99)

## 2017-06-19 LAB — CBC
HCT: 32.1 % — ABNORMAL LOW (ref 36.0–46.0)
HEMOGLOBIN: 9.8 g/dL — AB (ref 12.0–15.0)
MCH: 30.5 pg (ref 26.0–34.0)
MCHC: 30.5 g/dL (ref 30.0–36.0)
MCV: 100 fL (ref 78.0–100.0)
Platelets: 429 10*3/uL — ABNORMAL HIGH (ref 150–400)
RBC: 3.21 MIL/uL — AB (ref 3.87–5.11)
RDW: 13.9 % (ref 11.5–15.5)
WBC: 12.1 10*3/uL — AB (ref 4.0–10.5)

## 2017-06-19 LAB — MAGNESIUM: MAGNESIUM: 1.3 mg/dL — AB (ref 1.7–2.4)

## 2017-06-19 MED ORDER — MAGNESIUM SULFATE 4 GM/100ML IV SOLN
4.0000 g | Freq: Once | INTRAVENOUS | Status: AC
  Administered 2017-06-19: 4 g via INTRAVENOUS
  Filled 2017-06-19: qty 100

## 2017-06-19 MED ORDER — METOCLOPRAMIDE HCL 5 MG/ML IJ SOLN
10.0000 mg | Freq: Once | INTRAMUSCULAR | Status: AC
  Administered 2017-06-19: 10 mg via INTRAVENOUS
  Filled 2017-06-19: qty 2

## 2017-06-19 NOTE — Progress Notes (Signed)
Shift event: Notified by RN at approx 2350 that pt found down in room on her back s/p unwitnessed fall. Pt had no known LOC and there were no visible injuries. Pt reported to RN she "slipped out of bed". At bedside pt noted A, A & O x 2. MAE x 4. There are no visible injuries. Grips equal, No bony spinal TTP. Pt states " I am alright" but admits to a (R) sided h/a that she did not have before the fall and is unable to recall if she hit her head or not during her fall. VSS. Heparin drip d/c'd at 1033 06/18/2017 and pt rec/d first dose of Xarelto at 1406 on 06/18/2017. Pt had requested to keep slip proof socks off while in bed and bed alarm had been turned off for a bath.  Assessment/Plan: 1. Unwitnessed fall: Exam is unremarkable w/o focal findings. Given h/a s/p unwitnessed fall and recent Heparin drip/Xarelto will obtain ct head and cervical spine w/o. Bed alarm is back on and pt now wearing slip proof socks. F/u ct scans. Will continue to monitor closely on SDU.   Jeryl Columbia, NP-C [facility] Pager [phone]

## 2017-06-19 NOTE — Progress Notes (Signed)
PROGRESS NOTE    Amanda Short  KZS:[id number] DOB: 1940/12/13 DOA: 06/06/2017 PCP: System, Pcp Not In   Chief Complaint  Patient presents with  . Altered Mental Status    Brief Narrative:  HPI on 06/06/2017 by Dr. Jennet Maduro (PCCM) Pt is encephelopathic; therefore, this HPI is obtained from chart review. Amanda Short is a 76 y.o. female with PMH as outlined below.  She was brought to [facility] ED 11/21 with AMS.  She was last seen normal 4 - 5 hours prior.  Initially called as code stroke. En route to ED, had seizure for which she was given 2.86m of versed.  In ED, had recurrent CT after returning from CT scan.  Received 672mativan and later had decrease in mental status / post ictal state where she was not protecting airway. She was subsequently intubated and PCCM was asked to admit to the ICU.  Neurology consulted by EDP. No known hx of seizures.  Interim history Patient received trach and transferred to stepdown. TRH assumed care on 06/15/2017. Patient improving. Possibly CIR candidate.  Assessment & Plan   Status epilepticus, new seizure disorder -patient presented with seizure and was intubated for airway protection -Neurology consulted and appreciated, suspected low magnesium at to be a possible precipitant  -She was placed on Keppra [id number] -EEG as listed below -Has not had further episodes since admission -will likely need neurology outpatient follow up  Acute on chronic hypoxic respiratory fialure -suspected retropharyngeal edema/vocal cord edema s/p emergent trach on 06/12/17 after failing extubation attempt -Currently not on vent -PCCM continues to follow, plan for change to downsize to 4 cuffless trach this week -Speech therapy consulted to initiate passy muir trials -Per husband, patient uses 2.5 L of oxygen at home  Fall -patient fell yesterday evening/last night -CT head/cervical spine: No acute cranial process. Moderate cerebral atrophy with chronic  small vessel ischemic disease. No acute rheumatic injury within the cervical spine. 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 -Patient currently stable, will continue to monitor closely  Chronic Anemia -Possibly gastritis versus GI bleed -Hemoglobin dropped to 7.2, received no blood transfusions -last hemoglobin 9.2 (baseline 10-11) -Patient had a fairly traumatic tracheostomy placement with some blood loss and has been on heparin -Currently no evidence of bleeding -Continue to monitor CBC  Right lower lobe atelecatsis vs aspiration pneumonia -was placed on vancomycin (discontinued on 11/30) and Zosyn (discontinued 12/3)  Hypomagnesemia -Magnesium 2 after replacement -pending labs today  Hypocalcemia -corrected calcium WNL -continue to monitor   Hypokalemia -Pending labs today  Acute encephalopathy -Resolved -possibly has some sundowning in the evening, may be secondary to medications vs hospital stay  Depression/anxiety -Continue cymbalta and ativan  OSA -Continue trach  History of DVT/PE -Currently on heparin -Restarted on xarelto  Protein calorie malnutrition -was on tube feeds, however, patient pulled out her NG tube -Speech therapy consulted, now on dysphagia 3 diet  Diabetes mellitus, type II -Continue insulin sliding scale with CBG monitoring   Essential hypertension -Continue metoprolol, spironolactone  Hypothyroidism -Continue Synthroid  Physical deconditioning -PT/OT consulted, recommended CIR -CIR consulted and patient potential candidate  DVT Prophylaxis  heparin  Code Status: Full  Family Communication: Husband at bedside  Disposition Plan: Admitted. dispo pending- CIR consulted  Consultants PCCM Neurology  Procedures  CT HEAD W/O 11/21:  No acute cortically based infarct or intracranial hemorrhage identified. ASPECTS is 10.  Increased chronic nonspecific cerebral volume loss since 2014. Stable mild to moderate  nonspecific white matter disease.  PORT CXR 11/22:  Previously reviewed by me. Persistent silhouetting left hemidiaphragm suggestive of opacification versus consolidation. Endotracheal tube in good position. Enteric feeding tube coursing below diaphragm. EEG 11/23: This EEG is indicative of a severe diffuse encephalopathy, non-specific as to etiology. Sedating medication effect cannot be excluded. No epileptiform discharges, seizures, focal or lateralizing signs are seen.Compared to previous epoch, there is minimal improvement in the degree of encephalopathy. 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 Sedating medication effectis strongly suspected as the main driver. No epileptiform discharges, seizures, focal or lateralizing signs are seen. MRI BRAIN W/O 11/24: IMPRESSION: 1. No acute abnormality. 2. Chronic ischemic microangiopathy. 3. Advanced cerebral volume loss that is asymmetrically worse on the left to a mild degree.  Antibiotics   Anti-infectives (From admission, onward)   Start     Dose/Rate Route Frequency Ordered Stop   06/13/17 0100  vancomycin (VANCOCIN) IVPB 750 mg/150 ml premix  Status:  Discontinued     750 mg 150 mL/hr over 60 Minutes Intravenous Every 12 hours 06/12/17 1335 06/15/17 1022   06/12/17 1200  piperacillin-tazobactam (ZOSYN) IVPB 3.375 g     3.375 g 12.5 mL/hr over 240 Minutes Intravenous Every 8 hours 06/12/17 1118     06/12/17 1130  vancomycin (VANCOCIN) 1,500 mg in sodium chloride 0.9 % 500 mL IVPB     1,500 mg 250 mL/hr over 120 Minutes Intravenous  Once 06/12/17 1118 06/12/17 1351      Subjective:   Amanda Short seen and examined today.  Feeling tired today. Has no complaints. Denies chest pain, shortness of breath, abdominal pain, N/V/D/C.   Objective:   Vitals:   06/19/17 0303 06/19/17 0711 06/19/17 0843 06/19/17 0847  BP: (!) 152/82 (!) 142/79      Pulse: 76 70  74  Resp: (!) 22 (!) 21  (!) 25  Temp: (!) 96.9 F (36.1 C) 98.2 F (36.8 C)    TempSrc: Axillary Axillary    SpO2: 92% 92% 100% 100%  Weight: 99.4 kg (219 lb 2.2 oz)     Height:        Intake/Output Summary (Last 24 hours) at 06/19/2017 1008 Last data filed at 06/19/2017 8366 Gross per 24 hour  Intake -  Output 1100 ml  Net -1100 ml   Filed Weights   06/17/17 0324 06/18/17 0326 06/19/17 0303  Weight: 101.1 kg (222 lb 14.2 oz) 102.7 kg (226 lb 6.6 oz) 99.4 kg (219 lb 2.2 oz)   Exam  General: Well developed, well nourished, NAD, appears stated age  32: NCAT, mucous membranes moist.   Neck: Trach  Cardiovascular: S1 S2 auscultated, no murmurs, RRR  Respiratory: Clear to auscultation bilaterally with equal chest rise  Abdomen: Soft, nontender, nondistended, + bowel sounds  Extremities: warm dry without cyanosis clubbing or edema  Neuro: AAOx3, nonfocal  Psych: Appropriate mood and affect  Data Reviewed: I have personally reviewed following labs and imaging studies  CBC: Recent Labs  Lab 06/13/17 0549 06/14/17 0432 06/15/17 0534 06/16/17 1049 06/17/17 0353 06/18/17 1209  WBC 7.8 10.8* 9.8 9.0 9.2 10.5  NEUTROABS 5.4  --   --   --   --   --   HGB 7.2* 8.4* 9.0* 9.0* 9.8* 9.2*  HCT 22.8* 26.3* 29.3* 29.5* 32.1* 29.7*  MCV 100.4* 98.5 100.0 101.0* 101.9* 100.0  PLT 211 267 344 367 418* 294*   Basic Metabolic Panel: Recent Labs  Lab 06/14/17 1959  06/15/17 0534 06/16/17 1049 06/17/17 0353 06/18/17 1209  NA 140 141 142 139 140  K 3.8 4.0 3.7 3.8 3.6  CL 103 103 101 100* 100*  CO2 30 31 32 27 33*  GLUCOSE 163* 189* 138* 136* 195*  BUN _0 CREATININE 0.58 0.65 0.69 0.75 0.75  CALCIUM 8.2* 8.5* 8.6* 8.4* 8.7*  MG  --   --  1.2* 2.0  --    GFR: Estimated Creatinine Clearance: 64.6 mL/min (by C-G formula based on SCr of 0.75 mg/dL). Liver Function Tests: Recent Labs  Lab 06/14/17 0432 06/16/17 1049  AST 20 12*  ALT 33 22   ALKPHOS 101 79  BILITOT 0.5 0.8  PROT 4.6* 4.9*  ALBUMIN 2.1* 2.1*   No results for input(s): LIPASE, AMYLASE in the last 168 hours. No results for input(s): AMMONIA in the last 168 hours. Coagulation Profile: No results for input(s): INR, PROTIME in the last 168 hours. Cardiac Enzymes: No results for input(s): CKTOTAL, CKMB, CKMBINDEX, TROPONINI in the last 168 hours. BNP (last 3 results) No results for input(s): PROBNP in the last 8760 hours. HbA1C: No results for input(s): HGBA1C in the last 72 hours. CBG: Recent Labs  Lab 06/18/17 1200 06/18/17 1236 06/18/17 1657 06/18/17 2111 06/19/17 0812  GLUCAP 189* 180* 187* 117* 157*   Lipid Profile: No results for input(s): CHOL, HDL, LDLCALC, TRIG, CHOLHDL, LDLDIRECT in the last 72 hours. Thyroid Function Tests: No results for input(s): TSH, T4TOTAL, FREET4, T3FREE, THYROIDAB in the last 72 hours. Anemia Panel: No results for input(s): VITAMINB12, FOLATE, FERRITIN, TIBC, IRON, RETICCTPCT in the last 72 hours. Urine analysis:    Component Value Date/Time   COLORURINE STRAW (A) 06/06/2017 1547   APPEARANCEUR CLEAR 06/06/2017 1547   LABSPEC 1.006 06/06/2017 1547   PHURINE 6.0 06/06/2017 1547   GLUCOSEU NEGATIVE 06/06/2017 1547   HGBUR NEGATIVE 06/06/2017 1547   BILIRUBINUR NEGATIVE 06/06/2017 1547   KETONESUR NEGATIVE 06/06/2017 1547   PROTEINUR 30 (A) 06/06/2017 1547   UROBILINOGEN 0.2 03/31/2013 2006   NITRITE NEGATIVE 06/06/2017 1547   LEUKOCYTESUR NEGATIVE 06/06/2017 1547   Sepsis Labs: _1 (procalcitonin:4,lacticidven:4)  ) Recent Results (from the past 240 hour(s))  Culture, respiratory (NON-Expectorated)     Status: None   Collection Time: 06/13/17 12:12 PM  Result Value Ref Range Status   Specimen Description TRACHEAL ASPIRATE  Final   Special Requests NONE  Final   Gram Stain   Final    RARE WBC PRESENT, PREDOMINANTLY PMN RARE GRAM POSITIVE COCCI IN PAIRS    Culture RARE STREPTOCOCCUS GROUP F   Final   Report Status 06/15/2017 FINAL  Final      Radiology Studies: Ct Head Wo Contrast  Result Date: 06/19/2017 CLINICAL DATA:  Initial evaluation for under witnessed fall. EXAM: CT HEAD WITHOUT CONTRAST CT CERVICAL SPINE WITHOUT CONTRAST TECHNIQUE: Multidetector CT imaging of the head and cervical spine was performed following the standard protocol without intravenous contrast. Multiplanar CT image reconstructions of the cervical spine were also generated. COMPARISON:  Prior CT from 06/06/2017. FINDINGS: CT HEAD FINDINGS Brain: Atrophy with chronic small vessel ischemic disease. No acute intracranial hemorrhage. No evidence for acute large vessel territory infarct. No mass lesion, midline shift or mass effect. No hydrocephalus. No extra-axial fluid collection. Vascular: No hyperdense vessel. Scattered vascular calcifications noted within the carotid siphons. Skull: Scalp soft tissues and calvarium within normal limits. Sinuses/Orbits: Globes normal soft tissues normal. Paranasal sinuses clear. Small bilateral mastoid effusions. CT CERVICAL SPINE  FINDINGS Alignment: Study degraded by motion artifact. Reversal of the normal cervical lordosis, apex at C5. Trace anterolisthesis of C7 on T1. Skull base and vertebrae: Skullbase intact. Normal C1-2 articulations are preserved in the dens is intact. Vertebral body heights maintained. No acute fracture. Soft tissues and spinal canal: Soft tissues of the neck demonstrate no acute abnormality. No prevertebral edema. Spinal canal within normal limits. Tracheostomy tube partially visualized. 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 Upper chest: Partially visualized upper chest without acute abnormality. Left IJ approach centra venous catheter partially visualized. Partially visualized lung apices grossly clear. IMPRESSION: 1. No acute intracranial process. 2. Moderate cerebral atrophy with chronic small vessel ischemic disease. 3. No  acute traumatic injury within cervical spine. 4. 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 Electronically Signed   By: Jeannine Boga M.D.   On: 06/19/2017 04:58   Ct Cervical Spine Wo Contrast  Result Date: 06/19/2017 CLINICAL DATA:  Initial evaluation for under witnessed fall. EXAM: CT HEAD WITHOUT CONTRAST CT CERVICAL SPINE WITHOUT CONTRAST TECHNIQUE: Multidetector CT imaging of the head and cervical spine was performed following the standard protocol without intravenous contrast. Multiplanar CT image reconstructions of the cervical spine were also generated. COMPARISON:  Prior CT from 06/06/2017. FINDINGS: CT HEAD FINDINGS Brain: Atrophy with chronic small vessel ischemic disease. No acute intracranial hemorrhage. No evidence for acute large vessel territory infarct. No mass lesion, midline shift or mass effect. No hydrocephalus. No extra-axial fluid collection. Vascular: No hyperdense vessel. Scattered vascular calcifications noted within the carotid siphons. Skull: Scalp soft tissues and calvarium within normal limits. Sinuses/Orbits: Globes normal soft tissues normal. Paranasal sinuses clear. Small bilateral mastoid effusions. CT CERVICAL SPINE FINDINGS Alignment: Study degraded by motion artifact. Reversal of the normal cervical lordosis, apex at C5. Trace anterolisthesis of C7 on T1. Skull base and vertebrae: Skullbase intact. Normal C1-2 articulations are preserved in the dens is intact. Vertebral body heights maintained. No acute fracture. Soft tissues and spinal canal: Soft tissues of the neck demonstrate no acute abnormality. No prevertebral edema. Spinal canal within normal limits. Tracheostomy tube partially visualized. 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 Upper chest: Partially visualized upper chest without acute abnormality. Left IJ approach centra venous catheter partially visualized. Partially visualized lung apices grossly clear.  IMPRESSION: 1. No acute intracranial process. 2. Moderate cerebral atrophy with chronic small vessel ischemic disease. 3. No acute traumatic injury within cervical spine. 4. 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 Electronically Signed   By: Jeannine Boga M.D.   On: 06/19/2017 04:58   Dg Swallowing Func-speech Pathology  Result Date: 06/17/2017 Objective Swallowing Evaluation: Type of Study: MBS-Modified Barium Swallow Study  Patient Details Name: Amanda Short MRN: [id number] Date of Birth: [birth year] Today's Date: 06/17/2017 Time: SLP Start Time (ACUTE ONLY): 1155 -SLP Stop Time (ACUTE ONLY): 1220 SLP Time Calculation (min) (ACUTE ONLY): 25 min Past Medical History: Past Medical History: Diagnosis Date . Arthritis   "back, hands" (07/22/2015) . Cancer of left breast ([place]) 2006  S/P lumpectomy . Complication of anesthesia   "brief breathing problem at surgery center in ~ 2005 when I had gallbladder OR" . Depression  . DVT (deep venous thrombosis) ([place]) 03/2013  LLE . GERD (gastroesophageal reflux disease)  . Hyperlipidemia  . Hypertension  . Hypothyroidism  . OSA treated with BiPAP  . Pulmonary embolism ([place]) 03/2013 . Thyroid disease   Hypothyroid . Type II diabetes mellitus ([place])  Past Surgical History: Past Surgical History: Procedure  Laterality Date . ABDOMINAL HYSTERECTOMY  1970s . BREAST BIOPSY Left 2006 . BREAST LUMPECTOMY Left 2006 . BUNIONECTOMY WITH HAMMERTOE RECONSTRUCTION Left  . ESOPHAGOGASTRODUODENOSCOPY N/A 05/29/2017  Procedure: ESOPHAGOGASTRODUODENOSCOPY (EGD);  Surgeon: Irene Shipper, MD;  Location: [facility];  Service: Endoscopy;  Laterality: N/A; . JOINT REPLACEMENT   . LAPAROSCOPIC CHOLECYSTECTOMY  ~ 2005 . TOTAL HIP ARTHROPLASTY Left 2010 . TOTAL KNEE ARTHROPLASTY Bilateral 2005-2006 HPI: 76 y.o.female with history of depression, DVT with pulmonary embolism, OSA, and diabetes mellitus type 2 presented on 11/21 with altered mentation, status elipticus.  MRI without  obvious focal source. ETT 11/21; extubated 11/27, but with immediate stridor/vocal fold edema; unable to reintubate and required emergent trach.  Subjective: The patient was seen in radiology.  Assessment / Plan / Recommendation CHL IP CLINICAL IMPRESSIONS 06/17/2017 Clinical Impression MBS was completed using thin liquids, nectar thick liquids, honey thick liquids, pureed material and solids.  The patient presented with a mild pharyngeal dysphagia characterized by delayed initiation of the swallow which lead to flash penetration just prior to the swallow given thin and nectar thick liquids.  This material was observed to completely clear the laryngeal vestibule.  Chin tuck was attempted and ineffective to prevent.  Prominent cricopharyngeus was noted that was not causing any functional issues.  Esophageal sweep did not reveal overt issues.  Recommend a regular diet with thin liquids.  The patient needs to be totally upright for all intake and should take one, small sip at a time.  NO straws and medications should be whole in pureed material.  ST will follow up for therapeutic diet tolerance and swallowing therapy.   SLP Visit Diagnosis Dysphagia, pharyngeal phase (R13.13) Attention and concentration deficit following -- Frontal lobe and executive function deficit following -- Impact on safety and function Mild aspiration risk   CHL IP TREATMENT RECOMMENDATION 06/17/2017 Treatment Recommendations Therapy as outlined in treatment plan below   Prognosis 06/17/2017 Prognosis for Safe Diet Advancement Good Barriers to Reach Goals -- Barriers/Prognosis Comment -- CHL IP DIET RECOMMENDATION 06/17/2017 SLP Diet Recommendations Regular solids;Thin liquid Liquid Administration via Cup;No straw Medication Administration Whole meds with puree Compensations Slow rate;Small sips/bites Postural Changes Seated upright at 90 degrees   CHL IP OTHER RECOMMENDATIONS 06/17/2017 Recommended Consults -- Oral Care Recommendations Oral care BID  Other Recommendations --   CHL IP FOLLOW UP RECOMMENDATIONS 06/17/2017 Follow up Recommendations Inpatient Rehab   CHL IP FREQUENCY AND DURATION 06/17/2017 Speech Therapy Frequency (ACUTE ONLY) min 3x week Treatment Duration 2 weeks      CHL IP ORAL PHASE 06/17/2017 Oral Phase WFL Oral - Pudding Teaspoon -- Oral - Pudding Cup -- Oral - Honey Teaspoon -- Oral - Honey Cup -- Oral - Nectar Teaspoon -- Oral - Nectar Cup -- Oral - Nectar Straw -- Oral - Thin Teaspoon -- Oral - Thin Cup -- Oral - Thin Straw -- Oral - Puree -- Oral - Mech Soft -- Oral - Regular -- Oral - Multi-Consistency -- Oral - Pill -- Oral Phase - Comment --  CHL IP PHARYNGEAL PHASE 06/17/2017 Pharyngeal Phase Impaired Pharyngeal- Pudding Teaspoon -- Pharyngeal -- Pharyngeal- Pudding Cup -- Pharyngeal -- Pharyngeal- Honey Teaspoon -- Pharyngeal -- Pharyngeal- Honey Cup Delayed swallow initiation-vallecula Pharyngeal -- Pharyngeal- Nectar Teaspoon -- Pharyngeal -- Pharyngeal- Nectar Cup Delayed swallow initiation-pyriform sinuses Pharyngeal -- Pharyngeal- Nectar Straw -- Pharyngeal -- Pharyngeal- Thin Teaspoon Delayed swallow initiation-pyriform sinuses;Penetration/Aspiration before swallow Pharyngeal Material enters airway, remains ABOVE vocal cords then ejected out Pharyngeal- Thin Cup Delayed  swallow initiation-pyriform sinuses;Penetration/Aspiration before swallow Pharyngeal Material enters airway, remains ABOVE vocal cords then ejected out Pharyngeal- Thin Straw Delayed swallow initiation-pyriform sinuses;Penetration/Aspiration before swallow Pharyngeal Material enters airway, remains ABOVE vocal cords then ejected out Pharyngeal- Puree -- Pharyngeal -- Pharyngeal- Mechanical Soft -- Pharyngeal -- Pharyngeal- Regular -- Pharyngeal -- Pharyngeal- Multi-consistency -- Pharyngeal -- Pharyngeal- Pill -- Pharyngeal -- Pharyngeal Comment --  Shelly Flatten, MA, CCC-SLP Acute Rehab SLP [phone] Lamar Sprinkles 06/17/2017, 12:35 PM                 Scheduled Meds: . albuterol  2.5 mg Nebulization Q6H  . chlorhexidine  15 mL Mouth Rinse BID  . DULoxetine  60 mg Oral Daily  . furosemide  40 mg Intravenous Q12H  . insulin aspart  0-9 Units Subcutaneous TID WC  . levothyroxine  100 mcg Oral QAC breakfast  . mouth rinse  15 mL Mouth Rinse q12n4p  . metoprolol succinate  100 mg Oral Daily  . pantoprazole  40 mg Oral Daily  . rivaroxaban  20 mg Oral Q supper  . spironolactone  50 mg Oral Daily   Continuous Infusions: . levETIRAcetam Stopped (06/19/17 0233)  . piperacillin-tazobactam (ZOSYN)  IV 3.375 g (06/19/17 0307)     LOS: 13 days   Time Spent in minutes   30 minutes  Mason Dibiasio D.O. on 06/19/2017 at 10:08 AM  Between 7am to 7pm - Pager - [phone]  After 7pm go to [email or web address] - password [email or web address]  And look for the night coverage person covering for me after hours  [facility] Office  [phone]

## 2017-06-19 NOTE — Progress Notes (Signed)
Rehab admissions - I met with patient and her husband.  They would like inpatient rehab admission.  Patient with trach collar and 02 at 60%.  Noted patient desaturated with activity initially, but then tolerated therapy with saturations at 90% per therapy documentation.  I will discuss 02 requirements with my rehab team to see if we can accommodate patient needs during a potential rehab admission.  Call me for questions.  #[phone]

## 2017-06-19 NOTE — Progress Notes (Signed)
Telemetry called and said patient was off the leads. Went to patient room and found patient lying in the floor on her back. Lauren, CNA was already in the room and assisting patient. Patient able to say her name, where she was and why she is here. Patient pupils are equal and reactive. VS : 159/94 HR is 75, MAP is 111, 02 sat is 95% with trach collar, and temp is 97.7. Dr Hilbert Bible is notified and patient husband is notified by phone. He is updated on her condition and new CT order. Mr Speece verb understanding and expressed appreciation that Dr Hilbert Bible came to see patient and was ordering the CT.

## 2017-06-19 NOTE — Progress Notes (Signed)
Patient CT delayed d/t nausea and vomiting. Dr Hilbert Bible is aware and awaiting orders.

## 2017-06-19 NOTE — Care Management Note (Signed)
Case Management Note  Patient Details  Name: Amanda Short MRN: [id number] Date of Birth: 1941/05/18  Subjective/Objective:     Pt admitted with seizures               Action/Plan:  PTA independent from home with husband.  Pt had to be emergently trached 11/27 post failing extubation.  Pt is already on TC 11/29 and per PCCM will likely wean very quickly.  Tentative discharge plan will be SNF - CM will continue to follow for discharge needs   Expected Discharge Date:                  Expected Discharge Plan:     In-House Referral:     Discharge planning Services     Post Acute Care Choice:    Choice offered to:     DME Arranged:    DME Agency:     HH Arranged:    HH Agency:     Status of Service:     If discussed at [employer] of Stay Meetings, dates discussed:    Additional Comments: 06/19/2017 . Discussed in LOS 12/4 - pt remains appropriate for continued stay.  Pt remains on TC however requiring 60% FIO2 (neither CIR nor SNF can accept at current need).  Pt remains inappropriate for LTACH at this time per physician advisor - however CM will continue to follow   06/15/17 Pt moved to [place].  Pt is on TC 40%, PMV not yet utilized as of yet.  Pt remains on IV hepairn.  Per bedside nurse pt is 2+ assist.  PT/OT eval ordered Maryclare Labrador, RN 06/19/2017, 11:08 AM

## 2017-06-20 LAB — BASIC METABOLIC PANEL
ANION GAP: 11 (ref 5–15)
CO2: 38 mmol/L — ABNORMAL HIGH (ref 22–32)
Calcium: 8.2 mg/dL — ABNORMAL LOW (ref 8.9–10.3)
Chloride: 89 mmol/L — ABNORMAL LOW (ref 101–111)
Creatinine, Ser: 0.7 mg/dL (ref 0.44–1.00)
Glucose, Bld: 148 mg/dL — ABNORMAL HIGH (ref 65–99)
POTASSIUM: 3 mmol/L — AB (ref 3.5–5.1)
SODIUM: 138 mmol/L (ref 135–145)

## 2017-06-20 LAB — CBC
HCT: 30.2 % — ABNORMAL LOW (ref 36.0–46.0)
Hemoglobin: 9.4 g/dL — ABNORMAL LOW (ref 12.0–15.0)
MCH: 31.2 pg (ref 26.0–34.0)
MCHC: 31.1 g/dL (ref 30.0–36.0)
MCV: 100.3 fL — ABNORMAL HIGH (ref 78.0–100.0)
PLATELETS: 362 10*3/uL (ref 150–400)
RBC: 3.01 MIL/uL — AB (ref 3.87–5.11)
RDW: 14.2 % (ref 11.5–15.5)
WBC: 8.6 10*3/uL (ref 4.0–10.5)

## 2017-06-20 LAB — GLUCOSE, CAPILLARY
GLUCOSE-CAPILLARY: 182 mg/dL — AB (ref 65–99)
GLUCOSE-CAPILLARY: 159 mg/dL — AB (ref 65–99)
Glucose-Capillary: 130 mg/dL — ABNORMAL HIGH (ref 65–99)
Glucose-Capillary: 205 mg/dL — ABNORMAL HIGH (ref 65–99)

## 2017-06-20 LAB — MAGNESIUM: MAGNESIUM: 2.1 mg/dL (ref 1.7–2.4)

## 2017-06-20 MED ORDER — GLUCERNA SHAKE PO LIQD
237.0000 mL | Freq: Two times a day (BID) | ORAL | Status: DC
  Administered 2017-06-20 – 2017-06-22 (×2): 237 mL via ORAL

## 2017-06-20 MED ORDER — DIPHENHYDRAMINE HCL 50 MG/ML IJ SOLN
25.0000 mg | Freq: Once | INTRAMUSCULAR | Status: AC
Start: 2017-06-20 — End: 2017-06-20
  Administered 2017-06-20: 25 mg via INTRAVENOUS
  Filled 2017-06-20: qty 1

## 2017-06-20 NOTE — Progress Notes (Signed)
Removed trach sutures per MD order with no bleeding. VS stable on 28% ATC.

## 2017-06-20 NOTE — Progress Notes (Signed)
  Speech Language Pathology Treatment: Nada Boozer Speaking valve;Dysphagia  Patient Details Name: Amanda Short MRN: [id number] DOB: Oct 28, 1940 Today's Date: 06/20/2017 Time: 6945-0388 SLP Time Calculation (min) (ACUTE ONLY): 12 min  Assessment / Plan / Recommendation Clinical Impression  Pt progressing well towards goals, sitting in chair, husband present and PMV donned. Vocal quality clear with adequate intensity and 100% intelligible in conversation; vitals stable. Goal to educate and observe spouse donn and doff valve which he reports he has "taken it on and off before." SLP educated re: cuff and explained/demonstrated pilot balloon must be flat prior to donning valve. Pt's diet had been changed to Dys 3 (not by speech and pt/family unaware). SLP discussed the differences between regular and mechanical soft and chose to return to regular texture. No difficulty masticating cracker or s/s aspiration with thin liquids. Plan for CIR if able.     HPI HPI: 76 y.o.female with history of depression, DVT with pulmonary embolism, OSA, and diabetes mellitus type 2 presented on 11/21 with altered mentation, status elipticus.  MRI without obvious focal source. ETT 11/21; extubated 11/27, but with immediate stridor/vocal fold edema; unable to reintubate and required emergent trach.       SLP Plan  Continue with current plan of care       Recommendations  Diet recommendations: Regular;Thin liquid Liquids provided via: Cup;No straw Medication Administration: Whole meds with liquid Supervision: Patient able to self feed;Intermittent supervision to cue for compensatory strategies Compensations: Slow rate;Small sips/bites Postural Changes and/or Swallow Maneuvers: Seated upright 90 degrees      Patient may use Passy-Muir Speech Valve: During PO intake/meals;During all waking hours (remove during sleep) PMSV Supervision: Intermittent MD: Please consider changing trach tube to : Cuffless          Oral Care Recommendations: Oral care BID Follow up Recommendations: Inpatient Rehab SLP Visit Diagnosis: Dysphagia, pharyngeal phase (R13.13) Plan: Continue with current plan of care                       Houston Siren 06/20/2017, 10:23 AM   Orbie Pyo Colvin Caroli.Ed [employer] [phone]

## 2017-06-20 NOTE — Progress Notes (Signed)
Physical Therapy Treatment Patient Details Name: Amanda Short MRN: [id number] DOB: 10-15-1940 Today's Date: 06/20/2017    History of Present Illness Patient is a 76 yo female admitted 06/06/17 with AMS and seizures.  Patient was intubated to protect airway.  Failed extubation, patient had emergent tracheotomy.   PMH:  anemia, chronic resp failure on 2.5 L O2 at home, depression, anxiety, DM, HTN   PT Comments    Pt progressing with mobility. Demonstrated improved standing tolerance and ambulate from chair to bed with RW and minA. Continues to have decreased static and dynamic stability, requiring assist for balance. Still feel pt would benefit from CIR-level therapies at d/c to maximize functional mobility and independence. Will continue to follow acutely.   Follow Up Recommendations  CIR;Supervision/Assistance - 24 hour     Equipment Recommendations  None recommended by PT    Recommendations for Other Services       Precautions / Restrictions Precautions Precautions: Fall Precaution Comments: trach Restrictions Weight Bearing Restrictions: No    Mobility  Bed Mobility Overal bed mobility: Needs Assistance Bed Mobility: Sit to Supine       Sit to supine: Min guard   General bed mobility comments: Sitting in chair upon arrival  Transfers Overall transfer level: Needs assistance Equipment used: Rolling walker (2 wheeled) Transfers: Sit to/from Stand Sit to Stand: Min assist         General transfer comment: MinA to assist trunk into elevation; repeated cues for safe hand placement pushing from recliner, as pt repeatedly attempting to pull on RW. Initially reliant on posterior BLE support on chair secondary to bilat knee instability, but balance improved when marching initiated and cues for upright posture  Ambulation/Gait Ambulation/Gait assistance: Min assist Ambulation Distance (Feet): 3 Feet Assistive device: Rolling walker (2 wheeled) Gait  Pattern/deviations: Step-through pattern;Decreased stride length;Leaning posteriorly Gait velocity: Decreased Gait velocity interpretation: <1.8 ft/sec, indicative of risk for recurrent falls General Gait Details: Able to amb from bed to chair with RW and minA for balance; intermittent bilat knee instability pt able to self-correct. Further mobility limited secondary to pt fatigue and vent length   Stairs            Wheelchair Mobility    Modified Rankin (Stroke Patients Only)       Balance Overall balance assessment: Needs assistance;History of Falls Sitting-balance support: Feet supported;No upper extremity supported Sitting balance-Leahy Scale: Fair Sitting balance - Comments: Able to sit without support of chair back and slide back on EOB with min guard Postural control: Posterior lean Standing balance support: Bilateral upper extremity supported Standing balance-Leahy Scale: Poor Standing balance comment: requires UE support                            Cognition Arousal/Alertness: Awake/alert Behavior During Therapy: WFL for tasks assessed/performed Overall Cognitive Status: Difficult to assess Area of Impairment: Attention;Following commands;Safety/judgement;Problem solving                   Current Attention Level: Selective   Following Commands: Follows one step commands inconsistently Safety/Judgement: Decreased awareness of safety   Problem Solving: Slow processing        Exercises      General Comments General comments (skin integrity, edema, etc.): VSS. Husband present throughout and very supportive      Pertinent Vitals/Pain Pain Assessment: Faces Faces Pain Scale: Hurts a little bit Pain Location: Neck Pain Descriptors / Indicators: Sore;Tightness Pain Intervention(s):  Monitored during session;Repositioned    Home Living                      Prior Function            PT Goals (current goals can now be found in  the care plan section) Acute Rehab PT Goals Patient Stated Goal: none discussed PT Goal Formulation: With patient Time For Goal Achievement: 06/30/17 Potential to Achieve Goals: Good Progress towards PT goals: Progressing toward goals    Frequency    Min 3X/week      PT Plan Current plan remains appropriate    Co-evaluation              AM-PAC PT "6 Clicks" Daily Activity  Outcome Measure  Difficulty turning over in bed (including adjusting bedclothes, sheets and blankets)?: A Little Difficulty moving from lying on back to sitting on the side of the bed? : A Little Difficulty sitting down on and standing up from a chair with arms (e.g., wheelchair, bedside commode, etc,.)?: A Little Help needed moving to and from a bed to chair (including a wheelchair)?: A Lot Help needed walking in hospital room?: A Lot Help needed climbing 3-5 steps with a railing? : A Lot 6 Click Score: 15    End of Session Equipment Utilized During Treatment: Oxygen;Gait belt Activity Tolerance: Patient tolerated treatment well Patient left: in bed;with call bell/phone within reach;with family/visitor present;with bed alarm set Nurse Communication: Mobility status PT Visit Diagnosis: Unsteadiness on feet (R26.81);Other abnormalities of gait and mobility (R26.89);Muscle weakness (generalized) (M62.81)     Time: 5583-1674 PT Time Calculation (min) (ACUTE ONLY): 20 min  Charges:  $Therapeutic Activity: 8-22 mins                    G Codes:      Mabeline Caras, PT, DPT Acute Rehab Services  Pager: [place] 06/20/2017, 12:27 PM

## 2017-06-20 NOTE — Progress Notes (Signed)
PROGRESS NOTE    Amanda Short  OHY:[id number] DOB: [birth year] DOA: 06/06/2017 PCP: System, Pcp Not In   Chief Complaint  Patient presents with  . Altered Mental Status    Brief Narrative:  HPI on 06/06/2017 by Dr. Jennet Maduro (PCCM) Pt is encephelopathic; therefore, this HPI is obtained from chart review. Amanda Short is a 76 y.o. female with PMH as outlined below.  She was brought to [facility] ED 11/21 with AMS.  She was last seen normal 4 - 5 hours prior.  Initially called as code stroke. En route to ED, had seizure for which she was given 2.28m of versed.  In ED, had recurrent CT after returning from CT scan.  Received 659mativan and later had decrease in mental status / post ictal state where she was not protecting airway. She was subsequently intubated and PCCM was asked to admit to the ICU.  Neurology consulted by EDP. No known hx of seizures.  Interim history Patient received trach and transferred to stepdown. TRH assumed care on 06/15/2017. Patient improving. Possibly CIR candidate.  Assessment & Plan   Status epilepticus, new seizure disorder -patient presented with seizure and was intubated for airway protection -Neurology consulted and appreciated, suspected low magnesium at to be a possible precipitant  -She was placed on Keppra 75074mID, will continue -EEG results as below -Has not had further episodes since admission -will likely need neurology outpatient follow up  Acute on chronic hypoxic respiratory fialure -suspected retropharyngeal edema/vocal cord edema s/p emergent trach on 06/12/17 after failing extubation attempt -Currently not on vent -PCCM continues to follow, plan for change to downsize to 4 cuffless trach this week -Speech therapy consulted to initiate passy muir trials -Per husband, patient uses 2.5 L of oxygen at home  Fall -patient fell yesterday evening/last night -CT head/cervical spine: No acute cranial process. Moderate cerebral atrophy  with chronic small vessel ischemic disease. No acute rheumatic injury within the cervical spine. 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 -Patient currently stable, will continue to monitor closely  Chronic Anemia -Possibly gastritis versus GI bleed -Hemoglobin dropped to 7.2, received no blood transfusions -last hemoglobin 9.2 (baseline 10-11) -Patient had a fairly traumatic tracheostomy placement with some blood loss and has been on heparin -Currently no evidence of bleeding -Continue to monitor CBC  Right lower lobe atelecatsis vs aspiration pneumonia -was placed on vancomycin (discontinued on 11/30) and Zosyn (discontinued 12/3)  Hypomagnesemia -Magnesium 2 after replacement -pending labs today  Hypocalcemia -corrected calcium WNL -continue to monitor   Hypokalemia -Pending labs today  Acute encephalopathy -Resolved -possibly has some sundowning in the evening, may be secondary to medications vs hospital stay  Depression/anxiety -Continue cymbalta and ativan  OSA -Continue trach  History of DVT/PE -Currently on heparin -Restarted on xarelto  Protein calorie malnutrition -was on tube feeds, however, patient pulled out her NG tube -Speech therapy consulted, now on dysphagia 3 diet  Diabetes mellitus, type II -Continue insulin sliding scale with CBG monitoring   Essential hypertension - Stable will continue metoprolol, spironolactone  Hypothyroidism -Continue Synthroid  Physical deconditioning -PT/OT consulted, recommended CIR -CIR consulted and patient potential candidate  DVT Prophylaxis  heparin  Code Status: Full  Family Communication: Husband at bedside  Disposition Plan: Admitted. dispo pending- CIR consulted, [facility].  Consultants PCCM Neurology  Procedures  CT HEAD W/O 11/21:  No acute cortically based infarct or intracranial hemorrhage identified. ASPECTS is 10.  Increased chronic  nonspecific  cerebral volume loss since 2014. 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 PORT CXR 11/22:  Previously reviewed by me. Persistent silhouetting left hemidiaphragm suggestive of opacification versus consolidation. Endotracheal tube in good position. Enteric feeding tube coursing below diaphragm. EEG 11/23: This EEG is indicative of a severe diffuse encephalopathy, non-specific as to etiology. Sedating medication effect cannot be excluded. No epileptiform discharges, seizures, focal or lateralizing signs are seen.Compared to previous epoch, there is minimal improvement in the degree of encephalopathy. 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 Sedating medication effectis strongly suspected as the main driver. No epileptiform discharges, seizures, focal or lateralizing signs are seen. MRI BRAIN W/O 11/24: IMPRESSION: 1. No acute abnormality. 2. Chronic ischemic microangiopathy. 3. Advanced cerebral volume loss that is asymmetrically worse on the left to a mild degree.  Antibiotics   Anti-infectives (From admission, onward)   Start     Dose/Rate Route Frequency Ordered Stop   06/13/17 0100  vancomycin (VANCOCIN) IVPB 750 mg/150 ml premix  Status:  Discontinued     750 mg 150 mL/hr over 60 Minutes Intravenous Every 12 hours 06/12/17 1335 06/15/17 1022   06/12/17 1200  piperacillin-tazobactam (ZOSYN) IVPB 3.375 g  Status:  Discontinued     3.375 g 12.5 mL/hr over 240 Minutes Intravenous Every 8 hours 06/12/17 1118 06/19/17 1128   06/12/17 1130  vancomycin (VANCOCIN) 1,500 mg in sodium chloride 0.9 % 500 mL IVPB     1,500 mg 250 mL/hr over 120 Minutes Intravenous  Once 06/12/17 1118 06/12/17 1351      Subjective:   [name] Pt has no new complaints reported today.  Objective:   Vitals:   06/20/17 0927 06/20/17 1114 06/20/17 1122 06/20/17 1209  BP:      Pulse: 74  66    Resp:  18    Temp:    99 F (37.2 C)  TempSrc:    Oral  SpO2:  93% 100%   Weight:      Height:        Intake/Output Summary (Last 24 hours) at 06/20/2017 1423 Last data filed at 06/20/2017 1421 Gross per 24 hour  Intake 360 ml  Output 1200 ml  Net -840 ml   Filed Weights   06/18/17 0326 06/19/17 0303 06/20/17 0308  Weight: 102.7 kg (226 lb 6.6 oz) 99.4 kg (219 lb 2.2 oz) 97.4 kg (214 lb 11.7 oz)   Exam  General: Well developed, well nourished, NAD, alert and awake  HEENT: NCAT, mucous membranes moist.   Neck: Trach  Cardiovascular: S1 S2 auscultated, no murmurs, RRR  Respiratory: Clear to auscultation bilaterally with equal chest rise  Abdomen: Soft, nontender, nondistended, + bowel sounds  Extremities: warm dry without cyanosis clubbing or edema  Neuro: AAOx3, nonfocal  Psych: Appropriate mood and affect  Data Reviewed: I have personally reviewed following labs and imaging studies  CBC: Recent Labs  Lab 06/16/17 1049 06/17/17 0353 06/18/17 1209 06/19/17 1055 06/20/17 0300  WBC 9.0 9.2 10.5 12.1* 8.6  HGB 9.0* 9.8* 9.2* 9.8* 9.4*  HCT 29.5* 32.1* 29.7* 32.1* 30.2*  MCV 101.0* 101.9* 100.0 100.0 100.3*  PLT 367 418* 444* 429* 710   Basic Metabolic Panel: Recent Labs  Lab 06/16/17 1049 06/17/17 0353 06/18/17 1209 06/19/17 1055 06/20/17 0300  NA 142 139 140 141 138  K 3.7 3.8 3.6 3.4* 3.0*  CL 101 100* 100* 93* 89*  CO2 32 27 33* 36* 38*  GLUCOSE 138* 136* 195* 149* 148*  BUN _0 5* <5*  CREATININE 0.69 0.75 0.75 0.81 0.70  CALCIUM 8.6* 8.4* 8.7* 8.4* 8.2*  MG 1.2* 2.0  --  1.3* 2.1   GFR: Estimated Creatinine Clearance: 63.8 mL/min (by C-G formula based on SCr of 0.7 mg/dL). Liver Function Tests: Recent Labs  Lab 06/14/17 0432 06/16/17 1049  AST 20 12*  ALT 33 22  ALKPHOS 101 79  BILITOT 0.5 0.8  PROT 4.6* 4.9*  ALBUMIN 2.1* 2.1*   No results for input(s): LIPASE, AMYLASE in the last 168 hours. No results for input(s):  AMMONIA in the last 168 hours. Coagulation Profile: No results for input(s): INR, PROTIME in the last 168 hours. Cardiac Enzymes: No results for input(s): CKTOTAL, CKMB, CKMBINDEX, TROPONINI in the last 168 hours. BNP (last 3 results) No results for input(s): PROBNP in the last 8760 hours. HbA1C: No results for input(s): HGBA1C in the last 72 hours. CBG: Recent Labs  Lab 06/19/17 1206 06/19/17 1750 06/19/17 2117 06/20/17 0751 06/20/17 1222  GLUCAP 155* 144* 206* 130* 205*   Lipid Profile: No results for input(s): CHOL, HDL, LDLCALC, TRIG, CHOLHDL, LDLDIRECT in the last 72 hours. Thyroid Function Tests: No results for input(s): TSH, T4TOTAL, FREET4, T3FREE, THYROIDAB in the last 72 hours. Anemia Panel: No results for input(s): VITAMINB12, FOLATE, FERRITIN, TIBC, IRON, RETICCTPCT in the last 72 hours. Urine analysis:    Component Value Date/Time   COLORURINE STRAW (A) 06/06/2017 1547   APPEARANCEUR CLEAR 06/06/2017 1547   LABSPEC 1.006 06/06/2017 1547   PHURINE 6.0 06/06/2017 1547   GLUCOSEU NEGATIVE 06/06/2017 1547   HGBUR NEGATIVE 06/06/2017 1547   BILIRUBINUR NEGATIVE 06/06/2017 1547   KETONESUR NEGATIVE 06/06/2017 1547   PROTEINUR 30 (A) 06/06/2017 1547   UROBILINOGEN 0.2 03/31/2013 2006   NITRITE NEGATIVE 06/06/2017 1547   LEUKOCYTESUR NEGATIVE 06/06/2017 1547   Sepsis Labs: _1 (procalcitonin:4,lacticidven:4)  ) Recent Results (from the past 240 hour(s))  Culture, respiratory (NON-Expectorated)     Status: None   Collection Time: 06/13/17 12:12 PM  Result Value Ref Range Status   Specimen Description TRACHEAL ASPIRATE  Final   Special Requests NONE  Final   Gram Stain   Final    RARE WBC PRESENT, PREDOMINANTLY PMN RARE GRAM POSITIVE COCCI IN PAIRS    Culture RARE STREPTOCOCCUS GROUP F  Final   Report Status 06/15/2017 FINAL  Final      Radiology Studies: Ct Head Wo Contrast  Result Date: 06/19/2017 CLINICAL DATA:  Initial evaluation for under  witnessed fall. EXAM: CT HEAD WITHOUT CONTRAST CT CERVICAL SPINE WITHOUT CONTRAST TECHNIQUE: Multidetector CT imaging of the head and cervical spine was performed following the standard protocol without intravenous contrast. Multiplanar CT image reconstructions of the cervical spine were also generated. COMPARISON:  Prior CT from 06/06/2017. FINDINGS: CT HEAD FINDINGS Brain: Atrophy with chronic small vessel ischemic disease. No acute intracranial hemorrhage. No evidence for acute large vessel territory infarct. No mass lesion, midline shift or mass effect. No hydrocephalus. No extra-axial fluid collection. Vascular: No hyperdense vessel. Scattered vascular calcifications noted within the carotid siphons. Skull: Scalp soft tissues and calvarium within normal limits. Sinuses/Orbits: Globes normal soft tissues normal. Paranasal sinuses clear. Small bilateral mastoid effusions. CT CERVICAL SPINE FINDINGS Alignment: Study degraded by motion artifact. Reversal of the normal cervical lordosis, apex at C5. Trace anterolisthesis of C7 on T1. Skull base and vertebrae: Skullbase intact. Normal C1-2 articulations are preserved in the dens is intact. Vertebral body heights  maintained. No acute fracture. Soft tissues and spinal canal: Soft tissues of the neck demonstrate no acute abnormality. No prevertebral edema. Spinal canal within normal limits. Tracheostomy tube partially visualized. 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 Upper chest: Partially visualized upper chest without acute abnormality. Left IJ approach centra venous catheter partially visualized. Partially visualized lung apices grossly clear. IMPRESSION: 1. No acute intracranial process. 2. Moderate cerebral atrophy with chronic small vessel ischemic disease. 3. No acute traumatic injury within cervical spine. 4. 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 Electronically Signed   By: Jeannine Boga M.D.   On:  06/19/2017 04:58   Ct Cervical Spine Wo Contrast  Result Date: 06/19/2017 CLINICAL DATA:  Initial evaluation for under witnessed fall. EXAM: CT HEAD WITHOUT CONTRAST CT CERVICAL SPINE WITHOUT CONTRAST TECHNIQUE: Multidetector CT imaging of the head and cervical spine was performed following the standard protocol without intravenous contrast. Multiplanar CT image reconstructions of the cervical spine were also generated. COMPARISON:  Prior CT from 06/06/2017. FINDINGS: CT HEAD FINDINGS Brain: Atrophy with chronic small vessel ischemic disease. No acute intracranial hemorrhage. No evidence for acute large vessel territory infarct. No mass lesion, midline shift or mass effect. No hydrocephalus. No extra-axial fluid collection. Vascular: No hyperdense vessel. Scattered vascular calcifications noted within the carotid siphons. Skull: Scalp soft tissues and calvarium within normal limits. Sinuses/Orbits: Globes normal soft tissues normal. Paranasal sinuses clear. Small bilateral mastoid effusions. CT CERVICAL SPINE FINDINGS Alignment: Study degraded by motion artifact. Reversal of the normal cervical lordosis, apex at C5. Trace anterolisthesis of C7 on T1. Skull base and vertebrae: Skullbase intact. Normal C1-2 articulations are preserved in the dens is intact. Vertebral body heights maintained. No acute fracture. Soft tissues and spinal canal: Soft tissues of the neck demonstrate no acute abnormality. No prevertebral edema. Spinal canal within normal limits. Tracheostomy tube partially visualized. 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 Upper chest: Partially visualized upper chest without acute abnormality. Left IJ approach centra venous catheter partially visualized. Partially visualized lung apices grossly clear. IMPRESSION: 1. No acute intracranial process. 2. Moderate cerebral atrophy with chronic small vessel ischemic disease. 3. No acute traumatic injury within cervical spine. 4.  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 Electronically Signed   By: Jeannine Boga M.D.   On: 06/19/2017 04:58     Scheduled Meds: . albuterol  2.5 mg Nebulization Q6H  . chlorhexidine  15 mL Mouth Rinse BID  . DULoxetine  60 mg Oral Daily  . insulin aspart  0-9 Units Subcutaneous TID WC  . levothyroxine  100 mcg Oral QAC breakfast  . mouth rinse  15 mL Mouth Rinse q12n4p  . metoprolol succinate  100 mg Oral Daily  . pantoprazole  40 mg Oral Daily  . rivaroxaban  20 mg Oral Q supper  . spironolactone  50 mg Oral Daily   Continuous Infusions: . levETIRAcetam Stopped (06/20/17 0357)     LOS: 14 days   Time Spent in minutes   30 minutes  Hildreth Robart D.O. on 06/20/2017 at 2:23 PM  Between 7am to 7pm - Pager - [phone]  After 7pm go to [email or web address] - password [email or web address]  And look for the night coverage person covering for me after hours  [facility] Office  [phone]

## 2017-06-20 NOTE — Progress Notes (Signed)
[facility]  Patient Status: Active (receiving services up to time of hospitalization)  [facility] is providing the following services: RN and PT  If patient discharges after hours, please call [phone].   Amanda Short 06/20/2017, 4:11 PM

## 2017-06-20 NOTE — Progress Notes (Signed)
Nutrition Follow-up  DOCUMENTATION CODES:   Obesity unspecified  INTERVENTION:   -Glucerna Shake po BID, each supplement provides 220 kcal and 10 grams of protein  NUTRITION DIAGNOSIS:   Increased nutrient needs related to acute illness as evidenced by estimated needs.  Ongoing  GOAL:   Patient will meet greater than or equal to 90% of their needs  Progressing  MONITOR:   PO intake, Supplement acceptance, Diet advancement, Labs, Weight trends, Skin, I & O's  REASON FOR ASSESSMENT:   Consult Enteral/tube feeding initiation and management  ASSESSMENT:   76 yo female with PMH of breast CA (2016), HLD, HTN, depression, DVT, pulmonary embolism, OSA, DM-2, who was admitted on 11/21 with status epilepticus. Required intubation on admission.  11/27- trach placved 11/28- cortrak tube placed 11.30- PSMV trials initiated, pt removed cortrak tube 12/2- s/p MBSS, advanced to regular diet with thin liquids  Pt sleeping soundly at time of visit. RD did not not disturb.   Spoke with SLP, who reports pt is making great progress with diet. Noted meal completion variable (PO: 10-75%). Plan to admit to CIR (if able to accommodate respiratory requirements) vs LTACH.  Labs reviewed: CBGS: 130-206 (inpatient orders for glycemic control are 0-9 units insulin aspart TID with meals).   Diet Order:  Diet Carb Modified Fluid consistency: Thin; Room service appropriate? Yes  EDUCATION NEEDS:   No education needs have been identified at this time  Skin:  Skin Assessment: Reviewed RN Assessment  Last BM:  06/17/17  Height:   Ht Readings from Last 1 Encounters:  06/09/17 _0  (1.549 m)    Weight:   Wt Readings from Last 1 Encounters:  06/20/17 214 lb 11.7 oz (97.4 kg)    Ideal Body Weight:  47.7 kg  BMI:  Body mass index is 40.57 kg/m.  Estimated Nutritional Needs:   Kcal:  1400-1600  Protein:  95-110 grams  Fluid:  > 1.4 L    Sanjiv Castorena A. Jimmye Norman, RD, LDN,  CDE Pager: [phone] After hours Pager: [phone]

## 2017-06-20 NOTE — Care Management Note (Addendum)
Case Management Note  Patient Details  Name: Amanda Short MRN: [id number] Date of Birth: 1940-10-03  Subjective/Objective:     Pt admitted with seizures               Action/Plan:  PTA independent from home with husband.  Pt had to be emergently trached 11/27 post failing extubation.  Pt is already on TC 11/29 and per PCCM will likely wean very quickly.  Tentative discharge plan will be SNF - CM will continue to follow for discharge needs   Expected Discharge Date:                  Expected Discharge Plan:     In-House Referral:     Discharge planning Services     Post Acute Care Choice:    Choice offered to:     DME Arranged:    DME Agency:     HH Arranged:    HH Agency:     Status of Service:     If discussed at [employer] of Stay Meetings, dates discussed:    Additional Comments: 06/20/2017  Therapy to titrate oxygen down - will continue to follow  Physician Advisor in agreement for [facility] referral due to pts inability to titrate down on oxygen needs - lack of discharge options at current level.  CM text paged MD to discuss potential LTACH referral  06/19/17 Discussed in LOS 12/4 - pt remains appropriate for continued stay.  Pt remains on TC however requiring 60% FIO2 (neither CIR nor SNF can accept at current need).  Pt remains inappropriate for LTACH at this time per physician advisor - however CM will continue to follow   06/15/17 Pt moved to [place].  Pt is on TC 40%, PMV not yet utilized as of yet.  Pt remains on IV hepairn.  Per bedside nurse pt is 2+ assist.  PT/OT eval ordered Maryclare Labrador, RN 06/20/2017, 8:32 AM

## 2017-06-21 LAB — GLUCOSE, CAPILLARY
GLUCOSE-CAPILLARY: 151 mg/dL — AB (ref 65–99)
GLUCOSE-CAPILLARY: 147 mg/dL — AB (ref 65–99)
Glucose-Capillary: 197 mg/dL — ABNORMAL HIGH (ref 65–99)
Glucose-Capillary: 164 mg/dL — ABNORMAL HIGH (ref 65–99)

## 2017-06-21 MED ORDER — POTASSIUM CHLORIDE CRYS ER 20 MEQ PO TBCR
40.0000 meq | EXTENDED_RELEASE_TABLET | Freq: Once | ORAL | Status: AC
  Administered 2017-06-21: 40 meq via ORAL
  Filled 2017-06-21: qty 2

## 2017-06-21 NOTE — Progress Notes (Addendum)
Inpatient Rehabilitation  Continuing to follow for timing of medical readiness, insurance authorization, and IP Rehab bed availability.  Note progress with decreasing FiO2 needs and have initiated insurance authorization.  Plan to follow up with patient and spouse later today.  Call if questions.   Addendum: Met with patient and spouse, reviewed insurance verification letter, and answered initial questions.  Will notify team when I have an insurance decision.  Call if questions.   Carmelia Roller., CCC/SLP Admission Coordinator  [place]  Cell [phone]

## 2017-06-21 NOTE — Progress Notes (Signed)
Pt. Has fluctuated with Fio2 40% to 28%. Currently on 28% at 5L sats 95%. Will destat to 84% and come back up to 88% after instructions on deep breathing.

## 2017-06-21 NOTE — Progress Notes (Signed)
PROGRESS NOTE    Amanda Short  GLO:[id number] DOB: 19-Jul-1940 DOA: 06/06/2017 PCP: System, Pcp Not In   Chief Complaint  Patient presents with  . Altered Mental Status    Brief Narrative:  HPI on 06/06/2017 by Dr. Jennet Maduro (PCCM) Pt is encephelopathic; therefore, this HPI is obtained from chart review. Amanda Short is a 76 y.o. female with PMH as outlined below.  She was brought to [facility] ED 11/21 with AMS.  She was last seen normal 4 - 5 hours prior.  Initially called as code stroke. En route to ED, had seizure for which she was given 2.64m of versed.  In ED, had recurrent CT after returning from CT scan.  Received 635mativan and later had decrease in mental status / post ictal state where she was not protecting airway. She was subsequently intubated and PCCM was asked to admit to the ICU.  Neurology consulted by EDP. No known hx of seizures.  Interim history Patient received trach and transferred to stepdown. TRH assumed care on 06/15/2017. Patient improving. Possibly CIR candidate.  Assessment & Plan   Status epilepticus, new seizure disorder -patient presented with seizure and was intubated for airway protection -Neurology consulted and appreciated, suspected low magnesium at to be a possible precipitant  -She was placed on Keppra 75059mID, will continue -results of EEG below. -No further seizures reported. -Neuro f/u as outpatient  Acute on chronic hypoxic respiratory fialure -suspected retropharyngeal edema/vocal cord edema s/p emergent trach on 06/12/17 after failing extubation attempt -PCCM continues to follow, plan for change to downsize to 4 cuffless trach this week -Speech therapy consulted to initiate passy muir trials -Per husband, patient uses 2.5 L of oxygen at home  Fall -patient fell yesterday evening/last night -CT head/cervical spine: No acute cranial process. Moderate cerebral atrophy with chronic small vessel ischemic disease. No acute rheumatic  injury within the cervical spine. 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 -Patient currently stable, will continue to monitor closely  Chronic Anemia -Possibly gastritis versus GI bleed -Hemoglobin dropped to 7.2, received no blood transfusions -last hemoglobin 9.2 (baseline 10-11) -Patient had a fairly traumatic tracheostomy placement with some blood loss and has been on heparin -Currently no evidence of bleeding -Continue to monitor CBC  Right lower lobe atelecatsis vs aspiration pneumonia -was placed on vancomycin (discontinued on 11/30) and Zosyn (discontinued 12/3)  Hypomagnesemia -Magnesium 2 after replacement on last check -pending labs today  Hypocalcemia -corrected calcium WNL -continue to monitor   Hypokalemia -Pending labs today  Acute encephalopathy -Resolved -possibly has some sundowning in the evening, may be secondary to medications vs hospital stay  Depression/anxiety -Continue cymbalta and ativan  OSA -Continue trach  History of DVT/PE -Currently on heparin -Restarted on xarelto  Protein calorie malnutrition -was on tube feeds, however, patient pulled out her NG tube -Speech therapy consulted, now on dysphagia 3 diet  Diabetes mellitus, type II -Continue insulin sliding scale with CBG monitoring   Essential hypertension - Stable will continue metoprolol, spironolactone  Hypothyroidism - Continue Synthroid  Physical deconditioning - PT/OT consulted, recommended CIR - CIR consulted and patient potential candidate  DVT Prophylaxis  heparin  Code Status: Full  Family Communication: Husband at bedside  Disposition Plan:  dispo pending- CIR consulted  Consultants PCCM Neurology  Procedures  CT HEAD W/O 11/21:  No acute cortically based infarct or intracranial hemorrhage identified. ASPECTS is 10.  Increased chronic nonspecific cerebral volume loss since 2014. Stable mild to moderate nonspecific white matter  disease. PORT CXR 11/22:  Previously reviewed by me. Persistent silhouetting left hemidiaphragm suggestive of opacification versus consolidation. Endotracheal tube in good position. Enteric feeding tube coursing below diaphragm. EEG 11/23: This EEG is indicative of a severe diffuse encephalopathy, non-specific as to etiology. Sedating medication effect cannot be excluded. No epileptiform discharges, seizures, focal or lateralizing signs are seen.Compared to previous epoch, there is minimal improvement in the degree of encephalopathy. 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 Sedating medication effectis strongly suspected as the main driver. No epileptiform discharges, seizures, focal or lateralizing signs are seen. MRI BRAIN W/O 11/24: IMPRESSION: 1. No acute abnormality. 2. Chronic ischemic microangiopathy. 3. Advanced cerebral volume loss that is asymmetrically worse on the left to a mild degree.  Antibiotics   Anti-infectives (From admission, onward)   Start     Dose/Rate Route Frequency Ordered Stop   06/13/17 0100  vancomycin (VANCOCIN) IVPB 750 mg/150 ml premix  Status:  Discontinued     750 mg 150 mL/hr over 60 Minutes Intravenous Every 12 hours 06/12/17 1335 06/15/17 1022   06/12/17 1200  piperacillin-tazobactam (ZOSYN) IVPB 3.375 g  Status:  Discontinued     3.375 g 12.5 mL/hr over 240 Minutes Intravenous Every 8 hours 06/12/17 1118 06/19/17 1128   06/12/17 1130  vancomycin (VANCOCIN) 1,500 mg in sodium chloride 0.9 % 500 mL IVPB     1,500 mg 250 mL/hr over 120 Minutes Intravenous  Once 06/12/17 1118 06/12/17 1351      Subjective:   Amanda Short  Reports no new complaints today.  Objective:   Vitals:   06/21/17 0839 06/21/17 0856 06/21/17 1053 06/21/17 1121  BP:    (!) 152/76  Pulse: 64 62 64 62  Resp:  _0 Temp:    98.4 F (36.9 C)  TempSrc:    Oral  SpO2:  92%  93% (!) 87%  Weight:      Height:        Intake/Output Summary (Last 24 hours) at 06/21/2017 1338 Last data filed at 06/21/2017 0600 Gross per 24 hour  Intake 980 ml  Output 200 ml  Net 780 ml   Filed Weights   06/19/17 0303 06/20/17 0308 06/21/17 0315  Weight: 99.4 kg (219 lb 2.2 oz) 97.4 kg (214 lb 11.7 oz) 96 kg (211 lb 10.3 oz)   Exam  General: Well developed, well nourished, NAD, alert and awake  HEENT: NCAT, mucous membranes moist.   Neck: Trach, no goiter  Cardiovascular: S1 S2 auscultated, no murmurs, RRR  Respiratory: Clear to auscultation bilaterally with equal chest rise,   Abdomen: Soft, nontender, nondistended, + bowel sounds  Extremities: warm dry without cyanosis clubbing or edema  Neuro: AAOx3, nonfocal  Psych: Appropriate mood and affect  Data Reviewed: I have personally reviewed following labs and imaging studies  CBC: Recent Labs  Lab 06/16/17 1049 06/17/17 0353 06/18/17 1209 06/19/17 1055 06/20/17 0300  WBC 9.0 9.2 10.5 12.1* 8.6  HGB 9.0* 9.8* 9.2* 9.8* 9.4*  HCT 29.5* 32.1* 29.7* 32.1* 30.2*  MCV 101.0* 101.9* 100.0 100.0 100.3*  PLT 367 418* 444* 429* 157   Basic Metabolic Panel: Recent Labs  Lab 06/16/17 1049 06/17/17 0353 06/18/17 1209 06/19/17 1055 06/20/17 0300  NA 142 139 140 141 138  K 3.7 3.8 3.6 3.4* 3.0*  CL 101 100* 100* 93* 89*  CO2 32 27 33* 36* 38*  GLUCOSE 138* 136* 195* 149* 148*  BUN _0 5* <5*  CREATININE 0.69 0.75 0.75 0.81 0.70  CALCIUM 8.6* 8.4* 8.7* 8.4* 8.2*  MG 1.2* 2.0  --  1.3* 2.1   GFR: Estimated Creatinine Clearance: 63.4 mL/min (by C-G formula based on SCr of 0.7 mg/dL). Liver Function Tests: Recent Labs  Lab 06/16/17 1049  AST 12*  ALT 22  ALKPHOS 79  BILITOT 0.8  PROT 4.9*  ALBUMIN 2.1*   No results for input(s): LIPASE, AMYLASE in the last 168 hours. No results for input(s): AMMONIA in the last 168 hours. Coagulation Profile: No results for input(s): INR, PROTIME in the last  168 hours. Cardiac Enzymes: No results for input(s): CKTOTAL, CKMB, CKMBINDEX, TROPONINI in the last 168 hours. BNP (last 3 results) No results for input(s): PROBNP in the last 8760 hours. HbA1C: No results for input(s): HGBA1C in the last 72 hours. CBG: Recent Labs  Lab 06/20/17 1222 06/20/17 1631 06/20/17 2107 06/21/17 0742 06/21/17 1141  GLUCAP 205* 182* 159* 151* 197*   Lipid Profile: No results for input(s): CHOL, HDL, LDLCALC, TRIG, CHOLHDL, LDLDIRECT in the last 72 hours. Thyroid Function Tests: No results for input(s): TSH, T4TOTAL, FREET4, T3FREE, THYROIDAB in the last 72 hours. Anemia Panel: No results for input(s): VITAMINB12, FOLATE, FERRITIN, TIBC, IRON, RETICCTPCT in the last 72 hours. Urine analysis:    Component Value Date/Time   COLORURINE STRAW (A) 06/06/2017 1547   APPEARANCEUR CLEAR 06/06/2017 1547   LABSPEC 1.006 06/06/2017 1547   PHURINE 6.0 06/06/2017 1547   GLUCOSEU NEGATIVE 06/06/2017 1547   HGBUR NEGATIVE 06/06/2017 1547   BILIRUBINUR NEGATIVE 06/06/2017 1547   KETONESUR NEGATIVE 06/06/2017 1547   PROTEINUR 30 (A) 06/06/2017 1547   UROBILINOGEN 0.2 03/31/2013 2006   NITRITE NEGATIVE 06/06/2017 1547   LEUKOCYTESUR NEGATIVE 06/06/2017 1547   Sepsis Labs: _1 (procalcitonin:4,lacticidven:4)  ) Recent Results (from the past 240 hour(s))  Culture, respiratory (NON-Expectorated)     Status: None   Collection Time: 06/13/17 12:12 PM  Result Value Ref Range Status   Specimen Description TRACHEAL ASPIRATE  Final   Special Requests NONE  Final   Gram Stain   Final    RARE WBC PRESENT, PREDOMINANTLY PMN RARE GRAM POSITIVE COCCI IN PAIRS    Culture RARE STREPTOCOCCUS GROUP F  Final   Report Status 06/15/2017 FINAL  Final      Radiology Studies: No results found.   Scheduled Meds: . albuterol  2.5 mg Nebulization Q6H  . chlorhexidine  15 mL Mouth Rinse BID  . DULoxetine  60 mg Oral Daily  . feeding supplement (GLUCERNA SHAKE)  237  mL Oral BID BM  . insulin aspart  0-9 Units Subcutaneous TID WC  . levothyroxine  100 mcg Oral QAC breakfast  . mouth rinse  15 mL Mouth Rinse q12n4p  . metoprolol succinate  100 mg Oral Daily  . pantoprazole  40 mg Oral Daily  . rivaroxaban  20 mg Oral Q supper  . spironolactone  50 mg Oral Daily   Continuous Infusions: . levETIRAcetam Stopped (06/21/17 0315)     LOS: 15 days   Time Spent in minutes   30 minutes  Teka Chanda D.O. on 06/21/2017 at 1:38 PM  Between 7am to 7pm - Pager - [phone]  After 7pm go to [email or web address] - password [email or web address]  And look for the night coverage person covering for me after hours  [facility] Office  [phone]

## 2017-06-21 NOTE — Care Management Note (Signed)
Case Management Note  Patient Details  Name: Amanda Short MRN: [id number] Date of Birth: [birth year]  Subjective/Objective:     Pt admitted with seizures               Action/Plan:  PTA independent from home with husband.  Pt had to be emergently trached 11/27 post failing extubation.  Pt is already on TC 11/29 and per PCCM will likely wean very quickly.  Tentative discharge plan will be SNF - CM will continue to follow for discharge needs   Expected Discharge Date:                  Expected Discharge Plan:     In-House Referral:     Discharge planning Services     Post Acute Care Choice:    Choice offered to:     DME Arranged:    DME Agency:     HH Arranged:    HH Agency:     Status of Service:     If discussed at [employer] of Stay Meetings, dates discussed:    Additional Comments: 06/21/2017  Discussed in LOS 12/6 - pt remains appropriate for continued stay.  Pt was able to titrate down to 40% FIO2 and the goal for today will be to get pt to 28% - CIR has agreed to initiate insurance auth and SNF will be the back up plan as pt no longer has the intensity for LTACH referral  06/20/17 Therapy to titrate oxygen down - will continue to follow  Physician Advisor in agreement for [facility] referral due to pts inability to titrate down on oxygen needs - lack of discharge options at current level.  CM text paged MD to discuss potential LTACH referral  06/19/17 Discussed in LOS 12/4 - pt remains appropriate for continued stay.  Pt remains on TC however requiring 60% FIO2 (neither CIR nor SNF can accept at current need).  Pt remains inappropriate for LTACH at this time per physician advisor - however CM will continue to follow   06/15/17 Pt moved to [place].  Pt is on TC 40%, PMV not yet utilized as of yet.  Pt remains on IV hepairn.  Per bedside nurse pt is 2+ assist.  PT/OT eval ordered Maryclare Labrador, RN 06/21/2017, 9:57 AM

## 2017-06-21 NOTE — Progress Notes (Signed)
Physical Therapy Treatment Patient Details Name: Amanda Short MRN: [id number] DOB: 1940-09-09 Today's Date: 06/21/2017    History of Present Illness Patient is a 76 yo female admitted 06/06/17 with AMS and seizures.  Patient was intubated to protect airway.  Failed extubation, patient had emergent tracheotomy.   PMH:  anemia, chronic resp failure on 2.5 L O2 at home, depression, anxiety, DM, HTN   PT Comments    Pt progressing with mobility. Able to ambulate ~30' with RW and minA+2. SpO2 remained >92% on 30% FiO2. Remains motivated to participate with therapies. Husband present throughout session and very supportive. Continue to recommend CIR-level therapies to maximize functional mobility and independence. Will continue to follow acutely.   Follow Up Recommendations  CIR;Supervision/Assistance - 24 hour     Equipment Recommendations  None recommended by PT    Recommendations for Other Services Rehab consult     Precautions / Restrictions Precautions Precautions: Fall Precaution Comments: trach Restrictions Weight Bearing Restrictions: No    Mobility  Bed Mobility Overal bed mobility: Needs Assistance Bed Mobility: Sit to Supine     Supine to sit: Min assist;HOB elevated     General bed mobility comments: Pt required assist to lift trunk   Transfers Overall transfer level: Needs assistance Equipment used: Rolling walker (2 wheeled) Transfers: Sit to/from Stand Sit to Stand: Min guard;+2 safety/equipment Stand pivot transfers: Min assist;+2 safety/equipment       General transfer comment: assist for balance, and to manage RW   Ambulation/Gait Ambulation/Gait assistance: Min assist;+2 physical assistance;+2 safety/equipment Ambulation Distance (Feet): 30 Feet Assistive device: Rolling walker (2 wheeled) Gait Pattern/deviations: Step-through pattern;Decreased stride length;Staggering right Gait velocity: Decreased Gait velocity interpretation: <1.8 ft/sec,  indicative of risk for recurrent falls General Gait Details: Slow, unsteady gait with RW and minA+2 for balance. Pt leaning to R-side throughout requiring 1x modA to maintain balance; multimodal cues to maintain center of mass towards L-side and sequencing with RW. Close chair follow secondary to fatigue with 1x seated rest break   Stairs            Wheelchair Mobility    Modified Rankin (Stroke Patients Only)       Balance Overall balance assessment: Needs assistance;History of Falls Sitting-balance support: Feet supported Sitting balance-Leahy Scale: Poor Sitting balance - Comments: Pt initially required min A for sitting balance EOB due to LOB posteriorly.  However, she progressed to close min gaurd assist.  She required min A to maintain balance while leaning forward to access feet to don socks  Postural control: Posterior lean;Right lateral lean Standing balance support: Bilateral upper extremity supported Standing balance-Leahy Scale: Poor Standing balance comment: requires UE support                            Cognition Arousal/Alertness: Awake/alert Behavior During Therapy: WFL for tasks assessed/performed Overall Cognitive Status: Impaired/Different from baseline Area of Impairment: Attention;Safety/judgement;Problem solving;Following commands                   Current Attention Level: Selective Memory: Decreased short-term memory Following Commands: Follows one step commands consistently;Follows multi-step commands inconsistently Safety/Judgement: Decreased awareness of safety   Problem Solving: Slow processing;Difficulty sequencing;Requires verbal cues        Exercises      General Comments General comments (skin integrity, edema, etc.): SpO2 remained >92% on 30% FiO2      Pertinent Vitals/Pain Pain Assessment: Faces Faces Pain Scale: No hurt  Home Living                      Prior Function            PT Goals  (current goals can now be found in the care plan section) Acute Rehab PT Goals Patient Stated Goal: To get better  PT Goal Formulation: With patient Time For Goal Achievement: 06/30/17 Potential to Achieve Goals: Good Progress towards PT goals: Progressing toward goals    Frequency    Min 3X/week      PT Plan Current plan remains appropriate    Co-evaluation PT/OT/SLP Co-Evaluation/Treatment: Yes Reason for Co-Treatment: For patient/therapist safety;To address functional/ADL transfers PT goals addressed during session: Mobility/safety with mobility;Proper use of DME OT goals addressed during session: ADL's and self-care      AM-PAC PT "6 Clicks" Daily Activity  Outcome Measure  Difficulty turning over in bed (including adjusting bedclothes, sheets and blankets)?: A Little Difficulty moving from lying on back to sitting on the side of the bed? : A Little Difficulty sitting down on and standing up from a chair with arms (e.g., wheelchair, bedside commode, etc,.)?: Unable Help needed moving to and from a bed to chair (including a wheelchair)?: A Little Help needed walking in hospital room?: A Lot Help needed climbing 3-5 steps with a railing? : A Lot 6 Click Score: 14    End of Session Equipment Utilized During Treatment: Oxygen;Gait belt Activity Tolerance: Patient tolerated treatment well Patient left: in chair;with call bell/phone within reach;with family/visitor present Nurse Communication: Mobility status PT Visit Diagnosis: Unsteadiness on feet (R26.81);Other abnormalities of gait and mobility (R26.89);Muscle weakness (generalized) (M62.81)     Time: 1127-1206 PT Time Calculation (min) (ACUTE ONLY): 39 min  Charges:  $Gait Training: 23-37 mins                    G Codes:      Mabeline Caras, PT, DPT Acute Rehab Services  Pager: [phone] 06/21/2017, 1:37 PM

## 2017-06-21 NOTE — Progress Notes (Signed)
Occupational Therapy Treatment Patient Details Name: Amanda Short MRN: [id number] DOB: [birth year] Today's Date: 06/21/2017    History of present illness Patient is a 76 yo female admitted 06/06/17 with AMS and seizures.  Patient was intubated to protect airway.  Failed extubation, patient had emergent tracheotomy.   PMH:  anemia, chronic resp failure on 2.5 L O2 at home, depression, anxiety, DM, HTN   OT comments  Seen in conjunction with PT.  Pt is making excellent progress.  She was able to don socks with mod A, overall while sitting EOB.  She ambulated ~25-30' with min A +2.  02 sats remained >92% on 30% FI02.   Continue to recommend CIR.   Follow Up Recommendations  CIR;Supervision/Assistance - 24 hour    Equipment Recommendations  None recommended by OT    Recommendations for Other Services      Precautions / Restrictions Precautions Precautions: Fall Precaution Comments: trach       Mobility Bed Mobility Overal bed mobility: Needs Assistance Bed Mobility: Sit to Supine     Supine to sit: Min assist;HOB elevated     General bed mobility comments: Pt required assist to lift trunk   Transfers Overall transfer level: Needs assistance Equipment used: Rolling walker (2 wheeled) Transfers: Sit to/from [facility] Sit to Stand: Min guard;+2 safety/equipment Stand pivot transfers: Min assist;+2 safety/equipment       General transfer comment: assist for balance, and to manage RW     Balance Overall balance assessment: Needs assistance;History of Falls Sitting-balance support: Feet supported Sitting balance-Leahy Scale: Poor Sitting balance - Comments: Pt initially required min A for sitting balance EOB due to LOB posteriorly.  However, she progressed to close min gaurd assist.  She required min A to maintain balance while leaning forward to access feet to don socks  Postural control: Posterior lean Standing balance support: Bilateral upper  extremity supported Standing balance-Leahy Scale: Poor Standing balance comment: requires UE support                           ADL either performed or assessed with clinical judgement   ADL Overall ADL's : Needs assistance/impaired                     Lower Body Dressing: Sit to/from stand;Maximal assistance Lower Body Dressing Details (indicate cue type and reason): Pt able to don Lt sock with mod A, max A for Rt sock  Toilet Transfer: Minimal assistance;+2 for physical assistance;Ambulation;Comfort height toilet;Regular Toilet;Grab bars;RW   Toileting- Clothing Manipulation and Hygiene: Maximal assistance;Sit to/from stand       Functional mobility during ADLs: Minimal assistance;+2 for physical assistance;Rolling walker       Vision       Perception     Praxis      Cognition Arousal/Alertness: Awake/alert Behavior During Therapy: WFL for tasks assessed/performed Overall Cognitive Status: Impaired/Different from baseline Area of Impairment: Attention;Safety/judgement;Problem solving;Following commands                   Current Attention Level: Selective   Following Commands: Follows one step commands consistently;Follows multi-step commands inconsistently Safety/Judgement: Decreased awareness of safety   Problem Solving: Slow processing;Difficulty sequencing;Requires verbal cues          Exercises     Shoulder Instructions       General Comments 02 sats remained >92% on 30% FI02    Pertinent Vitals/ Pain  Pain Assessment: Faces Faces Pain Scale: No hurt  Home Living                                          Prior Functioning/Environment              Frequency  Min 3X/week        Progress Toward Goals  OT Goals(current goals can now be found in the care plan section)  Progress towards OT goals: Progressing toward goals     Plan Discharge plan remains appropriate    Co-evaluation     PT/OT/SLP Co-Evaluation/Treatment: Yes Reason for Co-Treatment: For patient/therapist safety;To address functional/ADL transfers   OT goals addressed during session: ADL's and self-care      AM-PAC PT "6 Clicks" Daily Activity     Outcome Measure   Help from another person eating meals?: A Little Help from another person taking care of personal grooming?: A Little Help from another person toileting, which includes using toliet, bedpan, or urinal?: A Lot Help from another person bathing (including washing, rinsing, drying)?: A Lot Help from another person to put on and taking off regular upper body clothing?: A Little Help from another person to put on and taking off regular lower body clothing?: A Lot 6 Click Score: 15    End of Session Equipment Utilized During Treatment: Gait belt;Rolling walker;Oxygen  OT Visit Diagnosis: Muscle weakness (generalized) (M62.81);Other symptoms and signs involving cognitive function;Unsteadiness on feet (R26.81)   Activity Tolerance Patient tolerated treatment well   Patient Left in chair;with call bell/phone within reach;with family/visitor present   Nurse Communication Mobility status        Time: 1127-1206 OT Time Calculation (min): 39 min  Charges: OT General Charges $OT Visit: 1 Visit OT Treatments $Therapeutic Activity: 8-22 mins  [facility], OTR/L [phone]    [name] 06/21/2017, 12:53 PM

## 2017-06-22 DIAGNOSIS — J189 Pneumonia, unspecified organism: Secondary | ICD-10-CM

## 2017-06-22 LAB — BASIC METABOLIC PANEL
Anion gap: 9 (ref 5–15)
BUN: 7 mg/dL (ref 6–20)
CO2: 35 mmol/L — ABNORMAL HIGH (ref 22–32)
Calcium: 9 mg/dL (ref 8.9–10.3)
Chloride: 92 mmol/L — ABNORMAL LOW (ref 101–111)
Creatinine, Ser: 0.75 mg/dL (ref 0.44–1.00)
GFR calc Af Amer: >60 mL/min (ref >60)
GLUCOSE: 210 mg/dL — AB (ref 65–99)
POTASSIUM: 4 mmol/L (ref 3.5–5.1)
Sodium: 136 mmol/L (ref 135–145)

## 2017-06-22 LAB — GLUCOSE, CAPILLARY
GLUCOSE-CAPILLARY: 149 mg/dL — AB (ref 65–99)
Glucose-Capillary: 142 mg/dL — ABNORMAL HIGH (ref 65–99)
Glucose-Capillary: 275 mg/dL — ABNORMAL HIGH (ref 65–99)
Glucose-Capillary: 240 mg/dL — ABNORMAL HIGH (ref 65–99)

## 2017-06-22 MED ORDER — SALINE SPRAY 0.65 % NA SOLN
1.0000 | NASAL | Status: DC | PRN
  Filled 2017-06-22: qty 44

## 2017-06-22 NOTE — Progress Notes (Signed)
Physical Therapy Treatment Patient Details Name: Daris Aristizabal MRN: [id number] DOB: 03/02/1941 Today's Date: 06/22/2017    History of Present Illness Patient is a 76 yo female admitted 06/06/17 with AMS and seizures.  Patient was intubated to protect airway.  Failed extubation, patient had emergent tracheotomy.   PMH:  anemia, chronic resp failure on 2.5 L O2 at home, depression, anxiety, DM, HTN    PT Comments    Pt continues to progress towards goals and is increasing activity tolerance. Pt able to tolerate prolonged standing this session for pericare. Oxygen sats decreased to low 80s on 28% FiO2 upon seated rest, however, with prolonged rest, cues for deep breathing and removal of passy muir valve, pt sats increased to low 90% on 28% FiO2. Pt with increased steadiness with gait, however, continues to required min to min guard assist for mobility and demonstrates occasional R lateral lean. Required standing rests X3 secondary to SOB. Oxygen sats ranged from 88%-92% during gait with 28% FiO2. Pt continues to be very motivated to participate in therapy sessions and is eager to regain independence. Current recommendations appropriate. Will continue to follow acutely to maximize functional mobility independence and safety.    Follow Up Recommendations  CIR;Supervision/Assistance - 24 hour     Equipment Recommendations  None recommended by PT    Recommendations for Other Services Rehab consult     Precautions / Restrictions Precautions Precautions: Fall Precaution Comments: trach Restrictions Weight Bearing Restrictions: No    Mobility  Bed Mobility               General bed mobility comments: In chair upon entry.   Transfers Overall transfer level: Needs assistance Equipment used: Rolling walker (2 wheeled) Transfers: Sit to/from Stand Sit to Stand: Min assist         General transfer comment: Min A for lift assist and steadying. Verbal cues for safe hand placement.  Able to stand for ~ 5 min for clean up. Sats stable on 28% FiO2, however, upon sitting dropped to lower 80's. Required prolonged seated rest, cues for deep breathing and removal of passy muir valve for sats to return to 90% on 28% before attempting gait.   Ambulation/Gait Ambulation/Gait assistance: Min assist;+2 safety/equipment Ambulation Distance (Feet): 30 Feet Assistive device: Rolling walker (2 wheeled) Gait Pattern/deviations: Step-through pattern;Decreased stride length;Trunk flexed Gait velocity: Decreased Gait velocity interpretation: Below normal speed for age/gender General Gait Details: Slow, unsteady gait, however, improved steadiness from previous session. Occasional R lateral lean during gait and min to min guard A for steadying with chair follow. Pt required standing rests X 3 during gait secondary to SOB. Oxygen sats from 88%-92% on 28% FiO2.    Stairs            Wheelchair Mobility    Modified Rankin (Stroke Patients Only)       Balance Overall balance assessment: Needs assistance;History of Falls Sitting-balance support: Feet supported Sitting balance-Leahy Scale: Fair   Postural control: Right lateral lean Standing balance support: Bilateral upper extremity supported Standing balance-Leahy Scale: Poor Standing balance comment: requires UE support                            Cognition Arousal/Alertness: Awake/alert Behavior During Therapy: WFL for tasks assessed/performed Overall Cognitive Status: Within Functional Limits for tasks assessed  Exercises      General Comments General comments (skin integrity, edema, etc.): Pt's husband present during session.       Pertinent Vitals/Pain Pain Assessment: No/denies pain    Home Living                      Prior Function            PT Goals (current goals can now be found in the care plan section) Acute Rehab PT  Goals PT Goal Formulation: With patient Time For Goal Achievement: 06/30/17 Potential to Achieve Goals: Good Progress towards PT goals: Progressing toward goals    Frequency    Min 3X/week      PT Plan Current plan remains appropriate    Co-evaluation              AM-PAC PT "6 Clicks" Daily Activity  Outcome Measure  Difficulty turning over in bed (including adjusting bedclothes, sheets and blankets)?: A Little Difficulty moving from lying on back to sitting on the side of the bed? : Unable Difficulty sitting down on and standing up from a chair with arms (e.g., wheelchair, bedside commode, etc,.)?: Unable Help needed moving to and from a bed to chair (including a wheelchair)?: A Little Help needed walking in hospital room?: A Little Help needed climbing 3-5 steps with a railing? : Total 6 Click Score: 12    End of Session Equipment Utilized During Treatment: Oxygen;Gait belt Activity Tolerance: Patient tolerated treatment well Patient left: in chair;with call bell/phone within reach;with family/visitor present Nurse Communication: Mobility status PT Visit Diagnosis: Unsteadiness on feet (R26.81);Other abnormalities of gait and mobility (R26.89);Muscle weakness (generalized) (M62.81)     Time: 8032-1224 PT Time Calculation (min) (ACUTE ONLY): 34 min  Charges:  $Gait Training: 8-22 mins $Therapeutic Activity: 8-22 mins                    G Codes:       Leighton Ruff, PT, DPT  Acute Rehabilitation Services  Pager: [phone]    Rudean Hitt 06/22/2017, 1:38 PM

## 2017-06-22 NOTE — Progress Notes (Signed)
PROGRESS NOTE    Amanda Short  IZX:[id number] DOB: [birth year] DOA: 06/06/2017 PCP: System, Pcp Not In   Chief Complaint  Patient presents with  . Altered Mental Status    Brief Narrative:  HPI on 06/06/2017 by Dr. Jennet Maduro (PCCM) Pt is encephelopathic; therefore, this HPI is obtained from chart review. Amanda Short is a 76 y.o. female with PMH as outlined below.  She was brought to [facility] ED 11/21 with AMS.  She was last seen normal 4 - 5 hours prior.  Initially called as code stroke. En route to ED, had seizure for which she was given 2.68m of versed.  In ED, had recurrent CT after returning from CT scan.  Received 66mativan and later had decrease in mental status / post ictal state where she was not protecting airway. She was subsequently intubated and PCCM was asked to admit to the ICU.  Neurology consulted by EDP. No known hx of seizures.  Interim history Patient received trach and transferred to stepdown. TRH assumed care on 06/15/2017. Patient improving. Possibly CIR candidate.  Assessment & Plan   Status epilepticus, new seizure disorder -Patient presented with seizure and was intubated for airway protection -Neurology consulted and appreciated, suspected low magnesium at to be a possible precipitant  -She was placed on Keppra 750 mg BID, will continue -results of EEG below. -No further seizures reported. -Neuro f/u as outpatient  Acute on chronic hypoxic respiratory fialure -suspected retropharyngeal edema/vocal cord edema s/p emergent trach on 06/12/17 after failing extubation attempt -PCCM continues to follow, plan for change to downsize to 4 cuffless trach this week -Speech therapy consulted to initiate passy muir trials -Per husband, patient uses 2.5 L of oxygen at home - Plan is for patient's husband to bring patient's cpap from home. If patient does not desat overnight CIR to accept patient  Fall -patient fell yesterday evening/last night -CT  head/cervical spine: No acute cranial process. Moderate cerebral atrophy with chronic small vessel ischemic disease. No acute rheumatic injury within the cervical spine. 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 -Patient currently stable, will continue to monitor closely  Chronic Anemia -Possibly gastritis versus GI bleed -Hemoglobin dropped to 7.2, received no blood transfusions -last hemoglobin 9.2 (baseline 10-11) -Patient had a fairly traumatic tracheostomy placement with some blood loss and has been on heparin -Currently no evidence of bleeding -Continue to monitor CBC  Right lower lobe atelecatsis vs aspiration pneumonia -was placed on vancomycin (discontinued on 11/30) and Zosyn (discontinued 12/3)  Hypomagnesemia -Magnesium 2 after replacement on last check -pending labs today  Hypocalcemia -corrected calcium WNL -continue to monitor   Hypokalemia -placed order for bmp. If K low will replace  Acute encephalopathy -Resolved  Depression/anxiety -Continue cymbalta and ativan  OSA -Continue trach  History of DVT/PE -Currently on heparin -Restarted on xarelto  Protein calorie malnutrition -was on tube feeds, however, patient pulled out her NG tube -Speech therapy consulted, now on dysphagia 3 diet  Diabetes mellitus, type II -Continue insulin sliding scale with CBG monitoring   Essential hypertension - Stable will continue metoprolol, spironolactone  Hypothyroidism - Continue Synthroid  Physical deconditioning - PT/OT consulted, recommended CIR - CIR consulted and patient potential candidate  DVT Prophylaxis  heparin  Code Status: Full  Family Communication: Husband at bedside  Disposition Plan:  dispo pending- CIR consulted  Consultants PCCM Neurology  Procedures  CT HEAD W/O 11/21:  No acute cortically based infarct or intracranial hemorrhage identified. ASPECTS is 10.  Increased chronic  nonspecific cerebral volume loss since  2014. 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 PORT CXR 11/22:  Previously reviewed by me. Persistent silhouetting left hemidiaphragm suggestive of opacification versus consolidation. Endotracheal tube in good position. Enteric feeding tube coursing below diaphragm. EEG 11/23: This EEG is indicative of a severe diffuse encephalopathy, non-specific as to etiology. Sedating medication effect cannot be excluded. No epileptiform discharges, seizures, focal or lateralizing signs are seen.Compared to previous epoch, there is minimal improvement in the degree of encephalopathy. 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 Sedating medication effectis strongly suspected as the main driver. No epileptiform discharges, seizures, focal or lateralizing signs are seen. MRI BRAIN W/O 11/24: IMPRESSION: 1. No acute abnormality. 2. Chronic ischemic microangiopathy. 3. Advanced cerebral volume loss that is asymmetrically worse on the left to a mild degree.  Antibiotics   Anti-infectives (From admission, onward)   Start     Dose/Rate Route Frequency Ordered Stop   06/13/17 0100  vancomycin (VANCOCIN) IVPB 750 mg/150 ml premix  Status:  Discontinued     750 mg 150 mL/hr over 60 Minutes Intravenous Every 12 hours 06/12/17 1335 06/15/17 1022   06/12/17 1200  piperacillin-tazobactam (ZOSYN) IVPB 3.375 g  Status:  Discontinued     3.375 g 12.5 mL/hr over 240 Minutes Intravenous Every 8 hours 06/12/17 1118 06/19/17 1128   06/12/17 1130  vancomycin (VANCOCIN) 1,500 mg in sodium chloride 0.9 % 500 mL IVPB     1,500 mg 250 mL/hr over 120 Minutes Intravenous  Once 06/12/17 1118 06/12/17 1351      Subjective:   Amanda Short  Pt has no new complaints.  Objective:   Vitals:   06/22/17 0758 06/22/17 0809 06/22/17 1142 06/22/17 1238  BP:      Pulse: 61   62  Resp: 13   15  Temp:  99 F (37.2 C) 98.8  F (37.1 C)   TempSrc:  Oral Oral   SpO2: 98%   93%  Weight:      Height:       No intake or output data in the 24 hours ending 06/22/17 1309 Filed Weights   06/20/17 0308 06/21/17 0315 06/22/17 0344  Weight: 97.4 kg (214 lb 11.7 oz) 96 kg (211 lb 10.3 oz) 95.9 kg (211 lb 6.7 oz)   Exam  General: Well developed, well nourished, NAD, alert and awake  HEENT: NCAT, mucous membranes moist.   Neck: Trach, no goiter  Cardiovascular: S1 S2 auscultated, no murmurs, RRR  Respiratory: Clear to auscultation bilaterally with equal chest rise,   Abdomen: Soft, nontender, nondistended, + bowel sounds  Extremities: warm dry without cyanosis clubbing or edema  Neuro: AAOx3, nonfocal  Psych: Appropriate mood and affect  Data Reviewed: I have personally reviewed following labs and imaging studies  CBC: Recent Labs  Lab 06/16/17 1049 06/17/17 0353 06/18/17 1209 06/19/17 1055 06/20/17 0300  WBC 9.0 9.2 10.5 12.1* 8.6  HGB 9.0* 9.8* 9.2* 9.8* 9.4*  HCT 29.5* 32.1* 29.7* 32.1* 30.2*  MCV 101.0* 101.9* 100.0 100.0 100.3*  PLT 367 418* 444* 429* 098   Basic Metabolic Panel: Recent Labs  Lab 06/16/17 1049 06/17/17 0353 06/18/17 1209 06/19/17 1055 06/20/17 0300  NA 142 139 140 141 138  K 3.7 3.8 3.6 3.4* 3.0*  CL 101 100* 100* 93* 89*  CO2 32 27 33* 36* 38*  GLUCOSE 138* 136* 195* 149* 148*  BUN _0 5* <5*  CREATININE 0.69 0.75 0.75 0.81 0.70  CALCIUM 8.6* 8.4* 8.7* 8.4* 8.2*  MG 1.2* 2.0  --  1.3* 2.1   GFR: Estimated Creatinine Clearance: 63.3 mL/min (by C-G formula based on SCr of 0.7 mg/dL). Liver Function Tests: Recent Labs  Lab 06/16/17 1049  AST 12*  ALT 22  ALKPHOS 79  BILITOT 0.8  PROT 4.9*  ALBUMIN 2.1*   No results for input(s): LIPASE, AMYLASE in the last 168 hours. No results for input(s): AMMONIA in the last 168 hours. Coagulation Profile: No results for input(s): INR, PROTIME in the last 168 hours. Cardiac Enzymes: No results for input(s):  CKTOTAL, CKMB, CKMBINDEX, TROPONINI in the last 168 hours. BNP (last 3 results) No results for input(s): PROBNP in the last 8760 hours. HbA1C: No results for input(s): HGBA1C in the last 72 hours. CBG: Recent Labs  Lab 06/21/17 1141 06/21/17 1615 06/21/17 2102 06/22/17 0806 06/22/17 1139  GLUCAP 197* 147* 164* 142* 275*   Lipid Profile: No results for input(s): CHOL, HDL, LDLCALC, TRIG, CHOLHDL, LDLDIRECT in the last 72 hours. Thyroid Function Tests: No results for input(s): TSH, T4TOTAL, FREET4, T3FREE, THYROIDAB in the last 72 hours. Anemia Panel: No results for input(s): VITAMINB12, FOLATE, FERRITIN, TIBC, IRON, RETICCTPCT in the last 72 hours. Urine analysis:    Component Value Date/Time   COLORURINE STRAW (A) 06/06/2017 1547   APPEARANCEUR CLEAR 06/06/2017 1547   LABSPEC 1.006 06/06/2017 1547   PHURINE 6.0 06/06/2017 1547   GLUCOSEU NEGATIVE 06/06/2017 1547   HGBUR NEGATIVE 06/06/2017 1547   BILIRUBINUR NEGATIVE 06/06/2017 1547   KETONESUR NEGATIVE 06/06/2017 1547   PROTEINUR 30 (A) 06/06/2017 1547   UROBILINOGEN 0.2 03/31/2013 2006   NITRITE NEGATIVE 06/06/2017 1547   LEUKOCYTESUR NEGATIVE 06/06/2017 1547   Sepsis Labs: _0 (procalcitonin:4,lacticidven:4)  ) Recent Results (from the past 240 hour(s))  Culture, respiratory (NON-Expectorated)     Status: None   Collection Time: 06/13/17 12:12 PM  Result Value Ref Range Status   Specimen Description TRACHEAL ASPIRATE  Final   Special Requests NONE  Final   Gram Stain   Final    RARE WBC PRESENT, PREDOMINANTLY PMN RARE GRAM POSITIVE COCCI IN PAIRS    Culture RARE STREPTOCOCCUS GROUP F  Final   Report Status 06/15/2017 FINAL  Final      Radiology Studies: No results found.   Scheduled Meds: . albuterol  2.5 mg Nebulization Q6H  . chlorhexidine  15 mL Mouth Rinse BID  . DULoxetine  60 mg Oral Daily  . feeding supplement (GLUCERNA SHAKE)  237 mL Oral BID BM  . insulin aspart  0-9 Units  Subcutaneous TID WC  . levothyroxine  100 mcg Oral QAC breakfast  . mouth rinse  15 mL Mouth Rinse q12n4p  . metoprolol succinate  100 mg Oral Daily  . pantoprazole  40 mg Oral Daily  . rivaroxaban  20 mg Oral Q supper  . spironolactone  50 mg Oral Daily   Continuous Infusions: . levETIRAcetam Stopped (06/22/17 0328)     LOS: 16 days   Time Spent in minutes   30 minutes  Efstathios Sawin D.O. on 06/22/2017 at 1:09 PM  Between 7am to 7pm - Pager - [phone]  After 7pm go to [email or web address] - password [email or web address]  And look for the night coverage person covering for me after hours  [facility] Office  [phone]

## 2017-06-22 NOTE — Progress Notes (Signed)
Inpatient Rehabilitation  Continuing to follow for timing of medical readiness and IP Rehab bed availability, as I have received insurance authorization from [facility].  Note that yesterday during the day the patient had FiO2 needs at 28%-40%; however, required up to 60% at night. Discussed with acute MD and nurse case manager that night time saturation needs to be addressed and stabilized prior to IP Rehab admission.  Team is hopeful that patient will be ready for admission tomorrow, 06/23/17.  I will prepare for potential admission Saturday 06/23/17 pending medical clearance by Dr. Letta Pate.  Call if questions.   Carmelia Roller., CCC/SLP Admission Coordinator  [place]  Cell [phone]

## 2017-06-22 NOTE — Progress Notes (Signed)
Pt with continued oxygen desaturations to 83%-88% noted when patient sleeping on 40% Fio2 with slow recovery to saturations back >90% upon waking.  O2 increased to 60% Fio2. Pt currently with oxygen saturation at 100% on 60% Fio2. RT notified and at bedside to evaluate.  Pt denies any SOB, worsening cough or tachypnea.

## 2017-06-22 NOTE — PMR Pre-admission (Signed)
PMR Admission Coordinator Pre-Admission Assessment  Patient: Amanda Short is an 76 y.o., female MRN: [id number] DOB: [birth year] Height: _0  (154.9 cm) Weight: 95.9 kg (211 lb 6.7 oz)              Insurance Information HMO:     PPO: X     PCP:      IPA:      80/20:      OTHER:  PRIMARY: U[employer] Medicare       Policy#: [id number]      Subscriber: Self CM Name: Vevelyn Royals      Phone#: [phone]     Fax#: [phone] Pre-Cert#: [id number] given by Orvan July on 06/21/17 good for 7 days       Employer: Retired  Benefits:  Phone #: [phone]     Name: Verified online [email or web address] Eff. Date: 07/17/16     Deduct: $0      Out of Pocket Max: $4000      Life Max: N/A CIR: $160 a day, days 1-10; $0 a day, days 11+      SNF: $0 a day, days 1-20; $50 a day, days 21-100 Outpatient: PT/OT/SLP     Co-Pay: $20 per visit  Home Health: 100%      Co-Pay: none DME: 80%     Co-Pay: 20% Providers: In-network   SECONDARY: None      Policy#:       Subscriber:  CM Name:       Phone#:      Fax#:  Pre-Cert#:       Employer:  Benefits:  Phone #:      Name:  Eff. Date:      Deduct:       Out of Pocket Max:       Life Max:  CIR:       SNF:  Outpatient:      Co-Pay:  Home Health:       Co-Pay:  DME:      Co-Pay:   Medicaid Application Date:       Case Manager:  Disability Application Date:       Case Worker:   Emergency Contact Information Contact Information    Name Relation Home Work Mobile   Delong,Sid Spouse   [phone]     Current Medical History  Patient Admitting Diagnosis: Debility  History of Present Illness: Amanda Short a 76 y.o.femalewith history of HTN, T2DM, h/o PE, recent bout with diverticulitis 11/11to 05/30/17; who was admitted on 06/06/17 with AMS with weakness left side and left gaze deviation. Stat CT negativereviewed, unremarkable for acute process. Seizure activity in Xray with difficulty protecting airway and was intubated. She was loaded with keppra and required  multiple doses IV ativan due to status epilepticus. Low Ca/Mg levels supplemented and LTM EEG showed evidence of severe diffuse encephalopathy. Hospital course significant for agitation requiring precedex as well as restraints, inability to tolerate extubation requiring emergent tracheostomy at bedside due to stridor/marked swelling of VC on 06/12/17. She was stated on IV antibiotics for aspiration PNA and has been weaned to ATC.   Swallow evaluation done and she was started on regular diet with recommendations to downgrade to thickened liquids in case of respiratory decline. She has been tolerating regular diet but continues to have desaturations at nights. Trach downsizing was held due to higher oxygen needs of 60% Fio2. 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  She continues to have cognitive deficits with decreased balance and hypoxia affecting activity. CIR recommended due to functional deficits.      Past Medical History  Past Medical History:  Diagnosis Date  . Arthritis    "back, hands" (07/22/2015)  . Cancer of left breast ([place]) 2006   S/P lumpectomy  . Complication of anesthesia    "brief breathing problem at surgery center in ~ 2005 when I had gallbladder OR"  . Depression   . DVT (deep venous thrombosis) ([place]) 03/2013   LLE  . GERD (gastroesophageal reflux disease)   . Hyperlipidemia   . Hypertension   . Hypothyroidism   . OSA treated with BiPAP   . Pulmonary embolism ([place]) 03/2013  . Thyroid disease    Hypothyroid  . Type II diabetes mellitus ([facility])     Family History  family history includes Diabetes in her unknown relative; Diabetes Mellitus II in her sister.  Prior Rehab/Hospitalizations:  Has the patient had major surgery during 100 days prior to admission? No  Current Medications   Current Facility-Administered Medications:  .  acetaminophen (TYLENOL) tablet 650 mg, 650  mg, Oral, Q4H PRN, Wynell Balloon, RPH, 650 mg at 06/22/17 1324 .  albuterol (PROVENTIL) (2.5 MG/3ML) 0.083% nebulizer solution 2.5 mg, 2.5 mg, Nebulization, Q6H, Erick Colace, NP, 2.5 mg at 06/22/17 2010 .  albuterol (PROVENTIL) (2.5 MG/3ML) 0.083% nebulizer solution 2.5 mg, 2.5 mg, Nebulization, Q4H PRN, Cristal Ford, DO, 2.5 mg at 06/20/17 1122 .  bisacodyl (DULCOLAX) suppository 10 mg, 10 mg, Rectal, Daily PRN, Jennelle Human B, NP .  chlorhexidine (PERIDEX) 0.12 % solution 15 mL, 15 mL, Mouth Rinse, BID, Rush Farmer, MD, 15 mL at 06/22/17 2116 .  docusate (COLACE) 50 MG/5ML liquid 100 mg, 100 mg, Per Tube, BID PRN, Jennelle Human B, NP, 100 mg at 06/15/17 0446 .  DULoxetine (CYMBALTA) DR capsule 60 mg, 60 mg, Oral, Daily, Cristal Ford, DO, 60 mg at 06/22/17 0945 .  feeding supplement (GLUCERNA SHAKE) (GLUCERNA SHAKE) liquid 237 mL, 237 mL, Oral, BID BM, Velvet Bathe, MD, 237 mL at 06/22/17 0950 .  fentaNYL (SUBLIMAZE) injection 50 mcg, 50 mcg, Intravenous, Q15 min PRN, Erick Colace, NP .  fentaNYL (SUBLIMAZE) injection 50 mcg, 50 mcg, Intravenous, Q2H PRN, Erick Colace, NP, 50 mcg at 06/21/17 0039 .  hydrocortisone cream 1 %, , Topical, TID PRN, Jani Gravel, MD .  insulin aspart (novoLOG) injection 0-9 Units, 0-9 Units, Subcutaneous, TID WC, Ree Kida Datto, DO, 3 Units at 06/22/17 1610 .  levETIRAcetam (KEPPRA) 750 mg in sodium chloride 0.9 % 100 mL IVPB, 750 mg, Intravenous, Q12H, Kerney Elbe, MD, Stopped at 06/22/17 1713 .  levothyroxine (SYNTHROID, LEVOTHROID) tablet 100 mcg, 100 mcg, Oral, QAC breakfast, Rush Farmer, MD, 100 mcg at 06/22/17 0944 .  LORazepam (ATIVAN) injection 0.5-1 mg, 0.5-1 mg, Intravenous, Q4H PRN, Erick Colace, NP, 0.5 mg at 06/22/17 2032 .  MEDLINE mouth rinse, 15 mL, Mouth Rinse, q12n4p, Rush Farmer, MD, 15 mL at 06/22/17 1242 .  metoprolol succinate (TOPROL-XL) 24 hr tablet 100 mg, 100 mg, Oral, Daily, Mannam, Praveen, MD,  100 mg at 06/22/17 0945 .  pantoprazole (PROTONIX) EC tablet 40 mg, 40 mg, Oral, Daily, Cristal Ford, DO, 40 mg at 06/22/17 0944 .  rivaroxaban (XARELTO) tablet 20 mg, 20 mg, Oral, Q supper, Cristal Ford, DO, 20 mg at 06/22/17 1629 .  sodium chloride (OCEAN) 0.65 % nasal spray 1 spray,  1 spray, Each Nare, PRN, Blount, Xenia T, NP .  sodium chloride flush (NS) 0.9 % injection 10-40 mL, 10-40 mL, Intracatheter, PRN, Rush Farmer, MD .  spironolactone (ALDACTONE) tablet 50 mg, 50 mg, Oral, Daily, Mannam, Praveen, MD, 50 mg at 06/22/17 0944  Patients Current Diet: Diet Carb Modified Fluid consistency: Thin; Room service appropriate? Yes  Precautions / Restrictions Precautions Precautions: Fall Precaution Comments: trach Restrictions Weight Bearing Restrictions: No   Has the patient had 2 or more falls or a fall with injury in the past year?Yes  Prior Activity Level Community (5-7x/wk): Prior to this admission patient had recently returned home from a stay at [facility] and was using a rolling walker. However, prior to that hospitalization patient was fully independent, enjoyed shopping, visiting with friends, and playing with her cats.    Home Assistive Devices / Equipment Home Assistive Devices/Equipment: Environmental consultant (specify type), Bedside commode/3-in-1, Built-in shower seat(front wheel walker) Home Equipment: Walker - 2 wheels, Shower seat, Cane - single point, Bedside commode  Prior Device Use: Indicate devices/aids used by the patient prior to current illness, exacerbation or injury? Walker for a short time prior to this admission   Prior Functional Level Prior Function Level of Independence: Independent  Self Care: Did the patient need help bathing, dressing, using the toilet or eating? Independent  Indoor Mobility: Did the patient need assistance with walking from room to room (with or without device)? Independent  Stairs: Did the patient need assistance with internal or external  stairs (with or without device)? Independent  Functional Cognition: Did the patient need help planning regular tasks such as shopping or remembering to take medications? Independent  Current Functional Level Cognition  Overall Cognitive Status: Within Functional Limits for tasks assessed Difficult to assess due to: Tracheostomy Current Attention Level: Selective Orientation Level: Oriented X4 Following Commands: Follows one step commands consistently, Follows multi-step commands inconsistently Safety/Judgement: Decreased awareness of safety General Comments: Difficulty with processing simple commands.     Extremity Assessment (includes Sensation/Coordination)  Upper Extremity Assessment: Generalized weakness  Lower Extremity Assessment: Generalized weakness    ADLs  Overall ADL's : Needs assistance/impaired Eating/Feeding: Supervision/ safety, Sitting Grooming: Supervision/safety, Sitting Upper Body Bathing: Minimal assistance, Sitting Lower Body Bathing: Moderate assistance, Sit to/from stand Upper Body Dressing : Minimal assistance, Sitting Lower Body Dressing: Sit to/from stand, Maximal assistance Lower Body Dressing Details (indicate cue type and reason): Pt able to don Lt sock with mod A, max A for Rt sock  Toilet Transfer: Minimal assistance, +2 for physical assistance, Ambulation, Comfort height toilet, Regular Toilet, Grab bars, RW Toilet Transfer Details (indicate cue type and reason): Min assist for sit<>stand with min assist and march in place. Sitting impulsively and unable to progress beyond marching in place.  Toileting- Clothing Manipulation and Hygiene: Maximal assistance, Sit to/from stand Functional mobility during ADLs: Minimal assistance, +2 for physical assistance, Rolling walker General ADL Comments: Pt with very poor activity tolerance for ADL. She requires cues for processing and to follow commands well.     Mobility  Overal bed mobility: Needs  Assistance Bed Mobility: Sit to Supine Rolling: Min guard Supine to sit: Min assist, HOB elevated Sit to supine: Min guard General bed mobility comments: In chair upon entry.     Transfers  Overall transfer level: Needs assistance Equipment used: Rolling walker (2 wheeled) Transfers: Sit to/from Stand Sit to Stand: Min assist Stand pivot transfers: Min assist, +2 safety/equipment General transfer comment: Min A for lift assist and steadying.  Verbal cues for safe hand placement. Able to stand for ~ 5 min for clean up. Sats stable on 28% FiO2, however, upon sitting dropped to lower 80's. Required prolonged seated rest, cues for deep breathing and removal of passy muir valve for sats to return to 90% on 28% before attempting gait.     Ambulation / Gait / Stairs / Wheelchair Mobility  Ambulation/Gait Ambulation/Gait assistance: Min assist, +2 safety/equipment Ambulation Distance (Feet): 30 Feet Assistive device: Rolling walker (2 wheeled) Gait Pattern/deviations: Step-through pattern, Decreased stride length, Trunk flexed General Gait Details: Slow, unsteady gait, however, improved steadiness from previous session. Occasional R lateral lean during gait and min to min guard A for steadying with chair follow. Pt required standing rests X 3 during gait secondary to SOB. Oxygen sats from 88%-92% on 28% FiO2.  Gait velocity: Decreased Gait velocity interpretation: Below normal speed for age/gender    Posture / Balance Dynamic Sitting Balance Sitting balance - Comments: Pt initially required min A for sitting balance EOB due to LOB posteriorly.  However, she progressed to close min gaurd assist.  She required min A to maintain balance while leaning forward to access feet to don socks  Balance Overall balance assessment: Needs assistance, History of Falls Sitting-balance support: Feet supported Sitting balance-Leahy Scale: Fair Sitting balance - Comments: Pt initially required min A for sitting  balance EOB due to LOB posteriorly.  However, she progressed to close min gaurd assist.  She required min A to maintain balance while leaning forward to access feet to don socks  Postural control: Right lateral lean Standing balance support: Bilateral upper extremity supported Standing balance-Leahy Scale: Poor Standing balance comment: requires UE support    Special needs/care consideration BiPAP/CPAP: Yes, CPCP at home prior to admission CPM: No Continuous Drip IV: No Dialysis: No         Life Vest: No Oxygen: Yes, 28% FiO2 via trach collar and rest breaks with therapy  Special Bed: No Trach Size: 6 cuffed Shiley trach with PMSV with full supervision  Wound Vac (area): No       Skin: Bruising to left arm and hip                                Bowel mgmt: Continent, last BM 06/20/17 Bladder mgmt:Intermittent incontinence  Diabetic mgmt: Yes      Previous Home Environment Living Arrangements: Spouse/significant other Available Help at Discharge: Family, Available 24 hours/day Type of Home: House Home Layout: One level Home Access: Level entry Bathroom Shower/Tub: Chiropodist: Standard Home Care Services: No  Discharge Living Setting Plans for Discharge Living Setting: Patient's home, Lives with (comment)(Spouse ) Type of Home at Discharge: House Discharge Home Layout: One level Discharge Home Access: Level entry Discharge Bathroom Shower/Tub: Tub/shower unit, Curtain Discharge Bathroom Toilet: Standard Discharge Bathroom Accessibility: Yes How Accessible: Accessible via walker(tight fit ) Does the patient have any problems obtaining your medications?: No  Social/Family/Support Systems Patient Roles: Spouse Contact Information: Clora Ohmer Cell: [phone] Anticipated Caregiver: Spouse Anticipated Caregiver's Contact Information: see above  Ability/Limitations of Caregiver: None, spouse would like patient decannulated  Caregiver Availability:  24/7 Discharge Plan Discussed with Primary Caregiver: Yes Is Caregiver In Agreement with Plan?: Yes Does Caregiver/Family have Issues with Lodging/Transportation while Pt is in Rehab?: No  Goals/Additional Needs Patient/Family Goal for Rehab: PT/OT/SLP: Supervision  Expected length of stay: 14-18 days  Cultural Considerations: [facility]  Dietary  Needs: Carb. Mod. diet restrictions  Equipment Needs: TBD Special Service Needs: Patient trailing home CPAP 06/22/17  Pt/Family Agrees to Admission and willing to participate: Yes Program Orientation Provided & Reviewed with Pt/Caregiver Including Roles  & Responsibilities: Yes Additional Information Needs: Would like to be decannulated prior to discharge  Information Needs to be Provided By: Team FYI  Barriers to Discharge: Trach  Decrease burden of Care through IP rehab admission: No  Possible need for SNF placement upon discharge: No  Patient Condition: This patient's medical and functional status has changed since the consult dated: 06/18/17 in which the Rehabilitation Physician determined and documented that the patient's condition is appropriate for intensive rehabilitative care in an inpatient rehabilitation facility. See "History of Present Illness" (above) for medical update. Functional changes are: Min A transfers and Min A +2 gait for 30 feet. Patient's medical and functional status update has been discussed with the Rehabilitation physician and patient remains appropriate for inpatient rehabilitation. Will admit to inpatient rehab tomorrow.  Preadmission Screen Completed By:  Gunnar Fusi, 06/22/2017 10:26 PM ______________________________________________________________________   Discussed status with Dr. Letta Pate on 06/22/17 at 43 and received telephone approval for admission tomorrow.  Admission Coordinator:  Gunnar Fusi, time 1600/Date 06/22/17

## 2017-06-23 ENCOUNTER — Other Ambulatory Visit: Payer: Self-pay

## 2017-06-23 ENCOUNTER — Encounter (HOSPITAL_COMMUNITY): Payer: Self-pay

## 2017-06-23 ENCOUNTER — Inpatient Hospital Stay (HOSPITAL_COMMUNITY)
Admission: RE | Admit: 2017-06-23 | Discharge: 2017-07-05 | DRG: 945 | Disposition: A | Discharge status: Home Health | Payer: Medicare Other | Source: Intra-hospital | Attending: Physical Medicine & Rehabilitation | Admitting: Physical Medicine & Rehabilitation

## 2017-06-23 DIAGNOSIS — F329 Major depressive disorder, single episode, unspecified: Secondary | ICD-10-CM | POA: Diagnosis present

## 2017-06-23 DIAGNOSIS — Z79899 Other long term (current) drug therapy: Secondary | ICD-10-CM

## 2017-06-23 DIAGNOSIS — H518 Other specified disorders of binocular movement: Secondary | ICD-10-CM | POA: Diagnosis present

## 2017-06-23 DIAGNOSIS — Z9981 Dependence on supplemental oxygen: Secondary | ICD-10-CM

## 2017-06-23 DIAGNOSIS — K219 Gastro-esophageal reflux disease without esophagitis: Secondary | ICD-10-CM | POA: Diagnosis present

## 2017-06-23 DIAGNOSIS — F419 Anxiety disorder, unspecified: Secondary | ICD-10-CM | POA: Diagnosis present

## 2017-06-23 DIAGNOSIS — Z96642 Presence of left artificial hip joint: Secondary | ICD-10-CM | POA: Diagnosis present

## 2017-06-23 DIAGNOSIS — J690 Pneumonitis due to inhalation of food and vomit: Secondary | ICD-10-CM | POA: Diagnosis present

## 2017-06-23 DIAGNOSIS — Z79890 Hormone replacement therapy (postmenopausal): Secondary | ICD-10-CM

## 2017-06-23 DIAGNOSIS — I272 Pulmonary hypertension, unspecified: Secondary | ICD-10-CM

## 2017-06-23 DIAGNOSIS — E785 Hyperlipidemia, unspecified: Secondary | ICD-10-CM | POA: Diagnosis present

## 2017-06-23 DIAGNOSIS — R4189 Other symptoms and signs involving cognitive functions and awareness: Secondary | ICD-10-CM | POA: Diagnosis present

## 2017-06-23 DIAGNOSIS — I10 Essential (primary) hypertension: Secondary | ICD-10-CM | POA: Diagnosis present

## 2017-06-23 DIAGNOSIS — Z96653 Presence of artificial knee joint, bilateral: Secondary | ICD-10-CM | POA: Diagnosis present

## 2017-06-23 DIAGNOSIS — G4733 Obstructive sleep apnea (adult) (pediatric): Secondary | ICD-10-CM | POA: Diagnosis present

## 2017-06-23 DIAGNOSIS — B961 Klebsiella pneumoniae [K. pneumoniae] as the cause of diseases classified elsewhere: Secondary | ICD-10-CM | POA: Diagnosis present

## 2017-06-23 DIAGNOSIS — E119 Type 2 diabetes mellitus without complications: Secondary | ICD-10-CM | POA: Diagnosis present

## 2017-06-23 DIAGNOSIS — Z9911 Dependence on respirator [ventilator] status: Secondary | ICD-10-CM

## 2017-06-23 DIAGNOSIS — E039 Hypothyroidism, unspecified: Secondary | ICD-10-CM | POA: Diagnosis present

## 2017-06-23 DIAGNOSIS — F411 Generalized anxiety disorder: Secondary | ICD-10-CM | POA: Diagnosis present

## 2017-06-23 DIAGNOSIS — Z853 Personal history of malignant neoplasm of breast: Secondary | ICD-10-CM | POA: Diagnosis not present

## 2017-06-23 DIAGNOSIS — R5381 Other malaise: Principal | ICD-10-CM | POA: Diagnosis present

## 2017-06-23 DIAGNOSIS — Z86718 Personal history of other venous thrombosis and embolism: Secondary | ICD-10-CM | POA: Diagnosis not present

## 2017-06-23 DIAGNOSIS — Z86711 Personal history of pulmonary embolism: Secondary | ICD-10-CM | POA: Diagnosis not present

## 2017-06-23 DIAGNOSIS — Z87891 Personal history of nicotine dependence: Secondary | ICD-10-CM

## 2017-06-23 DIAGNOSIS — Z90710 Acquired absence of both cervix and uterus: Secondary | ICD-10-CM

## 2017-06-23 DIAGNOSIS — Z930 Tracheostomy status: Secondary | ICD-10-CM

## 2017-06-23 DIAGNOSIS — Z8709 Personal history of other diseases of the respiratory system: Secondary | ICD-10-CM

## 2017-06-23 DIAGNOSIS — R059 Cough, unspecified: Secondary | ICD-10-CM

## 2017-06-23 DIAGNOSIS — R4182 Altered mental status, unspecified: Secondary | ICD-10-CM | POA: Diagnosis present

## 2017-06-23 DIAGNOSIS — Z7984 Long term (current) use of oral hypoglycemic drugs: Secondary | ICD-10-CM

## 2017-06-23 DIAGNOSIS — Z888 Allergy status to other drugs, medicaments and biological substances status: Secondary | ICD-10-CM

## 2017-06-23 DIAGNOSIS — G40909 Epilepsy, unspecified, not intractable, without status epilepticus: Secondary | ICD-10-CM

## 2017-06-23 DIAGNOSIS — R05 Cough: Secondary | ICD-10-CM

## 2017-06-23 DIAGNOSIS — Z7901 Long term (current) use of anticoagulants: Secondary | ICD-10-CM

## 2017-06-23 DIAGNOSIS — Z833 Family history of diabetes mellitus: Secondary | ICD-10-CM | POA: Diagnosis not present

## 2017-06-23 DIAGNOSIS — G40901 Epilepsy, unspecified, not intractable, with status epilepticus: Secondary | ICD-10-CM

## 2017-06-23 HISTORY — DX: Other malaise: R53.81

## 2017-06-23 HISTORY — DX: Acquired absence of both cervix and uterus: Z90.710

## 2017-06-23 HISTORY — DX: Concussion with loss of consciousness of unspecified duration, initial encounter: S06.0X9A

## 2017-06-23 LAB — GLUCOSE, CAPILLARY
GLUCOSE-CAPILLARY: 141 mg/dL — AB (ref 65–99)
GLUCOSE-CAPILLARY: 127 mg/dL — AB (ref 65–99)
GLUCOSE-CAPILLARY: 191 mg/dL — AB (ref 65–99)
Glucose-Capillary: 227 mg/dL — ABNORMAL HIGH (ref 65–99)

## 2017-06-23 MED ORDER — SPIRONOLACTONE 25 MG PO TABS
50.0000 mg | ORAL_TABLET | Freq: Every day | ORAL | Status: DC
  Administered 2017-06-24 – 2017-07-05 (×12): 50 mg via ORAL
  Filled 2017-06-23 (×12): qty 2

## 2017-06-23 MED ORDER — SPIRONOLACTONE 50 MG PO TABS: 50.0000 mg | ORAL_TABLET | Freq: Every day | ORAL | Status: DC

## 2017-06-23 MED ORDER — LORAZEPAM 2 MG/ML IJ SOLN
0.5000 mg | INTRAMUSCULAR | Status: DC | PRN
  Administered 2017-06-23: 0.5 mg via INTRAVENOUS
  Filled 2017-06-23: qty 1

## 2017-06-23 MED ORDER — DIPHENHYDRAMINE HCL 12.5 MG/5ML PO ELIX
12.5000 mg | ORAL_SOLUTION | Freq: Four times a day (QID) | ORAL | Status: DC | PRN
  Administered 2017-06-23 – 2017-06-24 (×2): 25 mg via ORAL
  Filled 2017-06-23 (×2): qty 10

## 2017-06-23 MED ORDER — ACETAMINOPHEN 325 MG PO TABS
325.0000 mg | ORAL_TABLET | ORAL | Status: DC | PRN
  Administered 2017-06-25 – 2017-07-03 (×7): 650 mg via ORAL
  Filled 2017-06-23 (×7): qty 2

## 2017-06-23 MED ORDER — BISACODYL 10 MG RE SUPP: 10.0000 mg | Freq: Every day | RECTAL | Status: DC | PRN

## 2017-06-23 MED ORDER — GERHARDT'S BUTT CREAM
TOPICAL_CREAM | Freq: Three times a day (TID) | CUTANEOUS | Status: DC
  Administered 2017-06-23: 13:00:00 via TOPICAL
  Filled 2017-06-23: qty 1

## 2017-06-23 MED ORDER — LORAZEPAM 0.5 MG PO TABS
0.5000 mg | ORAL_TABLET | ORAL | Status: DC | PRN
  Administered 2017-06-24 – 2017-06-27 (×6): 0.5 mg via ORAL
  Filled 2017-06-23 (×7): qty 1

## 2017-06-23 MED ORDER — INSULIN ASPART 100 UNIT/ML ~~LOC~~ SOLN: 0.0000 [IU] | Freq: Three times a day (TID) | SUBCUTANEOUS | Status: DC

## 2017-06-23 MED ORDER — ALBUTEROL SULFATE (2.5 MG/3ML) 0.083% IN NEBU
2.5000 mg | INHALATION_SOLUTION | Freq: Three times a day (TID) | RESPIRATORY_TRACT | Status: DC
  Administered 2017-06-23: 2.5 mg via RESPIRATORY_TRACT
  Filled 2017-06-23: qty 3

## 2017-06-23 MED ORDER — RIVAROXABAN 20 MG PO TABS
20.0000 mg | ORAL_TABLET | Freq: Every day | ORAL | Status: DC
  Administered 2017-06-23 – 2017-07-04 (×12): 20 mg via ORAL
  Filled 2017-06-23 (×12): qty 1

## 2017-06-23 MED ORDER — PANTOPRAZOLE SODIUM 40 MG PO TBEC
40.0000 mg | DELAYED_RELEASE_TABLET | Freq: Every day | ORAL | Status: DC
  Administered 2017-06-24 – 2017-07-05 (×12): 40 mg via ORAL
  Filled 2017-06-23 (×12): qty 1

## 2017-06-23 MED ORDER — LEVOTHYROXINE SODIUM 100 MCG PO TABS
100.0000 ug | ORAL_TABLET | Freq: Every day | ORAL | Status: DC
  Administered 2017-06-24 – 2017-07-05 (×12): 100 ug via ORAL
  Filled 2017-06-23 (×12): qty 1

## 2017-06-23 MED ORDER — SALINE SPRAY 0.65 % NA SOLN
1.0000 | NASAL | Status: DC | PRN
  Administered 2017-06-24: 1 via NASAL
  Filled 2017-06-23: qty 44

## 2017-06-23 MED ORDER — LEVETIRACETAM 750 MG PO TABS: 750.0000 mg | ORAL_TABLET | Freq: Two times a day (BID) | ORAL | 0 refills | Status: DC

## 2017-06-23 MED ORDER — FLEET ENEMA 7-19 GM/118ML RE ENEM: 1.0000 | ENEMA | Freq: Once | RECTAL | Status: DC | PRN

## 2017-06-23 MED ORDER — TRAZODONE HCL 50 MG PO TABS
25.0000 mg | ORAL_TABLET | Freq: Every evening | ORAL | Status: DC | PRN
  Administered 2017-06-23 – 2017-06-28 (×4): 50 mg via ORAL
  Filled 2017-06-23 (×4): qty 1

## 2017-06-23 MED ORDER — PROCHLORPERAZINE MALEATE 5 MG PO TABS: 5.0000 mg | ORAL_TABLET | Freq: Four times a day (QID) | ORAL | Status: DC | PRN

## 2017-06-23 MED ORDER — RIVAROXABAN 20 MG PO TABS: 20.0000 mg | ORAL_TABLET | Freq: Every day | ORAL | 0 refills | Status: DC

## 2017-06-23 MED ORDER — GUAIFENESIN-DM 100-10 MG/5ML PO SYRP
5.0000 mL | ORAL_SOLUTION | Freq: Four times a day (QID) | ORAL | Status: DC | PRN
  Administered 2017-06-24 – 2017-06-29 (×5): 10 mL via ORAL
  Filled 2017-06-23 (×5): qty 10

## 2017-06-23 MED ORDER — ALUM & MAG HYDROXIDE-SIMETH 200-200-20 MG/5ML PO SUSP: 30.0000 mL | ORAL | Status: DC | PRN

## 2017-06-23 MED ORDER — MONTELUKAST SODIUM 10 MG PO TABS
10.0000 mg | ORAL_TABLET | Freq: Every day | ORAL | Status: DC
  Administered 2017-06-23 – 2017-07-04 (×12): 10 mg via ORAL
  Filled 2017-06-23 (×12): qty 1

## 2017-06-23 MED ORDER — PROCHLORPERAZINE EDISYLATE 5 MG/ML IJ SOLN: 5.0000 mg | Freq: Four times a day (QID) | INTRAMUSCULAR | Status: DC | PRN

## 2017-06-23 MED ORDER — METOPROLOL SUCCINATE ER 50 MG PO TB24
100.0000 mg | ORAL_TABLET | Freq: Every day | ORAL | Status: DC
  Administered 2017-06-24 – 2017-07-05 (×12): 100 mg via ORAL
  Filled 2017-06-23 (×12): qty 2

## 2017-06-23 MED ORDER — DULOXETINE HCL 60 MG PO CPEP
60.0000 mg | ORAL_CAPSULE | Freq: Every day | ORAL | Status: DC
  Administered 2017-06-24 – 2017-07-05 (×12): 60 mg via ORAL
  Filled 2017-06-23 (×12): qty 1

## 2017-06-23 MED ORDER — INSULIN ASPART 100 UNIT/ML ~~LOC~~ SOLN
0.0000 [IU] | Freq: Every day | SUBCUTANEOUS | Status: DC
  Administered 2017-06-24 – 2017-06-26 (×2): 2 [IU] via SUBCUTANEOUS

## 2017-06-23 MED ORDER — ORAL CARE MOUTH RINSE
15.0000 mL | Freq: Two times a day (BID) | OROMUCOSAL | Status: DC
  Administered 2017-06-23 – 2017-07-04 (×18): 15 mL via OROMUCOSAL

## 2017-06-23 MED ORDER — LEVETIRACETAM 500 MG/5ML IV SOLN
750.0000 mg | Freq: Two times a day (BID) | INTRAVENOUS | Status: DC
  Administered 2017-06-23: 750 mg via INTRAVENOUS
  Filled 2017-06-23 (×2): qty 7.5

## 2017-06-23 MED ORDER — SODIUM CHLORIDE 0.9% FLUSH: 10.0000 mL | Freq: Two times a day (BID) | INTRAVENOUS | Status: DC

## 2017-06-23 MED ORDER — LORAZEPAM 2 MG/ML IJ SOLN: 0.5000 mg | INTRAVENOUS | Status: DC | PRN

## 2017-06-23 MED ORDER — ENOXAPARIN SODIUM 40 MG/0.4ML ~~LOC~~ SOLN: 40.0000 mg | SUBCUTANEOUS | Status: DC

## 2017-06-23 MED ORDER — PROCHLORPERAZINE 25 MG RE SUPP
12.5000 mg | Freq: Four times a day (QID) | RECTAL | Status: DC | PRN
  Filled 2017-06-23: qty 1

## 2017-06-23 MED ORDER — ALBUTEROL SULFATE (2.5 MG/3ML) 0.083% IN NEBU
2.5000 mg | INHALATION_SOLUTION | RESPIRATORY_TRACT | Status: DC | PRN
  Administered 2017-06-24 – 2017-06-27 (×2): 2.5 mg via RESPIRATORY_TRACT
  Filled 2017-06-23 (×2): qty 3

## 2017-06-23 MED ORDER — SODIUM CHLORIDE 0.9% FLUSH: 10.0000 mL | INTRAVENOUS | Status: DC | PRN

## 2017-06-23 MED ORDER — INSULIN ASPART 100 UNIT/ML ~~LOC~~ SOLN
0.0000 [IU] | Freq: Three times a day (TID) | SUBCUTANEOUS | Status: DC
Start: 2017-06-23 — End: 2017-07-05
  Administered 2017-06-23: 1 [IU] via SUBCUTANEOUS
  Administered 2017-06-24 (×2): 2 [IU] via SUBCUTANEOUS
  Administered 2017-06-24: 1 [IU] via SUBCUTANEOUS
  Administered 2017-06-25 – 2017-06-27 (×6): 2 [IU] via SUBCUTANEOUS
  Administered 2017-06-27: 1 [IU] via SUBCUTANEOUS
  Administered 2017-06-27 – 2017-06-29 (×5): 2 [IU] via SUBCUTANEOUS
  Administered 2017-06-29: 0 [IU] via SUBCUTANEOUS
  Administered 2017-06-29: 2 [IU] via SUBCUTANEOUS
  Administered 2017-06-30: 1 [IU] via SUBCUTANEOUS
  Administered 2017-06-30: 2 [IU] via SUBCUTANEOUS
  Administered 2017-06-30: 1 [IU] via SUBCUTANEOUS
  Administered 2017-07-01 – 2017-07-03 (×5): 2 [IU] via SUBCUTANEOUS
  Administered 2017-07-03 – 2017-07-04 (×3): 1 [IU] via SUBCUTANEOUS
  Administered 2017-07-04: 3 [IU] via SUBCUTANEOUS
  Administered 2017-07-05: 2 [IU] via SUBCUTANEOUS

## 2017-06-23 MED ORDER — LEVETIRACETAM 750 MG PO TABS
750.0000 mg | ORAL_TABLET | Freq: Two times a day (BID) | ORAL | Status: DC
  Administered 2017-06-24 – 2017-07-05 (×23): 750 mg via ORAL
  Filled 2017-06-23 (×23): qty 1

## 2017-06-23 MED ORDER — POLYETHYLENE GLYCOL 3350 17 G PO PACK
17.0000 g | PACK | Freq: Every day | ORAL | Status: DC | PRN
  Administered 2017-07-02: 17 g via ORAL
  Filled 2017-06-23: qty 1

## 2017-06-23 MED ORDER — HYDROCORTISONE 1 % EX CREA
TOPICAL_CREAM | Freq: Three times a day (TID) | CUTANEOUS | Status: DC | PRN
  Administered 2017-06-25 – 2017-06-27 (×2): via TOPICAL
  Filled 2017-06-23: qty 28

## 2017-06-23 MED ORDER — GLUCERNA SHAKE PO LIQD
237.0000 mL | Freq: Three times a day (TID) | ORAL | Status: DC
  Administered 2017-06-23 – 2017-07-05 (×4): 237 mL via ORAL

## 2017-06-23 NOTE — H&P (Signed)
Physical Medicine and Rehabilitation Admission H&P        Chief Complaint  Patient presents with  . Functional deficits from Status epilepticus with VDRF and debility.          HPI:   Amanda Short is a 76 y.o. female with history of HTN, T2DM, h/o PE, recent bout with diverticulitis 11/11 to 05/30/17; who was admitted on 06/06/17 with AMS with weakness left side and left gaze deviation. Stat CT negative reviewed, unremarkable for acute process. Seizure activity in Xray with difficulty protecting airway and was intubated. She was loaded with keppra and required multiple doses IV ativan due to status epilepticus. Low Ca/Mg levels supplemented and LTM EEG showed evidence of severe diffuse encephalopathy. Hospital course significant for agitation requiring precedex as well as restraints, inability to tolerate extubation requiring emergent tracheostomy at bedside due to stridor/marked swelling of VC on 06/12/17. She was stated on IV antibiotics for aspiration PNA and has been weaned to ATC.    Swallow evaluation done and she was started on regular diet with recommendations to downgrade to thickened liquids in case of respiratory decline. She has been tolerating regular diet but continues to have desaturations at nights. Trach downsizing was held due to higher oxygen needs of 60% Fio2. 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  She continues to have cognitive deficits with decreased balance and hypoxia affecting activity. CIR recommended due to functional deficits.      ROS          Past Medical History:  Diagnosis Date  . Arthritis      "back, hands" (07/22/2015)  . Cancer of left breast ([place]) 2006    S/P lumpectomy  . Complication of anesthesia      "brief breathing problem at surgery center in ~ 2005 when I had gallbladder OR"  . Depression    . DVT (deep venous thrombosis) ([place]) 03/2013     LLE  . GERD (gastroesophageal reflux disease)    . Hyperlipidemia    . Hypertension    . Hypothyroidism    . OSA treated with BiPAP    . Pulmonary embolism ([place]) 03/2013  . Thyroid disease      Hypothyroid  . Type II diabetes mellitus ([place])             Past Surgical History:  Procedure Laterality Date  . ABDOMINAL HYSTERECTOMY   1970s  . BREAST BIOPSY Left 2006  . BREAST LUMPECTOMY Left 2006  . BUNIONECTOMY WITH HAMMERTOE RECONSTRUCTION Left    . ESOPHAGOGASTRODUODENOSCOPY N/A 05/29/2017    Procedure: ESOPHAGOGASTRODUODENOSCOPY (EGD);  Surgeon: Irene Shipper, MD;  Location: [facility];  Service: Endoscopy;  Laterality: N/A;  . JOINT REPLACEMENT      . LAPAROSCOPIC CHOLECYSTECTOMY   ~ 2005  . TOTAL HIP ARTHROPLASTY Left 2010  . TOTAL KNEE ARTHROPLASTY Bilateral 2005-2006           Family History  Problem Relation Age of Onset  . Diabetes Unknown    . Diabetes Mellitus II Sister        Social History:  reports that she has quit smoking. Her smoking use included cigarettes. She has a 30.00 pack-year smoking history. she has never used smokeless tobacco. She reports that she does not drink alcohol or use drugs. Allergies:      Allergies  Allergen Reactions  . Allopurinol Nausea And Vomiting  Medications Prior to Admission  Medication Sig Dispense Refill  . calcium-vitamin D (OSCAL 500/200 D-3) 500-200 MG-UNIT tablet Take 1 tablet 2 (two) times daily by mouth. 20 tablet 0  . DULoxetine (CYMBALTA) 60 MG capsule Take 60 mg by mouth daily.      . febuxostat (ULORIC) 40 MG tablet Take 40 mg daily by mouth.      . levothyroxine (SYNTHROID, LEVOTHROID) 100 MCG tablet Take 100 mcg by mouth daily before breakfast.      . metFORMIN (GLUCOPHAGE) 500 MG tablet Take 500 mg by mouth 2 (two) times daily with a meal.       . metoprolol (TOPROL-XL) 200 MG 24 hr tablet Take 100 mg by mouth at bedtime.       . montelukast (SINGULAIR) 10 MG tablet Take 10 mg at bedtime by mouth.       . Multiple Vitamin (MULTIVITAMIN WITH MINERALS) TABS tablet Take 1 tablet daily by mouth.      Marland Kitchen omeprazole (PRILOSEC) 40 MG capsule Take 40 mg by mouth 2 (two) times daily.      . Rivaroxaban (XARELTO) 15 MG TABS tablet Take 15 mg by mouth at bedtime.       Marland Kitchen spironolactone (ALDACTONE) 25 MG tablet Take 50 mg daily by mouth.      Marland Kitchen amoxicillin-clavulanate (AUGMENTIN) 875-125 MG tablet Take 1 tablet every 12 (twelve) hours by mouth. (Patient not taking: Reported on 06/06/2017) 10 tablet 0  . furosemide (LASIX) 40 MG tablet Take 1 tablet (40 mg total) daily by mouth. Hold until follow up with PCP          Drug Regimen Review Drug regimen was reviewed and the following issues were identified and addressed Added home dose Singulair   Home: Home Living Family/patient expects to be discharged to:: Private residence Living Arrangements: Spouse/significant other Available Help at Discharge: Family, Available 24 hours/day Type of Home: House Home Access: Level entry Home Layout: One level Bathroom Shower/Tub: Chiropodist: Standard Home Equipment: Environmental consultant - 2 wheels, Shower seat, Radio producer - single point, Bedside commode   Functional History: Prior Function Level of Independence: Independent   Functional Status:  Mobility: Bed Mobility Overal bed mobility: Needs Assistance Bed Mobility: Sit to Supine Rolling: Min guard Supine to sit: Min assist, HOB elevated Sit to supine: Min guard General bed mobility comments: In chair upon entry.  Transfers Overall transfer level: Needs assistance Equipment used: Rolling walker (2 wheeled) Transfers: Sit to/from Stand Sit to Stand: Min assist Stand pivot transfers: Min assist, +2 safety/equipment General transfer comment: Min A for lift assist and steadying. Verbal cues for safe hand placement. Able to stand for ~ 5 min for clean up. Sats stable on 28% FiO2, however, upon sitting dropped to lower 80's. Required prolonged seated  rest, cues for deep breathing and removal of passy muir valve for sats to return to 90% on 28% before attempting gait.  Ambulation/Gait Ambulation/Gait assistance: Min assist, +2 safety/equipment Ambulation Distance (Feet): 30 Feet Assistive device: Rolling walker (2 wheeled) Gait Pattern/deviations: Step-through pattern, Decreased stride length, Trunk flexed General Gait Details: Slow, unsteady gait, however, improved steadiness from previous session. Occasional R lateral lean during gait and min to min guard A for steadying with chair follow. Pt required standing rests X 3 during gait secondary to SOB. Oxygen sats from 88%-92% on 28% FiO2.  Gait velocity: Decreased Gait velocity interpretation: Below normal speed for age/gender   ADL: ADL Overall ADL's : Needs assistance/impaired Eating/Feeding: Supervision/  safety, Sitting Grooming: Supervision/safety, Sitting Upper Body Bathing: Minimal assistance, Sitting Lower Body Bathing: Moderate assistance, Sit to/from stand Upper Body Dressing : Minimal assistance, Sitting Lower Body Dressing: Sit to/from stand, Maximal assistance Lower Body Dressing Details (indicate cue type and reason): Pt able to don Lt sock with mod A, max A for Rt sock  Toilet Transfer: Minimal assistance, +2 for physical assistance, Ambulation, Comfort height toilet, Regular Toilet, Grab bars, RW Toilet Transfer Details (indicate cue type and reason): Min assist for sit<>stand with min assist and march in place. Sitting impulsively and unable to progress beyond marching in place.  Toileting- Clothing Manipulation and Hygiene: Maximal assistance, Sit to/from stand Functional mobility during ADLs: Minimal assistance, +2 for physical assistance, Rolling walker General ADL Comments: Pt with very poor activity tolerance for ADL. She requires cues for processing and to follow commands well.    Cognition: Cognition Overall Cognitive Status: Within Functional Limits for tasks  assessed Orientation Level: Oriented X4 Cognition Arousal/Alertness: Awake/alert Behavior During Therapy: WFL for tasks assessed/performed Overall Cognitive Status: Within Functional Limits for tasks assessed Area of Impairment: Attention, Safety/judgement, Problem solving, Following commands Current Attention Level: Selective Memory: Decreased short-term memory Following Commands: Follows one step commands consistently, Follows multi-step commands inconsistently Safety/Judgement: Decreased awareness of safety Problem Solving: Slow processing, Difficulty sequencing, Requires verbal cues General Comments: Difficulty with processing simple commands.  Difficult to assess due to: Tracheostomy   Physical Exam: Blood pressure (!) 154/82, pulse 62, temperature 98.8 F (37.1 C), temperature source Oral, resp. rate 15, height _0  (1.549 m), weight 95.9 kg (211 lb 6.7 oz), SpO2 93 %. Physical Exam   General: No acute distress Mood and affect are appropriate Neck #6 Shiley cuffed with PMV Heart: Regular rate and rhythm no rubs murmurs or extra sounds Lungs: Clear to auscultation, breathing unlabored, no rales or wheezes Abdomen: Positive bowel sounds, soft nontender to palpation, nondistended Extremities: No clubbing, cyanosis, or edema Skin: No evidence of breakdown, no evidence of rash Neurologic: Cranial nerves II through XII intact, motor strength is 4/5 in bilateral deltoid, bicep, tricep, grip, hip flexor, knee extensors, ankle dorsiflexor and plantar flexor Sensory exam normal sensation to light touch and proprioception in bilateral upper and lower extremities  Musculoskeletal: no pain with Active range of motion in all 4 extremities. No joint swelling Lab Results Last 48 Hours        Results for orders placed or performed during the hospital encounter of 06/06/17 (from the past 48 hour(s))  Glucose, capillary     Status: Abnormal    Collection Time: 06/20/17  4:31 PM  Result Value  Ref Range    Glucose-Capillary 182 (H) 65 - 99 mg/dL    Comment 1 Notify RN      Comment 2 Document in Chart    Glucose, capillary     Status: Abnormal    Collection Time: 06/20/17  9:07 PM  Result Value Ref Range    Glucose-Capillary 159 (H) 65 - 99 mg/dL  Glucose, capillary     Status: Abnormal    Collection Time: 06/21/17  7:42 AM  Result Value Ref Range    Glucose-Capillary 151 (H) 65 - 99 mg/dL  Glucose, capillary     Status: Abnormal    Collection Time: 06/21/17 11:41 AM  Result Value Ref Range    Glucose-Capillary 197 (H) 65 - 99 mg/dL  Glucose, capillary     Status: Abnormal    Collection Time: 06/21/17  4:15 PM  Result Value  Ref Range    Glucose-Capillary 147 (H) 65 - 99 mg/dL  Glucose, capillary     Status: Abnormal    Collection Time: 06/21/17  9:02 PM  Result Value Ref Range    Glucose-Capillary 164 (H) 65 - 99 mg/dL    Comment 1 Notify RN    Glucose, capillary     Status: Abnormal    Collection Time: 06/22/17  8:06 AM  Result Value Ref Range    Glucose-Capillary 142 (H) 65 - 99 mg/dL    Comment 1 Notify RN      Comment 2 Document in Chart    Glucose, capillary     Status: Abnormal    Collection Time: 06/22/17 11:39 AM  Result Value Ref Range    Glucose-Capillary 275 (H) 65 - 99 mg/dL    Comment 1 Notify RN      Comment 2 Document in Chart    Basic metabolic panel     Status: Abnormal    Collection Time: 06/22/17  1:22 PM  Result Value Ref Range    Sodium 136 135 - 145 mmol/L    Potassium 4.0 3.5 - 5.1 mmol/L    Chloride 92 (L) 101 - 111 mmol/L    CO2 35 (H) 22 - 32 mmol/L    Glucose, Bld 210 (H) 65 - 99 mg/dL    BUN 7 6 - 20 mg/dL    Creatinine, Ser 0.75 0.44 - 1.00 mg/dL    Calcium 9.0 8.9 - 10.3 mg/dL    GFR calc non Af Amer >60 >60 mL/min    GFR calc Af Amer >60 >60 mL/min      Comment: (NOTE) The eGFR has been calculated using the CKD EPI equation. This calculation has not been validated in all clinical situations. eGFR's persistently <60 mL/min  signify possible Chronic Kidney Disease.      Anion gap 9 5 - 15      Imaging Results (Last 48 hours)  No results found.           Medical Problem List and Plan: 1.  Debility secondary to VDRF 2.  DVT Prophylaxis/Anticoagulation: Pharmaceutical: Xarelto 3. Pain Management: acetaminophen 4. Mood: very anxious change IV ativan to po and wean as pt improve, needs neuropsych eval 5. Neuropsych: This patient is capable of making decisions on her own behalf. 6. Skin/Wound Care: Prophyllactic foam dressing to sacrum 7. Fluids/Electrolytes/Nutrition: carb mod diet 8. 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 Change to po 9. VDRF: Continue ATC with pulmonary hygiene. Wean trach to #4 cuffless as per trach team 10. 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  11. Anxiety disorder: Continue ativan prn       Post Admission Physician Evaluation: 1. Functional deficits secondary  to debility. 2. Patient is admitted to receive collaborative, interdisciplinary care between the physiatrist, rehab nursing staff, and therapy team. 3. Patient's level of medical complexity and substantial therapy needs in context of that medical necessity cannot be provided at a lesser intensity of care such as a SNF. 4. Patient has experienced substantial functional loss from his/her baseline which was documented above under the "Functional History" and "Functional Status" headings.  Judging by the patient's diagnosis, physical exam, and functional history, the patient has potential for functional progress which will result in measurable gains while on inpatient rehab.  These gains will be of substantial and practical use upon discharge  in facilitating mobility and self-care at the household level. 5. Physiatrist will provide 24 hour management of medical  needs as well as oversight of the therapy plan/treatment and provide guidance as appropriate regarding the interaction of the two. 6. The Preadmission Screening has  been reviewed and patient status is unchanged unless otherwise stated above. 7. 24 hour rehab nursing will assist with bladder management, bowel management, safety, skin/wound care, disease management, medication administration, pain management and patient education  and help integrate therapy concepts, techniques,education, etc. 8. PT will assess and treat for/with: pre gait, gait training, endurance , safety, equipment, neuromuscular re education.   Goals are: Mod I. 9. OT will assess and treat for/with: ADLs, Cognitive perceptual skills, Neuromuscular re education, safety, endurance, equipment.   Goals are: Mod I. Therapy may not proceed with showering this patient. 10. SLP will assess and treat for/with: NA.  Goals are: NA. 11. Case Management and Social Worker will assess and treat for psychological issues and discharge planning. 12. Team conference will be held weekly to assess progress toward goals and to determine barriers to discharge. 13. Patient will receive at least 3 hours of therapy per day at least 5 days per week. 14. ELOS: 10-14d       15. Prognosis:  excellent         Charlett Blake M.D. [facility] FAAPM&R (Sports Med, Neuromuscular Med) Diplomate Am Board of Electrodiagnostic Med  Flora Lipps 06/22/2017

## 2017-06-23 NOTE — Progress Notes (Signed)
Patient arrived with staff and husband. Patient welcomed and oriented to Rehab. No questions or concerns at this time

## 2017-06-23 NOTE — Progress Notes (Signed)
Physical Medicine and Rehabilitation Consult   Reason for Consult: Status epilepticus with VDRF and debility.  Referring Physician: Dr. Ree Kida.    HPI: Amanda Short is a 76 y.o. female with history of HTN, T2DM, h/o PE, recent bout with diverticulitis 11/11 to 05/30/17; who was admitted on 06/06/17 with AMS with weakness left side and left gaze deviation. Stat CT negative reviewed, unremarkable for acute process. Seizure activity in Xray with difficulty protecting airway and was intubated. She was loaded with keppra and required multiple doses IV ativan due to status epilepticus. Low Ca/Mg levels supplemented and LTM EEG showed evidence of severe diffuse encephalopathy. Hospital course significant for agitation requiring precedex as well as restraints, inability to tolerate extubation requiring emergent tracheostomy at bedside due to stridor/marked swelling of VC on 06/12/17. She was stated on IV antibiotics for aspiration PNA and weaned to ATC. Swallow evaluation done and she was started on regular diet with recommendations to downgrade to thickened liquids ins case of respiratory decline. PT evaluation attempted but patient declined as had been up in chair for 4 hours with nursing. CIR recommended due to debility.    Review of Systems  HENT: Negative for hearing loss.   Respiratory: Positive for cough.   Cardiovascular: Negative for palpitations.  Gastrointestinal: Negative for heartburn and nausea.  Genitourinary: Negative for dysuria and urgency.  Musculoskeletal: Positive for back pain, joint pain and myalgias.  Neurological: Positive for seizures, weakness (past few weeks since discharge) and headaches. Negative for focal weakness.  Psychiatric/Behavioral: Negative for memory loss. The patient is not nervous/anxious.   All other systems reviewed and are negative.         Past Medical History:  Diagnosis Date  . Arthritis    "back, hands" (07/22/2015)  . Cancer of left  breast ([place]) 2006   S/P lumpectomy  . Complication of anesthesia    "brief breathing problem at surgery center in ~ 2005 when I had gallbladder OR"  . Depression   . DVT (deep venous thrombosis) ([place]) 03/2013   LLE  . GERD (gastroesophageal reflux disease)   . Hyperlipidemia   . Hypertension   . Hypothyroidism   . OSA treated with BiPAP   . Pulmonary embolism ([place]) 03/2013  . Thyroid disease    Hypothyroid  . Type II diabetes mellitus ([place])          Past Surgical History:  Procedure Laterality Date  . ABDOMINAL HYSTERECTOMY  1970s  . BREAST BIOPSY Left 2006  . BREAST LUMPECTOMY Left 2006  . BUNIONECTOMY WITH HAMMERTOE RECONSTRUCTION Left   . ESOPHAGOGASTRODUODENOSCOPY N/A 05/29/2017   Procedure: ESOPHAGOGASTRODUODENOSCOPY (EGD);  Surgeon: Irene Shipper, MD;  Location: [facility];  Service: Endoscopy;  Laterality: N/A;  . JOINT REPLACEMENT    . LAPAROSCOPIC CHOLECYSTECTOMY  ~ 2005  . TOTAL HIP ARTHROPLASTY Left 2010  . TOTAL KNEE ARTHROPLASTY Bilateral 2005-2006         Family History  Problem Relation Age of Onset  . Diabetes Unknown   . Diabetes Mellitus II Sister     Social History:  Married. Independent with walker PTA. Per  reports that she has quit smoking. Her smoking use included cigarettes. She has a 30.00 pack-year smoking history. she has never used smokeless tobacco. She reports that she does not drink alcohol or use drugs.         Allergies  Allergen Reactions  . Allopurinol Nausea And Vomiting          Medications Prior to Admission  Medication Sig Dispense Refill  . calcium-vitamin D (OSCAL 500/200 D-3) 500-200 MG-UNIT tablet Take 1 tablet 2 (two) times daily by mouth. 20 tablet 0  . DULoxetine (CYMBALTA) 60 MG capsule Take 60 mg by mouth daily.    . febuxostat (ULORIC) 40 MG tablet Take 40 mg daily by mouth.    . levothyroxine (SYNTHROID, LEVOTHROID) 100 MCG tablet Take 100 mcg by mouth daily before breakfast.     . metFORMIN (GLUCOPHAGE) 500 MG tablet Take 500 mg by mouth 2 (two) times daily with a meal.     . metoprolol (TOPROL-XL) 200 MG 24 hr tablet Take 100 mg by mouth at bedtime.     . montelukast (SINGULAIR) 10 MG tablet Take 10 mg at bedtime by mouth.    . Multiple Vitamin (MULTIVITAMIN WITH MINERALS) TABS tablet Take 1 tablet daily by mouth.    Marland Kitchen omeprazole (PRILOSEC) 40 MG capsule Take 40 mg by mouth 2 (two) times daily.    . Rivaroxaban (XARELTO) 15 MG TABS tablet Take 15 mg by mouth at bedtime.     Marland Kitchen spironolactone (ALDACTONE) 25 MG tablet Take 50 mg daily by mouth.    Marland Kitchen amoxicillin-clavulanate (AUGMENTIN) 875-125 MG tablet Take 1 tablet every 12 (twelve) hours by mouth. (Patient not taking: Reported on 06/06/2017) 10 tablet 0  . furosemide (LASIX) 40 MG tablet Take 1 tablet (40 mg total) daily by mouth. Hold until follow up with PCP      Home: Home Living Family/patient expects to be discharged to:: Private residence Living Arrangements: Spouse/significant other Available Help at Discharge: Family, Available 24 hours/day Type of Home: House Home Access: Level entry [place]: One [place]: Environmental consultant - 2 wheels, Shower seat, Cane - single point, Bedside commode  Functional History: Prior Function Level of Independence: Independent Functional Status:  Mobility: Bed Mobility Overal bed mobility: Needs Assistance Bed Mobility: Rolling Rolling: Min guard General bed mobility comments: Increased time. Assist for safety.  Patient declined OOB - had been up in chair 4 hours with nursing. Transfers General transfer comment: NT  ADL:  Cognition: Cognition Overall Cognitive Status: No family/caregiver present to determine baseline cognitive functioning Orientation Level: Oriented X4 Cognition Arousal/Alertness: Awake/alert Behavior During Therapy: WFL for tasks assessed/performed, Flat affect Overall Cognitive Status: No family/caregiver present to  determine baseline cognitive functioning General Comments: Not oriented to time. Able to follow simple commands.   Blood pressure (!) 153/86, pulse 75, temperature 98.7 F (37.1 C), temperature source Oral, resp. rate 18, height _0  (1.549 m), weight 102.7 kg (226 lb 6.6 oz), SpO2 98 %. Physical Exam  Nursing note and vitals reviewed. Constitutional: She is oriented to person, place, and time. She appears well-developed and well-nourished. She is sleeping. She is easily aroused.  HENT:  Head: Normocephalic and atraumatic.  Eyes: Conjunctivae and EOM are normal. Pupils are equal, round, and reactive to light. Right eye exhibits no discharge. Left eye exhibits no discharge.  Neck: Normal range of motion.  Cuffed #6 trach in place with ATC.  Cardiovascular: Normal rate and regular rhythm.  Respiratory: Effort normal and breath sounds normal. No stridor. No respiratory distress. She exhibits no tenderness.  +[place]  GI: Soft. Bowel sounds are normal. She exhibits no distension. There is no tenderness.  Musculoskeletal: She exhibits no edema or tenderness.  Neurological: She is alert, oriented to person, place, and time and easily aroused.  Right gaze preference but able to turn to the left with minimal cues.  Able to follow  commands. Motor: 4/5 grossly throughout  Skin: Skin is warm and dry.  Psychiatric: She has a normal mood and affect. Her behavior is normal.  Assessment/Plan: Diagnosis: Debility Labs and images independently reviewed.  Records reviewed and summated above.  1. Does the need for close, 24 hr/day medical supervision in concert with the patient's rehab needs make it unreasonable for this patient to be served in a less intensive setting? Yes  2. Co-Morbidities requiring supervision/potential complications: aspiration PNA (cont abx), severe diffuse encephalopathy, status epilepticus (cont meds), HTN (monitor and provide prns in accordance with increased physical exertion and  pain), T2 DM (Monitor in accordance with exercise and adjust meds as necessary), h/o PE (transition from heparin ggt when appropriate), diverticulitis, tachypnea (monitor RR and O2 Sats with increased physical exertion), ABLA (transfuse if necessary to ensure appropriate perfusion for increased activity tolerance) 3. Due to safety, skin/wound care, disease management, pain management and patient education, does the patient require 24 hr/day rehab nursing? Yes 4. Does the patient require coordinated care of a physician, rehab nurse, PT (1-2 hrs/day, 5 days/week), OT (1-2 hrs/day, 5 days/week) and SLP (1-2 hrs/day, 5 days/week) to address physical and functional deficits in the context of the above medical diagnosis(es)? Yes Addressing deficits in the following areas: balance, endurance, locomotion, strength, transferring, bowel/bladder control, bathing, dressing, toileting, cognition, speech, swallowing and psychosocial support 5. Can the patient actively participate in an intensive therapy program of at least 3 hrs of therapy per day at least 5 days per week? Potentially 6. The potential for patient to make measurable gains while on inpatient rehab is excellent 7. Anticipated functional outcomes upon discharge from inpatient rehab are supervision  with PT, supervision with OT, supervision with SLP. 8. Estimated rehab length of stay to reach the above functional goals is: 14-18 days. 9. Anticipated D/C setting: Home 10. Anticipated post D/C treatments: HH therapy and Home excercise program 11. Overall Rehab/Functional Prognosis: good  RECOMMENDATIONS: This patient's condition is appropriate for continued rehabilitative care in the following setting: CIR in the near future Patient has agreed to participate in recommended program. Yes Note that insurance prior authorization may be required for reimbursement for recommended care.  Comment: Rehab Admissions Coordinator to follow up.  Delice Lesch,  MD, ABPMR [facility] [place], [place] 06/18/2017          Revision History                             Routing History

## 2017-06-23 NOTE — Plan of Care (Signed)
  RH BLADDER ELIMINATION RH STG MANAGE BLADDER WITH ASSISTANCE Description STG Manage Bladder With [place]  Incontinent requiring total assistance. 06/23/2017 2321 - Not Progressing by Cornell Barman, RN

## 2017-06-23 NOTE — Progress Notes (Signed)
PMR Admission Coordinator Pre-Admission Assessment  Patient: Roshonda Sperl is an 76 y.o., female MRN: [id number] DOB: 02/10/1941 Height: _0  (154.9 cm) Weight: 95.9 kg (211 lb 6.7 oz)                                                                                                                                                  Insurance Information HMO:     PPO: X     PCP:      IPA:      80/20:      OTHER:  PRIMARY: UHC Medicare       Policy#: [id number]      Subscriber: Self CM Name: Vevelyn Royals      Phone#: [phone]     Fax#: [phone] Pre-Cert#: [id number] given by Orvan July on 06/21/17 good for 7 days       Employer: Retired  Benefits:  Phone #: [phone]     Name: Verified online [email or web address] Eff. Date: 07/17/16     Deduct: $0      Out of Pocket Max: $4000      Life Max: N/A CIR: $160 a day, days 1-10; $0 a day, days 11+      SNF: $0 a day, days 1-20; $50 a day, days 21-100 Outpatient: PT/OT/SLP     Co-Pay: $20 per visit  Home Health: 100%      Co-Pay: none DME: 80%     Co-Pay: 20% Providers: In-network   SECONDARY: None      Policy#:       Subscriber:  CM Name:       Phone#:      Fax#:  Pre-Cert#:       Employer:  Benefits:  Phone #:      Name:  Eff. Date:      Deduct:       Out of Pocket Max:       Life Max:  CIR:       SNF:  Outpatient:      Co-Pay:  Home Health:       Co-Pay:  DME:      Co-Pay:   Medicaid Application Date:       Case Manager:  Disability Application Date:       Case Worker:   Emergency Contact Information        Contact Information    Name Relation Home Work Mobile   Ressel,Sid Spouse   [phone]     Current Medical History  Patient Admitting Diagnosis: Debility  History of Present Illness: Meilyn Harkleroadis a 76 y.o.femalewith history of HTN, T2DM, h/o PE, recent bout with diverticulitis 11/11to 05/30/17; who was admitted on 06/06/17 with AMS with weakness left side and left gaze deviation. Stat CT negativereviewed,  unremarkable for acute process. Seizure activity in Xray with difficulty protecting airway and was intubated. She  was loaded with keppra and required multiple doses IV ativan due to status epilepticus. Low Ca/Mg levels supplemented and LTM EEG showed evidence of severe diffuse encephalopathy. Hospital course significant for agitation requiring precedex as well as restraints, inability to tolerate extubation requiring emergent tracheostomy at bedside due to stridor/marked swelling of VC on 06/12/17. She was stated on IV antibiotics for aspiration PNA andhas beenweaned to ATC.   Swallow evaluation done and she was started on regular diet with recommendations to downgrade to thickened liquids in case of respiratory decline.She has been tolerating regular diet but continues to have desaturations at nights. Trach downsizing was held due to higher oxygen needs of 60% Fio2. 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 She continues to have cognitive deficits with decreased balance and hypoxia affecting activity. CIR recommended due to functional deficits.      Past Medical History      Past Medical History:  Diagnosis Date  . Arthritis    "back, hands" (07/22/2015)  . Cancer of left breast ([facility]) 2006   S/P lumpectomy  . Complication of anesthesia    "brief breathing problem at surgery center in ~ 2005 when I had gallbladder OR"  . Depression   . DVT (deep venous thrombosis) ([facility]) 03/2013   LLE  . GERD (gastroesophageal reflux disease)   . Hyperlipidemia   . Hypertension   . Hypothyroidism   . OSA treated with BiPAP   . Pulmonary embolism ([facility]) 03/2013  . Thyroid disease    Hypothyroid  . Type II diabetes mellitus ([facility])     Family History  family history includes Diabetes in her unknown relative; Diabetes Mellitus II in her sister.  Prior Rehab/Hospitalizations:  Has the patient  had major surgery during 100 days prior to admission? No  Current Medications   Current Facility-Administered Medications:  .  acetaminophen (TYLENOL) tablet 650 mg, 650 mg, Oral, Q4H PRN, Wynell Balloon, RPH, 650 mg at 06/22/17 1324 .  albuterol (PROVENTIL) (2.5 MG/3ML) 0.083% nebulizer solution 2.5 mg, 2.5 mg, Nebulization, Q6H, Erick Colace, NP, 2.5 mg at 06/22/17 2010 .  albuterol (PROVENTIL) (2.5 MG/3ML) 0.083% nebulizer solution 2.5 mg, 2.5 mg, Nebulization, Q4H PRN, Cristal Ford, DO, 2.5 mg at 06/20/17 1122 .  bisacodyl (DULCOLAX) suppository 10 mg, 10 mg, Rectal, Daily PRN, Jennelle Human B, NP .  chlorhexidine (PERIDEX) 0.12 % solution 15 mL, 15 mL, Mouth Rinse, BID, Rush Farmer, MD, 15 mL at 06/22/17 2116 .  docusate (COLACE) 50 MG/5ML liquid 100 mg, 100 mg, Per Tube, BID PRN, Jennelle Human B, NP, 100 mg at 06/15/17 0446 .  DULoxetine (CYMBALTA) DR capsule 60 mg, 60 mg, Oral, Daily, Cristal Ford, DO, 60 mg at 06/22/17 0945 .  feeding supplement (GLUCERNA SHAKE) (GLUCERNA SHAKE) liquid 237 mL, 237 mL, Oral, BID BM, Velvet Bathe, MD, 237 mL at 06/22/17 0950 .  fentaNYL (SUBLIMAZE) injection 50 mcg, 50 mcg, Intravenous, Q15 min PRN, Erick Colace, NP .  fentaNYL (SUBLIMAZE) injection 50 mcg, 50 mcg, Intravenous, Q2H PRN, Erick Colace, NP, 50 mcg at 06/21/17 0039 .  hydrocortisone cream 1 %, , Topical, TID PRN, Jani Gravel, MD .  insulin aspart (novoLOG) injection 0-9 Units, 0-9 Units, Subcutaneous, TID WC, Ree Kida Hazelton, DO, 3 Units at 06/22/17 1610 .  levETIRAcetam (KEPPRA) 750 mg in sodium chloride 0.9 % 100 mL IVPB, 750 mg, Intravenous, Q12H, Kerney Elbe, MD, Stopped  at 06/22/17 1713 .  levothyroxine (SYNTHROID, LEVOTHROID) tablet 100 mcg, 100 mcg, Oral, QAC breakfast, Rush Farmer, MD, 100 mcg at 06/22/17 0944 .  LORazepam (ATIVAN) injection 0.5-1 mg, 0.5-1 mg, Intravenous, Q4H PRN, Erick Colace, NP, 0.5 mg at 06/22/17 2032 .  MEDLINE mouth  rinse, 15 mL, Mouth Rinse, q12n4p, Rush Farmer, MD, 15 mL at 06/22/17 1242 .  metoprolol succinate (TOPROL-XL) 24 hr tablet 100 mg, 100 mg, Oral, Daily, Mannam, Praveen, MD, 100 mg at 06/22/17 0945 .  pantoprazole (PROTONIX) EC tablet 40 mg, 40 mg, Oral, Daily, Cristal Ford, DO, 40 mg at 06/22/17 0944 .  rivaroxaban (XARELTO) tablet 20 mg, 20 mg, Oral, Q supper, Cristal Ford, DO, 20 mg at 06/22/17 1629 .  sodium chloride (OCEAN) 0.65 % nasal spray 1 spray, 1 spray, Each Nare, PRN, Blount, Xenia T, NP .  sodium chloride flush (NS) 0.9 % injection 10-40 mL, 10-40 mL, Intracatheter, PRN, Rush Farmer, MD .  spironolactone (ALDACTONE) tablet 50 mg, 50 mg, Oral, Daily, Mannam, Praveen, MD, 50 mg at 06/22/17 0944  Patients Current Diet: Diet Carb Modified Fluid consistency: Thin; Room service appropriate? Yes  Precautions / Restrictions Precautions Precautions: Fall Precaution Comments: trach Restrictions Weight Bearing Restrictions: No   Has the patient had 2 or more falls or a fall with injury in the past year?Yes  Prior Activity Level Community (5-7x/wk): Prior to this admission patient had recently returned home from a stay at [facility] and was using a rolling walker. However, prior to that hospitalization patient was fully independent, enjoyed shopping, visiting with friends, and playing with her cats.    Home Assistive Devices / Equipment Home Assistive Devices/Equipment: Environmental consultant (specify type), Bedside commode/3-in-1, Built-in shower seat(front wheel walker) Home Equipment: Walker - 2 wheels, Shower seat, Cane - single point, Bedside commode  Prior Device Use: Indicate devices/aids used by the patient prior to current illness, exacerbation or injury? Walker for a short time prior to this admission   Prior Functional Level Prior Function Level of Independence: Independent  Self Care: Did the patient need help bathing, dressing, using the toilet or eating?  Independent  Indoor Mobility: Did the patient need assistance with walking from room to room (with or without device)? Independent  Stairs: Did the patient need assistance with internal or external stairs (with or without device)? Independent  Functional Cognition: Did the patient need help planning regular tasks such as shopping or remembering to take medications? Independent  Current Functional Level Cognition  Overall Cognitive Status: Within Functional Limits for tasks assessed Difficult to assess due to: Tracheostomy Current Attention Level: Selective Orientation Level: Oriented X4 Following Commands: Follows one step commands consistently, Follows multi-step commands inconsistently Safety/Judgement: Decreased awareness of safety General Comments: Difficulty with processing simple commands.     Extremity Assessment (includes Sensation/Coordination)  Upper Extremity Assessment: Generalized weakness  Lower Extremity Assessment: Generalized weakness    ADLs  Overall ADL's : Needs assistance/impaired Eating/Feeding: Supervision/ safety, Sitting Grooming: Supervision/safety, Sitting Upper Body Bathing: Minimal assistance, Sitting Lower Body Bathing: Moderate assistance, Sit to/from stand Upper Body Dressing : Minimal assistance, Sitting Lower Body Dressing: Sit to/from stand, Maximal assistance Lower Body Dressing Details (indicate cue type and reason): Pt able to don Lt sock with mod A, max A for Rt sock  Toilet Transfer: Minimal assistance, +2 for physical assistance, Ambulation, Comfort height toilet, Regular Toilet, Grab bars, RW Toilet Transfer Details (indicate cue type and reason): Min assist for sit<>stand with min assist and march  in place. Sitting impulsively and unable to progress beyond marching in place.  Toileting- Clothing Manipulation and Hygiene: Maximal assistance, Sit to/from stand Functional mobility during ADLs: Minimal assistance, +2 for physical  assistance, Rolling walker General ADL Comments: Pt with very poor activity tolerance for ADL. She requires cues for processing and to follow commands well.     Mobility  Overal bed mobility: Needs Assistance Bed Mobility: Sit to Supine Rolling: Min guard Supine to sit: Min assist, HOB elevated Sit to supine: Min guard General bed mobility comments: In chair upon entry.     Transfers  Overall transfer level: Needs assistance Equipment used: Rolling walker (2 wheeled) Transfers: Sit to/from Stand Sit to Stand: Min assist Stand pivot transfers: Min assist, +2 safety/equipment General transfer comment: Min A for lift assist and steadying. Verbal cues for safe hand placement. Able to stand for ~ 5 min for clean up. Sats stable on 28% FiO2, however, upon sitting dropped to lower 80's. Required prolonged seated rest, cues for deep breathing and removal of passy muir valve for sats to return to 90% on 28% before attempting gait.     Ambulation / Gait / Stairs / Wheelchair Mobility  Ambulation/Gait Ambulation/Gait assistance: Min assist, +2 safety/equipment Ambulation Distance (Feet): 30 Feet Assistive device: Rolling walker (2 wheeled) Gait Pattern/deviations: Step-through pattern, Decreased stride length, Trunk flexed General Gait Details: Slow, unsteady gait, however, improved steadiness from previous session. Occasional R lateral lean during gait and min to min guard A for steadying with chair follow. Pt required standing rests X 3 during gait secondary to SOB. Oxygen sats from 88%-92% on 28% FiO2.  Gait velocity: Decreased Gait velocity interpretation: Below normal speed for age/gender    Posture / Balance Dynamic Sitting Balance Sitting balance - Comments: Pt initially required min A for sitting balance EOB due to LOB posteriorly.  However, she progressed to close min gaurd assist.  She required min A to maintain balance while leaning forward to access feet to don socks   Balance Overall balance assessment: Needs assistance, History of Falls Sitting-balance support: Feet supported Sitting balance-Leahy Scale: Fair Sitting balance - Comments: Pt initially required min A for sitting balance EOB due to LOB posteriorly.  However, she progressed to close min gaurd assist.  She required min A to maintain balance while leaning forward to access feet to don socks  Postural control: Right lateral lean Standing balance support: Bilateral upper extremity supported Standing balance-Leahy Scale: Poor Standing balance comment: requires UE support    Special needs/care consideration BiPAP/CPAP: Yes, CPCP at home prior to admission CPM: No Continuous Drip IV: No Dialysis: No         Life Vest: No Oxygen: Yes, 28% FiO2 via trach collar and rest breaks with therapy  Special Bed: No Trach Size: 6 cuffed Shiley trach with PMSV with full supervision  Wound Vac (area): No       Skin: Bruising to left arm and hip                                Bowel mgmt: Continent, last BM 06/20/17 Bladder mgmt:Intermittent incontinence  Diabetic mgmt: Yes      Previous Home Environment Living Arrangements: Spouse/significant other Available Help at Discharge: Family, Available 24 hours/day Type of Home: House Home Layout: One level Home Access: Level entry Bathroom Shower/Tub: Chiropodist: Standard Home Care Services: No  Discharge Living Setting Plans for Discharge  Living Setting: Patient's home, Lives with (comment)(Spouse ) Type of Home at Discharge: House Discharge Home Layout: One level Discharge Home Access: Level entry Discharge Bathroom Shower/Tub: Tub/shower unit, Curtain Discharge Bathroom Toilet: Standard Discharge Bathroom Accessibility: Yes How Accessible: Accessible via walker(tight fit ) Does the patient have any problems obtaining your medications?: No  Social/Family/Support Systems Patient Roles: Spouse Contact Information: Darren Nodal Cell: [phone] Anticipated Caregiver: Spouse Anticipated Caregiver's Contact Information: see above  Ability/Limitations of Caregiver: None, spouse would like patient decannulated  Caregiver Availability: 24/7 Discharge Plan Discussed with Primary Caregiver: Yes Is Caregiver In Agreement with Plan?: Yes Does Caregiver/Family have Issues with Lodging/Transportation while Pt is in Rehab?: No  Goals/Additional Needs Patient/Family Goal for Rehab: PT/OT/SLP: Supervision  Expected length of stay: 14-18 days  Cultural Considerations: [employer]  Dietary Needs: Carb. Mod. diet restrictions  Equipment Needs: TBD Special Service Needs: Patient trailing home CPAP 06/22/17  Pt/Family Agrees to Admission and willing to participate: Yes Program Orientation Provided & Reviewed with Pt/Caregiver Including Roles  & Responsibilities: Yes Additional Information Needs: Would like to be decannulated prior to discharge  Information Needs to be Provided By: Team FYI  Barriers to Discharge: Trach  Decrease burden of Care through IP rehab admission: No  Possible need for SNF placement upon discharge: No  Patient Condition: This patient's medical and functional status has changed since the consult dated: 06/18/17 in which the Rehabilitation Physician determined and documented that the patient's condition is appropriate for intensive rehabilitative care in an inpatient rehabilitation facility. See "History of Present Illness" (above) for medical update. Functional changes are: Min A transfers and Min A +2 gait for 30 feet. Patient's medical and functional status update has been discussed with the Rehabilitation physician and patient remains appropriate for inpatient rehabilitation. Will admit to inpatient rehab tomorrow.  Preadmission Screen Completed By:  Gunnar Fusi, 06/22/2017 10:26 PM ______________________________________________________________________   Discussed status with Dr. Letta Pate  on 06/22/17 at 28 and received telephone approval for admission tomorrow.  Admission Coordinator:  Gunnar Fusi, time 1600/Date 06/22/17             Cosigned by: Charlett Blake, MD at 06/23/2017 12:47 PM  Revision History

## 2017-06-23 NOTE — Discharge Summary (Signed)
Physician Discharge Summary  Amanda Short TMA:[id number] DOB: April 13, 1941 DOA: 06/06/2017  PCP: System, Pcp Not In  Admit date: 06/06/2017 Discharge date: 06/23/2017  Time spent: > 35 minutes  Recommendations for Outpatient Follow-up:  1. Ensure patient follows up with pulmonology 2. Monitor blood sugars and adjust hypoglycemic accordingly   Discharge Diagnoses:  Active Problems:   Acute encephalopathy   Acute respiratory failure with hypoxia (HCC)   Tracheostomy, acute management (HCC)   Aspiration pneumonia (HCC)   Encephalopathy   Status epilepticus ([place])   Benign essential HTN   Diabetes mellitus type 2 in nonobese (HCC)   History of pulmonary embolism   Acute blood loss anemia   Diverticulitis of colon   Tachypnea   Encounter for orogastric (OG) tube placement   Encounter for central line placement   Encounter for attention to tracheostomy [facility])   Discharge Condition: stable  Diet recommendation: Carb modified diet.  Filed Weights   06/21/17 0315 06/22/17 0344 06/23/17 0300  Weight: 96 kg (211 lb 10.3 oz) 95.9 kg (211 lb 6.7 oz) 96 kg (211 lb 10.3 oz)    History of present illness:  76 y.o. female with PMH as outlined below.  She was brought to [facility] ED 11/21 with AMS.  She was last seen normal 4 - 5 hours prior.  Initially called as code stroke. En route to ED, had seizure for which she was given 2.51m of versed.  In ED, had recurrent CT after returning from CT scan.  Received 684mativan and later had decrease in mental status / post ictal state where she was not protecting airway. She was subsequently intubated and PCCM was asked to admit to the ICU  Hospital Course:  Status epilepticus, new seizure disorder -Patient presented with seizure and was intubated for airway protection -Neurology consulted and appreciated, suspected low magnesium at to be a possible precipitant  -She was placed on Keppra 750 mg BID, will continue on d/c -results of EEG below. -No  further seizures reported. -Neuro f/u as outpatient  Acute on chronic hypoxic respiratory fialure -suspected retropharyngeal edema/vocal cord edema s/p emergent trach on 06/12/17 after failing extubation attempt -PCCM continues to follow, plan for change to downsize to 4 cuffless trach this week -Speech therapy consulted to initiate passy muir trials -Per husband, patient uses 2.5 L of oxygen at home - Pt has sleep apnea and uses cpap or oxygen at home.  Fall -patient fell yesterday evening/last night -CT head/cervical spine: No acute cranial process. Moderate cerebral atrophy with chronic small vessel ischemic disease. No acute rheumatic injury within the cervical spine. 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 -Patient currently stable, will continue to monitor closely  Chronic Anemia -Possibly gastritis versus GI bleed -Hemoglobin dropped to 7.2, received no blood transfusions -last hemoglobin 9.2 (baseline 10-11) -Patient had a fairly traumatic tracheostomy placement with some blood loss and has been on heparin -Currently no evidence of bleeding  Right lower lobe atelecatsis vs aspiration pneumonia -was placed on vancomycin (discontinued on 11/30) and Zosyn (discontinued 12/3)  Hypomagnesemia -Magnesium 2 after replacement on last check  Hypocalcemia -corrected calcium WNL -continue to monitor   Hypokalemia -WNL on last check.  Acute encephalopathy -Resolved  Depression/anxiety -Continue cymbalta and ativan  OSA -Continue trach  History of DVT/PE -Restart on xarelto  Protein calorie malnutrition -was on tube feeds, however, patient pulled out her NG tube -Speech therapy consulted, now on dysphagia 3 diet  Diabetes mellitus, type II -Continue prior to admission medication regimen.  Essential hypertension - Stable will continue metoprolol, spironolactone  Hypothyroidism - Continue Synthroid  Physical deconditioning - PT/OT  consulted, recommended CIR - CIR consulted and patient potential candidate  DVT Prophylaxis  heparin  Code Status: Full  Family Communication: Husband at bedside  Disposition Plan:  dispo pending- CIR consulted   Procedures:  none  Consultations:  Pulmonology   GI  Discharge Exam: Vitals:   06/23/17 1135 06/23/17 1151  BP:  (!) 174/87  Pulse:  70  Resp:  17  Temp: 98 F (36.7 C)   SpO2: 99% 94%    General: pt in nad, alert and awake Cardiovascular: no cyanosis Respiratory: no increased wob, no wheezes  Discharge Instructions   Discharge Instructions    Call MD for:  severe uncontrolled pain   Complete by:  As directed    Call MD for:  temperature >100.4   Complete by:  As directed    Diet - low sodium heart healthy   Complete by:  As directed    Discharge instructions   Complete by:  As directed    Please ensure follow up with your pulmonologist.   Increase activity slowly   Complete by:  As directed      Allergies as of 06/23/2017      Reactions   Allopurinol Nausea And Vomiting      Medication List    STOP taking these medications   amoxicillin-clavulanate 875-125 MG tablet Commonly known as:  AUGMENTIN   furosemide 40 MG tablet Commonly known as:  LASIX     TAKE these medications   calcium-vitamin D 500-200 MG-UNIT tablet Commonly known as:  OSCAL 500/200 D-3 Take 1 tablet 2 (two) times daily by mouth.   DULoxetine 60 MG capsule Commonly known as:  CYMBALTA Take 60 mg by mouth daily.   levETIRAcetam 750 MG tablet Commonly known as:  KEPPRA Take 1 tablet (750 mg total) by mouth 2 (two) times daily.   levothyroxine 100 MCG tablet Commonly known as:  SYNTHROID, LEVOTHROID Take 100 mcg by mouth daily before breakfast.   metFORMIN 500 MG tablet Commonly known as:  GLUCOPHAGE Take 500 mg by mouth 2 (two) times daily with a meal.   metoprolol 200 MG 24 hr tablet Commonly known as:  TOPROL-XL Take 100 mg by mouth at bedtime.    montelukast 10 MG tablet Commonly known as:  SINGULAIR Take 10 mg at bedtime by mouth.   multivitamin with minerals Tabs tablet Take 1 tablet daily by mouth.   omeprazole 40 MG capsule Commonly known as:  PRILOSEC Take 40 mg by mouth 2 (two) times daily.   rivaroxaban 20 MG Tabs tablet Commonly known as:  XARELTO Take 1 tablet (20 mg total) by mouth daily with supper. What changed:    medication strength  how much to take  when to take this   spironolactone 50 MG tablet Commonly known as:  ALDACTONE Take 1 tablet (50 mg total) by mouth daily. Start taking on:  06/24/2017 What changed:  medication strength   ULORIC 40 MG tablet Generic drug:  febuxostat Take 40 mg daily by mouth.      Allergies  Allergen Reactions  . Allopurinol Nausea And Vomiting      The results of significant diagnostics from this hospitalization (including imaging, microbiology, ancillary and laboratory) are listed below for reference.    Significant Diagnostic Studies: Ct Head Wo Contrast  Result Date: 06/19/2017 CLINICAL DATA:  Initial evaluation for under witnessed fall. EXAM: CT HEAD  WITHOUT CONTRAST CT CERVICAL SPINE WITHOUT CONTRAST TECHNIQUE: Multidetector CT imaging of the head and cervical spine was performed following the standard protocol without intravenous contrast. Multiplanar CT image reconstructions of the cervical spine were also generated. COMPARISON:  Prior CT from 06/06/2017. FINDINGS: CT HEAD FINDINGS Brain: Atrophy with chronic small vessel ischemic disease. No acute intracranial hemorrhage. No evidence for acute large vessel territory infarct. No mass lesion, midline shift or mass effect. No hydrocephalus. No extra-axial fluid collection. Vascular: No hyperdense vessel. Scattered vascular calcifications noted within the carotid siphons. Skull: Scalp soft tissues and calvarium within normal limits. Sinuses/Orbits: Globes normal soft tissues normal. Paranasal sinuses clear.  Small bilateral mastoid effusions. CT CERVICAL SPINE FINDINGS Alignment: Study degraded by motion artifact. Reversal of the normal cervical lordosis, apex at C5. Trace anterolisthesis of C7 on T1. Skull base and vertebrae: Skullbase intact. Normal C1-2 articulations are preserved in the dens is intact. Vertebral body heights maintained. No acute fracture. Soft tissues and spinal canal: Soft tissues of the neck demonstrate no acute abnormality. No prevertebral edema. Spinal canal within normal limits. Tracheostomy tube partially visualized. 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 Upper chest: Partially visualized upper chest without acute abnormality. Left IJ approach centra venous catheter partially visualized. Partially visualized lung apices grossly clear. IMPRESSION: 1. No acute intracranial process. 2. Moderate cerebral atrophy with chronic small vessel ischemic disease. 3. No acute traumatic injury within cervical spine. 4. 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 Electronically Signed   By: Jeannine Boga M.D.   On: 06/19/2017 04:58   Ct Cervical Spine Wo Contrast  Result Date: 06/19/2017 CLINICAL DATA:  Initial evaluation for under witnessed fall. EXAM: CT HEAD WITHOUT CONTRAST CT CERVICAL SPINE WITHOUT CONTRAST TECHNIQUE: Multidetector CT imaging of the head and cervical spine was performed following the standard protocol without intravenous contrast. Multiplanar CT image reconstructions of the cervical spine were also generated. COMPARISON:  Prior CT from 06/06/2017. FINDINGS: CT HEAD FINDINGS Brain: Atrophy with chronic small vessel ischemic disease. No acute intracranial hemorrhage. No evidence for acute large vessel territory infarct. No mass lesion, midline shift or mass effect. No hydrocephalus. No extra-axial fluid collection. Vascular: No hyperdense vessel. Scattered vascular calcifications noted within the carotid siphons. Skull: Scalp soft  tissues and calvarium within normal limits. Sinuses/Orbits: Globes normal soft tissues normal. Paranasal sinuses clear. Small bilateral mastoid effusions. CT CERVICAL SPINE FINDINGS Alignment: Study degraded by motion artifact. Reversal of the normal cervical lordosis, apex at C5. Trace anterolisthesis of C7 on T1. Skull base and vertebrae: Skullbase intact. Normal C1-2 articulations are preserved in the dens is intact. Vertebral body heights maintained. No acute fracture. Soft tissues and spinal canal: Soft tissues of the neck demonstrate no acute abnormality. No prevertebral edema. Spinal canal within normal limits. Tracheostomy tube partially visualized. 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 Upper chest: Partially visualized upper chest without acute abnormality. Left IJ approach centra venous catheter partially visualized. Partially visualized lung apices grossly clear. IMPRESSION: 1. No acute intracranial process. 2. Moderate cerebral atrophy with chronic small vessel ischemic disease. 3. No acute traumatic injury within cervical spine. 4. 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 Electronically Signed   By: Jeannine Boga M.D.   On: 06/19/2017 04:58   Mr Brain Wo Contrast  Result Date: 06/09/2017 CLINICAL DATA:  Focal neurologic deficit. EXAM: MRI HEAD WITHOUT CONTRAST TECHNIQUE: Multiplanar, multiecho pulse sequences of the brain and surrounding structures were obtained without intravenous contrast. COMPARISON:  Head CT  06/06/2017 FINDINGS: Brain: The midline structures are normal. There is no acute infarct or acute hemorrhage. No mass lesion, hydrocephalus, dural abnormality or extra-axial collection. There is multifocal white matter hyperintensity suggesting chronic ischemic microangiopathy. Volume loss is mildly asymmetric with greatest atrophy in the left frontal and parietal lobes. No chronic microhemorrhage or superficial siderosis. Vascular:  Major intracranial arterial and venous sinus flow voids are preserved. Skull and upper cervical spine: The visualized skull base, calvarium, upper cervical spine and extracranial soft tissues are normal. Sinuses/Orbits: Bilateral mastoid effusions. Paranasal sinuses are clear. Normal orbits. IMPRESSION: 1. No acute abnormality. 2. Chronic ischemic microangiopathy. 3. Advanced cerebral volume loss that is asymmetrically worse on the left to a mild degree. Electronically Signed   By: Ulyses Jarred M.D.   On: 06/09/2017 23:39   Ct Abdomen Pelvis W Contrast  Result Date: 05/27/2017 CLINICAL DATA:  Nausea vomiting for 4 days.  Mid abdominal pain. EXAM: CT ABDOMEN AND PELVIS WITH CONTRAST TECHNIQUE: Multidetector CT imaging of the abdomen and pelvis was performed using the standard protocol following bolus administration of intravenous contrast. CONTRAST:  162m ISOVUE-300 IOPAMIDOL (ISOVUE-300) INJECTION 61% COMPARISON:  None. FINDINGS: Lower chest: No acute abnormality. Rim calcified mass within the left breast, query postsurgical. Hepatobiliary: No focal liver abnormality is seen. Status post cholecystectomy. No biliary dilatation. Pancreas: Unremarkable. No pancreatic ductal dilatation or surrounding inflammatory changes. Spleen: Normal in size without focal abnormality. Adrenals/Urinary Tract: Intermediate density 1.9 cm right adrenal nodule. Kidneys are normal, without renal calculi, focal lesion, or hydronephrosis. Bladder is unremarkable. Stomach/Bowel: Mild mucosal thickening of the distal gastric body, image 41/92, sequence 2. No evidence of small-bowel obstruction. Normal appendix. Extensive colonic diverticulosis, particularly of the sigmoid colon. Mucosal thickening of the sigmoid colon may represent an element of diverticulitis. Vascular/Lymphatic: Aortic atherosclerosis. No enlarged abdominal or pelvic lymph nodes. Reproductive: Status post hysterectomy. No adnexal masses. Other: No abdominal wall  hernia or abnormality. No abdominopelvic ascites. Musculoskeletal: Multilevel osteoarthritic changes of the spine. Left total hip arthroplasty. IMPRESSION: Indeterminate 1.9 cm right adrenal nodule. If further evaluation is clinically desired, abdominal CT or MRI, adrenal protocol should be followed. Circumferential mucosal thickening of the distal gastric body. This may represent inflammatory changes/ hypertrophy of the gastric mucosa. Infiltrating mass however is on the differential diagnosis. Extensive colonic diverticulosis with probable sigmoid diverticulitis. Please correlate to results of colonoscopy to exclude underlying mass. Electronically Signed   By: DFidela SalisburyM.D.   On: 05/27/2017 15:53   Dg Chest Port 1 View  Result Date: 06/14/2017 CLINICAL DATA:  Pneumonia EXAM: PORTABLE CHEST 1 VIEW COMPARISON:  06/13/2017 FINDINGS: Tracheostomy remains in good position unchanged. Central venous catheter tip in the SVC is unchanged. No pneumothorax. Feeding tube has been placed in the interval and enters the stomach with the tip not visualized. Bibasilar airspace disease left greater than right is unchanged. Small left effusion. Negative for edema. IMPRESSION: No significant change from yesterday. Support lines remain in good position. Bibasilar airspace disease left greater than right with small left effusion unchanged. Electronically Signed   By: CFranchot GalloM.D.   On: 06/14/2017 07:34   Dg Chest Port 1 View  Result Date: 06/13/2017 CLINICAL DATA:  Pneumonia EXAM: PORTABLE CHEST 1 VIEW COMPARISON:  06/12/2017 FINDINGS: Tracheostomy and left central line remain in place, unchanged. Cardiomegaly. Mild vascular congestion. Bibasilar opacities have improved since prior study. No visible effusions or acute bony abnormality. IMPRESSION: Improving bibasilar atelectasis or infiltrates. Cardiomegaly, vascular congestion. Electronically Signed   By: KLennette Bihari  [place] M.D.   On: 06/13/2017 09:38   Dg  Chest Port 1 View  Result Date: 06/12/2017 CLINICAL DATA:  Tracheostomy tube placement EXAM: PORTABLE CHEST 1 VIEW COMPARISON:  06/10/2017 FINDINGS: Tracheostomy tube with the tip 4.5 cm above the carina. Left jugular central venous catheter at the confluence of the brachiocephalic vein and SVC. Small bilateral pleural effusions. Mild right basilar airspace disease likely reflecting atelectasis. No pneumothorax. Stable cardiomediastinal silhouette. No acute osseous abnormality. Severe osteoarthritis of bilateral glenohumeral joints. IMPRESSION: 1. Tracheostomy tube with the tip 4.5 cm above the carina. 2. Left jugular central venous catheter at the confluence of the brachiocephalic vein and SVC. Electronically Signed   By: Kathreen Devoid   On: 06/12/2017 10:48   Dg Chest Port 1 View  Result Date: 06/10/2017 CLINICAL DATA:  Central line placement EXAM: PORTABLE CHEST 1 VIEW COMPARISON:  06/07/2017 FINDINGS: Endotracheal tube with tip measuring 4.8 cm above the carina. Enteric tube tip is off the field of view but below the left hemidiaphragm. Left central venous catheter is positioned over the confluence of the brachiocephalic vein and superior vena cava with tip directed sideways, perpendicular to the vessel wall. Mild cardiac enlargement. No vascular congestion or edema. No pneumothorax. Severe degenerative changes in the shoulders. IMPRESSION: Left central venous catheter projects over the confluence of the brachiocephalic vein and superior vena cava with tip directed laterally, possibly against the vascular side wall. Lungs are clear. No pneumothorax. Electronically Signed   By: Lucienne Capers M.D.   On: 06/10/2017 23:50   Dg Chest Port 1 View  Result Date: 06/07/2017 CLINICAL DATA:  Endotracheal tube position EXAM: PORTABLE CHEST 1 VIEW COMPARISON:  06/06/2017 FINDINGS: Endotracheal tube in good position.  NG in the stomach. Mild progression of bibasilar atelectasis/ infiltrate. Negative for heart  failure. Small left effusion IMPRESSION: Endotracheal tube remains in good position. Progression of bibasilar airspace disease left greater than right Electronically Signed   By: Franchot Gallo M.D.   On: 06/07/2017 06:54   Dg Chest Portable 1 View  Result Date: 06/06/2017 CLINICAL DATA:  Initial evaluation for intubation. EXAM: PORTABLE CHEST 1 VIEW COMPARISON:  Prior radiograph from 05/27/2017. FINDINGS: Endotracheal tube in place with tip positioned 4.3 cm above the carina. Mild cardiomegaly, stable. Mediastinal silhouette normal. Lungs mildly hypoinflated. Patchy left basilar opacity may reflect atelectasis or infiltrate. No pulmonary edema. No definite pleural effusion. No pneumothorax. No acute osseus abnormality. Severe degenerative changes about the shoulders bilaterally. IMPRESSION: 1. Tip of the endotracheal tube well positioned 4.3 cm above the carina. 2. Shallow lung inflation with patchy left basilar opacity. Atelectasis is favored. Electronically Signed   By: Jeannine Boga M.D.   On: 06/06/2017 15:18   Dg Chest Port 1 View  Result Date: 05/27/2017 CLINICAL DATA:  Hypotension with dizziness EXAM: PORTABLE CHEST 1 VIEW COMPARISON:  CT chest March 31, 2013 FINDINGS: There is a calcified granuloma in the right lower lobe. There is no edema or consolidation. Heart size and pulmonary vascularity are within normal limits. No adenopathy. There is aortic atherosclerosis. There is advanced arthropathy in both shoulders. IMPRESSION: No edema or consolidation.  Calcified granuloma right lower lobe. Heart size within normal limits.  There is aortic atherosclerosis. Advanced arthropathy in both shoulders. Aortic Atherosclerosis (ICD10-I70.0). Electronically Signed   By: Lowella Grip III M.D.   On: 05/27/2017 13:02   Dg Abd Portable 1v  Result Date: 06/08/2017 CLINICAL DATA:  Repositioning of orogastric tube. EXAM: PORTABLE ABDOMEN - 1 VIEW COMPARISON:  Earlier the same date. FINDINGS:  1208 hours. No significant change in position of the orogastric tube. The tube projects below the diaphragm, tip in the fundal region. The visualized bowel gas pattern is normal. Mildly increased density at the left lung base and probable left pleural effusion. IMPRESSION: No significant change in position of the orogastric tube, tip overlapping the proximal stomach. Electronically Signed   By: Richardean Sale M.D.   On: 06/08/2017 12:25   Dg Abd Portable 1v  Result Date: 06/08/2017 CLINICAL DATA:  Orogastric tube placement. EXAM: PORTABLE ABDOMEN - 1 VIEW COMPARISON:  Radiographs 06/06/2017.  CT 05/27/2017. FINDINGS: 1126 hours. The orogastric tube is looped over the left upper quadrant of the abdomen, tip likely in the proximal stomach. The visualized bowel gas pattern is normal. Cholecystectomy clips and a convex right lumbar scoliosis are noted. IMPRESSION: Orogastric tube appears looped in the proximal stomach. Electronically Signed   By: Richardean Sale M.D.   On: 06/08/2017 11:38   Dg Abd Portable 1v  Result Date: 06/06/2017 CLINICAL DATA:  Orogastric tube placement. EXAM: PORTABLE ABDOMEN - 1 VIEW COMPARISON:  None. FINDINGS: Patient is rotated to the right. Orogastric tube is seen with tip overlying the distal stomach. No evidence of dilated bowel loops. Surgical clips from prior cholecystectomy. Lumbar spine degenerative changes and left hip prosthesis noted. IMPRESSION: Orogastric tube tip overlies the distal stomach. Electronically Signed   By: Earle Gell M.D.   On: 06/06/2017 22:08   Dg Swallowing Func-speech Pathology  Result Date: 06/17/2017 Objective Swallowing Evaluation: Type of Study: MBS-Modified Barium Swallow Study  Patient Details Name: Amanda Short MRN: [id number] Date of Birth: 1940/07/22 Today's Date: 06/17/2017 Time: SLP Start Time (ACUTE ONLY): 1155 -SLP Stop Time (ACUTE ONLY): 1220 SLP Time Calculation (min) (ACUTE ONLY): 25 min Past Medical History: Past Medical  History: Diagnosis Date . Arthritis   "back, hands" (07/22/2015) . Cancer of left breast ([place]) 2006  S/P lumpectomy . Complication of anesthesia   "brief breathing problem at surgery center in ~ 2005 when I had gallbladder OR" . Depression  . DVT (deep venous thrombosis) ([place]) 03/2013  LLE . GERD (gastroesophageal reflux disease)  . Hyperlipidemia  . Hypertension  . Hypothyroidism  . OSA treated with BiPAP  . Pulmonary embolism ([place]) 03/2013 . Thyroid disease   Hypothyroid . Type II diabetes mellitus ([place])  Past Surgical History: Past Surgical History: Procedure Laterality Date . ABDOMINAL HYSTERECTOMY  1970s . BREAST BIOPSY Left 2006 . BREAST LUMPECTOMY Left 2006 . BUNIONECTOMY WITH HAMMERTOE RECONSTRUCTION Left  . ESOPHAGOGASTRODUODENOSCOPY N/A 05/29/2017  Procedure: ESOPHAGOGASTRODUODENOSCOPY (EGD);  Surgeon: Irene Shipper, MD;  Location: [facility];  Service: Endoscopy;  Laterality: N/A; . JOINT REPLACEMENT   . LAPAROSCOPIC CHOLECYSTECTOMY  ~ 2005 . TOTAL HIP ARTHROPLASTY Left 2010 . TOTAL KNEE ARTHROPLASTY Bilateral 2005-2006 HPI: 76 y.o.female with history of depression, DVT with pulmonary embolism, OSA, and diabetes mellitus type 2 presented on 11/21 with altered mentation, status elipticus.  MRI without obvious focal source. ETT 11/21; extubated 11/27, but with immediate stridor/vocal fold edema; unable to reintubate and required emergent trach.  Subjective: The patient was seen in radiology.  Assessment / Plan / Recommendation CHL IP CLINICAL IMPRESSIONS 06/17/2017 Clinical Impression MBS was completed using thin liquids, nectar thick liquids, honey thick liquids, pureed material and solids.  The patient presented with a mild pharyngeal dysphagia characterized by delayed initiation of the swallow which lead to flash penetration just prior to the swallow given thin and nectar thick liquids.  This material was observed to completely clear the laryngeal vestibule.  Chin tuck was attempted and ineffective to  prevent.  Prominent cricopharyngeus was noted that was not causing any functional issues.  Esophageal sweep did not reveal overt issues.  Recommend a regular diet with thin liquids.  The patient needs to be totally upright for all intake and should take one, small sip at a time.  NO straws and medications should be whole in pureed material.  ST will follow up for therapeutic diet tolerance and swallowing therapy.   SLP Visit Diagnosis Dysphagia, pharyngeal phase (R13.13) Attention and concentration deficit following -- Frontal lobe and executive function deficit following -- Impact on safety and function Mild aspiration risk   CHL IP TREATMENT RECOMMENDATION 06/17/2017 Treatment Recommendations Therapy as outlined in treatment plan below   Prognosis 06/17/2017 Prognosis for Safe Diet Advancement Good Barriers to Reach Goals -- Barriers/Prognosis Comment -- [facility] IP DIET RECOMMENDATION 06/17/2017 SLP Diet Recommendations Regular solids;Thin liquid Liquid Administration via Cup;No straw Medication Administration Whole meds with puree Compensations Slow rate;Small sips/bites Postural Changes Seated upright at 90 degrees   CHL IP OTHER RECOMMENDATIONS 06/17/2017 Recommended Consults -- Oral Care Recommendations Oral care BID Other Recommendations --   [facility] IP FOLLOW UP RECOMMENDATIONS 06/17/2017 Follow up Recommendations Inpatient Rehab   [facility] IP FREQUENCY AND DURATION 06/17/2017 Speech Therapy Frequency (ACUTE ONLY) min 3x week Treatment Duration 2 weeks      [facility] IP ORAL PHASE 06/17/2017 Oral Phase WFL Oral - Pudding Teaspoon -- Oral - Pudding Cup -- Oral - Honey Teaspoon -- Oral - Honey Cup -- Oral - Nectar Teaspoon -- Oral - Nectar Cup -- Oral - Nectar Straw -- Oral - Thin Teaspoon -- Oral - Thin Cup -- Oral - Thin Straw -- Oral - Puree -- Oral - Mech Soft -- Oral - Regular -- Oral - Multi-Consistency -- Oral - Pill -- Oral Phase - Comment --  [facility] IP PHARYNGEAL PHASE 06/17/2017 Pharyngeal Phase Impaired Pharyngeal- Pudding  Teaspoon -- Pharyngeal -- Pharyngeal- Pudding Cup -- Pharyngeal -- Pharyngeal- Honey Teaspoon -- Pharyngeal -- Pharyngeal- Honey Cup Delayed swallow initiation-vallecula Pharyngeal -- Pharyngeal- Nectar Teaspoon -- Pharyngeal -- Pharyngeal- Nectar Cup Delayed swallow initiation-pyriform sinuses Pharyngeal -- Pharyngeal- Nectar Straw -- Pharyngeal -- Pharyngeal- Thin Teaspoon Delayed swallow initiation-pyriform sinuses;Penetration/Aspiration before swallow Pharyngeal Material enters airway, remains ABOVE vocal cords then ejected out Pharyngeal- Thin Cup Delayed swallow initiation-pyriform sinuses;Penetration/Aspiration before swallow Pharyngeal Material enters airway, remains ABOVE vocal cords then ejected out Pharyngeal- Thin Straw Delayed swallow initiation-pyriform sinuses;Penetration/Aspiration before swallow Pharyngeal Material enters airway, remains ABOVE vocal cords then ejected out Pharyngeal- Puree -- Pharyngeal -- Pharyngeal- Mechanical Soft -- Pharyngeal -- Pharyngeal- Regular -- Pharyngeal -- Pharyngeal- Multi-consistency -- Pharyngeal -- Pharyngeal- Pill -- Pharyngeal -- Pharyngeal Comment --  Shelly Flatten, MA, CCC-SLP Acute Rehab SLP [phone] Lamar Sprinkles 06/17/2017, 12:35 PM              Ct Head Code Stroke Wo Contrast`  Result Date: 06/06/2017 CLINICAL DATA:  Code stroke. 76 year old female with right side weakness. Status post seizure. EXAM: CT HEAD WITHOUT CONTRAST TECHNIQUE: Contiguous axial images were obtained from the base of the skull through the vertex without intravenous contrast. COMPARISON:  Head CT without contrast 04/02/2013 and earlier. FINDINGS: Brain: Asymmetric chronic left frontal lobe and bilateral perisylvian and parietal lobe cerebral volume loss appears stable to mildly progressed since 2014. Mildly increased ventricular size since that time is likely ex vacuo. Patchy bilateral periventricular white matter hypodensity  appears stable. No transependymal edema suspected.  No acute intracranial hemorrhage identified. No cortically based acute infarct identified. No midline shift, mass effect, or evidence of intracranial mass lesion. Vascular: Calcified atherosclerosis at the skull base. No suspicious intracranial vascular hyperdensity. Skull: Chronic hyperostosis of the calvarium. No acute osseous abnormality identified. Sinuses/Orbits: Visualized paranasal sinuses and mastoids are stable and well pneumatized. Other: Visualized orbit soft tissues are within normal limits. No acute scalp soft tissue findings, mild chronic right posterior convexity scarring at the site of a 02/2013 scalp hematoma. Negative visualized noncontrast deep soft tissue spaces of the face. ASPECTS [facility] Stroke Program Early CT Score) - Ganglionic level infarction (caudate, lentiform nuclei, internal capsule, insula, M1-M3 cortex): 7 - Supraganglionic infarction (M4-M6 cortex): 3 Total score (0-10 with 10 being normal): 10 IMPRESSION: 1. No acute cortically based infarct or intracranial hemorrhage identified. 2. ASPECTS is 10. 3. Increased chronic nonspecific cerebral volume loss since 2014. 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 4. The above was relayed via text pager to Dr. Bruce Donath on 06/06/2017 at 14:39 . Electronically Signed   By: Genevie Ann M.D.   On: 06/06/2017 14:39    Microbiology: No results found for this or any previous visit (from the past 240 hour(s)).   Labs: Basic Metabolic Panel: Recent Labs  Lab 06/17/17 0353 06/18/17 1209 06/19/17 1055 06/20/17 0300 06/22/17 1322  NA 139 140 141 138 136  K 3.8 3.6 3.4* 3.0* 4.0  CL 100* 100* 93* 89* 92*  CO2 27 33* 36* 38* 35*  GLUCOSE 136* 195* 149* 148* 210*  BUN 8 7 5* <5* 7  CREATININE 0.75 0.75 0.81 0.70 0.75  CALCIUM 8.4* 8.7* 8.4* 8.2* 9.0  MG 2.0  --  1.3* 2.1  --    Liver Function Tests: No results for input(s): AST, ALT, ALKPHOS, BILITOT, PROT, ALBUMIN in the last 168 hours. No results for input(s):  LIPASE, AMYLASE in the last 168 hours. No results for input(s): AMMONIA in the last 168 hours. CBC: Recent Labs  Lab 06/17/17 0353 06/18/17 1209 06/19/17 1055 06/20/17 0300  WBC 9.2 10.5 12.1* 8.6  HGB 9.8* 9.2* 9.8* 9.4*  HCT 32.1* 29.7* 32.1* 30.2*  MCV 101.9* 100.0 100.0 100.3*  PLT 418* 444* 429* 362   Cardiac Enzymes: No results for input(s): CKTOTAL, CKMB, CKMBINDEX, TROPONINI in the last 168 hours. BNP: BNP (last 3 results) No results for input(s): BNP in the last 8760 hours.  ProBNP (last 3 results) No results for input(s): PROBNP in the last 8760 hours.  CBG: Recent Labs  Lab 06/22/17 1139 06/22/17 1554 06/22/17 2120 06/23/17 0805 06/23/17 1135  GLUCAP 275* 240* 149* 141* 227*     Signed:  Velvet Bathe MD.  [facility] 06/23/2017, 2:28 PM

## 2017-06-23 NOTE — Progress Notes (Signed)
Pt transferred to 4W CIR.  Lorazepam discontinued by Dr. Wendee Beavers on discharge.  Rehab RN, Seth Bake, notified of discontinuation - patient has been dependent on Lorazepam for anxiety issues.  AVS printed, discharge signed by husband, and copy given to patient upon discharge from [facility].  PIV remained intact on discharge.

## 2017-06-24 ENCOUNTER — Inpatient Hospital Stay (HOSPITAL_COMMUNITY): Payer: Medicare Other

## 2017-06-24 ENCOUNTER — Inpatient Hospital Stay (HOSPITAL_COMMUNITY): Payer: Medicare Other | Admitting: Occupational Therapy

## 2017-06-24 DIAGNOSIS — E119 Type 2 diabetes mellitus without complications: Secondary | ICD-10-CM

## 2017-06-24 DIAGNOSIS — Z90710 Acquired absence of both cervix and uterus: Secondary | ICD-10-CM

## 2017-06-24 DIAGNOSIS — Z86711 Personal history of pulmonary embolism: Secondary | ICD-10-CM

## 2017-06-24 LAB — COMPREHENSIVE METABOLIC PANEL
ALBUMIN: 2.8 g/dL — AB (ref 3.5–5.0)
ALK PHOS: 72 U/L (ref 38–126)
ALT: 14 U/L (ref 14–54)
AST: 10 U/L — AB (ref 15–41)
Anion gap: 8 (ref 5–15)
BILIRUBIN TOTAL: 0.7 mg/dL (ref 0.3–1.2)
BUN: 5 mg/dL — AB (ref 6–20)
CALCIUM: 9.2 mg/dL (ref 8.9–10.3)
CO2: 36 mmol/L — ABNORMAL HIGH (ref 22–32)
Chloride: 96 mmol/L — ABNORMAL LOW (ref 101–111)
Creatinine, Ser: 0.86 mg/dL (ref 0.44–1.00)
GFR calc Af Amer: >60 mL/min (ref >60)
GFR calc non Af Amer: >60 mL/min (ref >60)
GLUCOSE: 174 mg/dL — AB (ref 65–99)
Potassium: 4.2 mmol/L (ref 3.5–5.1)
Sodium: 140 mmol/L (ref 135–145)
TOTAL PROTEIN: 5.4 g/dL — AB (ref 6.5–8.1)

## 2017-06-24 LAB — CBC WITH DIFFERENTIAL/PLATELET
BASOS ABS: 0.1 10*3/uL (ref 0.0–0.1)
BASOS PCT: 1 %
EOS PCT: 6 %
Eosinophils Absolute: 0.5 10*3/uL (ref 0.0–0.7)
HEMATOCRIT: 35.5 % — AB (ref 36.0–46.0)
HEMOGLOBIN: 10.9 g/dL — AB (ref 12.0–15.0)
Lymphocytes Relative: 17 %
Lymphs Abs: 1.5 10*3/uL (ref 0.7–4.0)
MCH: 30.5 pg (ref 26.0–34.0)
MCHC: 30.7 g/dL (ref 30.0–36.0)
MCV: 99.4 fL (ref 78.0–100.0)
MONO ABS: 1 10*3/uL (ref 0.1–1.0)
MONOS PCT: 12 %
NEUTROS ABS: 5.6 10*3/uL (ref 1.7–7.7)
Neutrophils Relative %: 64 %
Platelets: 356 10*3/uL (ref 150–400)
RBC: 3.57 MIL/uL — ABNORMAL LOW (ref 3.87–5.11)
RDW: 14.4 % (ref 11.5–15.5)
WBC: 8.7 10*3/uL (ref 4.0–10.5)

## 2017-06-24 LAB — GLUCOSE, CAPILLARY
Glucose-Capillary: 135 mg/dL — ABNORMAL HIGH (ref 65–99)
Glucose-Capillary: 156 mg/dL — ABNORMAL HIGH (ref 65–99)
Glucose-Capillary: 165 mg/dL — ABNORMAL HIGH (ref 65–99)
Glucose-Capillary: 222 mg/dL — ABNORMAL HIGH (ref 65–99)

## 2017-06-24 NOTE — Plan of Care (Signed)
LTGs established 06/24/17

## 2017-06-24 NOTE — Evaluation (Signed)
Physical Therapy Assessment and Plan  Patient Details  Name: Amanda Short MRN: [id number] Date of Birth: 12/15/1940  PT Diagnosis: Difficulty walking and Muscle weakness Rehab Potential: Good ELOS: 10-14 days   Today's Date: 06/24/2017 PT Individual Time: 1100-1200 PT Individual Time Calculation (min): 60 min    Problem List:  Patient Active Problem List   Diagnosis Date Noted  . Physical debility 06/23/2017  . 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 History of acute respiratory failure   . Tracheostomy, acute management ([place])   . Aspiration pneumonia ([place])   . Encephalopathy   . Status epilepticus ([place])   . Benign essential HTN   . 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 History of pulmonary embolism   . Acute blood loss anemia   . Diverticulitis of colon   . Tachypnea   . Encounter for orogastric (OG) tube placement   . Encounter for central line placement   . Encounter for attention to tracheostomy ([place])   . Acute respiratory failure with hypoxia ([place])   . Acute encephalopathy 06/06/2017  . Diverticulitis of large intestine without perforation or abscess without bleeding   . Abdominal pain   . Non-intractable cyclical vomiting with nausea   . Abnormal CT of the abdomen   . Hypocalcemia 05/27/2017  . 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 Colitis 05/27/2017  . Diabetic foot infection ([place]) 07/21/2015  . Diabetes mellitus type 2, controlled ([place]) 07/21/2015  . Cellulitis of foot, right 07/21/2015  . Cellulitis of right foot 07/21/2015  . Acute pulmonary embolism ([place]) 04/01/2013  . DVT (deep venous thrombosis) ([place]) 04/01/2013  . HX: breast cancer 04/01/2013  . Concussion 04/01/2013  . Diabetes ([place]) 04/01/2013  . Essential hypertension, benign 04/01/2013  . Unspecified hypothyroidism 04/01/2013  . OSA on CPAP 04/01/2013    Past Medical History:  Past Medical History:  Diagnosis Date  . Arthritis    "back, hands" (07/22/2015)  . Cancer of left breast  ([place]) 2006   S/P lumpectomy  . Complication of anesthesia    "brief breathing problem at surgery center in ~ 2005 when I had gallbladder OR"  . Depression   . DVT (deep venous thrombosis) ([place]) 03/2013   LLE  . GERD (gastroesophageal reflux disease)   . Hyperlipidemia   . Hypertension   . Hypothyroidism   . OSA treated with BiPAP   . Pulmonary embolism ([place]) 03/2013  . Thyroid disease    Hypothyroid  . Type II diabetes mellitus ([place])    Past Surgical History:  Past Surgical History:  Procedure Laterality Date  . ABDOMINAL HYSTERECTOMY  1970s  . BREAST BIOPSY Left 2006  . BREAST LUMPECTOMY Left 2006  . BUNIONECTOMY WITH HAMMERTOE RECONSTRUCTION Left   . ESOPHAGOGASTRODUODENOSCOPY N/A 05/29/2017   Procedure: ESOPHAGOGASTRODUODENOSCOPY (EGD);  Surgeon: Irene Shipper, MD;  Location: [facility];  Service: Endoscopy;  Laterality: N/A;  . JOINT REPLACEMENT    . LAPAROSCOPIC CHOLECYSTECTOMY  ~ 2005  . TOTAL HIP ARTHROPLASTY Left 2010  . TOTAL KNEE ARTHROPLASTY Bilateral 2005-2006    Assessment & Plan Clinical Impression: Patient is a 76 y.o. year old female with history of HTN, T2DM, h/o PE, recent bout with diverticulitis 11/11to 05/30/17; who was admitted on 06/06/17 with AMS with weakness left side and left gaze deviation. Stat CT negativereviewed, unremarkable for acute process. Seizure activity in Xray with difficulty protecting airway and was intubated. She was loaded with keppra and required multiple doses IV ativan due to status epilepticus. Low  Ca/Mg levels supplemented and LTM EEG showed evidence of severe diffuse encephalopathy. Hospital course significant for agitation requiring precedex as well as restraints, inability to tolerate extubation requiring emergent tracheostomy at bedside due to stridor/marked swelling of VC on 06/12/17. She was stated on IV antibiotics for aspiration PNA andhas beenweaned to ATC.   Swallow evaluation done and she was started on regular diet  with recommendations to downgrade to thickened liquids in case of respiratory decline.She has been tolerating regular diet but continues to have desaturations at nights. Trach downsizing was held due to higher oxygen needs of 60% Fio2. 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 She continues to have cognitive deficits with decreased balance and hypoxia affecting activity. CIR recommended due to functional deficits.     Patient transferred to CIR on 06/23/2017 .   Patient currently requires min with mobility secondary to muscle weakness, decreased cardiorespiratoy endurance and decreased oxygen support and decreased standing balance and decreased balance strategies.  Prior to hospitalization, patient was modified independent  with mobility and lived with Spouse in a   home.  Home access is  Level entry.  Patient will benefit from skilled PT intervention to maximize safe functional mobility, minimize fall risk and decrease caregiver burden for planned discharge home with intermittent assist.  Anticipate patient will benefit from follow up OP at discharge.  PT - End of Session Activity Tolerance: Tolerates 10 - 20 min activity with multiple rests Endurance Deficit: Yes PT Assessment Rehab Potential (ACUTE/IP ONLY): Good PT Patient demonstrates impairments in the following area(s): Balance;Endurance;Motor PT Transfers Functional Problem(s): Bed Mobility;Bed to Chair;Car;Furniture PT Locomotion Functional Problem(s): Ambulation;Wheelchair Mobility;Stairs PT Plan PT Intensity: Minimum of 1-2 x/day ,45 to 90 minutes PT Frequency: 5 out of 7 days PT Duration Estimated Length of Stay: 10-14 days PT Treatment/Interventions: Ambulation/gait training;Discharge planning;Functional mobility training;Therapeutic Activities;Balance/vestibular training;Neuromuscular re-education;Therapeutic Exercise;Cognitive  remediation/compensation;UE/LE Strength taining/ROM;Splinting/orthotics;DME/adaptive equipment instruction;Community reintegration;Functional electrical stimulation;Patient/family education;Stair training;UE/LE Coordination activities PT Transfers Anticipated Outcome(s): supervision  PT Locomotion Anticipated Outcome(s): Supervision  PT Recommendation Recommendations for Other Services: Therapeutic Recreation consult Follow Up Recommendations: Outpatient PT Patient destination: Home Equipment Recommended: To be determined  Skilled Therapeutic Intervention Evaluation completed (see details above and below) with education on PT POC and goals and individual treatment initiated with focus on functional transfers, gait, and activity tolerance. Pt supine in bed upon PT arrival, agreeable to therapy tx and denies pain. Pt on 8L of O2 throughout session with SpO2 87-96% with all OOB activities. Pt transferred supine>sitting EOB with min assist. Pt transferred from bed>w/c using RW stand pivot and min assist. Pt transported to gym in w/c. Pt ambulated x 30 ft using RW and min assist, verbal cues for increased step length, pt limited by fatigue and decreased endurance. Pt performed car transfer using RW and min assist. Pt overall requiring min assist for all mobility, limited by decreased endurance and requiring increased rest breaks to allow SpO2 recovery after activity. Pt left seated in w/c at end of session with needs in reach.,    PT Evaluation Precautions/Restrictions Precautions Precautions: Fall Precaution Comments: trach Restrictions Weight Bearing Restrictions: No General   Vital SignsTherapy Vitals Pulse Rate: 72 Resp: 18 Patient Position (if appropriate): Lying Oxygen Therapy SpO2: 98 % O2 Device: Tracheostomy Collar O2 Flow Rate (L/min): 8 L/min FiO2 (%): 35 % Home Living/Prior Functioning Home Living Available Help at Discharge: Family;Available 24 hours/day Home Access: Level  entry Home Layout: One level  Lives With: Spouse Prior Function Level of Independence: Independent with basic ADLs;Independent with gait Driving: No Vocation: Retired Leisure: Hobbies-yes (Comment) Comments: enjoys arts/crafts, baking and cooking Cognition Overall Cognitive Status: Within Functional Limits for tasks assessed Orientation Level: Oriented X4 Attention: Sustained Sustained Attention: Appears intact Memory: Impaired Awareness: Appears intact Safety/Judgment: Appears intact Sensation Sensation Light Touch: Appears Intact Proprioception: Appears Intact Additional Comments: B LE sensation intact  Coordination Gross Motor Movements are Fluid and Coordinated: Yes Fine Motor Movements are Fluid and Coordinated: Yes Heel Shin Test: [facility] Motor  Motor Motor - Skilled Clinical Observations: generalized weakness  Trunk/Postural Assessment  Cervical Assessment Cervical Assessment: Within Functional Limits Thoracic Assessment Thoracic Assessment: Within Functional Limits Lumbar Assessment Lumbar Assessment: Within Functional Limits Postural Control Postural Control: Within Functional Limits  Balance Balance Balance Assessed: Yes Static Sitting Balance Static Sitting - Level of Assistance: 5: Stand by assistance Dynamic Sitting Balance Dynamic Sitting - Level of Assistance: 5: Stand by assistance Static Standing Balance Static Standing - Level of Assistance: 4: Min assist Dynamic Standing Balance Dynamic Standing - Level of Assistance: 4: Min assist Extremity Assessment  RLE Assessment RLE Assessment: Exceptions to WFL(grossly 4/5 throughout) LLE Assessment LLE Assessment: Exceptions to WFL(grossly 4/5 throughout)   See Function Navigator for Current Functional Status.   Refer to Care Plan for Long Term Goals  Recommendations for other services: Therapeutic Recreation  Outing/community reintegration  Discharge Criteria: Patient will be discharged from PT if  patient refuses treatment 3 consecutive times without medical reason, if treatment goals not met, if there is a change in medical status, if patient makes no progress towards goals or if patient is discharged from hospital.  The above assessment, treatment plan, treatment alternatives and goals were discussed and mutually agreed upon: by patient  Netta Corrigan, PT, DPT 06/24/2017, 12:58 PM

## 2017-06-24 NOTE — Progress Notes (Signed)
Subjective/Complaints:   Objective: Vital Signs: Blood pressure (!) 160/88, pulse 72, temperature 98.4 F (36.9 C), temperature source Oral, resp. rate 18, weight 96.2 kg (212 lb 2.9 oz), SpO2 93 %. No results found. Results for orders placed or performed during the hospital encounter of 06/23/17 (from the past 72 hour(s))  Glucose, capillary     Status: Abnormal   Collection Time: 06/23/17  5:16 PM  Result Value Ref Range   Glucose-Capillary 127 (H) 65 - 99 mg/dL  Glucose, capillary     Status: Abnormal   Collection Time: 06/23/17  8:03 PM  Result Value Ref Range   Glucose-Capillary 191 (H) 65 - 99 mg/dL  CBC WITH DIFFERENTIAL     Status: Abnormal   Collection Time: 06/24/17  5:07 AM  Result Value Ref Range   WBC 8.7 4.0 - 10.5 K/uL   RBC 3.57 (L) 3.87 - 5.11 MIL/uL   Hemoglobin 10.9 (L) 12.0 - 15.0 g/dL   HCT 35.5 (L) 36.0 - 46.0 %   MCV 99.4 78.0 - 100.0 fL   MCH 30.5 26.0 - 34.0 pg   MCHC 30.7 30.0 - 36.0 g/dL   RDW 14.4 11.5 - 15.5 %   Platelets 356 150 - 400 K/uL   Neutrophils Relative % 64 %   Neutro Abs 5.6 1.7 - 7.7 K/uL   Lymphocytes Relative 17 %   Lymphs Abs 1.5 0.7 - 4.0 K/uL   Monocytes Relative 12 %   Monocytes Absolute 1.0 0.1 - 1.0 K/uL   Eosinophils Relative 6 %   Eosinophils Absolute 0.5 0.0 - 0.7 K/uL   Basophils Relative 1 %   Basophils Absolute 0.1 0.0 - 0.1 K/uL  Comprehensive metabolic panel     Status: Abnormal   Collection Time: 06/24/17  5:07 AM  Result Value Ref Range   Sodium 140 135 - 145 mmol/L   Potassium 4.2 3.5 - 5.1 mmol/L   Chloride 96 (L) 101 - 111 mmol/L   CO2 36 (H) 22 - 32 mmol/L   Glucose, Bld 174 (H) 65 - 99 mg/dL   BUN 5 (L) 6 - 20 mg/dL   Creatinine, Ser 0.86 0.44 - 1.00 mg/dL   Calcium 9.2 8.9 - 10.3 mg/dL   Total Protein 5.4 (L) 6.5 - 8.1 g/dL   Albumin 2.8 (L) 3.5 - 5.0 g/dL   AST 10 (L) 15 - 41 U/L   ALT 14 14 - 54 U/L   Alkaline Phosphatase 72 38 - 126 U/L   Total Bilirubin 0.7 0.3 - 1.2 mg/dL   GFR calc non Af  Amer >60 >60 mL/min   GFR calc Af Amer >60 >60 mL/min    Comment: (NOTE) The eGFR has been calculated using the CKD EPI equation. This calculation has not been validated in all clinical situations. eGFR's persistently <60 mL/min signify possible Chronic Kidney Disease.    Anion gap 8 5 - 15  Glucose, capillary     Status: Abnormal   Collection Time: 06/24/17  6:20 AM  Result Value Ref Range   Glucose-Capillary 135 (H) 65 - 99 mg/dL     HEENT: trach #6 cuffed [place], small amt eschar peri stoma 9:00 to 3:00 Cardio: RRR and no murmur Resp: CTA B/L and unlabored GI: BS positive and NT, ND Extremity:  Pulses positive and No Edema Skin:   Intact and Other trach site as above Neuro: Alert/Oriented, Normal Sensory and Abnormal Motor 4/5 in BUE and BLE Musc/Skel:  Normal Gen Anxious   Assessment/Plan:  1. Functional deficits secondary to Debilty after VDRF which require 3+ hours per day of interdisciplinary therapy in a comprehensive inpatient rehab setting. Physiatrist is providing close team supervision and 24 hour management of active medical problems listed below. Physiatrist and rehab team continue to assess barriers to discharge/monitor patient progress toward functional and medical goals. FIM:       Function - Toileting Toileting activity did not occur: No continent bowel/bladder event           Function - Comprehension Comprehension: Auditory Comprehension assist level: Understands basic 75 - 89% of the time/ requires cueing 10 - 24% of the time  Function - Expression Expression: Verbal Expression assistive device: Talk trach valve Expression assist level: Expresses basic 75 - 89% of the time/requires cueing 10 - 24% of the time. Needs helper to occlude trach/needs to repeat words.  Function - Social Interaction Social Interaction assist level: Interacts appropriately 75 - 89% of the time - Needs redirection for appropriate language or to initiate  interaction.  Function - Problem Solving Problem solving assist level: Solves basic 75 - 89% of the time/requires cueing 10 - 24% of the time  Function - Memory Memory assist level: Recognizes or recalls 50 - 74% of the time/requires cueing 25 - 49% of the time Patient normally able to recall (first 3 days only): Current season, Location of own room, That he or she is in a hospital, Staff names and faces  Medical Problem List and Plan: 1. Debility secondary to VDRF PT. OT evals today 2. DVT Prophylaxis/Anticoagulation: Pharmaceutical: Xarelto 3. Pain Management: acetaminophen 4. Mood: very anxious change IV ativan to po and wean as pt improve, needs neuropsych eval 5. Neuropsych: This patient is capable of making decisions on her own behalf. 6. Skin/Wound Care: Prophyllactic foam dressing to sacrum 7. Fluids/Electrolytes/Nutrition: carb mod diet 8. 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 Change to po, no seizures since rehab admit 9. VDRF: Continue ATC with pulmonary hygiene. Wean trach to #4 cuffless as per trach team 10. 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  11. Anxiety disorder: Continue ativan prn    LOS (Days) 1 A FACE TO FACE EVALUATION WAS PERFORMED  Amanda Short 06/24/2017, 11:49 AM

## 2017-06-24 NOTE — Progress Notes (Signed)
Patient BBS had some slight exp wheezing. Pt working with rehab and wanted to wait until they were done to get breathing treatment. RT will check with patient at a later time to give treatment. Vitals stable, patient in no distress, able to speak in full sentences, unlabored.

## 2017-06-24 NOTE — Evaluation (Signed)
Occupational Therapy Assessment and Plan  Patient Details  Name: Amanda Short MRN: [id number] Date of Birth: 12/27/1940  OT Diagnosis: abnormal posture, cognitive deficits and muscle weakness (generalized) Rehab Potential: Rehab Potential (ACUTE ONLY): Excellent ELOS: 2.5-3 weeks    Today's Date: 06/24/2017 OT Individual Time: 4010-2725 OT Individual Time Calculation (min): 64 min    Problem List:  Patient Active Problem List   Diagnosis Date Noted  . Physical debility 06/23/2017  . 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 History of acute respiratory failure   . Tracheostomy, acute management ([place])   . Aspiration pneumonia ([place])   . Encephalopathy   . Status epilepticus ([place])   . Benign essential HTN   . 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 History of pulmonary embolism   . Acute blood loss anemia   . Diverticulitis of colon   . Tachypnea   . Encounter for orogastric (OG) tube placement   . Encounter for central line placement   . Encounter for attention to tracheostomy ([place])   . Acute respiratory failure with hypoxia ([place])   . Acute encephalopathy 06/06/2017  . Diverticulitis of large intestine without perforation or abscess without bleeding   . Abdominal pain   . Non-intractable cyclical vomiting with nausea   . Abnormal CT of the abdomen   . Hypocalcemia 05/27/2017  . 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 Colitis 05/27/2017  . Diabetic foot infection ([place]) 07/21/2015  . Diabetes mellitus type 2, controlled ([place]) 07/21/2015  . Cellulitis of foot, right 07/21/2015  . Cellulitis of right foot 07/21/2015  . Acute pulmonary embolism ([place]) 04/01/2013  . DVT (deep venous thrombosis) ([place]) 04/01/2013  . HX: breast cancer 04/01/2013  . Concussion 04/01/2013  . Diabetes ([place]) 04/01/2013  . Essential hypertension, benign 04/01/2013  . Unspecified hypothyroidism 04/01/2013  . OSA on CPAP 04/01/2013    Past Medical History:  Past Medical History:  Diagnosis  Date  . Arthritis    "back, hands" (07/22/2015)  . Cancer of left breast ([place]) 2006   S/P lumpectomy  . Complication of anesthesia    "brief breathing problem at surgery center in ~ 2005 when I had gallbladder OR"  . Depression   . DVT (deep venous thrombosis) ([place]) 03/2013   LLE  . GERD (gastroesophageal reflux disease)   . Hyperlipidemia   . Hypertension   . Hypothyroidism   . OSA treated with BiPAP   . Pulmonary embolism ([place]) 03/2013  . Thyroid disease    Hypothyroid  . Type II diabetes mellitus ([place])    Past Surgical History:  Past Surgical History:  Procedure Laterality Date  . ABDOMINAL HYSTERECTOMY  1970s  . BREAST BIOPSY Left 2006  . BREAST LUMPECTOMY Left 2006  . BUNIONECTOMY WITH HAMMERTOE RECONSTRUCTION Left   . ESOPHAGOGASTRODUODENOSCOPY N/A 05/29/2017   Procedure: ESOPHAGOGASTRODUODENOSCOPY (EGD);  Surgeon: Irene Shipper, MD;  Location: [facility];  Service: Endoscopy;  Laterality: N/A;  . JOINT REPLACEMENT    . LAPAROSCOPIC CHOLECYSTECTOMY  ~ 2005  . TOTAL HIP ARTHROPLASTY Left 2010  . TOTAL KNEE ARTHROPLASTY Bilateral 2005-2006    Assessment & Plan Clinical Impression: Amanda Harkleroadis a 76 y.o.femalewith history of HTN, T2DM, h/o PE, recent bout with diverticulitis 11/11to 05/30/17; who was admitted on 06/06/17 with AMS with weakness left side and left gaze deviation. Stat CT negativereviewed, unremarkable for acute process. Seizure activity in Xray with difficulty protecting airway and was intubated. She was loaded with keppra and required multiple doses IV ativan due  to status epilepticus. Low Ca/Mg levels supplemented and LTM EEG showed evidence of severe diffuse encephalopathy. Hospital course significant for agitation requiring precedex as well as restraints, inability to tolerate extubation requiring emergent tracheostomy at bedside due to stridor/marked swelling of VC on 06/12/17. She was stated on IV antibiotics for aspiration PNA andhas beenweaned  to ATC.   Swallow evaluation done and she was started on regular diet with recommendations to downgrade to thickened liquids in case of respiratory decline.She has been tolerating regular diet but continues to have desaturations at nights. Trach downsizing was held due to higher oxygen needs of 60% Fio2. 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 She continues to have cognitive deficits with decreased balance and hypoxia affecting activity.   Patient currently requires max with basic self-care skills secondary to muscle weakness, decreased cardiorespiratoy endurance, decreased safety awareness, decreased memory and delayed processing and decreased sitting balance, decreased standing balance and decreased postural control.  Prior to hospitalization, patient could complete BADLs with min.  Patient will benefit from skilled intervention to increase independence with basic self-care skills prior to discharge home with care partner.  Anticipate patient will require 24 hour supervision and follow up home health.  OT - End of Session Endurance Deficit: Yes OT Assessment Rehab Potential (ACUTE ONLY): Excellent OT Barriers to Discharge: Trach;New oxygen OT Patient demonstrates impairments in the following area(s): Balance;Cognition;Safety;Endurance;Motor OT Basic ADL's Functional Problem(s): Grooming;Bathing;Dressing;Toileting OT Advanced ADL's Functional Problem(s): Simple Meal Preparation OT Transfers Functional Problem(s): Toilet;Tub/Shower OT Additional Impairment(s): None OT Plan OT Intensity: Minimum of 1-2 x/day, 45 to 90 minutes OT Frequency: 5 out of 7 days OT Duration/Estimated Length of Stay: 2.5-3 weeks OT Treatment/Interventions: Balance/vestibular training;Discharge planning;Pain management;Self Care/advanced ADL retraining;Therapeutic Activities;UE/LE Coordination activities;Cognitive  remediation/compensation;Disease mangement/prevention;Functional mobility training;Patient/family education;Skin care/wound managment;Therapeutic Exercise;Visual/perceptual remediation/compensation;Wheelchair propulsion/positioning;UE/LE Strength taining/ROM;Splinting/orthotics;Psychosocial support;DME/adaptive equipment instruction;Neuromuscular re-education;Community reintegration OT Self Feeding Anticipated Outcome(s): No goal OT Basic Self-Care Anticipated Outcome(s): Supervision OT Toileting Anticipated Outcome(s): Supervision OT Bathroom Transfers Anticipated Outcome(s): Supervision OT Recommendation Recommendations for Other Services: Therapeutic Recreation consult Therapeutic Recreation Interventions: Pet therapy Patient destination: Home Follow Up Recommendations: Home health OT Equipment Recommended: To be determined  Skilled Therapeutic Intervention Skilled OT session completed with focus on initial evaluation, pt/spouse education on OT role/POC, and establishment of patient centered goals.   Pt greeted asleep in bed, easily woken. Her PMSV was donned and spouse Sid present. 02 sats on supplemental 02 96% via trach collar at rest. No c/o pain. She transitioned to EOB with Mod A and engaged in bathing/dressing EOB. Pt requiring Mod A for sit<stands for OT to complete pericare and elevate pants. She also required assist for reaching bilateral feet due to hx bilateral TKAs and Lt THA (limiting functional LE ROM). She reports her husband assisted with LB ADLs at home 4 years PTA, but verbalized she wants to learn how to do LB self care herself while at [facility]. 02 sats dropping into 70s-80s during all sit<stands and during functional stand pivot transfer to [facility] (with Mod A). Pt with no s/s distress or SOB at these times. She was able to increase 02 sats back to low 90s after 3 minutes while sitting and breathing deeply. Pt also with Lt lean in unsupported sitting that became more pronounced with  time/fatigue. Bilateral [facility] WFL for opening hygiene packages, manipulating grooming items, and spraying on favorite perfume. Pt returned to supine with Max A, 2  helpers for boosting pt up in bed. She was left with all needs within reach, [facility] elevated to 30 degrees, spouse present, and bed alarm activated.   OT Evaluation Precautions/Restrictions  Precautions Precautions: Fall Precaution Comments: trach + PMSV, monitor 02 Restrictions Weight Bearing Restrictions: No General Chart Reviewed: Yes Family/Caregiver Present: Yes(Spouse Sid) Vital Signs Therapy Vitals Temp: 97.7 F (36.5 C) Temp Source: Oral Pulse Rate: 64 Resp: 18 BP: (!) 149/79 Patient Position (if appropriate): Lying Oxygen Therapy SpO2: 93 % O2 Device: Tracheostomy Collar O2 Flow Rate (L/min): 8 L/min FiO2 (%): 35 % Pain No c/o pain during tx    Home Living/Prior Functioning Home Living Family/patient expects to be discharged to:: Private residence Living Arrangements: Spouse/significant other Available Help at Discharge: Family, Available 24 hours/day Type of Home: House Home Access: Level entry Home Layout: One level Bathroom Shower/Tub: Optometrist: Yes  Lives With: Spouse IADL History Homemaking Responsibilities: Yes(Spouse assisted with driving) Meal Prep Responsibility: Primary Laundry Responsibility: Primary Cleaning Responsibility: Primary Bill Paying/Finance Responsibility: No Shopping Responsibility: No Occupation: Retired Type of Occupation: Network engineer Leisure and Hobbies: Crocheting/crafts, taking care of her cats Prior Function Level of Independence: Needs assistance with ADLs, Independent with gait, Requires assistive device for independence, Independent with homemaking with ambulation Driving: No(Spouse assists) ADL ADL ADL Comments: Please see functional navigator for ADL status Vision Patient Visual Report: No change from  baseline Vision Assessment?: No apparent visual deficits Perception  Perception: Within Functional Limits Praxis Praxis: Impaired Praxis Impairment Details: Motor planning Cognition Overall Cognitive Status: Within Functional Limits for tasks assessed Arousal/Alertness: Awake/alert Orientation Level: Person;Place;Situation Person: Oriented Place: Oriented Situation: Oriented Year: 2018 Month: October Day of Week: Incorrect Memory: Impaired Immediate Memory Recall: Sock;Blue;Bed Memory Recall: (0/3) Attention: Sustained Sustained Attention: Appears intact Awareness: Appears intact Problem Solving: Impaired Safety/Judgment: Appears intact Sensation Sensation Light Touch: Appears Intact Proprioception: Appears Intact Coordination Gross Motor Movements are Fluid and Coordinated: Yes Fine Motor Movements are Fluid and Coordinated: Yes Motor  Motor Motor: Abnormal postural alignment and control;Other (comment) Motor - Skilled Clinical Observations: global weakness and decreased cardiorespiratory endurance Mobility  Transfers Transfers: Sit to Stand;Stand to Sit Sit to Stand: From toilet;3: Mod assist Stand to Sit: To toilet;3: Mod assist  Trunk/Postural Assessment  Cervical Assessment Cervical Assessment: Within Functional Limits Thoracic Assessment Thoracic Assessment: (Lt lean in sitting while bathing EOB) Lumbar Assessment Lumbar Assessment: Within Functional Limits Postural Control Postural Control: Deficits on evaluation  Balance Balance Balance Assessed: Yes Dynamic Sitting Balance Dynamic Sitting - Level of Assistance: 4: Min assist Sitting balance - Comments: Attempting to thread LEs into pants EOB Dynamic Standing Balance Dynamic Standing - Level of Assistance: 3: Mod assist(Completing frontal perihygiene during bathing) Extremity/Trunk Assessment RUE Assessment RUE Assessment: Within Functional Limits LUE Assessment LUE Assessment: Within Functional  Limits   See Function Navigator for Current Functional Status.   Refer to Care Plan for Long Term Goals  Recommendations for other services: Therapeutic Recreation  Pet therapy  Discharge Criteria: Patient will be discharged from OT if patient refuses treatment 3 consecutive times without medical reason, if treatment goals not met, if there is a change in medical status, if patient makes no progress towards goals or if patient is discharged from hospital.  The above assessment, treatment plan, treatment alternatives and goals were discussed and mutually agreed upon: by patient and by family  Skeet Simmer 06/24/2017, 5:09 PM

## 2017-06-24 NOTE — Progress Notes (Addendum)
+/-  sleep. PRN trazodone given at 2223. Anxious at times, PRN ativan given at 0627. One attempt to get OOB without assist. Bed alarm in use. O2 Sats mid 80's-98% with O2 at 5L at 28% TC. Coughing up tan thick sputum. Suctioned by RT. Inner cannula changed. Min. Assist stand pivot to [facility]. Continent X 1 and incontinent X 1 of urine. Amanda Short  Unstageable pressure wound  noted to 12 o'clock area of trach stoma. Refused to wear split gauze, reports feeling like she's choking when she wears the gauze. Dr. Letta Pate made aware.Amanda Short

## 2017-06-25 ENCOUNTER — Inpatient Hospital Stay (HOSPITAL_COMMUNITY): Payer: Medicare Other | Admitting: Speech Pathology

## 2017-06-25 ENCOUNTER — Inpatient Hospital Stay (HOSPITAL_COMMUNITY): Payer: Medicare Other

## 2017-06-25 ENCOUNTER — Inpatient Hospital Stay (HOSPITAL_COMMUNITY): Payer: Medicare Other | Admitting: Occupational Therapy

## 2017-06-25 DIAGNOSIS — Z930 Tracheostomy status: Secondary | ICD-10-CM

## 2017-06-25 LAB — GLUCOSE, CAPILLARY
Glucose-Capillary: 162 mg/dL — ABNORMAL HIGH (ref 65–99)
Glucose-Capillary: 165 mg/dL — ABNORMAL HIGH (ref 65–99)
Glucose-Capillary: 160 mg/dL — ABNORMAL HIGH (ref 65–99)
Glucose-Capillary: 159 mg/dL — ABNORMAL HIGH (ref 65–99)

## 2017-06-25 NOTE — IPOC Note (Signed)
Overall Plan of Care [facility]) Patient Details Name: Kaijah Abts MRN: [id number] DOB: 1941/05/15  Admitting Diagnosis: Physical debility  Hospital Problems: Principal Problem:   Physical debility Active Problems:   Diabetes mellitus type 2 in nonobese [facility])   History of pulmonary embolism   History of acute respiratory failure   Status post trachelectomy     Functional Problem List: Nursing Endurance, Medication Management, Safety, Skin Integrity  PT Balance, Endurance, Motor  OT Balance, Cognition, Safety, Endurance, Motor  SLP Cognition  TR         Basic ADL's: OT Grooming, Bathing, Dressing, Toileting     Advanced  ADL's: OT Simple Meal Preparation     Transfers: PT Bed Mobility, Bed to Chair, Car, Manufacturing systems engineer, Metallurgist: PT Ambulation, Emergency planning/management officer, Stairs     Additional Impairments: OT None  SLP Communication expression    TR      Anticipated Outcomes Item Anticipated Outcome  Self Feeding No goal  Swallowing      Basic self-care  Supervision  Toileting  Supervision   Bathroom Transfers Supervision  Bowel/Bladder  Patient continue to continent of bowel and bladder.  Transfers  supervision   Locomotion  Supervision   Communication  Mod I for recall of new information   Cognition  Supervision with complex tasks   Pain  Patient to be pain free or pain less than 3 during admission  Safety/Judgment  Patient will be free from falls and adhere to safty paln   Therapy Plan: PT Intensity: Minimum of 1-2 x/day ,45 to 90 minutes PT Frequency: 5 out of 7 days PT Duration Estimated Length of Stay: 10-14 days OT Intensity: Minimum of 1-2 x/day, 45 to 90 minutes OT Frequency: 5 out of 7 days OT Duration/Estimated Length of Stay: 2.5-3 weeks SLP Intensity: Minumum of 1-2 x/day, 30 to 90 minutes SLP Frequency: 3 to 5 out of 7 days SLP Duration/Estimated Length of Stay: 14-16 days     Team Interventions: Nursing  Interventions Pain Management, Skin Care/Wound Management, Dysphagia/Aspiration Precaution Training, Disease Management/Prevention, Medication Management  PT interventions Ambulation/gait training, Discharge planning, Functional mobility training, Therapeutic Activities, Balance/vestibular training, Neuromuscular re-education, Therapeutic Exercise, Cognitive remediation/compensation, UE/LE Strength taining/ROM, Splinting/orthotics, DME/adaptive equipment instruction, Community reintegration, Technical sales engineer stimulation, Patient/family education, IT trainer, UE/LE Coordination activities  OT Interventions Training and development officer, Discharge planning, Pain management, Self Care/advanced ADL retraining, Therapeutic Activities, UE/LE Coordination activities, Cognitive remediation/compensation, Disease mangement/prevention, Functional mobility training, Patient/family education, Skin care/wound managment, Therapeutic Exercise, Visual/perceptual remediation/compensation, Wheelchair propulsion/positioning, UE/LE Strength taining/ROM, Splinting/orthotics, Psychosocial support, DME/adaptive equipment instruction, Neuromuscular re-education, Community reintegration  SLP Interventions Cognitive remediation/compensation, Patient/family education, English as a second language teacher, Medication managment, Environmental controls, Internal/external aids, Speech/Language facilitation  TR Interventions    SW/CM Interventions Discharge Planning, Psychosocial Support, Patient/Family Education   Barriers to Discharge MD  Medical stability  Nursing      PT      OT Trach, New oxygen    SLP Trach spouse would like for patient to be decannulated prior to discharge  SW       Team Discharge Planning: Destination: PT-Home ,OT- Home , SLP-Home Projected Follow-up: PT-Outpatient PT, OT-  Home health OT, SLP-24 hour supervision/assistance Projected Equipment Needs: PT-To be determined, OT- To be determined, SLP-To be  determined Equipment Details: PT- , OT-  Patient/family involved in discharge planning: PT- Patient, Family member/caregiver,  OT-Patient, Family member/caregiver, SLP-Patient, Family member/caregiver  MD ELOS: 12-14 days Medical Rehab Prognosis:  Excellent Assessment: The patient  has been admitted for CIR therapies with the diagnosis of debility. The team will be addressing functional mobility, strength, stamina, balance, safety, adaptive techniques and equipment, self-care, bowel and bladder mgt, patient and caregiver education, cognition, mood control, neuromuscular reeducation, activity tolerance, trach management and communication. Goals have been set at supervision for mobility, self-care/ADLs, cognition and communication.    Meredith Staggers, MD, FAAPMR   See Team Conference Notes for weekly updates to the plan of care

## 2017-06-25 NOTE — Progress Notes (Signed)
Nutrition Brief Note  Patient identified on the Malnutrition Screening Tool (MST) Report.  Wt Readings from Last 15 Encounters:  06/25/17 211 lb 10.3 oz (96 kg)  06/23/17 211 lb 10.3 oz (96 kg)  05/28/17 158 lb 15.2 oz (72.1 kg)  07/21/15 177 lb (80.3 kg)  03/31/13 179 lb 14.4 oz (81.6 kg)   Body mass index is 39.99 kg/m. Patient meets criteria for Obesity Class II based on current BMI.   During acute hospitalization pt had a small bore feeding tube (ie Cortrak) with short term nutrition support.  Pt currently on a Carbohydrate Modified diet and is consuming approximately 75-100% of meals at this time.   Labs and medications reviewed. CBG's [phone].  No nutrition interventions warranted at this time. If nutrition issues arise, please consult RD.   Arthur Holms, RD, LDN Pager #: [phone] After-Hours Pager #: [phone]

## 2017-06-25 NOTE — Progress Notes (Signed)
Social Work  Social Work Assessment and Plan  Patient Details  Name: Amanda Short MRN: [id number] Date of Birth: [birth year]  Today's Date: 06/25/2017  Problem List:  Patient Active Problem List   Diagnosis Date Noted  . Anxiety state   . Physical debility 06/23/2017  . 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 History of acute respiratory failure   . Tracheostomy, acute management ([place])   . Aspiration pneumonia ([place])   . Encephalopathy   . Status epilepticus ([place])   . Benign essential HTN   . 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 History of pulmonary embolism   . Acute blood loss anemia   . Diverticulitis of colon   . Tachypnea   . Encounter for orogastric (OG) tube placement   . Encounter for central line placement   . Encounter for attention to tracheostomy ([place])   . Acute respiratory failure with hypoxia ([place])   . Acute encephalopathy 06/06/2017  . Diverticulitis of large intestine without perforation or abscess without bleeding   . Abdominal pain   . Non-intractable cyclical vomiting with nausea   . Abnormal CT of the abdomen   . Hypocalcemia 05/27/2017  . 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 Colitis 05/27/2017  . Diabetic foot infection ([place]) 07/21/2015  . Diabetes mellitus type 2, controlled ([place]) 07/21/2015  . Cellulitis of foot, right 07/21/2015  . Cellulitis of right foot 07/21/2015  . Acute pulmonary embolism ([place]) 04/01/2013  . DVT (deep venous thrombosis) ([place]) 04/01/2013  . HX: breast cancer 04/01/2013  . Concussion 04/01/2013  . Diabetes ([place]) 04/01/2013  . Essential hypertension, benign 04/01/2013  . Unspecified hypothyroidism 04/01/2013  . OSA on CPAP 04/01/2013   Past Medical History:  Past Medical History:  Diagnosis Date  . Arthritis    "back, hands" (07/22/2015)  . Cancer of left breast ([place]) 2006   S/P lumpectomy  . Complication of anesthesia    "brief breathing problem at surgery center in ~ 2005 when I had gallbladder OR"  .  Depression   . DVT (deep venous thrombosis) ([place]) 03/2013   LLE  . GERD (gastroesophageal reflux disease)   . Hyperlipidemia   . Hypertension   . Hypothyroidism   . OSA treated with BiPAP   . Pulmonary embolism ([place]) 03/2013  . Thyroid disease    Hypothyroid  . Type II diabetes mellitus ([place])    Past Surgical History:  Past Surgical History:  Procedure Laterality Date  . ABDOMINAL HYSTERECTOMY  1970s  . BREAST BIOPSY Left 2006  . BREAST LUMPECTOMY Left 2006  . BUNIONECTOMY WITH HAMMERTOE RECONSTRUCTION Left   . ESOPHAGOGASTRODUODENOSCOPY N/A 05/29/2017   Procedure: ESOPHAGOGASTRODUODENOSCOPY (EGD);  Surgeon: Irene Shipper, MD;  Location: [facility];  Service: Endoscopy;  Laterality: N/A;  . JOINT REPLACEMENT    . LAPAROSCOPIC CHOLECYSTECTOMY  ~ 2005  . TOTAL HIP ARTHROPLASTY Left 2010  . TOTAL KNEE ARTHROPLASTY Bilateral 2005-2006   Social History:  reports that she has quit smoking. Her smoking use included cigarettes. She has a 30.00 pack-year smoking history. she has never used smokeless tobacco. She reports that she does not drink alcohol or use drugs.  Family / Support Systems Marital Status: Married How Long?: 98 yrs Patient Roles: Spouse, Parent, Other (Comment)(grandparent) Spouse/Significant Other: Amanda Short @ (C) [phone] Children: son, Amanda Short (lives in [place]) Anticipated Caregiver: Spouse Ability/Limitations of Caregiver: None Caregiver Availability: 24/7 Family Dynamics: Husband very invovled and attentive and ready to provide any amount of assistance needed.  Social  History Preferred language: [place] Religion: Protestant Cultural Background: NA Read: Yes Write: Yes Employment Status: Retired Freight forwarder Issues: None Guardian/Conservator: None - per MD, pt is capable of making decisions on her own behalf.   Abuse/Neglect Abuse/Neglect Assessment Can Be Completed: Yes Physical Abuse: Denies Verbal Abuse: Denies Sexual  Abuse: Denies Exploitation of patient/patient's resources: Denies Self-Neglect: Denies  Emotional Status Pt's affect, behavior adn adjustment status: Very pleasant woman who is able to complete the assessment interview without much difficulty.  She does occasionally ask spouse for assistance with questions.  She notes h/o depression but states her current mood is "pretty good I guess..."  She denies any significant emotional distress.  Will refer for neuropsychology consult as indicated. Recent Psychosocial Issues: None Pyschiatric History: h/o depression but no formal counseling/ treatment - meds prescribed by primary MD Substance Abuse History: NA  Patient / Family Perceptions, Expectations & Goals Pt/Family understanding of illness & functional limitations: Pt and spouse with good, basic awareness of her medical course beginning with seizures and then medical decline to VDRF/ trached.  Good understanding of her current functional limitations/ need for CIR. Premorbid pt/family roles/activities: Pt was completely independent at home and in the community. Anticipated changes in roles/activities/participation: Per goals of supervision, spouse to assume primary caregiver role. Pt/family expectations/goals: Pt hopeful she will be able to get trach out prior to d/c.  Community [employer] Agencies: None Premorbid Home Care/DME Agencies: Other (Comment)(A[facility]) Transportation available at discharge: yes Resource referrals recommended: Neuropsychology  Discharge Planning Living Arrangements: Spouse/significant other Support Systems: Spouse/significant other, Children, Friends/neighbors Type of Residence: Private residence Insurance Resources: Medicare([employer] Medicare) Financial Resources: Radio broadcast assistant Screen Referred: No Living Expenses: Own Money Management: Spouse Does the patient have any problems obtaining your medications?: No Home Management: pt and  spouse Patient/Family Preliminary Plans: Pt to d/c home with spouse providing 24/7 supervision Social Work Anticipated Follow Up Needs: HH/OP Expected length of stay: 2-3 weeks  Clinical Impression Very pleasant woman here following seizure, medical decline with VDRF/ trach. Here with debility and hopeful trach will be out by d/c.  Excellent support from spouse who is able to provide 24/7 assistance.  No significant emotional distress currently but will monitor throughout stay.  Will follow for support and d/c planning needs.  Cindy Fullman 06/25/2017, 10:34 AM

## 2017-06-25 NOTE — Progress Notes (Signed)
Physical Therapy Session Note  Patient Details  Name: Amanda Short MRN: [id number] Date of Birth: June 24, 1941  Today's Date: 06/25/2017 PT Individual Time: 0830-0930 PT Individual Time Calculation (min): 60 min   Short Term Goals: Week 1:  PT Short Term Goal 1 (Week 1): Pt will ambulate 100 ft with RW and min assist PT Short Term Goal 2 (Week 1): Pt will perform bed mobility with supervision  PT Short Term Goal 3 (Week 1): Pt will tolerate 10 minutes of activity OOB without rest break  Skilled Therapeutic Interventions/Progress Updates:    Patient in supine with spouse present.  RT in to address tight collar and changed cannula.  Patient on 40% TC and placed on portable O2.  Transferred sit to stand min A and ambulated to wheelchair with RW and min A.  Patient in w/c attempted to propel with bilateral UE's.  She had difficulty with shoulder mobility so needed min A even few feet she did propel.  Patient assist to stand with RW min A in gym and ambulated about 30' and c/o nausea.  Sat to recover, then ambulated 60-' min A with RW.  Seated rest and negotiated 8 3" steps with rails and min A, but increased time due to dyspnea and stops to catch her breath.  Spouse present and managed O2 tank on stairs.  Patient performed sit <> stand x 5 reps for LE strength and technique.  Needed mod cues for safety.  Assist in w/c back to room and left with all needs in reach and spouse present.   Therapy Documentation Precautions:  Precautions Precautions: Fall Precaution Comments: trach + PMSV, monitor 02 Restrictions Weight Bearing Restrictions: No Pain: Pain Assessment Pain Assessment: No/denies pain   See Function Navigator for Current Functional Status.   Therapy/Group: Individual Therapy  Reginia Naas 06/25/2017, 12:38 PM

## 2017-06-25 NOTE — Evaluation (Signed)
Speech Language Pathology Assessment and Plan  Patient Details  Name: Amanda Short MRN: [id number] Date of Birth: Feb 12, 1941  SLP Diagnosis: Voice disorder;Cognitive Impairments  Rehab Potential: Excellent ELOS: 14-16 days     Today's Date: 06/25/2017 SLP Individual Time: 4098-1191 SLP Individual Time Calculation (min): 60 min   Problem List:  Patient Active Problem List   Diagnosis Date Noted  . Physical debility 06/23/2017  . 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 History of acute respiratory failure   . Tracheostomy, acute management ([place])   . Aspiration pneumonia ([place])   . Encephalopathy   . Status epilepticus ([place])   . Benign essential HTN   . 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 History of pulmonary embolism   . Acute blood loss anemia   . Diverticulitis of colon   . Tachypnea   . Encounter for orogastric (OG) tube placement   . Encounter for central line placement   . Encounter for attention to tracheostomy ([place])   . Acute respiratory failure with hypoxia ([place])   . Acute encephalopathy 06/06/2017  . Diverticulitis of large intestine without perforation or abscess without bleeding   . Abdominal pain   . Non-intractable cyclical vomiting with nausea   . Abnormal CT of the abdomen   . Hypocalcemia 05/27/2017  . 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 Colitis 05/27/2017  . Diabetic foot infection ([place]) 07/21/2015  . Diabetes mellitus type 2, controlled ([place]) 07/21/2015  . Cellulitis of foot, right 07/21/2015  . Cellulitis of right foot 07/21/2015  . Acute pulmonary embolism ([place]) 04/01/2013  . DVT (deep venous thrombosis) ([place]) 04/01/2013  . HX: breast cancer 04/01/2013  . Concussion 04/01/2013  . Diabetes ([place]) 04/01/2013  . Essential hypertension, benign 04/01/2013  . Unspecified hypothyroidism 04/01/2013  . OSA on CPAP 04/01/2013   Past Medical History:  Past Medical History:  Diagnosis Date  . Arthritis    "back, hands" (07/22/2015)  .  Cancer of left breast ([place]) 2006   S/P lumpectomy  . Complication of anesthesia    "brief breathing problem at surgery center in ~ 2005 when I had gallbladder OR"  . Depression   . DVT (deep venous thrombosis) ([place]) 03/2013   LLE  . GERD (gastroesophageal reflux disease)   . Hyperlipidemia   . Hypertension   . Hypothyroidism   . OSA treated with BiPAP   . Pulmonary embolism ([place]) 03/2013  . Thyroid disease    Hypothyroid  . Type II diabetes mellitus ([place])    Past Surgical History:  Past Surgical History:  Procedure Laterality Date  . ABDOMINAL HYSTERECTOMY  1970s  . BREAST BIOPSY Left 2006  . BREAST LUMPECTOMY Left 2006  . BUNIONECTOMY WITH HAMMERTOE RECONSTRUCTION Left   . ESOPHAGOGASTRODUODENOSCOPY N/A 05/29/2017   Procedure: ESOPHAGOGASTRODUODENOSCOPY (EGD);  Surgeon: Irene Shipper, MD;  Location: [facility];  Service: Endoscopy;  Laterality: N/A;  . JOINT REPLACEMENT    . LAPAROSCOPIC CHOLECYSTECTOMY  ~ 2005  . TOTAL HIP ARTHROPLASTY Left 2010  . TOTAL KNEE ARTHROPLASTY Bilateral 2005-2006    Assessment / Plan / Recommendation Clinical Impression Amanda Harkleroadis a 76 y.o.femalewith history of HTN, T2DM, h/o PE, recent bout with diverticulitis 11/11to 05/30/17; who was admitted on 06/06/17 with AMS with weakness left side and left gaze deviation. Stat CT negativereviewed, unremarkable for acute process. Seizure activity in Xray with difficulty protecting airway and was intubated. She was loaded with keppra and required multiple doses IV ativan due to status epilepticus. Low Ca/Mg levels  supplemented and LTM EEG showed evidence of severe diffuse encephalopathy. Hospital course significant for agitation requiring precedex as well as restraints, inability to tolerate extubation requiring emergent tracheostomy at bedside due to stridor/marked swelling of VC on 06/12/17. She was stated on IV antibiotics for aspiration PNA andhas beenweaned to ATC. Swallow evaluation done and  she was started on regular diet with recommendations to downgrade to thickened liquids in case of respiratory decline.She has been tolerating regular diet but continues to have desaturations at nights. Trach downsizing was held due to higher oxygen needs of 60% Fio2. 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 She continues to have cognitive deficits with decreased balance and hypoxia affecting activity. CIR recommended due to functional deficits and patient admitted 06/23/17.  Evaluations completed and patient demonstrates high-level cognitive impairments characterized by decreased recall of new information, slowed processing, and poor self-monitoring and correcting of errors.  Bedside swallow eval revealed no overt s/s of aspiration with oral and pharyngeal stages of swallow.  PMSV eval revealed tolerance and good vocal quality and overall speech intelligibility; however, patient unable to donn or doff valve requiring full supervision until this goal can bo obtained.  Cognition deficits were noted by patient and spouse and of most concern given that they impact the patient's overall safety with functional self-care tasks.  Patient would benefit from skilled SLP intervention in order to maximize her functional independence prior to discharge. Anticipate patient will require 24 hour supervision at home from spouse, Sid and potentially follow up SLP services.    Skilled Therapeutic Interventions          Bedside swallow, PMSV, and Cognitive-linguistic evaluations completed with results and recommendations reviewed with patient and spouse.  Agreeable to goals addressing cognition with recall and processing as well as PMSV.     SLP Assessment  Patient will need skilled Speech Lanaguage Pathology Services during CIR admission    Recommendations  Patient may use Passy-Muir Speech Valve: During PO intake/meals;During  all waking hours (remove during sleep);Caregiver trained to provide supervision PMSV Supervision: Full MD: Please consider changing trach tube to : Cuffless;Smaller size SLP Diet Recommendations: Thin;Age appropriate regular solids Liquid Administration via: Cup;Straw Medication Administration: Whole meds with liquid Supervision: Patient able to self feed Compensations: Slow rate;Small sips/bites Postural Changes and/or Swallow Maneuvers: Seated upright 90 degrees Oral Care Recommendations: Oral care BID Patient destination: Home Follow up Recommendations: 24 hour supervision/assistance Equipment Recommended: To be determined    SLP Frequency 3 to 5 out of 7 days   SLP Duration  SLP Intensity  SLP Treatment/Interventions 14-16 days   Minumum of 1-2 x/day, 30 to 90 minutes  Cognitive remediation/compensation;Patient/family education;Cueing hierarchy;Medication managment;Environmental controls;Internal/external aids;Speech/Language facilitation    Pain Pain Assessment Pain Assessment: No/denies pain Pain Score: 8  Pain Type: Acute pain Pain Location: Head Pain Orientation: Right;Left Pain Descriptors / Indicators: Aching;Headache("sinus headache") Pain Onset: Gradual Pain Intervention(s): Medication (See eMAR)  Prior Functioning Cognitive/Linguistic Baseline: Within functional limits  Function:  Eating Eating   Modified Consistency Diet: No Eating Assist Level: Set up assist for   Eating Set Up Assist For: Opening containers;Cutting food       Cognition Comprehension Comprehension assist level: Understands basic 75 - 89% of the time/ requires cueing 10 - 24% of the time  Expression Expression assistive device: Talk trach valve Expression assist level: Expresses basic needs/ideas: With extra time/assistive device  Social Interaction Social  Interaction assist level: Interacts appropriately 75 - 89% of the time - Needs redirection for appropriate language or to  initiate interaction.  Problem Solving Problem solving assist level: Solves basic 50 - 74% of the time/requires cueing 25 - 49% of the time  Memory Memory assist level: Recognizes or recalls 50 - 74% of the time/requires cueing 25 - 49% of the time   Short Term Goals: Week 1: SLP Short Term Goal 1 (Week 1): Patient will donn and doff PMSV with Min assist verbal and visual cues  SLP Short Term Goal 2 (Week 1): Patient will utilize external aids to assist with recall of new information with Min assist question cues  SLP Short Term Goal 3 (Week 1): Patient will complete complex problem solving tasks with Min assist verbal, question cues to self-monitor and correct errors  Refer to Care Plan for Long Term Goals  Recommendations for other services: None  and Therapeutic Recreation  Pet therapy and [place] group. Patient loves and misses her cats.  Discharge Criteria: Patient will be discharged from SLP if patient refuses treatment 3 consecutive times without medical reason, if treatment goals not met, if there is a change in medical status, if patient makes no progress towards goals or if patient is discharged from hospital.  The above assessment, treatment plan, treatment alternatives and goals were discussed and mutually agreed upon: by patient and by family  Carmelia Roller., [place] 06/25/2017, 3:19 PM

## 2017-06-25 NOTE — Care Management (Signed)
Inpatient Rehabilitation Center Individual Statement of Services  Patient Name:  Amanda Short  Date:  06/25/2017  Welcome to the [facility].  Our goal is to provide you with an individualized program based on your diagnosis and situation, designed to meet your specific needs.  With this comprehensive rehabilitation program, you will be expected to participate in at least 3 hours of rehabilitation therapies Monday-Friday, with modified therapy programming on the weekends.  Your rehabilitation program will include the following services:  Physical Therapy (PT), Occupational Therapy (OT), Speech Therapy (ST), 24 hour per day rehabilitation nursing, Therapeutic Recreaction (TR), Neuropsychology, Case Management (Social Worker), Rehabilitation Medicine, Nutrition Services and Pharmacy Services  Weekly team conferences will be held on Tuesdays to discuss your progress.  Your Social Worker will talk with you frequently to get your input and to update you on team discussions.  Team conferences with you and your family in attendance may also be held.  Expected length of stay: 2-3 weeks  Overall anticipated outcome: supervision  Depending on your progress and recovery, your program may change. Your Social Worker will coordinate services and will keep you informed of any changes. Your Social Worker's name and contact numbers are listed  below.  The following services may also be recommended but are not provided by the [place] will be made to provide these services after discharge if needed.  Arrangements include referral to agencies that provide these services.  Your insurance has been verified to be:  [facility] Medicare Your primary doctor is:  Bernadene Bell, MD  [facility])  Pertinent information will be shared with your doctor and your  insurance company.  Social Worker:  [place], [place] or ([phone]   Information discussed with and copy given to patient by: Lennart Pall, 06/25/2017, 10:35 AM

## 2017-06-25 NOTE — Progress Notes (Signed)
Occupational Therapy Session Note  Patient Details  Name: Amanda Short MRN: [id number] Date of Birth: 1940-10-08  Today's Date: 06/25/2017 OT Individual Time: 0930-1030 OT Individual Time Calculation (min): 60 min    Short Term Goals: Week 1:  OT Short Term Goal 1 (Week 1): Pt will complete LB dressing with Mod A OT Short Term Goal 2 (Week 1): Pt will complete sit<stand during bathing with Min A OT Short Term Goal 3 (Week 1): Pt will complete BSC/toilet transfer with Min A and 02 sats >90%  Skilled Therapeutic Interventions/Progress Updates:    Pt received sitting up in w/c, agreeable to OT tx session. Pt completed bathing/dressing ADLs seated in w/c at sink. Pt requires MinA to wash lower LEs, minA for sit<>stand at sink and mod verbal cues for hand placement during sit<>stand. Pt requires mod verbal cues for correctly orienting shirt prior to donning, requires assist to thread LEs into pants/underwear with Pt able to advance approx 50% over hips while in standing with Min steady assist in standing. Pt on 8L O2 during session, with O2 remaining at 94% and above during session. Pt requesting to return to bed end of session, completes stand pivot with Min HHA for stand pivot transfer w/c to EOB. Pt left supine in bed, bed alarm activated, call bell and needs within reach, spouse present.   Therapy Documentation Precautions:  Precautions Precautions: Fall Precaution Comments: trach + PMSV, monitor 02 Restrictions Weight Bearing Restrictions: No   Pain: Pain Assessment Pain Assessment: No/denies pain Pain Score: 8  Pain Type: Acute pain Pain Location: Head Pain Orientation: Right;Left Pain Descriptors / Indicators: Aching;Headache("sinus headache") Pain Onset: Gradual Pain Intervention(s): Medication (See eMAR) ADL: ADL ADL Comments: Please see functional navigator for ADL status  See Function Navigator for Current Functional Status.   Therapy/Group: Individual  Therapy  Raymondo Band 06/25/2017, 4:01 PM

## 2017-06-25 NOTE — Progress Notes (Signed)
Subjective/Complaints: Had a reasonable night.  Slept well.  Breathing is improving at rest.  Still become short of breath with activities  ROS: pt denies nausea, vomiting, diarrhea, cough, shortness of breath or chest pain   Objective: Vital Signs: Blood pressure 135/68, pulse 66, temperature 98.5 F (36.9 C), temperature source Oral, resp. rate 17, weight 96 kg (211 lb 10.3 oz), SpO2 100 %. No results found. Results for orders placed or performed during the hospital encounter of 06/23/17 (from the past 72 hour(s))  Glucose, capillary     Status: Abnormal   Collection Time: 06/23/17  5:16 PM  Result Value Ref Range   Glucose-Capillary 127 (H) 65 - 99 mg/dL  Glucose, capillary     Status: Abnormal   Collection Time: 06/23/17  8:03 PM  Result Value Ref Range   Glucose-Capillary 191 (H) 65 - 99 mg/dL  CBC WITH DIFFERENTIAL     Status: Abnormal   Collection Time: 06/24/17  5:07 AM  Result Value Ref Range   WBC 8.7 4.0 - 10.5 K/uL   RBC 3.57 (L) 3.87 - 5.11 MIL/uL   Hemoglobin 10.9 (L) 12.0 - 15.0 g/dL   HCT 35.5 (L) 36.0 - 46.0 %   MCV 99.4 78.0 - 100.0 fL   MCH 30.5 26.0 - 34.0 pg   MCHC 30.7 30.0 - 36.0 g/dL   RDW 14.4 11.5 - 15.5 %   Platelets 356 150 - 400 K/uL   Neutrophils Relative % 64 %   Neutro Abs 5.6 1.7 - 7.7 K/uL   Lymphocytes Relative 17 %   Lymphs Abs 1.5 0.7 - 4.0 K/uL   Monocytes Relative 12 %   Monocytes Absolute 1.0 0.1 - 1.0 K/uL   Eosinophils Relative 6 %   Eosinophils Absolute 0.5 0.0 - 0.7 K/uL   Basophils Relative 1 %   Basophils Absolute 0.1 0.0 - 0.1 K/uL  Comprehensive metabolic panel     Status: Abnormal   Collection Time: 06/24/17  5:07 AM  Result Value Ref Range   Sodium 140 135 - 145 mmol/L   Potassium 4.2 3.5 - 5.1 mmol/L   Chloride 96 (L) 101 - 111 mmol/L   CO2 36 (H) 22 - 32 mmol/L   Glucose, Bld 174 (H) 65 - 99 mg/dL   BUN 5 (L) 6 - 20 mg/dL   Creatinine, Ser 0.86 0.44 - 1.00 mg/dL   Calcium 9.2 8.9 - 10.3 mg/dL   Total Protein 5.4  (L) 6.5 - 8.1 g/dL   Albumin 2.8 (L) 3.5 - 5.0 g/dL   AST 10 (L) 15 - 41 U/L   ALT 14 14 - 54 U/L   Alkaline Phosphatase 72 38 - 126 U/L   Total Bilirubin 0.7 0.3 - 1.2 mg/dL   GFR calc non Af Amer >60 >60 mL/min   GFR calc Af Amer >60 >60 mL/min    Comment: (NOTE) The eGFR has been calculated using the CKD EPI equation. This calculation has not been validated in all clinical situations. eGFR's persistently <60 mL/min signify possible Chronic Kidney Disease.    Anion gap 8 5 - 15  Glucose, capillary     Status: Abnormal   Collection Time: 06/24/17  6:20 AM  Result Value Ref Range   Glucose-Capillary 135 (H) 65 - 99 mg/dL  Glucose, capillary     Status: Abnormal   Collection Time: 06/24/17 11:56 AM  Result Value Ref Range   Glucose-Capillary 156 (H) 65 - 99 mg/dL  Glucose, capillary  Status: Abnormal   Collection Time: 06/24/17  4:22 PM  Result Value Ref Range   Glucose-Capillary 165 (H) 65 - 99 mg/dL  Glucose, capillary     Status: Abnormal   Collection Time: 06/24/17  8:57 PM  Result Value Ref Range   Glucose-Capillary 222 (H) 65 - 99 mg/dL  Glucose, capillary     Status: Abnormal   Collection Time: 06/25/17  6:11 AM  Result Value Ref Range   Glucose-Capillary 162 (H) 65 - 99 mg/dL  Glucose, capillary     Status: Abnormal   Collection Time: 06/25/17 11:47 AM  Result Value Ref Range   Glucose-Capillary 165 (H) 65 - 99 mg/dL     HEENT: trach #6 cuffed Shiley, with Passy-Muir valve in place.  Cardio: RRR without murmur. No JVD  Resp: Expiratory wheezes in all fields.  Breathing unlabored GI: BS positive and NT, ND Extremity:   Trace lower extremity edema.  Skin warm, pulses intact Skin:   Intact and Other trach site as above Neuro: Alert/Oriented, Normal Sensory and Abnormal Motor 45 in BUE and BLE Musc/Skel:  Normal Gen Anxious   Assessment/Plan: 1. Functional deficits secondary to Debilty after VDRF which require 3+ hours per day of interdisciplinary therapy in  a comprehensive inpatient rehab setting. Physiatrist is providing close team supervision and 24 hour management of active medical problems listed below. Physiatrist and rehab team continue to assess barriers to discharge/monitor patient progress toward functional and medical goals. FIM: Function - Bathing Position: Sitting EOB Body parts bathed by patient: Right arm, Left arm, Chest, Abdomen, Front perineal area, Right upper leg, Left upper leg Body parts bathed by helper: Buttocks, Right lower leg, Left lower leg, Back  Function- Upper Body Dressing/Undressing What is the patient wearing?: Bra, Pull over shirt/dress Bra - Perfomed by helper: Thread/unthread right bra strap, Thread/unthread left bra strap, Hook/unhook bra (pull down sports bra) Function - Lower Body Dressing/Undressing What is the patient wearing?: Underwear, Pants, Non-skid slipper socks Position: Sitting EOB Underwear - Performed by helper: Thread/unthread right underwear leg, Thread/unthread left underwear leg, Pull underwear up/down Pants- Performed by helper: Thread/unthread right pants leg, Thread/unthread left pants leg, Pull pants up/down Non-skid slipper socks- Performed by helper: Don/doff right sock, Don/doff left sock Assist for footwear: Dependant Assist for lower body dressing: (Total A)  Function - Toileting Toileting activity did not occur: No continent bowel/bladder event Toileting steps completed by patient: Performs perineal hygiene Toileting steps completed by helper: Adjust clothing prior to toileting, Performs perineal hygiene, Adjust clothing after toileting Assist level: Touching or steadying assistance (Pt.75%)  Function - Toilet Transfers Toilet transfer assistive device: Bedside commode Assist level to bedside commode (at bedside): Moderate assist (Pt 50 - 74%/lift or lower) Assist level from bedside commode (at bedside): Moderate assist (Pt 50 - 74%/lift or lower)  Function - Chair/bed  transfer Chair/bed transfer method: Stand pivot Chair/bed transfer assist level: Touching or steadying assistance (Pt > 75%) Chair/bed transfer assistive device: Walker, Armrests Chair/bed transfer details: Verbal cues for safe use of DME/AE, Verbal cues for precautions/safety  Function - Locomotion: Wheelchair Will patient use wheelchair at discharge?: No Type: Manual Max wheelchair distance: 30 Assist Level: Touching or steadying assistance (Pt > 75%) Assist Level: Touching or steadying assistance (Pt > 75%) Wheel 150 feet activity did not occur: Safety/medical concerns Turns around,maneuvers to table,bed, and toilet,negotiates 3% grade,maneuvers on rugs and over doorsills: No Function - Locomotion: Ambulation Assistive device: Walker-rolling Max distance: 65 Assist level: Touching or steadying assistance (  Pt > 75%) Assist level: Touching or steadying assistance (Pt > 75%) Walk 50 feet with 2 turns activity did not occur: Safety/medical concerns Assist level: Touching or steadying assistance (Pt > 75%) Walk 150 feet activity did not occur: Safety/medical concerns Walk 10 feet on uneven surfaces activity did not occur: Safety/medical concerns  Function - Comprehension Comprehension: Auditory Comprehension assist level: Understands basic 75 - 89% of the time/ requires cueing 10 - 24% of the time  Function - Expression Expression: Verbal Expression assistive device: Talk trach valve Expression assist level: Expresses basic needs/ideas: With extra time/assistive device  Function - Social Interaction Social Interaction assist level: Interacts appropriately 75 - 89% of the time - Needs redirection for appropriate language or to initiate interaction.  Function - Problem Solving Problem solving assist level: Solves basic 50 - 74% of the time/requires cueing 25 - 49% of the time  Function - Memory Memory assist level: Recognizes or recalls 50 - 74% of the time/requires cueing 25 -  49% of the time Patient normally able to recall (first 3 days only): Current season, That he or she is in a hospital, Staff names and faces  Medical Problem List and Plan: 1. Debility secondary to VDRF   -Continue inpatient rehab  2. DVT Prophylaxis/Anticoagulation: Pharmaceutical: Xarelto 3. Pain Management: acetaminophen 4. Mood:  Continued ego support fRom team for anxiety    -Ativan prn.  Wean to Xanax eventually 5. Neuropsych: This patient is capable of making decisions on her own behalf. 6. Skin/Wound Care: Prophyllactic foam dressing to sacrum 7. Fluids/Electrolytes/Nutrition: carb mod diet 8. 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 Change to po, no seizures since rehab admit 9. VDRF: Continue ATC with pulmonary hygiene.     - downsize to #4 if she continues to do well.  Continue Passy-Muir valve during the day  10. 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      LOS (Days) 2 A FACE TO FACE EVALUATION WAS PERFORMED  Seaton Hofmann T 06/25/2017, 3:34 PM

## 2017-06-26 ENCOUNTER — Inpatient Hospital Stay (HOSPITAL_COMMUNITY): Payer: Medicare Other | Admitting: Physical Therapy

## 2017-06-26 ENCOUNTER — Inpatient Hospital Stay (HOSPITAL_COMMUNITY): Payer: Medicare Other | Admitting: Occupational Therapy

## 2017-06-26 ENCOUNTER — Inpatient Hospital Stay (HOSPITAL_COMMUNITY): Payer: Medicare Other | Admitting: Speech Pathology

## 2017-06-26 ENCOUNTER — Inpatient Hospital Stay (HOSPITAL_COMMUNITY): Payer: Medicare Other

## 2017-06-26 ENCOUNTER — Encounter (HOSPITAL_COMMUNITY): Payer: Medicare Other | Admitting: Psychology

## 2017-06-26 DIAGNOSIS — F411 Generalized anxiety disorder: Secondary | ICD-10-CM

## 2017-06-26 LAB — BASIC METABOLIC PANEL
ANION GAP: 8 (ref 5–15)
BUN: 9 mg/dL (ref 6–20)
CALCIUM: 9.1 mg/dL (ref 8.9–10.3)
CO2: 34 mmol/L — AB (ref 22–32)
Chloride: 96 mmol/L — ABNORMAL LOW (ref 101–111)
Creatinine, Ser: 0.85 mg/dL (ref 0.44–1.00)
GFR calc Af Amer: >60 mL/min (ref >60)
GFR calc non Af Amer: >60 mL/min (ref >60)
GLUCOSE: 171 mg/dL — AB (ref 65–99)
Potassium: 4 mmol/L (ref 3.5–5.1)
Sodium: 138 mmol/L (ref 135–145)

## 2017-06-26 LAB — CBC
HEMATOCRIT: 35.1 % — AB (ref 36.0–46.0)
HEMOGLOBIN: 10.7 g/dL — AB (ref 12.0–15.0)
MCH: 30.9 pg (ref 26.0–34.0)
MCHC: 30.5 g/dL (ref 30.0–36.0)
MCV: 101.4 fL — AB (ref 78.0–100.0)
Platelets: 354 10*3/uL (ref 150–400)
RBC: 3.46 MIL/uL — ABNORMAL LOW (ref 3.87–5.11)
RDW: 14.9 % (ref 11.5–15.5)
WBC: 7.4 10*3/uL (ref 4.0–10.5)

## 2017-06-26 LAB — GLUCOSE, CAPILLARY
GLUCOSE-CAPILLARY: 165 mg/dL — AB (ref 65–99)
Glucose-Capillary: 131 mg/dL — ABNORMAL HIGH (ref 65–99)
Glucose-Capillary: 185 mg/dL — ABNORMAL HIGH (ref 65–99)
Glucose-Capillary: 230 mg/dL — ABNORMAL HIGH (ref 65–99)

## 2017-06-26 MED ORDER — IPRATROPIUM-ALBUTEROL 0.5-2.5 (3) MG/3ML IN SOLN
3.0000 mL | Freq: Two times a day (BID) | RESPIRATORY_TRACT | Status: DC
  Administered 2017-06-27 – 2017-06-29 (×5): 3 mL via RESPIRATORY_TRACT
  Filled 2017-06-26 (×6): qty 3

## 2017-06-26 MED ORDER — IPRATROPIUM-ALBUTEROL 0.5-2.5 (3) MG/3ML IN SOLN
3.0000 mL | Freq: Three times a day (TID) | RESPIRATORY_TRACT | Status: DC
  Administered 2017-06-26 (×3): 3 mL via RESPIRATORY_TRACT
  Filled 2017-06-26 (×3): qty 3

## 2017-06-26 MED ORDER — IPRATROPIUM BROMIDE 0.02 % IN SOLN: 0.5000 mg | Freq: Three times a day (TID) | RESPIRATORY_TRACT | Status: DC

## 2017-06-26 MED ORDER — ALBUTEROL SULFATE (2.5 MG/3ML) 0.083% IN NEBU: 2.5000 mg | INHALATION_SOLUTION | Freq: Three times a day (TID) | RESPIRATORY_TRACT | Status: DC

## 2017-06-26 NOTE — Progress Notes (Signed)
Occupational Therapy Session Note  Patient Details  Name: Amanda Short MRN: [id number] Date of Birth: Jun 17, 1941  Today's Date: 06/26/2017 OT Individual Time: 1300-1356 OT Individual Time Calculation (min): 56 min    Short Term Goals: Week 1:  OT Short Term Goal 1 (Week 1): Pt will complete LB dressing with Mod A OT Short Term Goal 2 (Week 1): Pt will complete sit<stand during bathing with Min A OT Short Term Goal 3 (Week 1): Pt will complete BSC/toilet transfer with Min A and 02 sats >90%  Skilled Therapeutic Interventions/Progress Updates:    Upon entering the room, pt seated in wheelchair with husband initially present. Pt reports fatigue but agreeable to OT intervention. OT assisted pt to day room via wheelchair for energy conservation. Pt discussing leisure interests and states, " Enjoy doing crafts and art with my granddaughter". Session focused on standing from wheelchair for table top leisure task. Pt able to stand for 10 minutes with UE supported at table while coloring postcard for daughter with close supervision for balance. Pt taking rest break secondary to fatigue.O2 saturation at 99% after standing and HR of  68 bpm.  OT assisted pt back to bed room and pt transferred from wheelchair >bed with min A with RW. Steady assistance for sit >supine as well. OT educating pt and caregiver on focus this week and functional deficits that need to be addressed. Caregiver and pt in agreement. Call bell and all needs within reach upon exiting the room.   Therapy Documentation Precautions:  Precautions Precautions: Fall Precaution Comments: trach + PMSV, monitor 02 Restrictions Weight Bearing Restrictions: No General:   Vital Signs: Therapy Vitals Temp: 98.6 F (37 C) Temp Source: Oral Pulse Rate: 67 Resp: 18 BP: 138/61 Patient Position (if appropriate): Lying Oxygen Therapy SpO2: 94 % O2 Device: Tracheostomy Collar Pain:   ADL: ADL ADL Comments: Please see functional  navigator for ADL status Vision   Perception    Praxis   Exercises:   Other Treatments:    See Function Navigator for Current Functional Status.   Therapy/Group: Individual Therapy  Gypsy Decant 06/26/2017, 4:19 PM

## 2017-06-26 NOTE — Progress Notes (Signed)
Anxiety increases around shift change. PRN ativan given at 29.  PRN robitussin. Given at 70. Continent of B&B during night. 1 attempt to get OOB without assistance. PRN trazodone given at HS and effective. O2 sats occasionally drops in 80's, rebounds quickly. Coughing up thick cream colored sputum. Unstageable area to trach stoma. Amanda Short A

## 2017-06-26 NOTE — Plan of Care (Signed)
  RH SKIN INTEGRITY RH STG SKIN FREE OF INFECTION/BREAKDOWN Unstageable at trach stoma (present on admission) 06/26/2017 0215 - Not Progressing by Cornell Barman, RN

## 2017-06-26 NOTE — Progress Notes (Signed)
Speech Language Pathology Daily Session Note  Patient Details  Name: Amanda Short MRN: [id number] Date of Birth: September 16, 1940  Today's Date: 06/26/2017 SLP Individual Time: 1505-1600 SLP Individual Time Calculation (min): 55 min  Short Term Goals: Week 1: SLP Short Term Goal 1 (Week 1): Patient will donn and doff PMSV with Min assist verbal and visual cues  SLP Short Term Goal 2 (Week 1): Patient will utilize external aids to assist with recall of new information with Min assist question cues  SLP Short Term Goal 3 (Week 1): Patient will complete complex problem solving tasks with Min assist verbal, question cues to self-monitor and correct errors  Skilled Therapeutic Interventions:  Pt was seen for skilled ST targeting cognitive goals.  Pt was wearing speaking valve upon therapist's arrival and recalled parameters for safe valve use with min question cues.   Pt needed min-mod verbal and tactile cues to safely donn and doff valve.  Therapist also facilitated the session with medication management tasks to address goals for memory and problem solving.  Pt was able to recall function of medications when named for ~75% accuracy with supervision question cues; however, she needed mod assist verbal cues for task organization when loading pills into a pill box.  Task was not completed during time constraint.  Pt was left in bed with husband at bedside.  Continue per current plan of care.      Function:  Eating Eating                 Cognition Comprehension Comprehension assist level: Understands basic 90% of the time/cues < 10% of the time  Expression Expression assistive device: Talk trach valve Expression assist level: Expresses basic 90% of the time/requires cueing < 10% of the time.  Social Interaction Social Interaction assist level: Interacts appropriately 75 - 89% of the time - Needs redirection for appropriate language or to initiate interaction.  Problem Solving Problem solving  assist level: Solves basic 75 - 89% of the time/requires cueing 10 - 24% of the time  Memory Memory assist level: Recognizes or recalls 50 - 74% of the time/requires cueing 25 - 49% of the time    Pain Pain Assessment Pain Assessment: No/denies pain   Therapy/Group: Individual Therapy  Eleftherios Dudenhoeffer, Selinda Orion 06/26/2017, 9:28 PM

## 2017-06-26 NOTE — Progress Notes (Signed)
Patient information reviewed and entered into eRehab system by Daiva Nakayama, RN, CRRN, [place] Coordinator.  Information including medical coding and functional independence measure will be reviewed and updated through discharge.     Per nursing patient was given "Data Collection Information Summary for Patients in Inpatient Rehabilitation Facilities with attached "Privacy Act [place] Records" upon admission.  Present in education notebook.

## 2017-06-26 NOTE — Progress Notes (Addendum)
Occupational Therapy Note  Patient Details  Name: Amanda Short MRN: [id number] Date of Birth: 24-Apr-1941  Today's Date: 06/26/2017 OT Individual Time: 0900-0930 OT Individual Time Calculation (min): 30 min   Pt denies pain Individual Therapy  Pt resting in bed upon arrival with husband present.  Pt threaded pants at bed level and sat EOB with min A for sit<>stand to pull up pants.  O2 sats at 75% during activity but quickly recovers.  Pt performed sit<>stand and stand pivot transfer with min A and min verbal cues for safety awareness.  O2 sats at 67% during transfer but quickly recovered.  Pt performed sit<>stand X 5 with O2 sats >88%.  Pt remained in w/c with all needs within reach and husband present.   Leotis Shames [facility] 06/26/2017, 9:37 AM

## 2017-06-26 NOTE — Consult Note (Signed)
Neuropsychological Consultation   Patient:   Amanda Short   DOB:   [birth year]  MR Number:  [id number]  Location:  [facility] [id number] [place] [address] Dept: [phone] GEX: [phone]           Date of Service:   06/26/2017  Start Time:   2 PM End Time:   3 PM  Provider/Observer:  Ilean Skill, Psy.D.       Clinical Neuropsychologist       Billing Code/Service: [phone] 4 Units  Chief Complaint:    Amanda Short is a 76 year old female who has a history of hypertension, type 2 diabetes and recent bout with diverticulitis requiring hospitalization between 11/11 and 05/30/2017.  The patient presented to AMS on 06/06/2017 with left-sided weakness and left gaze deviation.  CT was negative for acute issues such as stroke.  However, the patient had a seizure while in x-ray with significant difficulty protecting airway and was intubated.  Multiple doses of IV Ativan used to bring seizure under control and was loaded with Keppra.  Low Ca/MG level supplemented.  EEG showed evidence of severe diffuse encephalopathy.  Further CT suggest moderate cerebral atrophy.  The patient continued to have some cognitive deficits with decreased balance and indications of hypoxia affecting activity.  Comprehensive inpatient rehabilitation was recommended due to functional deficits.  Reason for Service:  The patient was referred for neuropsychological/psychological consultation due to issues related to adapting and adjusting to current status and the development of anxiety/worry.  The patient is having some coping/stress response to being in the hospital for the second time in a month.  Below is the HPI for the current admission.  Amanda Short a 76 y.o.femalewith history of HTN, T2DM, h/o PE, recent bout with diverticulitis 11/11to 05/30/17; who was admitted on 06/06/17 with AMS with  weakness left side and left gaze deviation. Stat CT negativereviewed, unremarkable for acute process. Seizure activity in Xray with difficulty protecting airway and was intubated. She was loaded with keppra and required multiple doses IV ativan due to status epilepticus. Low Ca/Mg levels supplemented and LTM EEG showed evidence of severe diffuse encephalopathy. Hospital course significant for agitation requiring precedex as well as restraints, inability to tolerate extubation requiring emergent tracheostomy at bedside due to stridor/marked swelling of VC on 06/12/17. She was stated on IV antibiotics for aspiration PNA andhas beenweaned to ATC.   Swallow evaluation done and she was started on regular diet with recommendations to downgrade to thickened liquids in case of respiratory decline.She has been tolerating regular diet but continues to have desaturations at nights. Trach downsizing was held due to higher oxygen needs of 60% Fio2. 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 She continues to have cognitive deficits with decreased balance and hypoxia affecting activity. CIR recommended due to functional deficits.   Current Status:  The patient was seen today for extended clinical interview and working on issues related to anxiety state and coping.  The patient reports that she feels like her mental status is returning back to baseline.  While there were some issues with regard to attention and concentration she did have good mental status and was oriented x4.  The patient reports that generally her mood is positive with the exception of times where she gets a lot of anxiety.  She reports most often this is  during nighttime waking where she will have fears that she is going to suffocate her choke that she feels are directly related to her trach.  The patient denies any memory or recall of her attempted  intubation and trach after seizure that led to her current hospitalization.  The patient denies any nightmares or flashbacks from those events but does describe anxiety and fear of suffocating which may be directly or indirectly related to these events.  Behavioral Observation: Amanda Short  presents as a 76 y.o.-year-old Right Caucasian Female who appeared her stated age. her dress was Appropriate and she was Well Groomed and her manners were Appropriate to the situation.  her participation was indicative of Appropriate and Inattentive behaviors.  There were physical disabilities noted related to weakness.  she displayed an appropriate level of cooperation and motivation.     Interactions:    Active Appropriate and Inattentive  Attention:   abnormal and attention span appeared shorter than expected for age  Memory:   The patient reports that she has had some memory issues but not severe.  She reports that she does have some significant retrograde amnesia from the time of her seizure event.  However, she reports that now that she is breathing better and being more active that her memory and cognition are returning to baseline.;   Visuo-spatial:  within normal limits  Speech (Volume):  normal  Speech:   normal; normal  Thought Process:  Coherent and Relevant  Though Content:  WNL; not suicidal  Orientation:   person, place, time/date and situation  Judgment:   Fair  Planning:   Fair  Affect:    Anxious  Mood:    Anxious  Insight:   Good  Intelligence:   normal  Marital Status/Living: The patient is married and has a supportive husband who was there at the time of the current clinical interview.  He was facilitating her feeding and was quite attentive to her.   Medical History:   Past Medical History:  Diagnosis Date  . Arthritis    "back, hands" (07/22/2015)  . Cancer of left breast ([facility]) 2006   S/P lumpectomy  . Complication of anesthesia    "brief breathing problem  at surgery center in ~ 2005 when I had gallbladder OR"  . Depression   . DVT (deep venous thrombosis) ([facility]) 03/2013   LLE  . GERD (gastroesophageal reflux disease)   . Hyperlipidemia   . Hypertension   . Hypothyroidism   . OSA treated with BiPAP   . Pulmonary embolism ([facility]) 03/2013  . Thyroid disease    Hypothyroid  . Type II diabetes mellitus ([facility])         Psychiatric History:  The patient does acknowledge a prior history of depression but denies current depressive symptoms.  She reports that most of her symptoms right now appear to be related to anxiety associated with her trach and prior issues with breathing.  Family Med/Psych History:  Family History  Problem Relation Age of Onset  . Diabetes Unknown   . Diabetes Mellitus II Sister     Risk of Suicide/Violence: virtually non-existent   Impression/DX:  Amanda Short is a 76 year old female who has a history of hypertension, type 2 diabetes and recent bout with diverticulitis requiring hospitalization between 11/11 and 05/30/2017.  The patient presented to AMS on 06/06/2017 with left-sided weakness and left gaze deviation.  CT was negative for acute issues such as stroke.  However, the patient had a seizure while in  x-ray with significant difficulty protecting airway and was intubated.  Multiple doses of IV Ativan used to bring seizure under control and was loaded with Keppra.  Low Ca/MG level supplemented.  EEG showed evidence of severe diffuse encephalopathy.  Further CT suggest moderate cerebral atrophy.  The patient continued to have some cognitive deficits with decreased balance and indications of hypoxia affecting activity.  Comprehensive inpatient rehabilitation was recommended due to functional deficits.  The patient was seen today for extended clinical interview and working on issues related to anxiety state and coping.  The patient reports that she feels like her mental status is returning back to baseline.  While there were  some issues with regard to attention and concentration she did have good mental status and was oriented x4.  The patient reports that generally her mood is positive with the exception of times where she gets a lot of anxiety.  She reports most often this is during nighttime waking where she will have fears that she is going to suffocate her choke that she feels are directly related to her trach.  The patient denies any memory or recall of her attempted intubation and trach after seizure that led to her current hospitalization.  The patient denies any nightmares or flashbacks from those events but does describe anxiety and fear of suffocating which may be directly or indirectly related to these events.  Disposition/Plan:  Will see the patient again if anxiety continues to continue to work on coping and adaptive responses to current hospitalization.  Diagnosis:    Debility, anxiety state        Electronically Signed   _______________________ Ilean Skill, Psy.D.

## 2017-06-26 NOTE — Progress Notes (Signed)
Patient due trach change, however, husband spoke of downsizing tomorrow. Contacted MD to see if that was the plan. Will address and change tomorrow morning.

## 2017-06-26 NOTE — Progress Notes (Signed)
Subjective/Complaints: Patient states that she slept well.  Feels tired this morning.  Nurse reports anxiety at shift change.  ROS: pt denies nausea, vomiting, diarrhea, cough, shortness of breath or chest pain   Objective: Vital Signs: Blood pressure (!) 158/80, pulse 68, temperature 97.8 F (36.6 C), temperature source Oral, resp. rate 18, weight 77.2 kg (170 lb 3.1 oz), SpO2 92 %. No results found. Results for orders placed or performed during the hospital encounter of 06/23/17 (from the past 72 hour(s))  Glucose, capillary     Status: Abnormal   Collection Time: 06/23/17  5:16 PM  Result Value Ref Range   Glucose-Capillary 127 (H) 65 - 99 mg/dL  Glucose, capillary     Status: Abnormal   Collection Time: 06/23/17  8:03 PM  Result Value Ref Range   Glucose-Capillary 191 (H) 65 - 99 mg/dL  CBC WITH DIFFERENTIAL     Status: Abnormal   Collection Time: 06/24/17  5:07 AM  Result Value Ref Range   WBC 8.7 4.0 - 10.5 K/uL   RBC 3.57 (L) 3.87 - 5.11 MIL/uL   Hemoglobin 10.9 (L) 12.0 - 15.0 g/dL   HCT 35.5 (L) 36.0 - 46.0 %   MCV 99.4 78.0 - 100.0 fL   MCH 30.5 26.0 - 34.0 pg   MCHC 30.7 30.0 - 36.0 g/dL   RDW 14.4 11.5 - 15.5 %   Platelets 356 150 - 400 K/uL   Neutrophils Relative % 64 %   Neutro Abs 5.6 1.7 - 7.7 K/uL   Lymphocytes Relative 17 %   Lymphs Abs 1.5 0.7 - 4.0 K/uL   Monocytes Relative 12 %   Monocytes Absolute 1.0 0.1 - 1.0 K/uL   Eosinophils Relative 6 %   Eosinophils Absolute 0.5 0.0 - 0.7 K/uL   Basophils Relative 1 %   Basophils Absolute 0.1 0.0 - 0.1 K/uL  Comprehensive metabolic panel     Status: Abnormal   Collection Time: 06/24/17  5:07 AM  Result Value Ref Range   Sodium 140 135 - 145 mmol/L   Potassium 4.2 3.5 - 5.1 mmol/L   Chloride 96 (L) 101 - 111 mmol/L   CO2 36 (H) 22 - 32 mmol/L   Glucose, Bld 174 (H) 65 - 99 mg/dL   BUN 5 (L) 6 - 20 mg/dL   Creatinine, Ser 0.86 0.44 - 1.00 mg/dL   Calcium 9.2 8.9 - 10.3 mg/dL   Total Protein 5.4 (L) 6.5  - 8.1 g/dL   Albumin 2.8 (L) 3.5 - 5.0 g/dL   AST 10 (L) 15 - 41 U/L   ALT 14 14 - 54 U/L   Alkaline Phosphatase 72 38 - 126 U/L   Total Bilirubin 0.7 0.3 - 1.2 mg/dL   GFR calc non Af Amer >60 >60 mL/min   GFR calc Af Amer >60 >60 mL/min    Comment: (NOTE) The eGFR has been calculated using the CKD EPI equation. This calculation has not been validated in all clinical situations. eGFR's persistently <60 mL/min signify possible Chronic Kidney Disease.    Anion gap 8 5 - 15  Glucose, capillary     Status: Abnormal   Collection Time: 06/24/17  6:20 AM  Result Value Ref Range   Glucose-Capillary 135 (H) 65 - 99 mg/dL  Glucose, capillary     Status: Abnormal   Collection Time: 06/24/17 11:56 AM  Result Value Ref Range   Glucose-Capillary 156 (H) 65 - 99 mg/dL  Glucose, capillary     Status:  Abnormal   Collection Time: 06/24/17  4:22 PM  Result Value Ref Range   Glucose-Capillary 165 (H) 65 - 99 mg/dL  Glucose, capillary     Status: Abnormal   Collection Time: 06/24/17  8:57 PM  Result Value Ref Range   Glucose-Capillary 222 (H) 65 - 99 mg/dL  Glucose, capillary     Status: Abnormal   Collection Time: 06/25/17  6:11 AM  Result Value Ref Range   Glucose-Capillary 162 (H) 65 - 99 mg/dL  Glucose, capillary     Status: Abnormal   Collection Time: 06/25/17 11:47 AM  Result Value Ref Range   Glucose-Capillary 165 (H) 65 - 99 mg/dL  Glucose, capillary     Status: Abnormal   Collection Time: 06/25/17  4:30 PM  Result Value Ref Range   Glucose-Capillary 160 (H) 65 - 99 mg/dL  Glucose, capillary     Status: Abnormal   Collection Time: 06/25/17  9:40 PM  Result Value Ref Range   Glucose-Capillary 159 (H) 65 - 99 mg/dL   Comment 1 Notify RN   Basic metabolic panel     Status: Abnormal   Collection Time: 06/26/17  4:29 AM  Result Value Ref Range   Sodium 138 135 - 145 mmol/L   Potassium 4.0 3.5 - 5.1 mmol/L   Chloride 96 (L) 101 - 111 mmol/L   CO2 34 (H) 22 - 32 mmol/L   Glucose,  Bld 171 (H) 65 - 99 mg/dL   BUN 9 6 - 20 mg/dL   Creatinine, Ser 0.85 0.44 - 1.00 mg/dL   Calcium 9.1 8.9 - 10.3 mg/dL   GFR calc non Af Amer >60 >60 mL/min   GFR calc Af Amer >60 >60 mL/min    Comment: (NOTE) The eGFR has been calculated using the CKD EPI equation. This calculation has not been validated in all clinical situations. eGFR's persistently <60 mL/min signify possible Chronic Kidney Disease.    Anion gap 8 5 - 15  CBC     Status: Abnormal   Collection Time: 06/26/17  4:29 AM  Result Value Ref Range   WBC 7.4 4.0 - 10.5 K/uL   RBC 3.46 (L) 3.87 - 5.11 MIL/uL   Hemoglobin 10.7 (L) 12.0 - 15.0 g/dL   HCT 35.1 (L) 36.0 - 46.0 %   MCV 101.4 (H) 78.0 - 100.0 fL   MCH 30.9 26.0 - 34.0 pg   MCHC 30.5 30.0 - 36.0 g/dL   RDW 14.9 11.5 - 15.5 %   Platelets 354 150 - 400 K/uL  Glucose, capillary     Status: Abnormal   Collection Time: 06/26/17  7:28 AM  Result Value Ref Range   Glucose-Capillary 165 (H) 65 - 99 mg/dL     HEENT: trach #6 cuffed Shiley, with Passy-Muir valve in place.  Cardio: RRR without murmur. No JVD   Resp: Ongoing expiratory wheezes.  Otherwise fair air movements GI: BS positive and NT, ND Extremity:   Trace lower extremity edema.  Skin warm, pulses intact Skin:   Intact and Other trach site as above Neuro: Alert/Oriented, Normal Sensory and Abnormal Motor 45 in BUE and BLE Musc/Skel:  Normal Gen Pleasant and cooperative   Assessment/Plan: 1. Functional deficits secondary to Debilty after VDRF which require 3+ hours per day of interdisciplinary therapy in a comprehensive inpatient rehab setting. Physiatrist is providing close team supervision and 24 hour management of active medical problems listed below. Physiatrist and rehab team continue to assess barriers to discharge/monitor patient progress  toward functional and medical goals. FIM: Function - Bathing Position: Wheelchair/chair at sink Body parts bathed by patient: Right arm, Left arm, Chest,  Abdomen, Front perineal area, Right upper leg, Left upper leg, Buttocks Body parts bathed by helper: Right lower leg, Left lower leg, Back Assist Level: Touching or steadying assistance(Pt > 75%)  Function- Upper Body Dressing/Undressing What is the patient wearing?: Pull over shirt/dress Bra - Perfomed by helper: Thread/unthread right bra strap, Thread/unthread left bra strap, Hook/unhook bra (pull down sports bra) Pull over shirt/dress - Perfomed by patient: Thread/unthread right sleeve, Thread/unthread left sleeve, Put head through opening, Pull shirt over trunk Function - Lower Body Dressing/Undressing What is the patient wearing?: Underwear, Pants Position: Wheelchair/chair at sink Underwear - Performed by helper: Thread/unthread right underwear leg, Thread/unthread left underwear leg, Pull underwear up/down Pants- Performed by helper: Thread/unthread right pants leg, Thread/unthread left pants leg, Pull pants up/down Non-skid slipper socks- Performed by helper: Don/doff right sock, Don/doff left sock Assist for footwear: Dependant Assist for lower body dressing: (MaxA)  Function - Toileting Toileting activity did not occur: No continent bowel/bladder event Toileting steps completed by patient: Performs perineal hygiene Toileting steps completed by helper: Adjust clothing prior to toileting, Adjust clothing after toileting Assist level: Touching or steadying assistance (Pt.75%)  Function - Toilet Transfers Toilet transfer assistive device: Bedside commode Assist level to bedside commode (at bedside): Moderate assist (Pt 50 - 74%/lift or lower) Assist level from bedside commode (at bedside): Moderate assist (Pt 50 - 74%/lift or lower)  Function - Chair/bed transfer Chair/bed transfer method: Stand pivot Chair/bed transfer assist level: Touching or steadying assistance (Pt > 75%) Chair/bed transfer assistive device: Armrests Chair/bed transfer details: Verbal cues for  precautions/safety  Function - Locomotion: Wheelchair Will patient use wheelchair at discharge?: No Type: Manual Max wheelchair distance: 30 Assist Level: Touching or steadying assistance (Pt > 75%) Assist Level: Touching or steadying assistance (Pt > 75%) Wheel 150 feet activity did not occur: Safety/medical concerns Turns around,maneuvers to table,bed, and toilet,negotiates 3% grade,maneuvers on rugs and over doorsills: No Function - Locomotion: Ambulation Assistive device: Walker-rolling Max distance: 65 Assist level: Touching or steadying assistance (Pt > 75%) Assist level: Touching or steadying assistance (Pt > 75%) Walk 50 feet with 2 turns activity did not occur: Safety/medical concerns Assist level: Touching or steadying assistance (Pt > 75%) Walk 150 feet activity did not occur: Safety/medical concerns Walk 10 feet on uneven surfaces activity did not occur: Safety/medical concerns  Function - Comprehension Comprehension: Auditory Comprehension assist level: Understands basic 75 - 89% of the time/ requires cueing 10 - 24% of the time  Function - Expression Expression: Verbal Expression assistive device: Talk trach valve Expression assist level: Expresses basic 75 - 89% of the time/requires cueing 10 - 24% of the time. Needs helper to occlude trach/needs to repeat words.  Function - Social Interaction Social Interaction assist level: Interacts appropriately 75 - 89% of the time - Needs redirection for appropriate language or to initiate interaction.  Function - Problem Solving Problem solving assist level: Solves basic 75 - 89% of the time/requires cueing 10 - 24% of the time  Function - Memory Memory assist level: Recognizes or recalls 50 - 74% of the time/requires cueing 25 - 49% of the time Patient normally able to recall (first 3 days only): Current season, Location of own room, That he or she is in a hospital, Staff names and faces  Medical Problem List and  Plan: 1. Debility secondary to VDRF   -  Continue inpatient rehab.  Team conference today 2. DVT Prophylaxis/Anticoagulation: Pharmaceutical: Xarelto 3. Pain Management: acetaminophen 4. Mood:  Continued ego support fRom team for anxiety    -Ativan prn.  Wean to Xanax eventually   -Anxiety impacting activity at times. 5. Neuropsych: This patient is capable of making decisions on her own behalf. 6. Skin/Wound Care: Prophyllactic foam dressing to sacrum 7. Fluids/Electrolytes/Nutrition: carb mod diet 8. 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 Change to po, no seizures since rehab admit 9. VDRF: Continue ATC with pulmonary hygiene.     - downsize to #4 eventually.  Continue Passy-Muir valve during the day    -Persistent expiratory wheezes.  We will schedule albuterol along with Atrovent  nebulizer 10. 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      LOS (Days) 3 A FACE TO FACE EVALUATION WAS PERFORMED  Keyundra Fant T 06/26/2017, 9:21 AM

## 2017-06-26 NOTE — Patient Care Conference (Signed)
Inpatient RehabilitationTeam Conference and Plan of Care Update Date: 06/26/2017   Time: 2:30 PM    Patient Name: Amanda Short      Medical Record Number: [id number]  Date of Birth: 1940/07/20 Sex: Female         Room/Bed: 4W20C/4W20C-01 Payor Info: Payor: [employer] MEDICARE / Plan: [employer] MEDICARE / Product Type: *No Product type* /    Admitting Diagnosis: Debility  Admit Date/Time:  06/23/2017  3:19 PM Admission Comments: No comment available   Primary Diagnosis:  Physical debility Principal Problem: Physical debility  Patient Active Problem List   Diagnosis Date Noted  . Anxiety state   . Physical debility 06/23/2017  . 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 History of acute respiratory failure   . Tracheostomy, acute management ([place])   . Aspiration pneumonia ([place])   . Encephalopathy   . Status epilepticus ([place])   . Benign essential HTN   . 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 History of pulmonary embolism   . Acute blood loss anemia   . Diverticulitis of colon   . Tachypnea   . Encounter for orogastric (OG) tube placement   . Encounter for central line placement   . Encounter for attention to tracheostomy ([employer])   . Acute respiratory failure with hypoxia ([place])   . Acute encephalopathy 06/06/2017  . Diverticulitis of large intestine without perforation or abscess without bleeding   . Abdominal pain   . Non-intractable cyclical vomiting with nausea   . Abnormal CT of the abdomen   . Hypocalcemia 05/27/2017  . 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 Colitis 05/27/2017  . Diabetic foot infection ([place]) 07/21/2015  . Diabetes mellitus type 2, controlled ([place]) 07/21/2015  . Cellulitis of foot, right 07/21/2015  . Cellulitis of right foot 07/21/2015  . Acute pulmonary embolism ([place]) 04/01/2013  . DVT (deep venous thrombosis) ([place]) 04/01/2013  . HX: breast cancer 04/01/2013  . Concussion 04/01/2013  . Diabetes ([place]) 04/01/2013  . Essential hypertension,  benign 04/01/2013  . Unspecified hypothyroidism 04/01/2013  . OSA on CPAP 04/01/2013    Expected Discharge Date: Expected Discharge Date: 07/05/17  Team Members Present: Physician leading conference: Dr. Alger Simons Social Worker Present: Lennart Pall, LCSW Nurse Present: Junius Creamer, RN PT Present: Other (comment)(Cindy Rick Duff, PT) OT Present: Willeen Cass, Rhetta Mura, OT SLP Present: Weston Anna, SLP PPS Coordinator present : Daiva Nakayama, RN, CRRN     Current Status/Progress Goal Weekly Team Focus  Medical   Admitted with deconditioning and encephalopathy after respiratory failure.  #6 trach still in place but tolerating Passy-Muir valve  Improve exercise capacity  Downsize trach, improve airway movement   Bowel/Bladder   Occasional urinary incontinence during night/ Continent of BM  Continent of B&B  time toilet at HS and during night   Swallow/Nutrition/ Hydration             ADL's   Mod A bathing EOB, Mod A UB dressing, Max-Total A LB dressing, Mod A functional BSC transfer + Max A toileting tasks   Supervision/cuing   Functional transfers, balance, activity tolerance, ADL retraining, pt/familiy education   Mobility   min A transfers and gait 40' RW, stairs min A with rails  S overall  endurance, gait, standing, stairs, balance, caregiver ed   Communication   #6 trach, PMSV with full supervision   Mod I  donning/doffing PMSV   Safety/Cognition/ Behavioral Observations  Mod A  Supervision  complex problem solving, recall  Pain   rarely complains of pain  Pain level < 2  Min to zero complaints of pain.   Skin   unstageable to trach stoma, sacrum red  No further skin breakdown.  Decreased redness to sacrum, patient to wear drain sponge to trach site (currently refusing)    Rehab Goals Patient on target to meet rehab goals: Yes *See Care Plan and progress notes for long and short-term goals.     Barriers to Discharge  Current Status/Progress Possible  Resolutions Date Resolved   Physician    Medical stability        Ongoing trach management and downsizing.  Potentially decannulate prior to discharge      Nursing  Trach;Medical stability;Incontinence               PT                    OT Trach;New oxygen                SLP Trach spouse would like for patient to be decannulated prior to discharge            SW                Discharge Planning/Teaching Needs:  Pt to d/c home with spouse who can provide 24/7 assistance.  TEaching to be planned closer to d/c.   Team Discussion:  Still with #6 trach but hope to downsize this week and decannulate prior to d/c.  Mod assist with cognition.  On reg diet.  Better with timed toileting to maintain continence.  Currently min assist with mobility and mod-max with self care.  Goals being set for supervision.    Revisions to Treatment Plan:  None    Continued Need for Acute Rehabilitation Level of Care: The patient requires daily medical management by a physician with specialized training in physical medicine and rehabilitation for the following conditions: Daily direction of a multidisciplinary physical rehabilitation program to ensure safe treatment while eliciting the highest outcome that is of practical value to the patient.: Yes Daily medical management of patient stability for increased activity during participation in an intensive rehabilitation regime.: Yes Daily analysis of laboratory values and/or radiology reports with any subsequent need for medication adjustment of medical intervention for : Pulmonary problems;Neurological problems  Suesan Mohrmann 06/26/2017, 3:13 PM

## 2017-06-26 NOTE — Progress Notes (Signed)
Physical Therapy Session Note  Patient Details  Name: Amanda Short MRN: [id number] Date of Birth: 1941-05-28  Today's Date: 06/26/2017 PT Individual Time: 1100-1200 PT Individual Time Calculation (min): 60 min   Short Term Goals: Week 1:  PT Short Term Goal 1 (Week 1): Pt will ambulate 100 ft with RW and min assist PT Short Term Goal 2 (Week 1): Pt will perform bed mobility with supervision  PT Short Term Goal 3 (Week 1): Pt will tolerate 10 minutes of activity OOB without rest break  Skilled Therapeutic Interventions/Progress Updates:   Pt in w/c upon arrival and agreeable to therapy, no c/o pain. Pt on 10L O2/min throughout session, O2 sats remained >95%, w/ exception of brief desat to 85% when switching to new O2 tank. Total assist w/c transport to/from gym for time management. Worked on functional mobility and endurance this session. Performed 5x sit<>stand w/ min guard to close supervision x2 bouts, static standing balance 2 min x2 while performing bimanual UE task, and UE reaching task w/ unilateral UE support on RW 2 min x3. 1 LOB requiring min assist to correct in standing, otherwise min guard for safety throughout standing activities. Ambulated 10' and 25' using RW w/ min guard. Frequent seated rest breaks throughout session 2/2 increased work of breathing and fatigue. Minimal verbal cues for breathin techniques during rest breaks and encouraged pt to continue w/ UE activity in seated when needing a break from standing. Switched w/c to lower height so pt's feet could better reach floor. Returned to room and ended session sitting in w/c and in care of husband, all needs met.   Therapy Documentation Precautions:  Precautions Precautions: Fall Precaution Comments: trach + PMSV, monitor 02 Restrictions Weight Bearing Restrictions: No Vital Signs: Therapy Vitals Pulse Rate: (!) 58 Resp: 18 Patient Position (if appropriate): Sitting Oxygen Therapy SpO2: 98 % O2 Device:  Tracheostomy Collar O2 Flow Rate (L/min): 5 L/min FiO2 (%): 28 % Pain: Pain Assessment Pain Assessment: No/denies pain  See Function Navigator for Current Functional Status.   Therapy/Group: Individual Therapy  Yerania Chamorro K Arnette 06/26/2017, 12:42 PM

## 2017-06-27 ENCOUNTER — Inpatient Hospital Stay (HOSPITAL_COMMUNITY): Payer: Medicare Other

## 2017-06-27 ENCOUNTER — Inpatient Hospital Stay (HOSPITAL_COMMUNITY): Payer: Medicare Other | Admitting: Speech Pathology

## 2017-06-27 ENCOUNTER — Inpatient Hospital Stay (HOSPITAL_COMMUNITY): Payer: Medicare Other | Admitting: Occupational Therapy

## 2017-06-27 DIAGNOSIS — F411 Generalized anxiety disorder: Secondary | ICD-10-CM

## 2017-06-27 LAB — GLUCOSE, CAPILLARY
GLUCOSE-CAPILLARY: 192 mg/dL — AB (ref 65–99)
Glucose-Capillary: 199 mg/dL — ABNORMAL HIGH (ref 65–99)
Glucose-Capillary: 128 mg/dL — ABNORMAL HIGH (ref 65–99)
Glucose-Capillary: 192 mg/dL — ABNORMAL HIGH (ref 65–99)

## 2017-06-27 MED ORDER — LORAZEPAM 0.5 MG PO TABS
0.5000 mg | ORAL_TABLET | Freq: Four times a day (QID) | ORAL | Status: DC | PRN
  Administered 2017-06-28: 0.5 mg via ORAL
  Filled 2017-06-27 (×2): qty 1

## 2017-06-27 MED ORDER — ALPRAZOLAM 0.5 MG PO TABS
0.5000 mg | ORAL_TABLET | Freq: Every day | ORAL | Status: DC
  Administered 2017-06-27: 0.5 mg via ORAL
  Filled 2017-06-27: qty 1

## 2017-06-27 NOTE — Progress Notes (Signed)
Subjective/Complaints: Again struggled with anxiety last night.  Breathing seems to come and go.  Still feels that she is wheezing and struggling to breathe at times  ROS: pt denies nausea, vomiting, diarrhea, cough, shortness of breath or chest pain   Objective: Vital Signs: Blood pressure 140/62, pulse 63, temperature 98.9 F (37.2 C), temperature source Oral, resp. rate 18, weight 77.2 kg (170 lb 1.5 oz), SpO2 98 %. No results found. Results for orders placed or performed during the hospital encounter of 06/23/17 (from the past 72 hour(s))  Glucose, capillary     Status: Abnormal   Collection Time: 06/24/17 11:56 AM  Result Value Ref Range   Glucose-Capillary 156 (H) 65 - 99 mg/dL  Glucose, capillary     Status: Abnormal   Collection Time: 06/24/17  4:22 PM  Result Value Ref Range   Glucose-Capillary 165 (H) 65 - 99 mg/dL  Glucose, capillary     Status: Abnormal   Collection Time: 06/24/17  8:57 PM  Result Value Ref Range   Glucose-Capillary 222 (H) 65 - 99 mg/dL  Glucose, capillary     Status: Abnormal   Collection Time: 06/25/17  6:11 AM  Result Value Ref Range   Glucose-Capillary 162 (H) 65 - 99 mg/dL  Glucose, capillary     Status: Abnormal   Collection Time: 06/25/17 11:47 AM  Result Value Ref Range   Glucose-Capillary 165 (H) 65 - 99 mg/dL  Glucose, capillary     Status: Abnormal   Collection Time: 06/25/17  4:30 PM  Result Value Ref Range   Glucose-Capillary 160 (H) 65 - 99 mg/dL  Glucose, capillary     Status: Abnormal   Collection Time: 06/25/17  9:40 PM  Result Value Ref Range   Glucose-Capillary 159 (H) 65 - 99 mg/dL   Comment 1 Notify RN   Basic metabolic panel     Status: Abnormal   Collection Time: 06/26/17  4:29 AM  Result Value Ref Range   Sodium 138 135 - 145 mmol/L   Potassium 4.0 3.5 - 5.1 mmol/L   Chloride 96 (L) 101 - 111 mmol/L   CO2 34 (H) 22 - 32 mmol/L   Glucose, Bld 171 (H) 65 - 99 mg/dL   BUN 9 6 - 20 mg/dL   Creatinine, Ser 0.85 0.44 -  1.00 mg/dL   Calcium 9.1 8.9 - 10.3 mg/dL   GFR calc non Af Amer >60 >60 mL/min   GFR calc Af Amer >60 >60 mL/min    Comment: (NOTE) The eGFR has been calculated using the CKD EPI equation. This calculation has not been validated in all clinical situations. eGFR's persistently <60 mL/min signify possible Chronic Kidney Disease.    Anion gap 8 5 - 15  CBC     Status: Abnormal   Collection Time: 06/26/17  4:29 AM  Result Value Ref Range   WBC 7.4 4.0 - 10.5 K/uL   RBC 3.46 (L) 3.87 - 5.11 MIL/uL   Hemoglobin 10.7 (L) 12.0 - 15.0 g/dL   HCT 35.1 (L) 36.0 - 46.0 %   MCV 101.4 (H) 78.0 - 100.0 fL   MCH 30.9 26.0 - 34.0 pg   MCHC 30.5 30.0 - 36.0 g/dL   RDW 14.9 11.5 - 15.5 %   Platelets 354 150 - 400 K/uL  Glucose, capillary     Status: Abnormal   Collection Time: 06/26/17  7:28 AM  Result Value Ref Range   Glucose-Capillary 165 (H) 65 - 99 mg/dL  Glucose, capillary  Status: Abnormal   Collection Time: 06/26/17 12:07 PM  Result Value Ref Range   Glucose-Capillary 131 (H) 65 - 99 mg/dL  Glucose, capillary     Status: Abnormal   Collection Time: 06/26/17  4:15 PM  Result Value Ref Range   Glucose-Capillary 185 (H) 65 - 99 mg/dL  Glucose, capillary     Status: Abnormal   Collection Time: 06/26/17  9:00 PM  Result Value Ref Range   Glucose-Capillary 230 (H) 65 - 99 mg/dL  Glucose, capillary     Status: Abnormal   Collection Time: 06/27/17  6:44 AM  Result Value Ref Range   Glucose-Capillary 199 (H) 65 - 99 mg/dL     HEENT: trach #6 cuffed Shiley, with Passy-Muir valve in place.  Cardio: RRR without murmur. No JVD   Resp: Has inspiratory and expiratory breath sounds today including wheezes and some scattered rhonchi.  Does not appear to be working hard to breathe however GI: BS positive and NT, ND Extremity:   Trace lower extremity edema.  Skin warm, pulses intact Skin:   Intact and Other trach site as above Neuro: Alert/Oriented, Normal Sensory and Abnormal Motor 4/5 in  BUE and BLE Musc/Skel:  Normal Psych: May be slightly anxious but not overly  so.  She is pleasant and cooperative   Assessment/Plan: 1. Functional deficits secondary to Debilty after VDRF which require 3+ hours per day of interdisciplinary therapy in a comprehensive inpatient rehab setting. Physiatrist is providing close team supervision and 24 hour management of active medical problems listed below. Physiatrist and rehab team continue to assess barriers to discharge/monitor patient progress toward functional and medical goals. FIM: Function - Bathing Position: Wheelchair/chair at sink Body parts bathed by patient: Right arm, Left arm, Chest, Abdomen, Front perineal area, Right upper leg, Left upper leg, Buttocks Body parts bathed by helper: Right lower leg, Left lower leg, Back Assist Level: Touching or steadying assistance(Pt > 75%)  Function- Upper Body Dressing/Undressing What is the patient wearing?: Pull over shirt/dress Bra - Perfomed by helper: Thread/unthread right bra strap, Thread/unthread left bra strap, Hook/unhook bra (pull down sports bra) Pull over shirt/dress - Perfomed by patient: Thread/unthread right sleeve, Thread/unthread left sleeve, Put head through opening, Pull shirt over trunk Function - Lower Body Dressing/Undressing What is the patient wearing?: Underwear, Pants Position: Wheelchair/chair at sink Underwear - Performed by helper: Thread/unthread right underwear leg, Thread/unthread left underwear leg, Pull underwear up/down Pants- Performed by helper: Thread/unthread right pants leg, Thread/unthread left pants leg, Pull pants up/down Non-skid slipper socks- Performed by helper: Don/doff right sock, Don/doff left sock Assist for footwear: Dependant Assist for lower body dressing: (MaxA)  Function - Toileting Toileting activity did not occur: No continent bowel/bladder event Toileting steps completed by patient: Performs perineal hygiene Toileting steps  completed by helper: Adjust clothing prior to toileting, Performs perineal hygiene, Adjust clothing after toileting Toileting Assistive Devices: Grab bar or rail Assist level: Touching or steadying assistance (Pt.75%)  Function - Air cabin crew transfer assistive device: Bedside commode Assist level to bedside commode (at bedside): Moderate assist (Pt 50 - 74%/lift or lower) Assist level from bedside commode (at bedside): Moderate assist (Pt 50 - 74%/lift or lower)  Function - Chair/bed transfer Chair/bed transfer method: Stand pivot Chair/bed transfer assist level: Touching or steadying assistance (Pt > 75%) Chair/bed transfer assistive device: Armrests Chair/bed transfer details: Verbal cues for precautions/safety  Function - Locomotion: Wheelchair Will patient use wheelchair at discharge?: No Type: Manual Max wheelchair distance: 30 Assist  Level: Touching or steadying assistance (Pt > 75%) Assist Level: Touching or steadying assistance (Pt > 75%) Wheel 150 feet activity did not occur: Safety/medical concerns Turns around,maneuvers to table,bed, and toilet,negotiates 3% grade,maneuvers on rugs and over doorsills: No Function - Locomotion: Ambulation Assistive device: Walker-rolling Max distance: 25' Assist level: Touching or steadying assistance (Pt > 75%) Assist level: Touching or steadying assistance (Pt > 75%) Walk 50 feet with 2 turns activity did not occur: Safety/medical concerns Assist level: Touching or steadying assistance (Pt > 75%) Walk 150 feet activity did not occur: Safety/medical concerns Walk 10 feet on uneven surfaces activity did not occur: Safety/medical concerns  Function - Comprehension Comprehension: Auditory Comprehension assist level: Understands basic 90% of the time/cues < 10% of the time  Function - Expression Expression: Verbal Expression assistive device: Talk trach valve Expression assist level: Expresses basic 90% of the time/requires  cueing < 10% of the time.  Function - Social Interaction Social Interaction assist level: Interacts appropriately 75 - 89% of the time - Needs redirection for appropriate language or to initiate interaction.  Function - Problem Solving Problem solving assist level: Solves basic 75 - 89% of the time/requires cueing 10 - 24% of the time  Function - Memory Memory assist level: Recognizes or recalls 50 - 74% of the time/requires cueing 25 - 49% of the time Patient normally able to recall (first 3 days only): Current season, Staff names and faces, That he or she is in a hospital  Medical Problem List and Plan: 1. 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  Team conference today 2. DVT Prophylaxis/Anticoagulation: Pharmaceutical: Xarelto 3. Pain Management: acetaminophen 4. Mood:  Continued ego support fRom team for anxiety    -Ativan prn.  Wean to Xanax eventually   -Anxiety impacting activity at times. 5. Neuropsych: This patient is capable of making decisions on her own behalf. 6. Skin/Wound Care: Prophyllactic foam dressing to sacrum 7. Fluids/Electrolytes/Nutrition: carb mod diet 8. 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 Change to po, no seizures since rehab admit 9. VDRF/respiratory: Patient is due for a trach change today.  We will go ahead and downsize her to a #4 cuffless Shiley trach.  Continue Passy-Muir valve during the day    -Patient receiving scheduled and as needed nebulizers    -We will check chest x-ray as well today   -Added incentive spirometer and flutter valve 10. 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      LOS (Days) 4 A FACE TO FACE EVALUATION WAS PERFORMED  Faiza Bansal T 06/27/2017, 9:02 AM

## 2017-06-27 NOTE — Progress Notes (Signed)
Speech Language Pathology Daily Session Note  Patient Details  Name: Amanda Short MRN: [id number] Date of Birth: Jan 23, 1941  Today's Date: 06/27/2017 SLP Individual Time: 1000-1045 SLP Individual Time Calculation (min): 45 min  Missed Time: 15 minus due to X-ray  Short Term Goals: Week 1: SLP Short Term Goal 1 (Week 1): Patient will donn and doff PMSV with Min assist verbal and visual cues  SLP Short Term Goal 2 (Week 1): Patient will utilize external aids to assist with recall of new information with Min assist question cues  SLP Short Term Goal 3 (Week 1): Patient will complete complex problem solving tasks with Min assist verbal, question cues to self-monitor and correct errors  Skilled Therapeutic Interventions: Skilled treatment session focused on cognitive goals. SLP facilitated session by providing Max A verbal cues for functional problem solving during a mildly complex money management task. Patient's husband present and reported the patient appeared more lethargic and confused today, educated husband that I suspect cognitive change is due to lack of sleep and increased medications per RN note from last night. He verbalized understanding.  transport team arrived to take patient to x-ray and patient required Max A verbal cues for problem solving/sequencing during transfer from wheelchair to bed. Patient handed off to transport. Continue with current plan of care.      Function:   Cognition Comprehension Comprehension assist level: Understands basic 75 - 89% of the time/ requires cueing 10 - 24% of the time  Expression Expression assistive device: Talk trach valve Expression assist level: Expresses complex 90% of the time/cues < 10% of the time  Social Interaction Social Interaction assist level: Interacts appropriately 75 - 89% of the time - Needs redirection for appropriate language or to initiate interaction.  Problem Solving Problem solving assist level: Solves basic 25 - 49%  of the time - needs direction more than half the time to initiate, plan or complete simple activities  Memory Memory assist level: Recognizes or recalls 25 - 49% of the time/requires cueing 50 - 75% of the time    Pain Pain Assessment Pain Assessment: No/denies pain  Therapy/Group: Individual Therapy  Giannie Soliday 06/27/2017, 12:51 PM

## 2017-06-27 NOTE — Progress Notes (Signed)
Social Work Patient ID: Amanda Short, female   DOB: 19-Oct-1940, 76 y.o.   MRN: [id number]   Met with pt and spouse following team conference.  They are aware and agreeable with targeted d/c date of 12/20 and supervision goals.  Please with progress to date and deny any questions/ concerns.  Will continue to follow.  Venie Montesinos, LCSW

## 2017-06-27 NOTE — Progress Notes (Signed)
Patient called multiple times with what appeared to be anxiety. Ativan was not due at the time, she had received a dose earlier in the shift. From what staff stated, patient usually has these symptoms at this time every night & then she finally goes to sleep. Patient c/o shortness of breath even though O2 satsuration was between 92-98% on trach collar at 35% O2. Respiratory therapy ad already seen her, suctioned her and done trach care. Patient did not sound like she needed repeat suctioning but c/o feeling congested. She tried to cough & clear it but it was not productive. Lungs sounded congested & wheezy with expiration. At this time, she could receive another ativan for anxiety. She was also given a nebulizer treatment and O2 was placed back at 40% as ordered. Right now, during treatment, patient is at 100% O2 saturation, pulse ranging between 56-61 bpm.. Will complete treatment & reassess.

## 2017-06-27 NOTE — Progress Notes (Signed)
Attempted trach change at this time to #4 cuffless Shiley. Unable to change trach due to inability to remove without resistance. Attempted to inflate cuff as well as lubricate with normal saline around stoma. This was attempted a couple of times. Current trach was resecured. Olin Hauser PA notified and said she would place a consult for pulmonary. Cuff is currently deflated with PMV in place, SpO2 95%, HR 60. No distress noted.

## 2017-06-27 NOTE — Progress Notes (Signed)
Occupational Therapy Session Note  Patient Details  Name: Amanda Short MRN: [id number] Date of Birth: May 22, 1941  Today's Date: 06/27/2017 OT Individual Time: 1683-7290 OT Individual Time Calculation (min): 74 min    Short Term Goals: Week 1:  OT Short Term Goal 1 (Week 1): Pt will complete LB dressing with Mod A OT Short Term Goal 2 (Week 1): Pt will complete sit<stand during bathing with Min A OT Short Term Goal 3 (Week 1): Pt will complete BSC/toilet transfer with Min A and 02 sats >90%  Skilled Therapeutic Interventions/Progress Updates:    Upon entering the room, pt supine in bed with husband present. Pt requesting to wash and change clothing. Pt performed supine >sit with min A to EOB. Pt performed stand pivot transfer with RW into wheelchair with min A for balance. Pt bathing at sink with min A for UB self care to pull shirt over neck. Pt requiring multiple rest breaks throughout session secondary to fatigue. Pt standing from wheelchair to wash buttocks and peri area with min A for standing balance. Pt threading clothing onto B LE's and required assistance to pull over B hips while standing with min A balance. Pt transferred back to bed with min A and min A to place R LE onto bed with sit >supine. Pt repositioned in bed with call bell and all needed items within reach. OT provided pt and caregiver with paper handout regarding energy conservation and requested them to review together and discuss at next session. Bed alarm activated and call bell within reach upon exiting the room.   Therapy Documentation Precautions:  Precautions Precautions: Fall Precaution Comments: trach + PMSV, monitor 02 Restrictions Weight Bearing Restrictions: No General:   Vital Signs: Therapy Vitals Temp: 98.2 F (36.8 C) Temp Source: Oral Pulse Rate: 65 Resp: 17 BP: (!) 145/68 Patient Position (if appropriate): Lying Oxygen Therapy SpO2: 98 % O2 Device: Tracheostomy Collar O2 Flow Rate  (L/min): 5 L/min FiO2 (%): 28 % Pain:   ADL: ADL ADL Comments: Please see functional navigator for ADL status Vision   Perception    Praxis   Exercises:   Other Treatments:    See Function Navigator for Current Functional Status.   Therapy/Group: Individual Therapy  Gypsy Decant 06/27/2017, 4:53 PM

## 2017-06-27 NOTE — Progress Notes (Addendum)
Physical Therapy Note  Patient Details  Name: Amanda Short MRN: [id number] Date of Birth: [birth year] Today's Date: 06/27/2017  0850-0950, 60 min individual tx Pain: none per pt  Bed mobility with bed features to sit EOb.  PT donned pt's pants in supine and standing.  Stand pivot bed> w/c to L with min assist. On 5L O2 at wall; O2 sats dropped to 82% intermittently, increasing to >85% with cues for slow deep breathing.  Seated Therapeutic exercise performed with LE to increase strength for functional mobility: 20 x 1 R/L heel raises, toe raises. Pt demonstrated possible motor planning problems for alternating movements.   On O2 bottle, 40% on 5L.  O2 sats stayed above 85%. Therapeutic activity in sitting (with neuro re-ed via positioning wedge under hips to decrease posterior pelvic tilt) reaching laterally and forward to match playing cards on board.  VCs for cervical and trunk rotation, 80% accuracy matching. Mod cues for simple math to count # of cards on a sheet.  Pt demonstrated limited active L shoulder flexion and poor problem solving to place cards on board.  Gait training with RW x 30' before fatiguing suddenly, min guard assist.  Pt demonstrates short, shuffling steps, L lateral lean. Pt left resting in w/c with all needs within reach; husband present.  See function navigator for current status.   Sweden Lesure 06/27/2017, 8:00 AM

## 2017-06-28 ENCOUNTER — Inpatient Hospital Stay (HOSPITAL_COMMUNITY): Payer: Medicare Other

## 2017-06-28 ENCOUNTER — Inpatient Hospital Stay (HOSPITAL_COMMUNITY): Payer: Medicare Other | Admitting: Occupational Therapy

## 2017-06-28 ENCOUNTER — Inpatient Hospital Stay (HOSPITAL_COMMUNITY): Payer: Medicare Other | Admitting: Speech Pathology

## 2017-06-28 ENCOUNTER — Encounter: Payer: Self-pay | Admitting: Neurology

## 2017-06-28 ENCOUNTER — Encounter (HOSPITAL_COMMUNITY): Payer: Self-pay

## 2017-06-28 LAB — GLUCOSE, CAPILLARY
GLUCOSE-CAPILLARY: 175 mg/dL — AB (ref 65–99)
GLUCOSE-CAPILLARY: 188 mg/dL — AB (ref 65–99)
Glucose-Capillary: 152 mg/dL — ABNORMAL HIGH (ref 65–99)
Glucose-Capillary: 176 mg/dL — ABNORMAL HIGH (ref 65–99)

## 2017-06-28 LAB — URINALYSIS, COMPLETE (UACMP) WITH MICROSCOPIC
BILIRUBIN URINE: NEGATIVE
GLUCOSE, UA: NEGATIVE mg/dL
HGB URINE DIPSTICK: NEGATIVE
Ketones, ur: NEGATIVE mg/dL
LEUKOCYTES UA: NEGATIVE
NITRITE: NEGATIVE
PROTEIN: 100 mg/dL — AB
Specific Gravity, Urine: 1.017 (ref 1.005–1.030)
pH: 8 (ref 5.0–8.0)

## 2017-06-28 MED ORDER — QUETIAPINE FUMARATE 25 MG PO TABS
25.0000 mg | ORAL_TABLET | Freq: Every day | ORAL | Status: DC
  Administered 2017-06-28 – 2017-07-04 (×7): 25 mg via ORAL
  Filled 2017-06-28 (×7): qty 1

## 2017-06-28 MED ORDER — ALPRAZOLAM 0.25 MG PO TABS
0.2500 mg | ORAL_TABLET | Freq: Three times a day (TID) | ORAL | Status: DC | PRN
  Administered 2017-06-28 – 2017-07-04 (×7): 0.25 mg via ORAL
  Filled 2017-06-28 (×8): qty 1

## 2017-06-28 NOTE — Progress Notes (Signed)
Speech Language Pathology Daily Session Note  Patient Details  Name: Amanda Short MRN: [id number] Date of Birth: 10/14/1940  Today's Date: 06/28/2017 SLP Individual Time: 1455-1515 SLP Individual Time Calculation (min): 20 min  Short Term Goals: Week 1: SLP Short Term Goal 1 (Week 1): Patient will donn and doff PMSV with Min assist verbal and visual cues  SLP Short Term Goal 2 (Week 1): Patient will utilize external aids to assist with recall of new information with Min assist question cues  SLP Short Term Goal 3 (Week 1): Patient will complete complex problem solving tasks with Min assist verbal, question cues to self-monitor and correct errors  Skilled Therapeutic Interventions:  Pt was seen for skilled ST session to make up time for missed minutes yesterday.  Pt could recall activities from previous therapy sessions with min question cues.  SLP provided min assist verbal cues to aid pt in recording information regarding therapy sessions into her memory notebook.  Pt could also recall proper technique for donning and doffing speaking valve with supervision.  When actually donning and doffing valve pt needed min faded to supervision cues to utilize proper technique.  Pt was left in wheelchair with call bell within reach and husband at bedside.  Continue per current plan of care.    Function:  Eating Eating                 Cognition Comprehension Comprehension assist level: Understands basic 90% of the time/cues < 10% of the time  Expression Expression assistive device: Talk trach valve Expression assist level: Expresses complex 90% of the time/cues < 10% of the time  Social Interaction Social Interaction assist level: Interacts appropriately 75 - 89% of the time - Needs redirection for appropriate language or to initiate interaction.  Problem Solving Problem solving assist level: Solves basic 50 - 74% of the time/requires cueing 25 - 49% of the time  Memory Memory assist  level: Recognizes or recalls 25 - 49% of the time/requires cueing 50 - 75% of the time    Pain Pain Assessment Pain Assessment: No/denies pain  Therapy/Group: Individual Therapy  Shandie Bertz, Selinda Orion 06/28/2017, 4:26 PM

## 2017-06-28 NOTE — Progress Notes (Signed)
Occupational Therapy Session Note  Patient Details  Name: Amanda Short MRN: [id number] Date of Birth: 1940/09/25  Today's Date: 06/28/2017 OT Individual Time: 8159-4707 OT Individual Time Calculation (min): 60 min    Short Term Goals: Week 1:  OT Short Term Goal 1 (Week 1): Pt will complete LB dressing with Mod A OT Short Term Goal 2 (Week 1): Pt will complete sit<stand during bathing with Min A OT Short Term Goal 3 (Week 1): Pt will complete BSC/toilet transfer with Min A and 02 sats >90%  Skilled Therapeutic Interventions/Progress Updates:    Upon entering the room, pt supine in bed with husband present. Pt with no c/o pain this session and agreeable to OT intervention. Pt verbalizing she had urinated and bed was wet. OT educated pt on notifying staff if she has an accident secondary to skin integrity concerns. Pt verbalized understanding. Pt washing peri area with set up A for supine position and rolling L <> R to remove soiled brief and don clean one. Pt performed supine >sit with min A and worked on threading pants from seated position on EOB. Pt standing with min A and able to pull pants over B hips this session with min A for standing balance. Pt transferred into wheelchair with use of RW and min A. Pt engaged in grooming tasks from seated position with set up A from wheelchair at sink. Pt engaged in B UE strengthening exercises with use of 1 lb dowel rod. OT demonstrated exercises and pt returned demonstrations with min verbal cues for proper technique. Pt performed 3 sets of 10 chest pulls, bicep curls, forward rows, reverse rows, and straight arm raises with rest breaks between each set. Pt remained seated in wheelchair at end of session with husband present and all needs within reach.   Therapy Documentation Precautions:  Precautions Precautions: Fall Precaution Comments: trach + PMSV, monitor 02 Restrictions Weight Bearing Restrictions: No General:   Vital  Signs:  Pain:   ADL: ADL ADL Comments: Please see functional navigator for ADL status Vision   Perception    Praxis   Exercises:   Other Treatments:    See Function Navigator for Current Functional Status.   Therapy/Group: Individual Therapy  Gypsy Decant 06/28/2017, 12:43 PM

## 2017-06-28 NOTE — Progress Notes (Signed)
Speech Language Pathology Daily Session Note  Patient Details  Name: Amanda Short MRN: [id number] Date of Birth: [birth year]  Today's Date: 06/28/2017 SLP Individual Time: 1000-1100 SLP Individual Time Calculation (min): 60 min  Short Term Goals: Week 1: SLP Short Term Goal 1 (Week 1): Patient will donn and doff PMSV with Min assist verbal and visual cues  SLP Short Term Goal 2 (Week 1): Patient will utilize external aids to assist with recall of new information with Min assist question cues  SLP Short Term Goal 3 (Week 1): Patient will complete complex problem solving tasks with Min assist verbal, question cues to self-monitor and correct errors  Skilled Therapeutic Interventions: Skilled treatment session focused on cognitive goals. SLP facilitated session by providing Mod A verbal cues and extra time for problem solving, recall and organization during a basic money management task. SLP also facilitated by providing Max A verbal cues for problem solving in regards to utilizing schedule to recall and anticipate upcoming therapy sessions. SLP initiated a memory book to maximize recall and carryover of daily information.  Patient left upright in wheelchair with family present. Continue with current plan of care.      Function:   Cognition Comprehension Comprehension assist level: Understands basic 75 - 89% of the time/ requires cueing 10 - 24% of the time  Expression Expression assistive device: Talk trach valve Expression assist level: Expresses complex 90% of the time/cues < 10% of the time  Social Interaction Social Interaction assist level: Interacts appropriately 75 - 89% of the time - Needs redirection for appropriate language or to initiate interaction.  Problem Solving Problem solving assist level: Solves basic 50 - 74% of the time/requires cueing 25 - 49% of the time  Memory Memory assist level: Recognizes or recalls 25 - 49% of the time/requires cueing 50 - 75% of the time     Pain No/Denies Pain   Therapy/Group: Individual Therapy  Zyshonne Malecha 06/28/2017, 3:22 PM

## 2017-06-28 NOTE — Progress Notes (Signed)
Subjective/Complaints: Again struggle with anxiety last night.  It seems to be worse later in the evening.  Sometimes she struggles with her breathing as well, but for the most part her breathing is improving.  She tolerated the downsize to a #4 trach well  ROS: pt denies nausea, vomiting, diarrhea, cough, shortness of breath or chest pain   Objective: Vital Signs: Blood pressure (!) 152/87, pulse 60, temperature 97.9 F (36.6 C), temperature source Oral, resp. rate 16, height _0  (1.549 m), weight 77 kg (169 lb 12.1 oz), SpO2 96 %. Dg Chest 2 View  Result Date: 06/27/2017 CLINICAL DATA:  Cough for a few days.  History of tracheotomy. EXAM: CHEST  2 VIEW COMPARISON:  06/14/2017 FINDINGS: Small bilateral pleural effusion. No superimposed airspace opacity or edema. Chronic cardiomegaly. Calcified granuloma over the right lower lung. A tracheostomy tube remains well seated. Feeding tube and IJ catheter have been removed. 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 IMPRESSION: Small layering pleural effusions.  No noted pneumonia or edema. Electronically Signed   By: Monte Fantasia M.D.   On: 06/27/2017 11:12   Results for orders placed or performed during the hospital encounter of 06/23/17 (from the past 72 hour(s))  Glucose, capillary     Status: Abnormal   Collection Time: 06/25/17 11:47 AM  Result Value Ref Range   Glucose-Capillary 165 (H) 65 - 99 mg/dL  Glucose, capillary     Status: Abnormal   Collection Time: 06/25/17  4:30 PM  Result Value Ref Range   Glucose-Capillary 160 (H) 65 - 99 mg/dL  Glucose, capillary     Status: Abnormal   Collection Time: 06/25/17  9:40 PM  Result Value Ref Range   Glucose-Capillary 159 (H) 65 - 99 mg/dL   Comment 1 Notify RN   Basic metabolic panel     Status: Abnormal   Collection Time: 06/26/17  4:29 AM  Result Value Ref Range   Sodium 138 135 - 145 mmol/L   Potassium 4.0 3.5 - 5.1 mmol/L   Chloride 96 (L)  101 - 111 mmol/L   CO2 34 (H) 22 - 32 mmol/L   Glucose, Bld 171 (H) 65 - 99 mg/dL   BUN 9 6 - 20 mg/dL   Creatinine, Ser 0.85 0.44 - 1.00 mg/dL   Calcium 9.1 8.9 - 10.3 mg/dL   GFR calc non Af Amer >60 >60 mL/min   GFR calc Af Amer >60 >60 mL/min    Comment: (NOTE) The eGFR has been calculated using the CKD EPI equation. This calculation has not been validated in all clinical situations. eGFR's persistently <60 mL/min signify possible Chronic Kidney Disease.    Anion gap 8 5 - 15  CBC     Status: Abnormal   Collection Time: 06/26/17  4:29 AM  Result Value Ref Range   WBC 7.4 4.0 - 10.5 K/uL   RBC 3.46 (L) 3.87 - 5.11 MIL/uL   Hemoglobin 10.7 (L) 12.0 - 15.0 g/dL   HCT 35.1 (L) 36.0 - 46.0 %   MCV 101.4 (H) 78.0 - 100.0 fL   MCH 30.9 26.0 - 34.0 pg   MCHC 30.5 30.0 - 36.0 g/dL   RDW 14.9 11.5 - 15.5 %   Platelets 354 150 - 400 K/uL  Glucose, capillary     Status: Abnormal   Collection Time: 06/26/17  7:28 AM  Result Value Ref Range   Glucose-Capillary 165 (H) 65 - 99 mg/dL  Glucose, capillary  Status: Abnormal   Collection Time: 06/26/17 12:07 PM  Result Value Ref Range   Glucose-Capillary 131 (H) 65 - 99 mg/dL  Glucose, capillary     Status: Abnormal   Collection Time: 06/26/17  4:15 PM  Result Value Ref Range   Glucose-Capillary 185 (H) 65 - 99 mg/dL  Glucose, capillary     Status: Abnormal   Collection Time: 06/26/17  9:00 PM  Result Value Ref Range   Glucose-Capillary 230 (H) 65 - 99 mg/dL  Glucose, capillary     Status: Abnormal   Collection Time: 06/27/17  6:44 AM  Result Value Ref Range   Glucose-Capillary 199 (H) 65 - 99 mg/dL  Glucose, capillary     Status: Abnormal   Collection Time: 06/27/17 11:48 AM  Result Value Ref Range   Glucose-Capillary 192 (H) 65 - 99 mg/dL  Glucose, capillary     Status: Abnormal   Collection Time: 06/27/17  4:42 PM  Result Value Ref Range   Glucose-Capillary 128 (H) 65 - 99 mg/dL  Glucose, capillary     Status: Abnormal    Collection Time: 06/27/17  9:04 PM  Result Value Ref Range   Glucose-Capillary 192 (H) 65 - 99 mg/dL  Glucose, capillary     Status: Abnormal   Collection Time: 06/28/17  6:14 AM  Result Value Ref Range   Glucose-Capillary 175 (H) 65 - 99 mg/dL     HEENT: trach #4  Shiley, with Passy-Muir valve in place.  No secretions Cardio: RRR without murmur. No JVD    Resp: Improved breath sounds today with no rhonchi or rales.  Minimal expiratory wheezes auscultated GI: BS positive and NT, ND Extremity:   Trace lower extremity edema.  Skin warm, pulses intact Skin:   Intact and Other trach site as above Neuro: Alert/Oriented, Normal Sensory and Abnormal Motor 4/5 in BUE and BLE Musc/Skel:  Normal Psych: Patient generally pleasant and cooperative.  She is slightly anxious per baseline   Assessment/Plan: 1. Functional deficits secondary to Debilty after VDRF which require 3+ hours per day of interdisciplinary therapy in a comprehensive inpatient rehab setting. Physiatrist is providing close team supervision and 24 hour management of active medical problems listed below. Physiatrist and rehab team continue to assess barriers to discharge/monitor patient progress toward functional and medical goals. FIM: Function - Bathing Position: Wheelchair/chair at sink Body parts bathed by patient: Right arm, Left arm, Chest, Abdomen, Front perineal area, Right upper leg, Left upper leg, Buttocks Body parts bathed by helper: Right lower leg, Left lower leg, Back Assist Level: Touching or steadying assistance(Pt > 75%)  Function- Upper Body Dressing/Undressing What is the patient wearing?: Pull over shirt/dress Bra - Perfomed by helper: Thread/unthread right bra strap, Thread/unthread left bra strap, Hook/unhook bra (pull down sports bra) Pull over shirt/dress - Perfomed by patient: Thread/unthread right sleeve, Thread/unthread left sleeve, Pull shirt over trunk Pull over shirt/dress - Perfomed by helper: Put  head through opening Assist Level: Touching or steadying assistance(Pt > 75%) Function - Lower Body Dressing/Undressing What is the patient wearing?: Underwear, Pants Position: Wheelchair/chair at sink Underwear - Performed by patient: Thread/unthread right underwear leg, Thread/unthread left underwear leg Underwear - Performed by helper: Pull underwear up/down Pants- Performed by patient: Thread/unthread right pants leg, Thread/unthread left pants leg Pants- Performed by helper: Pull pants up/down Non-skid slipper socks- Performed by helper: Don/doff right sock, Don/doff left sock Assist for footwear: Dependant Assist for lower body dressing: (mod A)  Function - Toileting Toileting steps completed  by patient: Performs perineal hygiene Toileting steps completed by helper: Adjust clothing prior to toileting, Performs perineal hygiene, Adjust clothing after toileting Toileting Assistive Devices: Grab bar or rail Assist level: Touching or steadying assistance (Pt.75%)  Function - Toilet Transfers Toilet transfer assistive device: Bedside commode Assist level to bedside commode (at bedside): Moderate assist (Pt 50 - 74%/lift or lower) Assist level from bedside commode (at bedside): Moderate assist (Pt 50 - 74%/lift or lower)  Function - Chair/bed transfer Chair/bed transfer method: Stand pivot Chair/bed transfer assist level: Touching or steadying assistance (Pt > 75%) Chair/bed transfer assistive device: Armrests Chair/bed transfer details: Verbal cues for precautions/safety, Verbal cues for technique  Function - Locomotion: Wheelchair Will patient use wheelchair at discharge?: No Type: Manual Max wheelchair distance: 30 Assist Level: Touching or steadying assistance (Pt > 75%) Assist Level: Touching or steadying assistance (Pt > 75%) Wheel 150 feet activity did not occur: Safety/medical concerns Turns around,maneuvers to table,bed, and toilet,negotiates 3% grade,maneuvers on rugs  and over doorsills: No Function - Locomotion: Ambulation Assistive device: Walker-rolling Max distance: 25 Assist level: Touching or steadying assistance (Pt > 75%) Assist level: Touching or steadying assistance (Pt > 75%) Walk 50 feet with 2 turns activity did not occur: Safety/medical concerns Assist level: Touching or steadying assistance (Pt > 75%) Walk 150 feet activity did not occur: Safety/medical concerns Walk 10 feet on uneven surfaces activity did not occur: Safety/medical concerns  Function - Comprehension Comprehension: Auditory Comprehension assist level: Understands basic 75 - 89% of the time/ requires cueing 10 - 24% of the time  Function - Expression Expression: Verbal Expression assistive device: Talk trach valve Expression assist level: Expresses complex 90% of the time/cues < 10% of the time  Function - Social Interaction Social Interaction assist level: Interacts appropriately 75 - 89% of the time - Needs redirection for appropriate language or to initiate interaction.  Function - Problem Solving Problem solving assist level: Solves basic 25 - 49% of the time - needs direction more than half the time to initiate, plan or complete simple activities  Function - Memory Memory assist level: Recognizes or recalls 25 - 49% of the time/requires cueing 50 - 75% of the time Patient normally able to recall (first 3 days only): Current season, Staff names and faces, That he or she is in a hospital  Medical Problem List and Plan: 1. 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 DVT Prophylaxis/Anticoagulation: Pharmaceutical: Xarelto 3. Pain Management: acetaminophen 4. Mood/anxiety:  Continued ego support fRom team for anxiety    -Stop Ativan and change Xanax to 0.25 every 8 hours as needed    -We will begin low-dose Seroquel to assist with sleep 25 mg at 9 PM nightly     5. Neuropsych: This patient is capable of making decisions on  her own behalf. 6. Skin/Wound Care: Prophyllactic foam dressing to sacrum 7. Fluids/Electrolytes/Nutrition: carb mod diet 8. 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 Change to po, no seizures since rehab admit 9. VDRF/respiratory: Downsized to #4 cuff less Shiley trach without issue   -Continue scheduled and as needed nebulizers    -Chest x-ray personally reviewed and only reveals pleural effusions, no acute changes      -continue incentive spirometer and flutter valve 10. 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      LOS (Days) 5 A FACE TO FACE EVALUATION WAS PERFORMED  SWARTZ,ZACHARY T 06/28/2017, 9:10 AM

## 2017-06-28 NOTE — Progress Notes (Signed)
Physical Therapy Session Note  Patient Details  Name: Amanda Short MRN: [id number] Date of Birth: 1940/12/14  Today's Date: 06/28/2017 PT Individual Time:Session1: 1130-1200; Session2: 1515-1600 PT Individual Time Calculation (min): 30 min & 45 min  Short Term Goals: Week 1:  PT Short Term Goal 1 (Week 1): Pt will ambulate 100 ft with RW and min assist PT Short Term Goal 2 (Week 1): Pt will perform bed mobility with supervision  PT Short Term Goal 3 (Week 1): Pt will tolerate 10 minutes of activity OOB without rest break  Skilled Therapeutic Interventions/Progress Updates:    Session1:  Patient in w/c on 28% TC.  Sit to stand with RW and minguard A, ambulated 56' with minA and spouse following with w/c.  She transferred to therapy mat in gym min A no device.  Sit to supine min A and performed therex consisting of bridging, lateral trunk rotation, hooklying marching, SLR, hooklying hip abduction with yellow t-band and partial curl ups all x 5-10 reps.  Patient rolled to L with S and side to sit min A with cues and time.  Stood and ambulated another 37' with min A.  Patient pushed in chair to her room, left with spouse and all needs in reach.   Sesssion2: Patient in w/c in room with spouse present.  Stood min A and ambulated x 60' with min A.  Negotiated 4 6" steps with rails and min A increased time and standing rest breaks.  SpO2 88% on 28% TC after stair negotiation.  After rest x 2 minutes up to 93%.  Ambulated 69' with RW and min A.  Patient assisted to dayroom and to Nu Step for UE/LE endurance activity x 5 minutes at level 2.  Patient stand turn to chair min A and assisted to room with spouse present and all needs in reach.  Therapy Documentation Precautions:  Precautions Precautions: Fall Precaution Comments: trach + PMSV, monitor 02 Restrictions Weight Bearing Restrictions: No Pain: Pain Assessment Pain Assessment: No/denies pain   See Function Navigator for Current  Functional Status.   Therapy/Group: Individual Therapy  Reginia Naas 06/28/2017, 6:19 PM

## 2017-06-29 ENCOUNTER — Inpatient Hospital Stay (HOSPITAL_COMMUNITY): Payer: Medicare Other | Admitting: Speech Pathology

## 2017-06-29 ENCOUNTER — Inpatient Hospital Stay (HOSPITAL_COMMUNITY): Payer: Medicare Other

## 2017-06-29 ENCOUNTER — Inpatient Hospital Stay (HOSPITAL_COMMUNITY): Payer: Medicare Other | Admitting: Occupational Therapy

## 2017-06-29 LAB — GLUCOSE, CAPILLARY
GLUCOSE-CAPILLARY: 178 mg/dL — AB (ref 65–99)
Glucose-Capillary: 169 mg/dL — ABNORMAL HIGH (ref 65–99)

## 2017-06-29 NOTE — Progress Notes (Signed)
Physical Therapy Session Note  Patient Details  Name: Amanda Short MRN: [id number] Date of Birth: Dec 07, 1940  Today's Date: 06/29/2017 PT Individual Time:Session1: 0930-1030; Session2: 1600 1630 PT Individual Time Calculation (min): 60 min   Short Term Goals: Week 1:  PT Short Term Goal 1 (Week 1): Pt will ambulate 100 ft with RW and min assist PT Short Term Goal 2 (Week 1): Pt will perform bed mobility with supervision  PT Short Term Goal 3 (Week 1): Pt will tolerate 10 minutes of activity OOB without rest break  Skilled Therapeutic Interventions/Progress Updates:    Session1: Patient seen with spouse present and pushed to dayroom.  Performed standing endurance activity with pushpin art x 8 minutes without seated rest.  Patient ambulated in carpeted environment with furniture x 75' with cues for step height.  In dayroom for gait around furniture with RW and educated how to maneuver in small space with walker, but pt c/o dizziness looking down so seated rest and maneuvered rest of the way around obstacles with min A. Seated endurance activity on Nu Step x 5 min with UE and LE at level 1 cues for steps > 40 per min.  Patient assisted in char back to room and stand pivot to bed min a no device.  Sit to supine min A and cues for positioning.  Left with spouse present and all needs in reach.  [place]:  Patient in supine to perform core strengthening and LE therex as noted below.  Supine to sit min A mod cues with rail and increased time.  Gait x 80' and 40' with RW and min in room.  Patient to supine min a and left with bed alarm and all needs in reach.  SpO2 throughout sessions today with 2 L [place] an plugged trach 92% or above.   Therapy Documentation Precautions:  Precautions Precautions: Fall Precaution Comments: trach + PMSV, monitor 02 Restrictions Weight Bearing Restrictions: No Pain: Pain Assessment Pain Assessment: No/denies pain Exercises: General Exercises - Lower  Extremity Short Arc Quad: Strengthening;Both;10 reps;Supine Heel Slides: Strengthening;Both;Seated;10 reps Hip ABduction/ADduction: Strengthening;10 reps;Supine(hooklying with yellow t-band) Straight Leg Raises: Strengthening;Both;10 reps;Supine Hip Flexion/Marching: Strengthening;Both;10 reps;Supine(hooklying) Other Exercises Other Exercises: bridging x 10  Other Exercises: lateral trunk rotation x 10 Other Exercises: curl ups x 10 Other Treatments:     See Function Navigator for Current Functional Status.   Therapy/Group: Individual Therapy  Reginia Naas 06/29/2017, 4:20 PM

## 2017-06-29 NOTE — Progress Notes (Signed)
Nurse tech called this nurse stating that the patient had pulled her trach collar off & the trach was sticking out. When noted, the patient had pulled off her collar & the whole trach was out of the stoma & laying, still attached to the trach ties. Patient was still alert & did not show any signs of respiratory distress. The new trach was obtained from the bedside & inserted without difficulty. Lurline Idol was secured with existing ties, which were clean & intact.Patient asked for this nurse to remove the ties, but it was explained to the patient that the ties was the only thing holding the trach in place. She has a cuffless trach that was downsized yesterday from a #6 to a #4. Patient verbalized understanding. Oxygen saturation was up & down, but patient was getting out of bed to use the bedside commode. Respiratory was called to assess the placement and site. When therapist arrived, patient's oxygen saturation was wnl. No signs of SOB, no tachycardia & there was no c/o pain from the site. Patient is talking & stated that she had a bad dream & apologized multiple times. Patient was assured that it was alright & that she probably woke up not realizing what it was & just pulled off the collar, which was noted on the floor when the  Tech came into the room. Patient had just started a new medication & had slept well for the night. Restlessness was reduced from the previous 2 nights in which she also was noted pulling off her collar & asking for the trach to be removed. Patient noted resting. No signs of distress. Will continue to monitor.

## 2017-06-29 NOTE — Progress Notes (Signed)
Occupational Therapy Session Note  Patient Details  Name: Amanda Short MRN: [id number] Date of Birth: Dec 06, 1940  Today's Date: 06/29/2017 OT Individual Time: 1301-1416 OT Individual Time Calculation (min): 75 min   Short Term Goals: Week 1:  OT Short Term Goal 1 (Week 1): Pt will complete LB dressing with Mod A OT Short Term Goal 2 (Week 1): Pt will complete sit<stand during bathing with Min A OT Short Term Goal 3 (Week 1): Pt will complete BSC/toilet transfer with Min A and 02 sats >90%  Skilled Therapeutic Interventions/Progress Updates:    Tx focus on standing tolerance, functional ambulation with device, and stable vitals during engagement in functional activities.   Pt greeted supine in bed, declining ADLs but agreeable to tx with no c/o pain. After consultation with RN, caregiver training completed with spouse Sid. Worked on [facility] and w/c transfers via stand pivot with and without RW. Educated Sid on [employer], as well as cues to provide for safe DME mgt and proper hand placement. Also discussed giving pt increased time to process cues and problem solve herself instead of having her follow step by step cues. Pt was then escorted to dayroom. 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 Longest standing time without rest during crocheting 7 minutes, cues for upright posture and for minimizing bilateral forearm support on elevated table. She also required cuing for mgt of RW and hand placement during sit<stand transitions and while side-stepping in front of tables when washing them. Educated pt/spouse on importance of giving her meaningful IADL roles at home to enhance psychosocial health and to maintain current functional level post d/c. Also initiated education with Sid regarding w/c parts mgt. At end of session pt was escorted back to room and left in w/c with spouse present.    02 sats during activity 99-100% on 2L supplemental 02.  RN made aware.  Therapy Documentation Precautions:  Precautions Precautions: Fall Precaution Comments: trach + PMSV, monitor 02 Restrictions Weight Bearing Restrictions: No Vital Signs: Therapy Vitals Temp: 99.1 F (37.3 C) Temp Source: Oral Pulse Rate: 60 Resp: 16 BP: (!) 142/56 Patient Position (if appropriate): Lying Oxygen Therapy SpO2: 100 % O2 Device: Nasal Cannula ADL: ADL ADL Comments: Please see functional navigator for ADL status     See Function Navigator for Current Functional Status.   Therapy/Group: Individual Therapy  Tanda Morrissey A Albertia Carvin 06/29/2017, 4:03 PM

## 2017-06-29 NOTE — Progress Notes (Signed)
RN called RT stated pt had pulled out trach and RN put back in.  RT arrived to confirm placement.  PT alert sitting up on bed CO2 detector placed on trach positive color change noted.  Bilateral BS noted pt satting 95% on 35% trach collar HR 61.  [name]

## 2017-06-29 NOTE — Progress Notes (Signed)
Speech Language Pathology Daily Session Note  Patient Details  Name: Amanda Short MRN: [id number] Date of Birth: Jul 05, 1941  Today's Date: 06/29/2017 SLP Individual Time: 1100-1200 SLP Individual Time Calculation (min): 60 min  Short Term Goals: Week 1: SLP Short Term Goal 1 (Week 1): Patient will donn and doff PMSV with Min assist verbal and visual cues  SLP Short Term Goal 2 (Week 1): Patient will utilize external aids to assist with recall of new information with Min assist question cues  SLP Short Term Goal 3 (Week 1): Patient will complete complex problem solving tasks with Min assist verbal, question cues to self-monitor and correct errors  Skilled Therapeutic Interventions: Skilled treatment session focused on cognitive goals. SLP facilitated session by providing Mod A verbal cues for organization and problem solving during a basic appointment calendar making task for utilization of her daily schedule to anticipate/recall daily therapy sessions. Patient left upright in wheelchair with all needs within reach. Continue with current plan of care.      Function:   Cognition Comprehension Comprehension assist level: Understands basic 90% of the time/cues < 10% of the time  Expression Expression assistive device: Talk trach valve Expression assist level: Expresses complex 90% of the time/cues < 10% of the time  Social Interaction Social Interaction assist level: Interacts appropriately 75 - 89% of the time - Needs redirection for appropriate language or to initiate interaction.  Problem Solving Problem solving assist level: Solves basic 50 - 74% of the time/requires cueing 25 - 49% of the time  Memory Memory assist level: Recognizes or recalls 25 - 49% of the time/requires cueing 50 - 75% of the time    Pain No/Denies Pain   Therapy/Group: Individual Therapy  Ladarrion Telfair 06/29/2017, 2:56 PM

## 2017-06-29 NOTE — Progress Notes (Signed)
Patient seen for trach team follow up.  All needed equipment at the bedside.  No education needed at this time.  Will continue to follow for progression.

## 2017-06-29 NOTE — Progress Notes (Signed)
Subjective/Complaints: Accidentally pulled trach last night while asleep.  Nurse replaced trach and no other issues were reported.  Patient's oxygen saturation stayed in the 90s while and during the episode.  She slept well otherwise  ROS: pt denies nausea, vomiting, diarrhea, cough, shortness of breath or chest pain   Objective: Vital Signs: Blood pressure (!) 157/51, pulse 63, temperature 97.8 F (36.6 C), temperature source Oral, resp. rate 18, height 5' 1" (1.549 m), weight 73.2 kg (161 lb 6 oz), SpO2 93 %. Dg Chest 2 View  Result Date: 06/27/2017 CLINICAL DATA:  Cough for a few days.  History of tracheotomy. EXAM: CHEST  2 VIEW COMPARISON:  06/14/2017 FINDINGS: Small bilateral pleural effusion. No superimposed airspace opacity or edema. Chronic cardiomegaly. Calcified granuloma over the right lower lung. A tracheostomy tube remains well seated. Feeding tube and IJ catheter have been removed. 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 IMPRESSION: Small layering pleural effusions.  No noted pneumonia or edema. Electronically Signed   By: Monte Fantasia M.D.   On: 06/27/2017 11:12   Results for orders placed or performed during the hospital encounter of 06/23/17 (from the past 72 hour(s))  Glucose, capillary     Status: Abnormal   Collection Time: 06/26/17 12:07 PM  Result Value Ref Range   Glucose-Capillary 131 (H) 65 - 99 mg/dL  Glucose, capillary     Status: Abnormal   Collection Time: 06/26/17  4:15 PM  Result Value Ref Range   Glucose-Capillary 185 (H) 65 - 99 mg/dL  Glucose, capillary     Status: Abnormal   Collection Time: 06/26/17  9:00 PM  Result Value Ref Range   Glucose-Capillary 230 (H) 65 - 99 mg/dL  Glucose, capillary     Status: Abnormal   Collection Time: 06/27/17  6:44 AM  Result Value Ref Range   Glucose-Capillary 199 (H) 65 - 99 mg/dL  Glucose, capillary     Status: Abnormal   Collection Time: 06/27/17 11:48 AM  Result  Value Ref Range   Glucose-Capillary 192 (H) 65 - 99 mg/dL  Glucose, capillary     Status: Abnormal   Collection Time: 06/27/17  4:42 PM  Result Value Ref Range   Glucose-Capillary 128 (H) 65 - 99 mg/dL  Glucose, capillary     Status: Abnormal   Collection Time: 06/27/17  9:04 PM  Result Value Ref Range   Glucose-Capillary 192 (H) 65 - 99 mg/dL  Glucose, capillary     Status: Abnormal   Collection Time: 06/28/17  6:14 AM  Result Value Ref Range   Glucose-Capillary 175 (H) 65 - 99 mg/dL  Glucose, capillary     Status: Abnormal   Collection Time: 06/28/17 11:37 AM  Result Value Ref Range   Glucose-Capillary 188 (H) 65 - 99 mg/dL  Urinalysis, Complete w Microscopic     Status: Abnormal   Collection Time: 06/28/17  4:29 PM  Result Value Ref Range   Color, Urine YELLOW YELLOW   APPearance CLEAR CLEAR   Specific Gravity, Urine 1.017 1.005 - 1.030   pH 8.0 5.0 - 8.0   Glucose, UA NEGATIVE NEGATIVE mg/dL   Hgb urine dipstick NEGATIVE NEGATIVE   Bilirubin Urine NEGATIVE NEGATIVE   Ketones, ur NEGATIVE NEGATIVE mg/dL   Protein, ur 100 (A) NEGATIVE mg/dL   Nitrite NEGATIVE NEGATIVE   Leukocytes, UA NEGATIVE NEGATIVE   RBC / HPF 0-5 0 - 5 RBC/hpf   WBC, UA 6-30 0 - 5 WBC/hpf   Bacteria, UA RARE (  A) NONE SEEN   Squamous Epithelial / LPF 0-5 (A) NONE SEEN  Glucose, capillary     Status: Abnormal   Collection Time: 06/28/17  4:45 PM  Result Value Ref Range   Glucose-Capillary 152 (H) 65 - 99 mg/dL  Glucose, capillary     Status: Abnormal   Collection Time: 06/28/17  9:14 PM  Result Value Ref Range   Glucose-Capillary 176 (H) 65 - 99 mg/dL  Glucose, capillary     Status: Abnormal   Collection Time: 06/29/17  6:41 AM  Result Value Ref Range   Glucose-Capillary 178 (H) 65 - 99 mg/dL     HEENT: #4 Shiley trach in place with Passy-Muir valve.  No secretions Cardio: RRR without murmur. No JVD    Resp: Lungs are clear with minimal expiratory wheezes today.  Good air movement.  No  distress  GI: BS positive and NT, ND Extremity:   Trace lower extremity edema.  Skin warm, pulses intact Skin:   Intact and Other trach site as above Neuro: Alert/Oriented, Normal Sensory and Abnormal Motor 4/5 in BUE and BLE Musc/Skel:  Normal Psych: Minimal anxiety   Assessment/Plan: 1. Functional deficits secondary to Debilty after VDRF which require 3+ hours per day of interdisciplinary therapy in a comprehensive inpatient rehab setting. Physiatrist is providing close team supervision and 24 hour management of active medical problems listed below. Physiatrist and rehab team continue to assess barriers to discharge/monitor patient progress toward functional and medical goals. FIM: Function - Bathing Position: Wheelchair/chair at sink Body parts bathed by patient: Right arm, Left arm, Chest, Abdomen, Front perineal area, Right upper leg, Left upper leg, Buttocks Body parts bathed by helper: Right lower leg, Left lower leg, Back Assist Level: Touching or steadying assistance(Pt > 75%)  Function- Upper Body Dressing/Undressing What is the patient wearing?: Pull over shirt/dress Bra - Perfomed by helper: Thread/unthread right bra strap, Thread/unthread left bra strap, Hook/unhook bra (pull down sports bra) Pull over shirt/dress - Perfomed by patient: Thread/unthread right sleeve, Thread/unthread left sleeve, Pull shirt over trunk Pull over shirt/dress - Perfomed by helper: Put head through opening Assist Level: Touching or steadying assistance(Pt > 75%) Function - Lower Body Dressing/Undressing What is the patient wearing?: Pants Position: Other (comment)(standing from EOB) Underwear - Performed by patient: Thread/unthread right underwear leg, Thread/unthread left underwear leg Underwear - Performed by helper: Pull underwear up/down Pants- Performed by patient: Pull pants up/down, Thread/unthread right pants leg Pants- Performed by helper: Thread/unthread left pants leg Non-skid  slipper socks- Performed by helper: Don/doff right sock, Don/doff left sock Assist for footwear: Dependant Assist for lower body dressing: (mod A)  Function - Toileting Toileting steps completed by patient: Performs perineal hygiene, Adjust clothing prior to toileting Toileting steps completed by helper: Adjust clothing after toileting Toileting Assistive Devices: Grab bar or rail Assist level: Touching or steadying assistance (Pt.75%)  Function - Air cabin crew transfer assistive device: Bedside commode Assist level to bedside commode (at bedside): Touching or steadying assistance (Pt > 75%) Assist level from bedside commode (at bedside): Touching or steadying assistance (Pt > 75%)  Function - Chair/bed transfer Chair/bed transfer method: Stand pivot Chair/bed transfer assist level: Touching or steadying assistance (Pt > 75%) Chair/bed transfer assistive device: Armrests Chair/bed transfer details: Verbal cues for precautions/safety, Verbal cues for technique  Function - Locomotion: Wheelchair Will patient use wheelchair at discharge?: No Type: Manual Max wheelchair distance: 30 Assist Level: Dependent (Pt equals 0%) Assist Level: Touching or steadying assistance (Pt > 75%)  Wheel 150 feet activity did not occur: Safety/medical concerns Turns around,maneuvers to table,bed, and toilet,negotiates 3% grade,maneuvers on rugs and over doorsills: No Function - Locomotion: Ambulation Assistive device: Walker-rolling Max distance: 70 Assist level: Touching or steadying assistance (Pt > 75%) Assist level: Touching or steadying assistance (Pt > 75%) Walk 50 feet with 2 turns activity did not occur: Safety/medical concerns Assist level: Touching or steadying assistance (Pt > 75%) Walk 150 feet activity did not occur: Safety/medical concerns Walk 10 feet on uneven surfaces activity did not occur: Safety/medical concerns  Function - Comprehension Comprehension:  Auditory Comprehension assist level: Understands basic 90% of the time/cues < 10% of the time  Function - Expression Expression: Verbal Expression assistive device: Talk trach valve Expression assist level: Expresses complex 90% of the time/cues < 10% of the time  Function - Social Interaction Social Interaction assist level: Interacts appropriately 75 - 89% of the time - Needs redirection for appropriate language or to initiate interaction.  Function - Problem Solving Problem solving assist level: Solves basic 50 - 74% of the time/requires cueing 25 - 49% of the time  Function - Memory Memory assist level: Recognizes or recalls 25 - 49% of the time/requires cueing 50 - 75% of the time Patient normally able to recall (first 3 days only): Current season, Staff names and faces, That he or she is in a hospital  Medical Problem List and Plan: 1. 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 DVT Prophylaxis/Anticoagulation: Pharmaceutical: Xarelto 3. Pain Management: acetaminophen 4. Mood/anxiety:  Continued ego support fRom team for anxiety    -Stop Ativan and change Xanax to 0.25 every 8 hours as needed    -Continue low-dose Seroquel to assist with sleep 25 mg at 9 PM nightly     5. Neuropsych: This patient is capable of making decisions on her own behalf. 6. Skin/Wound Care: Prophyllactic foam dressing to sacrum 7. Fluids/Electrolytes/Nutrition: carb mod diet 8. 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 Change to po, no seizures since rehab admit 9. VDRF/respiratory: continue #4 cuff less Shiley trach: PLUG TODAY   -Continue scheduled and as needed nebulizers which appear to have helped    -Recent chest x-ray stable.  Exam appears to be improving daily     -continue incentive spirometer and flutter valve 10. 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      LOS (Days) 6 A FACE TO FACE EVALUATION WAS PERFORMED  Kallum Jorgensen T 06/29/2017, 9:33  AM

## 2017-06-30 ENCOUNTER — Inpatient Hospital Stay (HOSPITAL_COMMUNITY): Payer: Medicare Other | Admitting: Physical Therapy

## 2017-06-30 ENCOUNTER — Inpatient Hospital Stay (HOSPITAL_COMMUNITY): Payer: Medicare Other | Admitting: Speech Pathology

## 2017-06-30 DIAGNOSIS — N390 Urinary tract infection, site not specified: Secondary | ICD-10-CM

## 2017-06-30 DIAGNOSIS — A499 Bacterial infection, unspecified: Secondary | ICD-10-CM

## 2017-06-30 LAB — GLUCOSE, CAPILLARY
GLUCOSE-CAPILLARY: 145 mg/dL — AB (ref 65–99)
GLUCOSE-CAPILLARY: 137 mg/dL — AB (ref 65–99)
GLUCOSE-CAPILLARY: 135 mg/dL — AB (ref 65–99)
GLUCOSE-CAPILLARY: 157 mg/dL — AB (ref 65–99)
Glucose-Capillary: 166 mg/dL — ABNORMAL HIGH (ref 65–99)

## 2017-06-30 LAB — URINE CULTURE: Culture: 50000 — AB

## 2017-06-30 MED ORDER — CIPROFLOXACIN HCL 250 MG PO TABS
250.0000 mg | ORAL_TABLET | Freq: Two times a day (BID) | ORAL | Status: AC
  Administered 2017-06-30 – 2017-07-02 (×6): 250 mg via ORAL
  Filled 2017-06-30 (×6): qty 1

## 2017-06-30 NOTE — Progress Notes (Signed)
Speech Language Pathology Daily Session Note  Patient Details  Name: Amanda Short MRN: [id number] Date of Birth: 1940-12-10  Today's Date: 06/30/2017 SLP Individual Time: 0950-1030 SLP Individual Time Calculation (min): 40 min  Short Term Goals: Week 1: SLP Short Term Goal 1 (Week 1): Patient will donn and doff PMSV with Min assist verbal and visual cues  SLP Short Term Goal 2 (Week 1): Patient will utilize external aids to assist with recall of new information with Min assist question cues  SLP Short Term Goal 3 (Week 1): Patient will complete complex problem solving tasks with Min assist verbal, question cues to self-monitor and correct errors  Skilled Therapeutic Interventions:  Pt was seen for skilled ST targeting cognitive goals.  Pt's trach is now capped and pt is tolerating well with O2 sats WFL on 2L nasal cannula.  SLP facilitated the session with a novel card game to address problem solving.  Pt was able to plan and execute a problem solving strategy with supervision verbal cues for thoroughness and attention to details.  Pt was returned to room and left with husband at bedside.  Continue per current plan of care.    Function:  Eating Eating                 Cognition Comprehension Comprehension assist level: Follows basic conversation/direction with no assist  Expression   Expression assist level: Expresses basic needs/ideas: With no assist  Social Interaction Social Interaction assist level: Interacts appropriately with others with medication or extra time (anti-anxiety, antidepressant).  Problem Solving Problem solving assist level: Solves basic 90% of the time/requires cueing < 10% of the time  Memory Memory assist level: Recognizes or recalls 75 - 89% of the time/requires cueing 10 - 24% of the time    Pain Pain Assessment Pain Assessment: No/denies pain  Therapy/Group: Individual Therapy  Anola Mcgough, Selinda Orion 06/30/2017, 10:35 AM

## 2017-06-30 NOTE — Progress Notes (Signed)
Physical Therapy Session Note  Patient Details  Name: Amanda Short MRN: [id number] Date of Birth: [birth year]  Today's Date: 06/30/2017 PT Individual Time: 1345-1430 PT Individual Time Calculation (min): 45 min   Short Term Goals: Week 1:  PT Short Term Goal 1 (Week 1): Pt will ambulate 100 ft with RW and min assist PT Short Term Goal 2 (Week 1): Pt will perform bed mobility with supervision  PT Short Term Goal 3 (Week 1): Pt will tolerate 10 minutes of activity OOB without rest break  Skilled Therapeutic Interventions/Progress Updates: Pt received supine in bed, denies pain and agreeable to treatment. Supine>sit with HOB elevated and bedrails with supervision. Stand pivot transfer bed>w/c and w/c <>mat table with RW and min guard. Attempted w/c propulsion however ceased d/t discomfort in shoulders. Assessed Berg balance scale with results as below indicating high risk for falls; educated pt and husband on purpose of test to determine impairments and track progress over time. Standing balance on airex foam pad for focus on ankle strategy and righting reactions; performed static stance no UE support, and with dynamic BUE reaching to retrieve and match playing cards to board. Several posterior LOBs requiring assistance to recover or safely sit on mat table, however improved righting reactions and increased standing duration with repetition. O2 decreased to 1L/min for one bout of standing balance; O2 dropped to 87% and O2 returned to 2L/min. Returned to w/c as above. Encouraged pt to remain OOB for a short time as tolerable, as she reports she has been in bed most of the day; pt agreeable. Remained seated in w/c at end of session, husband present and all needs in reach.     Therapy Documentation Precautions:  Precautions Precautions: Fall Precaution Comments: trach + PMSV, monitor 02 Restrictions Weight Bearing Restrictions: No Vital Signs: Therapy Vitals Pulse Rate: 96 Resp: 16 Patient  Position (if appropriate): Lying Oxygen Therapy SpO2: 99 % O2 Device: Nasal Cannula O2 Flow Rate (L/min): 2 L/min Pain: Pain Assessment Pain Assessment: No/denies pain  Balance: Standardized Balance Assessment Standardized Balance Assessment: Berg Balance Test Berg Balance Test Sit to Stand: Able to stand  independently using hands Standing Unsupported: Able to stand 2 minutes with supervision Sitting with Back Unsupported but Feet Supported on Floor or Stool: Able to sit safely and securely 2 minutes Stand to Sit: Controls descent by using hands Transfers: Able to transfer with verbal cueing and /or supervision Standing Unsupported with Eyes Closed: Needs help to keep from falling Standing Ubsupported with Feet Together: Needs help to attain position but able to stand for 30 seconds with feet together From Standing, Reach Forward with Outstretched Arm: Reaches forward but needs supervision From Standing Position, Pick up Object from Floor: Unable to try/needs assist to keep balance From Standing Position, Turn to Look Behind Over each Shoulder: Needs supervision when turning Turn 360 Degrees: Needs assistance while turning Standing Unsupported, Alternately Place Feet on Step/Stool: Needs assistance to keep from falling or unable to try Standing Unsupported, One Foot in Front: Able to take small step independently and hold 30 seconds Standing on One Leg: Unable to try or needs assist to prevent fall Total Score: 20   See Function Navigator for Current Functional Status.   Therapy/Group: Individual Therapy  Luberta Mutter 06/30/2017, 2:31 PM

## 2017-06-30 NOTE — Progress Notes (Signed)
Subjective/Complaints: Had a very good evening, perhaps her best night sleep yet.  Tolerated the entire day and night with her trach plugged  ROS: pt denies nausea, vomiting, diarrhea, cough, shortness of breath or chest pain   Objective: Vital Signs: Blood pressure (!) 167/69, pulse 64, temperature 98 F (36.7 C), temperature source Oral, resp. rate 16, height _0  (1.549 m), weight 72 kg (158 lb 11.7 oz), SpO2 100 %. No results found. Results for orders placed or performed during the hospital encounter of 06/23/17 (from the past 72 hour(s))  Glucose, capillary     Status: Abnormal   Collection Time: 06/27/17 11:48 AM  Result Value Ref Range   Glucose-Capillary 192 (H) 65 - 99 mg/dL  Glucose, capillary     Status: Abnormal   Collection Time: 06/27/17  4:42 PM  Result Value Ref Range   Glucose-Capillary 128 (H) 65 - 99 mg/dL  Glucose, capillary     Status: Abnormal   Collection Time: 06/27/17  9:04 PM  Result Value Ref Range   Glucose-Capillary 192 (H) 65 - 99 mg/dL  Glucose, capillary     Status: Abnormal   Collection Time: 06/28/17  6:14 AM  Result Value Ref Range   Glucose-Capillary 175 (H) 65 - 99 mg/dL  Glucose, capillary     Status: Abnormal   Collection Time: 06/28/17 11:37 AM  Result Value Ref Range   Glucose-Capillary 188 (H) 65 - 99 mg/dL  Culture, Urine     Status: Abnormal   Collection Time: 06/28/17  4:26 PM  Result Value Ref Range   Specimen Description URINE, CLEAN CATCH    Special Requests NONE    Culture (A)     50,000 COLONIES/mL KLEBSIELLA PNEUMONIAE Confirmed Extended Spectrum Beta-Lactamase Producer (ESBL).  In bloodstream infections from ESBL organisms, carbapenems are preferred over piperacillin/tazobactam. They are shown to have a lower risk of mortality.    Report Status 06/30/2017 FINAL    Organism ID, Bacteria KLEBSIELLA PNEUMONIAE (A)       Susceptibility   Klebsiella pneumoniae - MIC*    AMPICILLIN >=32 RESISTANT Resistant     CEFAZOLIN  >=64 RESISTANT Resistant     CEFTRIAXONE >=64 RESISTANT Resistant     CIPROFLOXACIN <=0.25 SENSITIVE Sensitive     GENTAMICIN >=16 RESISTANT Resistant     IMIPENEM <=0.25 SENSITIVE Sensitive     NITROFURANTOIN 32 SENSITIVE Sensitive     TRIMETH/SULFA >=320 RESISTANT Resistant     AMPICILLIN/SULBACTAM 16 INTERMEDIATE Intermediate     PIP/TAZO <=4 SENSITIVE Sensitive     Extended ESBL POSITIVE Resistant     * 50,000 COLONIES/mL KLEBSIELLA PNEUMONIAE  Urinalysis, Complete w Microscopic     Status: Abnormal   Collection Time: 06/28/17  4:29 PM  Result Value Ref Range   Color, Urine YELLOW YELLOW   APPearance CLEAR CLEAR   Specific Gravity, Urine 1.017 1.005 - 1.030   pH 8.0 5.0 - 8.0   Glucose, UA NEGATIVE NEGATIVE mg/dL   Hgb urine dipstick NEGATIVE NEGATIVE   Bilirubin Urine NEGATIVE NEGATIVE   Ketones, ur NEGATIVE NEGATIVE mg/dL   Protein, ur 100 (A) NEGATIVE mg/dL   Nitrite NEGATIVE NEGATIVE   Leukocytes, UA NEGATIVE NEGATIVE   RBC / HPF 0-5 0 - 5 RBC/hpf   WBC, UA 6-30 0 - 5 WBC/hpf   Bacteria, UA RARE (A) NONE SEEN   Squamous Epithelial / LPF 0-5 (A) NONE SEEN  Glucose, capillary     Status: Abnormal   Collection Time: 06/28/17  4:45 PM  Result Value Ref Range   Glucose-Capillary 152 (H) 65 - 99 mg/dL  Glucose, capillary     Status: Abnormal   Collection Time: 06/28/17  9:14 PM  Result Value Ref Range   Glucose-Capillary 176 (H) 65 - 99 mg/dL  Glucose, capillary     Status: Abnormal   Collection Time: 06/29/17  6:41 AM  Result Value Ref Range   Glucose-Capillary 178 (H) 65 - 99 mg/dL  Glucose, capillary     Status: Abnormal   Collection Time: 06/29/17 11:36 AM  Result Value Ref Range   Glucose-Capillary 169 (H) 65 - 99 mg/dL  Glucose, capillary     Status: Abnormal   Collection Time: 06/29/17  4:56 PM  Result Value Ref Range   Glucose-Capillary 145 (H) 65 - 99 mg/dL  Glucose, capillary     Status: Abnormal   Collection Time: 06/30/17  6:34 AM  Result Value Ref  Range   Glucose-Capillary 166 (H) 65 - 99 mg/dL   Comment 1 Notify RN      HEENT: #4 Shiley trach in place and plugged.  Good phonation Cardio: RRR without murmur. No JVD    Resp: CTA Bilaterally without wheezes or rales. Normal effort   GI: BS positive and NT, ND Extremity:   Trace lower extremity edema.  Skin warm, pulses intact Skin:   Intact and Other trach site as above Neuro: Alert/Oriented, Normal Sensory and Abnormal Motor 4/5 in BUE and BLE Musc/Skel:  Normal Psych: Minimal anxiety   Assessment/Plan: 1. Functional deficits secondary to Debilty after VDRF which require 3+ hours per day of interdisciplinary therapy in a comprehensive inpatient rehab setting. Physiatrist is providing close team supervision and 24 hour management of active medical problems listed below. Physiatrist and rehab team continue to assess barriers to discharge/monitor patient progress toward functional and medical goals. FIM: Function - Bathing Position: Wheelchair/chair at sink Body parts bathed by patient: Right arm, Left arm, Chest, Abdomen, Front perineal area, Right upper leg, Left upper leg, Buttocks Body parts bathed by helper: Right lower leg, Left lower leg, Back Assist Level: Touching or steadying assistance(Pt > 75%)  Function- Upper Body Dressing/Undressing What is the patient wearing?: Pull over shirt/dress Bra - Perfomed by helper: Thread/unthread right bra strap, Thread/unthread left bra strap, Hook/unhook bra (pull down sports bra) Pull over shirt/dress - Perfomed by patient: Thread/unthread right sleeve, Thread/unthread left sleeve, Pull shirt over trunk Pull over shirt/dress - Perfomed by helper: Put head through opening Assist Level: Touching or steadying assistance(Pt > 75%) Function - Lower Body Dressing/Undressing What is the patient wearing?: Pants Position: Other (comment)(standing from EOB) Underwear - Performed by patient: Thread/unthread right underwear leg,  Thread/unthread left underwear leg Underwear - Performed by helper: Pull underwear up/down Pants- Performed by patient: Pull pants up/down, Thread/unthread right pants leg Pants- Performed by helper: Thread/unthread left pants leg Non-skid slipper socks- Performed by helper: Don/doff right sock, Don/doff left sock Assist for footwear: Dependant Assist for lower body dressing: (mod A)  Function - Toileting Toileting steps completed by patient: Performs perineal hygiene, Adjust clothing prior to toileting Toileting steps completed by helper: Adjust clothing after toileting Toileting Assistive Devices: Grab bar or rail Assist level: Touching or steadying assistance (Pt.75%)  Function - Air cabin crew transfer assistive device: Bedside commode Assist level to bedside commode (at bedside): Touching or steadying assistance (Pt > 75%) Assist level from bedside commode (at bedside): Touching or steadying assistance (Pt > 75%)  Function - Chair/bed transfer Chair/bed transfer method: Stand  pivot Chair/bed transfer assist level: Touching or steadying assistance (Pt > 75%) Chair/bed transfer assistive device: Armrests Chair/bed transfer details: Verbal cues for precautions/safety, Verbal cues for technique  Function - Locomotion: Wheelchair Will patient use wheelchair at discharge?: No Type: Manual Max wheelchair distance: 120 Assist Level: Dependent (Pt equals 0%) Assist Level: Dependent (Pt equals 0%) Wheel 150 feet activity did not occur: Safety/medical concerns Turns around,maneuvers to table,bed, and toilet,negotiates 3% grade,maneuvers on rugs and over doorsills: No Function - Locomotion: Ambulation Assistive device: Walker-rolling Max distance: 80 Assist level: Touching or steadying assistance (Pt > 75%) Assist level: Touching or steadying assistance (Pt > 75%) Walk 50 feet with 2 turns activity did not occur: Safety/medical concerns Assist level: Touching or steadying  assistance (Pt > 75%) Walk 150 feet activity did not occur: Safety/medical concerns Walk 10 feet on uneven surfaces activity did not occur: Safety/medical concerns  Function - Comprehension Comprehension: Auditory Comprehension assist level: Understands basic 90% of the time/cues < 10% of the time  Function - Expression Expression: Verbal Expression assistive device: Talk trach valve Expression assist level: Expresses complex 90% of the time/cues < 10% of the time  Function - Social Interaction Social Interaction assist level: Interacts appropriately 75 - 89% of the time - Needs redirection for appropriate language or to initiate interaction.  Function - Problem Solving Problem solving assist level: Solves basic 50 - 74% of the time/requires cueing 25 - 49% of the time  Function - Memory Memory assist level: Recognizes or recalls 25 - 49% of the time/requires cueing 50 - 75% of the time Patient normally able to recall (first 3 days only): Current season, Staff names and faces, That he or she is in a hospital  Medical Problem List and Plan: 1. 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 DVT Prophylaxis/Anticoagulation: Pharmaceutical: Xarelto 3. Pain Management: acetaminophen 4. Mood/anxiety:  Continued ego support fRom team for anxiety    -Stopped Ativan and changed Xanax to 0.25 every 8 hours as needed    -Continue low-dose Seroquel to assist with sleep 25 mg at 9 PM nightly     5. Neuropsych: This patient is capable of making decisions on her own behalf. 6. Skin/Wound Care: Prophyllactic foam dressing to sacrum 7. Fluids/Electrolytes/Nutrition: carb mod diet 8. 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 Change to po, no seizures since rehab admit 9. VDRF/respiratory: continue #4 cuff less Shiley trach: Plugs without any issues thus far   -Consider decannulation on Monday   -Continue scheduled and as needed nebulizers which appear to have  helped    -Recent chest x-ray stable.  Exam appears to be improving daily     -continue incentive spirometer and flutter valve 10. 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  11. 50k klebsiella in urine: will rx with cipro 285m bid x 3 days     LOS (Days) 7 A FACE TO FACE EVALUATION WAS PERFORMED  Coe Angelos T 06/30/2017, 9:39 AM

## 2017-07-01 ENCOUNTER — Inpatient Hospital Stay (HOSPITAL_COMMUNITY): Payer: Medicare Other | Admitting: Physical Therapy

## 2017-07-01 LAB — GLUCOSE, CAPILLARY
GLUCOSE-CAPILLARY: 163 mg/dL — AB (ref 65–99)
Glucose-Capillary: 165 mg/dL — ABNORMAL HIGH (ref 65–99)
Glucose-Capillary: 112 mg/dL — ABNORMAL HIGH (ref 65–99)
Glucose-Capillary: 172 mg/dL — ABNORMAL HIGH (ref 65–99)

## 2017-07-01 NOTE — Progress Notes (Signed)
Subjective/Complaints: Patient with another good night.  No issues with breathing or trach.  ROS: pt denies nausea, vomiting, diarrhea, cough, shortness of breath or chest pain   Objective: Vital Signs: Blood pressure (!) 151/64, pulse (!) 56, temperature 97.6 F (36.4 C), temperature source Oral, resp. rate 18, height _0  (1.549 m), weight 70.7 kg (155 lb 13.8 oz), SpO2 100 %. No results found. Results for orders placed or performed during the hospital encounter of 06/23/17 (from the past 72 hour(s))  Glucose, capillary     Status: Abnormal   Collection Time: 06/28/17 11:37 AM  Result Value Ref Range   Glucose-Capillary 188 (H) 65 - 99 mg/dL  Culture, Urine     Status: Abnormal   Collection Time: 06/28/17  4:26 PM  Result Value Ref Range   Specimen Description URINE, CLEAN CATCH    Special Requests NONE    Culture (A)     50,000 COLONIES/mL KLEBSIELLA PNEUMONIAE Confirmed Extended Spectrum Beta-Lactamase Producer (ESBL).  In bloodstream infections from ESBL organisms, carbapenems are preferred over piperacillin/tazobactam. They are shown to have a lower risk of mortality.    Report Status 06/30/2017 FINAL    Organism ID, Bacteria KLEBSIELLA PNEUMONIAE (A)       Susceptibility   Klebsiella pneumoniae - MIC*    AMPICILLIN >=32 RESISTANT Resistant     CEFAZOLIN >=64 RESISTANT Resistant     CEFTRIAXONE >=64 RESISTANT Resistant     CIPROFLOXACIN <=0.25 SENSITIVE Sensitive     GENTAMICIN >=16 RESISTANT Resistant     IMIPENEM <=0.25 SENSITIVE Sensitive     NITROFURANTOIN 32 SENSITIVE Sensitive     TRIMETH/SULFA >=320 RESISTANT Resistant     AMPICILLIN/SULBACTAM 16 INTERMEDIATE Intermediate     PIP/TAZO <=4 SENSITIVE Sensitive     Extended ESBL POSITIVE Resistant     * 50,000 COLONIES/mL KLEBSIELLA PNEUMONIAE  Urinalysis, Complete w Microscopic     Status: Abnormal   Collection Time: 06/28/17  4:29 PM  Result Value Ref Range   Color, Urine YELLOW YELLOW   APPearance CLEAR  CLEAR   Specific Gravity, Urine 1.017 1.005 - 1.030   pH 8.0 5.0 - 8.0   Glucose, UA NEGATIVE NEGATIVE mg/dL   Hgb urine dipstick NEGATIVE NEGATIVE   Bilirubin Urine NEGATIVE NEGATIVE   Ketones, ur NEGATIVE NEGATIVE mg/dL   Protein, ur 100 (A) NEGATIVE mg/dL   Nitrite NEGATIVE NEGATIVE   Leukocytes, UA NEGATIVE NEGATIVE   RBC / HPF 0-5 0 - 5 RBC/hpf   WBC, UA 6-30 0 - 5 WBC/hpf   Bacteria, UA RARE (A) NONE SEEN   Squamous Epithelial / LPF 0-5 (A) NONE SEEN  Glucose, capillary     Status: Abnormal   Collection Time: 06/28/17  4:45 PM  Result Value Ref Range   Glucose-Capillary 152 (H) 65 - 99 mg/dL  Glucose, capillary     Status: Abnormal   Collection Time: 06/28/17  9:14 PM  Result Value Ref Range   Glucose-Capillary 176 (H) 65 - 99 mg/dL  Glucose, capillary     Status: Abnormal   Collection Time: 06/29/17  6:41 AM  Result Value Ref Range   Glucose-Capillary 178 (H) 65 - 99 mg/dL  Glucose, capillary     Status: Abnormal   Collection Time: 06/29/17 11:36 AM  Result Value Ref Range   Glucose-Capillary 169 (H) 65 - 99 mg/dL  Glucose, capillary     Status: Abnormal   Collection Time: 06/29/17  4:56 PM  Result Value Ref Range   Glucose-Capillary 145 (  H) 65 - 99 mg/dL  Glucose, capillary     Status: Abnormal   Collection Time: 06/30/17  6:34 AM  Result Value Ref Range   Glucose-Capillary 166 (H) 65 - 99 mg/dL   Comment 1 Notify RN   Glucose, capillary     Status: Abnormal   Collection Time: 06/30/17 11:32 AM  Result Value Ref Range   Glucose-Capillary 137 (H) 65 - 99 mg/dL  Glucose, capillary     Status: Abnormal   Collection Time: 06/30/17  4:11 PM  Result Value Ref Range   Glucose-Capillary 135 (H) 65 - 99 mg/dL  Glucose, capillary     Status: Abnormal   Collection Time: 06/30/17  9:09 PM  Result Value Ref Range   Glucose-Capillary 157 (H) 65 - 99 mg/dL   Comment 1 Notify RN   Glucose, capillary     Status: Abnormal   Collection Time: 07/01/17  6:34 AM  Result Value  Ref Range   Glucose-Capillary 163 (H) 65 - 99 mg/dL   Comment 1 Notify RN      HEENT: #4 Shiley trach in place and plugged.  Good phonation Cardio: RRR without murmur. No JVD     Resp: CTA Bilaterally without wheezes or rales. Normal effort   GI: BS positive and NT, ND Extremity:   Trace lower extremity edema.  Skin warm, pulses intact Skin:   Intact and Other trach site as above Neuro: Alert/Oriented, Normal Sensory and Abnormal Motor 4/5 in BUE and BLE Musc/Skel:  Normal Psych: Calm and pleasant   Assessment/Plan: 1. Functional deficits secondary to Debilty after VDRF which require 3+ hours per day of interdisciplinary therapy in a comprehensive inpatient rehab setting. Physiatrist is providing close team supervision and 24 hour management of active medical problems listed below. Physiatrist and rehab team continue to assess barriers to discharge/monitor patient progress toward functional and medical goals. FIM: Function - Bathing Position: Wheelchair/chair at sink Body parts bathed by patient: Right arm, Left arm, Chest, Abdomen, Front perineal area, Right upper leg, Left upper leg, Buttocks Body parts bathed by helper: Right lower leg, Left lower leg, Back Assist Level: Touching or steadying assistance(Pt > 75%)  Function- Upper Body Dressing/Undressing What is the patient wearing?: Pull over shirt/dress Bra - Perfomed by helper: Thread/unthread right bra strap, Thread/unthread left bra strap, Hook/unhook bra (pull down sports bra) Pull over shirt/dress - Perfomed by patient: Thread/unthread right sleeve, Thread/unthread left sleeve, Pull shirt over trunk Pull over shirt/dress - Perfomed by helper: Put head through opening Assist Level: Touching or steadying assistance(Pt > 75%) Function - Lower Body Dressing/Undressing What is the patient wearing?: Pants Position: Other (comment)(standing from EOB) Underwear - Performed by patient: Thread/unthread right underwear leg,  Thread/unthread left underwear leg Underwear - Performed by helper: Pull underwear up/down Pants- Performed by patient: Pull pants up/down, Thread/unthread right pants leg Pants- Performed by helper: Thread/unthread left pants leg Non-skid slipper socks- Performed by helper: Don/doff right sock, Don/doff left sock Assist for footwear: Dependant Assist for lower body dressing: (mod A)  Function - Toileting Toileting steps completed by patient: Adjust clothing prior to toileting, Performs perineal hygiene Toileting steps completed by helper: Adjust clothing after toileting Toileting Assistive Devices: Grab bar or rail Assist level: Touching or steadying assistance (Pt.75%)  Function - Air cabin crew transfer assistive device: Bedside commode Assist level to bedside commode (at bedside): Touching or steadying assistance (Pt > 75%) Assist level from bedside commode (at bedside): Touching or steadying assistance (Pt > 75%)  Function - Chair/bed transfer Chair/bed transfer method: Stand pivot Chair/bed transfer assist level: Touching or steadying assistance (Pt > 75%) Chair/bed transfer assistive device: Armrests, Walker Chair/bed transfer details: Verbal cues for precautions/safety, Verbal cues for technique  Function - Locomotion: Wheelchair Will patient use wheelchair at discharge?: No Type: Manual Max wheelchair distance: 120 Assist Level: Dependent (Pt equals 0%) Assist Level: Dependent (Pt equals 0%) Wheel 150 feet activity did not occur: Safety/medical concerns Turns around,maneuvers to table,bed, and toilet,negotiates 3% grade,maneuvers on rugs and over doorsills: No Function - Locomotion: Ambulation Assistive device: Walker-rolling Max distance: 75 Assist level: Touching or steadying assistance (Pt > 75%) Assist level: Touching or steadying assistance (Pt > 75%) Walk 50 feet with 2 turns activity did not occur: Safety/medical concerns Assist level: Touching or  steadying assistance (Pt > 75%) Walk 150 feet activity did not occur: Safety/medical concerns Walk 10 feet on uneven surfaces activity did not occur: Safety/medical concerns  Function - Comprehension Comprehension: Auditory Comprehension assist level: Follows basic conversation/direction with no assist  Function - Expression Expression: Verbal Expression assistive device: Talk trach valve Expression assist level: Expresses basic needs/ideas: With no assist  Function - Social Interaction Social Interaction assist level: Interacts appropriately with others with medication or extra time (anti-anxiety, antidepressant).  Function - Problem Solving Problem solving assist level: Solves basic 90% of the time/requires cueing < 10% of the time  Function - Memory Memory assist level: Recognizes or recalls 75 - 89% of the time/requires cueing 10 - 24% of the time Patient normally able to recall (first 3 days only): Current season, Staff names and faces, That he or she is in a hospital  Medical Problem List and Plan: 1. 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 DVT Prophylaxis/Anticoagulation: Pharmaceutical: Xarelto 3. Pain Management: acetaminophen 4. Mood/anxiety:  Continued ego support fRom team for anxiety    -Stopped Ativan and changed Xanax to 0.25 every 8 hours as needed    -Continue low-dose Seroquel to assist with sleep 25 mg at 9 PM nightly     5. Neuropsych: This patient is capable of making decisions on her own behalf. 6. Skin/Wound Care: Prophyllactic foam dressing to sacrum 7. Fluids/Electrolytes/Nutrition: carb mod diet 8. 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 Change to po, no seizures since rehab admit 9. VDRF/respiratory: continue #4 cuff less Shiley trach: Plugs without any issues thus far   -Consider decannulation on Monday   -Continue scheduled and as needed nebulizers which appear to have helped    -Recent chest x-ray stable.   Exam appears to be improving daily     -continue incentive spirometer and flutter valve as she is doing 10. 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  11. 50k klebsiella in urine:  cipro 268m bid x 3 days     LOS (Days) 8 A FACE TO FACE EVALUATION WAS PERFORMED  SWARTZ,ZACHARY T 07/01/2017, 9:36 AM

## 2017-07-01 NOTE — Progress Notes (Addendum)
Physical Therapy Session Note  Patient Details  Name: Amanda Short MRN: [id number] Date of Birth: 05-23-1941  Today's Date: 07/01/2017 PT Individual Time: 0700-0800 PT Individual Time Calculation (min): 60 min   Short Term Goals: Week 1:  PT Short Term Goal 1 (Week 1): Pt will ambulate 100 ft with RW and min assist PT Short Term Goal 2 (Week 1): Pt will perform bed mobility with supervision  PT Short Term Goal 3 (Week 1): Pt will tolerate 10 minutes of activity OOB without rest break  Skilled Therapeutic Interventions/Progress Updates: Pt received seated in bed, denies pain and agreeable to treatment. SetupA for breakfast to open containers d/t decreased fine motor control and grip/pinch strength. While pt eating breakfast, discussed follow up therapy options after d/c from rehab. Pt reports she had setup outpatient therapy previously but was unable to attend first visit d/t rehospitalization. Supine>sit with S, HOB elevated. Ambulatory transfer to w/c at sink with RW and close S. Performed oral hygiene, bathing and dressing at sink with setupA and min cues for managing O2 nasal cannula while changing shirts. Requires increased time for all activities d/t fatigue, decreased UE strength and coordination. Ambulation in/out of bathroom with RW and minA; one posterior LOB while ascending small inclined ledge into bathroom d/t limited ankle ROM/strength and impaired righting reactions. Pt managed clothing and performed hygiene with S and increased time. Remained seated in w/c at end of session, all needs in reach.       Therapy Documentation Precautions:  Precautions Precautions: Fall Precaution Comments: trach (capped), monitor 02 Restrictions Weight Bearing Restrictions: No Vital Signs: Therapy Vitals Temp: 97.6 F (36.4 C) Temp Source: Oral Pulse Rate: 60 Resp: 16 BP: (!) 151/64 Patient Position (if appropriate): Lying Oxygen Therapy SpO2: 100 % O2 Device: Nasal Cannula O2 Flow  Rate (L/min): 2 L/min   See Function Navigator for Current Functional Status.   Therapy/Group: Individual Therapy  Luberta Mutter 07/01/2017, 6:58 AM

## 2017-07-02 ENCOUNTER — Inpatient Hospital Stay (HOSPITAL_COMMUNITY): Payer: Medicare Other | Admitting: Speech Pathology

## 2017-07-02 ENCOUNTER — Inpatient Hospital Stay (HOSPITAL_COMMUNITY): Payer: Medicare Other | Admitting: Occupational Therapy

## 2017-07-02 ENCOUNTER — Inpatient Hospital Stay (HOSPITAL_COMMUNITY): Payer: Medicare Other

## 2017-07-02 LAB — GLUCOSE, CAPILLARY
GLUCOSE-CAPILLARY: 167 mg/dL — AB (ref 65–99)
GLUCOSE-CAPILLARY: 173 mg/dL — AB (ref 65–99)
Glucose-Capillary: 166 mg/dL — ABNORMAL HIGH (ref 65–99)
Glucose-Capillary: 156 mg/dL — ABNORMAL HIGH (ref 65–99)
Glucose-Capillary: 111 mg/dL — ABNORMAL HIGH (ref 65–99)

## 2017-07-02 MED ORDER — METFORMIN HCL 500 MG PO TABS
250.0000 mg | ORAL_TABLET | Freq: Two times a day (BID) | ORAL | Status: DC
  Administered 2017-07-02 – 2017-07-04 (×5): 250 mg via ORAL
  Filled 2017-07-02 (×5): qty 1

## 2017-07-02 MED ORDER — FLUCONAZOLE 100 MG PO TABS
100.0000 mg | ORAL_TABLET | Freq: Every day | ORAL | Status: AC
  Administered 2017-07-02 – 2017-07-03 (×2): 100 mg via ORAL
  Filled 2017-07-02 (×2): qty 1

## 2017-07-02 NOTE — Progress Notes (Addendum)
Subjective/Complaints: I had an excellent night.  No breathing problems.  In good spirits today  ROS: pt denies nausea, vomiting, diarrhea, cough, shortness of breath or chest pain   Objective: Vital Signs: Blood pressure (!) 149/60, pulse 62, temperature 97.9 F (36.6 C), temperature source Oral, resp. rate 16, height _0  (1.549 m), weight 69.5 kg (153 lb 3.5 oz), SpO2 98 %. No results found. Results for orders placed or performed during the hospital encounter of 06/23/17 (from the past 72 hour(s))  Glucose, capillary     Status: Abnormal   Collection Time: 06/29/17 11:36 AM  Result Value Ref Range   Glucose-Capillary 169 (H) 65 - 99 mg/dL  Glucose, capillary     Status: Abnormal   Collection Time: 06/29/17  4:56 PM  Result Value Ref Range   Glucose-Capillary 145 (H) 65 - 99 mg/dL  Glucose, capillary     Status: Abnormal   Collection Time: 06/30/17  6:34 AM  Result Value Ref Range   Glucose-Capillary 166 (H) 65 - 99 mg/dL   Comment 1 Notify RN   Glucose, capillary     Status: Abnormal   Collection Time: 06/30/17 11:32 AM  Result Value Ref Range   Glucose-Capillary 137 (H) 65 - 99 mg/dL  Glucose, capillary     Status: Abnormal   Collection Time: 06/30/17  4:11 PM  Result Value Ref Range   Glucose-Capillary 135 (H) 65 - 99 mg/dL  Glucose, capillary     Status: Abnormal   Collection Time: 06/30/17  9:09 PM  Result Value Ref Range   Glucose-Capillary 157 (H) 65 - 99 mg/dL   Comment 1 Notify RN   Glucose, capillary     Status: Abnormal   Collection Time: 07/01/17  6:34 AM  Result Value Ref Range   Glucose-Capillary 163 (H) 65 - 99 mg/dL   Comment 1 Notify RN   Glucose, capillary     Status: Abnormal   Collection Time: 07/01/17 11:31 AM  Result Value Ref Range   Glucose-Capillary 165 (H) 65 - 99 mg/dL  Glucose, capillary     Status: Abnormal   Collection Time: 07/01/17  4:48 PM  Result Value Ref Range   Glucose-Capillary 112 (H) 65 - 99 mg/dL  Glucose, capillary      Status: Abnormal   Collection Time: 07/01/17  9:41 PM  Result Value Ref Range   Glucose-Capillary 172 (H) 65 - 99 mg/dL   Comment 1 Notify RN   Glucose, capillary     Status: Abnormal   Collection Time: 07/02/17  6:48 AM  Result Value Ref Range   Glucose-Capillary 166 (H) 65 - 99 mg/dL   Comment 1 Notify RN      HEENT: #4 [place] trach in place and plugged.  Improved phonation Cardio: RRR without murmur. No JVD      Resp: Lungs clear to auscultation bilaterally with excellent air movement.  No wheezes GI: BS positive and NT, ND Extremity:   Trace lower extremity edema.  Skin warm, pulses intact Skin:   Intact and Other trach site as above Neuro: Alert/Oriented, Normal Sensory and Abnormal Motor 4/5 in BUE and BLE Musc/Skel:  Normal Psych: Calm and pleasant   Assessment/Plan: 1. Functional deficits secondary to Debilty after VDRF which require 3+ hours per day of interdisciplinary therapy in a comprehensive inpatient rehab setting. Physiatrist is providing close team supervision and 24 hour management of active medical problems listed below. Physiatrist and rehab team continue to assess barriers to discharge/monitor patient progress  toward functional and medical goals. FIM: Function - Bathing Position: Wheelchair/chair at sink Body parts bathed by patient: Right arm, Left arm, Chest, Abdomen, Front perineal area, Right upper leg, Left upper leg, Buttocks Body parts bathed by helper: Right lower leg, Left lower leg, Back Assist Level: Touching or steadying assistance(Pt > 75%)  Function- Upper Body Dressing/Undressing What is the patient wearing?: Pull over shirt/dress Bra - Perfomed by helper: Thread/unthread right bra strap, Thread/unthread left bra strap, Hook/unhook bra (pull down sports bra) Pull over shirt/dress - Perfomed by patient: Thread/unthread right sleeve, Pull shirt over trunk, Put head through opening, Thread/unthread left sleeve Pull over shirt/dress - Perfomed by  helper: Put head through opening Assist Level: Set up Set up : To obtain clothing/put away Function - Lower Body Dressing/Undressing What is the patient wearing?: Pants, Underwear Position: Wheelchair/chair at sink Underwear - Performed by patient: Thread/unthread right underwear leg, Thread/unthread left underwear leg, Pull underwear up/down Underwear - Performed by helper: Pull underwear up/down Pants- Performed by patient: Thread/unthread right pants leg, Thread/unthread left pants leg, Pull pants up/down Pants- Performed by helper: Thread/unthread left pants leg Non-skid slipper socks- Performed by helper: Don/doff right sock, Don/doff left sock Assist for footwear: Dependant Assist for lower body dressing: Supervision or verbal cues  Function - Toileting Toileting steps completed by patient: Adjust clothing prior to toileting, Performs perineal hygiene, Adjust clothing after toileting Toileting steps completed by helper: Adjust clothing after toileting Toileting Assistive Devices: Grab bar or rail Assist level: Touching or steadying assistance (Pt.75%)  Function - Air cabin crew transfer assistive device: Walker, Grab bar Assist level to toilet: Touching or steadying assistance (Pt > 75%) Assist level from toilet: Touching or steadying assistance (Pt > 75%) Assist level to bedside commode (at bedside): Touching or steadying assistance (Pt > 75%) Assist level from bedside commode (at bedside): Touching or steadying assistance (Pt > 75%)  Function - Chair/bed transfer Chair/bed transfer method: Stand pivot Chair/bed transfer assist level: Supervision or verbal cues Chair/bed transfer assistive device: Armrests, Walker Chair/bed transfer details: Verbal cues for precautions/safety, Verbal cues for technique  Function - Locomotion: Wheelchair Will patient use wheelchair at discharge?: No Type: Manual Max wheelchair distance: 120 Assist Level: Dependent (Pt equals  0%) Assist Level: Dependent (Pt equals 0%) Wheel 150 feet activity did not occur: Safety/medical concerns Turns around,maneuvers to table,bed, and toilet,negotiates 3% grade,maneuvers on rugs and over doorsills: No Function - Locomotion: Ambulation Assistive device: Walker-rolling Max distance: 75 Assist level: Touching or steadying assistance (Pt > 75%) Assist level: Touching or steadying assistance (Pt > 75%) Walk 50 feet with 2 turns activity did not occur: Safety/medical concerns Assist level: Touching or steadying assistance (Pt > 75%) Walk 150 feet activity did not occur: Safety/medical concerns Walk 10 feet on uneven surfaces activity did not occur: Safety/medical concerns  Function - Comprehension Comprehension: Auditory Comprehension assist level: Follows basic conversation/direction with no assist  Function - Expression Expression: Verbal Expression assistive device: Talk trach valve Expression assist level: Expresses basic needs/ideas: With no assist  Function - Social Interaction Social Interaction assist level: Interacts appropriately with others with medication or extra time (anti-anxiety, antidepressant).  Function - Problem Solving Problem solving assist level: Solves basic 75 - 89% of the time/requires cueing 10 - 24% of the time  Function - Memory Memory assist level: Recognizes or recalls 50 - 74% of the time/requires cueing 25 - 49% of the time Patient normally able to recall (first 3 days only): Current season, Staff names and  faces, That he or she is in a hospital  Medical Problem List and Plan: 1. Debility secondary to VDRF   -Continue inpatient rehab PT, OT, speech therapy.  Progressing towards goals 2. DVT Prophylaxis/Anticoagulation: Pharmaceutical: Xarelto 3. Pain Management: acetaminophen 4. Mood/anxiety:  Continued ego support fRom team for anxiety    -  Xanax   0.25 every 8 hours as needed    -Continue low-dose Seroquel to assist with sleep 25  mg at 9 PM nightly     5. Neuropsych: This patient is capable of making decisions on her own behalf. 6. Skin/Wound Care: Prophyllactic foam dressing to sacrum 7. Fluids/Electrolytes/Nutrition: carb mod diet 8. 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 Change to po, no seizures since rehab admit 9. VDRF/respiratory: Decannulate today as patient did extremely well with her trach plugged all weekend .  Applied compressive dressing  10. 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  11. 50k klebsiella in urine:  cipro 262m bid x 3 days   -Having some associated yeast as well.  Give Diflucan x2 doses 12. DM2: Resume metformin 250 mg twice daily.  Home dose is 500 mg twice daily   LOS (Days) 9 A FACE TO FACE EVALUATION WAS PERFORMED  Amanda Short T 07/02/2017, 8:56 AM

## 2017-07-02 NOTE — Progress Notes (Signed)
Physical Therapy Weekly Progress Note  Patient Details  Name: Amanda Short MRN: [id number] Date of Birth: 18-Jul-1940  Beginning of progress report period: June 24, 2017 End of progress report period: July 02, 2017  Today's Date: 07/02/2017 PT Individual Time: 1350-1505 PT Individual Time Calculation (min): 75 min   Patient has met 2 of 3 short term goals.  Patient is progressing with mobility and endurance with gait.  She has at times met all goals, but is inconsistent with bed mobility, at times needing assist for side to sit despite cues and use of railings.  She has just been decannulated today and was able to ambulate >80' for the first time today.  She will benefit from continued skilled PT as noted below.  Patient continues to demonstrate the following deficits muscle weakness and decreased cardiorespiratoy endurance and therefore will continue to benefit from skilled PT intervention to increase functional independence with mobility.  Patient progressing toward long term goals..  Continue plan of care.  PT Short Term Goals Week 1:  PT Short Term Goal 1 (Week 1): Pt will ambulate 100 ft with RW and min assist PT Short Term Goal 1 - Progress (Week 1): Met PT Short Term Goal 2 (Week 1): Pt will perform bed mobility with supervision  PT Short Term Goal 2 - Progress (Week 1): Progressing toward goal PT Short Term Goal 3 (Week 1): Pt will tolerate 10 minutes of activity OOB without rest break PT Short Term Goal 3 - Progress (Week 1): Met Week 2:  PT Short Term Goal 1 (Week 2): Patient will perform sit to stand transfers with S PT Short Term Goal 2 (Week 2): Patient will perform bed mobility with S. PT Short Term Goal 3 (Week 2): Patient will ambulate 150' with minguard A PT Short Term Goal 4 (Week 2): Patient will perform car transfer min A  Skilled Therapeutic Interventions/Progress Updates:    Patient in w/c in room.  Performed sit to stand min A and cues for hand  placement.  She ambulated to therapy gym with spouse following with w/c on 2L O2 via [place] (noted trach decannulated today)..  Min A with RW and two LOB to R.  Able to recover in armchair in gym and performed vestibular screed due to noting R lateral head lean and R LOB at times with ambulation.  Noted pt with some overshoots with saccades and some dysconjugate gaze in some visual fields with smooth pursuits reporting some transient diplopia.  She relates having hit her head with concussion due to a fall in 2014.  Some difficulty with gaze stability during VOR testing with 1 near target x 30 sec horizontal and vertical.  She demonstrates intact VOR cancellation, but same trouble with gaze stability.  Patient with positive head thrust to R.  No change in hearing R to L, but normally wears hearing aides but they are at home.  Noted after stretch to R side of neck improved midline positioning of head.  Standing balance activities consisting of alternate taps to box with one UE supported, standing with feet together, partial tandem and feet apart without UE support x 30 sec each with min A.  In parallel bars, side stepping, crossing over, heel and toe raises and toe walking.  Patient ambulated with SPC min to mod A with multiple LOB and min to mod A to recover.  Gait to room min A with RW and spouse following with chair.  Toileting min A for balance.  Left  in w/c with spouse present and all needs in reach.   Therapy Documentation Precautions:  Precautions Precautions: Fall Precaution Comments: trach (capped), monitor 02 Restrictions Weight Bearing Restrictions: No Pain: Pain Assessment Pain Assessment: No/denies pain   See Function Navigator for Current Functional Status.  Therapy/Group: Individual Therapy  Reginia Naas 07/02/2017, 4:26 PM

## 2017-07-02 NOTE — Plan of Care (Signed)
Stair and car transfer goals added

## 2017-07-02 NOTE — Progress Notes (Signed)
Pt decannulated per MD order. Placed on 2 lpm [place]. Pt without difficulty. Pt vocalizing. HR 89, Spo2 at 99%. Gauze placed on stoma.

## 2017-07-02 NOTE — Progress Notes (Addendum)
Occupational Therapy Weekly Progress Note  Patient Details  Name: Amanda Short MRN: [id number] Date of Birth: 1940-12-11   Today's Date: 07/02/2017 OT Individual Time: 4967-5916 OT Individual Time Calculation (min): 73 min  Beginning of progress report period: 06/24/17 End of progress report period: 07/02/17  Patient has met 3 of 3 short term goals.   Patient has made excellent progress at time of report. She has improved BADL levels, going from requiring overall Mod-Max A to now requiring Min A-supervision. Pt now completes functional toilet transfers at ambulatory level with RW and Min A, improving from time of eval where she completed these transfers with Mod A via stand pivot. Her 02 saturation during functional tasks has become more stabilized, with pt able to increase sats from high 80s into 90s with about a minute after standing without supplemental 02. Family education has been ongoing and will continue for safe transition home this week. Pt progressing towards LTG achievement.   Patient continues to demonstrate the following deficits: muscle weakness, decreased cardiorespiratoy endurance, decreased coordination, decreased problem solving, decreased safety awareness, decreased memory and delayed processing and decreased standing balance and decreased postural control and therefore will continue to benefit from skilled OT intervention to enhance overall performance with BADL and iADL.  Patient progressing toward long term goals..  Continue plan of care.  OT Short Term Goals Week 1:  OT Short Term Goal 1 (Week 1): Pt will complete LB dressing with Mod A OT Short Term Goal 1 - Progress (Week 1): Met OT Short Term Goal 2 (Week 1): Pt will complete sit<stand during bathing with Min A OT Short Term Goal 2 - Progress (Week 1): Met OT Short Term Goal 3 (Week 1): Pt will complete BSC/toilet transfer with Min A and 02 sats >90% OT Short Term Goal 3 - Progress (Week 1): Met Week 2:  OT  Short Term Goal 1 (Week 2): STGs=LTGs due to ELOS  Skilled Therapeutic Interventions/Progress Updates:  Tx focus on self care mgt retraining, balance, activity tolerance, and standing endurance.   Pt greeted in w/c, agreeable to complete bathing/dressing w/c level at sink. Pt able to safely reach forward to wash feet. Sit<stand with Min A for lifting pants over hips. She was also able to don gripper socks herself. Min A for tight overhead shirt. Afterwards she completed oral care/grooming tasks while standing at sink with min guard. 02 sats monitored throughout tx while she was on RA. It decreased to 87% once after standing, with pt able to increase it to 98% with instruction on deep breathing and 1 minute time window. Had her stand while engaging in bedmaking tasks with her husband. Pt sidestepping in front of bed with Min A, removing bilateral UEs to apply sheets/comforters. Pt sitting and donning pillowcases for energy conservation. Continued education regarding having pt participate in meaningful IADLs at home to maintain current functional level.  02 sats at session exit 98% on RA. RN notified and agreed to keep supplemental 02 removed at rest. Pt left sitting in w/c with spouse present.   Max cues for proper hand placement during sit<stand transitions.   Therapy Documentation Precautions:  Precautions Precautions: Fall Precaution Comments: trach (capped), monitor 02 Restrictions Weight Bearing Restrictions: No Vital Signs: Oxygen Therapy SpO2: 99 % O2 Device: Nasal Cannula O2 Flow Rate (L/min): 2 L/min FiO2 (%): 28 % Pain: No c/o pain during tx    ADL: ADL ADL Comments: Please see functional navigator for ADL status     See  Function Navigator for Current Functional Status.   Therapy/Group: Individual Therapy  Doyle Tegethoff A Ruth Tully 07/02/2017, 12:54 PM

## 2017-07-02 NOTE — Progress Notes (Signed)
Speech Language Pathology Daily Session Note  Patient Details  Name: Amanda Short MRN: [id number] Date of Birth: 01-26-1941  Today's Date: 07/02/2017 SLP Individual Time: 1100-1200 SLP Individual Time Calculation (min): 60 min  Short Term Goals: Week 1: SLP Short Term Goal 1 (Week 1): Patient will donn and doff PMSV with Min assist verbal and visual cues  SLP Short Term Goal 2 (Week 1): Patient will utilize external aids to assist with recall of new information with Min assist question cues  SLP Short Term Goal 3 (Week 1): Patient will complete complex problem solving tasks with Min assist verbal, question cues to self-monitor and correct errors  Skilled Therapeutic Interventions: Skilled treatment session focused on cognitive goals. Upon arrival, patient had been decannulated and was independently applying pressure to her stoma to maximize speech intelligibility to 100% at the sentence level. SLP facilitated session by providing supervision verbal cues for problem solving and organization during a mildly complex money management task. However, patient required Mod A verbal cues for problem solving and organization during a mildly complex scheduling task and Min A for verbal cues for problem solving and organization while utilizing the menu to record daily meal selections. Patient with appropriate awareness in regards to difficultly of task. Patient left upright in wheelchair with husband present. Continue with current plan of care.      Function:  Cognition Comprehension Comprehension assist level: Follows basic conversation/direction with no assist  Expression Expression assistive device: Talk trach valve Expression assist level: Expresses basic needs/ideas: With no assist  Social Interaction Social Interaction assist level: Interacts appropriately with others with medication or extra time (anti-anxiety, antidepressant).  Problem Solving Problem solving assist level: Solves basic 75 -  89% of the time/requires cueing 10 - 24% of the time  Memory Memory assist level: Recognizes or recalls 50 - 74% of the time/requires cueing 25 - 49% of the time    Pain No/Denies Pain   Therapy/Group: Individual Therapy  Trayshawn Durkin 07/02/2017, 3:37 PM

## 2017-07-03 ENCOUNTER — Inpatient Hospital Stay (HOSPITAL_COMMUNITY): Payer: Medicare Other

## 2017-07-03 ENCOUNTER — Inpatient Hospital Stay (HOSPITAL_COMMUNITY): Payer: Medicare Other | Admitting: Occupational Therapy

## 2017-07-03 ENCOUNTER — Inpatient Hospital Stay (HOSPITAL_COMMUNITY): Payer: Medicare Other | Admitting: Speech Pathology

## 2017-07-03 LAB — CBC
HCT: 34.4 % — ABNORMAL LOW (ref 36.0–46.0)
Hemoglobin: 10.7 g/dL — ABNORMAL LOW (ref 12.0–15.0)
MCH: 31.2 pg (ref 26.0–34.0)
MCHC: 31.1 g/dL (ref 30.0–36.0)
MCV: 100.3 fL — ABNORMAL HIGH (ref 78.0–100.0)
PLATELETS: 240 10*3/uL (ref 150–400)
RBC: 3.43 MIL/uL — AB (ref 3.87–5.11)
RDW: 14.4 % (ref 11.5–15.5)
WBC: 5.3 10*3/uL (ref 4.0–10.5)

## 2017-07-03 LAB — GLUCOSE, CAPILLARY
GLUCOSE-CAPILLARY: 168 mg/dL — AB (ref 65–99)
GLUCOSE-CAPILLARY: 141 mg/dL — AB (ref 65–99)
GLUCOSE-CAPILLARY: 144 mg/dL — AB (ref 65–99)
Glucose-Capillary: 172 mg/dL — ABNORMAL HIGH (ref 65–99)

## 2017-07-03 LAB — BASIC METABOLIC PANEL
Anion gap: 7 (ref 5–15)
BUN: 10 mg/dL (ref 6–20)
CALCIUM: 8.3 mg/dL — AB (ref 8.9–10.3)
CO2: 30 mmol/L (ref 22–32)
CREATININE: 0.84 mg/dL (ref 0.44–1.00)
Chloride: 103 mmol/L (ref 101–111)
Glucose, Bld: 214 mg/dL — ABNORMAL HIGH (ref 65–99)
Potassium: 3.7 mmol/L (ref 3.5–5.1)
SODIUM: 140 mmol/L (ref 135–145)

## 2017-07-03 NOTE — Progress Notes (Signed)
Subjective/Complaints: No new complaints.  Did well after decannulation.  Very pleased that her trach is out  ROS: pt denies nausea, vomiting, diarrhea, cough, shortness of breath or chest pain   Objective: Vital Signs: Blood pressure (!) 156/66, pulse 62, temperature 97.7 F (36.5 C), temperature source Oral, resp. rate 16, height 5' 1" (1.549 m), weight 71.5 kg (157 lb 10.1 oz), SpO2 95 %. No results found. Results for orders placed or performed during the hospital encounter of 06/23/17 (from the past 72 hour(s))  Glucose, capillary     Status: Abnormal   Collection Time: 06/30/17 11:32 AM  Result Value Ref Range   Glucose-Capillary 137 (H) 65 - 99 mg/dL  Glucose, capillary     Status: Abnormal   Collection Time: 06/30/17  4:11 PM  Result Value Ref Range   Glucose-Capillary 135 (H) 65 - 99 mg/dL  Glucose, capillary     Status: Abnormal   Collection Time: 06/30/17  9:09 PM  Result Value Ref Range   Glucose-Capillary 157 (H) 65 - 99 mg/dL   Comment 1 Notify RN   Glucose, capillary     Status: Abnormal   Collection Time: 07/01/17  6:34 AM  Result Value Ref Range   Glucose-Capillary 163 (H) 65 - 99 mg/dL   Comment 1 Notify RN   Glucose, capillary     Status: Abnormal   Collection Time: 07/01/17 11:31 AM  Result Value Ref Range   Glucose-Capillary 165 (H) 65 - 99 mg/dL  Glucose, capillary     Status: Abnormal   Collection Time: 07/01/17  4:48 PM  Result Value Ref Range   Glucose-Capillary 112 (H) 65 - 99 mg/dL  Glucose, capillary     Status: Abnormal   Collection Time: 07/01/17  9:41 PM  Result Value Ref Range   Glucose-Capillary 172 (H) 65 - 99 mg/dL   Comment 1 Notify RN   Glucose, capillary     Status: Abnormal   Collection Time: 07/02/17  6:48 AM  Result Value Ref Range   Glucose-Capillary 166 (H) 65 - 99 mg/dL   Comment 1 Notify RN   Glucose, capillary     Status: Abnormal   Collection Time: 07/02/17 12:01 PM  Result Value Ref Range   Glucose-Capillary 156 (H) 65  - 99 mg/dL  Glucose, capillary     Status: Abnormal   Collection Time: 07/02/17  4:38 PM  Result Value Ref Range   Glucose-Capillary 111 (H) 65 - 99 mg/dL  Glucose, capillary     Status: Abnormal   Collection Time: 07/02/17  9:15 PM  Result Value Ref Range   Glucose-Capillary 173 (H) 65 - 99 mg/dL   Comment 1 Notify RN   Glucose, capillary     Status: Abnormal   Collection Time: 07/03/17  7:33 AM  Result Value Ref Range   Glucose-Capillary 168 (H) 65 - 99 mg/dL   Comment 1 Notify RN   CBC     Status: Abnormal   Collection Time: 07/03/17  8:04 AM  Result Value Ref Range   WBC 5.3 4.0 - 10.5 K/uL   RBC 3.43 (L) 3.87 - 5.11 MIL/uL   Hemoglobin 10.7 (L) 12.0 - 15.0 g/dL   HCT 34.4 (L) 36.0 - 46.0 %   MCV 100.3 (H) 78.0 - 100.0 fL   MCH 31.2 26.0 - 34.0 pg   MCHC 31.1 30.0 - 36.0 g/dL   RDW 14.4 11.5 - 15.5 %   Platelets 240 150 - 400 K/uL  HEENT: Lurline Idol out, stoma closing.  Voice slightly hoarse Cardio: RRR without murmur. No JVD       Resp: CTA Bilaterally without wheezes or rales. Normal effort  GI: BS positive and NT, ND Extremity:   Trace lower extremity edema.  Skin warm, pulses intact Skin:   Intact and Other trach site as above Neuro: Alert/Oriented, Normal Sensory and Abnormal Motor 4/5 in BUE and BLE Musc/Skel:  Normal Psych: Calm and pleasant   Assessment/Plan: 1. Functional deficits secondary to Debilty after VDRF which require 3+ hours per day of interdisciplinary therapy in a comprehensive inpatient rehab setting. Physiatrist is providing close team supervision and 24 hour management of active medical problems listed below. Physiatrist and rehab team continue to assess barriers to discharge/monitor patient progress toward functional and medical goals. FIM: Function - Bathing Position: Wheelchair/chair at sink Body parts bathed by patient: Right arm, Left arm, Chest, Abdomen, Right upper leg, Left upper leg, Right lower leg, Left lower leg Body parts bathed  by helper: Back Bathing not applicable: Front perineal area, Buttocks Assist Level: Touching or steadying assistance(Pt > 75%)  Function- Upper Body Dressing/Undressing What is the patient wearing?: Pull over shirt/dress Bra - Perfomed by helper: Thread/unthread right bra strap, Thread/unthread left bra strap, Hook/unhook bra (pull down sports bra) Pull over shirt/dress - Perfomed by patient: Thread/unthread right sleeve, Pull shirt over trunk, Put head through opening, Thread/unthread left sleeve Pull over shirt/dress - Perfomed by helper: Put head through opening Assist Level: Set up Set up : To obtain clothing/put away Function - Lower Body Dressing/Undressing What is the patient wearing?: Pants, Non-skid slipper socks Position: Wheelchair/chair at sink Underwear - Performed by patient: Thread/unthread right underwear leg, Thread/unthread left underwear leg, Pull underwear up/down Underwear - Performed by helper: Pull underwear up/down Pants- Performed by patient: Thread/unthread right pants leg, Thread/unthread left pants leg, Pull pants up/down Pants- Performed by helper: Thread/unthread left pants leg Non-skid slipper socks- Performed by patient: Don/doff right sock, Don/doff left sock Non-skid slipper socks- Performed by helper: Don/doff right sock, Don/doff left sock Assist for footwear: Supervision/touching assist Assist for lower body dressing: Touching or steadying assistance (Pt > 75%)  Function - Toileting Toileting steps completed by patient: Adjust clothing prior to toileting, Performs perineal hygiene, Adjust clothing after toileting Toileting steps completed by helper: Adjust clothing after toileting Toileting Assistive Devices: Grab bar or rail Assist level: Touching or steadying assistance (Pt.75%)  Function - Air cabin crew transfer assistive device: Walker, Grab bar Assist level to toilet: Touching or steadying assistance (Pt > 75%) Assist level from  toilet: Touching or steadying assistance (Pt > 75%) Assist level to bedside commode (at bedside): Touching or steadying assistance (Pt > 75%) Assist level from bedside commode (at bedside): Touching or steadying assistance (Pt > 75%)  Function - Chair/bed transfer Chair/bed transfer method: Stand pivot Chair/bed transfer assist level: Touching or steadying assistance (Pt > 75%) Chair/bed transfer assistive device: Armrests, Bedrails Chair/bed transfer details: Verbal cues for precautions/safety, Verbal cues for technique  Function - Locomotion: Wheelchair Will patient use wheelchair at discharge?: No Type: Manual Max wheelchair distance: 120 Assist Level: Dependent (Pt equals 0%) Assist Level: Dependent (Pt equals 0%) Wheel 150 feet activity did not occur: Safety/medical concerns Turns around,maneuvers to table,bed, and toilet,negotiates 3% grade,maneuvers on rugs and over doorsills: No Function - Locomotion: Ambulation Assistive device: Walker-rolling Max distance: 130 Assist level: Touching or steadying assistance (Pt > 75%) Assist level: Touching or steadying assistance (Pt > 75%) Walk 50 feet  with 2 turns activity did not occur: Safety/medical concerns Assist level: Touching or steadying assistance (Pt > 75%) Walk 150 feet activity did not occur: Safety/medical concerns Walk 10 feet on uneven surfaces activity did not occur: Safety/medical concerns  Function - Comprehension Comprehension: Auditory Comprehension assist level: Follows basic conversation/direction with no assist  Function - Expression Expression: Verbal Expression assistive device: Talk trach valve Expression assist level: Expresses basic needs/ideas: With no assist  Function - Social Interaction Social Interaction assist level: Interacts appropriately with others with medication or extra time (anti-anxiety, antidepressant).  Function - Problem Solving Problem solving assist level: Solves basic 75 - 89% of  the time/requires cueing 10 - 24% of the time  Function - Memory Memory assist level: Recognizes or recalls 50 - 74% of the time/requires cueing 25 - 49% of the time Patient normally able to recall (first 3 days only): Current season, Staff names and faces, That he or she is in a hospital  Medical Problem List and Plan: 1. Debility secondary to VDRF   -Continue inpatient rehab PT, OT, speech therapy.      -Team conference today, discharge date is 07/03/2017  2. DVT Prophylaxis/Anticoagulation: Pharmaceutical: Xarelto 3. Pain Management: acetaminophen 4. Mood/anxiety:  Continued ego support fRom team for anxiety    -  Xanax   0.25 every 8 hours as needed    -Continue low-dose Seroquel to assist with sleep 25 mg at 9 PM nightly     5. Neuropsych: This patient is capable of making decisions on her own behalf. 6. Skin/Wound Care: Prophyllactic foam dressing to sacrum 7. Fluids/Electrolytes/Nutrition: carb mod diet 8. 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 Change to po, no seizures since rehab admit 9. VDRF/respiratory: Continue compressive dressing to the trach stoma.  Discussed finger occlusion of the stoma when she is speaking or bearing down. 10. 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  11. 50k klebsiella in urine:  cipro 240m bid x 3 days   -Having some associated yeast as well.  Give Diflucan x2 doses 12. DM2: Resumed metformin 250 mg twice daily.  Home dose is 500 mg twice daily   -Observe for pattern  LOS (Days) 10 A FACE TO FACE EVALUATION WAS PERFORMED  Lively Haberman T 07/03/2017, 9:05 AM

## 2017-07-03 NOTE — Plan of Care (Signed)
  RH BOWEL ELIMINATION RH STG MANAGE BOWEL WITH ASSISTANCE Description STG Manage Bowel with [place].  STG Manage Bowel with Assistance.  07/03/2017 0202 - Not Progressing by Evelena Asa, RN  Last [facility] 01/74/94

## 2017-07-03 NOTE — Progress Notes (Signed)
Physical Therapy Session Note  Patient Details  Name: Amanda Short MRN: [id number] Date of Birth: 1940/08/11  Today's Date: 07/03/2017 PT Individual Time: 0092-3300 PT Individual Time Calculation (min): 60 min   Short Term Goals: Week 2:  PT Short Term Goal 1 (Week 2): Patient will perform sit to stand transfers with S PT Short Term Goal 2 (Week 2): Patient will perform bed mobility with S. PT Short Term Goal 3 (Week 2): Patient will ambulate 150' with minguard A PT Short Term Goal 4 (Week 2): Patient will perform car transfer min A  Skilled Therapeutic Interventions/Progress Updates:    Patient in w/c in room reports had a shower this morning.  Remains on 2L O2 [place].  Patient sit to stand with S and ambulated with RW minguard A with 2L O2 x 70' and 50' seated rest due to fatigue.  Ambulated on RA x 150' noted desats to 86% (see separate note).  Patient negotiated 4 steps with rails and min A step through to ascend, step to for descent.  Standing balance activities in parallel bars including stepping over and back, partial tandem stand, tandem stand, feet together with head turns, feet apart eyes closed and toe taps to bead bags one UE support.  Patient ambulated back to room close S to minguard A with RW due to c/o some wooziness at times after balance activities.  Patient left in w/c in room with all needs in reach.   Therapy Documentation Precautions:  Precautions Precautions: Fall Precaution Comments: trach removed 12/17, monitor 02 Restrictions Weight Bearing Restrictions: No Pain: Pain Assessment Pain Assessment: No/denies pain   See Function Navigator for Current Functional Status.   Therapy/Group: Individual Therapy  Reginia Naas 07/03/2017, 10:29 AM

## 2017-07-03 NOTE — Progress Notes (Signed)
Occupational Therapy Session Note  Patient Details  Name: Amanda Short MRN: [id number] Date of Birth: 1940/09/18  Today's Date: 07/03/2017 OT Individual Time: 9211-9417 OT Individual Time Calculation (min): 75 min    Short Term Goals: Week 1:  OT Short Term Goal 1 (Week 1): Pt will complete LB dressing with Mod A OT Short Term Goal 1 - Progress (Week 1): Met OT Short Term Goal 2 (Week 1): Pt will complete sit<stand during bathing with Min A OT Short Term Goal 2 - Progress (Week 1): Met OT Short Term Goal 3 (Week 1): Pt will complete BSC/toilet transfer with Min A and 02 sats >90% OT Short Term Goal 3 - Progress (Week 1): Met Week 2:  OT Short Term Goal 1 (Week 2): STGs=LTGs due to ELOS  Skilled Therapeutic Interventions/Progress Updates:    Upon entering the room, pt supine in bed with no c/o pain and agreeable to OT intervention. Pt very motivated for shower this session. Bed mobility performed with supervision. Pt ambulating with RW into bathroom with steady assistance on 2 L of O2 via [place]. Pt required verbal cues for safety awareness to sit down to remove LB clothing. Pt performed transfer onto TTB with min A. Bathing from TTB with min A overall for safety when standing to wash buttocks. Pt transferred onto toilet to don clothing items. Pt required min verbal cues for energy conservation techniques. Pt able to don LB clothing with min A for balance with use of RW. Pt required multiple rest breaks secondary to fatigue. Pt seated in wheelchair at sink for grooming tasks at mod I level. Pt remained in wheelchair at end of session with call bell and all needed items within reach upon exiting the room.   Therapy Documentation Precautions:  Precautions Precautions: Fall Precaution Comments: trach removed 12/17, monitor 02 Restrictions Weight Bearing Restrictions: No General:   Vital Signs:  Pain: Pain Assessment Pain Assessment: No/denies pain ADL: ADL ADL Comments: Please see  functional navigator for ADL status Vision   Perception    Praxis   Exercises:   Other Treatments:    See Function Navigator for Current Functional Status.   Therapy/Group: Individual Therapy  Gypsy Decant 07/03/2017, 12:44 PM

## 2017-07-03 NOTE — Plan of Care (Signed)
  Not Progressing RH BOWEL ELIMINATION RH STG MANAGE BOWEL WITH ASSISTANCE Description STG Manage Bowel with [place].  STG Manage Bowel with Assistance.  07/03/2017 8338 - Not Progressing by Bellamy Rubey, Renato Gails, RN RH BOWEL ELIMINATION RH STG MANAGE BOWEL WITH ASSISTANCE Description STG Manage Bowel with [place].  STG Manage Bowel with Assistance.  07/03/2017 2505 - Not Progressing by Hillery Jacks, RN  LBM 12/14. To offer suppository this afternoon

## 2017-07-03 NOTE — Progress Notes (Signed)
Physical Therapy Note  Patient Details  Name: Amanda Short MRN: [id number] Date of Birth: 1940/12/04 Today's Date: 07/03/2017    SATURATION QUALIFICATIONS: (This note is used to comply with regulatory documentation for home oxygen)  Patient Saturations on Room Air at Rest = 94%  Patient Saturations on [employer] while Ambulating = 86%  Patient Saturations on  Liters of oxygen while Ambulating = 95%  Please briefly explain why patient needs home oxygen:   Patient with hypoxia ambulating on room air.  Reginia Naas 07/03/2017, 12:19 PM

## 2017-07-03 NOTE — Progress Notes (Signed)
Speech Language Pathology Daily Session Note  Patient Details  Name: Amanda Short MRN: [id number] Date of Birth: [birth year]  Today's Date: 07/03/2017 SLP Individual Time: 1100-1200 SLP Individual Time Calculation (min): 60 min  Short Term Goals: Week 1: SLP Short Term Goal 1 (Week 1): Patient will donn and doff PMSV with Min assist verbal and visual cues  SLP Short Term Goal 2 (Week 1): Patient will utilize external aids to assist with recall of new information with Min assist question cues  SLP Short Term Goal 3 (Week 1): Patient will complete complex problem solving tasks with Min assist verbal, question cues to self-monitor and correct errors  Skilled Therapeutic Interventions: Skilled treatment session focused on cognitive goals. Patient independently utilized her daily schedule and memory notebook to record and recall events from previous therapy sessions. Patient also recalled procedures from a previously learned task with Mod I but required Min A verbal cues for complex problem solving throughout task. Patient left upright in wheelchair with family present and all needs within reach. Continue with current plan of care.      Function:   Cognition Comprehension Comprehension assist level: Follows basic conversation/direction with no assist  Expression   Expression assist level: Expresses basic needs/ideas: With no assist  Social Interaction Social Interaction assist level: Interacts appropriately with others with medication or extra time (anti-anxiety, antidepressant).  Problem Solving Problem solving assist level: Solves basic 75 - 89% of the time/requires cueing 10 - 24% of the time  Memory Memory assist level: Recognizes or recalls 75 - 89% of the time/requires cueing 10 - 24% of the time    Pain Pain Assessment Pain Assessment: No/denies pain  Therapy/Group: Individual Therapy  Nalini Alcaraz 07/03/2017, 2:14 PM

## 2017-07-03 NOTE — Progress Notes (Signed)
Small stoma opening pink with some yellow discharge noted. Area cleansed and new dry dressing applied. Continue to monitor.

## 2017-07-04 ENCOUNTER — Inpatient Hospital Stay (HOSPITAL_COMMUNITY): Payer: Medicare Other | Admitting: Speech Pathology

## 2017-07-04 ENCOUNTER — Encounter (HOSPITAL_COMMUNITY): Payer: Self-pay

## 2017-07-04 ENCOUNTER — Inpatient Hospital Stay (HOSPITAL_COMMUNITY): Payer: Medicare Other | Admitting: Occupational Therapy

## 2017-07-04 ENCOUNTER — Inpatient Hospital Stay (HOSPITAL_COMMUNITY): Payer: Medicare Other

## 2017-07-04 LAB — GLUCOSE, CAPILLARY
GLUCOSE-CAPILLARY: 122 mg/dL — AB (ref 65–99)
GLUCOSE-CAPILLARY: 120 mg/dL — AB (ref 65–99)
GLUCOSE-CAPILLARY: 145 mg/dL — AB (ref 65–99)
Glucose-Capillary: 155 mg/dL — ABNORMAL HIGH (ref 65–99)

## 2017-07-04 MED ORDER — METFORMIN HCL 500 MG PO TABS
500.0000 mg | ORAL_TABLET | Freq: Two times a day (BID) | ORAL | Status: DC
  Administered 2017-07-04 – 2017-07-05 (×2): 500 mg via ORAL
  Filled 2017-07-04 (×2): qty 1

## 2017-07-04 NOTE — Progress Notes (Signed)
Physical Therapy Session Note  Patient Details  Name: Amanda Short MRN: [id number] Date of Birth: [birth year]  Today's Date: 07/04/2017 PT Individual Time: 1400-1500 PT Individual Time Calculation (min): 60 min   Short Term Goals: Week 2:  PT Short Term Goal 1 (Week 2): Patient will perform sit to stand transfers with S PT Short Term Goal 2 (Week 2): Patient will perform bed mobility with S. PT Short Term Goal 3 (Week 2): Patient will ambulate 150' with minguard A PT Short Term Goal 4 (Week 2): Patient will perform car transfer min A  Skilled Therapeutic Interventions/Progress Updates:    Patient in w/c in room.  Educated spouse about level of assist fluctuating at times depending on fatigue level and amount of "dizziness".  Patient performed sit to stand with S and ambulated x 160' with RW and S with spouse assist on 2L O2.  Educated spouse on need for O2 with mobility currently due to desaturations yesterday on RA.  Patient negotiated 8 steps with rails and min A.  Sit<>supine on mat with min A of spouse, but S when moving to sidelying.  Patient performed car transfer with S with spouse A.. Educated in fall prevention and plan for HEP.  Printed HEP for pt and issued and reviewed with pt and spouse.  Left with spouse in room and needs in reach.   Therapy Documentation Precautions:  Precautions Precautions: Fall Precaution Comments: trach removed 12/17, monitor 02 Restrictions Weight Bearing Restrictions: No Pain: Pain Assessment Pain Assessment: No/denies pain  See Function Navigator for Current Functional Status.   Therapy/Group: Individual Therapy  Reginia Naas 07/04/2017, 6:24 PM

## 2017-07-04 NOTE — Progress Notes (Signed)
Social Work Patient ID: Amanda Short, female   DOB: [birth year], 76 y.o.   MRN: [id number]  Met with patient and her husband yesterday following team conference.  Both are looking forward to discharge tomorrow and are agreeable with recommendation of follow-up home health and DME.  Patient has requested that home health be restarted with [facility].  no further concerns.  Saturnino Liew, LCSW

## 2017-07-04 NOTE — Progress Notes (Signed)
Subjective/Complaints: States she had another good night of sleep.  No breathing issues reported.  Has not heard air leaking through the dressing ROS: pt denies nausea, vomiting, diarrhea, cough, shortness of breath or chest pain   Objective: Vital Signs: Blood pressure (!) 153/70, pulse (!) 56, temperature 98 F (36.7 C), temperature source Oral, resp. rate 20, height 5' 1" (1.549 m), weight 71.8 kg (158 lb 3.2 oz), SpO2 98 %. No results found. Results for orders placed or performed during the hospital encounter of 06/23/17 (from the past 72 hour(s))  Glucose, capillary     Status: Abnormal   Collection Time: 07/01/17 11:31 AM  Result Value Ref Range   Glucose-Capillary 165 (H) 65 - 99 mg/dL  Glucose, capillary     Status: Abnormal   Collection Time: 07/01/17  4:48 PM  Result Value Ref Range   Glucose-Capillary 112 (H) 65 - 99 mg/dL  Glucose, capillary     Status: Abnormal   Collection Time: 07/01/17  9:41 PM  Result Value Ref Range   Glucose-Capillary 172 (H) 65 - 99 mg/dL   Comment 1 Notify RN   Glucose, capillary     Status: Abnormal   Collection Time: 07/02/17  6:48 AM  Result Value Ref Range   Glucose-Capillary 166 (H) 65 - 99 mg/dL   Comment 1 Notify RN   Glucose, capillary     Status: Abnormal   Collection Time: 07/02/17 12:01 PM  Result Value Ref Range   Glucose-Capillary 156 (H) 65 - 99 mg/dL  Glucose, capillary     Status: Abnormal   Collection Time: 07/02/17  4:38 PM  Result Value Ref Range   Glucose-Capillary 111 (H) 65 - 99 mg/dL  Glucose, capillary     Status: Abnormal   Collection Time: 07/02/17  9:15 PM  Result Value Ref Range   Glucose-Capillary 173 (H) 65 - 99 mg/dL   Comment 1 Notify RN   Glucose, capillary     Status: Abnormal   Collection Time: 07/03/17  7:33 AM  Result Value Ref Range   Glucose-Capillary 168 (H) 65 - 99 mg/dL   Comment 1 Notify RN   Basic metabolic panel     Status: Abnormal   Collection Time: 07/03/17  8:04 AM  Result Value  Ref Range   Sodium 140 135 - 145 mmol/L   Potassium 3.7 3.5 - 5.1 mmol/L   Chloride 103 101 - 111 mmol/L   CO2 30 22 - 32 mmol/L   Glucose, Bld 214 (H) 65 - 99 mg/dL   BUN 10 6 - 20 mg/dL   Creatinine, Ser 0.84 0.44 - 1.00 mg/dL   Calcium 8.3 (L) 8.9 - 10.3 mg/dL   GFR calc non Af Amer >60 >60 mL/min   GFR calc Af Amer >60 >60 mL/min    Comment: (NOTE) The eGFR has been calculated using the CKD EPI equation. This calculation has not been validated in all clinical situations. eGFR's persistently <60 mL/min signify possible Chronic Kidney Disease.    Anion gap 7 5 - 15  CBC     Status: Abnormal   Collection Time: 07/03/17  8:04 AM  Result Value Ref Range   WBC 5.3 4.0 - 10.5 K/uL   RBC 3.43 (L) 3.87 - 5.11 MIL/uL   Hemoglobin 10.7 (L) 12.0 - 15.0 g/dL   HCT 34.4 (L) 36.0 - 46.0 %   MCV 100.3 (H) 78.0 - 100.0 fL   MCH 31.2 26.0 - 34.0 pg   MCHC 31.1 30.0 -  36.0 g/dL   RDW 14.4 11.5 - 15.5 %   Platelets 240 150 - 400 K/uL  Glucose, capillary     Status: Abnormal   Collection Time: 07/03/17 12:16 PM  Result Value Ref Range   Glucose-Capillary 141 (H) 65 - 99 mg/dL  Glucose, capillary     Status: Abnormal   Collection Time: 07/03/17  4:32 PM  Result Value Ref Range   Glucose-Capillary 144 (H) 65 - 99 mg/dL  Glucose, capillary     Status: Abnormal   Collection Time: 07/03/17  8:54 PM  Result Value Ref Range   Glucose-Capillary 172 (H) 65 - 99 mg/dL  Glucose, capillary     Status: Abnormal   Collection Time: 07/04/17  6:38 AM  Result Value Ref Range   Glucose-Capillary 155 (H) 65 - 99 mg/dL   Comment 1 Notify RN      HEENT: No leakage through the dressing on exam but when I removed the dressing there is audible air movement through stoma Cardio: RRR without murmur. No JVD        Resp: CTA Bilaterally without wheezes or rales. Normal effort  GI: BS positive and NT, ND Extremity:   T trace lower extremity edema Skin:   Intact and Other trach site as above Neuro:  Alert/Oriented, Normal Sensory and Abnormal Motor 4/5 in BUE and BLE Musc/Skel:  Normal Psych: Calm and pleasant   Assessment/Plan: 1. Functional deficits secondary to Debilty after VDRF which require 3+ hours per day of interdisciplinary therapy in a comprehensive inpatient rehab setting. Physiatrist is providing close team supervision and 24 hour management of active medical problems listed below. Physiatrist and rehab team continue to assess barriers to discharge/monitor patient progress toward functional and medical goals. FIM: Function - Bathing Position: Shower Body parts bathed by patient: Right arm, Left arm, Chest, Abdomen, Right upper leg, Left upper leg, Right lower leg, Left lower leg, Buttocks, Front perineal area Body parts bathed by helper: Back Bathing not applicable: Front perineal area, Buttocks Assist Level: Touching or steadying assistance(Pt > 75%)  Function- Upper Body Dressing/Undressing What is the patient wearing?: Pull over shirt/dress Bra - Perfomed by helper: Thread/unthread right bra strap, Thread/unthread left bra strap, Hook/unhook bra (pull down sports bra) Pull over shirt/dress - Perfomed by patient: Thread/unthread right sleeve, Pull shirt over trunk, Put head through opening, Thread/unthread left sleeve Pull over shirt/dress - Perfomed by helper: Put head through opening Assist Level: Supervision or verbal cues Set up : To obtain clothing/put away Function - Lower Body Dressing/Undressing What is the patient wearing?: Pants, Non-skid slipper socks, Underwear Position: Wheelchair/chair at sink Underwear - Performed by patient: Thread/unthread right underwear leg, Thread/unthread left underwear leg, Pull underwear up/down Underwear - Performed by helper: Pull underwear up/down Pants- Performed by patient: Thread/unthread right pants leg, Thread/unthread left pants leg, Pull pants up/down Pants- Performed by helper: Thread/unthread left pants  leg Non-skid slipper socks- Performed by patient: Don/doff right sock, Don/doff left sock Non-skid slipper socks- Performed by helper: Don/doff right sock, Don/doff left sock Assist for footwear: Supervision/touching assist Assist for lower body dressing: Supervision or verbal cues  Function - Toileting Toileting steps completed by patient: Adjust clothing prior to toileting, Performs perineal hygiene Toileting steps completed by helper: Adjust clothing after toileting Toileting Assistive Devices: Grab bar or rail Assist level: Touching or steadying assistance (Pt.75%)  Function - Air cabin crew transfer assistive device: Walker, Grab bar Assist level to toilet: Touching or steadying assistance (Pt > 75%) Assist level  from toilet: Touching or steadying assistance (Pt > 75%) Assist level to bedside commode (at bedside): Touching or steadying assistance (Pt > 75%) Assist level from bedside commode (at bedside): Touching or steadying assistance (Pt > 75%)  Function - Chair/bed transfer Chair/bed transfer method: Stand pivot Chair/bed transfer assist level: Supervision or verbal cues Chair/bed transfer assistive device: Armrests, Bedrails, Walker Chair/bed transfer details: Verbal cues for safe use of DME/AE, Verbal cues for technique  Function - Locomotion: Wheelchair Will patient use wheelchair at discharge?: No Type: Manual Max wheelchair distance: 120 Assist Level: Dependent (Pt equals 0%) Assist Level: Dependent (Pt equals 0%) Wheel 150 feet activity did not occur: Safety/medical concerns Turns around,maneuvers to table,bed, and toilet,negotiates 3% grade,maneuvers on rugs and over doorsills: No Function - Locomotion: Ambulation Assistive device: Walker-rolling Max distance: 150 Assist level: Touching or steadying assistance (Pt > 75%) Assist level: Supervision or verbal cues Walk 50 feet with 2 turns activity did not occur: Safety/medical concerns Assist level:  Touching or steadying assistance (Pt > 75%) Walk 150 feet activity did not occur: Safety/medical concerns Assist level: Touching or steadying assistance (Pt > 75%) Walk 10 feet on uneven surfaces activity did not occur: Safety/medical concerns  Function - Comprehension Comprehension: Auditory Comprehension assist level: Follows basic conversation/direction with no assist  Function - Expression Expression: Verbal Expression assistive device: Talk trach valve Expression assist level: Expresses basic needs/ideas: With no assist  Function - Social Interaction Social Interaction assist level: Interacts appropriately with others with medication or extra time (anti-anxiety, antidepressant).  Function - Problem Solving Problem solving assist level: Solves basic 75 - 89% of the time/requires cueing 10 - 24% of the time  Function - Memory Memory assist level: Recognizes or recalls 75 - 89% of the time/requires cueing 10 - 24% of the time Patient normally able to recall (first 3 days only): Current season, Staff names and faces, That he or she is in a hospital  Medical Problem List and Plan: 1. Debility secondary to VDRF   -Continue inpatient rehab PT, OT, speech therapy.      -Finalize DC planning for 07/05/2017   -Blood patient follow-up with me in 2-4 weeks at the office 2. DVT Prophylaxis/Anticoagulation: Pharmaceutical: Xarelto 3. Pain Management: acetaminophen 4. Mood/anxiety:  Continued ego support fRom team for anxiety    -  Xanax   0.25 every 8 hours as needed    -Continue low-dose Seroquel to assist with sleep 25 mg at 9 PM nightly     5. Neuropsych: This patient is capable of making decisions on her own behalf. 6. Skin/Wound Care: Prophyllactic foam dressing to sacrum 7. Fluids/Electrolytes/Nutrition: carb mod diet 8. 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 Change to po, no seizures since rehab admit 9. VDRF/respiratory: Continue compressive dressing to the trach stoma.   Discussed finger occlusion of the stoma when she is speaking or bearing down. 10. 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  11. 50k klebsiella in urine:  cipro 265m bid x 3 days--completed   -Having some associated yeast as well.  Gave Diflucan x2 doses 12. DM2: Resumed metformin 250 mg twice daily.  Home dose is 500 mg twice daily   -Improved glucose control.  We will go ahead and increase to 500 mg twice daily  LOS (Days) 11 A FACE TO FACE EVALUATION WAS PERFORMED  SWARTZ,ZACHARY T 07/04/2017, 9:03 AM

## 2017-07-04 NOTE — Patient Instructions (Signed)
Bridging    Slowly raise buttocks from floor, keeping stomach tight. Repeat _10___ times per set. Do __1__ sets per session. Do _2___ sessions per day.  http://orth.exer.us/1096   Copyright  VHI. All rights reserved.  Functional Quadriceps: Sit to Stand    Sit on edge of chair, feet flat on floor. Stand upright, extending knees fully. Repeat __5__ times per set. Do __2__ sets per session. Do __2__ sessions per day.  http://orth.exer.us/734   Copyright  VHI. All rights reserved.  Strengthening: Straight Leg Raise (Phase 1)    Tighten muscles on front of right thigh, then lift leg __6__ inches from surface, keeping knee locked.  Repeat __10__ times per set. Do _1___ sets per session. Do __2__ sessions per day.  http://orth.exer.us/614   Copyright  VHI. All rights reserved.  ABDUCTION: Standing (Active)    Stand, feet flat. Lift right leg out to side.  Complete _1__ sets of 10___ repetitions. Perform __2_ sessions per day.  http://gtsc.exer.us/110   Copyright  VHI. All rights reserved.  EXTENSION: Standing (Active)    Stand, both feet flat. Draw right leg behind body as far as possible.  Complete _1__ sets of _10__ repetitions. Perform __2_ sessions per day.  http://gtsc.exer.us/76   Copyright  VHI. All rights reserved.

## 2017-07-04 NOTE — Patient Care Conference (Signed)
Inpatient RehabilitationTeam Conference and Plan of Care Update Date: 07/03/2017   Time: 2:40 PM    Patient Name: Amanda Short      Medical Record Number: [id number]  Date of Birth: Dec 19, 1940 Sex: Female         Room/Bed: 4W20C/4W20C-01 Payor Info: Payor: [employer] MEDICARE / Plan: [employer] MEDICARE / Product Type: *No Product type* /    Admitting Diagnosis: Debility  Admit Date/Time:  06/23/2017  3:19 PM Admission Comments: No comment available   Primary Diagnosis:  Physical debility Principal Problem: Physical debility  Patient Active Problem List   Diagnosis Date Noted  . Anxiety state   . Physical debility 06/23/2017  . 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 History of acute respiratory failure   . Tracheostomy, acute management ([place])   . Aspiration pneumonia ([place])   . Encephalopathy   . Status epilepticus ([place])   . Benign essential HTN   . 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 History of pulmonary embolism   . Acute blood loss anemia   . Diverticulitis of colon   . Tachypnea   . Encounter for orogastric (OG) tube placement   . Encounter for central line placement   . Encounter for attention to tracheostomy ([place])   . Acute respiratory failure with hypoxia ([place])   . Acute encephalopathy 06/06/2017  . Diverticulitis of large intestine without perforation or abscess without bleeding   . Abdominal pain   . Non-intractable cyclical vomiting with nausea   . Abnormal CT of the abdomen   . Hypocalcemia 05/27/2017  . 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 Colitis 05/27/2017  . Diabetic foot infection ([place]) 07/21/2015  . Diabetes mellitus type 2, controlled ([place]) 07/21/2015  . Cellulitis of foot, right 07/21/2015  . Cellulitis of right foot 07/21/2015  . Acute pulmonary embolism ([place]) 04/01/2013  . DVT (deep venous thrombosis) ([place]) 04/01/2013  . HX: breast cancer 04/01/2013  . Concussion 04/01/2013  . Diabetes ([place]) 04/01/2013  . Essential hypertension,  benign 04/01/2013  . Unspecified hypothyroidism 04/01/2013  . OSA on CPAP 04/01/2013    Expected Discharge Date: Expected Discharge Date: 07/05/17  Team Members Present: Physician leading conference: Dr. Alger Simons Social Worker Present: Lennart Pall, LCSW Nurse Present: Dorien Chihuahua, RN PT Present: Other (comment)(Cindy Rick Duff, PT) OT Present: Benay Pillow, OT SLP Present: Weston Anna, SLP PPS Coordinator present : Daiva Nakayama, RN, CRRN     Current Status/Progress Goal Weekly Team Focus  Medical   Gradual medical improvement.  She was decannulated today and has done well with the trach plugged prior to that  Increase exercise capacity and decrease anxiety  Remove trach, anxiety control, optimize nutrition   Bowel/Bladder   Continent of B/B, Last BM 06/29/17  Continent of B/B, BM Q 2days  Assess and assist Qshift and PRN   Swallow/Nutrition/ Hydration             ADL's   Steady assist bathing sit<stand at sink, Min A UB dressing, steady assist LB dressing, Min A functional toilet transfers with RW at ambulatory level  Supervision/cuing   pt/family education, d/c planning, balance, ADL retraining, activity tolerance, energy conservation   Mobility   minguard transfers and gait 130' RW stairs min A  S overall  Gait, balance, vestibular rehab, endurance, caregiver ed   Communication   Mod I  Mod I  Goals Met   Safety/Cognition/ Behavioral Observations  Min-Mod A  Supervision  complex problem solving & organization, recall  Pain   Denies Pain  Pain<2  Assess Qshit and PRN   Skin   Improved MASD to buttocks/groin  Prevent breakdown and infection  Assess Qshift and PRN, Apply cream after incontinent episodes    Rehab Goals Patient on target to meet rehab goals: Yes *See Care Plan and progress notes for long and short-term goals.     Barriers to Discharge  Current Status/Progress Possible Resolutions Date Resolved   Physician    Medical stability        See prior       Nursing                  PT                    OT                  SLP                SW                Discharge Planning/Teaching Needs:  Pt to d/c home with spouse who can provide 24/7 assistance.  Teaching completed with husband.   Team Discussion:  Pt on track to meet supervision goals overall.  Memory improving daily.  Plan home with O2 (already has set up at home)  Revisions to Treatment Plan:  None    Continued Need for Acute Rehabilitation Level of Care: The patient requires daily medical management by a physician with specialized training in physical medicine and rehabilitation for the following conditions: Daily direction of a multidisciplinary physical rehabilitation program to ensure safe treatment while eliciting the highest outcome that is of practical value to the patient.: Yes Daily medical management of patient stability for increased activity during participation in an intensive rehabilitation regime.: Yes Daily analysis of laboratory values and/or radiology reports with any subsequent need for medication adjustment of medical intervention for : Neurological problems;Pulmonary problems  Briselda Naval 07/04/2017, 2:08 PM

## 2017-07-04 NOTE — Progress Notes (Signed)
Occupational Therapy Discharge Summary  Patient Details  Name: Amanda Short MRN: [id number] Date of Birth: 13-Mar-1941  Today's Date: 07/04/2017 OT Individual Time: 2130-8657 OT Individual Time Calculation (min): 75 min    Patient has met 10 of 10 long term goals due to improved activity tolerance, improved balance, postural control, ability to compensate for deficits, improved attention, improved awareness and improved coordination.  Patient to discharge at overall Supervision level.  Patient's care partner is independent to provide the necessary physical and cognitive assistance at discharge.    Reasons goals not met: all goals met  Recommendation:  Patient will benefit from ongoing skilled OT services in home health setting to continue to advance functional skills in the area of BADL.  Equipment: TTB  Reasons for discharge: treatment goals met  Patient/family agrees with progress made and goals achieved: Yes   OT Intervention: Pt supine in bed upon entering the room with no c/o pain this session. Pt's husband present for family education. OT reviewed pt's progress, safety handling techniques, proper cues to give to pt, and recommendations for energy conservation as pt fatigues throughout the day. Caregiver demonstrated understanding and provided supervision and cues as needed for pt to perform toileting, bathing from shower level, and dressing tasks for EOB with overall supervision. Caregiver and pt with no further needs or questions at this time. Call bell and all needed items within reach upon exiting the room.   OT Discharge Precautions/Restrictions  Precautions Precautions: Fall Restrictions Weight Bearing Restrictions: No Vital Signs Therapy Vitals Temp: 98.1 F (36.7 C) Temp Source: Oral Pulse Rate: 60 Resp: 17 BP: (!) 164/69 Patient Position (if appropriate): Sitting Oxygen Therapy SpO2: 99 % O2 Device: Nasal Cannula O2 Flow Rate (L/min): 2 L/min Pain Pain  Assessment Pain Assessment: No/denies pain ADL ADL ADL Comments: Please see functional navigator for ADL status Vision Patient Visual Report: No change from baseline Cognition Overall Cognitive Status: Impaired/Different from baseline Arousal/Alertness: Awake/alert Orientation Level: Oriented X4 Attention: Selective Selective Attention: Appears intact Memory: Impaired Memory Impairment: Decreased recall of new information Awareness: Appears intact Problem Solving: Impaired Problem Solving Impairment: Functional complex Executive Function: Self Monitoring;Self Correcting Self Monitoring: Impaired Self Monitoring Impairment: Functional complex Self Correcting: Impaired Self Correcting Impairment: Functional complex Safety/Judgment: Appears intact Sensation Sensation Light Touch: Appears Intact Coordination Gross Motor Movements are Fluid and Coordinated: Yes Fine Motor Movements are Fluid and Coordinated: Yes Motor  Motor Motor - Discharge Observations: generalized weakness Mobility  Transfers Transfers: Sit to Stand;Stand to Sit Sit to Stand: 5: Supervision Stand to Sit: 5: Supervision  Trunk/Postural Assessment  Cervical Assessment Cervical Assessment: Within Functional Limits Thoracic Assessment Thoracic Assessment: Within Functional Limits Lumbar Assessment Lumbar Assessment: Within Functional Limits Postural Control Postural Control: Deficits on evaluation  Balance Balance Balance Assessed: Yes Dynamic Sitting Balance Dynamic Sitting - Level of Assistance: 6: Modified independent (Device/Increase time) Static Standing Balance Static Standing - Level of Assistance: 5: Stand by assistance Dynamic Standing Balance Dynamic Standing - Level of Assistance: 5: Stand by assistance Extremity/Trunk Assessment RUE Assessment RUE Assessment: Within Functional Limits LUE Assessment LUE Assessment: Within Functional Limits   See Function Navigator for Current  Functional Status.  Gypsy Decant 07/04/2017, 5:22 PM

## 2017-07-04 NOTE — Progress Notes (Signed)
Speech Language Pathology Discharge Summary  Patient Details  Name: Amanda Short MRN: [id number] Date of Birth: 23-May-1941  Today's Date: 07/04/2017 SLP Individual Time: 1005-1100 SLP Individual Time Calculation (min): 55 min   Skilled Therapeutic Interventions:  Pt was seen for skilled ST targeting cognitive goals.  SLP re-administered the [facility] standardized cognitive assessment to measure progress from initial evaluation.  Pt scored 27/30 on assessment (n>/= 26) which pt's husband subjectively reports to be improved from first attempt to complete assessment.  SLP reviewed and reinforced memory compensatory strategies which pt recorded into her memory notebook with supervision verbal cues for organization of information.  All questions were answered to pt's and husband's satisfaction at this time.  Pt was left in wheelchair with husband at bedside.  Pt is ready for discharge tomorrow.      Patient has met 2 of 3 long term goals.  Patient to discharge at overall Supervision level.  Reasons goals not met:     Clinical Impression/Discharge Summary:  Pt has made functional gains while inpatient and is discharging having met 2 out of 3 long term goals.  Pt is now decannulated but prior to decannulation pt was able to return demonstration of how to donn/doff speaking valve with mod I.  Pt is currently supervision for cognitive tasks due to mild deficits, specifically impacting recall of new information.  Pt is discharging home with 24/7 supervision from family.  Pt and family education is complete at this time.    Care Partner:   Caregiver Able to Provide Assistance: Yes  Type of Caregiver Assistance: Physical;Cognitive  Recommendation:  24 hour supervision/assistance;Outpatient SLP  Rationale for SLP Follow Up: Maximize cognitive function and independence   Equipment: none recommended by SLP    Reasons for discharge: Discharged from hospital   Patient/Family Agrees with Progress Made  and Goals Achieved: Yes   Function:  Eating Eating                 Cognition Comprehension Comprehension assist level: Follows basic conversation/direction with no assist  Expression   Expression assist level: Expresses basic needs/ideas: With no assist  Social Interaction Social Interaction assist level: Interacts appropriately with others - No medications needed.  Problem Solving Problem solving assist level: Solves basic 90% of the time/requires cueing < 10% of the time  Memory Memory assist level: Recognizes or recalls 75 - 89% of the time/requires cueing 10 - 24% of the time   Emilio Math 07/04/2017, 1:38 PM

## 2017-07-04 NOTE — Progress Notes (Signed)
Physical Therapy Discharge Summary  Patient Details  Name: Amanda Short MRN: [id number] Date of Birth: 1940/08/31  Today's Date: 07/04/2017 PT Individual Time: 1400-1500 PT Individual Time Calculation (min): 60 min    Patient has met 5 of 7 long term goals due to improved activity tolerance, improved balance and increased strength.  Patient to discharge at an ambulatory level Supervision.   Patient's care partner is independent to provide the necessary physical assistance at discharge.  Reasons goals not met: Patient not ready for community outing during stay so did not practice community ambulation; and did not achieve Mod I for bed mobility due to continued core strength deficits.  Will benefit from continued HHPT to address strength and balance issues.  Recommendation:  Patient will benefit from ongoing skilled PT services in home health setting to continue to advance safe functional mobility, address ongoing impairments in balance, strength, and minimize fall risk.  Equipment: youth walker  Reasons for discharge: discharge from hospital  Patient/family agrees with progress made and goals achieved: Yes  PT Discharge Precautions/Restrictions Precautions Precautions: Fall Restrictions Weight Bearing Restrictions: No Vital Signs  Pain Pain Assessment Pain Assessment: No/denies pain Vision/Perception     Cognition Overall Cognitive Status: Impaired/Different from baseline Arousal/Alertness: Awake/alert Orientation Level: Oriented X4 Attention: Selective Selective Attention: Appears intact Memory: Impaired Memory Impairment: Decreased recall of new information Awareness: Appears intact Problem Solving: Impaired Problem Solving Impairment: Functional complex Executive Function: Self Monitoring;Self Correcting Self Monitoring: Impaired Self Monitoring Impairment: Functional complex Self Correcting: Impaired Self Correcting Impairment: Functional  complex Safety/Judgment: Appears intact Sensation Sensation Light Touch: Appears Intact Additional Comments: B LE sensation intact  Coordination Gross Motor Movements are Fluid and Coordinated: Yes Fine Motor Movements are Fluid and Coordinated: Yes Motor  Motor Motor - Discharge Observations: generalized weakness  Mobility Bed Mobility Bed Mobility: Supine to Sit;Sit to Supine Supine to Sit: 5: Supervision Supine to Sit Details (indicate cue type and reason): spouse performed with pt with min A, but S with PT in sessions with cues for technique Sit to Supine: 5: Supervision Sit to Supine - Details (indicate cue type and reason): spouse performed with pt with min A, but S with PT in sessions with cues for technique Transfers Transfers: Yes Sit to Stand: 5: Supervision Sit to Stand Details: Verbal cues for safe use of DME/AE Stand to Sit: 5: Supervision Stand to Sit Details (indicate cue type and reason): Verbal cues for safe use of DME/AE Locomotion  Ambulation Ambulation: Yes Ambulation/Gait Assistance: 5: Supervision Ambulation Distance (Feet): 160 Feet Assistive device: Rolling walker Stairs / Additional Locomotion Stairs: Yes Stairs Assistance: 4: Min assist Stair Management Technique: Step to pattern;Forwards;Two rails Ramp: 4: Min Administrator Mobility: No  Trunk/Postural Assessment  Cervical Assessment Cervical Assessment: Within Functional Limits Thoracic Assessment Thoracic Assessment: Within Functional Limits Lumbar Assessment Lumbar Assessment: Within Functional Limits Postural Control Postural Control: Deficits on evaluation(rounded shoulders)  Balance Balance Balance Assessed: Yes Dynamic Sitting Balance Dynamic Sitting - Level of Assistance: 6: Modified independent (Device/Increase time) Static Standing Balance Static Standing - Level of Assistance: 5: Stand by assistance Dynamic Standing Balance Dynamic Standing - Level of  Assistance: 5: Stand by assistance Extremity Assessment  RUE Assessment RUE Assessment: Within Functional Limits LUE Assessment LUE Assessment: Within Functional Limits RLE Assessment RLE Assessment: Within Functional Limits LLE Assessment LLE Assessment: Within Functional Limits   See Function Navigator for Current Functional Status.  Reginia Naas 07/04/2017, 6:37 PM

## 2017-07-05 ENCOUNTER — Encounter (HOSPITAL_COMMUNITY): Payer: Self-pay | Admitting: Physical Medicine and Rehabilitation

## 2017-07-05 DIAGNOSIS — G40909 Epilepsy, unspecified, not intractable, without status epilepticus: Secondary | ICD-10-CM

## 2017-07-05 DIAGNOSIS — I272 Pulmonary hypertension, unspecified: Secondary | ICD-10-CM

## 2017-07-05 HISTORY — DX: Pulmonary hypertension, unspecified: I27.20

## 2017-07-05 HISTORY — DX: Epilepsy, unspecified, not intractable, without status epilepticus: G40.909

## 2017-07-05 LAB — GLUCOSE, CAPILLARY: Glucose-Capillary: 165 mg/dL — ABNORMAL HIGH (ref 65–99)

## 2017-07-05 MED ORDER — RIVAROXABAN 20 MG PO TABS: 20.0000 mg | ORAL_TABLET | Freq: Every day | ORAL | 0 refills | Status: DC

## 2017-07-05 MED ORDER — SPIRONOLACTONE 25 MG PO TABS: 50.0000 mg | ORAL_TABLET | Freq: Every day | ORAL | Status: DC

## 2017-07-05 MED ORDER — LEVETIRACETAM 750 MG PO TABS: 750.0000 mg | ORAL_TABLET | Freq: Two times a day (BID) | ORAL | 0 refills | Status: DC

## 2017-07-05 MED ORDER — QUETIAPINE FUMARATE 25 MG PO TABS: 25.0000 mg | ORAL_TABLET | Freq: Every day | ORAL | 0 refills | Status: DC

## 2017-07-05 NOTE — Discharge Instructions (Signed)
Inpatient Rehab Discharge Instructions  Amanda Short Discharge date and time:  07/05/17  Activities/Precautions/ Functional Status: Activity: no lifting, driving, or strenuous exercise  till cleared by MD. NO driving for 6 months or till cleared by Neurology Diet: diabetic diet Wound Care: keep wound clean and dry   Functional status:  ___ No restrictions     ___ Walk up steps independently _X__ 24/7 supervision/assistance   ___ Walk up steps with assistance ___ Intermittent supervision/assistance  ___ Bathe/dress independently ___ Walk with walker     _X__ Bathe/dress with assistance ___ Walk Independently    ___ Shower independently ___ Walk with assistance    ___ Shower with assistance _X__ No alcohol     ___ Return to work/school ________    COMMUNITY REFERRALS UPON DISCHARGE:    Home Health:   PT     OT    ST   RN                     Agency:  [facility] Phone: [phone]   Medical Equipment/Items Ordered:  Youth rolling walker and tub bench                                                      Agency/Supplier:  [employer] @ [phone]   Special Instructions: 1. Use CPAP nights Need to use oxygen with activity and at sleep--can remove for short periods when at rest.  2. Use flutter valve 3-4 times a day.  3. You need to get a primary MD in [place]. Call your doctor in [place] for post hospital follow up in the next 1-2 weeks.   My questions have been answered and I understand these instructions. I will adhere to these goals and the provided educational materials after my discharge from the hospital.  Patient/Caregiver Signature _______________________________ Date __________  Clinician Signature _______________________________________ Date __________  Please bring this form and your medication list with you to all your follow-up doctor's appointments.      Information on my medicine - XARELTO (rivaroxaban)  This medication education was  reviewed with me or my healthcare representative as part of my discharge preparation.  The pharmacist that spoke with me during my hospital stay was:  Jens Som, RPH  WHY WAS XARELTO PRESCRIBED FOR YOU? Xarelto was prescribed to treat blood clots that may have been found in the veins of your legs (deep vein thrombosis) or in your lungs (pulmonary embolism) and to reduce the risk of them occurring again.  What do you need to know about Xarelto? The dose is one 20 mg tablet taken ONCE A DAY with your evening meal.  DO NOT stop taking Xarelto without talking to the health care provider who prescribed the medication.  Refill your prescription for 20 mg tablets before you run out.  After discharge, you should have regular check-up appointments with your healthcare provider that is prescribing your Xarelto.  In the future your dose may need to be changed if your kidney function changes by a significant amount.  What do you do if you miss a dose? If you are taking Xarelto TWICE DAILY and you miss a dose, take it as soon as you remember. You may take two 15 mg tablets (total 30 mg) at the same time then resume your regularly  scheduled 15 mg twice daily the next day.  If you are taking Xarelto ONCE DAILY and you miss a dose, take it as soon as you remember on the same day then continue your regularly scheduled once daily regimen the next day. Do not take two doses of Xarelto at the same time.   Important Safety Information Xarelto is a blood thinner medicine that can cause bleeding. You should call your healthcare provider right away if you experience any of the following: ? Bleeding from an injury or your nose that does not stop. ? Unusual colored urine (red or dark brown) or unusual colored stools (red or black). ? Unusual bruising for unknown reasons. ? A serious fall or if you hit your head (even if there is no bleeding).  Some medicines may interact with Xarelto and might increase  your risk of bleeding while on Xarelto. To help avoid this, consult your healthcare provider or pharmacist prior to using any new prescription or non-prescription medications, including herbals, vitamins, non-steroidal anti-inflammatory drugs (NSAIDs) and supplements.  This website has more information on Xarelto: [email or web address]

## 2017-07-05 NOTE — Discharge Summary (Cosign Needed Addendum)
Physician Discharge Summary  Patient ID: Amanda Short MRN: [id number] DOB/AGE: [birth year] 76 y.o.  Admit date: 06/23/2017 Discharge date: 07/05/2017  Discharge Diagnoses:  Principal Problem:   Physical debility Active Problems:   Aspiration pneumonia ([place])   Diabetes mellitus type 2 in nonobese [facility])   History of pulmonary embolism   History of acute respiratory failure   Status post trachelectomy   Anxiety state   Seizure disorder ([place])   Pulmonary HTN ([place])   Discharged Condition: stable   Significant Diagnostic Studies:  Dg Chest 2 View  Result Date: 06/27/2017 CLINICAL DATA:  Cough for a few days.  History of tracheotomy. EXAM: CHEST  2 VIEW COMPARISON:  06/14/2017 FINDINGS: Small bilateral pleural effusion. No superimposed airspace opacity or edema. Chronic cardiomegaly. Calcified granuloma over the right lower lung. A tracheostomy tube remains well seated. Feeding tube and IJ catheter have been removed. 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 IMPRESSION: Small layering pleural effusions.  No noted pneumonia or edema. Electronically Signed   By: Monte Fantasia M.D.   On: 06/27/2017 11:12    Labs:  Basic Metabolic Panel: BMP Latest Ref Rng & Units 07/03/2017 06/26/2017 06/24/2017  Glucose 65 - 99 mg/dL 214(H) 171(H) 174(H)  BUN 6 - 20 mg/dL 10 9 5(L)  Creatinine 0.44 - 1.00 mg/dL 0.84 0.85 0.86  Sodium 135 - 145 mmol/L 140 138 140  Potassium 3.5 - 5.1 mmol/L 3.7 4.0 4.2  Chloride 101 - 111 mmol/L 103 96(L) 96(L)  CO2 22 - 32 mmol/L 30 34(H) 36(H)  Calcium 8.9 - 10.3 mg/dL 8.3(L) 9.1 9.2    CBC: CBC Latest Ref Rng & Units 07/03/2017 06/26/2017 06/24/2017  WBC 4.0 - 10.5 K/uL 5.3 7.4 8.7  Hemoglobin 12.0 - 15.0 g/dL 10.7(L) 10.7(L) 10.9(L)  Hematocrit 36.0 - 46.0 % 34.4(L) 35.1(L) 35.5(L)  Platelets 150 - 400 K/uL 240 354 356    CBG: Recent Labs  Lab 07/04/17 0638 07/04/17 1158 07/04/17 1628 07/04/17 2130  07/05/17 0642  GLUCAP 155* 122* 120* 145* 165*    Brief HPI:   Amanda Harkleroadis a 76 y.o.femalewith history of HTN, T2DM, h/o PE, recent bout with diverticulitis 11/11to 05/30/17; who was admitted on 06/06/17 with AMS with weakness left side and left gaze deviation. She had seizure activity in Xray with difficulty protecting airway and was intubated. She was loaded with keppra and required multiple doses IV ativan due to status epilepticus.  Hospital course significant for agitation requiring precedex as well as restraints, inability to tolerate extubation requiring emergent tracheostomy at bedside due to stridor/marked swelling of VC on 06/12/17. She was stated on IV antibiotics for aspiration PNA and was weaned to ATC.  Trach downsizing was held due to higher oxygen needs of 60% Fio2 at nights.  She continued to exhibit cognitive deficits with decreased balance and hypoxia affecting activity. CIR was recommended due to functional deficits.    Hospital Course: Amanda Short was admitted to rehab 76/02/2017 for inpatient therapies to consist of PT, ST and OT at least three hours five days a week. Past admission physiatrist, therapy team and rehab RN have worked together to provide customized collaborative inpatient rehab. Blood pressures have been controlled. Diabetes has been monitored with ac/hs cbg checks and metformin was resumed with improvement. Her respiratory status has improved and she was decannulated by PCCM.  She has been educated on using oxygen with all activity as well as CPAP with oxygen at nights. She has been seizure free on Keppra bid  Urine culture done revealing 50,000 colonies Klebsiella and was treated with 3 day course of Cipro. Her endurance levels have improved and she has progressed to supervision level by discharge. She will continue to receive follow up [place], [place], [place] and [place] by [place] after discharge.    Rehab course: During patient's stay in rehab  weekly team conferences were held to monitor patient's progress, set goals and discuss barriers to discharge. At admission, patient required max assist with basic self care needs and min assist with mobility. She requires supervision to don PMSV and exhibited deficits in safety with self care tasks as well as problems with  recall.   Patient has had improvement in activity tolerance, balance, postural control, as well as ability to compensate for deficits.  She is able to complete ADL tasks with supervision. She requires supervision for transfers and to ambulate 160' with RW. She is tolerating regular diet without difficulty. Her cognition has improved with MOCA score 27/30. She requires min cues for complex problem solving. Family education was completed with husband.     Disposition: 76-Rehab Facility  Diet: Diabetic diet. Dictation #1 [id number]  [id number]   Special Instructions: 1. Need to use flutter valve at least qid.  2. Check blood sugars ac/hs and can increase to home dose if BS are running > 120.  3. Need to set up with primary MD in [place].    Discharge Instructions    Ambulatory referral to Neurology   Complete by:  As directed    An appointment is requested in approximately: 2 weeks. Follow up for new onset of seizures.   Ambulatory referral to Physical Medicine Rehab   Complete by:  As directed    1-2 weeks transitional care appt     Allergies as of 07/05/2017      Reactions   Allopurinol Nausea And Vomiting      Medication List    TAKE these medications   calcium-vitamin D 500-200 MG-UNIT tablet Commonly known as:  OSCAL 500/200 D-3 Take 1 tablet 2 (two) times daily by mouth.   DULoxetine 60 MG capsule Commonly known as:  CYMBALTA Take 60 mg by mouth daily.   levETIRAcetam 750 MG tablet Commonly known as:  KEPPRA Take 1 tablet (750 mg total) by mouth 2 (two) times daily.   levothyroxine 100 MCG tablet Commonly known as:  SYNTHROID,  LEVOTHROID Take 100 mcg by mouth daily before breakfast.   metFORMIN 500 MG tablet Commonly known as:  GLUCOPHAGE Take 500 mg by mouth 2 (two) times daily with a meal.   metoprolol 200 MG 24 hr tablet Commonly known as:  TOPROL-XL Take 100 mg by mouth at bedtime.   montelukast 10 MG tablet Commonly known as:  SINGULAIR Take 10 mg at bedtime by mouth.   multivitamin with minerals Tabs tablet Take 1 tablet daily by mouth.   omeprazole 40 MG capsule Commonly known as:  PRILOSEC Take 40 mg by mouth 2 (two) times daily.   QUEtiapine 25 MG tablet Commonly known as:  SEROQUEL Take 1 tablet (25 mg total) by mouth at bedtime.   rivaroxaban 20 MG Tabs tablet Commonly known as:  XARELTO Take 1 tablet (20 mg total) by mouth daily with supper.   spironolactone 25 MG tablet Commonly known as:  ALDACTONE Take 2 tablets (50 mg total) by mouth daily. What changed:  medication strength   ULORIC 40 MG tablet Generic drug:  febuxostat Take 40 mg daily by mouth.  Follow-up Information    Meredith Staggers, MD Follow up.   Specialty:  Physical Medicine and Rehabilitation Why:  Office will call you with follow up appointment Contact information: [address] [name] [address] [phone]        Tanda Rockers, MD Follow up on 07/31/2017.   Specialty:  Pulmonary Disease Why:  Be there at 9:30 am for hospital follow up.  Contact information: [address] [name] [address] [phone]           Signed: Bary Leriche 07/05/2017, 9:18 AM

## 2017-07-05 NOTE — Progress Notes (Signed)
Subjective/Complaints: States she had another good night of sleep.  No breathing issues reported.  Has not heard air leaking through the dressing ROS: pt denies nausea, vomiting, diarrhea, cough, shortness of breath or chest pain   Objective: Vital Signs: Blood pressure (!) 128/53, pulse 65, temperature 98.1 F (36.7 C), temperature source Oral, resp. rate 18, height _0  (1.549 m), weight 71.9 kg (158 lb 9.6 oz), SpO2 96 %. No results found. Results for orders placed or performed during the hospital encounter of 06/23/17 (from the past 72 hour(s))  Glucose, capillary     Status: Abnormal   Collection Time: 07/02/17 12:01 PM  Result Value Ref Range   Glucose-Capillary 156 (H) 65 - 99 mg/dL  Glucose, capillary     Status: Abnormal   Collection Time: 07/02/17  4:38 PM  Result Value Ref Range   Glucose-Capillary 111 (H) 65 - 99 mg/dL  Glucose, capillary     Status: Abnormal   Collection Time: 07/02/17  9:15 PM  Result Value Ref Range   Glucose-Capillary 173 (H) 65 - 99 mg/dL   Comment 1 Notify RN   Glucose, capillary     Status: Abnormal   Collection Time: 07/03/17  7:33 AM  Result Value Ref Range   Glucose-Capillary 168 (H) 65 - 99 mg/dL   Comment 1 Notify RN   Basic metabolic panel     Status: Abnormal   Collection Time: 07/03/17  8:04 AM  Result Value Ref Range   Sodium 140 135 - 145 mmol/L   Potassium 3.7 3.5 - 5.1 mmol/L   Chloride 103 101 - 111 mmol/L   CO2 30 22 - 32 mmol/L   Glucose, Bld 214 (H) 65 - 99 mg/dL   BUN 10 6 - 20 mg/dL   Creatinine, Ser 0.84 0.44 - 1.00 mg/dL   Calcium 8.3 (L) 8.9 - 10.3 mg/dL   GFR calc non Af Amer >60 >60 mL/min   GFR calc Af Amer >60 >60 mL/min    Comment: (NOTE) The eGFR has been calculated using the CKD EPI equation. This calculation has not been validated in all clinical situations. eGFR's persistently <60 mL/min signify possible Chronic Kidney Disease.    Anion gap 7 5 - 15  CBC     Status: Abnormal   Collection Time:  07/03/17  8:04 AM  Result Value Ref Range   WBC 5.3 4.0 - 10.5 K/uL   RBC 3.43 (L) 3.87 - 5.11 MIL/uL   Hemoglobin 10.7 (L) 12.0 - 15.0 g/dL   HCT 34.4 (L) 36.0 - 46.0 %   MCV 100.3 (H) 78.0 - 100.0 fL   MCH 31.2 26.0 - 34.0 pg   MCHC 31.1 30.0 - 36.0 g/dL   RDW 14.4 11.5 - 15.5 %   Platelets 240 150 - 400 K/uL  Glucose, capillary     Status: Abnormal   Collection Time: 07/03/17 12:16 PM  Result Value Ref Range   Glucose-Capillary 141 (H) 65 - 99 mg/dL  Glucose, capillary     Status: Abnormal   Collection Time: 07/03/17  4:32 PM  Result Value Ref Range   Glucose-Capillary 144 (H) 65 - 99 mg/dL  Glucose, capillary     Status: Abnormal   Collection Time: 07/03/17  8:54 PM  Result Value Ref Range   Glucose-Capillary 172 (H) 65 - 99 mg/dL  Glucose, capillary     Status: Abnormal   Collection Time: 07/04/17  6:38 AM  Result Value Ref Range   Glucose-Capillary 155 (H)  65 - 99 mg/dL   Comment 1 Notify RN   Glucose, capillary     Status: Abnormal   Collection Time: 07/04/17 11:58 AM  Result Value Ref Range   Glucose-Capillary 122 (H) 65 - 99 mg/dL  Glucose, capillary     Status: Abnormal   Collection Time: 07/04/17  4:28 PM  Result Value Ref Range   Glucose-Capillary 120 (H) 65 - 99 mg/dL  Glucose, capillary     Status: Abnormal   Collection Time: 07/04/17  9:30 PM  Result Value Ref Range   Glucose-Capillary 145 (H) 65 - 99 mg/dL   Comment 1 Notify RN   Glucose, capillary     Status: Abnormal   Collection Time: 07/05/17  6:42 AM  Result Value Ref Range   Glucose-Capillary 165 (H) 65 - 99 mg/dL     HEENT: No leakage through the dressing on exam but when I removed the dressing there is audible air movement through stoma Cardio: RRR without murmur. No JVD        Resp: CTA Bilaterally without wheezes or rales. Normal effort  GI: BS positive and NT, ND Extremity:   T trace lower extremity edema Skin:   Intact and Other trach site as above Neuro: Alert/Oriented, Normal Sensory  and Abnormal Motor 4/5 in BUE and BLE Musc/Skel:  Normal Psych: Calm and pleasant   Assessment/Plan: 1. Functional deficits secondary to Debilty after VDRF which require 3+ hours per day of interdisciplinary therapy in a comprehensive inpatient rehab setting. Physiatrist is providing close team supervision and 24 hour management of active medical problems listed below. Physiatrist and rehab team continue to assess barriers to discharge/monitor patient progress toward functional and medical goals. FIM: Function - Bathing Position: Shower Body parts bathed by patient: Right arm, Left arm, Chest, Abdomen, Right upper leg, Left upper leg, Right lower leg, Left lower leg, Buttocks, Front perineal area Body parts bathed by helper: Back Bathing not applicable: Front perineal area, Buttocks Assist Level: Supervision or verbal cues  Function- Upper Body Dressing/Undressing What is the patient wearing?: Pull over shirt/dress Bra - Perfomed by helper: Thread/unthread right bra strap, Thread/unthread left bra strap, Hook/unhook bra (pull down sports bra) Pull over shirt/dress - Perfomed by patient: Thread/unthread right sleeve, Pull shirt over trunk, Put head through opening, Thread/unthread left sleeve Pull over shirt/dress - Perfomed by helper: Put head through opening Assist Level: Supervision or verbal cues Set up : To obtain clothing/put away Function - Lower Body Dressing/Undressing What is the patient wearing?: Pants, Non-skid slipper socks, Underwear Position: Wheelchair/chair at sink Underwear - Performed by patient: Thread/unthread right underwear leg, Thread/unthread left underwear leg, Pull underwear up/down Underwear - Performed by helper: Pull underwear up/down Pants- Performed by patient: Thread/unthread right pants leg, Thread/unthread left pants leg, Pull pants up/down Pants- Performed by helper: Thread/unthread left pants leg Non-skid slipper socks- Performed by patient: Don/doff  right sock, Don/doff left sock Non-skid slipper socks- Performed by helper: Don/doff right sock, Don/doff left sock Assist for footwear: Supervision/touching assist Assist for lower body dressing: Supervision or verbal cues  Function - Toileting Toileting steps completed by patient: Adjust clothing prior to toileting, Performs perineal hygiene, Adjust clothing after toileting Toileting steps completed by helper: Adjust clothing after toileting Toileting Assistive Devices: Grab bar or rail Assist level: Supervision or verbal cues  Function - Air cabin crew transfer assistive device: Walker, Grab bar Assist level to toilet: Supervision or verbal cues Assist level from toilet: Supervision or verbal cues Assist level to  bedside commode (at bedside): Touching or steadying assistance (Pt > 75%) Assist level from bedside commode (at bedside): Touching or steadying assistance (Pt > 75%)  Function - Chair/bed transfer Chair/bed transfer method: Stand pivot Chair/bed transfer assist level: Supervision or verbal cues Chair/bed transfer assistive device: Armrests, Bedrails, Walker Chair/bed transfer details: Verbal cues for safe use of DME/AE, Verbal cues for technique  Function - Locomotion: Wheelchair Will patient use wheelchair at discharge?: No Type: Manual Max wheelchair distance: 120 Assist Level: Dependent (Pt equals 0%) Assist Level: Dependent (Pt equals 0%) Wheel 150 feet activity did not occur: Safety/medical concerns Turns around,maneuvers to table,bed, and toilet,negotiates 3% grade,maneuvers on rugs and over doorsills: No Function - Locomotion: Ambulation Assistive device: Walker-rolling Max distance: 150 Assist level: Touching or steadying assistance (Pt > 75%) Assist level: Supervision or verbal cues Walk 50 feet with 2 turns activity did not occur: Safety/medical concerns Assist level: Touching or steadying assistance (Pt > 75%) Walk 150 feet activity did not  occur: Safety/medical concerns Assist level: Touching or steadying assistance (Pt > 75%) Walk 10 feet on uneven surfaces activity did not occur: Safety/medical concerns  Function - Comprehension Comprehension: Auditory Comprehension assist level: Follows basic conversation/direction with no assist  Function - Expression Expression: Verbal Expression assistive device: Talk trach valve Expression assist level: Expresses basic needs/ideas: With no assist  Function - Social Interaction Social Interaction assist level: Interacts appropriately with others - No medications needed.  Function - Problem Solving Problem solving assist level: Solves basic 90% of the time/requires cueing < 10% of the time  Function - Memory Memory assist level: Recognizes or recalls 75 - 89% of the time/requires cueing 10 - 24% of the time Patient normally able to recall (first 3 days only): Current season, Location of own room, Staff names and faces, That he or she is in a hospital  Medical Problem List and Plan: 1. Debility secondary to VDRF          -DC home today   -  patient follow-up with me in 2-4 weeks at the office 2. DVT Prophylaxis/Anticoagulation: Pharmaceutical: Xarelto 3. Pain Management: acetaminophen 4. Mood/anxiety:  Continued ego support fRom team for anxiety    -  Xanax   0.25 every 8 hours as needed    -  low-dose Seroquel to assist with sleep 25 mg at 9 PM nightly    -can continue at home and transition to PRN     5. Neuropsych: This patient is capable of making decisions on her own behalf. 6. Skin/Wound Care: Prophyllactic foam dressing to sacrum 7. Fluids/Electrolytes/Nutrition: carb mod diet 8. 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 Change to po, no seizures since rehab admit 9. VDRF/respiratory: Continue trach dressing 10. 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  11. 50k klebsiella in urine:  cipro 25m bid x 3 days--completed   -Having some associated yeast as well.  Gave  Diflucan x2 doses 12. DM2: Resumed metformin 5023mbid  LOS (Days) 12 A FACE TO FACE EVALUATION WAS PERFORMED  Haide Klinker T 07/05/2017, 10:05 AM

## 2017-07-05 NOTE — Progress Notes (Signed)
07/05/17 1013 nursing Patient discharged to home per wheelchair accompanied by NT and husband. Discharge instructions done by [facility] PA. Respiratory came to instruct and give flutter valve. Shower bench deliivered.

## 2017-07-05 NOTE — Plan of Care (Signed)
Pt having normal b/b function Pt denies any pain Anxiety controlled with Xanax

## 2017-07-05 NOTE — Progress Notes (Signed)
Social Work  Discharge Note  The overall goal for the admission was met for:   Discharge location: Yes - home with spouse who can provide 24/7 assistance.  Length of Stay: Yes - 12 days  Discharge activity level: Yes - supervision overall  Home/community participation: Yes  Services provided included: MD, RD, PT, OT, SLP, RN, TR, Pharmacy, [place]:  [facility] Medicare  Follow-up services arranged: Home Health: RN, PT, OT, ST via [place], DME: youth rolling walker and tub bench via [facility] and Patient/Family request agency HH: [facility], DME: [facility]  Comments (or additional information):  Patient/Family verbalized understanding of follow-up arrangements: Yes  Individual responsible for coordination of the follow-up plan: pt  Confirmed correct DME delivered: Mariel Lukins 07/05/2017    Sair Faulcon

## 2017-07-08 ENCOUNTER — Other Ambulatory Visit: Payer: Self-pay

## 2017-07-08 ENCOUNTER — Encounter (HOSPITAL_COMMUNITY): Payer: Self-pay | Admitting: Oncology

## 2017-07-08 ENCOUNTER — Emergency Department (HOSPITAL_COMMUNITY)
Admission: EM | Admit: 2017-07-08 | Discharge: 2017-07-09 | Disposition: A | Discharge status: Home / Self Care | Payer: Medicare Other | Source: Home / Self Care | Attending: Emergency Medicine | Admitting: Emergency Medicine

## 2017-07-08 DIAGNOSIS — E119 Type 2 diabetes mellitus without complications: Secondary | ICD-10-CM

## 2017-07-08 DIAGNOSIS — Z79899 Other long term (current) drug therapy: Secondary | ICD-10-CM | POA: Insufficient documentation

## 2017-07-08 DIAGNOSIS — Z96642 Presence of left artificial hip joint: Secondary | ICD-10-CM | POA: Insufficient documentation

## 2017-07-08 DIAGNOSIS — I10 Essential (primary) hypertension: Secondary | ICD-10-CM | POA: Insufficient documentation

## 2017-07-08 DIAGNOSIS — R112 Nausea with vomiting, unspecified: Secondary | ICD-10-CM

## 2017-07-08 DIAGNOSIS — Z7901 Long term (current) use of anticoagulants: Secondary | ICD-10-CM | POA: Insufficient documentation

## 2017-07-08 DIAGNOSIS — E039 Hypothyroidism, unspecified: Secondary | ICD-10-CM | POA: Insufficient documentation

## 2017-07-08 DIAGNOSIS — Z96653 Presence of artificial knee joint, bilateral: Secondary | ICD-10-CM | POA: Insufficient documentation

## 2017-07-08 DIAGNOSIS — E860 Dehydration: Secondary | ICD-10-CM

## 2017-07-08 DIAGNOSIS — Z853 Personal history of malignant neoplasm of breast: Secondary | ICD-10-CM

## 2017-07-08 DIAGNOSIS — Z87891 Personal history of nicotine dependence: Secondary | ICD-10-CM | POA: Insufficient documentation

## 2017-07-08 NOTE — ED Triage Notes (Signed)
Pt bib [facility] from home d/t N/V.  Pt released from hospital on 07/04/17 after a one month stay.  Pt also endorsed feeling weak and occasionally shob.  Pt is A&O x 4.  Pt is on chronic O2 @ 2L.

## 2017-07-09 ENCOUNTER — Emergency Department (HOSPITAL_COMMUNITY): Payer: Medicare Other

## 2017-07-09 LAB — CBC WITH DIFFERENTIAL/PLATELET
BASOS ABS: 0 10*3/uL (ref 0.0–0.1)
Basophils Relative: 0 %
EOS PCT: 1 %
Eosinophils Absolute: 0.1 10*3/uL (ref 0.0–0.7)
HEMATOCRIT: 36.5 % (ref 36.0–46.0)
Hemoglobin: 11.9 g/dL — ABNORMAL LOW (ref 12.0–15.0)
LYMPHS ABS: 1.4 10*3/uL (ref 0.7–4.0)
LYMPHS PCT: 15 %
MCH: 31.2 pg (ref 26.0–34.0)
MCHC: 32.6 g/dL (ref 30.0–36.0)
MCV: 95.8 fL (ref 78.0–100.0)
MONO ABS: 0.6 10*3/uL (ref 0.1–1.0)
Monocytes Relative: 6 %
NEUTROS ABS: 7.2 10*3/uL (ref 1.7–7.7)
Neutrophils Relative %: 78 %
PLATELETS: 227 10*3/uL (ref 150–400)
RBC: 3.81 MIL/uL — AB (ref 3.87–5.11)
RDW: 13.9 % (ref 11.5–15.5)
WBC: 9.2 10*3/uL (ref 4.0–10.5)

## 2017-07-09 LAB — URINALYSIS, ROUTINE W REFLEX MICROSCOPIC
Bacteria, UA: NONE SEEN
Bilirubin Urine: NEGATIVE
GLUCOSE, UA: NEGATIVE mg/dL
Hgb urine dipstick: NEGATIVE
Ketones, ur: NEGATIVE mg/dL
Leukocytes, UA: NEGATIVE
NITRITE: NEGATIVE
PH: 5 (ref 5.0–8.0)
Protein, ur: 30 mg/dL — AB
SPECIFIC GRAVITY, URINE: 1.025 (ref 1.005–1.030)

## 2017-07-09 LAB — COMPREHENSIVE METABOLIC PANEL
ALT: 10 U/L — AB (ref 14–54)
AST: 15 U/L (ref 15–41)
Albumin: 3.6 g/dL (ref 3.5–5.0)
Alkaline Phosphatase: 92 U/L (ref 38–126)
Anion gap: 15 (ref 5–15)
BILIRUBIN TOTAL: 0.8 mg/dL (ref 0.3–1.2)
BUN: 15 mg/dL (ref 6–20)
CHLORIDE: 95 mmol/L — AB (ref 101–111)
CO2: 29 mmol/L (ref 22–32)
CREATININE: 1.02 mg/dL — AB (ref 0.44–1.00)
Calcium: 7.5 mg/dL — ABNORMAL LOW (ref 8.9–10.3)
GFR, EST NON AFRICAN AMERICAN: 52 mL/min — AB (ref >60)
Glucose, Bld: 213 mg/dL — ABNORMAL HIGH (ref 65–99)
Potassium: 3.9 mmol/L (ref 3.5–5.1)
Sodium: 139 mmol/L (ref 135–145)
TOTAL PROTEIN: 6.3 g/dL — AB (ref 6.5–8.1)

## 2017-07-09 LAB — TROPONIN I: Troponin I: <0.03 ng/mL (ref <0.03)

## 2017-07-09 MED ORDER — SODIUM CHLORIDE 0.9 % IV BOLUS (SEPSIS)
1000.0000 mL | Freq: Once | INTRAVENOUS | Status: AC
  Administered 2017-07-09: 1000 mL via INTRAVENOUS

## 2017-07-09 MED ORDER — ONDANSETRON 4 MG PO TBDP: 4.0000 mg | ORAL_TABLET | Freq: Three times a day (TID) | ORAL | 0 refills | Status: DC | PRN

## 2017-07-09 MED ORDER — ONDANSETRON HCL 4 MG/2ML IJ SOLN
4.0000 mg | Freq: Once | INTRAMUSCULAR | Status: AC
  Administered 2017-07-09: 4 mg via INTRAVENOUS
  Filled 2017-07-09: qty 2

## 2017-07-09 NOTE — ED Provider Notes (Signed)
[facility] EMERGENCY DEPARTMENT Provider Note   CSN: [id number] Arrival date & time: 07/08/17  2337     History   Chief Complaint Chief Complaint  Patient presents with  . Nausea    HPI Amanda Short is a 76 y.o. female.  HPI  This is a 76 year old female with recent complicated past medical history and prolonged hospitalization after status epilepticus who presents with nausea and vomiting.  Patient reports that she was discharged home from the hospital 3 days ago.  She had been in rehabilitation and is due to start home physical therapy and occupational therapy.  States that since she has been at home she developed diarrhea and vomiting.  Diarrhea has improved but she has had persistent vomiting with all oral intake.  She denies any abdominal pain.  She does report occasional abdominal cramping.  Recent antibiotics for a UTI.  She denies any fevers.  Describes emesis is nonbloody, nonbilious.  Past Medical History:  Diagnosis Date  . Arthritis    "back, hands" (07/22/2015)  . Cancer of left breast ([place]) 2006   S/P lumpectomy  . Complication of anesthesia    "brief breathing problem at surgery center in ~ 2005 when I had gallbladder OR"  . Concussion 03/2013   due to fall  . Depression   . DVT (deep venous thrombosis) ([place]) 03/2013   LLE  . GERD (gastroesophageal reflux disease)   . Hyperlipidemia   . Hypertension   . Hypothyroidism   . OSA treated with BiPAP   . Pulmonary embolism ([place]) 03/2013  . Thyroid disease    Hypothyroid  . Type II diabetes mellitus [facility])     Patient Active Problem List   Diagnosis Date Noted  . Seizure disorder ([place]) 07/05/2017  . Pulmonary HTN ([place]) 07/05/2017  . Anxiety state   . Physical debility 06/23/2017  . 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 History of acute respiratory failure   . Tracheostomy, acute management ([place])   . Aspiration pneumonia ([place])   . Encephalopathy   . Status epilepticus ([place])   .  Benign essential HTN   . 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 History of pulmonary embolism   . Acute blood loss anemia   . Diverticulitis of colon   . Tachypnea   . Encounter for orogastric (OG) tube placement   . Encounter for central line placement   . Encounter for attention to tracheostomy ([place])   . Acute respiratory failure with hypoxia ([place])   . Acute encephalopathy 06/06/2017  . Diverticulitis of large intestine without perforation or abscess without bleeding   . Abdominal pain   . Non-intractable cyclical vomiting with nausea   . Abnormal CT of the abdomen   . Hypocalcemia 05/27/2017  . 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 Colitis 05/27/2017  . Diabetic foot infection ([place]) 07/21/2015  . Diabetes mellitus type 2, controlled ([place]) 07/21/2015  . Cellulitis of foot, right 07/21/2015  . Cellulitis of right foot 07/21/2015  . Acute pulmonary embolism ([place]) 04/01/2013  . DVT (deep venous thrombosis) ([place]) 04/01/2013  . HX: breast cancer 04/01/2013  . Concussion 04/01/2013  . Diabetes ([place]) 04/01/2013  . Essential hypertension, benign 04/01/2013  . Unspecified hypothyroidism 04/01/2013  . OSA on CPAP 04/01/2013    Past Surgical History:  Procedure Laterality Date  . ABDOMINAL HYSTERECTOMY  1970s  . BREAST BIOPSY Left 2006  . BREAST LUMPECTOMY Left 2006  . BUNIONECTOMY WITH HAMMERTOE RECONSTRUCTION Left   .  ESOPHAGOGASTRODUODENOSCOPY N/A 05/29/2017   Procedure: ESOPHAGOGASTRODUODENOSCOPY (EGD);  Surgeon: Irene Shipper, MD;  Location: [facility];  Service: Endoscopy;  Laterality: N/A;  . JOINT REPLACEMENT    . LAPAROSCOPIC CHOLECYSTECTOMY  ~ 2005  . TOTAL HIP ARTHROPLASTY Left 2010  . TOTAL KNEE ARTHROPLASTY Bilateral 2005-2006    OB History    No data available       Home Medications    Prior to Admission medications   Medication Sig Start Date End Date Taking? Authorizing Provider  calcium-vitamin D (OSCAL 500/200 D-3) 500-200 MG-UNIT tablet Take 1  tablet 2 (two) times daily by mouth. 05/30/17 06/09/18  Barton Dubois, MD  DULoxetine (CYMBALTA) 60 MG capsule Take 60 mg by mouth daily.    [provider]  febuxostat (ULORIC) 40 MG tablet Take 40 mg daily by mouth.    [provider]  levETIRAcetam (KEPPRA) 750 MG tablet Take 1 tablet (750 mg total) by mouth 2 (two) times daily. 07/05/17   Love, Ivan Anchors, PA-C  levothyroxine (SYNTHROID, LEVOTHROID) 100 MCG tablet Take 100 mcg by mouth daily before breakfast.    [provider]  metFORMIN (GLUCOPHAGE) 500 MG tablet Take 500 mg by mouth 2 (two) times daily with a meal.     [provider]  metoprolol (TOPROL-XL) 200 MG 24 hr tablet Take 100 mg by mouth at bedtime.     [provider]  montelukast (SINGULAIR) 10 MG tablet Take 10 mg at bedtime by mouth.    [provider]  Multiple Vitamin (MULTIVITAMIN WITH MINERALS) TABS tablet Take 1 tablet daily by mouth.    [provider]  omeprazole (PRILOSEC) 40 MG capsule Take 40 mg by mouth 2 (two) times daily.    [provider]  ondansetron (ZOFRAN ODT) 4 MG disintegrating tablet Take 1 tablet (4 mg total) by mouth every 8 (eight) hours as needed for nausea or vomiting. 07/09/17   Mahati Vajda, Barbette Hair, MD  QUEtiapine (SEROQUEL) 25 MG tablet Take 1 tablet (25 mg total) by mouth at bedtime. 07/05/17   Love, Ivan Anchors, PA-C  rivaroxaban (XARELTO) 20 MG TABS tablet Take 1 tablet (20 mg total) by mouth daily with supper. 07/05/17   Love, Ivan Anchors, PA-C  spironolactone (ALDACTONE) 25 MG tablet Take 2 tablets (50 mg total) by mouth daily. 07/05/17   Bary Leriche, PA-C    Family History Family History  Problem Relation Age of Onset  . Diabetes Unknown   . Diabetes Mellitus II Sister     Social History Social History   Tobacco Use  . Smoking status: Former Smoker    Packs/day: 1.00    Years: 30.00    Pack years: 30.00    Types: Cigarettes  . Smokeless tobacco: Never Used  .  Tobacco comment: "quit smoking cigarettes in the 1990s"  Substance Use Topics  . Alcohol use: No  . Drug use: No     Allergies   Allopurinol   Review of Systems Review of Systems  Constitutional: Negative for fever.  Respiratory: Negative for shortness of breath.   Cardiovascular: Negative for chest pain.  Gastrointestinal: Positive for diarrhea, nausea and vomiting. Negative for abdominal pain and constipation.  Genitourinary: Negative for dysuria.  All other systems reviewed and are negative.    Physical Exam Updated Vital Signs BP 139/60   Pulse 63   Temp (!) 97.5 F (36.4 C) (Oral)   Resp 17   SpO2 98%   Physical Exam  Constitutional: She is  oriented to person, place, and time.  Chronically ill-appearing, no acute distress, pale  HENT:  Head: Normocephalic and atraumatic.  Eyes: Pupils are equal, round, and reactive to light.  Neck:  Decannulated tracheostomy  Cardiovascular: Normal rate, regular rhythm and normal heart sounds.  Pulmonary/Chest: Effort normal. No respiratory distress. She has no wheezes.  Abdominal: Soft. There is no tenderness. There is no rebound and no guarding.  Abdomen soft, hyperactive bowel sounds, no tenderness  Neurological: She is alert and oriented to person, place, and time.  Skin: Skin is warm and dry.  Psychiatric: She has a normal mood and affect.  Nursing note and vitals reviewed.    ED Treatments / Results  Labs (all labs ordered are listed, but only abnormal results are displayed) Labs Reviewed  CBC WITH DIFFERENTIAL/PLATELET - Abnormal; Notable for the following components:      Result Value   RBC 3.81 (*)    Hemoglobin 11.9 (*)    All other components within normal limits  COMPREHENSIVE METABOLIC PANEL - Abnormal; Notable for the following components:   Chloride 95 (*)    Glucose, Bld 213 (*)    Creatinine, Ser 1.02 (*)    Calcium 7.5 (*)    Total Protein 6.3 (*)    ALT 10 (*)    GFR calc non Af Amer 52 (*)     All other components within normal limits  URINALYSIS, ROUTINE W REFLEX MICROSCOPIC - Abnormal; Notable for the following components:   Protein, ur 30 (*)    Squamous Epithelial / LPF 0-5 (*)    All other components within normal limits  TROPONIN I    EKG  EKG Interpretation  Date/Time:  Monday July 09 2017 02:06:12 EST Ventricular Rate:  66 PR Interval:    QRS Duration: 90 QT Interval:  490 QTC Calculation: 514 R Axis:   -53 Text Interpretation:  Sinus rhythm LAD, consider left anterior fascicular block Abnormal T, probable ischemia, widespread Prolonged QT interval T wave inversions new when compared to prior Confirmed by Thayer Jew [phone]) on 07/09/2017 2:14:05 AM       Radiology Dg Abdomen Acute W/chest  Result Date: 07/09/2017 CLINICAL DATA:  Nausea, vomiting and diarrhea. EXAM: DG ABDOMEN ACUTE W/ 1V CHEST COMPARISON:  Chest radiograph 06/27/2017 FINDINGS: Cardiomediastinal contours are unchanged. Nodular opacity in the right lower lung is unchanged. No focal airspace consolidation or pulmonary edema. No pleural effusion or pneumothorax. 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 There is no free intraperitoneal air. Bowel gas pattern is normal. There is right convex lumbar scoliosis. Left total hip arthroplasty visualized portion is normal. IMPRESSION: No acute abnormality of the abdomen or pelvis. Electronically Signed   By: Ulyses Jarred M.D.   On: 07/09/2017 01:00    Procedures Procedures (including critical care time)  Medications Ordered in ED Medications  sodium chloride 0.9 % bolus 1,000 mL (0 mLs Intravenous Stopped 07/09/17 0439)  ondansetron (ZOFRAN) injection 4 mg (4 mg Intravenous Given 07/09/17 0434)     Initial Impression / Assessment and Plan / ED Course  I have reviewed the triage vital signs and the nursing notes.  Pertinent labs & imaging results that were available during my care of the patient were reviewed by me and  considered in my medical decision making (see chart for details).     Patient presents with nausea, vomiting, diarrhea.  Diarrhea has improved but nausea vomiting has persisted.  She is overall nontoxic appearing on exam.  Vital signs are reassuring.  No signs or symptoms of peritonitis.  Abdomen is soft.  She has had a recent prolonged hospital course and would be at risk for both infectious component.  Obstruction is another potential etiology; however, abdominal exam is fairly benign.  Lab workup initiated.  Patient was given fluids.  QTC was slightly prolonged on EKG.  She was given 1 dose of Zofran.  Troponin, EKG, CMP are reassuring.  She does have some evidence of mild dehydration with a creatinine of 1.02.  Baseline 0.8.  Acute abdominal series shows no evidence of air-fluid levels to indicate obstruction.  On recheck, patient feels much better.  She is tolerating fluids without difficulty.  Suspect her symptoms may be viral in nature.  She has not had any diarrhea here to test.  Recommend supportive measures at home.  I discussed with the patient that I would send her home with a prescription for Zofran; however, given her QTC, would use this sparingly and only if having active emesis.  After history, exam, and medical workup I feel the patient has been appropriately medically screened and is safe for discharge home. Pertinent diagnoses were discussed with the patient. Patient was given return precautions.   Final Clinical Impressions(s) / ED Diagnoses   Final diagnoses:  Non-intractable vomiting with nausea, unspecified vomiting type  Dehydration    ED Discharge Orders        Ordered    ondansetron (ZOFRAN ODT) 4 MG disintegrating tablet  Every 8 hours PRN     07/09/17 0515       Merryl Hacker, MD 07/09/17 [phone]

## 2017-07-09 NOTE — Discharge Instructions (Signed)
You were seen today for nausea, vomiting, diarrhea.  This may be related to a viral illness or your recent hospitalization.  You will be given a short course of nausea medication.  Only take as needed.  Make sure that you are staying hydrated.

## 2017-07-10 ENCOUNTER — Inpatient Hospital Stay (HOSPITAL_COMMUNITY)
Admission: EM | Admit: 2017-07-10 | Discharge: 2017-07-12 | DRG: 101 | Disposition: A | Discharge status: Home / Self Care | Payer: Medicare Other | Attending: Internal Medicine | Admitting: Internal Medicine

## 2017-07-10 ENCOUNTER — Encounter (HOSPITAL_COMMUNITY): Payer: Self-pay | Admitting: Emergency Medicine

## 2017-07-10 ENCOUNTER — Emergency Department (HOSPITAL_COMMUNITY): Payer: Medicare Other

## 2017-07-10 ENCOUNTER — Other Ambulatory Visit: Payer: Self-pay

## 2017-07-10 ENCOUNTER — Emergency Department (HOSPITAL_COMMUNITY)
Admission: EM | Admit: 2017-07-10 | Discharge: 2017-07-10 | Disposition: A | Discharge status: Home / Self Care | Payer: Medicare Other | Source: Home / Self Care | Attending: Emergency Medicine | Admitting: Emergency Medicine

## 2017-07-10 DIAGNOSIS — Z7984 Long term (current) use of oral hypoglycemic drugs: Secondary | ICD-10-CM

## 2017-07-10 DIAGNOSIS — R4701 Aphasia: Secondary | ICD-10-CM | POA: Diagnosis present

## 2017-07-10 DIAGNOSIS — Z853 Personal history of malignant neoplasm of breast: Secondary | ICD-10-CM

## 2017-07-10 DIAGNOSIS — Z79890 Hormone replacement therapy (postmenopausal): Secondary | ICD-10-CM

## 2017-07-10 DIAGNOSIS — G40901 Epilepsy, unspecified, not intractable, with status epilepticus: Secondary | ICD-10-CM | POA: Diagnosis present

## 2017-07-10 DIAGNOSIS — E039 Hypothyroidism, unspecified: Secondary | ICD-10-CM | POA: Diagnosis present

## 2017-07-10 DIAGNOSIS — R29810 Facial weakness: Secondary | ICD-10-CM | POA: Diagnosis present

## 2017-07-10 DIAGNOSIS — G4733 Obstructive sleep apnea (adult) (pediatric): Secondary | ICD-10-CM | POA: Diagnosis not present

## 2017-07-10 DIAGNOSIS — Z87891 Personal history of nicotine dependence: Secondary | ICD-10-CM

## 2017-07-10 DIAGNOSIS — K219 Gastro-esophageal reflux disease without esophagitis: Secondary | ICD-10-CM | POA: Diagnosis present

## 2017-07-10 DIAGNOSIS — R569 Unspecified convulsions: Secondary | ICD-10-CM

## 2017-07-10 DIAGNOSIS — R471 Dysarthria and anarthria: Secondary | ICD-10-CM

## 2017-07-10 DIAGNOSIS — Z96642 Presence of left artificial hip joint: Secondary | ICD-10-CM

## 2017-07-10 DIAGNOSIS — A419 Sepsis, unspecified organism: Secondary | ICD-10-CM | POA: Insufficient documentation

## 2017-07-10 DIAGNOSIS — Z91138 Patient's unintentional underdosing of medication regimen for other reason: Secondary | ICD-10-CM | POA: Diagnosis not present

## 2017-07-10 DIAGNOSIS — Z96653 Presence of artificial knee joint, bilateral: Secondary | ICD-10-CM | POA: Diagnosis present

## 2017-07-10 DIAGNOSIS — I10 Essential (primary) hypertension: Secondary | ICD-10-CM | POA: Insufficient documentation

## 2017-07-10 DIAGNOSIS — H5500 Unspecified nystagmus: Secondary | ICD-10-CM | POA: Diagnosis present

## 2017-07-10 DIAGNOSIS — E876 Hypokalemia: Secondary | ICD-10-CM | POA: Diagnosis not present

## 2017-07-10 DIAGNOSIS — Y92009 Unspecified place in unspecified non-institutional (private) residence as the place of occurrence of the external cause: Secondary | ICD-10-CM | POA: Diagnosis not present

## 2017-07-10 DIAGNOSIS — E1165 Type 2 diabetes mellitus with hyperglycemia: Secondary | ICD-10-CM | POA: Insufficient documentation

## 2017-07-10 DIAGNOSIS — Z86718 Personal history of other venous thrombosis and embolism: Secondary | ICD-10-CM

## 2017-07-10 DIAGNOSIS — G9349 Other encephalopathy: Secondary | ICD-10-CM | POA: Diagnosis present

## 2017-07-10 DIAGNOSIS — R1084 Generalized abdominal pain: Secondary | ICD-10-CM | POA: Insufficient documentation

## 2017-07-10 DIAGNOSIS — R197 Diarrhea, unspecified: Secondary | ICD-10-CM | POA: Diagnosis not present

## 2017-07-10 DIAGNOSIS — Z7901 Long term (current) use of anticoagulants: Secondary | ICD-10-CM

## 2017-07-10 DIAGNOSIS — I110 Hypertensive heart disease with heart failure: Secondary | ICD-10-CM | POA: Diagnosis present

## 2017-07-10 DIAGNOSIS — N182 Chronic kidney disease, stage 2 (mild): Secondary | ICD-10-CM

## 2017-07-10 DIAGNOSIS — I6601 Occlusion and stenosis of right middle cerebral artery: Secondary | ICD-10-CM | POA: Diagnosis present

## 2017-07-10 DIAGNOSIS — E119 Type 2 diabetes mellitus without complications: Secondary | ICD-10-CM | POA: Diagnosis present

## 2017-07-10 DIAGNOSIS — R112 Nausea with vomiting, unspecified: Secondary | ICD-10-CM | POA: Diagnosis present

## 2017-07-10 DIAGNOSIS — E860 Dehydration: Secondary | ICD-10-CM | POA: Diagnosis present

## 2017-07-10 DIAGNOSIS — Z86711 Personal history of pulmonary embolism: Secondary | ICD-10-CM

## 2017-07-10 DIAGNOSIS — G9340 Encephalopathy, unspecified: Secondary | ICD-10-CM | POA: Diagnosis present

## 2017-07-10 DIAGNOSIS — I5032 Chronic diastolic (congestive) heart failure: Secondary | ICD-10-CM | POA: Diagnosis present

## 2017-07-10 DIAGNOSIS — R9431 Abnormal electrocardiogram [ECG] [EKG]: Secondary | ICD-10-CM | POA: Diagnosis present

## 2017-07-10 DIAGNOSIS — T426X6A Underdosing of other antiepileptic and sedative-hypnotic drugs, initial encounter: Secondary | ICD-10-CM | POA: Diagnosis present

## 2017-07-10 DIAGNOSIS — R42 Dizziness and giddiness: Secondary | ICD-10-CM | POA: Insufficient documentation

## 2017-07-10 DIAGNOSIS — Z79899 Other long term (current) drug therapy: Secondary | ICD-10-CM

## 2017-07-10 DIAGNOSIS — Z833 Family history of diabetes mellitus: Secondary | ICD-10-CM

## 2017-07-10 DIAGNOSIS — E785 Hyperlipidemia, unspecified: Secondary | ICD-10-CM | POA: Diagnosis present

## 2017-07-10 DIAGNOSIS — I82409 Acute embolism and thrombosis of unspecified deep veins of unspecified lower extremity: Secondary | ICD-10-CM | POA: Diagnosis present

## 2017-07-10 DIAGNOSIS — R109 Unspecified abdominal pain: Secondary | ICD-10-CM | POA: Diagnosis present

## 2017-07-10 DIAGNOSIS — G8194 Hemiplegia, unspecified affecting left nondominant side: Secondary | ICD-10-CM | POA: Diagnosis present

## 2017-07-10 DIAGNOSIS — E1122 Type 2 diabetes mellitus with diabetic chronic kidney disease: Secondary | ICD-10-CM | POA: Diagnosis not present

## 2017-07-10 HISTORY — DX: Chronic diastolic (congestive) heart failure: I50.32

## 2017-07-10 HISTORY — DX: Diarrhea, unspecified: R19.7

## 2017-07-10 HISTORY — DX: Nausea with vomiting, unspecified: R11.2

## 2017-07-10 HISTORY — DX: Unspecified convulsions: R56.9

## 2017-07-10 HISTORY — DX: Obstructive sleep apnea (adult) (pediatric): G47.33

## 2017-07-10 HISTORY — DX: Sepsis, unspecified organism: A41.9

## 2017-07-10 HISTORY — DX: Hypokalemia: E87.6

## 2017-07-10 LAB — I-STAT CHEM 8, ED
BUN: 8 mg/dL (ref 6–20)
CREATININE: 0.7 mg/dL (ref 0.44–1.00)
Calcium, Ion: 0.76 mmol/L — CL (ref 1.15–1.40)
Chloride: 85 mmol/L — ABNORMAL LOW (ref 101–111)
GLUCOSE: 159 mg/dL — AB (ref 65–99)
HCT: 36 % (ref 36.0–46.0)
HEMOGLOBIN: 12.2 g/dL (ref 12.0–15.0)
POTASSIUM: 3.4 mmol/L — AB (ref 3.5–5.1)
Sodium: 131 mmol/L — ABNORMAL LOW (ref 135–145)
TCO2: 30 mmol/L (ref 22–32)

## 2017-07-10 LAB — URINALYSIS, ROUTINE W REFLEX MICROSCOPIC
BACTERIA UA: NONE SEEN
BILIRUBIN URINE: NEGATIVE
Glucose, UA: NEGATIVE mg/dL
Hgb urine dipstick: NEGATIVE
KETONES UR: 5 mg/dL — AB
LEUKOCYTES UA: NEGATIVE
NITRITE: NEGATIVE
PH: 7 (ref 5.0–8.0)
PROTEIN: 100 mg/dL — AB
SQUAMOUS EPITHELIAL / LPF: NONE SEEN
Specific Gravity, Urine: 1.042 — ABNORMAL HIGH (ref 1.005–1.030)

## 2017-07-10 LAB — CBG MONITORING, ED
GLUCOSE-CAPILLARY: 192 mg/dL — AB (ref 65–99)
Glucose-Capillary: 175 mg/dL — ABNORMAL HIGH (ref 65–99)

## 2017-07-10 LAB — COMPREHENSIVE METABOLIC PANEL
ALBUMIN: 3.6 g/dL (ref 3.5–5.0)
ALT: 10 U/L — ABNORMAL LOW (ref 14–54)
ANION GAP: 15 (ref 5–15)
AST: 18 U/L (ref 15–41)
Alkaline Phosphatase: 72 U/L (ref 38–126)
BILIRUBIN TOTAL: 1.1 mg/dL (ref 0.3–1.2)
BUN: 8 mg/dL (ref 6–20)
CHLORIDE: 88 mmol/L — AB (ref 101–111)
CO2: 27 mmol/L (ref 22–32)
Calcium: 7 mg/dL — ABNORMAL LOW (ref 8.9–10.3)
Creatinine, Ser: 0.72 mg/dL (ref 0.44–1.00)
GFR calc Af Amer: >60 mL/min (ref >60)
GFR calc non Af Amer: >60 mL/min (ref >60)
GLUCOSE: 189 mg/dL — AB (ref 65–99)
POTASSIUM: 2.9 mmol/L — AB (ref 3.5–5.1)
SODIUM: 130 mmol/L — AB (ref 135–145)
TOTAL PROTEIN: 6.1 g/dL — AB (ref 6.5–8.1)

## 2017-07-10 LAB — CBC WITH DIFFERENTIAL/PLATELET
BASOS ABS: 0 10*3/uL (ref 0.0–0.1)
Basophils Relative: 0 %
Eosinophils Absolute: 0 10*3/uL (ref 0.0–0.7)
Eosinophils Relative: 0 %
HEMATOCRIT: 35.5 % — AB (ref 36.0–46.0)
Hemoglobin: 11.8 g/dL — ABNORMAL LOW (ref 12.0–15.0)
LYMPHS ABS: 1.5 10*3/uL (ref 0.7–4.0)
LYMPHS PCT: 9 %
MCH: 30.8 pg (ref 26.0–34.0)
MCHC: 33.2 g/dL (ref 30.0–36.0)
MCV: 92.7 fL (ref 78.0–100.0)
MONO ABS: 0.7 10*3/uL (ref 0.1–1.0)
Monocytes Relative: 4 %
NEUTROS ABS: 14.1 10*3/uL — AB (ref 1.7–7.7)
Neutrophils Relative %: 87 %
Platelets: 269 10*3/uL (ref 150–400)
RBC: 3.83 MIL/uL — AB (ref 3.87–5.11)
RDW: 13.4 % (ref 11.5–15.5)
WBC: 16.3 10*3/uL — ABNORMAL HIGH (ref 4.0–10.5)

## 2017-07-10 LAB — APTT: APTT: 27 s (ref 24–36)

## 2017-07-10 LAB — RAPID URINE DRUG SCREEN, HOSP PERFORMED
Amphetamines: NOT DETECTED
Barbiturates: NOT DETECTED
Benzodiazepines: NOT DETECTED
COCAINE: NOT DETECTED
OPIATES: NOT DETECTED
Tetrahydrocannabinol: NOT DETECTED

## 2017-07-10 LAB — PROTIME-INR
INR: 1.33
Prothrombin Time: 16.3 seconds — ABNORMAL HIGH (ref 11.4–15.2)

## 2017-07-10 LAB — MAGNESIUM: Magnesium: 0.2 mg/dL — CL (ref 1.7–2.4)

## 2017-07-10 LAB — I-STAT TROPONIN, ED
TROPONIN I, POC: 0.04 ng/mL (ref 0.00–0.08)
Troponin i, poc: 0 ng/mL (ref 0.00–0.08)

## 2017-07-10 LAB — ETHANOL: Alcohol, Ethyl (B): <10 mg/dL (ref <10)

## 2017-07-10 LAB — AMMONIA: Ammonia: 18 umol/L (ref 9–35)

## 2017-07-10 MED ORDER — METOPROLOL TARTRATE 5 MG/5ML IV SOLN
5.0000 mg | Freq: Three times a day (TID) | INTRAVENOUS | Status: DC
  Administered 2017-07-11 (×3): 5 mg via INTRAVENOUS
  Filled 2017-07-10 (×4): qty 5

## 2017-07-10 MED ORDER — INSULIN ASPART 100 UNIT/ML ~~LOC~~ SOLN
0.0000 [IU] | Freq: Every day | SUBCUTANEOUS | Status: DC
Start: 2017-07-10 — End: 2017-07-13

## 2017-07-10 MED ORDER — METOCLOPRAMIDE HCL 10 MG PO TABS: 10.0000 mg | ORAL_TABLET | Freq: Three times a day (TID) | ORAL | 0 refills | Status: DC | PRN

## 2017-07-10 MED ORDER — FAMOTIDINE IN NACL 20-0.9 MG/50ML-% IV SOLN: 20.0000 mg | Freq: Two times a day (BID) | INTRAVENOUS | Status: DC

## 2017-07-10 MED ORDER — FENTANYL CITRATE (PF) 100 MCG/2ML IJ SOLN
100.0000 ug | Freq: Once | INTRAMUSCULAR | Status: AC
  Administered 2017-07-10: 100 ug via INTRAVENOUS
  Filled 2017-07-10: qty 2

## 2017-07-10 MED ORDER — ACETAMINOPHEN 650 MG RE SUPP: 650.0000 mg | Freq: Four times a day (QID) | RECTAL | Status: DC | PRN

## 2017-07-10 MED ORDER — LORAZEPAM 2 MG/ML IJ SOLN
INTRAMUSCULAR | Status: AC
  Filled 2017-07-10: qty 1

## 2017-07-10 MED ORDER — ACETAMINOPHEN 500 MG PO TABS
1000.0000 mg | ORAL_TABLET | Freq: Once | ORAL | Status: AC
  Administered 2017-07-10: 1000 mg via ORAL
  Filled 2017-07-10: qty 2

## 2017-07-10 MED ORDER — LEVETIRACETAM 500 MG/5ML IV SOLN
1000.0000 mg | Freq: Once | INTRAVENOUS | Status: AC
Start: 2017-07-10 — End: 2017-07-10
  Administered 2017-07-10: 1000 mg via INTRAVENOUS
  Filled 2017-07-10: qty 10

## 2017-07-10 MED ORDER — LEVOTHYROXINE SODIUM 100 MCG IV SOLR
50.0000 ug | Freq: Every day | INTRAVENOUS | Status: DC
  Administered 2017-07-11: 50 ug via INTRAVENOUS
  Filled 2017-07-10: qty 5

## 2017-07-10 MED ORDER — POTASSIUM CHLORIDE 10 MEQ/100ML IV SOLN: 10.0000 meq | INTRAVENOUS | Status: AC

## 2017-07-10 MED ORDER — SODIUM CHLORIDE 0.9 % IV BOLUS (SEPSIS)
1000.0000 mL | Freq: Once | INTRAVENOUS | Status: AC
  Administered 2017-07-10: 1000 mL via INTRAVENOUS

## 2017-07-10 MED ORDER — ACETAMINOPHEN 325 MG PO TABS: 650.0000 mg | ORAL_TABLET | Freq: Four times a day (QID) | ORAL | Status: DC | PRN

## 2017-07-10 MED ORDER — LORAZEPAM 2 MG/ML IJ SOLN: 1.0000 mg | Freq: Once | INTRAMUSCULAR | Status: DC

## 2017-07-10 MED ORDER — ZOLPIDEM TARTRATE 5 MG PO TABS
5.0000 mg | ORAL_TABLET | Freq: Every evening | ORAL | Status: DC | PRN
  Administered 2017-07-11: 5 mg via ORAL
  Filled 2017-07-10: qty 1

## 2017-07-10 MED ORDER — SODIUM CHLORIDE 0.9 % IV SOLN: 1000.0000 mg | Freq: Once | INTRAVENOUS | Status: DC

## 2017-07-10 MED ORDER — METOCLOPRAMIDE HCL 5 MG/ML IJ SOLN
5.0000 mg | Freq: Once | INTRAMUSCULAR | Status: AC
Start: 2017-07-10 — End: 2017-07-10
  Administered 2017-07-10: 5 mg via INTRAVENOUS
  Filled 2017-07-10: qty 2

## 2017-07-10 MED ORDER — HYDRALAZINE HCL 20 MG/ML IJ SOLN
5.0000 mg | INTRAMUSCULAR | Status: DC | PRN
  Administered 2017-07-11 – 2017-07-12 (×2): 5 mg via INTRAVENOUS
  Filled 2017-07-10 (×2): qty 1

## 2017-07-10 MED ORDER — IOPAMIDOL (ISOVUE-300) INJECTION 61%
INTRAVENOUS | Status: AC
  Administered 2017-07-10: 100 mL
  Filled 2017-07-10: qty 100

## 2017-07-10 MED ORDER — POTASSIUM CHLORIDE CRYS ER 20 MEQ PO TBCR
40.0000 meq | EXTENDED_RELEASE_TABLET | Freq: Once | ORAL | Status: AC
  Administered 2017-07-10: 40 meq via ORAL
  Filled 2017-07-10: qty 2

## 2017-07-10 MED ORDER — HYDROXYZINE HCL 50 MG/ML IM SOLN
25.0000 mg | Freq: Four times a day (QID) | INTRAMUSCULAR | Status: DC | PRN
  Filled 2017-07-10: qty 0.5

## 2017-07-10 MED ORDER — LORAZEPAM 2 MG/ML IJ SOLN
1.0000 mg | Freq: Once | INTRAMUSCULAR | Status: AC
  Administered 2017-07-10: 1 mg via INTRAVENOUS
  Filled 2017-07-10: qty 1

## 2017-07-10 MED ORDER — IOPAMIDOL (ISOVUE-370) INJECTION 76%
INTRAVENOUS | Status: AC
  Administered 2017-07-10: 50 mL
  Filled 2017-07-10: qty 50

## 2017-07-10 MED ORDER — LORAZEPAM 2 MG/ML IJ SOLN: 1.0000 mg | INTRAMUSCULAR | Status: DC | PRN

## 2017-07-10 MED ORDER — SODIUM CHLORIDE 0.9 % IV SOLN
1.0000 g | Freq: Once | INTRAVENOUS | Status: AC
  Administered 2017-07-10: 1 g via INTRAVENOUS
  Filled 2017-07-10: qty 10

## 2017-07-10 MED ORDER — SODIUM CHLORIDE 0.9 % IV SOLN
INTRAVENOUS | Status: DC
  Administered 2017-07-11: via INTRAVENOUS

## 2017-07-10 MED ORDER — SODIUM CHLORIDE 0.9 % IV SOLN
750.0000 mg | Freq: Two times a day (BID) | INTRAVENOUS | Status: DC
  Administered 2017-07-11 (×2): 750 mg via INTRAVENOUS
  Filled 2017-07-10 (×3): qty 7.5

## 2017-07-10 MED ORDER — INSULIN ASPART 100 UNIT/ML ~~LOC~~ SOLN
0.0000 [IU] | Freq: Three times a day (TID) | SUBCUTANEOUS | Status: DC
  Administered 2017-07-12: 1 [IU] via SUBCUTANEOUS
  Administered 2017-07-12: 3 [IU] via SUBCUTANEOUS

## 2017-07-10 NOTE — ED Notes (Signed)
1 mg ativan IV given per verbal order Dr. Leonel Ramsay.

## 2017-07-10 NOTE — ED Notes (Signed)
[place] PA to evaluate

## 2017-07-10 NOTE — ED Triage Notes (Signed)
To ED via [place] from home with c/o nausea/vomiting/dizziness--pt has been in the hospital at [facility] for 1 month from 11/20-12/21, here on 12/23-12/24 for same-- has not been able to keep any =thing down since discharged yesterday.  Received Zofran 8m IV enroute Has an [facility] nurse scheduled for tomorrow.

## 2017-07-10 NOTE — ED Notes (Signed)
Husband returned with oxygen tank to take pt home. Waiting on physician for re-eval

## 2017-07-10 NOTE — Discharge Instructions (Signed)
Take the medication prescribed as needed for nausea.  Take Tylenol as directed for pain. Make sure that you drink at least six 8 ounce glasses of water  each day in order to stay well-hydrated.  Avoid milk or foods containing milk such as cheese or ice cream while having diarrhea.  Return if pain or vomiting not well controlled with the medications recommended or see your doctor

## 2017-07-10 NOTE — ED Provider Notes (Signed)
[facility] EMERGENCY DEPARTMENT Provider Note   CSN: [id number] Arrival date & time: 07/10/17  1847     History   Chief Complaint Chief Complaint  Patient presents with  . Code Stroke  . Altered Mental Status  . Abdominal Pain    HPI Amanda Short is a 76 y.o. female with medical history significant for DVT and PE on Xarelto, HLD, HTN, DM type II, and seizures presents today with a chief complaint acute onset, improving aphasia and altered mental status.  Patient was seen and evaluated for abdominal pain, nausea, vomiting, and diarrhea which has been ongoing for the past 5 days or so.  Lab work and imaging was reassuring and she was found to be safe for discharge home with symptomatic treatment.  Patient was discharged 5 days ago for evaluation of altered mental status which was thought to be related to prolonged seizure.  Patient's husband states that at around 3:30 PM she began slurring her speech and "not making any sense when she talked.  It seemed like she was taking parts of some words and parts of other words and combining them.  She looked like she knew what she was trying to say but she was not communicating it ".  This was accompanied with generalized weakness and patient was unable to walk.  Patient was brought back to the ED for further evaluation and code stroke was initiated.  She was seen by Dr. Leonel Ramsay neurologist who gave ativan IM while in CT with improvement in patient's symptoms.  On my evaluation, she denies slurred speech, numbness, tingling, weakness, chest pain, shortness of breath.  She has ongoing abdominal pain which is generalized. Family states she appears to be back to her mental status baseline.   The history is provided by the patient.    Past Medical History:  Diagnosis Date  . Arthritis    "back, hands" (07/22/2015)  . Cancer of left breast ([place]) 2006   S/P lumpectomy  . Chronic diastolic CHF (congestive heart failure) ([place])  07/10/2017  . Complication of anesthesia    "brief breathing problem at surgery center in ~ 2005 when I had gallbladder OR"  . Concussion 03/2013   due to fall  . Depression   . DVT (deep venous thrombosis) ([place]) 03/2013   LLE  . GERD (gastroesophageal reflux disease)   . Hyperlipidemia   . Hypertension   . Hypothyroidism   . OSA treated with BiPAP   . Pulmonary embolism ([place]) 03/2013  . Seizures ([place])   . Thyroid disease    Hypothyroid  . Type II diabetes mellitus [facility])     Patient Active Problem List   Diagnosis Date Noted  . Nausea vomiting and diarrhea 07/10/2017  . Hypokalemia 07/10/2017  . Chronic diastolic CHF (congestive heart failure) ([place]) 07/10/2017  . Seizure ([place]) 07/10/2017  . OSA (obstructive sleep apnea) 07/10/2017  . Sepsis ([place]) 07/10/2017  . Seizure disorder ([place]) 07/05/2017  . Pulmonary HTN ([place]) 07/05/2017  . Anxiety state   . Physical debility 06/23/2017  . 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 History of acute respiratory failure   . Tracheostomy, acute management ([place])   . Aspiration pneumonia ([place])   . Encephalopathy   . Status epilepticus ([place])   . Benign essential HTN   . 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 History of pulmonary embolism   . Acute blood loss anemia   . Diverticulitis of colon   . Tachypnea   .  Encounter for orogastric (OG) tube placement   . Encounter for central line placement   . Encounter for attention to tracheostomy ([place])   . Acute respiratory failure with hypoxia ([place])   . Acute encephalopathy 06/06/2017  . Diverticulitis of large intestine without perforation or abscess without bleeding   . Abdominal pain   . Non-intractable cyclical vomiting with nausea   . Abnormal CT of the abdomen   . Hypocalcemia 05/27/2017  . 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 Colitis 05/27/2017  . Diabetic foot infection ([place]) 07/21/2015  . Diabetes mellitus type 2, controlled ([place]) 07/21/2015  . Cellulitis of foot, right  07/21/2015  . Cellulitis of right foot 07/21/2015  . Acute pulmonary embolism ([place]) 04/01/2013  . DVT (deep venous thrombosis) ([place]) 04/01/2013  . HX: breast cancer 04/01/2013  . Concussion 04/01/2013  . Diabetes ([place]) 04/01/2013  . Essential hypertension, benign 04/01/2013  . Hypothyroidism 04/01/2013  . OSA on CPAP 04/01/2013    Past Surgical History:  Procedure Laterality Date  . ABDOMINAL HYSTERECTOMY  1970s  . BREAST BIOPSY Left 2006  . BREAST LUMPECTOMY Left 2006  . BUNIONECTOMY WITH HAMMERTOE RECONSTRUCTION Left   . ESOPHAGOGASTRODUODENOSCOPY N/A 05/29/2017   Procedure: ESOPHAGOGASTRODUODENOSCOPY (EGD);  Surgeon: Irene Shipper, MD;  Location: [facility];  Service: Endoscopy;  Laterality: N/A;  . JOINT REPLACEMENT    . LAPAROSCOPIC CHOLECYSTECTOMY  ~ 2005  . TOTAL HIP ARTHROPLASTY Left 2010  . TOTAL KNEE ARTHROPLASTY Bilateral 2005-2006    OB History    No data available       Home Medications    Prior to Admission medications   Medication Sig Start Date End Date Taking? Authorizing Provider  acetaminophen (TYLENOL) 500 MG tablet Take 500 mg by mouth every 6 (six) hours as needed for mild pain.    [provider]  calcium-vitamin D (OSCAL 500/200 D-3) 500-200 MG-UNIT tablet Take 1 tablet 2 (two) times daily by mouth. 05/30/17 06/09/18  Barton Dubois, MD  DULoxetine (CYMBALTA) 60 MG capsule Take 60 mg by mouth daily.    [provider]  febuxostat (ULORIC) 40 MG tablet Take 40 mg daily by mouth.    [provider]  levETIRAcetam (KEPPRA) 750 MG tablet Take 1 tablet (750 mg total) by mouth 2 (two) times daily. 07/05/17   Love, Ivan Anchors, PA-C  levothyroxine (SYNTHROID, LEVOTHROID) 100 MCG tablet Take 100 mcg by mouth daily before breakfast.    [provider]  metFORMIN (GLUCOPHAGE) 500 MG tablet Take 500 mg by mouth 2 (two) times daily with a meal.     [provider]  metoCLOPramide (REGLAN) 10 MG tablet Take 1 tablet (10  mg total) by mouth every 8 (eight) hours as needed for nausea (nausea/headache). 07/10/17   Orlie Dakin, MD  metoCLOPramide (REGLAN) 10 MG tablet Take 1 tablet (10 mg total) by mouth every 8 (eight) hours as needed for nausea or vomiting. 07/10/17   Orlie Dakin, MD  metoprolol (TOPROL-XL) 200 MG 24 hr tablet Take 100 mg by mouth at bedtime.     [provider]  montelukast (SINGULAIR) 10 MG tablet Take 10 mg at bedtime by mouth.    [provider]  Multiple Vitamin (MULTIVITAMIN WITH MINERALS) TABS tablet Take 1 tablet daily by mouth.    [provider]  omeprazole (PRILOSEC) 40 MG capsule Take 40 mg by mouth 2 (two) times daily.    [provider]  ondansetron (ZOFRAN ODT) 4 MG disintegrating tablet Take 1 tablet (4  mg total) by mouth every 8 (eight) hours as needed for nausea or vomiting. 07/09/17   Horton, Barbette Hair, MD  QUEtiapine (SEROQUEL) 25 MG tablet Take 1 tablet (25 mg total) by mouth at bedtime. 07/05/17   Love, Ivan Anchors, PA-C  rivaroxaban (XARELTO) 20 MG TABS tablet Take 1 tablet (20 mg total) by mouth daily with supper. 07/05/17   Love, Ivan Anchors, PA-C  spironolactone (ALDACTONE) 25 MG tablet Take 2 tablets (50 mg total) by mouth daily. 07/05/17   Bary Leriche, PA-C    Family History Family History  Problem Relation Age of Onset  . Diabetes Unknown   . Diabetes Mellitus II Sister     Social History Social History   Tobacco Use  . Smoking status: Former Smoker    Packs/day: 1.00    Years: 30.00    Pack years: 30.00    Types: Cigarettes  . Smokeless tobacco: Never Used  . Tobacco comment: "quit smoking cigarettes in the 1990s"  Substance Use Topics  . Alcohol use: No  . Drug use: No     Allergies   Allopurinol   Review of Systems Review of Systems  Gastrointestinal: Positive for abdominal pain, diarrhea, nausea and vomiting. Negative for blood in stool and constipation.  Neurological: Positive for tremors, seizures,  speech difficulty and weakness. Negative for syncope and facial asymmetry.  Psychiatric/Behavioral: Positive for confusion.  All other systems reviewed and are negative.    Physical Exam Updated Vital Signs BP 128/70   Pulse 73   Temp 99 F (37.2 C) (Rectal)   Resp 20   Ht _0  (1.549 m)   Wt 68.7 kg (151 lb 7.3 oz)   SpO2 99%   BMI 28.62 kg/m   Physical Exam  Constitutional: She is oriented to person, place, and time. She appears well-developed and well-nourished. No distress.  HENT:  Head: Normocephalic and atraumatic.  Eyes: Conjunctivae are normal. Right eye exhibits no discharge. Left eye exhibits no discharge.  Neck: No JVD present. No tracheal deviation present.  Cardiovascular: Normal rate.  Pulmonary/Chest: Effort normal.  Abdominal: Soft. Normal appearance. She exhibits no distension. Bowel sounds are increased. There is generalized tenderness. There is no rigidity, no rebound, no guarding, no tenderness at McBurney's point and negative Murphy's sign.  Maximally tender in the LLQ  Musculoskeletal: She exhibits no edema.  5/5 strength of BUE and BLE major muscle groups with no tenderness, deformity, swelling, or crepitus on palpation.   Neurological: She is alert and oriented to person, place, and time.  Fluent speech with no evidence of dysarthria or aphasia, answers questions appropriately.  No facial droop.  Cranial nerves II through XII tested and intact.  No pronator drift.  Good grip strength bilaterally and good strength with plantarflexion bilaterally. Diffuse tremor present with rest and movement noted to the jaw and extremities.   Skin: Skin is warm and dry. No erythema.  Psychiatric: She has a normal mood and affect. Her behavior is normal.  Nursing note and vitals reviewed.    ED Treatments / Results  Labs (all labs ordered are listed, but only abnormal results are displayed) Labs Reviewed  PROTIME-INR - Abnormal; Notable for the following components:        Result Value   Prothrombin Time 16.3 (*)    All other components within normal limits  URINALYSIS, ROUTINE W REFLEX MICROSCOPIC - Abnormal; Notable for the following components:   Specific Gravity, Urine 1.042 (*)    Ketones, ur 5 (*)  Protein, ur 100 (*)    All other components within normal limits  MAGNESIUM - Abnormal; Notable for the following components:   Magnesium 0.2 (*)    All other components within normal limits  I-STAT CHEM 8, ED - Abnormal; Notable for the following components:   Sodium 131 (*)    Potassium 3.4 (*)    Chloride 85 (*)    Glucose, Bld 159 (*)    Calcium, Ion 0.76 (*)    All other components within normal limits  CBG MONITORING, ED - Abnormal; Notable for the following components:   Glucose-Capillary 175 (*)    All other components within normal limits  C DIFFICILE QUICK SCREEN W PCR REFLEX  CULTURE, BLOOD (ROUTINE X 2)  CULTURE, BLOOD (ROUTINE X 2)  URINE CULTURE  ETHANOL  APTT  RAPID URINE DRUG SCREEN, HOSP PERFORMED  AMMONIA  BRAIN NATRIURETIC PEPTIDE  LIPASE, BLOOD  LACTIC ACID, PLASMA  LACTIC ACID, PLASMA  PROCALCITONIN  HEMOGLOBIN A1C  TROPONIN I  TROPONIN I  TROPONIN I  I-STAT TROPONIN, ED    EKG  EKG Interpretation None       Radiology Ct Angio Head W Or Wo Contrast  Result Date: 07/10/2017 CLINICAL DATA:  Nausea and vomiting.  Dizziness.  Aphasia. EXAM: CT ANGIOGRAPHY HEAD AND NECK TECHNIQUE: Multidetector CT imaging of the head and neck was performed using the standard protocol during bolus administration of intravenous contrast. Multiplanar CT image reconstructions and MIPs were obtained to evaluate the vascular anatomy. Carotid stenosis measurements (when applicable) are obtained utilizing NASCET criteria, using the distal internal carotid diameter as the denominator. CONTRAST:  41m ISOVUE-370 IOPAMIDOL (ISOVUE-370) INJECTION 76% COMPARISON:  Head CT earlier today.  Brain MRI 06/09/2017. FINDINGS: CTA NECK FINDINGS  Aortic arch: Atherosclerosis. No acute finding. Four vessel branching. Right carotid system: Mild atherosclerotic plaque at the ICA bulb. No stenosis or dissection. Left carotid system: Mild atherosclerotic plaque at the ICA bulb. No stenosis or dissection. Vertebral arteries: The left vertebral artery arises from the arch. The vessels are smooth and diffusely patent. Skeleton: Advanced cervical disc degeneration.  Facet spurring. Other neck: No incidental mass or inflammation noted. Upper chest: No acute finding. Review of the MIP images confirms the above findings CTA HEAD FINDINGS Anterior circulation: There is extensive atherosclerotic plaque on the carotid siphons. Cut off at the origin of the right M1 vessel with prominent lenticulostriates and early reconstitution of under filled right MCA branches. On coronal MIPS, there is visible pial collaterals extending from the right ACA over the convexity. Suspect this is a chronic occlusion. In retrospect similar appearance seen on 06/09/2017 T2 weighted imaging. No left-sided major branch occlusion is seen. Aplastic left A1 segment. Posterior circulation: Atherosclerotic plaque on the bilateral vertebral arteries, extensive on the left where there is advanced narrowing, quantification limited by degree of calcified plaque. The basilar is diffusely patent. Fetal type left PCA with high-grade proximal P2 segment atheromatous narrowing. Venous sinuses: Negative Anatomic variants: As above Delayed phase: Not obtained in the emergent setting Case discussed with Dr. KLeonel Ramsayvia telephone. Review of the MIP images confirms the above findings IMPRESSION: 1. 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 2. Extensive intracranial atherosclerosis. Advanced proximal left V4 and left PCA narrowing. 3. Atherosclerosis in the neck without flow limiting stenosis. Electronically Signed   By:  JMonte FantasiaM.D.   On: 07/10/2017 20:11   Ct Angio Neck W  Or Wo Contrast  Result Date: 07/10/2017 CLINICAL DATA:  Nausea and vomiting.  Dizziness.  Aphasia. EXAM: CT ANGIOGRAPHY HEAD AND NECK TECHNIQUE: Multidetector CT imaging of the head and neck was performed using the standard protocol during bolus administration of intravenous contrast. Multiplanar CT image reconstructions and MIPs were obtained to evaluate the vascular anatomy. Carotid stenosis measurements (when applicable) are obtained utilizing NASCET criteria, using the distal internal carotid diameter as the denominator. CONTRAST:  6m ISOVUE-370 IOPAMIDOL (ISOVUE-370) INJECTION 76% COMPARISON:  Head CT earlier today.  Brain MRI 06/09/2017. FINDINGS: CTA NECK FINDINGS Aortic arch: Atherosclerosis. No acute finding. Four vessel branching. Right carotid system: Mild atherosclerotic plaque at the ICA bulb. No stenosis or dissection. Left carotid system: Mild atherosclerotic plaque at the ICA bulb. No stenosis or dissection. Vertebral arteries: The left vertebral artery arises from the arch. The vessels are smooth and diffusely patent. Skeleton: Advanced cervical disc degeneration.  Facet spurring. Other neck: No incidental mass or inflammation noted. Upper chest: No acute finding. Review of the MIP images confirms the above findings CTA HEAD FINDINGS Anterior circulation: There is extensive atherosclerotic plaque on the carotid siphons. Cut off at the origin of the right M1 vessel with prominent lenticulostriates and early reconstitution of under filled right MCA branches. On coronal MIPS, there is visible pial collaterals extending from the right ACA over the convexity. Suspect this is a chronic occlusion. In retrospect similar appearance seen on 06/09/2017 T2 weighted imaging. No left-sided major branch occlusion is seen. Aplastic left A1 segment. Posterior circulation: Atherosclerotic plaque on the bilateral vertebral arteries, extensive on the  left where there is advanced narrowing, quantification limited by degree of calcified plaque. The basilar is diffusely patent. Fetal type left PCA with high-grade proximal P2 segment atheromatous narrowing. Venous sinuses: Negative Anatomic variants: As above Delayed phase: Not obtained in the emergent setting Case discussed with Dr. KLeonel Ramsayvia telephone. Review of the MIP images confirms the above findings IMPRESSION: 1. 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 2. Extensive intracranial atherosclerosis. Advanced proximal left V4 and left PCA narrowing. 3. Atherosclerosis in the neck without flow limiting stenosis. Electronically Signed   By: JMonte FantasiaM.D.   On: 07/10/2017 20:11   Ct Abdomen Pelvis W Contrast  Result Date: 07/10/2017 CLINICAL DATA:  Nausea, vomiting. EXAM: CT ABDOMEN AND PELVIS WITH CONTRAST TECHNIQUE: Multidetector CT imaging of the abdomen and pelvis was performed using the standard protocol following bolus administration of intravenous contrast. CONTRAST:  1052mISOVUE-300 IOPAMIDOL (ISOVUE-300) INJECTION 61% COMPARISON:  05/27/2017 FINDINGS: Lower chest: Calcified granuloma in the right lower lobe. Heart is mildly enlarged. Hepatobiliary: Prior cholecystectomy.  No focal hepatic abnormality. Pancreas: No focal abnormality or ductal dilatation. Spleen: No focal abnormality.  Normal size. Adrenals/Urinary Tract: No adrenal abnormality. No focal renal abnormality. No stones or hydronephrosis. Urinary bladder is unremarkable. Stomach/Bowel: Descending colonic and sigmoid diverticulosis. No active diverticulitis. Appendix is normal. Stomach and small bowel decompressed, unremarkable. Vascular/Lymphatic: Aorta and iliac vessels are heavily calcified. No evidence of aneurysm or adenopathy. Reproductive: Prior hysterectomy.  No adnexal masses. Other: No free fluid or free air. Musculoskeletal:  Prior left hip replacement. Degenerative changes in the lumbar spine. No acute findings. IMPRESSION: Colonic diverticulosis.  No active diverticulitis. Normal appendix. Mild cardiomegaly. Aortoiliac atherosclerosis. No acute findings in the abdomen or pelvis. Electronically Signed   By: KeRolm Baptise.D.   On: 07/10/2017 12:34   Dg Abdomen  Acute W/chest  Result Date: 07/09/2017 CLINICAL DATA:  Nausea, vomiting and diarrhea. EXAM: DG ABDOMEN ACUTE W/ 1V CHEST COMPARISON:  Chest radiograph 06/27/2017 FINDINGS: Cardiomediastinal contours are unchanged. Nodular opacity in the right lower lung is unchanged. No focal airspace consolidation or pulmonary edema. No pleural effusion or pneumothorax. 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 There is no free intraperitoneal air. Bowel gas pattern is normal. There is right convex lumbar scoliosis. Left total hip arthroplasty visualized portion is normal. IMPRESSION: No acute abnormality of the abdomen or pelvis. Electronically Signed   By: Ulyses Jarred M.D.   On: 07/09/2017 01:00   Ct Head Code Stroke Wo Contrast  Result Date: 07/10/2017 CLINICAL DATA:  Code stroke.  Nausea, vomiting, and dizziness. EXAM: CT HEAD WITHOUT CONTRAST TECHNIQUE: Contiguous axial images were obtained from the base of the skull through the vertex without intravenous contrast. COMPARISON:  06/19/2017 FINDINGS: Brain: No evidence of acute infarction, hemorrhage, hydrocephalus, extra-axial collection or mass lesion/mass effect. Chronic small vessel ischemia in the cerebral white matter. 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 Atrophy with variable sulcal widening. Vascular: Atherosclerotic calcification. Skull: Negative Sinuses/Orbits: No acute finding Other: These results were communicated to Dr. Leonel Ramsay at [place] 12/25/2018by text page via the [facility] messaging system. ASPECTS [facility] Stroke Program Early CT Score) Not scored with this history.  IMPRESSION: 1. Motion degraded study without acute finding. 2. Chronic small vessel disease. Electronically Signed   By: Monte Fantasia M.D.   On: 07/10/2017 19:46    Procedures Procedures (including critical care time)  Medications Ordered in ED Medications  LORazepam (ATIVAN) 2 MG/ML injection (not administered)  calcium gluconate 1 g in sodium chloride 0.9 % 100 mL IVPB (1 g Intravenous New Bag/Given 07/10/17 2359)  levETIRAcetam (KEPPRA) 750 mg in sodium chloride 0.9 % 100 mL IVPB (not administered)  potassium chloride 10 mEq in 100 mL IVPB (not administered)  LORazepam (ATIVAN) injection 1 mg (not administered)  levothyroxine (SYNTHROID, LEVOTHROID) injection 50 mcg (not administered)  metoprolol tartrate (LOPRESSOR) injection 5 mg (not administered)  hydrALAZINE (APRESOLINE) injection 5 mg (not administered)  0.9 %  sodium chloride infusion ( Intravenous New Bag/Given 07/11/17 0001)  insulin aspart (novoLOG) injection 0-9 Units (not administered)  insulin aspart (novoLOG) injection 0-5 Units (not administered)  acetaminophen (TYLENOL) tablet 650 mg (not administered)    Or  acetaminophen (TYLENOL) suppository 650 mg (not administered)  zolpidem (AMBIEN) tablet 5 mg (not administered)  hydrOXYzine (VISTARIL) injection 25 mg (not administered)  iopamidol (ISOVUE-370) 76 % injection (50 mLs  Contrast Given 07/10/17 1945)  levETIRAcetam (KEPPRA) 1,000 mg in sodium chloride 0.9 % 100 mL IVPB (0 mg Intravenous Stopped 07/10/17 2156)  LORazepam (ATIVAN) injection 1 mg (1 mg Intravenous Given 07/10/17 2207)  sodium chloride 0.9 % bolus 1,000 mL (1,000 mLs Intravenous New Bag/Given 07/10/17 2231)     Initial Impression / Assessment and Plan / ED Course  I have reviewed the triage vital signs and the nursing notes.  Pertinent labs & imaging results that were available during my care of the patient were reviewed by me and considered in my medical decision making (see chart for  details).     Patient presents with acute onset, improved  dysarthria and aphasia which began at around 3:30pm today.  Improved after administration of Ativan during CT.  Afebrile, vital signs are stable.  Recently discharged from hospital for evaluation of altered mental status thought to be due to prolonged seizure activity.  She is  tremulous on examination but otherwise no focal neurological deficits.  Patient seen and evaluated by Dr. Leonel Ramsay with neurology who thinks symptoms are likely post-ictal in nature secondary to status epilepticus.  She has not been tolerating food or medications over the past several days and thus has not had her Keppra. 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  While in the ED, patient had a recurrence in her aphasia and dysarthria.  This improved after administration of 1 mg Ativan IV. She has not tolerated PO for the past several days and appears dehydrated but otherwise nontoxic. Given 2L NS in the ED. Spoke with Dr. Blaine Hamper with [facility] who agrees to assume care of patient and bring her into the hospital for further evaluation and workup.  She will be admitted to a stepdown bed.  Patient seen and evaluated by Dr. Ellender Hose who agrees with assessment and plan at this time.  Final Clinical Impressions(s) / ED Diagnoses   Final diagnoses:  Dysarthria  Aphasia    ED Discharge Orders    None       Debroah Baller 07/11/17 0011    Duffy Bruce, MD 07/11/17 [phone]

## 2017-07-10 NOTE — ED Notes (Signed)
Patient verbalizes understanding of discharge instructions. Opportunity for questioning and answers were provided. Armband removed by staff, pt discharged from ED via wheelchair with husband.

## 2017-07-10 NOTE — ED Triage Notes (Signed)
Pt husband also stating that she has increasing slurred speech and shakiness.

## 2017-07-10 NOTE — ED Notes (Signed)
MD made aware patient is asking for nausea medication.  Patient also requesting [place] to find her husband.  Announcement made in the lobby.

## 2017-07-10 NOTE — H&P (Addendum)
History and Physical    Amanda Short ION:[id number] DOB: 1940/07/24 DOA: 07/10/2017  Referring MD/NP/PA:   PCP: System, Pcp Not In   Patient coming from:  The patient is coming from home.  At baseline, pt is independent for most of ADL.  Chief Complaint: Nausea, vomiting, diarrhea, abdominal pain, altered mental status, seizure  HPI: Amanda Short is a 76 y.o. female with medical history significant of hypertension, hyperlipidemia, diabetes mellitus, GERD, hypothyroidism, gout, depression, seizure, DVT/PE on Xarelto, OSA on CPAP, left breast cancer 2006 (lumpectomy), dCHF, who presents with nausea, vomiting, diarrhea, abdominal pain and altered mental status and a seizure.  Patient was recently hospitalized from 12/21-12/8 due to seizure and respiratory failure due to possible aspiration pneumonia. Patient was discharged to rehabilitation facility. She completed a rehabilitation from 12/8-12/20. Per her husband, patient has been having nausea, vomiting, diarrhea and abdominal pain in the past 5 days. She vomited at least 5 times each day. She has 3-5 loose stool bowel movement each day. Her abdominal pain is diffuse and moderate. Per husband states that patient became confused this afternoon, and has slurred speech and has been having tremor persistently. Patient does not have facial droop. Per her husband, patient does not seem to have chest pain, SOB, cough. Did not complain symptoms for UTI. No fever or chills. Pt's mental status has improved after giving 1 mg of Ativan for possible seizure. When I saw patient in ED, she is still confused, not oriented 3. She still has fine tremors all over.  ED Course: pt was found to have WBC 16.3, potassium 3.4, creatinine normal, temperature 99.5, tachycardia, tachypnea, oxygen saturation 93% on 2 L nasal cannula oxygen. CT head is a deviated study, but did not show acute intracranial issues. CT abdomen/pelvis is a not impressive. CTA of head and  neck showed CTA -chronic right MCA occlusion. Pt is admitted to SDU as inpt. Dr. Leonel Ramsay her for neurology was consulted.  Review of Systems: Could not be reviewed due to altered mental status  Allergy:  Allergies  Allergen Reactions  . Allopurinol Nausea And Vomiting    Past Medical History:  Diagnosis Date  . Arthritis    "back, hands" (07/22/2015)  . Cancer of left breast ([place]) 2006   S/P lumpectomy  . Chronic diastolic CHF (congestive heart failure) ([place]) 07/10/2017  . Complication of anesthesia    "brief breathing problem at surgery center in ~ 2005 when I had gallbladder OR"  . Concussion 03/2013   due to fall  . Depression   . DVT (deep venous thrombosis) ([place]) 03/2013   LLE  . GERD (gastroesophageal reflux disease)   . Hyperlipidemia   . Hypertension   . Hypothyroidism   . OSA treated with BiPAP   . Pulmonary embolism ([place]) 03/2013  . Seizures ([place])   . Thyroid disease    Hypothyroid  . Type II diabetes mellitus ([place])     Past Surgical History:  Procedure Laterality Date  . ABDOMINAL HYSTERECTOMY  1970s  . BREAST BIOPSY Left 2006  . BREAST LUMPECTOMY Left 2006  . BUNIONECTOMY WITH HAMMERTOE RECONSTRUCTION Left   . ESOPHAGOGASTRODUODENOSCOPY N/A 05/29/2017   Procedure: ESOPHAGOGASTRODUODENOSCOPY (EGD);  Surgeon: Irene Shipper, MD;  Location: [facility];  Service: Endoscopy;  Laterality: N/A;  . JOINT REPLACEMENT    . LAPAROSCOPIC CHOLECYSTECTOMY  ~ 2005  . TOTAL HIP ARTHROPLASTY Left 2010  . TOTAL KNEE ARTHROPLASTY Bilateral 2005-2006    Social History:  reports that she has quit smoking. Her  smoking use included cigarettes. She has a 30.00 pack-year smoking history. she has never used smokeless tobacco. She reports that she does not drink alcohol or use drugs.  Family History:  Family History  Problem Relation Age of Onset  . Diabetes Unknown   . Diabetes Mellitus II Sister      Prior to Admission medications   Medication Sig Start Date End Date  Taking? Authorizing Provider  acetaminophen (TYLENOL) 500 MG tablet Take 500 mg by mouth every 6 (six) hours as needed for mild pain.    [provider]  calcium-vitamin D (OSCAL 500/200 D-3) 500-200 MG-UNIT tablet Take 1 tablet 2 (two) times daily by mouth. 05/30/17 06/09/18  Barton Dubois, MD  DULoxetine (CYMBALTA) 60 MG capsule Take 60 mg by mouth daily.    [provider]  febuxostat (ULORIC) 40 MG tablet Take 40 mg daily by mouth.    [provider]  levETIRAcetam (KEPPRA) 750 MG tablet Take 1 tablet (750 mg total) by mouth 2 (two) times daily. 07/05/17   Love, Ivan Anchors, PA-C  levothyroxine (SYNTHROID, LEVOTHROID) 100 MCG tablet Take 100 mcg by mouth daily before breakfast.    [provider]  metFORMIN (GLUCOPHAGE) 500 MG tablet Take 500 mg by mouth 2 (two) times daily with a meal.     [provider]  metoCLOPramide (REGLAN) 10 MG tablet Take 1 tablet (10 mg total) by mouth every 8 (eight) hours as needed for nausea (nausea/headache). 07/10/17   Orlie Dakin, MD  metoCLOPramide (REGLAN) 10 MG tablet Take 1 tablet (10 mg total) by mouth every 8 (eight) hours as needed for nausea or vomiting. 07/10/17   Orlie Dakin, MD  metoprolol (TOPROL-XL) 200 MG 24 hr tablet Take 100 mg by mouth at bedtime.     [provider]  montelukast (SINGULAIR) 10 MG tablet Take 10 mg at bedtime by mouth.    [provider]  Multiple Vitamin (MULTIVITAMIN WITH MINERALS) TABS tablet Take 1 tablet daily by mouth.    [provider]  omeprazole (PRILOSEC) 40 MG capsule Take 40 mg by mouth 2 (two) times daily.    [provider]  ondansetron (ZOFRAN ODT) 4 MG disintegrating tablet Take 1 tablet (4 mg total) by mouth every 8 (eight) hours as needed for nausea or vomiting. 07/09/17   Horton, Barbette Hair, MD  QUEtiapine (SEROQUEL) 25 MG tablet Take 1 tablet (25 mg total) by mouth at bedtime. 07/05/17   Love, Ivan Anchors, PA-C  rivaroxaban  (XARELTO) 20 MG TABS tablet Take 1 tablet (20 mg total) by mouth daily with supper. 07/05/17   Love, Ivan Anchors, PA-C  spironolactone (ALDACTONE) 25 MG tablet Take 2 tablets (50 mg total) by mouth daily. 07/05/17   Bary Leriche, PA-C    Physical Exam: Vitals:   07/10/17 2325 07/10/17 2330 07/11/17 0000 07/11/17 0030  BP:  128/70 134/77 (!) 143/81  Pulse:  73 72 72  Resp:  20 (!) 27 17  Temp: 99 F (37.2 C)     TempSrc: Rectal     SpO2:  99% 100% 99%  Weight:      Height:       General: Not in acute distress HEENT:       Eyes: PERRL, EOMI, no scleral icterus.       ENT: No discharge from the ears and nose, no pharynx injection, no tonsillar enlargement.        Neck: No JVD, no bruit, no mass felt. Heme: No  neck lymph node enlargement. Cardiac: S1/S2, RRR, No murmurs, No gallops or rubs. Respiratory:  No rales, wheezing, rhonchi or rubs. GI: Soft, nondistended, has diffused tenderness, no organomegaly, BS present. GU: No hematuria Ext: No pitting leg edema bilaterally. 2+DP/PT pulse bilaterally. Musculoskeletal: No joint deformities, No joint redness or warmth, no limitation of ROM in spin. Skin: No rashes.  Neuro: confused, not oriented X3, cranial nerves II-XII grossly intact, moves all extremities. Psych: Patient is not psychotic.   Labs on Admission: I have personally reviewed following labs and imaging studies  CBC: Recent Labs  Lab 07/09/17 0053 07/10/17 1010 07/10/17 2031  WBC 9.2 16.3*  --   NEUTROABS 7.2 14.1*  --   HGB 11.9* 11.8* 12.2  HCT 36.5 35.5* 36.0  MCV 95.8 92.7  --   PLT 227 269  --    Basic Metabolic Panel: Recent Labs  Lab 07/09/17 0053 07/10/17 1010 07/10/17 2018 07/10/17 2031  NA 139 130*  --  131*  K 3.9 2.9*  --  3.4*  CL 95* 88*  --  85*  CO2 29 27  --   --   GLUCOSE 213* 189*  --  159*  BUN 15 8  --  8  CREATININE 1.02* 0.72  --  0.70  CALCIUM 7.5* 7.0*  --   --   MG  --   --  0.2*  --    GFR: Estimated Creatinine Clearance:  53.1 mL/min (by C-G formula based on SCr of 0.7 mg/dL). Liver Function Tests: Recent Labs  Lab 07/09/17 0053 07/10/17 1010  AST 15 18  ALT 10* 10*  ALKPHOS 92 72  BILITOT 0.8 1.1  PROT 6.3* 6.1*  ALBUMIN 3.6 3.6   No results for input(s): LIPASE, AMYLASE in the last 168 hours. Recent Labs  Lab 07/10/17 2008  AMMONIA 18   Coagulation Profile: Recent Labs  Lab 07/10/17 2008  INR 1.33   Cardiac Enzymes: Recent Labs  Lab 07/09/17 0053  TROPONINI <0.03   BNP (last 3 results) No results for input(s): PROBNP in the last 8760 hours. HbA1C: No results for input(s): HGBA1C in the last 72 hours. CBG: Recent Labs  Lab 07/04/17 1628 07/04/17 2130 07/05/17 0642 07/10/17 0934 07/10/17 1954  GLUCAP 120* 145* 165* 192* 175*   Lipid Profile: No results for input(s): CHOL, HDL, LDLCALC, TRIG, CHOLHDL, LDLDIRECT in the last 72 hours. Thyroid Function Tests: No results for input(s): TSH, T4TOTAL, FREET4, T3FREE, THYROIDAB in the last 72 hours. Anemia Panel: No results for input(s): VITAMINB12, FOLATE, FERRITIN, TIBC, IRON, RETICCTPCT in the last 72 hours. Urine analysis:    Component Value Date/Time   COLORURINE YELLOW 07/10/2017 2149   APPEARANCEUR CLEAR 07/10/2017 2149   LABSPEC 1.042 (H) 07/10/2017 2149   PHURINE 7.0 07/10/2017 2149   GLUCOSEU NEGATIVE 07/10/2017 2149   HGBUR NEGATIVE 07/10/2017 2149   BILIRUBINUR NEGATIVE 07/10/2017 2149   KETONESUR 5 (A) 07/10/2017 2149   PROTEINUR 100 (A) 07/10/2017 2149   UROBILINOGEN 0.2 03/31/2013 2006   NITRITE NEGATIVE 07/10/2017 2149   LEUKOCYTESUR NEGATIVE 07/10/2017 2149   Sepsis Labs: _0 (procalcitonin:4,lacticidven:4) )No results found for this or any previous visit (from the past 240 hour(s)).   Radiological Exams on Admission: Ct Angio Head W Or Wo Contrast  Result Date: 07/10/2017 CLINICAL DATA:  Nausea and vomiting.  Dizziness.  Aphasia. EXAM: CT ANGIOGRAPHY HEAD AND NECK TECHNIQUE: Multidetector CT  imaging of the head and neck was performed using the standard protocol during bolus administration of intravenous  contrast. Multiplanar CT image reconstructions and MIPs were obtained to evaluate the vascular anatomy. Carotid stenosis measurements (when applicable) are obtained utilizing NASCET criteria, using the distal internal carotid diameter as the denominator. CONTRAST:  48m ISOVUE-370 IOPAMIDOL (ISOVUE-370) INJECTION 76% COMPARISON:  Head CT earlier today.  Brain MRI 06/09/2017. FINDINGS: CTA NECK FINDINGS Aortic arch: Atherosclerosis. No acute finding. Four vessel branching. Right carotid system: Mild atherosclerotic plaque at the ICA bulb. No stenosis or dissection. Left carotid system: Mild atherosclerotic plaque at the ICA bulb. No stenosis or dissection. Vertebral arteries: The left vertebral artery arises from the arch. The vessels are smooth and diffusely patent. Skeleton: Advanced cervical disc degeneration.  Facet spurring. Other neck: No incidental mass or inflammation noted. Upper chest: No acute finding. Review of the MIP images confirms the above findings CTA HEAD FINDINGS Anterior circulation: There is extensive atherosclerotic plaque on the carotid siphons. Cut off at the origin of the right M1 vessel with prominent lenticulostriates and early reconstitution of under filled right MCA branches. On coronal MIPS, there is visible pial collaterals extending from the right ACA over the convexity. Suspect this is a chronic occlusion. In retrospect similar appearance seen on 06/09/2017 T2 weighted imaging. No left-sided major branch occlusion is seen. Aplastic left A1 segment. Posterior circulation: Atherosclerotic plaque on the bilateral vertebral arteries, extensive on the left where there is advanced narrowing, quantification limited by degree of calcified plaque. The basilar is diffusely patent. Fetal type left PCA with high-grade proximal P2 segment atheromatous narrowing. Venous sinuses:  Negative Anatomic variants: As above Delayed phase: Not obtained in the emergent setting Case discussed with Dr. KLeonel Ramsayvia telephone. Review of the MIP images confirms the above findings IMPRESSION: 1. 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 2. Extensive intracranial atherosclerosis. Advanced proximal left V4 and left PCA narrowing. 3. Atherosclerosis in the neck without flow limiting stenosis. Electronically Signed   By: JMonte FantasiaM.D.   On: 07/10/2017 20:11   Ct Angio Neck W Or Wo Contrast  Result Date: 07/10/2017 CLINICAL DATA:  Nausea and vomiting.  Dizziness.  Aphasia. EXAM: CT ANGIOGRAPHY HEAD AND NECK TECHNIQUE: Multidetector CT imaging of the head and neck was performed using the standard protocol during bolus administration of intravenous contrast. Multiplanar CT image reconstructions and MIPs were obtained to evaluate the vascular anatomy. Carotid stenosis measurements (when applicable) are obtained utilizing NASCET criteria, using the distal internal carotid diameter as the denominator. CONTRAST:  549mISOVUE-370 IOPAMIDOL (ISOVUE-370) INJECTION 76% COMPARISON:  Head CT earlier today.  Brain MRI 06/09/2017. FINDINGS: CTA NECK FINDINGS Aortic arch: Atherosclerosis. No acute finding. Four vessel branching. Right carotid system: Mild atherosclerotic plaque at the ICA bulb. No stenosis or dissection. Left carotid system: Mild atherosclerotic plaque at the ICA bulb. No stenosis or dissection. Vertebral arteries: The left vertebral artery arises from the arch. The vessels are smooth and diffusely patent. Skeleton: Advanced cervical disc degeneration.  Facet spurring. Other neck: No incidental mass or inflammation noted. Upper chest: No acute finding. Review of the MIP images confirms the above findings CTA HEAD FINDINGS Anterior circulation: There is extensive atherosclerotic plaque on the carotid  siphons. Cut off at the origin of the right M1 vessel with prominent lenticulostriates and early reconstitution of under filled right MCA branches. On coronal MIPS, there is visible pial collaterals extending from the right ACA over the convexity. Suspect this is a chronic occlusion. In retrospect similar appearance  seen on 06/09/2017 T2 weighted imaging. No left-sided major branch occlusion is seen. Aplastic left A1 segment. Posterior circulation: Atherosclerotic plaque on the bilateral vertebral arteries, extensive on the left where there is advanced narrowing, quantification limited by degree of calcified plaque. The basilar is diffusely patent. Fetal type left PCA with high-grade proximal P2 segment atheromatous narrowing. Venous sinuses: Negative Anatomic variants: As above Delayed phase: Not obtained in the emergent setting Case discussed with Dr. Leonel Ramsay via telephone. Review of the MIP images confirms the above findings IMPRESSION: 1. 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 2. Extensive intracranial atherosclerosis. Advanced proximal left V4 and left PCA narrowing. 3. Atherosclerosis in the neck without flow limiting stenosis. Electronically Signed   By: Monte Fantasia M.D.   On: 07/10/2017 20:11   Ct Abdomen Pelvis W Contrast  Result Date: 07/10/2017 CLINICAL DATA:  Nausea, vomiting. EXAM: CT ABDOMEN AND PELVIS WITH CONTRAST TECHNIQUE: Multidetector CT imaging of the abdomen and pelvis was performed using the standard protocol following bolus administration of intravenous contrast. CONTRAST:  185m ISOVUE-300 IOPAMIDOL (ISOVUE-300) INJECTION 61% COMPARISON:  05/27/2017 FINDINGS: Lower chest: Calcified granuloma in the right lower lobe. Heart is mildly enlarged. Hepatobiliary: Prior cholecystectomy.  No focal hepatic abnormality. Pancreas: No focal abnormality or ductal dilatation. Spleen: No focal  abnormality.  Normal size. Adrenals/Urinary Tract: No adrenal abnormality. No focal renal abnormality. No stones or hydronephrosis. Urinary bladder is unremarkable. Stomach/Bowel: Descending colonic and sigmoid diverticulosis. No active diverticulitis. Appendix is normal. Stomach and small bowel decompressed, unremarkable. Vascular/Lymphatic: Aorta and iliac vessels are heavily calcified. No evidence of aneurysm or adenopathy. Reproductive: Prior hysterectomy.  No adnexal masses. Other: No free fluid or free air. Musculoskeletal: Prior left hip replacement. Degenerative changes in the lumbar spine. No acute findings. IMPRESSION: Colonic diverticulosis.  No active diverticulitis. Normal appendix. Mild cardiomegaly. Aortoiliac atherosclerosis. No acute findings in the abdomen or pelvis. Electronically Signed   By: KRolm BaptiseM.D.   On: 07/10/2017 12:34   Ct Head Code Stroke Wo Contrast  Result Date: 07/10/2017 CLINICAL DATA:  Code stroke.  Nausea, vomiting, and dizziness. EXAM: CT HEAD WITHOUT CONTRAST TECHNIQUE: Contiguous axial images were obtained from the base of the skull through the vertex without intravenous contrast. COMPARISON:  06/19/2017 FINDINGS: Brain: No evidence of acute infarction, hemorrhage, hydrocephalus, extra-axial collection or mass lesion/mass effect. Chronic small vessel ischemia in the cerebral white matter. 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 Atrophy with variable sulcal widening. Vascular: Atherosclerotic calcification. Skull: Negative Sinuses/Orbits: No acute finding Other: These results were communicated to Dr. KLeonel Ramsayat 7[place]12/25/2018by text page via the [facility]messaging system. ASPECTS ([facility] Early CT Score) Not scored with this history. IMPRESSION: 1. Motion degraded study without acute finding. 2. Chronic small vessel disease. Electronically Signed   By: JMonte FantasiaM.D.   On: 07/10/2017 19:46     EKG: Independently reviewed.  Sinus rhythm, QTC 532, T-wave inversion in inferior leads and V4-V5  Assessment/Plan Principal Problem:   Status epilepticus (HCC) Active Problems:   DVT (deep venous thrombosis) (HCC)   Essential hypertension, benign   Hypothyroidism   Diabetes mellitus type 2, controlled ([place]   Hypocalcemia   Acute encephalopathy   History of pulmonary embolism   Nausea vomiting and diarrhea   Hypokalemia   Chronic diastolic CHF (congestive heart failure) (HCC)   Seizure (HCC)   OSA (obstructive sleep apnea)  Sepsis ([place])   Hypomagnesemia   Status epilepticus [facility]): Dr. Leonel Ramsay of neurology was consulted. Dr. Leonel Ramsay, pt likely has seizure activity and it is possible she was not keeping down her medications due to nausea vomiting and diarrhea. Pt is post ictal status and still confused, not oriented x 3  -will admit to SDU as inpt -highly appreciated Dr. Cecil Cobbs consultation, the follow-up recommendations 1) Keppra IV 1g now.  2) Keppra IV 743m BID following initial dose.  3) Once taking PO consistently, could restart PO keppra.  4) MRI brain - seizure precaution -When necessary Ativan for seizure -Frequent neuro check -hold all oral meds until mental status improves  Hx of DVT and PE: -switch Xarelto to IV heparin  Essential hypertension, benign: --hold all oral meds until mental status improves -IV metoprolol with holding parameters for heart rate less than 60 -IV hydralazine when necessary  Hypothyroidism: -switch Synthroid to IV, cut dose from 100-50 g daily  Diabetes mellitus type 2, controlled ([place]: Last A1c not in record. Patient is taking metformin at home -SSI -Check A1c  Electrolytes disturbance: Including hypokalemia, hypocalcemia and hypomagnesemia: -repleted all  Nausea vomiting, diarrhea and AP, and sepsis: Etiology for nausea, vomiting, diarrhea or abdominal pain is not clear. CT abdomen/pelvis is not impressive. Likely due to viral  gastroenteritis, but patient used antibiotics recently for possible aspiration pneumonia. Will need to rule out C. difficile colitis. Patient meets critical for sepsis with leukocytosis, tachycardia and tachypnea. Lactic acid is pending. Hemodynamically stable. - check C. difficile PCR -IVF: 1L and then 100 cc/h -will get Procalcitonin and trend lactic acid levels per sepsis protocol. -IVF: 1L of NS bolus in ED, followed by 100 cc/h  -f/u UA, Ux and Bx.  Chronic diastolic CHF (congestive heart failure) ([place]: 2-D echo on 04/02/13 showed EF of 55% with grade 1 diastolic dysfunction. Patient does not have leg edema JVD. CHF is compensated. -Hold spironolactone -Recheck BMP  OSA (obstructive sleep apnea): -hold CPAP until mental status improves  Abnormal EKG: EKG showed T-wave inversion in lead V4-V5 and in inferior leads.  -check trop x 3 -check A1c and FLP    DVT ppx: on IV heprin Code Status: Full code Family Communication:    Yes, patient's  Husband at bed side Disposition Plan:  Anticipate discharge back to previous home environment Consults called:  Dr. KLeonel Ramsayof neurology Admission status:  SDU/inpation       Date of Service 07/11/2017    [facility] Pager [phone] If 7PM-7AM, please contact night-coverage [email or web address] Password [email or web address] 07/11/2017, 1:09 AM

## 2017-07-10 NOTE — ED Notes (Signed)
ED Provider at bedside. 

## 2017-07-10 NOTE — Consult Note (Signed)
Neurology Consultation Reason for Consult: Left Sided weakness Referring Physician: Ellender Hose, C  CC: Altered Mental Status  History is obtained from:patient  HPI: Amanda Short is a 76 y.o. female with a history of recent admission with altered mental status felt to be due to prolonged seizure who presents with altered mental status in the setting of recent GI illness.   She was discharged following this on  12/8 on Keppra. She was doing well until she began having nausea/vomiting/diarrhea a few days ago. She then went to the ER earlier today where she was given fentanyl, reglan.   On the way home, she began to become confused.  She progressively continued to be very confused and therefore he brought her to the emergency department.  On my initial exam, she had persistent nystagmus, her speech was mostly nonsensical.  Did follow some commands, but was very altered.  Following 1 mg of IV Ativan.  Her speech began making sense, though she did continue making some errors.   LKW: 3:30 pm tpa given?: no, xarelto  ROS: Unable to obtain due to altered mental status.   Past Medical History:  Diagnosis Date  . Arthritis    "back, hands" (07/22/2015)  . Cancer of left breast ([place]) 2006   S/P lumpectomy  . Complication of anesthesia    "brief breathing problem at surgery center in ~ 2005 when I had gallbladder OR"  . Concussion 03/2013   due to fall  . Depression   . DVT (deep venous thrombosis) ([place]) 03/2013   LLE  . GERD (gastroesophageal reflux disease)   . Hyperlipidemia   . Hypertension   . Hypothyroidism   . OSA treated with BiPAP   . Pulmonary embolism ([place]) 03/2013  . Thyroid disease    Hypothyroid  . Type II diabetes mellitus ([facility])      Family History  Problem Relation Age of Onset  . Diabetes Unknown   . Diabetes Mellitus II Sister      Social History:  reports that she has quit smoking. Her smoking use included cigarettes. She has a 30.00 pack-year smoking history.  she has never used smokeless tobacco. She reports that she does not drink alcohol or use drugs.   Exam: Current vital signs: BP 134/70 (BP Location: Right Arm)   Pulse 69   Temp 98.1 F (36.7 C) (Oral)   Resp 18   Ht _0  (1.549 m)   Wt 68.7 kg (151 lb 7.3 oz)   SpO2 97%   BMI 28.62 kg/m  Vital signs in last 24 hours: Temp:  [97.7 F (36.5 C)-98.1 F (36.7 C)] 98.1 F (36.7 C) (12/25 1848) Pulse Rate:  [61-69] 69 (12/25 1848) Resp:  [13-23] 18 (12/25 1848) BP: (134-170)/(70-96) 134/70 (12/25 1848) SpO2:  [93 %-100 %] 97 % (12/25 1848) Weight:  [68.7 kg (151 lb 7.3 oz)-71.7 kg (158 lb)] 68.7 kg (151 lb 7.3 oz) (12/25 1927)   Physical Exam  Constitutional: Appears well-developed and well-nourished.  Psych: Affect appropriate to situation Eyes: No scleral injection HENT: No OP obstrucion Head: Normocephalic.  Cardiovascular: Normal rate and regular rhythm.  Respiratory: Effort normal, non-labored breathing GI: Soft.  No distension. There is no tenderness.  Skin: WDI  Neuro: Mental Status: Patient is awake, alert, oriented to person only, unable to repeat Cranial Nerves: II: She responds to visual stimuli bilaterally, but unable to count fingers correctly on the left. Pupils are equal, round, and reactive to light.   III,IV, VI: EOMI without ptosis  or diploplia.  V: Facial sensation is symmetric to temperature VII: Facial movement with mild left facial weakness VIII: hearing is intact to voice X: Uvula elevates symmetrically XI: Shoulder shrug is symmetric. XII: tongue is midline without atrophy or fasciculations.  Motor: Tone is normal. Bulk is normal. She has a mild left hemiparesis, 4+/5 Sensory: Sensation is symmetric to light touch, though she does not corrently answer DSS Cerebellar: Significant postural and intentional tremor bilaterally   I have reviewed labs in epic and the results pertinent to this consultation are: Creatinine 0.7 CBG 175  I have  reviewed the images obtained: CTA -chronic right MCA occlusion.  Impression: 76 year old female with altered mental status, nystagmus which improved markedly after IV Ativan.  This is similar to the activity found to be due to seizure last month.  I suspect that she was having seizure activity.  It is possible she was not keeping down her medications due to GI illness.   With some persistent symptoms(likely post-ictal) I do think a repeat MRI would be prudent.   Recommendations: 1) Keppra IV 1g now.  2) Keppra IV 750m BID following initial dose.  3) Once taking PO consistently, could restart PO keppra.  4) MRI brain 5) will follow.    MRoland Rack MD [facility] [phone] If 7pm- 7am, please page neurology on call as listed in [place]

## 2017-07-10 NOTE — ED Notes (Signed)
MD notified for Chem 8

## 2017-07-10 NOTE — ED Notes (Signed)
Pt placed on Purewick per [employer], corporate)

## 2017-07-10 NOTE — ED Notes (Signed)
EMS vitals 144/86BP 74P 96%2L 216CBG RR20

## 2017-07-10 NOTE — ED Provider Notes (Signed)
[facility] EMERGENCY DEPARTMENT Provider Note   CSN: [id number] Arrival date & time: 07/10/17  7026     History   Chief Complaint Chief Complaint  Patient presents with  . Emesis  . Diarrhea  . Near Syncope    HPI Amanda Short is a 76 y.o. female.  Complains of vomiting onset since leaving hospital 07/05/2017 multiple times to complete by crampy diffuse abdominal pain and intermittent diarrhea.  Other associated symptoms include lightheadedness.  She was seen here 07/08/2017 for same complaint prescribe Zofran which she took and continues to vomit.  Abdominal pain is diffuse intermittent crampy lasting probably 1 hour at a time.  Her husband reports he had 2 episodes of nonbloody diarrhea this morning.  Emesis is nonbloody and nonbilious.  No known fever.  No other associated symptoms.  Nothing makes symptoms better or worse  HPI  Past Medical History:  Diagnosis Date  . Arthritis    "back, hands" (07/22/2015)  . Cancer of left breast ([place]) 2006   S/P lumpectomy  . Complication of anesthesia    "brief breathing problem at surgery center in ~ 2005 when I had gallbladder OR"  . Concussion 03/2013   due to fall  . Depression   . DVT (deep venous thrombosis) ([place]) 03/2013   LLE  . GERD (gastroesophageal reflux disease)   . Hyperlipidemia   . Hypertension   . Hypothyroidism   . OSA treated with BiPAP   . Pulmonary embolism ([place]) 03/2013  . Thyroid disease    Hypothyroid  . Type II diabetes mellitus [facility])     Patient Active Problem List   Diagnosis Date Noted  . Seizure disorder ([place]) 07/05/2017  . Pulmonary HTN ([place]) 07/05/2017  . Anxiety state   . Physical debility 06/23/2017  . 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 History of acute respiratory failure   . Tracheostomy, acute management ([place])   . Aspiration pneumonia ([place])   . Encephalopathy   . Status epilepticus ([place])   . Benign essential HTN   . Diabetes mellitus type 2 in nonobese  (HCC)   . History of pulmonary embolism   . Acute blood loss anemia   . Diverticulitis of colon   . Tachypnea   . Encounter for orogastric (OG) tube placement   . Encounter for central line placement   . Encounter for attention to tracheostomy ([place])   . Acute respiratory failure with hypoxia ([place])   . Acute encephalopathy 06/06/2017  . Diverticulitis of large intestine without perforation or abscess without bleeding   . Abdominal pain   . Non-intractable cyclical vomiting with nausea   . Abnormal CT of the abdomen   . Hypocalcemia 05/27/2017  . 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 Colitis 05/27/2017  . Diabetic foot infection ([place]) 07/21/2015  . Diabetes mellitus type 2, controlled ([place]) 07/21/2015  . Cellulitis of foot, right 07/21/2015  . Cellulitis of right foot 07/21/2015  . Acute pulmonary embolism ([place]) 04/01/2013  . DVT (deep venous thrombosis) ([place]) 04/01/2013  . HX: breast cancer 04/01/2013  . Concussion 04/01/2013  . Diabetes ([place]) 04/01/2013  . Essential hypertension, benign 04/01/2013  . Unspecified hypothyroidism 04/01/2013  . OSA on CPAP 04/01/2013    Past Surgical History:  Procedure Laterality Date  . ABDOMINAL HYSTERECTOMY  1970s  . BREAST BIOPSY Left 2006  . BREAST LUMPECTOMY Left 2006  . BUNIONECTOMY WITH HAMMERTOE RECONSTRUCTION Left   . ESOPHAGOGASTRODUODENOSCOPY N/A 05/29/2017   Procedure: ESOPHAGOGASTRODUODENOSCOPY (EGD);  Surgeon: [name]  N, MD;  Location: [facility] ENDOSCOPY;  Service: Endoscopy;  Laterality: N/A;  . JOINT REPLACEMENT    . LAPAROSCOPIC CHOLECYSTECTOMY  ~ 2005  . TOTAL HIP ARTHROPLASTY Left 2010  . TOTAL KNEE ARTHROPLASTY Bilateral 2005-2006    OB History    No data available       Home Medications    Prior to Admission medications   Medication Sig Start Date End Date Taking? Authorizing Provider  calcium-vitamin D (OSCAL 500/200 D-3) 500-200 MG-UNIT tablet Take 1 tablet 2 (two) times daily by mouth. 05/30/17 06/09/18   Barton Dubois, MD  DULoxetine (CYMBALTA) 60 MG capsule Take 60 mg by mouth daily.    [provider]  febuxostat (ULORIC) 40 MG tablet Take 40 mg daily by mouth.    [provider]  levETIRAcetam (KEPPRA) 750 MG tablet Take 1 tablet (750 mg total) by mouth 2 (two) times daily. 07/05/17   Love, Ivan Anchors, PA-C  levothyroxine (SYNTHROID, LEVOTHROID) 100 MCG tablet Take 100 mcg by mouth daily before breakfast.    [provider]  metFORMIN (GLUCOPHAGE) 500 MG tablet Take 500 mg by mouth 2 (two) times daily with a meal.     [provider]  metoprolol (TOPROL-XL) 200 MG 24 hr tablet Take 100 mg by mouth at bedtime.     [provider]  montelukast (SINGULAIR) 10 MG tablet Take 10 mg at bedtime by mouth.    [provider]  Multiple Vitamin (MULTIVITAMIN WITH MINERALS) TABS tablet Take 1 tablet daily by mouth.    [provider]  omeprazole (PRILOSEC) 40 MG capsule Take 40 mg by mouth 2 (two) times daily.    [provider]  ondansetron (ZOFRAN ODT) 4 MG disintegrating tablet Take 1 tablet (4 mg total) by mouth every 8 (eight) hours as needed for nausea or vomiting. 07/09/17   Horton, Barbette Hair, MD  QUEtiapine (SEROQUEL) 25 MG tablet Take 1 tablet (25 mg total) by mouth at bedtime. 07/05/17   Love, Ivan Anchors, PA-C  rivaroxaban (XARELTO) 20 MG TABS tablet Take 1 tablet (20 mg total) by mouth daily with supper. 07/05/17   Love, Ivan Anchors, PA-C  spironolactone (ALDACTONE) 25 MG tablet Take 2 tablets (50 mg total) by mouth daily. 07/05/17   Bary Leriche, PA-C    Family History Family History  Problem Relation Age of Onset  . Diabetes Unknown   . Diabetes Mellitus II Sister     Social History Social History   Tobacco Use  . Smoking status: Former Smoker    Packs/day: 1.00    Years: 30.00    Pack years: 30.00    Types: Cigarettes  . Smokeless tobacco: Never Used  . Tobacco comment: "quit smoking cigarettes in the 1990s"    Substance Use Topics  . Alcohol use: No  . Drug use: No     Allergies   Allopurinol   Review of Systems Review of Systems  Respiratory: Positive for shortness of breath.        Chronic dyspnea, unchanged  Gastrointestinal: Positive for abdominal pain, diarrhea and nausea.  Allergic/Immunologic: Positive for immunocompromised state.  Neurological: Positive for weakness.       Generalized weakness  All other systems reviewed and are negative.    Physical Exam Updated Vital Signs Ht _0  (1.549 m)   Wt 71.7 kg (158 lb)   BMI 29.85 kg/m   Physical Exam  Constitutional:  Chronically and acutely ill-appearing  HENT:  Head: Normocephalic and atraumatic.  Eyes: Conjunctivae are normal. Pupils are equal, round, and reactive to light.  Neck: Neck supple. No tracheal deviation present. No thyromegaly present.  Tracheostomy inplace  Cardiovascular: Normal rate and regular rhythm.  No murmur heard. Pulmonary/Chest: Effort normal and breath sounds normal.  Abdominal: Soft. Bowel sounds are normal. She exhibits no distension. There is tenderness.  Mild diffuse tenderness  Musculoskeletal: Normal range of motion. She exhibits no edema or tenderness.  Neurological: She is alert. No cranial nerve deficit. Coordination normal.  Skin: Skin is warm and dry. No rash noted.  Psychiatric: She has a normal mood and affect.  Nursing note and vitals reviewed.    ED Treatments / Results  Labs (all labs ordered are listed, but only abnormal results are displayed) Labs Reviewed  CBG MONITORING, ED - Abnormal; Notable for the following components:      Result Value   Glucose-Capillary 192 (*)    All other components within normal limits  COMPREHENSIVE METABOLIC PANEL  CBC WITH DIFFERENTIAL/PLATELET  I-STAT TROPONIN, ED    EKG  EKG Interpretation  Date/Time:  Tuesday July 10 2017 09:49:52 EST Ventricular Rate:  63 PR Interval:    QRS Duration: 91 QT Interval:  518 QTC  Calculation: 531 R Axis:   -48 Text Interpretation:  Sinus rhythm LAD, consider left anterior fascicular block Abnormal T, consider ischemia, diffuse leads Prolonged QT interval Confirmed by Orlie Dakin [phone]) on 07/10/2017 9:55:06 AM     T wave changes in inferolateral leads new from previous tracing 03/31/13  Radiology Dg Abdomen Acute W/chest  Result Date: 07/09/2017 CLINICAL DATA:  Nausea, vomiting and diarrhea. EXAM: DG ABDOMEN ACUTE W/ 1V CHEST COMPARISON:  Chest radiograph 06/27/2017 FINDINGS: Cardiomediastinal contours are unchanged. Nodular opacity in the right lower lung is unchanged. No focal airspace consolidation or pulmonary edema. No pleural effusion or pneumothorax. 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 There is no free intraperitoneal air. Bowel gas pattern is normal. There is right convex lumbar scoliosis. Left total hip arthroplasty visualized portion is normal. IMPRESSION: No acute abnormality of the abdomen or pelvis. Electronically Signed   By: Ulyses Jarred M.D.   On: 07/09/2017 01:00    Procedures Procedures (including critical care time)  Medications Ordered in ED Medications  fentaNYL (SUBLIMAZE) injection 100 mcg (not administered)  sodium chloride 0.9 % bolus 1,000 mL (not administered)    Results for orders placed or performed during the hospital encounter of 07/10/17  Comprehensive metabolic panel  Result Value Ref Range   Sodium 130 (L) 135 - 145 mmol/L   Potassium 2.9 (L) 3.5 - 5.1 mmol/L   Chloride 88 (L) 101 - 111 mmol/L   CO2 27 22 - 32 mmol/L   Glucose, Bld 189 (H) 65 - 99 mg/dL   BUN 8 6 - 20 mg/dL   Creatinine, Ser 0.72 0.44 - 1.00 mg/dL   Calcium 7.0 (L) 8.9 - 10.3 mg/dL   Total Protein 6.1 (L) 6.5 - 8.1 g/dL   Albumin 3.6 3.5 - 5.0 g/dL   AST 18 15 - 41 U/L   ALT 10 (L) 14 - 54 U/L   Alkaline Phosphatase 72 38 - 126 U/L   Total Bilirubin 1.1 0.3 - 1.2 mg/dL   GFR calc non Af Amer >60 >60 mL/min   GFR calc Af  Amer >60 >60 mL/min   Anion gap 15 5 - 15  CBC with Differential/Platelet  Result Value Ref Range   WBC 16.3 (H) 4.0 - 10.5 K/uL   RBC  3.83 (L) 3.87 - 5.11 MIL/uL   Hemoglobin 11.8 (L) 12.0 - 15.0 g/dL   HCT 35.5 (L) 36.0 - 46.0 %   MCV 92.7 78.0 - 100.0 fL   MCH 30.8 26.0 - 34.0 pg   MCHC 33.2 30.0 - 36.0 g/dL   RDW 13.4 11.5 - 15.5 %   Platelets 269 150 - 400 K/uL   Neutrophils Relative % 87 %   Neutro Abs 14.1 (H) 1.7 - 7.7 K/uL   Lymphocytes Relative 9 %   Lymphs Abs 1.5 0.7 - 4.0 K/uL   Monocytes Relative 4 %   Monocytes Absolute 0.7 0.1 - 1.0 K/uL   Eosinophils Relative 0 %   Eosinophils Absolute 0.0 0.0 - 0.7 K/uL   Basophils Relative 0 %   Basophils Absolute 0.0 0.0 - 0.1 K/uL  CBG monitoring, ED  Result Value Ref Range   Glucose-Capillary 192 (H) 65 - 99 mg/dL   Comment 1 Notify RN    Comment 2 Document in Chart   I-stat troponin, ED  Result Value Ref Range   Troponin i, poc 0.00 0.00 - 0.08 ng/mL   Comment 3           Dg Chest 2 View  Result Date: 06/27/2017 CLINICAL DATA:  Cough for a few days.  History of tracheotomy. EXAM: CHEST  2 VIEW COMPARISON:  06/14/2017 FINDINGS: Small bilateral pleural effusion. No superimposed airspace opacity or edema. Chronic cardiomegaly. Calcified granuloma over the right lower lung. A tracheostomy tube remains well seated. Feeding tube and IJ catheter have been removed. 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 IMPRESSION: Small layering pleural effusions.  No noted pneumonia or edema. Electronically Signed   By: Monte Fantasia M.D.   On: 06/27/2017 11:12   Ct Head Wo Contrast  Result Date: 06/19/2017 CLINICAL DATA:  Initial evaluation for under witnessed fall. EXAM: CT HEAD WITHOUT CONTRAST CT CERVICAL SPINE WITHOUT CONTRAST TECHNIQUE: Multidetector CT imaging of the head and cervical spine was performed following the standard protocol without intravenous contrast. Multiplanar CT image  reconstructions of the cervical spine were also generated. COMPARISON:  Prior CT from 06/06/2017. FINDINGS: CT HEAD FINDINGS Brain: Atrophy with chronic small vessel ischemic disease. No acute intracranial hemorrhage. No evidence for acute large vessel territory infarct. No mass lesion, midline shift or mass effect. No hydrocephalus. No extra-axial fluid collection. Vascular: No hyperdense vessel. Scattered vascular calcifications noted within the carotid siphons. Skull: Scalp soft tissues and calvarium within normal limits. Sinuses/Orbits: Globes normal soft tissues normal. Paranasal sinuses clear. Small bilateral mastoid effusions. CT CERVICAL SPINE FINDINGS Alignment: Study degraded by motion artifact. Reversal of the normal cervical lordosis, apex at C5. Trace anterolisthesis of C7 on T1. Skull base and vertebrae: Skullbase intact. Normal C1-2 articulations are preserved in the dens is intact. Vertebral body heights maintained. No acute fracture. Soft tissues and spinal canal: Soft tissues of the neck demonstrate no acute abnormality. No prevertebral edema. Spinal canal within normal limits. Tracheostomy tube partially visualized. 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 Upper chest: Partially visualized upper chest without acute abnormality. Left IJ approach centra venous catheter partially visualized. Partially visualized lung apices grossly clear. IMPRESSION: 1. No acute intracranial process. 2. Moderate cerebral atrophy with chronic small vessel ischemic disease. 3. No acute traumatic injury within cervical spine. 4. 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 Electronically Signed   By: Jeannine Boga M.D.   On: 06/19/2017 04:58   Ct Cervical Spine Wo Contrast  Result Date: 06/19/2017 CLINICAL DATA:  Initial evaluation for under witnessed fall. EXAM: CT HEAD WITHOUT CONTRAST CT CERVICAL SPINE WITHOUT CONTRAST TECHNIQUE: Multidetector CT imaging of the  head and cervical spine was performed following the standard protocol without intravenous contrast. Multiplanar CT image reconstructions of the cervical spine were also generated. COMPARISON:  Prior CT from 06/06/2017. FINDINGS: CT HEAD FINDINGS Brain: Atrophy with chronic small vessel ischemic disease. No acute intracranial hemorrhage. No evidence for acute large vessel territory infarct. No mass lesion, midline shift or mass effect. No hydrocephalus. No extra-axial fluid collection. Vascular: No hyperdense vessel. Scattered vascular calcifications noted within the carotid siphons. Skull: Scalp soft tissues and calvarium within normal limits. Sinuses/Orbits: Globes normal soft tissues normal. Paranasal sinuses clear. Small bilateral mastoid effusions. CT CERVICAL SPINE FINDINGS Alignment: Study degraded by motion artifact. Reversal of the normal cervical lordosis, apex at C5. Trace anterolisthesis of C7 on T1. Skull base and vertebrae: Skullbase intact. Normal C1-2 articulations are preserved in the dens is intact. Vertebral body heights maintained. No acute fracture. Soft tissues and spinal canal: Soft tissues of the neck demonstrate no acute abnormality. No prevertebral edema. Spinal canal within normal limits. Tracheostomy tube partially visualized. 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 Upper chest: Partially visualized upper chest without acute abnormality. Left IJ approach centra venous catheter partially visualized. Partially visualized lung apices grossly clear. IMPRESSION: 1. No acute intracranial process. 2. Moderate cerebral atrophy with chronic small vessel ischemic disease. 3. No acute traumatic injury within cervical spine. 4. 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 Electronically Signed   By: Jeannine Boga M.D.   On: 06/19/2017 04:58   Ct Abdomen Pelvis W Contrast  Result Date: 07/10/2017 CLINICAL DATA:  Nausea, vomiting. EXAM: CT ABDOMEN  AND PELVIS WITH CONTRAST TECHNIQUE: Multidetector CT imaging of the abdomen and pelvis was performed using the standard protocol following bolus administration of intravenous contrast. CONTRAST:  187m ISOVUE-300 IOPAMIDOL (ISOVUE-300) INJECTION 61% COMPARISON:  05/27/2017 FINDINGS: Lower chest: Calcified granuloma in the right lower lobe. Heart is mildly enlarged. Hepatobiliary: Prior cholecystectomy.  No focal hepatic abnormality. Pancreas: No focal abnormality or ductal dilatation. Spleen: No focal abnormality.  Normal size. Adrenals/Urinary Tract: No adrenal abnormality. No focal renal abnormality. No stones or hydronephrosis. Urinary bladder is unremarkable. Stomach/Bowel: Descending colonic and sigmoid diverticulosis. No active diverticulitis. Appendix is normal. Stomach and small bowel decompressed, unremarkable. Vascular/Lymphatic: Aorta and iliac vessels are heavily calcified. No evidence of aneurysm or adenopathy. Reproductive: Prior hysterectomy.  No adnexal masses. Other: No free fluid or free air. Musculoskeletal: Prior left hip replacement. Degenerative changes in the lumbar spine. No acute findings. IMPRESSION: Colonic diverticulosis.  No active diverticulitis. Normal appendix. Mild cardiomegaly. Aortoiliac atherosclerosis. No acute findings in the abdomen or pelvis. Electronically Signed   By: KRolm BaptiseM.D.   On: 07/10/2017 12:34   Dg Chest Port 1 View  Result Date: 06/14/2017 CLINICAL DATA:  Pneumonia EXAM: PORTABLE CHEST 1 VIEW COMPARISON:  06/13/2017 FINDINGS: Tracheostomy remains in good position unchanged. Central venous catheter tip in the SVC is unchanged. No pneumothorax. Feeding tube has been placed in the interval and enters the stomach with the tip not visualized. Bibasilar airspace disease left greater than right is unchanged. Small left effusion. Negative for edema. IMPRESSION: No significant change from yesterday. Support lines remain in good position. Bibasilar airspace  disease left greater than right with small left effusion unchanged. Electronically Signed   By: CFranchot GalloM.D.   On: 06/14/2017 07:34  Dg Chest Port 1 View  Result Date: 06/13/2017 CLINICAL DATA:  Pneumonia EXAM: PORTABLE CHEST 1 VIEW COMPARISON:  06/12/2017 FINDINGS: Tracheostomy and left central line remain in place, unchanged. Cardiomegaly. Mild vascular congestion. Bibasilar opacities have improved since prior study. No visible effusions or acute bony abnormality. IMPRESSION: Improving bibasilar atelectasis or infiltrates. Cardiomegaly, vascular congestion. Electronically Signed   By: Rolm Baptise M.D.   On: 06/13/2017 09:38   Dg Chest Port 1 View  Result Date: 06/12/2017 CLINICAL DATA:  Tracheostomy tube placement EXAM: PORTABLE CHEST 1 VIEW COMPARISON:  06/10/2017 FINDINGS: Tracheostomy tube with the tip 4.5 cm above the carina. Left jugular central venous catheter at the confluence of the brachiocephalic vein and SVC. Small bilateral pleural effusions. Mild right basilar airspace disease likely reflecting atelectasis. No pneumothorax. Stable cardiomediastinal silhouette. No acute osseous abnormality. Severe osteoarthritis of bilateral glenohumeral joints. IMPRESSION: 1. Tracheostomy tube with the tip 4.5 cm above the carina. 2. Left jugular central venous catheter at the confluence of the brachiocephalic vein and SVC. Electronically Signed   By: Kathreen Devoid   On: 06/12/2017 10:48   Dg Chest Port 1 View  Result Date: 06/10/2017 CLINICAL DATA:  Central line placement EXAM: PORTABLE CHEST 1 VIEW COMPARISON:  06/07/2017 FINDINGS: Endotracheal tube with tip measuring 4.8 cm above the carina. Enteric tube tip is off the field of view but below the left hemidiaphragm. Left central venous catheter is positioned over the confluence of the brachiocephalic vein and superior vena cava with tip directed sideways, perpendicular to the vessel wall. Mild cardiac enlargement. No vascular congestion or  edema. No pneumothorax. Severe degenerative changes in the shoulders. IMPRESSION: Left central venous catheter projects over the confluence of the brachiocephalic vein and superior vena cava with tip directed laterally, possibly against the vascular side wall. Lungs are clear. No pneumothorax. Electronically Signed   By: Lucienne Capers M.D.   On: 06/10/2017 23:50   Dg Abdomen Acute W/chest  Result Date: 07/09/2017 CLINICAL DATA:  Nausea, vomiting and diarrhea. EXAM: DG ABDOMEN ACUTE W/ 1V CHEST COMPARISON:  Chest radiograph 06/27/2017 FINDINGS: Cardiomediastinal contours are unchanged. Nodular opacity in the right lower lung is unchanged. No focal airspace consolidation or pulmonary edema. No pleural effusion or pneumothorax. 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 There is no free intraperitoneal air. Bowel gas pattern is normal. There is right convex lumbar scoliosis. Left total hip arthroplasty visualized portion is normal. IMPRESSION: No acute abnormality of the abdomen or pelvis. Electronically Signed   By: Ulyses Jarred M.D.   On: 07/09/2017 01:00   Dg Swallowing Func-speech Pathology  Result Date: 06/17/2017 Objective Swallowing Evaluation: Type of Study: MBS-Modified Barium Swallow Study  Patient Details Name: Letrice Pollok MRN: [id number] Date of Birth: August 28, 1940 Today's Date: 06/17/2017 Time: SLP Start Time (ACUTE ONLY): 1155 -SLP Stop Time (ACUTE ONLY): 1220 SLP Time Calculation (min) (ACUTE ONLY): 25 min Past Medical History: Past Medical History: Diagnosis Date . Arthritis   "back, hands" (07/22/2015) . Cancer of left breast ([place]) 2006  S/P lumpectomy . Complication of anesthesia   "brief breathing problem at surgery center in ~ 2005 when I had gallbladder OR" . Depression  . DVT (deep venous thrombosis) ([employer]) 03/2013  LLE . GERD (gastroesophageal reflux disease)  . Hyperlipidemia  . Hypertension  . Hypothyroidism  . OSA treated with BiPAP  . Pulmonary embolism ([place]) 03/2013  . Thyroid disease   Hypothyroid . Type II diabetes mellitus ([place])  Past Surgical History: Past Surgical History: Procedure Laterality Date .  ABDOMINAL HYSTERECTOMY  1970s . BREAST BIOPSY Left 2006 . BREAST LUMPECTOMY Left 2006 . BUNIONECTOMY WITH HAMMERTOE RECONSTRUCTION Left  . ESOPHAGOGASTRODUODENOSCOPY N/A 05/29/2017  Procedure: ESOPHAGOGASTRODUODENOSCOPY (EGD);  Surgeon: Irene Shipper, MD;  Location: [facility];  Service: Endoscopy;  Laterality: N/A; . JOINT REPLACEMENT   . LAPAROSCOPIC CHOLECYSTECTOMY  ~ 2005 . TOTAL HIP ARTHROPLASTY Left 2010 . TOTAL KNEE ARTHROPLASTY Bilateral 2005-2006 HPI: 76 y.o.female with history of depression, DVT with pulmonary embolism, OSA, and diabetes mellitus type 2 presented on 11/21 with altered mentation, status elipticus.  MRI without obvious focal source. ETT 11/21; extubated 11/27, but with immediate stridor/vocal fold edema; unable to reintubate and required emergent trach.  Subjective: The patient was seen in radiology.  Assessment / Plan / Recommendation CHL IP CLINICAL IMPRESSIONS 06/17/2017 Clinical Impression MBS was completed using thin liquids, nectar thick liquids, honey thick liquids, pureed material and solids.  The patient presented with a mild pharyngeal dysphagia characterized by delayed initiation of the swallow which lead to flash penetration just prior to the swallow given thin and nectar thick liquids.  This material was observed to completely clear the laryngeal vestibule.  Chin tuck was attempted and ineffective to prevent.  Prominent cricopharyngeus was noted that was not causing any functional issues.  Esophageal sweep did not reveal overt issues.  Recommend a regular diet with thin liquids.  The patient needs to be totally upright for all intake and should take one, small sip at a time.  NO straws and medications should be whole in pureed material.  ST will follow up for therapeutic diet tolerance and swallowing therapy.   SLP Visit Diagnosis  Dysphagia, pharyngeal phase (R13.13) Attention and concentration deficit following -- Frontal lobe and executive function deficit following -- Impact on safety and function Mild aspiration risk   CHL IP TREATMENT RECOMMENDATION 06/17/2017 Treatment Recommendations Therapy as outlined in treatment plan below   Prognosis 06/17/2017 Prognosis for Safe Diet Advancement Good Barriers to Reach Goals -- Barriers/Prognosis Comment -- CHL IP DIET RECOMMENDATION 06/17/2017 SLP Diet Recommendations Regular solids;Thin liquid Liquid Administration via Cup;No straw Medication Administration Whole meds with puree Compensations Slow rate;Small sips/bites Postural Changes Seated upright at 90 degrees   CHL IP OTHER RECOMMENDATIONS 06/17/2017 Recommended Consults -- Oral Care Recommendations Oral care BID Other Recommendations --   CHL IP FOLLOW UP RECOMMENDATIONS 06/17/2017 Follow up Recommendations Inpatient Rehab   CHL IP FREQUENCY AND DURATION 06/17/2017 Speech Therapy Frequency (ACUTE ONLY) min 3x week Treatment Duration 2 weeks      CHL IP ORAL PHASE 06/17/2017 Oral Phase WFL Oral - Pudding Teaspoon -- Oral - Pudding Cup -- Oral - Honey Teaspoon -- Oral - Honey Cup -- Oral - Nectar Teaspoon -- Oral - Nectar Cup -- Oral - Nectar Straw -- Oral - Thin Teaspoon -- Oral - Thin Cup -- Oral - Thin Straw -- Oral - Puree -- Oral - Mech Soft -- Oral - Regular -- Oral - Multi-Consistency -- Oral - Pill -- Oral Phase - Comment --  CHL IP PHARYNGEAL PHASE 06/17/2017 Pharyngeal Phase Impaired Pharyngeal- Pudding Teaspoon -- Pharyngeal -- Pharyngeal- Pudding Cup -- Pharyngeal -- Pharyngeal- Honey Teaspoon -- Pharyngeal -- Pharyngeal- Honey Cup Delayed swallow initiation-vallecula Pharyngeal -- Pharyngeal- Nectar Teaspoon -- Pharyngeal -- Pharyngeal- Nectar Cup Delayed swallow initiation-pyriform sinuses Pharyngeal -- Pharyngeal- Nectar Straw -- Pharyngeal -- Pharyngeal- Thin Teaspoon Delayed swallow initiation-pyriform sinuses;Penetration/Aspiration  before swallow Pharyngeal Material enters airway, remains ABOVE vocal cords then ejected out Pharyngeal- Thin Cup Delayed swallow initiation-pyriform sinuses;Penetration/Aspiration  before swallow Pharyngeal Material enters airway, remains ABOVE vocal cords then ejected out Pharyngeal- Thin Straw Delayed swallow initiation-pyriform sinuses;Penetration/Aspiration before swallow Pharyngeal Material enters airway, remains ABOVE vocal cords then ejected out Pharyngeal- Puree -- Pharyngeal -- Pharyngeal- Mechanical Soft -- Pharyngeal -- Pharyngeal- Regular -- Pharyngeal -- Pharyngeal- Multi-consistency -- Pharyngeal -- Pharyngeal- Pill -- Pharyngeal -- Pharyngeal Comment --  Shelly Flatten, MA, CCC-SLP Acute Rehab SLP [phone] Lamar Sprinkles 06/17/2017, 12:35 PM              Initial Impression / Assessment and Plan / ED Course  I have reviewed the triage vital signs and the nursing notes.  Pertinent labs & imaging results that were available during my care of the patient were reviewed by me and considered in my medical decision making (see chart for details).     After treatment with intravenous hydration, intravenous Reglan and Tylenol and IV fentanyl patient feels improved and ready to go home.  She is able to drink water without vomiting.  Patient also received oral potassium supplementation while here Plan prescription Reglan.  Imodium for diarrhea.  Avoid dairy.  Encourage oral hydration.  Return  for  persistent vomiting.  Blood pressure recheck 1 week. Final Clinical Impressions(s) / ED Diagnoses  Dx #1 nausea vomiting diarrhea #2 hypokalemia  #3 hyperglycemia #4 elevated blood pressure Final diagnoses:  None    ED Discharge Orders    None       Orlie Dakin, MD 07/10/17 [id number]

## 2017-07-10 NOTE — ED Triage Notes (Signed)
Pt arrived EMS from home with c/o abdominal pain. Pt was recently discharged for same complaint.

## 2017-07-11 ENCOUNTER — Encounter (HOSPITAL_COMMUNITY): Payer: Self-pay

## 2017-07-11 ENCOUNTER — Inpatient Hospital Stay (HOSPITAL_COMMUNITY): Payer: Medicare Other

## 2017-07-11 DIAGNOSIS — G4733 Obstructive sleep apnea (adult) (pediatric): Secondary | ICD-10-CM

## 2017-07-11 HISTORY — DX: Hypomagnesemia: E83.42

## 2017-07-11 LAB — BASIC METABOLIC PANEL
ANION GAP: 14 (ref 5–15)
BUN: 5 mg/dL — ABNORMAL LOW (ref 6–20)
CALCIUM: 7 mg/dL — AB (ref 8.9–10.3)
CO2: 28 mmol/L (ref 22–32)
Chloride: 98 mmol/L — ABNORMAL LOW (ref 101–111)
Creatinine, Ser: 0.79 mg/dL (ref 0.44–1.00)
Glucose, Bld: 142 mg/dL — ABNORMAL HIGH (ref 65–99)
Potassium: 3.1 mmol/L — ABNORMAL LOW (ref 3.5–5.1)
SODIUM: 140 mmol/L (ref 135–145)

## 2017-07-11 LAB — HEMOGLOBIN A1C
Hgb A1c MFr Bld: 7 % — ABNORMAL HIGH (ref 4.8–5.6)
Mean Plasma Glucose: 154.2 mg/dL

## 2017-07-11 LAB — GLUCOSE, CAPILLARY
GLUCOSE-CAPILLARY: 120 mg/dL — AB (ref 65–99)
GLUCOSE-CAPILLARY: 116 mg/dL — AB (ref 65–99)
GLUCOSE-CAPILLARY: 115 mg/dL — AB (ref 65–99)
Glucose-Capillary: 157 mg/dL — ABNORMAL HIGH (ref 65–99)

## 2017-07-11 LAB — LIPID PANEL
Cholesterol: 131 mg/dL (ref 0–200)
HDL: 46 mg/dL (ref >40)
LDL CALC: 56 mg/dL (ref 0–99)
Total CHOL/HDL Ratio: 2.8 RATIO
Triglycerides: 144 mg/dL (ref <150)
VLDL: 29 mg/dL (ref 0–40)

## 2017-07-11 LAB — APTT
APTT: 132 s — AB (ref 24–36)
aPTT: <20 seconds — ABNORMAL LOW (ref 24–36)

## 2017-07-11 LAB — PROTIME-INR
INR: 1.11
PROTHROMBIN TIME: 14.2 s (ref 11.4–15.2)

## 2017-07-11 LAB — CBC
HCT: 35.9 % — ABNORMAL LOW (ref 36.0–46.0)
HEMOGLOBIN: 11.5 g/dL — AB (ref 12.0–15.0)
MCH: 30.7 pg (ref 26.0–34.0)
MCHC: 32 g/dL (ref 30.0–36.0)
MCV: 95.7 fL (ref 78.0–100.0)
Platelets: 260 10*3/uL (ref 150–400)
RBC: 3.75 MIL/uL — AB (ref 3.87–5.11)
RDW: 14 % (ref 11.5–15.5)
WBC: 9.5 10*3/uL (ref 4.0–10.5)

## 2017-07-11 LAB — TROPONIN I
TROPONIN I: 0.04 ng/mL — AB (ref <0.03)
TROPONIN I: 0.03 ng/mL — AB (ref <0.03)

## 2017-07-11 LAB — PROCALCITONIN: Procalcitonin: <0.1 ng/mL

## 2017-07-11 LAB — LACTIC ACID, PLASMA
LACTIC ACID, VENOUS: 1.2 mmol/L (ref 0.5–1.9)
Lactic Acid, Venous: 1.4 mmol/L (ref 0.5–1.9)

## 2017-07-11 LAB — HEPARIN LEVEL (UNFRACTIONATED): Heparin Unfractionated: 0.41 IU/mL (ref 0.30–0.70)

## 2017-07-11 LAB — MAGNESIUM
MAGNESIUM: 1.9 mg/dL (ref 1.7–2.4)
Magnesium: 1.8 mg/dL (ref 1.7–2.4)

## 2017-07-11 LAB — MRSA PCR SCREENING: MRSA BY PCR: NEGATIVE

## 2017-07-11 LAB — LIPASE, BLOOD: LIPASE: 17 U/L (ref 11–51)

## 2017-07-11 LAB — BRAIN NATRIURETIC PEPTIDE: B NATRIURETIC PEPTIDE 5: 1183.9 pg/mL — AB (ref 0.0–100.0)

## 2017-07-11 MED ORDER — POTASSIUM CHLORIDE 10 MEQ/100ML IV SOLN
10.0000 meq | INTRAVENOUS | Status: AC
  Administered 2017-07-11 (×2): 10 meq via INTRAVENOUS
  Filled 2017-07-11 (×2): qty 100

## 2017-07-11 MED ORDER — ORAL CARE MOUTH RINSE
15.0000 mL | Freq: Two times a day (BID) | OROMUCOSAL | Status: DC
  Administered 2017-07-11 – 2017-07-12 (×2): 15 mL via OROMUCOSAL

## 2017-07-11 MED ORDER — LORAZEPAM 2 MG/ML IJ SOLN: 1.0000 mg | Freq: Once | INTRAMUSCULAR | Status: DC

## 2017-07-11 MED ORDER — HEPARIN (PORCINE) IN NACL 100-0.45 UNIT/ML-% IJ SOLN
950.0000 [IU]/h | INTRAMUSCULAR | Status: AC
  Administered 2017-07-11: 1150 [IU]/h via INTRAVENOUS
  Administered 2017-07-11: 1000 [IU]/h via INTRAVENOUS
  Filled 2017-07-11 (×3): qty 250

## 2017-07-11 MED ORDER — MAGNESIUM SULFATE 2 GM/50ML IV SOLN
2.0000 g | Freq: Once | INTRAVENOUS | Status: AC
Start: 2017-07-11 — End: 2017-07-11
  Administered 2017-07-11: 2 g via INTRAVENOUS
  Filled 2017-07-11: qty 50

## 2017-07-11 MED ORDER — MAGNESIUM SULFATE 4 GM/100ML IV SOLN
4.0000 g | Freq: Once | INTRAVENOUS | Status: AC
  Administered 2017-07-11: 4 g via INTRAVENOUS
  Filled 2017-07-11: qty 100

## 2017-07-11 NOTE — ED Notes (Signed)
Patient transported to MRI 

## 2017-07-11 NOTE — Plan of Care (Signed)
No  acute events at this time.

## 2017-07-11 NOTE — Care Management Note (Signed)
Case Management Note  Patient Details  Name: Caleigha Zale MRN: [id number] Date of Birth: [birth year]  Subjective/Objective: from home with spouse,  presents with nausea, vomiting, diarrhea, abdominal pain, altered mental status and a seizure.                      Action/Plan: NCM will follow for dc needs.   Expected Discharge Date:                  Expected Discharge Plan:     In-House Referral:     Discharge planning Services  CM Consult  Post Acute Care Choice:    Choice offered to:     DME Arranged:    DME Agency:     HH Arranged:    HH Agency:     Status of Service:  In process, will continue to follow  If discussed at Long Length of Stay Meetings, dates discussed:    Additional Comments:  Zenon Mayo, RN 07/11/2017, 5:32 PM

## 2017-07-11 NOTE — Progress Notes (Signed)
Pt is on CPAP at this time. Pt is unaware of home settings. Adjusted settings per patient Comfort. Pt is tolerating It well. RN aware that patient is on CPAP QHS. Patient husband is at the bedside.

## 2017-07-11 NOTE — Progress Notes (Signed)
ANTICOAGULATION CONSULT NOTE - Initial Consult  Pharmacy Consult for Heparin Indication: h/o PE  Allergies  Allergen Reactions  . Allopurinol Nausea And Vomiting    Patient Measurements: Height: _0  (154.9 cm) Weight: 151 lb 7.3 oz (68.7 kg) IBW/kg (Calculated) : 47.8 Heparin Dosing Weight: 65 kg  Vital Signs: Temp: 99 F (37.2 C) (12/25 2325) Temp Source: Rectal (12/25 2325) BP: 143/81 (12/26 0030) Pulse Rate: 72 (12/26 0030)  Labs: Recent Labs    07/09/17 0053 07/10/17 1010 07/10/17 2008 07/10/17 2031  HGB 11.9* 11.8*  --  12.2  HCT 36.5 35.5*  --  36.0  PLT 227 269  --   --   APTT  --   --  27  --   LABPROT  --   --  16.3*  --   INR  --   --  1.33  --   CREATININE 1.02* 0.72  --  0.70  TROPONINI <0.03  --   --   --     Estimated Creatinine Clearance: 53.1 mL/min (by C-G formula based on SCr of 0.7 mg/dL).   Medical History: Past Medical History:  Diagnosis Date  . Arthritis    "back, hands" (07/22/2015)  . Cancer of left breast ([place]) 2006   S/P lumpectomy  . Chronic diastolic CHF (congestive heart failure) ([place]) 07/10/2017  . Complication of anesthesia    "brief breathing problem at surgery center in ~ 2005 when I had gallbladder OR"  . Concussion 03/2013   due to fall  . Depression   . DVT (deep venous thrombosis) ([place]) 03/2013   LLE  . GERD (gastroesophageal reflux disease)   . Hyperlipidemia   . Hypertension   . Hypothyroidism   . OSA treated with BiPAP   . Pulmonary embolism ([place]) 03/2013  . Seizures ([place])   . Thyroid disease    Hypothyroid  . Type II diabetes mellitus ([facility])     Medications:  Current Facility-Administered Medications on File Prior to Encounter  Medication Dose Route Frequency Provider Last Rate Last Dose  . [COMPLETED] acetaminophen (TYLENOL) tablet 1,000 mg  1,000 mg Oral Once Orlie Dakin, MD   1,000 mg at 07/10/17 1322  . [COMPLETED] fentaNYL (SUBLIMAZE) injection 100 mcg  100 mcg Intravenous Once Orlie Dakin,  MD   100 mcg at 07/10/17 1006  . [COMPLETED] iopamidol (ISOVUE-300) 61 % injection        100 mL at 07/10/17 1208  . [COMPLETED] metoCLOPramide (REGLAN) injection 5 mg  5 mg Intravenous Once Orlie Dakin, MD   5 mg at 07/10/17 1350  . [COMPLETED] potassium chloride SA (K-DUR,KLOR-CON) CR tablet 40 mEq  40 mEq Oral Once Orlie Dakin, MD   40 mEq at 07/10/17 1322  . [COMPLETED] sodium chloride 0.9 % bolus 1,000 mL  1,000 mL Intravenous Once Orlie Dakin, MD   Stopped at 07/10/17 1124   Current Outpatient Medications on File Prior to Encounter  Medication Sig Dispense Refill  . rivaroxaban (XARELTO) 20 MG TABS tablet Take 1 tablet (20 mg total) by mouth daily with supper. 30 tablet 0  . acetaminophen (TYLENOL) 500 MG tablet Take 500 mg by mouth every 6 (six) hours as needed for mild pain.    . calcium-vitamin D (OSCAL 500/200 D-3) 500-200 MG-UNIT tablet Take 1 tablet 2 (two) times daily by mouth. 20 tablet 0  . DULoxetine (CYMBALTA) 60 MG capsule Take 60 mg by mouth daily.    . febuxostat (ULORIC) 40 MG tablet Take 40 mg daily  by mouth.    . levETIRAcetam (KEPPRA) 750 MG tablet Take 1 tablet (750 mg total) by mouth 2 (two) times daily. 60 tablet 0  . levothyroxine (SYNTHROID, LEVOTHROID) 100 MCG tablet Take 100 mcg by mouth daily before breakfast.    . metFORMIN (GLUCOPHAGE) 500 MG tablet Take 500 mg by mouth 2 (two) times daily with a meal.     . metoCLOPramide (REGLAN) 10 MG tablet Take 1 tablet (10 mg total) by mouth every 8 (eight) hours as needed for nausea (nausea/headache). 12 tablet 0  . metoCLOPramide (REGLAN) 10 MG tablet Take 1 tablet (10 mg total) by mouth every 8 (eight) hours as needed for nausea or vomiting. 10 tablet 0  . metoprolol (TOPROL-XL) 200 MG 24 hr tablet Take 100 mg by mouth at bedtime.     . montelukast (SINGULAIR) 10 MG tablet Take 10 mg at bedtime by mouth.    . Multiple Vitamin (MULTIVITAMIN WITH MINERALS) TABS tablet Take 1 tablet daily by mouth.    Marland Kitchen  omeprazole (PRILOSEC) 40 MG capsule Take 40 mg by mouth 2 (two) times daily.    . ondansetron (ZOFRAN ODT) 4 MG disintegrating tablet Take 1 tablet (4 mg total) by mouth every 8 (eight) hours as needed for nausea or vomiting. 20 tablet 0  . QUEtiapine (SEROQUEL) 25 MG tablet Take 1 tablet (25 mg total) by mouth at bedtime. 30 tablet 0  . spironolactone (ALDACTONE) 25 MG tablet Take 2 tablets (50 mg total) by mouth daily.       Assessment: 76 y.o. female admitted with seiZures, h/o PE and Xarelto on hold, for heparin.  Last dose of Xarelto 12/24 Goal of Therapy:  APTT 66-102 Heparin level 0.3-0.7 units/ml Monitor platelets by anticoagulation protocol: Yes   Plan:  After baseline anticoagulation labs drawn, start heparin 1150 units/hr Check aPTT in 8 hours.   Amanda Short, Bronson Curb 07/11/2017,1:56 AM

## 2017-07-11 NOTE — Progress Notes (Signed)
ANTICOAGULATION CONSULT NOTE - Initial Consult  Pharmacy Consult for Heparin Indication: h/o PE  Allergies  Allergen Reactions  . Allopurinol Nausea And Vomiting    Patient Measurements: Height: _0  (154.9 cm) Weight: 150 lb 5.7 oz (68.2 kg) IBW/kg (Calculated) : 47.8 Heparin Dosing Weight: 65 kg  Assessment: 76 y.o. female admitted with seiZures, on Xarelto PTA for hx of PE. Holding on admission due to seizures. Transitioned to heparin. First aPTT is elevated at 132. Hgb 12.2, plts wnl.  Goal of Therapy:  APTT 66-102 Heparin level 0.3-0.7 units/ml Monitor platelets by anticoagulation protocol: Yes   Plan:  Reduce heparin gtt to 1,000 units/hr Monitor daily heparin level, CBC, s/s of bleed  Amanda Short J 07/11/2017,1:22 PM

## 2017-07-11 NOTE — Progress Notes (Addendum)
PROGRESS NOTE    Tesa Meadors   ZHY:[id number]  DOB: 1941/02/28  DOA: 07/10/2017 PCP: System, Pcp Not In   Brief Narrative:  Amanda Short is a 76 y.o. female with medical history significant of hypertension, hyperlipidemia, diabetes mellitus, GERD, hypothyroidism, gout, depression, seizure, DVT/PE on Xarelto, OSA on CPAP, left breast cancer 2006 (lumpectomy), dCHF, who presents with nausea, vomiting, diarrhea, abdominal pain, altered mental status and a seizure. 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 She was been vomiting for the past 4-5 days and has not been able to keep down her pills. She did not have fevers or chills and no localized abdominal pain.   Subjective: Nausea/ vomiting resolved. Last episode of diarrhea was 2 days ago. No abdominal pain.  ROS: no complaints of nausea, vomiting, constipation diarrhea, cough, dyspnea or dysuria. No other complaints.   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   Active Problems: Vomiting/ abdominal pain/ diarrhea and dehydration - CT abd/pelvis unrevealing - recently had diverticulitis but as mentioned, CT on 12/25 was unrevealing - symptoms appears to be resolved now- start liquids and follow - C diff pending but probability is low as there are no signs of colitis on CT     Hypokalemia/ hypocalcemia and Hypomagnesemia - replaced    DVT (deep venous thrombosis)   - on NOAC at home - currently on Heparin infusion    Essential hypertension, benign - Toprol, Spironolactone on hold on hold  GERD - cont PPI    Hypothyroidism - Synthroid    Diabetes mellitus type 2, controlled   - SSI    DVT prophylaxis: Heparin Code Status: full code Family  Communication:  Disposition Plan: home Consultants:   neuro Procedures:    Antimicrobials:  Anti-infectives (From admission, onward)   None       Objective: Vitals:   07/11/17 0319 07/11/17 0347 07/11/17 0402 07/11/17 0418  BP: (!) 184/55 130/63 (!) 151/70   Pulse: 74 81 72   Resp: 16 (!) 33 20   Temp: 97.7 F (36.5 C)  98 F (36.7 C)   TempSrc: Oral  Oral   SpO2: (!) 88% 98% 98%   Weight:    68.2 kg (150 lb 5.7 oz)  Height:        Intake/Output Summary (Last 24 hours) at 07/11/2017 1332 Last data filed at 07/11/2017 0820 Gross per 24 hour  Intake 2083.82 ml  Output 1950 ml  Net 133.82 ml   Filed Weights   07/10/17 1849 07/10/17 1927 07/11/17 0418  Weight: 71.7 kg (158 lb) 68.7 kg (151 lb 7.3 oz) 68.2 kg (150 lb 5.7 oz)    Examination: General exam: Appears comfortable  HEENT: PERRLA, oral mucosa moist, no sclera icterus or thrush Respiratory system: Clear to auscultation. Respiratory effort normal. Cardiovascular system: S1 & S2 heard, RRR.  No murmurs  Gastrointestinal system: Abdomen soft, non-tender, nondistended. Normal bowel sound. No organomegaly Central nervous system: Alert and oriented. No focal neurological deficits. Extremities: No cyanosis, clubbing or edema Skin: No rashes or ulcers Psychiatry:  Mood & affect appropriate.     Data Reviewed: I have personally reviewed following labs and imaging studies  CBC: Recent Labs  Lab 07/09/17 0053 07/10/17 1010 07/10/17 2031 07/11/17 1156  WBC 9.2 16.3*  --  9.5  NEUTROABS 7.2 14.1*  --   --   HGB 11.9* 11.8* 12.2 11.5*  HCT 36.5 35.5* 36.0 35.9*  MCV 95.8 92.7  --  95.7  PLT 227 269  --  119   Basic Metabolic Panel: Recent Labs  Lab 07/09/17 0053 07/10/17 1010 07/10/17 2018 07/10/17 2031 07/11/17 1156  NA 139 130*  --  131* 140  K 3.9 2.9*  --  3.4* 3.1*  CL 95* 88*  --  85* 98*  CO2 29 27  --   --  28  GLUCOSE 213* 189*  --  159* 142*  BUN 15 8  --  8 5*  CREATININE 1.02* 0.72   --  0.70 0.79  CALCIUM 7.5* 7.0*  --   --  7.0*  MG  --   --  0.2*  --  1.8  1.9   GFR: Estimated Creatinine Clearance: 52.9 mL/min (by C-G formula based on SCr of 0.79 mg/dL). Liver Function Tests: Recent Labs  Lab 07/09/17 0053 07/10/17 1010  AST 15 18  ALT 10* 10*  ALKPHOS 92 72  BILITOT 0.8 1.1  PROT 6.3* 6.1*  ALBUMIN 3.6 3.6   Recent Labs  Lab 07/11/17 0347  LIPASE 17   Recent Labs  Lab 07/10/17 2008  AMMONIA 18   Coagulation Profile: Recent Labs  Lab 07/10/17 2008 07/11/17 0347  INR 1.33 1.11   Cardiac Enzymes: Recent Labs  Lab 07/09/17 0053 07/11/17 0347 07/11/17 1156  TROPONINI <0.03 0.04* 0.03*   BNP (last 3 results) No results for input(s): PROBNP in the last 8760 hours. HbA1C: Recent Labs    07/11/17 0347  HGBA1C 7.0*   CBG: Recent Labs  Lab 07/05/17 0642 07/10/17 0934 07/10/17 1954 07/11/17 0853 07/11/17 1254  GLUCAP 165* 192* 175* 120* 116*   Lipid Profile: Recent Labs    07/11/17 0347  CHOL 131  HDL 46  LDLCALC 56  TRIG 144  CHOLHDL 2.8   Thyroid Function Tests: No results for input(s): TSH, T4TOTAL, FREET4, T3FREE, THYROIDAB in the last 72 hours. Anemia Panel: No results for input(s): VITAMINB12, FOLATE, FERRITIN, TIBC, IRON, RETICCTPCT in the last 72 hours. Urine analysis:    Component Value Date/Time   COLORURINE YELLOW 07/10/2017 2149   APPEARANCEUR CLEAR 07/10/2017 2149   LABSPEC 1.042 (H) 07/10/2017 2149   PHURINE 7.0 07/10/2017 2149   GLUCOSEU NEGATIVE 07/10/2017 2149   HGBUR NEGATIVE 07/10/2017 2149   [place] NEGATIVE 07/10/2017 2149   KETONESUR 5 (A) 07/10/2017 2149   PROTEINUR 100 (A) 07/10/2017 2149   UROBILINOGEN 0.2 03/31/2013 2006   NITRITE NEGATIVE 07/10/2017 2149   LEUKOCYTESUR NEGATIVE 07/10/2017 2149   Sepsis Labs: _0 (procalcitonin:4,lacticidven:4) ) Recent Results (from the past 240 hour(s))  MRSA PCR Screening     Status: None   Collection Time: 07/11/17  1:49 AM  Result  Value Ref Range Status   MRSA by PCR NEGATIVE NEGATIVE Final    Comment:        The GeneXpert MRSA Assay (FDA approved for NASAL specimens only), is one component of a comprehensive MRSA colonization surveillance program. It is not intended to diagnose MRSA infection nor to guide or monitor treatment for MRSA infections.          Radiology Studies: Ct Angio Head W Or Wo Contrast  Result Date: 07/10/2017 CLINICAL DATA:  Nausea and vomiting.  Dizziness.  Aphasia. EXAM: CT ANGIOGRAPHY  HEAD AND NECK TECHNIQUE: Multidetector CT imaging of the head and neck was performed using the standard protocol during bolus administration of intravenous contrast. Multiplanar CT image reconstructions and MIPs were obtained to evaluate the vascular anatomy. Carotid stenosis measurements (when applicable) are obtained utilizing NASCET criteria, using the distal internal carotid diameter as the denominator. CONTRAST:  20m ISOVUE-370 IOPAMIDOL (ISOVUE-370) INJECTION 76% COMPARISON:  Head CT earlier today.  Brain MRI 06/09/2017. FINDINGS: CTA NECK FINDINGS Aortic arch: Atherosclerosis. No acute finding. Four vessel branching. Right carotid system: Mild atherosclerotic plaque at the ICA bulb. No stenosis or dissection. Left carotid system: Mild atherosclerotic plaque at the ICA bulb. No stenosis or dissection. Vertebral arteries: The left vertebral artery arises from the arch. The vessels are smooth and diffusely patent. Skeleton: Advanced cervical disc degeneration.  Facet spurring. Other neck: No incidental mass or inflammation noted. Upper chest: No acute finding. Review of the MIP images confirms the above findings CTA HEAD FINDINGS Anterior circulation: There is extensive atherosclerotic plaque on the carotid siphons. Cut off at the origin of the right M1 vessel with prominent lenticulostriates and early reconstitution of under filled right MCA branches. On coronal MIPS, there is visible pial collaterals  extending from the right ACA over the convexity. Suspect this is a chronic occlusion. In retrospect similar appearance seen on 06/09/2017 T2 weighted imaging. No left-sided major branch occlusion is seen. Aplastic left A1 segment. Posterior circulation: Atherosclerotic plaque on the bilateral vertebral arteries, extensive on the left where there is advanced narrowing, quantification limited by degree of calcified plaque. The basilar is diffusely patent. Fetal type left PCA with high-grade proximal P2 segment atheromatous narrowing. Venous sinuses: Negative Anatomic variants: As above Delayed phase: Not obtained in the emergent setting Case discussed with Dr. KLeonel Ramsayvia telephone. Review of the MIP images confirms the above findings IMPRESSION: 1. 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 2. Extensive intracranial atherosclerosis. Advanced proximal left V4 and left PCA narrowing. 3. Atherosclerosis in the neck without flow limiting stenosis. Electronically Signed   By: JMonte FantasiaM.D.   On: 07/10/2017 20:11   Ct Angio Neck W Or Wo Contrast  Result Date: 07/10/2017 CLINICAL DATA:  Nausea and vomiting.  Dizziness.  Aphasia. EXAM: CT ANGIOGRAPHY HEAD AND NECK TECHNIQUE: Multidetector CT imaging of the head and neck was performed using the standard protocol during bolus administration of intravenous contrast. Multiplanar CT image reconstructions and MIPs were obtained to evaluate the vascular anatomy. Carotid stenosis measurements (when applicable) are obtained utilizing NASCET criteria, using the distal internal carotid diameter as the denominator. CONTRAST:  577mISOVUE-370 IOPAMIDOL (ISOVUE-370) INJECTION 76% COMPARISON:  Head CT earlier today.  Brain MRI 06/09/2017. FINDINGS: CTA NECK FINDINGS Aortic arch: Atherosclerosis. No acute finding. Four vessel branching. Right carotid system: Mild atherosclerotic plaque  at the ICA bulb. No stenosis or dissection. Left carotid system: Mild atherosclerotic plaque at the ICA bulb. No stenosis or dissection. Vertebral arteries: The left vertebral artery arises from the arch. The vessels are smooth and diffusely patent. Skeleton: Advanced cervical disc degeneration.  Facet spurring. Other neck: No incidental mass or inflammation noted. Upper chest: No acute finding. Review of the MIP images confirms the above findings CTA HEAD FINDINGS Anterior circulation: There is extensive atherosclerotic plaque on the carotid siphons. Cut off at the origin of the right M1 vessel with prominent lenticulostriates and early reconstitution of under filled right MCA branches. On coronal MIPS,  there is visible pial collaterals extending from the right ACA over the convexity. Suspect this is a chronic occlusion. In retrospect similar appearance seen on 06/09/2017 T2 weighted imaging. No left-sided major branch occlusion is seen. Aplastic left A1 segment. Posterior circulation: Atherosclerotic plaque on the bilateral vertebral arteries, extensive on the left where there is advanced narrowing, quantification limited by degree of calcified plaque. The basilar is diffusely patent. Fetal type left PCA with high-grade proximal P2 segment atheromatous narrowing. Venous sinuses: Negative Anatomic variants: As above Delayed phase: Not obtained in the emergent setting Case discussed with Dr. Leonel Ramsay via telephone. Review of the MIP images confirms the above findings IMPRESSION: 1. 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 2. Extensive intracranial atherosclerosis. Advanced proximal left V4 and left PCA narrowing. 3. Atherosclerosis in the neck without flow limiting stenosis. Electronically Signed   By: Monte Fantasia M.D.   On: 07/10/2017 20:11   Ct Abdomen Pelvis W Contrast  Result Date: 07/10/2017 CLINICAL DATA:   Nausea, vomiting. EXAM: CT ABDOMEN AND PELVIS WITH CONTRAST TECHNIQUE: Multidetector CT imaging of the abdomen and pelvis was performed using the standard protocol following bolus administration of intravenous contrast. CONTRAST:  11m ISOVUE-300 IOPAMIDOL (ISOVUE-300) INJECTION 61% COMPARISON:  05/27/2017 FINDINGS: Lower chest: Calcified granuloma in the right lower lobe. Heart is mildly enlarged. Hepatobiliary: Prior cholecystectomy.  No focal hepatic abnormality. Pancreas: No focal abnormality or ductal dilatation. Spleen: No focal abnormality.  Normal size. Adrenals/Urinary Tract: No adrenal abnormality. No focal renal abnormality. No stones or hydronephrosis. Urinary bladder is unremarkable. Stomach/Bowel: Descending colonic and sigmoid diverticulosis. No active diverticulitis. Appendix is normal. Stomach and small bowel decompressed, unremarkable. Vascular/Lymphatic: Aorta and iliac vessels are heavily calcified. No evidence of aneurysm or adenopathy. Reproductive: Prior hysterectomy.  No adnexal masses. Other: No free fluid or free air. Musculoskeletal: Prior left hip replacement. Degenerative changes in the lumbar spine. No acute findings. IMPRESSION: Colonic diverticulosis.  No active diverticulitis. Normal appendix. Mild cardiomegaly. Aortoiliac atherosclerosis. No acute findings in the abdomen or pelvis. Electronically Signed   By: KRolm BaptiseM.D.   On: 07/10/2017 12:34   Ct Head Code Stroke Wo Contrast  Result Date: 07/10/2017 CLINICAL DATA:  Code stroke.  Nausea, vomiting, and dizziness. EXAM: CT HEAD WITHOUT CONTRAST TECHNIQUE: Contiguous axial images were obtained from the base of the skull through the vertex without intravenous contrast. COMPARISON:  06/19/2017 FINDINGS: Brain: No evidence of acute infarction, hemorrhage, hydrocephalus, extra-axial collection or mass lesion/mass effect. Chronic small vessel ischemia in the cerebral white matter. 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 Atrophy with variable sulcal widening. Vascular: Atherosclerotic calcification. Skull: Negative Sinuses/Orbits: No acute finding Other: These results were communicated to Dr. KLeonel Ramsayat 7[place]12/25/2018by text page via the ADetroit (John D. Dingell) Va Medical Centermessaging system. ASPECTS ([facility] CenterStroke Program Early CT Score) Not scored with this history. IMPRESSION: 1. Motion degraded study without acute finding. 2. Chronic small vessel disease. Electronically Signed   By: JMonte FantasiaM.D.   On: 07/10/2017 19:46      Scheduled Meds: . insulin aspart  0-5 Units Subcutaneous QHS  . insulin aspart  0-9 Units Subcutaneous TID WC  . levothyroxine  50 mcg Intravenous Daily  . mouth rinse  15 mL Mouth Rinse BID  . metoprolol tartrate  5 mg Intravenous Q8H   Continuous Infusions: . heparin 1,150 Units/hr (07/11/17 0347)  . levETIRAcetam       LOS:  1 day    Time spent in minutes: 35    Debbe Odea, MD [facility] Pager: [email or web address] Password [email or web address] 07/11/2017, 1:32 PM

## 2017-07-11 NOTE — Progress Notes (Addendum)
Neurology Progress Note   S:// No acute events overnight  O:// Current vital signs: BP (!) 151/70   Pulse 72   Temp 98 F (36.7 C) (Oral)   Resp 20   Ht _0  (1.549 m)   Wt 68.2 kg (150 lb 5.7 oz)   SpO2 98%   BMI 28.41 kg/m  Vital signs in last 24 hours: Temp:  [97.7 F (36.5 C)-99.5 F (37.5 C)] 98 F (36.7 C) (12/26 0402) Pulse Rate:  [61-105] 72 (12/26 0402) Resp:  [13-33] 20 (12/26 0402) BP: (128-184)/(55-98) 151/70 (12/26 0402) SpO2:  [88 %-100 %] 98 % (12/26 0402) Weight:  [68.2 kg (150 lb 5.7 oz)-71.7 kg (158 lb)] 68.2 kg (150 lb 5.7 oz) (12/26 0418) Constitutional: Appears well-developed and well-nourished.  Psych: Affect appropriate to situation Eyes: No scleral injection HENT: No OP obstrucion Head: Normocephalic.  Cardiovascular: Normal rate and regular rhythm.  Respiratory: Effort normal, non-labored breathing GI: Soft.  No distension. There is no tenderness.  Skin: WDI Neuro: exam improved from last documented exam. Mental Status: Patient is awake, alert, orientedx2 Cranial Nerves: PERRL, EOMI, VFF, Face symmetric, hearing is intact to voice, Uvula elevates symmetrically, Shoulder shrug is symmetric.tongue is midline without atrophy or fasciculations.  Motor: Tone is normal. Bulk is normal. She has a mild left hemiparesis, 4+/5 Sensory: Sensation is symmetric to light touch. No extinction Cerebellar: Significant postural and intentional tremor bilaterally  Medications  Current Facility-Administered Medications:  .  acetaminophen (TYLENOL) tablet 650 mg, 650 mg, Oral, Q6H PRN **OR** acetaminophen (TYLENOL) suppository 650 mg, 650 mg, Rectal, Q6H PRN, Ivor Costa, MD .  heparin ADULT infusion 100 units/mL (25000 units/242m sodium chloride 0.45%), 1,150 Units/hr, Intravenous, Continuous, NIvor Costa MD, Last Rate: 11.5 mL/hr at 07/11/17 0347, 1,150 Units/hr at 07/11/17 0347 .  hydrALAZINE (APRESOLINE) injection 5 mg, 5 mg, Intravenous, Q2H PRN, NIvor Costa MD, 5 mg at 07/11/17 0319 .  hydrOXYzine (VISTARIL) injection 25 mg, 25 mg, Intramuscular, Q6H PRN, NIvor Costa MD .  insulin aspart (novoLOG) injection 0-5 Units, 0-5 Units, Subcutaneous, QHS, NIvor Costa MD .  insulin aspart (novoLOG) injection 0-9 Units, 0-9 Units, Subcutaneous, TID WC, NIvor Costa MD .  levETIRAcetam (KEPPRA) 750 mg in sodium chloride 0.9 % 100 mL IVPB, 750 mg, Intravenous, Q12H, NIvor Costa MD .  levothyroxine (SYNTHROID, LEVOTHROID) injection 50 mcg, 50 mcg, Intravenous, Daily, NIvor Costa MD .  LORazepam (ATIVAN) injection 1 mg, 1 mg, Intravenous, Q2H PRN, NIvor Costa MD .  magnesium sulfate IVPB 4 g 100 mL, 4 g, Intravenous, Once, Rizwan, Saima, MD .  MEDLINE mouth rinse, 15 mL, Mouth Rinse, BID, NIvor Costa MD .  metoprolol tartrate (LOPRESSOR) injection 5 mg, 5 mg, Intravenous, Q8H, NIvor Costa MD, 5 mg at 07/11/17 [phone].  zolpidem (AMBIEN) tablet 5 mg, 5 mg, Oral, QHS PRN, NIvor Costa MD Labs CBC    Component Value Date/Time   WBC 16.3 (H) 07/10/2017 1010   RBC 3.83 (L) 07/10/2017 1010   HGB 12.2 07/10/2017 2031   HCT 36.0 07/10/2017 2031   PLT 269 07/10/2017 1010   MCV 92.7 07/10/2017 1010   MCH 30.8 07/10/2017 1010   MCHC 33.2 07/10/2017 1010   RDW 13.4 07/10/2017 1010   LYMPHSABS 1.5 07/10/2017 1010   MONOABS 0.7 07/10/2017 1010   EOSABS 0.0 07/10/2017 1010   BASOSABS 0.0 07/10/2017 1010    CMP     Component Value Date/Time   NA 131 (L) 07/10/2017 2031   K 3.4 (L)  07/10/2017 2031   CL 85 (L) 07/10/2017 2031   CO2 27 07/10/2017 1010   GLUCOSE 159 (H) 07/10/2017 2031   BUN 8 07/10/2017 2031   CREATININE 0.70 07/10/2017 2031   CALCIUM 7.0 (L) 07/10/2017 1010   PROT 6.1 (L) 07/10/2017 1010   ALBUMIN 3.6 07/10/2017 1010   AST 18 07/10/2017 1010   ALT 10 (L) 07/10/2017 1010   ALKPHOS 72 07/10/2017 1010   BILITOT 1.1 07/10/2017 1010   GFRNONAA >60 07/10/2017 1010   GFRAA >60 07/10/2017 1010    Imaging I have reviewed images in epic  and the results pertinent to this consultation are: MRI pending  Assessment:  76 year old who had a recent admission in November for status epilepticus,  came in with altered mental status, nystagmus that has markedly improved. Her symptoms started improved after she received Ativan.  The symptoms recurred and she had to be given another dose of Ativan after which she had complete resolution of her symptoms. She is back to her baseline now.  Impression: Breakthrough seizure in the setting of nausea vomiting and inability to keep her medications down  Recommendations: Awaiting repeat MRI Continue with Keppra 750 twice daily IV till she passes swallow screen.  Can then be converted to 750 twice daily p.o. Maintain seizure precautions Management of nausea, vomiting, diarrhea and abdominal pain per primary team as you are. Will update note after MRI results become available.   -- Amie Portland, MD [facility] [phone] If 7pm to 7am, please call on call as listed on AMION.

## 2017-07-12 DIAGNOSIS — G40901 Epilepsy, unspecified, not intractable, with status epilepticus: Principal | ICD-10-CM

## 2017-07-12 DIAGNOSIS — G9340 Encephalopathy, unspecified: Secondary | ICD-10-CM

## 2017-07-12 LAB — BASIC METABOLIC PANEL
ANION GAP: 11 (ref 5–15)
Anion gap: 11 (ref 5–15)
Anion gap: 11 (ref 5–15)
BUN: 7 mg/dL (ref 6–20)
BUN: 7 mg/dL (ref 6–20)
CALCIUM: 6.8 mg/dL — AB (ref 8.9–10.3)
CHLORIDE: 100 mmol/L — AB (ref 101–111)
CO2: 29 mmol/L (ref 22–32)
CO2: 23 mmol/L (ref 22–32)
CO2: 25 mmol/L (ref 22–32)
CREATININE: 0.63 mg/dL (ref 0.44–1.00)
CREATININE: 0.71 mg/dL (ref 0.44–1.00)
Calcium: 8.3 mg/dL — ABNORMAL LOW (ref 8.9–10.3)
Calcium: 7.9 mg/dL — ABNORMAL LOW (ref 8.9–10.3)
Chloride: 100 mmol/L — ABNORMAL LOW (ref 101–111)
Chloride: 106 mmol/L (ref 101–111)
Creatinine, Ser: 0.7 mg/dL (ref 0.44–1.00)
GFR calc Af Amer: >60 mL/min (ref >60)
GFR calc Af Amer: >60 mL/min (ref >60)
GFR calc non Af Amer: >60 mL/min (ref >60)
GFR calc non Af Amer: >60 mL/min (ref >60)
GLUCOSE: 142 mg/dL — AB (ref 65–99)
GLUCOSE: 128 mg/dL — AB (ref 65–99)
Glucose, Bld: 176 mg/dL — ABNORMAL HIGH (ref 65–99)
POTASSIUM: 6.6 mmol/L — AB (ref 3.5–5.1)
Potassium: 2.8 mmol/L — ABNORMAL LOW (ref 3.5–5.1)
Potassium: 5.9 mmol/L — ABNORMAL HIGH (ref 3.5–5.1)
SODIUM: 136 mmol/L (ref 135–145)
Sodium: 140 mmol/L (ref 135–145)
Sodium: 140 mmol/L (ref 135–145)

## 2017-07-12 LAB — CBC
HCT: 34.7 % — ABNORMAL LOW (ref 36.0–46.0)
Hemoglobin: 11 g/dL — ABNORMAL LOW (ref 12.0–15.0)
MCH: 30.4 pg (ref 26.0–34.0)
MCHC: 31.7 g/dL (ref 30.0–36.0)
MCV: 95.9 fL (ref 78.0–100.0)
PLATELETS: 232 10*3/uL (ref 150–400)
RBC: 3.62 MIL/uL — AB (ref 3.87–5.11)
RDW: 14 % (ref 11.5–15.5)
WBC: 11.3 10*3/uL — ABNORMAL HIGH (ref 4.0–10.5)

## 2017-07-12 LAB — HEPARIN LEVEL (UNFRACTIONATED): Heparin Unfractionated: 0.82 IU/mL — ABNORMAL HIGH (ref 0.30–0.70)

## 2017-07-12 LAB — GLUCOSE, CAPILLARY
GLUCOSE-CAPILLARY: 202 mg/dL — AB (ref 65–99)
GLUCOSE-CAPILLARY: 112 mg/dL — AB (ref 65–99)
Glucose-Capillary: 134 mg/dL — ABNORMAL HIGH (ref 65–99)

## 2017-07-12 LAB — APTT: aPTT: 101 seconds — ABNORMAL HIGH (ref 24–36)

## 2017-07-12 LAB — MAGNESIUM: MAGNESIUM: 1.7 mg/dL (ref 1.7–2.4)

## 2017-07-12 MED ORDER — POTASSIUM CHLORIDE CRYS ER 20 MEQ PO TBCR
40.0000 meq | EXTENDED_RELEASE_TABLET | ORAL | Status: AC
  Administered 2017-07-12 (×2): 40 meq via ORAL
  Filled 2017-07-12 (×2): qty 2

## 2017-07-12 MED ORDER — QUETIAPINE FUMARATE 25 MG PO TABS: 25.0000 mg | ORAL_TABLET | Freq: Every day | ORAL | Status: DC

## 2017-07-12 MED ORDER — MONTELUKAST SODIUM 10 MG PO TABS: 10.0000 mg | ORAL_TABLET | Freq: Every day | ORAL | Status: DC

## 2017-07-12 MED ORDER — HYDRALAZINE HCL 20 MG/ML IJ SOLN: 10.0000 mg | INTRAMUSCULAR | Status: DC | PRN

## 2017-07-12 MED ORDER — MAGNESIUM SULFATE 2 GM/50ML IV SOLN
2.0000 g | Freq: Once | INTRAVENOUS | Status: AC
Start: 2017-07-12 — End: 2017-07-12
  Administered 2017-07-12: 2 g via INTRAVENOUS
  Filled 2017-07-12: qty 50

## 2017-07-12 MED ORDER — SPIRONOLACTONE 50 MG PO TABS
50.0000 mg | ORAL_TABLET | Freq: Every day | ORAL | Status: DC
  Administered 2017-07-12: 50 mg via ORAL
  Filled 2017-07-12: qty 1

## 2017-07-12 MED ORDER — METOPROLOL SUCCINATE ER 100 MG PO TB24: 100.0000 mg | ORAL_TABLET | Freq: Every day | ORAL | Status: DC

## 2017-07-12 MED ORDER — POTASSIUM CHLORIDE 10 MEQ/100ML IV SOLN
10.0000 meq | INTRAVENOUS | Status: AC
  Administered 2017-07-12 (×4): 10 meq via INTRAVENOUS
  Filled 2017-07-12 (×4): qty 100

## 2017-07-12 MED ORDER — LEVETIRACETAM 750 MG PO TABS
750.0000 mg | ORAL_TABLET | Freq: Two times a day (BID) | ORAL | Status: DC
  Administered 2017-07-12: 750 mg via ORAL
  Filled 2017-07-12: qty 1

## 2017-07-12 MED ORDER — RIVAROXABAN 20 MG PO TABS
20.0000 mg | ORAL_TABLET | Freq: Every day | ORAL | Status: DC
  Administered 2017-07-12: 20 mg via ORAL
  Filled 2017-07-12: qty 1

## 2017-07-12 MED ORDER — SODIUM CHLORIDE 0.9 % IV SOLN
1.0000 g | Freq: Once | INTRAVENOUS | Status: AC
  Administered 2017-07-12: 1 g via INTRAVENOUS
  Filled 2017-07-12: qty 10

## 2017-07-12 MED ORDER — DULOXETINE HCL 60 MG PO CPEP
60.0000 mg | ORAL_CAPSULE | Freq: Every day | ORAL | Status: DC
  Administered 2017-07-12: 60 mg via ORAL
  Filled 2017-07-12: qty 1

## 2017-07-12 MED ORDER — METOPROLOL SUCCINATE ER 100 MG PO TB24
100.0000 mg | ORAL_TABLET | Freq: Every day | ORAL | Status: DC
  Administered 2017-07-12: 100 mg via ORAL
  Filled 2017-07-12: qty 1

## 2017-07-12 MED ORDER — LEVOTHYROXINE SODIUM 100 MCG PO TABS
100.0000 ug | ORAL_TABLET | Freq: Every day | ORAL | Status: DC
  Administered 2017-07-12: 100 ug via ORAL
  Filled 2017-07-12: qty 1

## 2017-07-12 NOTE — Progress Notes (Signed)
RN reviewed discharge summary with patient and patients husband. RN reviewed follow up instructions with patient to see a neurologist within 2 weeks for a repeat MRI, both pt and husband verbalized understanding of instructions. RN reviewed medication list with patient. Pt stated she needed a prescription for Keppra. Writer paged on-call provider, who stated she would call in a 2 week prescription to the pts pharmacy ([facility] on [address]) for pt to pick up after discharge. RN removed both PIVs from right arm with no complications. Pt left the unit via wheelchair with all belongings in hand and no further questions.

## 2017-07-12 NOTE — Discharge Instructions (Signed)
YOUR POTASSIUM IS A LITTLE HIGH. PLEASE DO NOT TAKE YOUR ALDACTONE TOMORROW BUT RESUME IT THE FOLLOWING DAY.   Please take all your medications with you for your next visit with your Primary MD. Please request your Primary MD to go over all hospital test results at the follow up. Please ask your Primary MD to get all Hospital records sent to his/her office.  If you experience worsening of your admission symptoms, develop shortness of breath, chest pain, suicidal or homicidal thoughts or a life threatening emergency, you must seek medical attention immediately by calling 911 or calling your MD.  Dennis Bast must read the complete instructions/literature along with all the possible adverse reactions/side effects for all the medicines you take including new medications that have been prescribed to you. Take new medicines after you have completely understood and accpet all the possible adverse reactions/side effects.   Do not drive when taking pain medications or sedatives.    Do not take more than prescribed Pain, Sleep and Anxiety Medications  If you have smoked or chewed Tobacco in the last 2 yrs please stop. Stop any regular alcohol and or recreational drug use.  Wear Seat belts while driving.

## 2017-07-12 NOTE — Progress Notes (Signed)
Neurology Progress Note   S:// Patient was upset about frequent neuro checks overnight and seemed disoriented and unaware of the fact that she had been hospitalized for seizures.  She is now back to her baseline and understands why she is here. She says that she was woken every 2 hours for exams and she feels very frustrated because of that.  In terms of her nausea vomiting abdominal pain-she feels better.   O:// Current vital signs: BP (!) 172/79   Pulse 76   Temp 97.9 F (36.6 C) (Oral)   Resp (!) 24   Ht _0  (1.549 m)   Wt 68.2 kg (150 lb 5.7 oz)   SpO2 99%   BMI 28.41 kg/m  Vital signs in last 24 hours: Temp:  [97.9 F (36.6 C)-98.7 F (37.1 C)] 97.9 F (36.6 C) (12/27 0800) Pulse Rate:  [65-90] 76 (12/27 0839) Resp:  [19-29] 24 (12/27 0517) BP: (108-184)/(51-114) 172/79 (12/27 0839) SpO2:  [93 %-100 %] 99 % (12/27 0517) Weight:  [68.2 kg (150 lb 5.7 oz)] 68.2 kg (150 lb 5.7 oz) (12/27 0517) Constitutional: Appears well-developed and well-nourished.  Psych: Affect appropriate to situation Eyes: No scleral injection HENT: No OP obstrucion Head: Normocephalic.  Cardiovascular: Normal rate and regular rhythm.  Respiratory: Effort normal, non-labored breathing GI: Soft. No distension. There is no tenderness.  Skin: WDI Neuro: exam improved from last documented exam. Mental Status: Patient is awake, alert, orientedx3 Cranial Nerves: PERRL, EOMI, VFF, Face symmetric, hearing is intact to voice, Uvula elevates symmetrically, Shoulder shrug is symmetric.tongue is midline without atrophy or fasciculations.  Motor: Tone is normal. Bulk is normal. Grossly symmetric antigravity strength in bilateral upper and lower extremities. Sensory: Sensation is symmetric to light touch. No extinction Cerebellar: Significant postural and intentional tremor bilaterally  Medications  Current Facility-Administered Medications:  .  acetaminophen (TYLENOL) tablet 650 mg, 650 mg, Oral, Q6H  PRN **OR** acetaminophen (TYLENOL) suppository 650 mg, 650 mg, Rectal, Q6H PRN, Ivor Costa, MD .  calcium gluconate 1 g in sodium chloride 0.9 % 100 mL IVPB, 1 g, Intravenous, Once, Greta Doom, MD, Last Rate: 110 mL/hr at 07/12/17 0842, 1 g at 07/12/17 0842 .  calcium gluconate 1 g in sodium chloride 0.9 % 100 mL IVPB, 1 g, Intravenous, Once, Rizwan, Saima, MD .  DULoxetine (CYMBALTA) DR capsule 60 mg, 60 mg, Oral, Daily, Rizwan, Saima, MD, 60 mg at 07/12/17 0841 .  hydrALAZINE (APRESOLINE) injection 5 mg, 5 mg, Intravenous, Q2H PRN, Ivor Costa, MD, 5 mg at 07/12/17 0114 .  insulin aspart (novoLOG) injection 0-5 Units, 0-5 Units, Subcutaneous, QHS, Niu, Xilin, MD .  insulin aspart (novoLOG) injection 0-9 Units, 0-9 Units, Subcutaneous, TID WC, Ivor Costa, MD, 3 Units at 07/12/17 [phone] .  levETIRAcetam (KEPPRA) tablet 750 mg, 750 mg, Oral, BID, Rizwan, Saima, MD, 750 mg at 07/12/17 0841 .  levothyroxine (SYNTHROID, LEVOTHROID) tablet 100 mcg, 100 mcg, Oral, QAC breakfast, Debbe Odea, MD, 100 mcg at 07/12/17 0840 .  LORazepam (ATIVAN) injection 1 mg, 1 mg, Intravenous, Q2H PRN, Ivor Costa, MD .  LORazepam (ATIVAN) injection 1 mg, 1 mg, Intravenous, Once, Schorr, Rhetta Mura, NP, Stopped at 07/12/17 0025 .  MEDLINE mouth rinse, 15 mL, Mouth Rinse, BID, Ivor Costa, MD, 15 mL at 07/11/17 2135 .  metoprolol succinate (TOPROL-XL) 24 hr tablet 100 mg, 100 mg, Oral, Daily, Rizwan, Saima, MD, 100 mg at 07/12/17 0839 .  montelukast (SINGULAIR) tablet 10 mg, 10 mg, Oral, QHS, Debbe Odea, MD .  potassium chloride 10 mEq in 100 mL IVPB, 10 mEq, Intravenous, Q1 Hr x 4, Rizwan, Saima, MD, Last Rate: 100 mL/hr at 07/12/17 0842, 10 mEq at 07/12/17 0842 .  potassium chloride SA (K-DUR,KLOR-CON) CR tablet 40 mEq, 40 mEq, Oral, Q4H, Rizwan, Saima, MD, 40 mEq at 07/12/17 0839 .  rivaroxaban (XARELTO) tablet 20 mg, 20 mg, Oral, Q supper, Rizwan, Saima, MD, 20 mg at 07/12/17 0841 .  zolpidem (AMBIEN)  tablet 5 mg, 5 mg, Oral, QHS PRN, Ivor Costa, MD, 5 mg at 07/11/17 2134 Labs CBC    Component Value Date/Time   WBC 11.3 (H) 07/12/2017 0423   RBC 3.62 (L) 07/12/2017 0423   HGB 11.0 (L) 07/12/2017 0423   HCT 34.7 (L) 07/12/2017 0423   PLT 232 07/12/2017 0423   MCV 95.9 07/12/2017 0423   MCH 30.4 07/12/2017 0423   MCHC 31.7 07/12/2017 0423   RDW 14.0 07/12/2017 0423   LYMPHSABS 1.5 07/10/2017 1010   MONOABS 0.7 07/10/2017 1010   EOSABS 0.0 07/10/2017 1010   BASOSABS 0.0 07/10/2017 1010    CMP     Component Value Date/Time   NA 140 07/12/2017 0423   K 2.8 (L) 07/12/2017 0423   CL 100 (L) 07/12/2017 0423   CO2 29 07/12/2017 0423   GLUCOSE 176 (H) 07/12/2017 0423   BUN <5 (L) 07/12/2017 0423   CREATININE 0.63 07/12/2017 0423   CALCIUM 6.8 (L) 07/12/2017 0423   PROT 6.1 (L) 07/10/2017 1010   ALBUMIN 3.6 07/10/2017 1010   AST 18 07/10/2017 1010   ALT 10 (L) 07/10/2017 1010   ALKPHOS 72 07/10/2017 1010   BILITOT 1.1 07/10/2017 1010   GFRNONAA >60 07/12/2017 0423   GFRAA >60 07/12/2017 0423   Imaging No acute findings on noncontrast CT of the head on admission. 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  Extensive intracranial atherosclerosis.  Advanced proximal left V4 and left PCA narrowing.  Atherosclerosis in the neck without any flow-limiting stenoses. Most recent MRIs from November.  Showed no acute abnormality.  Chronic white matter disease.  Slightly asymmetric volume loss more on the left.  Assessment:  76/F with who was seen in November with status epilepticus, brought in 2d ago with nausea, vomiting and altered mental status which on exam appears to be possible seizure Possibly in the setting of acute GI illness and inability to absorb PO AEDs Doing much better today with regards to her GI illness subjectively. Continues to have some electrolyte abnormalities which are at this time being corrected.  Impression: Breakthrough seizure  in the setting of acute GI illness and electrolyte abnormalities Known h/o seizures  Recommendations: Pending MRI-I would like to get a repeat MRI since cause of her seizures is unknown and the prior MRI showed only chronic white matter disease and asymmetric atrophy on the left cerebral hemisphere.  Would like to rule out a structural cause for seizures if found. If for some reason it is not able to be performed and patient is ready to be discharged, this can be done as an outpatient and should be followed up at the outpatient neurology appointment, which should be made within 2 weeks after discharge. Continue with Keppra 750 twice daily. Management of nausea vomiting diarrhea abdominal pain and electrolyte derangements per primary team as you are. Can de-escalate neurochecks to q4h with no checks from midnight to 6am to give her time to sleep as requested by her.  -- Amie Portland, MD [facility]  [phone] If 7pm to 7am, please call on call as listed on AMION.

## 2017-07-12 NOTE — Progress Notes (Signed)
Lab was unable to draw required for patient x 3. Shorr and pharmacy notified.  No new orders given.

## 2017-07-12 NOTE — Discharge Summary (Addendum)
Physician Discharge Summary  Amanda Short KZL:[id number] DOB: 1941-05-10 DOA: 07/10/2017  PCP: System, Pcp Not In  Admit date: 07/10/2017 Discharge date: 07/12/2017  Admitted From: home Disposition:  home   Recommendations for Outpatient Follow-up:  1. outpt MRI recommended by neuro- f/u with Neuro in 2 wks  Discharge Condition:  stable   CODE STATUS:  Full code   Consultations:  neuro    Discharge Diagnoses:  Principal Problem: Breakthrough seizure Active Problems:   Hypocalcemia/ hypokalemia/ hypomagnesemia    Acute encephalopathy   Dehydration   Nausea vomiting and diarrhea   Hypokalemia   Chronic diastolic CHF (congestive heart failure) (HCC)   OSA (obstructive sleep apnea)   Sepsis (HCC)   Hypomagnesemia   DVT (deep venous thrombosis) ([place])   Essential hypertension, benign   Hypothyroidism   Diabetes mellitus type 2, controlled ([place])   History of pulmonary embolism  Subjective: No further nausea, vomiting, abdominal pain or diarrhea. Tolerating regular food.   Brief Summary: Amanda Short is a 76 y.o.femalewith medical history significant ofhypertension, hyperlipidemia, diabetes mellitus, GERD, hypothyroidism, gout, depression, seizure, DVT/PE onXarelto, OSA on CPAP, left breast cancer 2006 (lumpectomy),dCHF, who presents with nausea, vomiting, diarrhea, abdominal pain, altered mental status and a seizure. 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 She was been vomiting for the past 4-5 days and has not been able to keep down her pills. She did not have fevers or chills and no localized abdominal pain.     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   Active  Problems: Vomiting/ abdominal pain/ diarrhea and dehydration - CT abd/pelvis unrevealing - recently had diverticulitis but as mentioned, CT on 12/25 was unrevealing - symptoms appears to be resolved now- start liquids and follow - C diff pending but probability is low as there are no signs of colitis on CT     Hypokalemia/ hypocalcemia and Hypomagnesemia - due to above GI issues - replaced appropriately  Acute encephalopathy  - in setting of seizure and multiple electrolyte abnormalities- now at baseline    DVT (deep venous thrombosis)   - on NOAC     Essential hypertension, benign - Toprol, Spironolactone- K mildly elevated due to replacement- will ask her to not take Spironolactone tomorrow and resume the following day  GERD - cont PPI    Hypothyroidism - Synthroid    Diabetes mellitus type 2, controlled   - SSI  Discharge Instructions  Discharge Instructions    Diet - low sodium heart healthy   Complete by:  As directed    Increase activity slowly   Complete by:  As directed      Allergies as of 07/12/2017      Reactions   Allopurinol Nausea And Vomiting      Medication List    STOP taking these medications   spironolactone 25 MG tablet Commonly known as:  ALDACTONE     TAKE these medications   acetaminophen 500 MG tablet Commonly known as:  TYLENOL Take 500 mg by mouth every 6 (six) hours as needed for mild pain.   calcium-vitamin D 500-200 MG-UNIT tablet Commonly known as:  OSCAL 500/200 D-3 Take 1 tablet 2 (two) times daily by mouth.  DULoxetine 60 MG capsule Commonly known as:  CYMBALTA Take 60 mg by mouth daily.   levETIRAcetam 750 MG tablet Commonly known as:  KEPPRA Take 1 tablet (750 mg total) by mouth 2 (two) times daily.   levothyroxine 100 MCG tablet Commonly known as:  SYNTHROID, LEVOTHROID Take 100 mcg by mouth daily before breakfast.   metFORMIN 500 MG tablet Commonly known as:  GLUCOPHAGE Take 500 mg by mouth 2 (two)  times daily with a meal.   metoCLOPramide 10 MG tablet Commonly known as:  REGLAN Take 1 tablet (10 mg total) by mouth every 8 (eight) hours as needed for nausea (nausea/headache).   metoCLOPramide 10 MG tablet Commonly known as:  REGLAN Take 1 tablet (10 mg total) by mouth every 8 (eight) hours as needed for nausea or vomiting.   metoprolol 200 MG 24 hr tablet Commonly known as:  TOPROL-XL Take 100 mg by mouth at bedtime.   montelukast 10 MG tablet Commonly known as:  SINGULAIR Take 10 mg at bedtime by mouth.   multivitamin with minerals Tabs tablet Take 1 tablet daily by mouth.   omeprazole 40 MG capsule Commonly known as:  PRILOSEC Take 40 mg by mouth 2 (two) times daily.   ondansetron 4 MG disintegrating tablet Commonly known as:  ZOFRAN ODT Take 1 tablet (4 mg total) by mouth every 8 (eight) hours as needed for nausea or vomiting.   QUEtiapine 25 MG tablet Commonly known as:  SEROQUEL Take 1 tablet (25 mg total) by mouth at bedtime.   rivaroxaban 20 MG Tabs tablet Commonly known as:  XARELTO Take 1 tablet (20 mg total) by mouth daily with supper.   ULORIC 40 MG tablet Generic drug:  febuxostat Take 40 mg daily by mouth.       Allergies  Allergen Reactions  . Allopurinol Nausea And Vomiting     Procedures/Studies:    Ct Angio Head W Or Wo Contrast  Result Date: 07/10/2017 CLINICAL DATA:  Nausea and vomiting.  Dizziness.  Aphasia. EXAM: CT ANGIOGRAPHY HEAD AND NECK TECHNIQUE: Multidetector CT imaging of the head and neck was performed using the standard protocol during bolus administration of intravenous contrast. Multiplanar CT image reconstructions and MIPs were obtained to evaluate the vascular anatomy. Carotid stenosis measurements (when applicable) are obtained utilizing NASCET criteria, using the distal internal carotid diameter as the denominator. CONTRAST:  94m ISOVUE-370 IOPAMIDOL (ISOVUE-370) INJECTION 76% COMPARISON:  Head CT earlier today.   Brain MRI 06/09/2017. FINDINGS: CTA NECK FINDINGS Aortic arch: Atherosclerosis. No acute finding. Four vessel branching. Right carotid system: Mild atherosclerotic plaque at the ICA bulb. No stenosis or dissection. Left carotid system: Mild atherosclerotic plaque at the ICA bulb. No stenosis or dissection. Vertebral arteries: The left vertebral artery arises from the arch. The vessels are smooth and diffusely patent. Skeleton: Advanced cervical disc degeneration.  Facet spurring. Other neck: No incidental mass or inflammation noted. Upper chest: No acute finding. Review of the MIP images confirms the above findings CTA HEAD FINDINGS Anterior circulation: There is extensive atherosclerotic plaque on the carotid siphons. Cut off at the origin of the right M1 vessel with prominent lenticulostriates and early reconstitution of under filled right MCA branches. On coronal MIPS, there is visible pial collaterals extending from the right ACA over the convexity. Suspect this is a chronic occlusion. In retrospect similar appearance seen on 06/09/2017 T2 weighted imaging. No left-sided major branch occlusion is seen. Aplastic left A1 segment. Posterior circulation: Atherosclerotic plaque on the bilateral vertebral arteries,  extensive on the left where there is advanced narrowing, quantification limited by degree of calcified plaque. The basilar is diffusely patent. Fetal type left PCA with high-grade proximal P2 segment atheromatous narrowing. Venous sinuses: Negative Anatomic variants: As above Delayed phase: Not obtained in the emergent setting Case discussed with Dr. Leonel Ramsay via telephone. Review of the MIP images confirms the above findings IMPRESSION: 1. 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 2. Extensive intracranial atherosclerosis. Advanced proximal left V4 and left PCA narrowing. 3. Atherosclerosis in the neck without  flow limiting stenosis. Electronically Signed   By: Monte Fantasia M.D.   On: 07/10/2017 20:11   Dg Chest 2 View  Result Date: 06/27/2017 CLINICAL DATA:  Cough for a few days.  History of tracheotomy. EXAM: CHEST  2 VIEW COMPARISON:  06/14/2017 FINDINGS: Small bilateral pleural effusion. No superimposed airspace opacity or edema. Chronic cardiomegaly. Calcified granuloma over the right lower lung. A tracheostomy tube remains well seated. Feeding tube and IJ catheter have been removed. 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 IMPRESSION: Small layering pleural effusions.  No noted pneumonia or edema. Electronically Signed   By: Monte Fantasia M.D.   On: 06/27/2017 11:12   Ct Head Wo Contrast  Result Date: 06/19/2017 CLINICAL DATA:  Initial evaluation for under witnessed fall. EXAM: CT HEAD WITHOUT CONTRAST CT CERVICAL SPINE WITHOUT CONTRAST TECHNIQUE: Multidetector CT imaging of the head and cervical spine was performed following the standard protocol without intravenous contrast. Multiplanar CT image reconstructions of the cervical spine were also generated. COMPARISON:  Prior CT from 06/06/2017. FINDINGS: CT HEAD FINDINGS Brain: Atrophy with chronic small vessel ischemic disease. No acute intracranial hemorrhage. No evidence for acute large vessel territory infarct. No mass lesion, midline shift or mass effect. No hydrocephalus. No extra-axial fluid collection. Vascular: No hyperdense vessel. Scattered vascular calcifications noted within the carotid siphons. Skull: Scalp soft tissues and calvarium within normal limits. Sinuses/Orbits: Globes normal soft tissues normal. Paranasal sinuses clear. Small bilateral mastoid effusions. CT CERVICAL SPINE FINDINGS Alignment: Study degraded by motion artifact. Reversal of the normal cervical lordosis, apex at C5. Trace anterolisthesis of C7 on T1. Skull base and vertebrae: Skullbase intact. Normal C1-2 articulations are  preserved in the dens is intact. Vertebral body heights maintained. No acute fracture. Soft tissues and spinal canal: Soft tissues of the neck demonstrate no acute abnormality. No prevertebral edema. Spinal canal within normal limits. Tracheostomy tube partially visualized. 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 Upper chest: Partially visualized upper chest without acute abnormality. Left IJ approach centra venous catheter partially visualized. Partially visualized lung apices grossly clear. IMPRESSION: 1. No acute intracranial process. 2. Moderate cerebral atrophy with chronic small vessel ischemic disease. 3. No acute traumatic injury within cervical spine. 4. 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 Electronically Signed   By: Jeannine Boga M.D.   On: 06/19/2017 04:58   Ct Angio Neck W Or Wo Contrast  Result Date: 07/10/2017 CLINICAL DATA:  Nausea and vomiting.  Dizziness.  Aphasia. EXAM: CT ANGIOGRAPHY HEAD AND NECK TECHNIQUE: Multidetector CT imaging of the head and neck was performed using the standard protocol during bolus administration of intravenous contrast. Multiplanar CT image reconstructions and MIPs were obtained to evaluate the vascular anatomy. Carotid stenosis measurements (when applicable) are obtained utilizing NASCET criteria, using the distal internal carotid diameter as the denominator. CONTRAST:  54m ISOVUE-370 IOPAMIDOL (ISOVUE-370) INJECTION 76%  COMPARISON:  Head CT earlier today.  Brain MRI 06/09/2017. FINDINGS: CTA NECK FINDINGS Aortic arch: Atherosclerosis. No acute finding. Four vessel branching. Right carotid system: Mild atherosclerotic plaque at the ICA bulb. No stenosis or dissection. Left carotid system: Mild atherosclerotic plaque at the ICA bulb. No stenosis or dissection. Vertebral arteries: The left vertebral artery arises from the arch. The vessels are smooth and diffusely patent. Skeleton: Advanced cervical disc  degeneration.  Facet spurring. Other neck: No incidental mass or inflammation noted. Upper chest: No acute finding. Review of the MIP images confirms the above findings CTA HEAD FINDINGS Anterior circulation: There is extensive atherosclerotic plaque on the carotid siphons. Cut off at the origin of the right M1 vessel with prominent lenticulostriates and early reconstitution of under filled right MCA branches. On coronal MIPS, there is visible pial collaterals extending from the right ACA over the convexity. Suspect this is a chronic occlusion. In retrospect similar appearance seen on 06/09/2017 T2 weighted imaging. No left-sided major branch occlusion is seen. Aplastic left A1 segment. Posterior circulation: Atherosclerotic plaque on the bilateral vertebral arteries, extensive on the left where there is advanced narrowing, quantification limited by degree of calcified plaque. The basilar is diffusely patent. Fetal type left PCA with high-grade proximal P2 segment atheromatous narrowing. Venous sinuses: Negative Anatomic variants: As above Delayed phase: Not obtained in the emergent setting Case discussed with Dr. Leonel Ramsay via telephone. Review of the MIP images confirms the above findings IMPRESSION: 1. 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 2. Extensive intracranial atherosclerosis. Advanced proximal left V4 and left PCA narrowing. 3. Atherosclerosis in the neck without flow limiting stenosis. Electronically Signed   By: Monte Fantasia M.D.   On: 07/10/2017 20:11   Ct Cervical Spine Wo Contrast  Result Date: 06/19/2017 CLINICAL DATA:  Initial evaluation for under witnessed fall. EXAM: CT HEAD WITHOUT CONTRAST CT CERVICAL SPINE WITHOUT CONTRAST TECHNIQUE: Multidetector CT imaging of the head and cervical spine was performed following the standard protocol without intravenous contrast. Multiplanar CT image  reconstructions of the cervical spine were also generated. COMPARISON:  Prior CT from 06/06/2017. FINDINGS: CT HEAD FINDINGS Brain: Atrophy with chronic small vessel ischemic disease. No acute intracranial hemorrhage. No evidence for acute large vessel territory infarct. No mass lesion, midline shift or mass effect. No hydrocephalus. No extra-axial fluid collection. Vascular: No hyperdense vessel. Scattered vascular calcifications noted within the carotid siphons. Skull: Scalp soft tissues and calvarium within normal limits. Sinuses/Orbits: Globes normal soft tissues normal. Paranasal sinuses clear. Small bilateral mastoid effusions. CT CERVICAL SPINE FINDINGS Alignment: Study degraded by motion artifact. Reversal of the normal cervical lordosis, apex at C5. Trace anterolisthesis of C7 on T1. Skull base and vertebrae: Skullbase intact. Normal C1-2 articulations are preserved in the dens is intact. Vertebral body heights maintained. No acute fracture. Soft tissues and spinal canal: Soft tissues of the neck demonstrate no acute abnormality. No prevertebral edema. Spinal canal within normal limits. Tracheostomy tube partially visualized. 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 Upper chest: Partially visualized upper chest without acute abnormality. Left IJ approach centra venous catheter partially visualized. Partially visualized lung apices grossly clear. IMPRESSION: 1. No acute intracranial process. 2. Moderate cerebral atrophy with chronic small vessel ischemic disease. 3. No acute traumatic injury within cervical spine. 4. 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 Electronically Signed   By: Jeannine Boga M.D.   On: 06/19/2017 04:58  Ct Abdomen Pelvis W Contrast  Result Date: 07/10/2017 CLINICAL DATA:  Nausea, vomiting. EXAM: CT ABDOMEN AND PELVIS WITH CONTRAST TECHNIQUE: Multidetector CT imaging of the abdomen and pelvis was performed using the standard  protocol following bolus administration of intravenous contrast. CONTRAST:  119m ISOVUE-300 IOPAMIDOL (ISOVUE-300) INJECTION 61% COMPARISON:  05/27/2017 FINDINGS: Lower chest: Calcified granuloma in the right lower lobe. Heart is mildly enlarged. Hepatobiliary: Prior cholecystectomy.  No focal hepatic abnormality. Pancreas: No focal abnormality or ductal dilatation. Spleen: No focal abnormality.  Normal size. Adrenals/Urinary Tract: No adrenal abnormality. No focal renal abnormality. No stones or hydronephrosis. Urinary bladder is unremarkable. Stomach/Bowel: Descending colonic and sigmoid diverticulosis. No active diverticulitis. Appendix is normal. Stomach and small bowel decompressed, unremarkable. Vascular/Lymphatic: Aorta and iliac vessels are heavily calcified. No evidence of aneurysm or adenopathy. Reproductive: Prior hysterectomy.  No adnexal masses. Other: No free fluid or free air. Musculoskeletal: Prior left hip replacement. Degenerative changes in the lumbar spine. No acute findings. IMPRESSION: Colonic diverticulosis.  No active diverticulitis. Normal appendix. Mild cardiomegaly. Aortoiliac atherosclerosis. No acute findings in the abdomen or pelvis. Electronically Signed   By: KRolm BaptiseM.D.   On: 07/10/2017 12:34   Dg Chest Port 1 View  Result Date: 06/14/2017 CLINICAL DATA:  Pneumonia EXAM: PORTABLE CHEST 1 VIEW COMPARISON:  06/13/2017 FINDINGS: Tracheostomy remains in good position unchanged. Central venous catheter tip in the SVC is unchanged. No pneumothorax. Feeding tube has been placed in the interval and enters the stomach with the tip not visualized. Bibasilar airspace disease left greater than right is unchanged. Small left effusion. Negative for edema. IMPRESSION: No significant change from yesterday. Support lines remain in good position. Bibasilar airspace disease left greater than right with small left effusion unchanged. Electronically Signed   By: CFranchot GalloM.D.   On:  06/14/2017 07:34   Dg Chest Port 1 View  Result Date: 06/13/2017 CLINICAL DATA:  Pneumonia EXAM: PORTABLE CHEST 1 VIEW COMPARISON:  06/12/2017 FINDINGS: Tracheostomy and left central line remain in place, unchanged. Cardiomegaly. Mild vascular congestion. Bibasilar opacities have improved since prior study. No visible effusions or acute bony abnormality. IMPRESSION: Improving bibasilar atelectasis or infiltrates. Cardiomegaly, vascular congestion. Electronically Signed   By: KRolm BaptiseM.D.   On: 06/13/2017 09:38   Dg Abdomen Acute W/chest  Result Date: 07/09/2017 CLINICAL DATA:  Nausea, vomiting and diarrhea. EXAM: DG ABDOMEN ACUTE W/ 1V CHEST COMPARISON:  Chest radiograph 06/27/2017 FINDINGS: Cardiomediastinal contours are unchanged. Nodular opacity in the right lower lung is unchanged. No focal airspace consolidation or pulmonary edema. No pleural effusion or pneumothorax. 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 There is no free intraperitoneal air. Bowel gas pattern is normal. There is right convex lumbar scoliosis. Left total hip arthroplasty visualized portion is normal. IMPRESSION: No acute abnormality of the abdomen or pelvis. Electronically Signed   By: KUlyses JarredM.D.   On: 07/09/2017 01:00   Dg Swallowing Func-speech Pathology  Result Date: 06/17/2017 Objective Swallowing Evaluation: Type of Study: MBS-Modified Barium Swallow Study  Patient Details Name: AHester JoslinMRN: [id number] of Birth: 8August 19, 1942Today's Date: 06/17/2017 Time: SLP Start Time (ACUTE ONLY): 1155 -SLP Stop Time (ACUTE ONLY): 1220 SLP Time Calculation (min) (ACUTE ONLY): 25 min Past Medical History: Past Medical History: Diagnosis Date . Arthritis   "back, hands" (07/22/2015) . Cancer of left breast ([place] 2006  S/P lumpectomy . Complication of anesthesia   "brief breathing problem at surgery center in ~ 2005 when I had gallbladder OR" . Depression  .  DVT (deep venous thrombosis) ([place]) 03/2013   LLE . GERD (gastroesophageal reflux disease)  . Hyperlipidemia  . Hypertension  . Hypothyroidism  . OSA treated with BiPAP  . Pulmonary embolism ([facility]) 03/2013 . Thyroid disease   Hypothyroid . Type II diabetes mellitus ([place])  Past Surgical History: Past Surgical History: Procedure Laterality Date . ABDOMINAL HYSTERECTOMY  1970s . BREAST BIOPSY Left 2006 . BREAST LUMPECTOMY Left 2006 . BUNIONECTOMY WITH HAMMERTOE RECONSTRUCTION Left  . ESOPHAGOGASTRODUODENOSCOPY N/A 05/29/2017  Procedure: ESOPHAGOGASTRODUODENOSCOPY (EGD);  Surgeon: Irene Shipper, MD;  Location: [facility];  Service: Endoscopy;  Laterality: N/A; . JOINT REPLACEMENT   . LAPAROSCOPIC CHOLECYSTECTOMY  ~ 2005 . TOTAL HIP ARTHROPLASTY Left 2010 . TOTAL KNEE ARTHROPLASTY Bilateral 2005-2006 HPI: 76 y.o.female with history of depression, DVT with pulmonary embolism, OSA, and diabetes mellitus type 2 presented on 11/21 with altered mentation, status elipticus.  MRI without obvious focal source. ETT 11/21; extubated 11/27, but with immediate stridor/vocal fold edema; unable to reintubate and required emergent trach.  Subjective: The patient was seen in radiology.  Assessment / Plan / Recommendation CHL IP CLINICAL IMPRESSIONS 06/17/2017 Clinical Impression MBS was completed using thin liquids, nectar thick liquids, honey thick liquids, pureed material and solids.  The patient presented with a mild pharyngeal dysphagia characterized by delayed initiation of the swallow which lead to flash penetration just prior to the swallow given thin and nectar thick liquids.  This material was observed to completely clear the laryngeal vestibule.  Chin tuck was attempted and ineffective to prevent.  Prominent cricopharyngeus was noted that was not causing any functional issues.  Esophageal sweep did not reveal overt issues.  Recommend a regular diet with thin liquids.  The patient needs to be totally upright for all intake and should take one, small sip at a time.  NO  straws and medications should be whole in pureed material.  ST will follow up for therapeutic diet tolerance and swallowing therapy.   SLP Visit Diagnosis Dysphagia, pharyngeal phase (R13.13) Attention and concentration deficit following -- Frontal lobe and executive function deficit following -- Impact on safety and function Mild aspiration risk   CHL IP TREATMENT RECOMMENDATION 06/17/2017 Treatment Recommendations Therapy as outlined in treatment plan below   Prognosis 06/17/2017 Prognosis for Safe Diet Advancement Good Barriers to Reach Goals -- Barriers/Prognosis Comment -- CHL IP DIET RECOMMENDATION 06/17/2017 SLP Diet Recommendations Regular solids;Thin liquid Liquid Administration via Cup;No straw Medication Administration Whole meds with puree Compensations Slow rate;Small sips/bites Postural Changes Seated upright at 90 degrees   CHL IP OTHER RECOMMENDATIONS 06/17/2017 Recommended Consults -- Oral Care Recommendations Oral care BID Other Recommendations --   CHL IP FOLLOW UP RECOMMENDATIONS 06/17/2017 Follow up Recommendations Inpatient Rehab   CHL IP FREQUENCY AND DURATION 06/17/2017 Speech Therapy Frequency (ACUTE ONLY) min 3x week Treatment Duration 2 weeks      CHL IP ORAL PHASE 06/17/2017 Oral Phase WFL Oral - Pudding Teaspoon -- Oral - Pudding Cup -- Oral - Honey Teaspoon -- Oral - Honey Cup -- Oral - Nectar Teaspoon -- Oral - Nectar Cup -- Oral - Nectar Straw -- Oral - Thin Teaspoon -- Oral - Thin Cup -- Oral - Thin Straw -- Oral - Puree -- Oral - Mech Soft -- Oral - Regular -- Oral - Multi-Consistency -- Oral - Pill -- Oral Phase - Comment --  CHL IP PHARYNGEAL PHASE 06/17/2017 Pharyngeal Phase Impaired Pharyngeal- Pudding Teaspoon -- Pharyngeal -- Pharyngeal- Pudding Cup -- Pharyngeal -- Pharyngeal- Honey Teaspoon -- Pharyngeal --  Pharyngeal- Honey Cup Delayed swallow initiation-vallecula Pharyngeal -- Pharyngeal- Nectar Teaspoon -- Pharyngeal -- Pharyngeal- Nectar Cup Delayed swallow initiation-pyriform  sinuses Pharyngeal -- Pharyngeal- Nectar Straw -- Pharyngeal -- Pharyngeal- Thin Teaspoon Delayed swallow initiation-pyriform sinuses;Penetration/Aspiration before swallow Pharyngeal Material enters airway, remains ABOVE vocal cords then ejected out Pharyngeal- Thin Cup Delayed swallow initiation-pyriform sinuses;Penetration/Aspiration before swallow Pharyngeal Material enters airway, remains ABOVE vocal cords then ejected out Pharyngeal- Thin Straw Delayed swallow initiation-pyriform sinuses;Penetration/Aspiration before swallow Pharyngeal Material enters airway, remains ABOVE vocal cords then ejected out Pharyngeal- Puree -- Pharyngeal -- Pharyngeal- Mechanical Soft -- Pharyngeal -- Pharyngeal- Regular -- Pharyngeal -- Pharyngeal- Multi-consistency -- Pharyngeal -- Pharyngeal- Pill -- Pharyngeal -- Pharyngeal Comment --  Shelly Flatten, MA, CCC-SLP Acute Rehab SLP [phone] Lamar Sprinkles 06/17/2017, 12:35 PM              Ct Head Code Stroke Wo Contrast  Result Date: 07/10/2017 CLINICAL DATA:  Code stroke.  Nausea, vomiting, and dizziness. EXAM: CT HEAD WITHOUT CONTRAST TECHNIQUE: Contiguous axial images were obtained from the base of the skull through the vertex without intravenous contrast. COMPARISON:  06/19/2017 FINDINGS: Brain: No evidence of acute infarction, hemorrhage, hydrocephalus, extra-axial collection or mass lesion/mass effect. Chronic small vessel ischemia in the cerebral white matter. 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 Atrophy with variable sulcal widening. Vascular: Atherosclerotic calcification. Skull: Negative Sinuses/Orbits: No acute finding Other: These results were communicated to Dr. Leonel Ramsay at [place] 12/25/2018by text page via the [facility] messaging system. ASPECTS [facility] Stroke Program Early CT Score) Not scored with this history. IMPRESSION: 1. Motion degraded study without acute finding. 2. Chronic small vessel disease. Electronically Signed   By:  Monte Fantasia M.D.   On: 07/10/2017 19:46       Discharge Exam: Vitals:   07/12/17 0839 07/12/17 1607  BP: (!) 172/79   Pulse: 76   Resp:    Temp:  98.1 F (36.7 C)  SpO2:     Vitals:   07/12/17 0517 07/12/17 0800 07/12/17 0839 07/12/17 1607  BP:   (!) 172/79   Pulse: 69  76   Resp: (!) 24     Temp:  97.9 F (36.6 C)  98.1 F (36.7 C)  TempSrc:  Oral  Oral  SpO2: 99%     Weight: 68.2 kg (150 lb 5.7 oz)     Height:        General: Pt is alert, awake, not in acute distress Cardiovascular: RRR, S1/S2 +, no rubs, no gallops Respiratory: CTA bilaterally, no wheezing, no rhonchi Abdominal: Soft, NT, ND, bowel sounds + Extremities: no edema, no cyanosis    The results of significant diagnostics from this hospitalization (including imaging, microbiology, ancillary and laboratory) are listed below for reference.     Microbiology: Recent Results (from the past 240 hour(s))  MRSA PCR Screening     Status: None   Collection Time: 07/11/17  1:49 AM  Result Value Ref Range Status   MRSA by PCR NEGATIVE NEGATIVE Final    Comment:        The GeneXpert MRSA Assay (FDA approved for NASAL specimens only), is one component of a comprehensive MRSA colonization surveillance program. It is not intended to diagnose MRSA infection nor to guide or monitor treatment for MRSA infections.   Culture, blood (Routine X 2) w Reflex to ID Panel     Status: None (Preliminary result)   Collection Time: 07/11/17  3:20 AM  Result Value Ref Range Status  Specimen Description BLOOD RIGHT ARM  Final   Special Requests IN PEDIATRIC BOTTLE Blood Culture adequate volume  Final   Culture NO GROWTH 1 DAY  Final   Report Status PENDING  Incomplete  Culture, blood (Routine X 2) w Reflex to ID Panel     Status: None (Preliminary result)   Collection Time: 07/11/17  3:40 AM  Result Value Ref Range Status   Specimen Description BLOOD RIGHT ARM  Final   Special Requests IN PEDIATRIC BOTTLE Blood  Culture adequate volume  Final   Culture NO GROWTH 1 DAY  Final   Report Status PENDING  Incomplete     Labs: BNP (last 3 results) Recent Labs    07/11/17 0347  BNP 9,753.0*   Basic Metabolic Panel: Recent Labs  Lab 07/10/17 1010 07/10/17 2018 07/10/17 2031 07/11/17 1156 07/12/17 0423 07/12/17 1509 07/12/17 1711  NA 130*  --  131* 140 140 140 136  K 2.9*  --  3.4* 3.1* 2.8* 6.6* 5.9*  CL 88*  --  85* 98* 100* 106 100*  CO2 27  --   --  _0 GLUCOSE 189*  --  159* 142* 176* 142* 128*  BUN 8  --  8 5* <5* 7 7  CREATININE 0.72  --  0.70 0.79 0.63 0.70 0.71  CALCIUM 7.0*  --   --  7.0* 6.8* 8.3* 7.9*  MG  --  0.2*  --  1.8  1.9 1.7  --   --    Liver Function Tests: Recent Labs  Lab 07/09/17 0053 07/10/17 1010  AST 15 18  ALT 10* 10*  ALKPHOS 92 72  BILITOT 0.8 1.1  PROT 6.3* 6.1*  ALBUMIN 3.6 3.6   Recent Labs  Lab 07/11/17 0347  LIPASE 17   Recent Labs  Lab 07/10/17 2008  AMMONIA 18   CBC: Recent Labs  Lab 07/09/17 0053 07/10/17 1010 07/10/17 2031 07/11/17 1156 07/12/17 0423  WBC 9.2 16.3*  --  9.5 11.3*  NEUTROABS 7.2 14.1*  --   --   --   HGB 11.9* 11.8* 12.2 11.5* 11.0*  HCT 36.5 35.5* 36.0 35.9* 34.7*  MCV 95.8 92.7  --  95.7 95.9  PLT 227 269  --  260 232   Cardiac Enzymes: Recent Labs  Lab 07/09/17 0053 07/11/17 0347 07/11/17 1156  TROPONINI <0.03 0.04* 0.03*   BNP: Invalid input(s): POCBNP CBG: Recent Labs  Lab 07/11/17 1653 07/11/17 2104 07/12/17 0735 07/12/17 1143 07/12/17 1606  GLUCAP 115* 157* 202* 134* 112*   D-Dimer No results for input(s): DDIMER in the last 72 hours. Hgb A1c Recent Labs    07/11/17 0347  HGBA1C 7.0*   Lipid Profile Recent Labs    07/11/17 0347  CHOL 131  HDL 46  LDLCALC 56  TRIG 144  CHOLHDL 2.8   Thyroid function studies No results for input(s): TSH, T4TOTAL, T3FREE, THYROIDAB in the last 72 hours.  Invalid input(s): FREET3 Anemia work up No results for input(s):  VITAMINB12, FOLATE, FERRITIN, TIBC, IRON, RETICCTPCT in the last 72 hours. Urinalysis    Component Value Date/Time   COLORURINE YELLOW 07/10/2017 2149   APPEARANCEUR CLEAR 07/10/2017 2149   LABSPEC 1.042 (H) 07/10/2017 2149   PHURINE 7.0 07/10/2017 2149   GLUCOSEU NEGATIVE 07/10/2017 2149   HGBUR NEGATIVE 07/10/2017 2149   BILIRUBINUR NEGATIVE 07/10/2017 2149   KETONESUR 5 (A) 07/10/2017 2149   PROTEINUR 100 (A) 07/10/2017 2149   UROBILINOGEN 0.2 03/31/2013 2006  NITRITE NEGATIVE 07/10/2017 2149   LEUKOCYTESUR NEGATIVE 07/10/2017 2149   Sepsis Labs Invalid input(s): PROCALCITONIN,  WBC,  LACTICIDVEN Microbiology Recent Results (from the past 240 hour(s))  MRSA PCR Screening     Status: None   Collection Time: 07/11/17  1:49 AM  Result Value Ref Range Status   MRSA by PCR NEGATIVE NEGATIVE Final    Comment:        The GeneXpert MRSA Assay (FDA approved for NASAL specimens only), is one component of a comprehensive MRSA colonization surveillance program. It is not intended to diagnose MRSA infection nor to guide or monitor treatment for MRSA infections.   Culture, blood (Routine X 2) w Reflex to ID Panel     Status: None (Preliminary result)   Collection Time: 07/11/17  3:20 AM  Result Value Ref Range Status   Specimen Description BLOOD RIGHT ARM  Final   Special Requests IN PEDIATRIC BOTTLE Blood Culture adequate volume  Final   Culture NO GROWTH 1 DAY  Final   Report Status PENDING  Incomplete  Culture, blood (Routine X 2) w Reflex to ID Panel     Status: None (Preliminary result)   Collection Time: 07/11/17  3:40 AM  Result Value Ref Range Status   Specimen Description BLOOD RIGHT ARM  Final   Special Requests IN PEDIATRIC BOTTLE Blood Culture adequate volume  Final   Culture NO GROWTH 1 DAY  Final   Report Status PENDING  Incomplete     Time coordinating discharge: Over 30 minutes  SIGNED:   Debbe Odea, MD  [facility] 07/12/2017, 6:34 PM Pager    If 7PM-7AM, please contact night-coverage [email or web address] Password [email or web address]

## 2017-07-12 NOTE — Progress Notes (Signed)
Called by nursing regarding patients desire to leave AMA.   On entry into the room, patient had disconnected monitors and IVs. She was sitting in the chair. She states that she is upset with being repeatedly woken up thorughout the night.   She is oriented x3, but regarding her reason for hospitalization, she does not remember leaving after coming in for nausea and vomiting. She is not aware that she had seizures.   I indicate that not remembering seizures is normal, but given that her symptoms were not convulsive, the only way to know was to wake her up and talk to her. She is clearly frustrated. She does appear, however to me to be competent at this time.    Her calcium is quite low at 6.8 and albumin was 3.6. I wonder if hypocalcemia could be playing a role in her emotional state. This also is a risk for lowering seizure threshold and therefore I have ordered IV calcium gluconate. '  After her husband arrives, she calms down and agrees to get back in bed. I think that spacing out neurochecks is reasonable at this point that she has not had seizures since shortly after admission.   Roland Rack, MD [facility] [phone]  If 7pm- 7am, please page neurology on call as listed in [place].

## 2017-07-12 NOTE — Progress Notes (Signed)
At 0600 this RN noted pt had an increase in pupil size from 0400. Another RN was asked to come in and assess her pupils. During this assessment  Patient became very irritated with nursing staff. After leaving the room, patient requested her phone to call her husband to come and pick her up.  This RN then went into the room, where patient proceeded to remove all monitoring equipment and demanded to be let out of the bed. Charge nurse attempted to discourage patient from leaving. Patient was education on the importance on checking neuro status.  Neurology was paged regarding patient requesting to leave AMA, shortly after came up to see patient face to face.While waiting for neurologist patient disconnected self from IV heparin, and oxygen. Patient's oxygen saturation began to drop into the 70's , patient was educated on importance of continuing to wear nasal cannula to support respiratory status. Patient refused.  After neurologist spoke with patient, patient still requested for the neurologist to speak to her POA- Husband who arrived while neurologist was still in the room.  Will restart: cardiac monitoring, heparin drip, calcium infusion and postpone MRI per MD order.

## 2017-07-12 NOTE — Progress Notes (Signed)
ANTICOAGULATION CONSULT NOTE - Initial Consult  Pharmacy Consult for Xarelto Indication: h/o PE  Allergies  Allergen Reactions  . Allopurinol Nausea And Vomiting    Patient Measurements: Height: _0  (154.9 cm) Weight: 150 lb 5.7 oz (68.2 kg) IBW/kg (Calculated) : 47.8 Heparin Dosing Weight: 65 kg  Assessment: 76 y.o. female admitted with seizures, on Xarelto PTA for hx of PE. Heparin initiated on admission.  Transitioning back to Xarelto today.  Patient disconnected herself from her IV early this morning.  Goal of Therapy:  Therapeutic anticoagulation   Plan:  Xarelto 6m po daily starting this morning, then at dinnertime thereafter.  FCandie Mile12/27/2018,8:24 AM

## 2017-07-12 NOTE — Progress Notes (Signed)
ANTICOAGULATION CONSULT NOTE - F/u Consult  Pharmacy Consult for Heparin Indication: h/o PE  Allergies  Allergen Reactions  . Allopurinol Nausea And Vomiting   Patient Measurements: Height: _0  (154.9 cm) Weight: 150 lb 5.7 oz (68.2 kg) IBW/kg (Calculated) : 47.8 Heparin Dosing Weight: 65 kg  Assessment: 76 y.o. female admitted with seizures, on Xarelto PTA for hx of PE with last dose unknown. Pharmacy to dose heparin while Xarelto on hold due to N/V/seizures.   Patient is difficult lab draw, requiring 3-4 attempts for labs, with successful draw this morning from foot. AM aPTT is therapeutic at 101s and heparin level remains falsely elevated due to recent Xarelto. Will continue to follow aPTTs.   CBC stable and no s/s bleeding per RN.   Will empirically reduce heparin gtt slightly to keep within goal range.   Goal of Therapy:  APTT 66-102 Heparin level 0.3-0.7 units/ml Monitor platelets by anticoagulation protocol: Yes   Plan:  Decrease heparin gtt to 950 units/hr APTT in 8 hrs Daily heparin level, aPTT, and CBC Monitor for s/s bleeding  Lavonda Jumbo, PharmD Clinical Pharmacist 07/12/17 5:37 AM

## 2017-07-14 LAB — URINE CULTURE

## 2017-07-16 LAB — CULTURE, BLOOD (ROUTINE X 2)
Culture: NO GROWTH
Culture: NO GROWTH
Special Requests: ADEQUATE
Special Requests: ADEQUATE

## 2017-07-31 ENCOUNTER — Institutional Professional Consult (permissible substitution): Payer: Medicare Other | Admitting: Internal Medicine

## 2017-09-05 ENCOUNTER — Other Ambulatory Visit: Payer: Self-pay

## 2017-09-05 ENCOUNTER — Encounter: Payer: Self-pay | Admitting: Physical Therapy

## 2017-09-05 ENCOUNTER — Ambulatory Visit: Payer: Medicare Other | Attending: Family Medicine | Admitting: Physical Therapy

## 2017-09-05 DIAGNOSIS — Z9181 History of falling: Secondary | ICD-10-CM | POA: Insufficient documentation

## 2017-09-05 DIAGNOSIS — M6281 Muscle weakness (generalized): Secondary | ICD-10-CM | POA: Diagnosis present

## 2017-09-05 DIAGNOSIS — R262 Difficulty in walking, not elsewhere classified: Secondary | ICD-10-CM

## 2017-09-05 NOTE — Therapy (Signed)
During this treatment session, the therapist was present, participating in and directing the treatment. [address] [place], [name], [address] Phone: [phone]   Fax:  [phone]  Physical Therapy Evaluation  Patient Details  Name: Amanda Short MRN: [id number] Date of Birth: June 09, 1941 Referring Provider: Dr. Sandria Manly   Encounter Date: 09/05/2017  PT End of Session - 09/05/17 1617    Visit Number  1    Date for PT Re-Evaluation  11/03/17    PT Start Time  1530    PT Stop Time  1615    PT Time Calculation (min)  45 min    Activity Tolerance  Patient tolerated treatment well    Behavior During Therapy  [facility] for tasks assessed/performed       Past Medical History:  Diagnosis Date  . Arthritis    "back, hands" (07/22/2015)  . Cancer of left breast ([place]) 2006   S/P lumpectomy  . Chronic diastolic CHF (congestive heart failure) ([place]) 07/10/2017  . Complication of anesthesia    "brief breathing problem at surgery center in ~ 2005 when I had gallbladder OR"  . Concussion 03/2013   due to fall  . Depression   . DVT (deep venous thrombosis) ([place]) 03/2013   LLE  . GERD (gastroesophageal reflux disease)   . Hyperlipidemia   . Hypertension   . Hypothyroidism   . OSA treated with BiPAP   . Pulmonary embolism ([place]) 03/2013  . Seizures ([place])   . Thyroid disease    Hypothyroid  . Type II diabetes mellitus ([place])     Past Surgical History:  Procedure Laterality Date  . ABDOMINAL HYSTERECTOMY  1970s  . BREAST BIOPSY Left 2006  . BREAST LUMPECTOMY Left 2006  . BUNIONECTOMY WITH HAMMERTOE RECONSTRUCTION Left   . ESOPHAGOGASTRODUODENOSCOPY N/A 05/29/2017   Procedure: ESOPHAGOGASTRODUODENOSCOPY (EGD);  Surgeon: Irene Shipper, MD;  Location: [facility];  Service: Endoscopy;  Laterality: N/A;  . JOINT REPLACEMENT    . LAPAROSCOPIC CHOLECYSTECTOMY  ~ 2005  . TOTAL HIP ARTHROPLASTY Left 2010  . TOTAL  KNEE ARTHROPLASTY Bilateral 2005-2006    There were no vitals filed for this visit.   Subjective Assessment - 09/05/17 1534    Subjective  Pt. reports being admitted to the hospital 3x within the past 3 months for diverticulitis and the two most recent visits were due to seizures. Dr.s did lots of testing and pt. reports that they aren't sure of the cause of those seizures because everything appeared normal. Pt. went home in between each hospital visit. While in the hospital pt. received PT and once going home pt. has receive home health PT and OT working on strengthening, gait, and balance for about 5-6 weeks that ended 09/03/17.  Pt. reports no being dizzy at all when walking but unsteady and like she leans to the left and would lose balance if didn't have walker or something around her. Pt. reports no using a walker prior to hospitalization for seizure.     Patient is accompained by:  Family member    Patient Stated Goals  being able to be independent and walk again without a walker    Currently in Pain?  Yes    Pain Score  0-No pain    Effect of Pain on Daily Activities  ADLs - grooming, showering, donning/doffing clothing         [facility] PT Assessment - 09/05/17 0001      Assessment  Medical Diagnosis  gait and balance training, generalized weakness    Referring Provider  Dr. Sandria Manly    Prior Therapy  in the hospital as well as at home PT for about 6 weeks post last hospital stay that ended on 09/03/17      Precautions   Precaution Comments  heart cath for dx purposes      Restrictions   Weight Bearing Restrictions  No      Balance Screen   Has the patient fallen in the past 6 months  Yes    How many times?  1    Has the patient had a decrease in activity level because of a fear of falling?   No    Is the patient reluctant to leave their home because of a fear of falling?   No      Home Environment   Additional Comments  a step going in and out of the bathtub that she has  difficulty with, has some cats to care for, normal housework cooking and cleaning      Prior Function   Level of Independence  Independent    Vocation  Retired    Leisure  no normal exercise routine, going shopping       ROM / Strength   AROM / PROM / Strength  AROM;Strength      AROM   Overall AROM Comments  AROM for hip, knee, ankle all [facility]      Strength   Strength Assessment Site  Hip;Knee;Ankle    Right/Left Hip  Right;Left    Right Hip Flexion  3+/5    Right Hip Extension  4-/5    Right Hip External Rotation   3/5    Right Hip Internal Rotation  3/5    Right Hip ABduction  3+/5    Right Hip ADduction  3+/5    Left Hip Flexion  3+/5    Left Hip Extension  3+/5    Left Hip External Rotation  3/5    Left Hip Internal Rotation  3/5    Left Hip ABduction  3+/5    Left Hip ADduction  3+/5    Right/Left Knee  Right;Left    Right Knee Flexion  3+/5    Right Knee Extension  4/5    Left Knee Flexion  3+/5    Left Knee Extension  4/5    Right/Left Ankle  Right;Left    Right Ankle Dorsiflexion  3+/5    Right Ankle Plantar Flexion  3+/5    Left Ankle Dorsiflexion  3+/5    Left Ankle Plantar Flexion  3+/5      Ambulation/Gait   Gait Comments  gait with walker, very slow, decrease stride length      Balance   Balance Assessed  Yes      Standardized Balance Assessment   Standardized Balance Assessment  Berg Balance Test;Timed Up and Go Test      Berg Balance Test   Sit to Stand  Able to stand  independently using hands    Standing Unsupported  Able to stand safely 2 minutes    Sitting with Back Unsupported but Feet Supported on Floor or Stool  Able to sit safely and securely 2 minutes    Stand to Sit  Controls descent by using hands    Transfers  Able to transfer safely, definite need of hands    Standing Unsupported with Eyes Closed  Able to stand 10 seconds with supervision pt. reports  feeling wobbly    Standing Ubsupported with Feet Together  Able to place feet together  independently but unable to hold for 30 seconds    From Standing, Reach Forward with Outstretched Arm  Can reach forward >12 cm safely (5")    From Standing Position, Pick up Object from Floor  Able to pick up shoe, needs supervision    From Standing Position, Turn to Look Behind Over each Shoulder  Looks behind one side only/other side shows less weight shift    Turn 360 Degrees  Able to turn 360 degrees safely but slowly pt. reported feeling unsafe    Standing Unsupported, Alternately Place Feet on Step/Stool  Able to complete >2 steps/needs minimal assist able to complete steps but very unsteady on LLE need sup.    Standing Unsupported, One Foot in [employer] balance while stepping or standing    Standing on One Leg  Unable to try or needs assist to prevent fall    Total Score  34      Timed Up and Go Test   Normal TUG (seconds)  35             Objective measurements completed on examination: See above findings.              PT Education - 09/05/17 1616    Education provided  Yes    Education Details  HEP exercises    Person(s) Educated  Patient;Spouse    Methods  Explanation;Demonstration;Verbal cues;Handout    Comprehension  Returned demonstration;Verbalized understanding       PT Short Term Goals - 09/05/17 1624      PT SHORT TERM GOAL #1   Title  independent with intial HEP    Time  2    Period  Weeks    Status  New        PT Long Term Goals - 09/05/17 1625      PT LONG TERM GOAL #1   Title  increase overall hip LE strength for functional gait and safety    Time  8    Period  Weeks    Status  New      PT LONG TERM GOAL #2   Title  decrease TUG time to less than 15 seconds for functional gait and safety    Baseline  35    Time  8    Period  Weeks    Status  New      PT LONG TERM GOAL #3   Title  increase BERG to 50/56 for safety and independence    Time  8    Period  Weeks    Status  New      PT LONG TERM GOAL #4   Title  gait  without an assistive device for independence and QOL    Time  8    Period  Weeks    Status  New             Plan - 09/05/17 1617    Clinical Impression Statement  Pt. had been hospitalized 3x in the past 6 months for diverticulitis and the two most recent admissions were for seizures. Pt. had PT in the hospital and home health PT to work on gait, balance and strengthening. Pt. gait with walker has decreased cadence and stride length.  Pt. TUG = 35 seconds. Pt. BERG = 34/56. Pt. had lots of difficulty and seemed to lose balance when attempting tandem stance as  well as SLS needing lots of guarding for safety. Pt. seems timid and hesitant when doing things. She has had 1 fall in the past 6 months but that did not result in injury or fear. Pt. was independent prior to hospitalizations but now has lots of weakness. Most hip strength = 3-/5. Pt, has week knee flexion and weak DF/PF as well. Pt. doesn't seem fearful to leave house but is cognizant of weakness and depends on walker and husband. Pt. does not have dizziness but reports feelings of being unstable or 'wobbly.'  Pt. wants to return to being independent and back to normal function.     Clinical Presentation  Evolving    Clinical Presentation due to:  s/p hospitalizations     Clinical Decision Making  Low    Rehab Potential  Good    PT Frequency  2x / week    PT Duration  8 weeks    PT Treatment/Interventions  Electrical Stimulation;Balance training;Therapeutic exercise;Therapeutic activities;Stair training;Gait training;Patient/family education    PT Next Visit Plan  begin overall strengthening and balance ex    PT Home Exercise Plan  standing marching, ABD, ext. and long arc quad    Consulted and Agree with Plan of Care  Patient       Patient will benefit from skilled therapeutic intervention in order to improve the following deficits and impairments:  Abnormal gait, Decreased activity tolerance, Decreased endurance, Decreased  strength, Decreased balance, Difficulty walking  Visit Diagnosis: Difficulty in walking, not elsewhere classified  Muscle weakness (generalized)  Risk for falls     Problem List Patient Active Problem List   Diagnosis Date Noted  . Hypomagnesemia 07/11/2017  . Nausea vomiting and diarrhea 07/10/2017  . Hypokalemia 07/10/2017  . Chronic diastolic CHF (congestive heart failure) ([place]) 07/10/2017  . Seizure ([place]) 07/10/2017  . OSA (obstructive sleep apnea) 07/10/2017  . Sepsis ([place]) 07/10/2017  . Seizure disorder ([place]) 07/05/2017  . Pulmonary HTN ([place]) 07/05/2017  . Anxiety state   . Physical debility 06/23/2017  . 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 History of acute respiratory failure   . Tracheostomy, acute management ([place])   . Aspiration pneumonia ([place])   . Encephalopathy   . Status epilepticus ([place])   . Benign essential HTN   . 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 History of pulmonary embolism   . Acute blood loss anemia   . Diverticulitis of colon   . Tachypnea   . Encounter for orogastric (OG) tube placement   . Encounter for central line placement   . Encounter for attention to tracheostomy ([place])   . Acute respiratory failure with hypoxia ([place])   . Acute encephalopathy 06/06/2017  . Diverticulitis of large intestine without perforation or abscess without bleeding   . Abdominal pain   . Non-intractable cyclical vomiting with nausea   . Abnormal CT of the abdomen   . Hypocalcemia 05/27/2017  . 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 Colitis 05/27/2017  . Diabetic foot infection ([place]) 07/21/2015  . Diabetes mellitus type 2, controlled ([place]) 07/21/2015  . Cellulitis of foot, right 07/21/2015  . Cellulitis of right foot 07/21/2015  . Acute pulmonary embolism ([place]) 04/01/2013  . DVT (deep venous thrombosis) ([place]) 04/01/2013  . HX: breast cancer 04/01/2013  . Concussion 04/01/2013  . Diabetes ([place]) 04/01/2013  . Essential hypertension, benign  04/01/2013  . Hypothyroidism 04/01/2013  . OSA on CPAP 04/01/2013    Juliann Pulse SPT 09/05/2017, 4:29 PM  [facility]  [place], [name], [address] Phone: [phone]   Fax:  [phone]  Name: Amanda Short MRN: [id number] Date of Birth: 01/20/1941

## 2017-09-10 ENCOUNTER — Encounter: Payer: Self-pay | Admitting: Physical Therapy

## 2017-09-10 ENCOUNTER — Ambulatory Visit: Payer: Medicare Other | Admitting: Physical Therapy

## 2017-09-10 DIAGNOSIS — R262 Difficulty in walking, not elsewhere classified: Secondary | ICD-10-CM

## 2017-09-10 DIAGNOSIS — Z9181 History of falling: Secondary | ICD-10-CM

## 2017-09-10 DIAGNOSIS — M6281 Muscle weakness (generalized): Secondary | ICD-10-CM

## 2017-09-10 NOTE — Therapy (Signed)
During this treatment session, the therapist was present, participating in and directing the treatment. [place], [name], [address] Phone: [phone]   Fax:  [phone]  Physical Therapy Treatment  Patient Details  Name: Amanda Short MRN: [id number] Date of Birth: 1941/06/11 Referring Provider: Dr. Sandria Manly   Encounter Date: 09/10/2017  PT End of Session - 09/10/17 1554    Visit Number  2    Date for PT Re-Evaluation  11/03/17    PT Start Time  1510    PT Stop Time  1555    PT Time Calculation (min)  45 min    Activity Tolerance  Patient tolerated treatment well    Behavior During Therapy  [place] for tasks assessed/performed       Past Medical History:  Diagnosis Date  . Arthritis    "back, hands" (07/22/2015)  . Cancer of left breast ([place]) 2006   S/P lumpectomy  . Chronic diastolic CHF (congestive heart failure) ([place]) 07/10/2017  . Complication of anesthesia    "brief breathing problem at surgery center in ~ 2005 when I had gallbladder OR"  . Concussion 03/2013   due to fall  . Depression   . DVT (deep venous thrombosis) ([place]) 03/2013   LLE  . GERD (gastroesophageal reflux disease)   . Hyperlipidemia   . Hypertension   . Hypothyroidism   . OSA treated with BiPAP   . Pulmonary embolism ([place]) 03/2013  . Seizures ([place])   . Thyroid disease    Hypothyroid  . Type II diabetes mellitus ([place])     Past Surgical History:  Procedure Laterality Date  . ABDOMINAL HYSTERECTOMY  1970s  . BREAST BIOPSY Left 2006  . BREAST LUMPECTOMY Left 2006  . BUNIONECTOMY WITH HAMMERTOE RECONSTRUCTION Left   . ESOPHAGOGASTRODUODENOSCOPY N/A 05/29/2017   Procedure: ESOPHAGOGASTRODUODENOSCOPY (EGD);  Surgeon: Irene Shipper, MD;  Location: [facility];  Service: Endoscopy;  Laterality: N/A;  . JOINT REPLACEMENT    . LAPAROSCOPIC CHOLECYSTECTOMY  ~ 2005  . TOTAL HIP ARTHROPLASTY Left 2010  . TOTAL KNEE  ARTHROPLASTY Bilateral 2005-2006    There were no vitals filed for this visit.  Subjective Assessment - 09/10/17 1510    Subjective  Pt. reports not having any stumbles or falls since last seen. Pt. reports not feeling 'whoozy' at all recently. Pt. reports HEP exercises have been going well with no difficulty.    Patient is accompained by:  Family member    Patient Stated Goals  being able to be independent and walk again without a walker    Currently in Pain?  No/denies    Pain Score  0-No pain                      [facility] Adult PT Treatment/Exercise - 09/10/17 0001      Exercises   Exercises  Knee/Hip      Knee/Hip Exercises: Aerobic   Nustep  L4x6 mins      Knee/Hip Exercises: Standing   Hip Flexion  Both;2 sets;10 reps;Limitations    Hip Flexion Limitations  2    Hip Abduction  Both;2 sets;10 reps;Limitations    Abduction Limitations  2    Hip Extension  Both;2 sets;10 reps;Limitations    Extension Limitations  2    Forward Step Up  Both;3 sets;5 reps;Hand Hold: 2;Step Height: 4"      Knee/Hip Exercises: Seated   Long [employer]  Both;2 sets;10 reps;Weights    Long Arc Quad Weight  2 lbs.    Ball Squeeze  2x10    Hamstring Curl  Both;2 sets;10 reps;Limitations    Hamstring Limitations  red tband    Sit to Sand  1 set;10 reps;without UE support fatigued by the end of 10 reps               PT Short Term Goals - 09/10/17 1558      PT SHORT TERM GOAL #1   Title  independent with intial HEP    Time  2    Period  Weeks    Status  On-going        PT Long Term Goals - 09/05/17 1625      PT LONG TERM GOAL #1   Title  increase overall hip LE strength for functional gait and safety    Time  8    Period  Weeks    Status  New      PT LONG TERM GOAL #2   Title  decrease TUG time to less than 15 seconds for functional gait and safety    Baseline  35    Time  8    Period  Weeks    Status  New      PT LONG TERM GOAL #3   Title  increase BERG to  50/56 for safety and independence    Time  8    Period  Weeks    Status  New      PT LONG TERM GOAL #4   Title  gait without an assistive device for independence and QOL    Time  8    Period  Weeks    Status  New            Plan - 09/10/17 1555    Clinical Impression Statement  We worked on lots of strengthening ex today. Pt needs cues with standing hip ABD and hip ext to not go into lateral side bending or forward lumbar flexion. Pt. needs tactile cues sometimes for long arc quads to hold contraction. Pt. felt unsteady during alt forward step ups and demonstrated weakness. Pt. was very fatigued after sit to stand.     PT Frequency  2x / week    PT Duration  8 weeks    PT Treatment/Interventions  Electrical Stimulation;Balance training;Therapeutic exercise;Therapeutic activities;Stair training;Gait training;Patient/family education    PT Next Visit Plan  continue strengthening, start balance and gait training    Consulted and Agree with Plan of Care  Patient       Patient will benefit from skilled therapeutic intervention in order to improve the following deficits and impairments:  Abnormal gait, Decreased activity tolerance, Decreased endurance, Decreased strength, Decreased balance, Difficulty walking  Visit Diagnosis: Difficulty in walking, not elsewhere classified  Muscle weakness (generalized)  Risk for falls     Problem List Patient Active Problem List   Diagnosis Date Noted  . Hypomagnesemia 07/11/2017  . Nausea vomiting and diarrhea 07/10/2017  . Hypokalemia 07/10/2017  . Chronic diastolic CHF (congestive heart failure) ([place]) 07/10/2017  . Seizure ([place]) 07/10/2017  . OSA (obstructive sleep apnea) 07/10/2017  . Sepsis ([place]) 07/10/2017  . Seizure disorder ([place]) 07/05/2017  . Pulmonary HTN ([place]) 07/05/2017  . Anxiety state   . Physical debility 06/23/2017  . 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 History of acute respiratory failure   . Tracheostomy,  acute management ([place])   . Aspiration  pneumonia ([place])   . Encephalopathy   . Status epilepticus ([place])   . Benign essential HTN   . 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 History of pulmonary embolism   . Acute blood loss anemia   . Diverticulitis of colon   . Tachypnea   . Encounter for orogastric (OG) tube placement   . Encounter for central line placement   . Encounter for attention to tracheostomy ([place])   . Acute respiratory failure with hypoxia ([place])   . Acute encephalopathy 06/06/2017  . Diverticulitis of large intestine without perforation or abscess without bleeding   . Abdominal pain   . Non-intractable cyclical vomiting with nausea   . Abnormal CT of the abdomen   . Hypocalcemia 05/27/2017  . 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 Colitis 05/27/2017  . Diabetic foot infection ([place]) 07/21/2015  . Diabetes mellitus type 2, controlled ([place]) 07/21/2015  . Cellulitis of foot, right 07/21/2015  . Cellulitis of right foot 07/21/2015  . Acute pulmonary embolism ([place]) 04/01/2013  . DVT (deep venous thrombosis) ([place]) 04/01/2013  . HX: breast cancer 04/01/2013  . Concussion 04/01/2013  . Diabetes ([place]) 04/01/2013  . Essential hypertension, benign 04/01/2013  . Hypothyroidism 04/01/2013  . OSA on CPAP 04/01/2013    Juliann Pulse SPT 09/10/2017, 3:59 PM  [place], [name], [address] Phone: [phone]   Fax:  [phone]  Name: Amanda Short MRN: [id number] Date of Birth: 10-02-1940

## 2017-09-12 ENCOUNTER — Ambulatory Visit: Payer: Medicare Other | Admitting: Neurology

## 2017-09-13 ENCOUNTER — Ambulatory Visit: Payer: Medicare Other | Admitting: Physical Therapy

## 2017-09-13 DIAGNOSIS — Z9181 History of falling: Secondary | ICD-10-CM

## 2017-09-13 DIAGNOSIS — R262 Difficulty in walking, not elsewhere classified: Secondary | ICD-10-CM

## 2017-09-13 DIAGNOSIS — M6281 Muscle weakness (generalized): Secondary | ICD-10-CM

## 2017-09-13 NOTE — Therapy (Signed)
[place], [name], [address] Phone: [phone]   Fax:  [phone]  Physical Therapy Treatment  Patient Details  Name: Amanda Short MRN: [id number] Date of Birth: 1941/07/11 Referring Provider: Dr. Sandria Manly   Encounter Date: 09/13/2017  PT End of Session - 09/13/17 1655    Visit Number  3    Date for PT Re-Evaluation  11/03/17    PT Start Time  5997    PT Stop Time  7414    PT Time Calculation (min)  44 min       Past Medical History:  Diagnosis Date  . Arthritis    "back, hands" (07/22/2015)  . Cancer of left breast ([place]) 2006   S/P lumpectomy  . Chronic diastolic CHF (congestive heart failure) ([place]) 07/10/2017  . Complication of anesthesia    "brief breathing problem at surgery center in ~ 2005 when I had gallbladder OR"  . Concussion 03/2013   due to fall  . Depression   . DVT (deep venous thrombosis) ([place]) 03/2013   LLE  . GERD (gastroesophageal reflux disease)   . Hyperlipidemia   . Hypertension   . Hypothyroidism   . OSA treated with BiPAP   . Pulmonary embolism ([place]) 03/2013  . Seizures ([place])   . Thyroid disease    Hypothyroid  . Type II diabetes mellitus ([place])     Past Surgical History:  Procedure Laterality Date  . ABDOMINAL HYSTERECTOMY  1970s  . BREAST BIOPSY Left 2006  . BREAST LUMPECTOMY Left 2006  . BUNIONECTOMY WITH HAMMERTOE RECONSTRUCTION Left   . ESOPHAGOGASTRODUODENOSCOPY N/A 05/29/2017   Procedure: ESOPHAGOGASTRODUODENOSCOPY (EGD);  Surgeon: Irene Shipper, MD;  Location: [facility];  Service: Endoscopy;  Laterality: N/A;  . JOINT REPLACEMENT    . LAPAROSCOPIC CHOLECYSTECTOMY  ~ 2005  . TOTAL HIP ARTHROPLASTY Left 2010  . TOTAL KNEE ARTHROPLASTY Bilateral 2005-2006    There were no vitals filed for this visit.  Subjective Assessment - 09/13/17 1616    Subjective  alittle sore after last session esp in the shlds, but I am okay    Currently in  Pain?  No/denies                      [facility] Adult PT Treatment/Exercise - 09/13/17 0001      High Level Balance   High Level Balance Activities  Backward walking;Marching forwards;Side stepping HHA 2 # 20 feet 2 times each      Knee/Hip Exercises: Aerobic   Nustep  L5x6 mins      Knee/Hip Exercises: Standing   Hip Flexion  Stengthening;Both;1 set;20 reps;Knee straight HHA 2# alt     Hip Abduction  Stengthening;Both;1 set;20 reps;Knee straight 2# HHA alt    Hip Extension  Stengthening;Both;1 set;20 reps;Knee straight 2# alt HHA    Forward Step Up  Both;3 sets;5 reps;Hand Hold: 2;Step Height: 4"    Functional Squat  10 reps HHA with calf raise    Other Standing Knee Exercises  2# UE ex 10 reps flex,chest press,abd and bicep curl      Knee/Hip Exercises: Seated   Long Arc Quad  Strengthening;Both;1 set;15 reps red tband    Clamshell with TheraBand  Red    Other Seated Knee/Hip Exercises  hip flex red tband 15 times    Hamstring Curl  Strengthening;Both;1 set;15 reps red tband    Sit to Sand  10 reps;1 set;without UE support wt  ball               PT Short Term Goals - 09/13/17 1656      PT SHORT TERM GOAL #1   Title  independent with intial HEP    Status  Achieved        PT Long Term Goals - 09/05/17 1625      PT LONG TERM GOAL #1   Title  increase overall hip LE strength for functional gait and safety    Time  8    Period  Weeks    Status  New      PT LONG TERM GOAL #2   Title  decrease TUG time to less than 15 seconds for functional gait and safety    Baseline  35    Time  8    Period  Weeks    Status  New      PT LONG TERM GOAL #3   Title  increase BERG to 50/56 for safety and independence    Time  8    Period  Weeks    Status  New      PT LONG TERM GOAL #4   Title  gait without an assistive device for independence and QOL    Time  8    Period  Weeks    Status  New            Plan - 09/13/17 1655    Clinical Impression  Statement  pt tolerated ex with less fatigue than last session. left side weakness noted as compared to RT.     PT Treatment/Interventions  Lobbyist;Therapeutic exercise;Therapeutic activities;Stair training;Gait training;Patient/family education    PT Next Visit Plan  continue strengthening, start balance and gait training       Patient will benefit from skilled therapeutic intervention in order to improve the following deficits and impairments:  Abnormal gait, Decreased activity tolerance, Decreased endurance, Decreased strength, Decreased balance, Difficulty walking  Visit Diagnosis: Difficulty in walking, not elsewhere classified  Muscle weakness (generalized)  Risk for falls     Problem List Patient Active Problem List   Diagnosis Date Noted  . Hypomagnesemia 07/11/2017  . Nausea vomiting and diarrhea 07/10/2017  . Hypokalemia 07/10/2017  . Chronic diastolic CHF (congestive heart failure) ([place]) 07/10/2017  . Seizure ([place]) 07/10/2017  . OSA (obstructive sleep apnea) 07/10/2017  . Sepsis ([place]) 07/10/2017  . Seizure disorder ([place]) 07/05/2017  . Pulmonary HTN ([place]) 07/05/2017  . Anxiety state   . Physical debility 06/23/2017  . 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 History of acute respiratory failure   . Tracheostomy, acute management ([place])   . Aspiration pneumonia ([place])   . Encephalopathy   . Status epilepticus ([place])   . Benign essential HTN   . 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 History of pulmonary embolism   . Acute blood loss anemia   . Diverticulitis of colon   . Tachypnea   . Encounter for orogastric (OG) tube placement   . Encounter for central line placement   . Encounter for attention to tracheostomy ([place])   . Acute respiratory failure with hypoxia ([place])   . Acute encephalopathy 06/06/2017  . Diverticulitis of large intestine without perforation or abscess without bleeding   . Abdominal pain   .  Non-intractable cyclical vomiting with nausea   . Abnormal CT of the abdomen   . Hypocalcemia 05/27/2017  . 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  Colitis 05/27/2017  . Diabetic foot infection ([place]) 07/21/2015  . Diabetes mellitus type 2, controlled ([place]) 07/21/2015  . Cellulitis of foot, right 07/21/2015  . Cellulitis of right foot 07/21/2015  . Acute pulmonary embolism ([place]) 04/01/2013  . DVT (deep venous thrombosis) ([place]) 04/01/2013  . HX: breast cancer 04/01/2013  . Concussion 04/01/2013  . Diabetes ([place]) 04/01/2013  . Essential hypertension, benign 04/01/2013  . Hypothyroidism 04/01/2013  . OSA on CPAP 04/01/2013    Areta Terwilliger,[name] 09/13/2017, 4:57 PM  [place], [name], [address] Phone: [phone]   Fax:  [phone]  Name: Reid Nawrot MRN: [id number] Date of Birth: 07/30/1940

## 2017-09-17 ENCOUNTER — Ambulatory Visit: Payer: Medicare Other | Attending: Family Medicine | Admitting: Physical Therapy

## 2017-09-17 ENCOUNTER — Encounter: Payer: Self-pay | Admitting: Physical Therapy

## 2017-09-17 DIAGNOSIS — Z9181 History of falling: Secondary | ICD-10-CM | POA: Diagnosis present

## 2017-09-17 DIAGNOSIS — R262 Difficulty in walking, not elsewhere classified: Secondary | ICD-10-CM | POA: Diagnosis not present

## 2017-09-17 DIAGNOSIS — M6281 Muscle weakness (generalized): Secondary | ICD-10-CM

## 2017-09-17 NOTE — Therapy (Signed)
[place], [name], [address] Phone: [phone]   Fax:  [phone]  Physical Therapy Treatment  Patient Details  Name: Amanda Short MRN: [id number] Date of Birth: April 17, 1941 Referring Provider: Dr. Sandria Manly   Encounter Date: 09/17/2017  PT End of Session - 09/17/17 1010    Visit Number  4    Date for PT Re-Evaluation  11/03/17    PT Start Time  0930    PT Stop Time  1010    PT Time Calculation (min)  40 min       Past Medical History:  Diagnosis Date  . Arthritis    "back, hands" (07/22/2015)  . Cancer of left breast ([place]) 2006   S/P lumpectomy  . Chronic diastolic CHF (congestive heart failure) ([place]) 07/10/2017  . Complication of anesthesia    "brief breathing problem at surgery center in ~ 2005 when I had gallbladder OR"  . Concussion 03/2013   due to fall  . Depression   . DVT (deep venous thrombosis) ([place]) 03/2013   LLE  . GERD (gastroesophageal reflux disease)   . Hyperlipidemia   . Hypertension   . Hypothyroidism   . OSA treated with BiPAP   . Pulmonary embolism ([place]) 03/2013  . Seizures ([place])   . Thyroid disease    Hypothyroid  . Type II diabetes mellitus ([place])     Past Surgical History:  Procedure Laterality Date  . ABDOMINAL HYSTERECTOMY  1970s  . BREAST BIOPSY Left 2006  . BREAST LUMPECTOMY Left 2006  . BUNIONECTOMY WITH HAMMERTOE RECONSTRUCTION Left   . ESOPHAGOGASTRODUODENOSCOPY N/A 05/29/2017   Procedure: ESOPHAGOGASTRODUODENOSCOPY (EGD);  Surgeon: Irene Shipper, MD;  Location: [facility];  Service: Endoscopy;  Laterality: N/A;  . JOINT REPLACEMENT    . LAPAROSCOPIC CHOLECYSTECTOMY  ~ 2005  . TOTAL HIP ARTHROPLASTY Left 2010  . TOTAL KNEE ARTHROPLASTY Bilateral 2005-2006    There were no vitals filed for this visit.  Subjective Assessment - 09/17/17 0928    Subjective  Pt reports that she is doing well, depending on her walker less.     Currently in  Pain?  No/denies    Pain Score  0-No pain                      [facility] Adult PT Treatment/Exercise - 09/17/17 0001      High Level Balance   High Level Balance Activities  Side stepping;Marching forwards;Marching backwards;Negotiating over obstacles    High Level Balance Comments  side step over half foam roll x5 x3      Knee/Hip Exercises: Aerobic   Nustep  L4 x7 mins      Knee/Hip Exercises: Seated   Long Arc Quad  Strengthening;2 sets;Both;10 reps    Long Arc Quad Weight  2 lbs.    Hamstring Curl  Strengthening;Both;2 sets;10 reps x2    Hamstring Limitations  red tband    Sit to Sand  without UE support;2 sets               PT Short Term Goals - 09/13/17 1656      PT SHORT TERM GOAL #1   Title  independent with intial HEP    Status  Achieved        PT Long Term Goals - 09/05/17 1625      PT LONG TERM GOAL #1   Title  increase overall hip LE strength for functional gait  and safety    Time  8    Period  Weeks    Status  New      PT LONG TERM GOAL #2   Title  decrease TUG time to less than 15 seconds for functional gait and safety    Baseline  35    Time  8    Period  Weeks    Status  New      PT LONG TERM GOAL #3   Title  increase BERG to 50/56 for safety and independence    Time  8    Period  Weeks    Status  New      PT LONG TERM GOAL #4   Title  gait without an assistive device for independence and QOL    Time  8    Period  Weeks    Status  New            Plan - 09/17/17 1010    Clinical Impression Statement  Pt trends to ER her right hip when toe off during gait. CGA to min assist sn stepping over foam roll. Frequent LOB when side stepping over half foam roll with some ability to regain balance. LE fatigue quick with isolation exercises.    Rehab Potential  Good    PT Frequency  2x / week    PT Duration  8 weeks    PT Treatment/Interventions  Electrical Stimulation;Balance training;Therapeutic exercise;Therapeutic  activities;Stair training;Gait training;Patient/family education    PT Next Visit Plan  continue strengthening, start balance and gait training       Patient will benefit from skilled therapeutic intervention in order to improve the following deficits and impairments:  Abnormal gait, Decreased activity tolerance, Decreased endurance, Decreased strength, Decreased balance, Difficulty walking  Visit Diagnosis: Difficulty in walking, not elsewhere classified  Muscle weakness (generalized)  Risk for falls     Problem List Patient Active Problem List   Diagnosis Date Noted  . Hypomagnesemia 07/11/2017  . Nausea vomiting and diarrhea 07/10/2017  . Hypokalemia 07/10/2017  . Chronic diastolic CHF (congestive heart failure) ([place]) 07/10/2017  . Seizure ([place]) 07/10/2017  . OSA (obstructive sleep apnea) 07/10/2017  . Sepsis ([place]) 07/10/2017  . Seizure disorder ([place]) 07/05/2017  . Pulmonary HTN ([place]) 07/05/2017  . Anxiety state   . Physical debility 06/23/2017  . 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 History of acute respiratory failure   . Tracheostomy, acute management ([place])   . Aspiration pneumonia ([place])   . Encephalopathy   . Status epilepticus ([place])   . Benign essential HTN   . 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 History of pulmonary embolism   . Acute blood loss anemia   . Diverticulitis of colon   . Tachypnea   . Encounter for orogastric (OG) tube placement   . Encounter for central line placement   . Encounter for attention to tracheostomy ([place])   . Acute respiratory failure with hypoxia ([place])   . Acute encephalopathy 06/06/2017  . Diverticulitis of large intestine without perforation or abscess without bleeding   . Abdominal pain   . Non-intractable cyclical vomiting with nausea   . Abnormal CT of the abdomen   . Hypocalcemia 05/27/2017  . 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 Colitis 05/27/2017  . Diabetic foot infection ([place]) 07/21/2015  . Diabetes  mellitus type 2, controlled ([place]) 07/21/2015  . Cellulitis of foot, right 07/21/2015  . Cellulitis of right foot 07/21/2015  .  Acute pulmonary embolism ([place]) 04/01/2013  . DVT (deep venous thrombosis) ([place]) 04/01/2013  . HX: breast cancer 04/01/2013  . Concussion 04/01/2013  . Diabetes ([place]) 04/01/2013  . Essential hypertension, benign 04/01/2013  . Hypothyroidism 04/01/2013  . OSA on CPAP 04/01/2013    Willey Blade 09/17/2017, 10:13 AM  [place], [name], [address] Phone: [phone]   Fax:  [phone]  Name: Amanda Short MRN: [id number] Date of Birth: [birth year]

## 2017-09-25 ENCOUNTER — Encounter: Payer: Self-pay | Admitting: Physical Therapy

## 2017-09-25 ENCOUNTER — Ambulatory Visit: Payer: Medicare Other | Admitting: Physical Therapy

## 2017-09-25 DIAGNOSIS — R262 Difficulty in walking, not elsewhere classified: Secondary | ICD-10-CM

## 2017-09-25 DIAGNOSIS — M6281 Muscle weakness (generalized): Secondary | ICD-10-CM

## 2017-09-25 DIAGNOSIS — Z9181 History of falling: Secondary | ICD-10-CM

## 2017-09-25 NOTE — Therapy (Signed)
[place] [place], [name], [address] Phone: [phone]   Fax:  [phone]  Physical Therapy Treatment  Patient Details  Name: Amanda Short MRN: [id number] Date of Birth: 09-22-1940 Referring Provider: Dr. Sandria Manly   Encounter Date: 09/25/2017  PT End of Session - 09/25/17 1528    Visit Number  5    Date for PT Re-Evaluation  11/03/17    PT Start Time  1435    PT Stop Time  1515    PT Time Calculation (min)  40 min    Activity Tolerance  Patient tolerated treatment well;Patient limited by fatigue    Behavior During Therapy  [facility] for tasks assessed/performed       Past Medical History:  Diagnosis Date  . Arthritis    "back, hands" (07/22/2015)  . Cancer of left breast ([place]) 2006   S/P lumpectomy  . Chronic diastolic CHF (congestive heart failure) ([place]) 07/10/2017  . Complication of anesthesia    "brief breathing problem at surgery center in ~ 2005 when I had gallbladder OR"  . Concussion 03/2013   due to fall  . Depression   . DVT (deep venous thrombosis) ([place]) 03/2013   LLE  . GERD (gastroesophageal reflux disease)   . Hyperlipidemia   . Hypertension   . Hypothyroidism   . OSA treated with BiPAP   . Pulmonary embolism ([place]) 03/2013  . Seizures ([place])   . Thyroid disease    Hypothyroid  . Type II diabetes mellitus ([place])     Past Surgical History:  Procedure Laterality Date  . ABDOMINAL HYSTERECTOMY  1970s  . BREAST BIOPSY Left 2006  . BREAST LUMPECTOMY Left 2006  . BUNIONECTOMY WITH HAMMERTOE RECONSTRUCTION Left   . ESOPHAGOGASTRODUODENOSCOPY N/A 05/29/2017   Procedure: ESOPHAGOGASTRODUODENOSCOPY (EGD);  Surgeon: Irene Shipper, MD;  Location: [facility];  Service: Endoscopy;  Laterality: N/A;  . JOINT REPLACEMENT    . LAPAROSCOPIC CHOLECYSTECTOMY  ~ 2005  . TOTAL HIP ARTHROPLASTY Left 2010  . TOTAL KNEE ARTHROPLASTY Bilateral 2005-2006    There were no vitals filed for this  visit.  Subjective Assessment - 09/25/17 1439    Subjective  Patient reports that overall she is doing better.  Patient continues to report unsteadiness at times and fear.      Currently in Pain?  No/denies         [facility] PT Assessment - 09/25/17 0001      Timed Up and Go Test   Normal TUG (seconds)  21                  [facility] Adult PT Treatment/Exercise - 09/25/17 0001      Ambulation/Gait   Gait Comments  gait around building on the outside, slow, unsteady with any head movements to look down or around.  She needed 4 rest breaks, very slow and short of breath on the uphill, needed HHA for most of the walk due to unsteadiness, tended to lose balance when looking around, staggered to the right      High Level Balance   High Level Balance Comments  on airex balance head turns, eyes closed, very close supervision due to loss of balance      Vestibular Treatment/Exercise - 09/25/17 0001      Vestibular Treatment/Exercise   Gaze Exercises  X1 Viewing Horizontal;X1 Viewing Vertical;Eye/Head Exercise Horizontal      X1 Viewing Horizontal   Foot Position  sitting  Reps  10      X1 Viewing Vertical   Foot Position  sitting    Reps  10      Eye/Head Exercise Horizontal   Foot Position  sitting    Reps  10              PT Short Term Goals - 09/13/17 1656      PT SHORT TERM GOAL #1   Title  independent with intial HEP    Status  Achieved        PT Long Term Goals - 09/25/17 1530      PT LONG TERM GOAL #2   Title  decrease TUG time to less than 15 seconds for functional gait and safety    Status  Partially Met      PT LONG TERM GOAL #4   Title  gait without an assistive device for independence and QOL    Status  Partially Met            Plan - 09/25/17 1528    Clinical Impression Statement  good improvement in decreasing TUG time.  She was very intimidated with [place] walking around the building, needed 4 rests, HHA, tended to lose balance to  the right with any head motions.  I added VOR exercises today in sitting    PT Next Visit Plan  see if VOR exercises were okay, progress balance    Consulted and Agree with Plan of Care  Patient       Patient will benefit from skilled therapeutic intervention in order to improve the following deficits and impairments:  Abnormal gait, Decreased activity tolerance, Decreased endurance, Decreased strength, Decreased balance, Difficulty walking  Visit Diagnosis: Difficulty in walking, not elsewhere classified  Muscle weakness (generalized)  Risk for falls     Problem List Patient Active Problem List   Diagnosis Date Noted  . Hypomagnesemia 07/11/2017  . Nausea vomiting and diarrhea 07/10/2017  . Hypokalemia 07/10/2017  . Chronic diastolic CHF (congestive heart failure) ([place]) 07/10/2017  . Seizure ([place]) 07/10/2017  . OSA (obstructive sleep apnea) 07/10/2017  . Sepsis ([place]) 07/10/2017  . Seizure disorder ([place]) 07/05/2017  . Pulmonary HTN ([place]) 07/05/2017  . Anxiety state   . Physical debility 06/23/2017  . 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 History of acute respiratory failure   . Tracheostomy, acute management ([place])   . Aspiration pneumonia ([place])   . Encephalopathy   . Status epilepticus ([place])   . Benign essential HTN   . 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 History of pulmonary embolism   . Acute blood loss anemia   . Diverticulitis of colon   . Tachypnea   . Encounter for orogastric (OG) tube placement   . Encounter for central line placement   . Encounter for attention to tracheostomy ([place])   . Acute respiratory failure with hypoxia ([place])   . Acute encephalopathy 06/06/2017  . Diverticulitis of large intestine without perforation or abscess without bleeding   . Abdominal pain   . Non-intractable cyclical vomiting with nausea   . Abnormal CT of the abdomen   . Hypocalcemia 05/27/2017  . 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 Colitis 05/27/2017  .  Diabetic foot infection ([place]) 07/21/2015  . Diabetes mellitus type 2, controlled ([place]) 07/21/2015  . Cellulitis of foot, right 07/21/2015  . Cellulitis of right foot 07/21/2015  . Acute pulmonary embolism ([place]) 04/01/2013  . DVT (deep venous thrombosis) ([place]) 04/01/2013  .  HX: breast cancer 04/01/2013  . Concussion 04/01/2013  . Diabetes ([place]) 04/01/2013  . Essential hypertension, benign 04/01/2013  . Hypothyroidism 04/01/2013  . OSA on CPAP 04/01/2013    [name] Boast., PT 09/25/2017, 3:30 PM  [place], [name], [address] Phone: [phone]   Fax:  [phone]  Name: Amanda Short MRN: [id number] Date of Birth: 1941/05/11

## 2017-09-27 ENCOUNTER — Ambulatory Visit: Payer: Medicare Other | Admitting: Physical Therapy

## 2017-09-27 ENCOUNTER — Encounter: Payer: Self-pay | Admitting: Physical Therapy

## 2017-09-27 DIAGNOSIS — Z9181 History of falling: Secondary | ICD-10-CM

## 2017-09-27 DIAGNOSIS — R262 Difficulty in walking, not elsewhere classified: Secondary | ICD-10-CM

## 2017-09-27 DIAGNOSIS — M6281 Muscle weakness (generalized): Secondary | ICD-10-CM

## 2017-09-27 NOTE — Therapy (Signed)
[place], [name], [address] Phone: [phone]   Fax:  [phone]  Physical Therapy Treatment  Patient Details  Name: Amanda Short MRN: [id number] Date of Birth: [birth year] Referring Provider: Dr. Sandria Manly   Encounter Date: 09/27/2017  PT End of Session - 09/27/17 1533    Visit Number  6    Date for PT Re-Evaluation  11/03/17    PT Start Time  1445    PT Stop Time  1530    PT Time Calculation (min)  45 min    Activity Tolerance  Patient tolerated treatment well;Patient limited by fatigue    Behavior During Therapy  [facility] for tasks assessed/performed       Past Medical History:  Diagnosis Date  . Arthritis    "back, hands" (07/22/2015)  . Cancer of left breast ([place]) 2006   S/P lumpectomy  . Chronic diastolic CHF (congestive heart failure) ([place]) 07/10/2017  . Complication of anesthesia    "brief breathing problem at surgery center in ~ 2005 when I had gallbladder OR"  . Concussion 03/2013   due to fall  . Depression   . DVT (deep venous thrombosis) ([place]) 03/2013   LLE  . GERD (gastroesophageal reflux disease)   . Hyperlipidemia   . Hypertension   . Hypothyroidism   . OSA treated with BiPAP   . Pulmonary embolism ([place]) 03/2013  . Seizures ([place])   . Thyroid disease    Hypothyroid  . Type II diabetes mellitus ([place])     Past Surgical History:  Procedure Laterality Date  . ABDOMINAL HYSTERECTOMY  1970s  . BREAST BIOPSY Left 2006  . BREAST LUMPECTOMY Left 2006  . BUNIONECTOMY WITH HAMMERTOE RECONSTRUCTION Left   . ESOPHAGOGASTRODUODENOSCOPY N/A 05/29/2017   Procedure: ESOPHAGOGASTRODUODENOSCOPY (EGD);  Surgeon: Irene Shipper, MD;  Location: [facility];  Service: Endoscopy;  Laterality: N/A;  . JOINT REPLACEMENT    . LAPAROSCOPIC CHOLECYSTECTOMY  ~ 2005  . TOTAL HIP ARTHROPLASTY Left 2010  . TOTAL KNEE ARTHROPLASTY Bilateral 2005-2006    There were no vitals filed for this  visit.  Subjective Assessment - 09/27/17 1448    Subjective  Reports that she was very tired after the walk last time, mild dizziness with the VOR exercises    Currently in Pain?  No/denies                      [facility] Adult PT Treatment/Exercise - 09/27/17 0001      Ambulation/Gait   Gait Comments  gait while asking her questions to look up and around, asking time and how many fingers, she loses balance at times looking up and around.  Had her walk outside and negotiate a curb, walk across an uneven surface and then negotiate curb up and down again      High Level Balance   High Level Balance Activities  Side stepping;Marching forwards;Marching backwards;Negotiating over obstacles;Tandem walking    High Level Balance Comments  on airex ball toss      Knee/Hip Exercises: Aerobic   Nustep  L5x 6 minutes      Knee/Hip Exercises: Standing   Hip Abduction  Stengthening;Both;1 set;20 reps;Knee straight               PT Short Term Goals - 09/13/17 1656      PT SHORT TERM GOAL #1   Title  independent with intial HEP    Status  Achieved  PT Long Term Goals - 09/25/17 1530      PT LONG TERM GOAL #2   Title  decrease TUG time to less than 15 seconds for functional gait and safety    Status  Partially Met      PT LONG TERM GOAL #4   Title  gait without an assistive device for independence and QOL    Status  Partially Met            Plan - 09/27/17 1533    Clinical Impression Statement  Patient doing better, loss of balance with head turns and looking around, did well on new airex wtih ball toss, one LOB where needed min A to right.    PT Next Visit Plan  continue to work on balance and strength    Consulted and Agree with Plan of Care  Patient       Patient will benefit from skilled therapeutic intervention in order to improve the following deficits and impairments:  Abnormal gait, Decreased activity tolerance, Decreased endurance, Decreased  strength, Decreased balance, Difficulty walking  Visit Diagnosis: Difficulty in walking, not elsewhere classified  Muscle weakness (generalized)  Risk for falls     Problem List Patient Active Problem List   Diagnosis Date Noted  . Hypomagnesemia 07/11/2017  . Nausea vomiting and diarrhea 07/10/2017  . Hypokalemia 07/10/2017  . Chronic diastolic CHF (congestive heart failure) ([place]) 07/10/2017  . Seizure ([place]) 07/10/2017  . OSA (obstructive sleep apnea) 07/10/2017  . Sepsis ([place]) 07/10/2017  . Seizure disorder ([place]) 07/05/2017  . Pulmonary HTN ([place]) 07/05/2017  . Anxiety state   . Physical debility 06/23/2017  . 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 History of acute respiratory failure   . Tracheostomy, acute management ([place])   . Aspiration pneumonia ([place])   . Encephalopathy   . Status epilepticus ([place])   . Benign essential HTN   . 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 History of pulmonary embolism   . Acute blood loss anemia   . Diverticulitis of colon   . Tachypnea   . Encounter for orogastric (OG) tube placement   . Encounter for central line placement   . Encounter for attention to tracheostomy ([place])   . Acute respiratory failure with hypoxia ([place])   . Acute encephalopathy 06/06/2017  . Diverticulitis of large intestine without perforation or abscess without bleeding   . Abdominal pain   . Non-intractable cyclical vomiting with nausea   . Abnormal CT of the abdomen   . Hypocalcemia 05/27/2017  . 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 Colitis 05/27/2017  . Diabetic foot infection ([place]) 07/21/2015  . Diabetes mellitus type 2, controlled ([place]) 07/21/2015  . Cellulitis of foot, right 07/21/2015  . Cellulitis of right foot 07/21/2015  . Acute pulmonary embolism ([place]) 04/01/2013  . DVT (deep venous thrombosis) ([place]) 04/01/2013  . HX: breast cancer 04/01/2013  . Concussion 04/01/2013  . Diabetes ([place]) 04/01/2013  . Essential hypertension, benign  04/01/2013  . Hypothyroidism 04/01/2013  . OSA on CPAP 04/01/2013    [name] Boast., PT 09/27/2017, 3:35 PM  [place], [name], [address] Phone: [phone]   Fax:  [phone]  Name: Amanda Short MRN: [id number] Date of Birth: March 16, 1941

## 2017-10-05 ENCOUNTER — Ambulatory Visit: Payer: Medicare Other | Admitting: Physical Therapy

## 2017-10-05 ENCOUNTER — Encounter: Payer: Self-pay | Admitting: Physical Therapy

## 2017-10-05 DIAGNOSIS — Z9181 History of falling: Secondary | ICD-10-CM

## 2017-10-05 DIAGNOSIS — R262 Difficulty in walking, not elsewhere classified: Secondary | ICD-10-CM | POA: Diagnosis not present

## 2017-10-05 DIAGNOSIS — M6281 Muscle weakness (generalized): Secondary | ICD-10-CM

## 2017-10-05 NOTE — Therapy (Signed)
[place], [name], [address] Phone: [phone]   Fax:  [phone]  Physical Therapy Treatment  Patient Details  Name: Amanda Short MRN: [id number] Date of Birth: [birth year] Referring Provider: Dr. Sandria Manly   Encounter Date: 10/05/2017  PT End of Session - 10/05/17 1054    Visit Number  7    Date for PT Re-Evaluation  11/03/17    PT Start Time  1252    PT Stop Time  1100    PT Time Calculation (min)  45 min    Activity Tolerance  Patient tolerated treatment well    Behavior During Therapy  [facility] for tasks assessed/performed       Past Medical History:  Diagnosis Date  . Arthritis    "back, hands" (07/22/2015)  . Cancer of left breast ([place]) 2006   S/P lumpectomy  . Chronic diastolic CHF (congestive heart failure) ([place]) 07/10/2017  . Complication of anesthesia    "brief breathing problem at surgery center in ~ 2005 when I had gallbladder OR"  . Concussion 03/2013   due to fall  . Depression   . DVT (deep venous thrombosis) ([place]) 03/2013   LLE  . GERD (gastroesophageal reflux disease)   . Hyperlipidemia   . Hypertension   . Hypothyroidism   . OSA treated with BiPAP   . Pulmonary embolism ([place]) 03/2013  . Seizures ([place])   . Thyroid disease    Hypothyroid  . Type II diabetes mellitus ([place])     Past Surgical History:  Procedure Laterality Date  . ABDOMINAL HYSTERECTOMY  1970s  . BREAST BIOPSY Left 2006  . BREAST LUMPECTOMY Left 2006  . BUNIONECTOMY WITH HAMMERTOE RECONSTRUCTION Left   . ESOPHAGOGASTRODUODENOSCOPY N/A 05/29/2017   Procedure: ESOPHAGOGASTRODUODENOSCOPY (EGD);  Surgeon: Irene Shipper, MD;  Location: [facility];  Service: Endoscopy;  Laterality: N/A;  . JOINT REPLACEMENT    . LAPAROSCOPIC CHOLECYSTECTOMY  ~ 2005  . TOTAL HIP ARTHROPLASTY Left 2010  . TOTAL KNEE ARTHROPLASTY Bilateral 2005-2006    There were no vitals filed for this visit.  Subjective  Assessment - 10/05/17 1017    Subjective  Patient reports that she was "wobbly" all day yesterday.  No problems with the VOR exercises.  Just feel weak today    Currently in Pain?  No/denies                      [facility] Adult PT Treatment/Exercise - 10/05/17 0001      High Level Balance   High Level Balance Activities  Side stepping;Tandem walking;Backward walking      Knee/Hip Exercises: Aerobic   Nustep  L5x 6 minutes      Knee/Hip Exercises: Machines for Strengthening   Cybex Knee Extension  5# 2x10    Cybex Knee Flexion  15# 3x10      Knee/Hip Exercises: Standing   Hip Flexion  Stengthening;Both;2 sets;10 reps;Knee bent    Hip Flexion Limitations  2    Hip Abduction  Stengthening;Both;Knee straight;2 sets;10 reps    Hip Extension  Stengthening;Both;Knee straight;2 sets;10 reps      Knee/Hip Exercises: Seated   Sit to Sand  15 reps;without UE support holding 4# ball               PT Short Term Goals - 09/13/17 1656      PT SHORT TERM GOAL #1   Title  independent with intial HEP  Status  Achieved        PT Long Term Goals - 10/05/17 1055      PT LONG TERM GOAL #1   Title  increase overall hip LE strength for functional gait and safety    Status  On-going      PT LONG TERM GOAL #2   Title  decrease TUG time to less than 15 seconds for functional gait and safety    Status  Partially Met            Plan - 10/05/17 1054    Clinical Impression Statement  Patient continues to have some loss of balance when she is moving and looks around.  She is very weak and slow with the exercises.  Requiring close supervision to some CGA due to poor balance    PT Next Visit Plan  continue to work on balance and strength    Consulted and Agree with Plan of Care  Patient       Patient will benefit from skilled therapeutic intervention in order to improve the following deficits and impairments:  Abnormal gait, Decreased activity tolerance, Decreased  endurance, Decreased strength, Decreased balance, Difficulty walking  Visit Diagnosis: Difficulty in walking, not elsewhere classified  Muscle weakness (generalized)  Risk for falls     Problem List Patient Active Problem List   Diagnosis Date Noted  . Hypomagnesemia 07/11/2017  . Nausea vomiting and diarrhea 07/10/2017  . Hypokalemia 07/10/2017  . Chronic diastolic CHF (congestive heart failure) ([place]) 07/10/2017  . Seizure ([place]) 07/10/2017  . OSA (obstructive sleep apnea) 07/10/2017  . Sepsis ([place]) 07/10/2017  . Seizure disorder ([place]) 07/05/2017  . Pulmonary HTN ([place]) 07/05/2017  . Anxiety state   . Physical debility 06/23/2017  . 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 History of acute respiratory failure   . Tracheostomy, acute management ([place])   . Aspiration pneumonia ([place])   . Encephalopathy   . Status epilepticus ([place])   . Benign essential HTN   . 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 History of pulmonary embolism   . Acute blood loss anemia   . Diverticulitis of colon   . Tachypnea   . Encounter for orogastric (OG) tube placement   . Encounter for central line placement   . Encounter for attention to tracheostomy ([place])   . Acute respiratory failure with hypoxia ([place])   . Acute encephalopathy 06/06/2017  . Diverticulitis of large intestine without perforation or abscess without bleeding   . Abdominal pain   . Non-intractable cyclical vomiting with nausea   . Abnormal CT of the abdomen   . Hypocalcemia 05/27/2017  . 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 Colitis 05/27/2017  . Diabetic foot infection ([place]) 07/21/2015  . Diabetes mellitus type 2, controlled ([place]) 07/21/2015  . Cellulitis of foot, right 07/21/2015  . Cellulitis of right foot 07/21/2015  . Acute pulmonary embolism ([place]) 04/01/2013  . DVT (deep venous thrombosis) ([place]) 04/01/2013  . HX: breast cancer 04/01/2013  . Concussion 04/01/2013  . Diabetes ([place]) 04/01/2013  . Essential  hypertension, benign 04/01/2013  . Hypothyroidism 04/01/2013  . OSA on CPAP 04/01/2013    [name] Boast., PT 10/05/2017, 10:57 AM  [place], [name], [address] Phone: [phone]   Fax:  [phone]  Name: Amanda Short MRN: [id number] Date of Birth: January 21, 1941

## 2017-10-08 ENCOUNTER — Ambulatory Visit: Payer: Medicare Other | Admitting: Physical Therapy

## 2017-10-08 DIAGNOSIS — Z9181 History of falling: Secondary | ICD-10-CM

## 2017-10-08 DIAGNOSIS — R262 Difficulty in walking, not elsewhere classified: Secondary | ICD-10-CM

## 2017-10-08 DIAGNOSIS — M6281 Muscle weakness (generalized): Secondary | ICD-10-CM

## 2017-10-08 NOTE — Therapy (Signed)
[place], [name], [address] Phone: [phone]   Fax:  [phone]  Physical Therapy Treatment  Patient Details  Name: Amanda Short MRN: [id number] Date of Birth: 27-Dec-1940 Referring Provider: Dr. Sandria Manly   Encounter Date: 10/08/2017  PT End of Session - 10/08/17 1138    Visit Number  8    Date for PT Re-Evaluation  11/03/17    PT Start Time  1057    PT Stop Time  1139    PT Time Calculation (min)  42 min       Past Medical History:  Diagnosis Date  . Arthritis    "back, hands" (07/22/2015)  . Cancer of left breast ([place]) 2006   S/P lumpectomy  . Chronic diastolic CHF (congestive heart failure) ([place]) 07/10/2017  . Complication of anesthesia    "brief breathing problem at surgery center in ~ 2005 when I had gallbladder OR"  . Concussion 03/2013   due to fall  . Depression   . DVT (deep venous thrombosis) ([place]) 03/2013   LLE  . GERD (gastroesophageal reflux disease)   . Hyperlipidemia   . Hypertension   . Hypothyroidism   . OSA treated with BiPAP   . Pulmonary embolism ([place]) 03/2013  . Seizures ([place])   . Thyroid disease    Hypothyroid  . Type II diabetes mellitus ([place])     Past Surgical History:  Procedure Laterality Date  . ABDOMINAL HYSTERECTOMY  1970s  . BREAST BIOPSY Left 2006  . BREAST LUMPECTOMY Left 2006  . BUNIONECTOMY WITH HAMMERTOE RECONSTRUCTION Left   . ESOPHAGOGASTRODUODENOSCOPY N/A 05/29/2017   Procedure: ESOPHAGOGASTRODUODENOSCOPY (EGD);  Surgeon: Irene Shipper, MD;  Location: [facility];  Service: Endoscopy;  Laterality: N/A;  . JOINT REPLACEMENT    . LAPAROSCOPIC CHOLECYSTECTOMY  ~ 2005  . TOTAL HIP ARTHROPLASTY Left 2010  . TOTAL KNEE ARTHROPLASTY Bilateral 2005-2006    There were no vitals filed for this visit.  Subjective Assessment - 10/08/17 1101    Subjective  getting stronger, I think    Currently in Pain?  No/denies                 No data recorded       [facility] Adult PT Treatment/Exercise - 10/08/17 0001      High Level Balance   High Level Balance Activities  Side stepping;Marching forwards;Marching backwards red tband HHA      Knee/Hip Exercises: Aerobic   Nustep  L5x 6 minutes      Knee/Hip Exercises: Machines for Strengthening   Cybex Knee Extension  5# 2x12    Cybex Knee Flexion  20# 2 x 12      Knee/Hip Exercises: Standing   Lateral Step Up  Both;Hand Hold: 2;Step Height: 6";10 reps decreased balance    Forward Step Up  Both;10 reps;Hand Hold: 2;Step Height: 6" decreased balance    Other Standing Knee Exercises  red tband HHA 10 reps hip flex.ext and abd    Other Standing Knee Exercises  6 inch alt step tap 20 2 sets min A d/t decreased balance      Knee/Hip Exercises: Seated   Sit to Sand  15 reps;without UE support wt ball               PT Short Term Goals - 09/13/17 1656      PT SHORT TERM GOAL #1   Title  independent with intial HEP  Status  Achieved        PT Long Term Goals - 10/05/17 1055      PT LONG TERM GOAL #1   Title  increase overall hip LE strength for functional gait and safety    Status  On-going      PT LONG TERM GOAL #2   Title  decrease TUG time to less than 15 seconds for functional gait and safety    Status  Partially Met            Plan - 10/08/17 1138    Clinical Impression Statement  LOB and assistance needed with step ups and step tap. fatigued with standing ex.    PT Treatment/Interventions  Lobbyist;Therapeutic exercise;Therapeutic activities;Stair training;Gait training;Patient/family education    PT Next Visit Plan  continue to work on balance and strength- assess goals       Patient will benefit from skilled therapeutic intervention in order to improve the following deficits and impairments:  Abnormal gait, Decreased activity tolerance, Decreased endurance, Decreased strength,  Decreased balance, Difficulty walking  Visit Diagnosis: Muscle weakness (generalized)  Difficulty in walking, not elsewhere classified  Risk for falls     Problem List Patient Active Problem List   Diagnosis Date Noted  . Hypomagnesemia 07/11/2017  . Nausea vomiting and diarrhea 07/10/2017  . Hypokalemia 07/10/2017  . Chronic diastolic CHF (congestive heart failure) ([place]) 07/10/2017  . Seizure ([place]) 07/10/2017  . OSA (obstructive sleep apnea) 07/10/2017  . Sepsis ([place]) 07/10/2017  . Seizure disorder ([place]) 07/05/2017  . Pulmonary HTN ([place]) 07/05/2017  . Anxiety state   . Physical debility 06/23/2017  . 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 History of acute respiratory failure   . Tracheostomy, acute management ([place])   . Aspiration pneumonia ([place])   . Encephalopathy   . Status epilepticus ([place])   . Benign essential HTN   . 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 History of pulmonary embolism   . Acute blood loss anemia   . Diverticulitis of colon   . Tachypnea   . Encounter for orogastric (OG) tube placement   . Encounter for central line placement   . Encounter for attention to tracheostomy ([place])   . Acute respiratory failure with hypoxia ([place])   . Acute encephalopathy 06/06/2017  . Diverticulitis of large intestine without perforation or abscess without bleeding   . Abdominal pain   . Non-intractable cyclical vomiting with nausea   . Abnormal CT of the abdomen   . Hypocalcemia 05/27/2017  . 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 Colitis 05/27/2017  . Diabetic foot infection ([place]) 07/21/2015  . Diabetes mellitus type 2, controlled ([place]) 07/21/2015  . Cellulitis of foot, right 07/21/2015  . Cellulitis of right foot 07/21/2015  . Acute pulmonary embolism ([place]) 04/01/2013  . DVT (deep venous thrombosis) ([place]) 04/01/2013  . HX: breast cancer 04/01/2013  . Concussion 04/01/2013  . Diabetes ([place]) 04/01/2013  . Essential hypertension, benign 04/01/2013  .  Hypothyroidism 04/01/2013  . OSA on CPAP 04/01/2013    [name],[name] 10/08/2017, 11:41 AM  [place], [name], [address] Phone: [phone]   Fax:  [phone]  Name: Amanda Short MRN: [id number] Date of Birth: [birth year]

## 2017-10-11 ENCOUNTER — Ambulatory Visit: Payer: Medicare Other | Admitting: Physical Therapy

## 2017-10-11 DIAGNOSIS — Z9181 History of falling: Secondary | ICD-10-CM

## 2017-10-11 DIAGNOSIS — R262 Difficulty in walking, not elsewhere classified: Secondary | ICD-10-CM

## 2017-10-11 DIAGNOSIS — M6281 Muscle weakness (generalized): Secondary | ICD-10-CM

## 2017-10-11 NOTE — Therapy (Signed)
[place], [name], [address] Phone: [phone]   Fax:  [phone]  Physical Therapy Treatment  Patient Details  Name: Amanda Short MRN: [id number] Date of Birth: 02/15/1941 Referring Provider: Dr. Sandria Manly   Encounter Date: 10/11/2017  PT End of Session - 10/11/17 1604    Visit Number  9    Date for PT Re-Evaluation  11/03/17    PT Start Time  1525    PT Stop Time  1610    PT Time Calculation (min)  45 min       Past Medical History:  Diagnosis Date  . Arthritis    "back, hands" (07/22/2015)  . Cancer of left breast ([place]) 2006   S/P lumpectomy  . Chronic diastolic CHF (congestive heart failure) ([place]) 07/10/2017  . Complication of anesthesia    "brief breathing problem at surgery center in ~ 2005 when I had gallbladder OR"  . Concussion 03/2013   due to fall  . Depression   . DVT (deep venous thrombosis) ([place]) 03/2013   LLE  . GERD (gastroesophageal reflux disease)   . Hyperlipidemia   . Hypertension   . Hypothyroidism   . OSA treated with BiPAP   . Pulmonary embolism ([place]) 03/2013  . Seizures ([place])   . Thyroid disease    Hypothyroid  . Type II diabetes mellitus ([place])     Past Surgical History:  Procedure Laterality Date  . ABDOMINAL HYSTERECTOMY  1970s  . BREAST BIOPSY Left 2006  . BREAST LUMPECTOMY Left 2006  . BUNIONECTOMY WITH HAMMERTOE RECONSTRUCTION Left   . ESOPHAGOGASTRODUODENOSCOPY N/A 05/29/2017   Procedure: ESOPHAGOGASTRODUODENOSCOPY (EGD);  Surgeon: Irene Shipper, MD;  Location: [facility];  Service: Endoscopy;  Laterality: N/A;  . JOINT REPLACEMENT    . LAPAROSCOPIC CHOLECYSTECTOMY  ~ 2005  . TOTAL HIP ARTHROPLASTY Left 2010  . TOTAL KNEE ARTHROPLASTY Bilateral 2005-2006    There were no vitals filed for this visit.  Subjective Assessment - 10/11/17 1530    Subjective  did some housework yesterday and it wore me out- had to take breaks    Currently in  Pain?  No/denies                No data recorded       [facility] Adult PT Treatment/Exercise - 10/11/17 0001      High Level Balance   High Level Balance Activities  Side stepping;Marching forwards;Marching backwards;Negotitating around obstacles;Negotiating over obstacles HHA 3# with mrching and side stepping    High Level Balance Comments  on airex ball toss min A LOB but better righting reaction      Knee/Hip Exercises: Aerobic   Nustep  L5x 7 minutes      Knee/Hip Exercises: Machines for Strengthening   Cybex Knee Extension  5# 2x12    Cybex Knee Flexion  20# 2 x 12      Knee/Hip Exercises: Standing   Hip Flexion  Stengthening;Both;1 set;10 reps;Knee bent;Knee straight 3# HHA    Hip Abduction  Stengthening;Both;1 set;10 reps;Knee straight 3# HHA    Hip Extension  Stengthening;Both;1 set;10 reps 3# HHA    Other Standing Knee Exercises  6 inch alt step tap 20 2 sets with 3#      Knee/Hip Exercises: Seated   Sit to Sand  20 reps;without UE support 3# various UE ex               PT Short Term Goals -  09/13/17 1656      PT SHORT TERM GOAL #1   Title  independent with intial HEP    Status  Achieved        PT Long Term Goals - 10/11/17 1530      PT LONG TERM GOAL #1   Title  increase overall hip LE strength for functional gait and safety    Status  Partially Met      PT LONG TERM GOAL #2   Title  decrease TUG time to less than 15 seconds for functional gait and safety    Baseline  TUG without AD 12 sec      PT LONG TERM GOAL #3   Title  increase BERG to 50/56 for safety and independence    Status  On-going      PT LONG TERM GOAL #4   Title  gait without an assistive device for independence and QOL    Status  Achieved            Plan - 10/11/17 1604    Clinical Impression Statement  TUG goal met and progressing with other LTGs. less fatigue noted by decreased rest and tolerated 3# vs 2. LOB with ball toss but noted increased righting  reaction    PT Treatment/Interventions  Electrical Stimulation;Balance training;Therapeutic exercise;Therapeutic activities;Stair training;Gait training;Patient/family education    PT Next Visit Plan  continue to work on balance and strength/endurance       Patient will benefit from skilled therapeutic intervention in order to improve the following deficits and impairments:  Abnormal gait, Decreased activity tolerance, Decreased endurance, Decreased strength, Decreased balance, Difficulty walking  Visit Diagnosis: Muscle weakness (generalized)  Difficulty in walking, not elsewhere classified  Risk for falls     Problem List Patient Active Problem List   Diagnosis Date Noted  . Hypomagnesemia 07/11/2017  . Nausea vomiting and diarrhea 07/10/2017  . Hypokalemia 07/10/2017  . Chronic diastolic CHF (congestive heart failure) ([place]) 07/10/2017  . Seizure ([place]) 07/10/2017  . OSA (obstructive sleep apnea) 07/10/2017  . Sepsis ([place]) 07/10/2017  . Seizure disorder ([place]) 07/05/2017  . Pulmonary HTN ([place]) 07/05/2017  . Anxiety state   . Physical debility 06/23/2017  . 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 History of acute respiratory failure   . Tracheostomy, acute management ([place])   . Aspiration pneumonia ([place])   . Encephalopathy   . Status epilepticus ([place])   . Benign essential HTN   . 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 History of pulmonary embolism   . Acute blood loss anemia   . Diverticulitis of colon   . Tachypnea   . Encounter for orogastric (OG) tube placement   . Encounter for central line placement   . Encounter for attention to tracheostomy ([place])   . Acute respiratory failure with hypoxia ([place])   . Acute encephalopathy 06/06/2017  . Diverticulitis of large intestine without perforation or abscess without bleeding   . Abdominal pain   . Non-intractable cyclical vomiting with nausea   . Abnormal CT of the abdomen   . Hypocalcemia 05/27/2017  .  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 Colitis 05/27/2017  . Diabetic foot infection ([place]) 07/21/2015  . Diabetes mellitus type 2, controlled ([place]) 07/21/2015  . Cellulitis of foot, right 07/21/2015  . Cellulitis of right foot 07/21/2015  . Acute pulmonary embolism ([place]) 04/01/2013  . DVT (deep venous thrombosis) ([place]) 04/01/2013  . HX: breast cancer 04/01/2013  . Concussion 04/01/2013  .  Diabetes ([place]) 04/01/2013  . Essential hypertension, benign 04/01/2013  . Hypothyroidism 04/01/2013  . OSA on CPAP 04/01/2013    Makylee Sanborn,[name]  10/11/2017, 4:06 PM  [place], [name], [address] Phone: [phone]   Fax:  [phone]  Name: Amanda Short MRN: [id number] Date of Birth: 17-Aug-1940

## 2017-10-16 ENCOUNTER — Ambulatory Visit: Payer: Medicare Other | Attending: Family Medicine | Admitting: Physical Therapy

## 2017-10-16 DIAGNOSIS — M6281 Muscle weakness (generalized): Secondary | ICD-10-CM | POA: Insufficient documentation

## 2017-10-16 DIAGNOSIS — Z9181 History of falling: Secondary | ICD-10-CM | POA: Diagnosis present

## 2017-10-16 DIAGNOSIS — R262 Difficulty in walking, not elsewhere classified: Secondary | ICD-10-CM

## 2017-10-16 NOTE — Therapy (Signed)
[place], [name], [address] Phone: [phone]   Fax:  [phone]  Physical Therapy Treatment  Patient Details  Name: Amanda Short MRN: [id number] Date of Birth: Nov 27, 1940 Referring Provider: Dr. Sandria Manly   Encounter Date: 10/16/2017  PT End of Session - 10/16/17 1452    Date for PT Re-Evaluation  11/03/17       Past Medical History:  Diagnosis Date  . Arthritis    "back, hands" (07/22/2015)  . Cancer of left breast ([place]) 2006   S/P lumpectomy  . Chronic diastolic CHF (congestive heart failure) ([place]) 07/10/2017  . Complication of anesthesia    "brief breathing problem at surgery center in ~ 2005 when I had gallbladder OR"  . Concussion 03/2013   due to fall  . Depression   . DVT (deep venous thrombosis) ([place]) 03/2013   LLE  . GERD (gastroesophageal reflux disease)   . Hyperlipidemia   . Hypertension   . Hypothyroidism   . OSA treated with BiPAP   . Pulmonary embolism ([place]) 03/2013  . Seizures ([place])   . Thyroid disease    Hypothyroid  . Type II diabetes mellitus ([place])     Past Surgical History:  Procedure Laterality Date  . ABDOMINAL HYSTERECTOMY  1970s  . BREAST BIOPSY Left 2006  . BREAST LUMPECTOMY Left 2006  . BUNIONECTOMY WITH HAMMERTOE RECONSTRUCTION Left   . ESOPHAGOGASTRODUODENOSCOPY N/A 05/29/2017   Procedure: ESOPHAGOGASTRODUODENOSCOPY (EGD);  Surgeon: Irene Shipper, MD;  Location: [facility];  Service: Endoscopy;  Laterality: N/A;  . JOINT REPLACEMENT    . LAPAROSCOPIC CHOLECYSTECTOMY  ~ 2005  . TOTAL HIP ARTHROPLASTY Left 2010  . TOTAL KNEE ARTHROPLASTY Bilateral 2005-2006    There were no vitals filed for this visit.  Subjective Assessment - 10/16/17 1400    Subjective  Pt is doing well, has been able to do more housework lately.    Currently in Pain?  No/denies                       [facility] Adult PT Treatment/Exercise - 10/16/17 0001       High Level Balance   High Level Balance Activities  Side stepping;Marching forwards;Marching backwards;Negotitating around obstacles;Negotiating over obstacles    High Level Balance Comments  on airex ball toss; step over and back with both legs      Knee/Hip Exercises: Aerobic   Nustep  L4x 56mn      Knee/Hip Exercises: Machines for Strengthening   Cybex Knee Extension  20# 2x12    Cybex Knee Flexion  25# 2x12      Knee/Hip Exercises: Seated   Sit to Sand  20 reps;without UE support               PT Short Term Goals - 09/13/17 1656      PT SHORT TERM GOAL #1   Title  independent with intial HEP    Status  Achieved        PT Long Term Goals - 10/11/17 1530      PT LONG TERM GOAL #1   Title  increase overall hip LE strength for functional gait and safety    Status  Partially Met      PT LONG TERM GOAL #2   Title  decrease TUG time to less than 15 seconds for functional gait and safety    Baseline  TUG without AD 12 sec  PT LONG TERM GOAL #3   Title  increase BERG to 50/56 for safety and independence    Status  On-going      PT LONG TERM GOAL #4   Title  gait without an assistive device for independence and QOL    Status  Achieved            Plan - 10/16/17 1448    Clinical Impression Statement  Pt tolerated increased weights today and continued to focus on functional balance activities which are still difficult for patient and she requires assistance.     PT Treatment/Interventions  Lobbyist;Therapeutic exercise;Therapeutic activities;Stair training;Gait training;Patient/family education    PT Next Visit Plan  continue to work on balance and strength/endurance       Patient will benefit from skilled therapeutic intervention in order to improve the following deficits and impairments:  Abnormal gait, Decreased activity tolerance, Decreased endurance, Decreased strength, Decreased balance, Difficulty walking  Visit  Diagnosis: Muscle weakness (generalized)  Difficulty in walking, not elsewhere classified  Risk for falls     Problem List Patient Active Problem List   Diagnosis Date Noted  . Hypomagnesemia 07/11/2017  . Nausea vomiting and diarrhea 07/10/2017  . Hypokalemia 07/10/2017  . Chronic diastolic CHF (congestive heart failure) ([place]) 07/10/2017  . Seizure ([place]) 07/10/2017  . OSA (obstructive sleep apnea) 07/10/2017  . Sepsis ([place]) 07/10/2017  . Seizure disorder ([place]) 07/05/2017  . Pulmonary HTN ([place]) 07/05/2017  . Anxiety state   . Physical debility 06/23/2017  . 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 History of acute respiratory failure   . Tracheostomy, acute management ([place])   . Aspiration pneumonia ([place])   . Encephalopathy   . Status epilepticus ([place])   . Benign essential HTN   . 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 History of pulmonary embolism   . Acute blood loss anemia   . Diverticulitis of colon   . Tachypnea   . Encounter for orogastric (OG) tube placement   . Encounter for central line placement   . Encounter for attention to tracheostomy ([place])   . Acute respiratory failure with hypoxia ([place])   . Acute encephalopathy 06/06/2017  . Diverticulitis of large intestine without perforation or abscess without bleeding   . Abdominal pain   . Non-intractable cyclical vomiting with nausea   . Abnormal CT of the abdomen   . Hypocalcemia 05/27/2017  . 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 Colitis 05/27/2017  . Diabetic foot infection ([place]) 07/21/2015  . Diabetes mellitus type 2, controlled ([place]) 07/21/2015  . Cellulitis of foot, right 07/21/2015  . Cellulitis of right foot 07/21/2015  . Acute pulmonary embolism ([place]) 04/01/2013  . DVT (deep venous thrombosis) ([place]) 04/01/2013  . HX: breast cancer 04/01/2013  . Concussion 04/01/2013  . Diabetes ([place]) 04/01/2013  . Essential hypertension, benign 04/01/2013  . Hypothyroidism 04/01/2013  . OSA on CPAP  04/01/2013    Lamiyah Schlotter,[name] 10/16/2017, 2:53 PM  [place], [name], [address] Phone: [phone]   Fax:  [phone]  Name: Concetta Guion MRN: [id number] Date of Birth: September 03, 1940

## 2017-10-18 ENCOUNTER — Ambulatory Visit: Payer: Medicare Other | Admitting: Physical Therapy

## 2017-10-18 DIAGNOSIS — Z9181 History of falling: Secondary | ICD-10-CM

## 2017-10-18 DIAGNOSIS — M6281 Muscle weakness (generalized): Secondary | ICD-10-CM

## 2017-10-18 DIAGNOSIS — R262 Difficulty in walking, not elsewhere classified: Secondary | ICD-10-CM

## 2017-10-18 NOTE — Therapy (Signed)
[place], [name], [address] Phone: [phone]   Fax:  [phone]  Physical Therapy Treatment  Patient Details  Name: Amanda Short MRN: [id number] Date of Birth: 06/15/1941 Referring Provider: Dr. Sandria Manly   Encounter Date: 10/18/2017  PT End of Session - 10/18/17 1703    Visit Number  11    Date for PT Re-Evaluation  11/03/17    PT Start Time  1610    PT Stop Time  1700    PT Time Calculation (min)  50 min    Activity Tolerance  Patient tolerated treatment well       Past Medical History:  Diagnosis Date  . Arthritis    "back, hands" (07/22/2015)  . Cancer of left breast ([place]) 2006   S/P lumpectomy  . Chronic diastolic CHF (congestive heart failure) ([facility] [place]) 07/10/2017  . Complication of anesthesia    "brief breathing problem at surgery center in ~ 2005 when I had gallbladder OR"  . Concussion 03/2013   due to fall  . Depression   . DVT (deep venous thrombosis) ([facility]) 03/2013   LLE  . GERD (gastroesophageal reflux disease)   . Hyperlipidemia   . Hypertension   . Hypothyroidism   . OSA treated with BiPAP   . Pulmonary embolism ([place]) 03/2013  . Seizures ([place])   . Thyroid disease    Hypothyroid  . Type II diabetes mellitus ([place])     Past Surgical History:  Procedure Laterality Date  . ABDOMINAL HYSTERECTOMY  1970s  . BREAST BIOPSY Left 2006  . BREAST LUMPECTOMY Left 2006  . BUNIONECTOMY WITH HAMMERTOE RECONSTRUCTION Left   . ESOPHAGOGASTRODUODENOSCOPY N/A 05/29/2017   Procedure: ESOPHAGOGASTRODUODENOSCOPY (EGD);  Surgeon: Irene Shipper, MD;  Location: [facility];  Service: Endoscopy;  Laterality: N/A;  . JOINT REPLACEMENT    . LAPAROSCOPIC CHOLECYSTECTOMY  ~ 2005  . TOTAL HIP ARTHROPLASTY Left 2010  . TOTAL KNEE ARTHROPLASTY Bilateral 2005-2006    There were no vitals filed for this visit.  Subjective Assessment - 10/18/17 1613    Subjective  Pt is doing very well today  and is not sore from previous PT session    Currently in Pain?  No/denies    Pain Score  0-No pain                       [facility] Adult PT Treatment/Exercise - 10/18/17 0001      Knee/Hip Exercises: Aerobic   Recumbent Bike  L3 x 6 min      Knee/Hip Exercises: Machines for Strengthening   Cybex Knee Extension  10lb 2x15    Cybex Knee Flexion  20lb 2x15      Knee/Hip Exercises: Standing   Other Standing Knee Exercises  3lb ankle weights forward and backwards march and side stepping cuing needed for proper technique      Knee/Hip Exercises: Seated   Ball Squeeze  2x10    Clamshell with TheraBand  Blue 20 reps needed significant cuing to maintain proper techniqu               PT Short Term Goals - 09/13/17 1656      PT SHORT TERM GOAL #1   Title  independent with intial HEP    Status  Achieved        PT Long Term Goals - 10/11/17 1530      PT LONG TERM GOAL #1   Title  increase overall hip LE strength for functional gait and safety    Status  Partially Met      PT LONG TERM GOAL #2   Title  decrease TUG time to less than 15 seconds for functional gait and safety    Baseline  TUG without AD 12 sec      PT LONG TERM GOAL #3   Title  increase BERG to 50/56 for safety and independence    Status  On-going      PT LONG TERM GOAL #4   Title  gait without an assistive device for independence and QOL    Status  Achieved            Plan - 10/18/17 1704    Clinical Impression Statement  Pt required less assistance with functional balance activities however she still needed cuing for proper technique for LE strengthening exercises. noted decreased balance as she fatigued. progressing with goals    PT Frequency  2x / week    PT Duration  8 weeks    PT Next Visit Plan  continue to work on functional balance exercises and strength and endurance. BERG BALANCE       Patient will benefit from skilled therapeutic intervention in order to improve the  following deficits and impairments:  Abnormal gait, Decreased activity tolerance, Decreased endurance, Decreased strength, Decreased balance, Difficulty walking  Visit Diagnosis: Muscle weakness (generalized)  Difficulty in walking, not elsewhere classified  Risk for falls     Problem List Patient Active Problem List   Diagnosis Date Noted  . Hypomagnesemia 07/11/2017  . Nausea vomiting and diarrhea 07/10/2017  . Hypokalemia 07/10/2017  . Chronic diastolic CHF (congestive heart failure) ([place]) 07/10/2017  . Seizure ([place]) 07/10/2017  . OSA (obstructive sleep apnea) 07/10/2017  . Sepsis ([place]) 07/10/2017  . Seizure disorder ([place]) 07/05/2017  . Pulmonary HTN ([place]) 07/05/2017  . Anxiety state   . Physical debility 06/23/2017  . 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 History of acute respiratory failure   . Tracheostomy, acute management ([place])   . Aspiration pneumonia ([place])   . Encephalopathy   . Status epilepticus ([place])   . Benign essential HTN   . 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 History of pulmonary embolism   . Acute blood loss anemia   . Diverticulitis of colon   . Tachypnea   . Encounter for orogastric (OG) tube placement   . Encounter for central line placement   . Encounter for attention to tracheostomy ([place])   . Acute respiratory failure with hypoxia ([place])   . Acute encephalopathy 06/06/2017  . Diverticulitis of large intestine without perforation or abscess without bleeding   . Abdominal pain   . Non-intractable cyclical vomiting with nausea   . Abnormal CT of the abdomen   . Hypocalcemia 05/27/2017  . 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 Colitis 05/27/2017  . Diabetic foot infection ([place]) 07/21/2015  . Diabetes mellitus type 2, controlled ([place]) 07/21/2015  . Cellulitis of foot, right 07/21/2015  . Cellulitis of right foot 07/21/2015  . Acute pulmonary embolism ([place]) 04/01/2013  . DVT (deep venous thrombosis) ([place]) 04/01/2013  . HX: breast  cancer 04/01/2013  . Concussion 04/01/2013  . Diabetes ([place]) 04/01/2013  . Essential hypertension, benign 04/01/2013  . Hypothyroidism 04/01/2013  . OSA on CPAP 04/01/2013    Zelig Gacek,[name] 10/18/2017, 5:11 PM  [place], [name],  [address] Phone: [phone]   Fax:  [phone]  Name: Amanda Short MRN: [id number] Date of Birth: 1941-04-07

## 2017-10-23 ENCOUNTER — Ambulatory Visit: Payer: Medicare Other | Admitting: Physical Therapy

## 2017-10-23 DIAGNOSIS — M6281 Muscle weakness (generalized): Secondary | ICD-10-CM

## 2017-10-23 DIAGNOSIS — R262 Difficulty in walking, not elsewhere classified: Secondary | ICD-10-CM

## 2017-10-23 DIAGNOSIS — Z9181 History of falling: Secondary | ICD-10-CM

## 2017-10-23 NOTE — Therapy (Signed)
[place], [name], [address] Phone: [phone]   Fax:  [phone]  Physical Therapy Treatment  Patient Details  Name: Amanda Short MRN: [id number] Date of Birth: 1941-05-29 Referring Provider: Dr. Sandria Manly   Encounter Date: 10/23/2017  PT End of Session - 10/23/17 1424    Visit Number  12    Date for PT Re-Evaluation  11/03/17    PT Start Time  1400    PT Stop Time  1455    PT Time Calculation (min)  55 min       Past Medical History:  Diagnosis Date  . Arthritis    "back, hands" (07/22/2015)  . Cancer of left breast ([facility]) 2006   S/P lumpectomy  . Chronic diastolic CHF (congestive heart failure) ([place]) 07/10/2017  . Complication of anesthesia    "brief breathing problem at surgery center in ~ 2005 when I had gallbladder OR"  . Concussion 03/2013   due to fall  . Depression   . DVT (deep venous thrombosis) ([place]) 03/2013   LLE  . GERD (gastroesophageal reflux disease)   . Hyperlipidemia   . Hypertension   . Hypothyroidism   . OSA treated with BiPAP   . Pulmonary embolism ([place]) 03/2013  . Seizures ([place])   . Thyroid disease    Hypothyroid  . Type II diabetes mellitus ([place])     Past Surgical History:  Procedure Laterality Date  . ABDOMINAL HYSTERECTOMY  1970s  . BREAST BIOPSY Left 2006  . BREAST LUMPECTOMY Left 2006  . BUNIONECTOMY WITH HAMMERTOE RECONSTRUCTION Left   . ESOPHAGOGASTRODUODENOSCOPY N/A 05/29/2017   Procedure: ESOPHAGOGASTRODUODENOSCOPY (EGD);  Surgeon: Irene Shipper, MD;  Location: [facility];  Service: Endoscopy;  Laterality: N/A;  . JOINT REPLACEMENT    . LAPAROSCOPIC CHOLECYSTECTOMY  ~ 2005  . TOTAL HIP ARTHROPLASTY Left 2010  . TOTAL KNEE ARTHROPLASTY Bilateral 2005-2006    There were no vitals filed for this visit.  Subjective Assessment - 10/23/17 1406    Subjective  more tired today, up in kitchen all day yesterday    Currently in Pain?  No/denies                        [facility] Adult PT Treatment/Exercise - 10/23/17 0001      Ambulation/Gait   Gait Comments  gait around the building neg incline/declines and curbs 6 standing rest breaks 7 min 19 for outside building      Standardized Balance Assessment   Standardized Balance Assessment  Berg Balance Test      Berg Balance Test   Sit to Stand  Able to stand without using hands and stabilize independently    Standing Unsupported  Able to stand safely 2 minutes    Sitting with Back Unsupported but Feet Supported on Floor or Stool  Able to sit safely and securely 2 minutes    Stand to Sit  Sits safely with minimal use of hands    Transfers  Able to transfer safely, minor use of hands    Standing Unsupported with Eyes Closed  Able to stand 10 seconds safely    Standing Ubsupported with Feet Together  Able to place feet together independently and stand 1 minute safely    From Standing, Reach Forward with Outstretched Arm  Can reach forward >12 cm safely (5")    From Standing Position, Pick up Object from Floor  Able to pick up  shoe safely and easily    From Standing Position, Turn to Look Behind Over each Shoulder  Looks behind from both sides and weight shifts well    Turn 360 Degrees  Able to turn 360 degrees safely in 4 seconds or less    Standing Unsupported, Alternately Place Feet on Step/Stool  Able to complete 4 steps without aid or supervision    Standing Unsupported, One Foot in Front  Needs help to step but can hold 15 seconds    Standing on One Leg  Tries to lift leg/unable to hold 3 seconds but remains standing independently    Total Score  47      High Level Balance   High Level Balance Activities  Tandem walking SLS and alt step tapping      Knee/Hip Exercises: Aerobic   Nustep  L 5 6 min               PT Short Term Goals - 09/13/17 1656      PT SHORT TERM GOAL #1   Title  independent with intial HEP    Status  Achieved        PT Long  Term Goals - 10/23/17 1418      PT LONG TERM GOAL #1   Title  increase overall hip LE strength for functional gait and safety    Status  Partially Met      PT LONG TERM GOAL #2   Title  decrease TUG time to less than 15 seconds for functional gait and safety    Status  Achieved      PT LONG TERM GOAL #3   Title  increase BERG to 50/56 for safety and independence    Baseline  47/56    Status  Partially Met      PT LONG TERM GOAL #4   Title  gait without an assistive device for independence and QOL    Status  Achieved            Plan - 10/23/17 1424    Clinical Impression Statement  BERG 47/56 so porgressing towards goal of 50/56. LE weakness and fatigue are still and issue. High level balance require assistance and decreasing BERG score. Pt tolerates tx well but does show fatigue and weakness in LE esp with higher level actvities    PT Treatment/Interventions  Electrical Stimulation;Balance training;Therapeutic exercise;Therapeutic activities;Stair training;Gait training;Patient/family education    PT Next Visit Plan  continue to work on functional balance exercises and strength and endurance. Discussed recerting therapy for another month next week and family agreed       Patient will benefit from skilled therapeutic intervention in order to improve the following deficits and impairments:  Abnormal gait, Decreased activity tolerance, Decreased endurance, Decreased strength, Decreased balance, Difficulty walking  Visit Diagnosis: Muscle weakness (generalized)  Difficulty in walking, not elsewhere classified  Risk for falls     Problem List Patient Active Problem List   Diagnosis Date Noted  . Hypomagnesemia 07/11/2017  . Nausea vomiting and diarrhea 07/10/2017  . Hypokalemia 07/10/2017  . Chronic diastolic CHF (congestive heart failure) ([place]) 07/10/2017  . Seizure ([place]) 07/10/2017  . OSA (obstructive sleep apnea) 07/10/2017  . Sepsis ([place]) 07/10/2017  . Seizure  disorder ([place]) 07/05/2017  . Pulmonary HTN ([place]) 07/05/2017  . Anxiety state   . Physical debility 06/23/2017  . 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 History of acute respiratory failure   . Tracheostomy, acute management ([place])   .  Aspiration pneumonia ([place])   . Encephalopathy   . Status epilepticus ([place])   . Benign essential HTN   . 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 History of pulmonary embolism   . Acute blood loss anemia   . Diverticulitis of colon   . Tachypnea   . Encounter for orogastric (OG) tube placement   . Encounter for central line placement   . Encounter for attention to tracheostomy ([place])   . Acute respiratory failure with hypoxia ([place])   . Acute encephalopathy 06/06/2017  . Diverticulitis of large intestine without perforation or abscess without bleeding   . Abdominal pain   . Non-intractable cyclical vomiting with nausea   . Abnormal CT of the abdomen   . Hypocalcemia 05/27/2017  . 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 Colitis 05/27/2017  . Diabetic foot infection ([place]) 07/21/2015  . Diabetes mellitus type 2, controlled ([place]) 07/21/2015  . Cellulitis of foot, right 07/21/2015  . Cellulitis of right foot 07/21/2015  . Acute pulmonary embolism ([place]) 04/01/2013  . DVT (deep venous thrombosis) ([place]) 04/01/2013  . HX: breast cancer 04/01/2013  . Concussion 04/01/2013  . Diabetes ([place]) 04/01/2013  . Essential hypertension, benign 04/01/2013  . Hypothyroidism 04/01/2013  . OSA on CPAP 04/01/2013    Melik Blancett,[name] 10/23/2017, 2:43 PM  [place], [name], [address] Phone: [phone]   Fax:  [phone]  Name: Amanda Short MRN: [id number] Date of Birth: 1941/03/24

## 2017-10-25 ENCOUNTER — Ambulatory Visit: Payer: Medicare Other | Admitting: Physical Therapy

## 2017-10-25 DIAGNOSIS — R262 Difficulty in walking, not elsewhere classified: Secondary | ICD-10-CM

## 2017-10-25 DIAGNOSIS — Z9181 History of falling: Secondary | ICD-10-CM

## 2017-10-25 DIAGNOSIS — M6281 Muscle weakness (generalized): Secondary | ICD-10-CM | POA: Diagnosis not present

## 2017-10-25 NOTE — Therapy (Signed)
[place], [name], [address] Phone: [phone]   Fax:  [phone]  Physical Therapy Treatment  Patient Details  Name: Amanda Short MRN: [id number] Date of Birth: 11/10/1940 Referring Provider: Dr. Sandria Manly   Encounter Date: 10/25/2017  PT End of Session - 10/25/17 1519    Visit Number  13    Date for PT Re-Evaluation  11/03/17    PT Start Time  1445    PT Stop Time  1540    PT Time Calculation (min)  55 min       Past Medical History:  Diagnosis Date  . Arthritis    "back, hands" (07/22/2015)  . Cancer of left breast ([place]) 2006   S/P lumpectomy  . Chronic diastolic CHF (congestive heart failure) ([place]) 07/10/2017  . Complication of anesthesia    "brief breathing problem at surgery center in ~ 2005 when I had gallbladder OR"  . Concussion 03/2013   due to fall  . Depression   . DVT (deep venous thrombosis) ([place]) 03/2013   LLE  . GERD (gastroesophageal reflux disease)   . Hyperlipidemia   . Hypertension   . Hypothyroidism   . OSA treated with BiPAP   . Pulmonary embolism ([place]) 03/2013  . Seizures ([place])   . Thyroid disease    Hypothyroid  . Type II diabetes mellitus ([place])     Past Surgical History:  Procedure Laterality Date  . ABDOMINAL HYSTERECTOMY  1970s  . BREAST BIOPSY Left 2006  . BREAST LUMPECTOMY Left 2006  . BUNIONECTOMY WITH HAMMERTOE RECONSTRUCTION Left   . ESOPHAGOGASTRODUODENOSCOPY N/A 05/29/2017   Procedure: ESOPHAGOGASTRODUODENOSCOPY (EGD);  Surgeon: Irene Shipper, MD;  Location: [facility];  Service: Endoscopy;  Laterality: N/A;  . JOINT REPLACEMENT    . LAPAROSCOPIC CHOLECYSTECTOMY  ~ 2005  . TOTAL HIP ARTHROPLASTY Left 2010  . TOTAL KNEE ARTHROPLASTY Bilateral 2005-2006    There were no vitals filed for this visit.  Subjective Assessment - 10/25/17 1452    Subjective  didn't work in [place] all day so not as tired    Currently in Pain?  No/denies                        [facility] Adult PT Treatment/Exercise - 10/25/17 0001      Knee/Hip Exercises: Aerobic   Nustep  L 5 7 min      Knee/Hip Exercises: Machines for Strengthening   Cybex Knee Extension  10lb 2x15    Cybex Knee Flexion  20lb 2x15    Cybex Leg Press  20# 2 sets 15             PT Education - 10/25/17 1454    Education provided  Yes    Education Details  HEP for Balance    Person(s) Educated  Patient    Methods  Explanation;Handout    Comprehension  Verbalized understanding;Returned demonstration       PT Short Term Goals - 09/13/17 1656      PT SHORT TERM GOAL #1   Title  independent with intial HEP    Status  Achieved        PT Long Term Goals - 10/23/17 1418      PT LONG TERM GOAL #1   Title  increase overall hip LE strength for functional gait and safety    Status  Partially Met      PT LONG TERM GOAL #  2   Title  decrease TUG time to less than 15 seconds for functional gait and safety    Status  Achieved      PT LONG TERM GOAL #3   Title  increase BERG to 50/56 for safety and independence    Baseline  47/56    Status  Partially Met      PT LONG TERM GOAL #4   Title  gait without an assistive device for independence and QOL    Status  Achieved            Plan - 10/25/17 1520    Clinical Impression Statement  focus session on est HEP for strength/endurance and balance- cuing for safety with balance ex    PT Treatment/Interventions  Electrical Stimulation;Balance training;Therapeutic exercise;Therapeutic activities;Stair training;Gait training;Patient/family education    PT Next Visit Plan  continue to work on functional balance exercises and strength and endurance. Recert       Patient will benefit from skilled therapeutic intervention in order to improve the following deficits and impairments:  Abnormal gait, Decreased activity tolerance, Decreased endurance, Decreased strength, Decreased balance, Difficulty  walking  Visit Diagnosis: Muscle weakness (generalized)  Difficulty in walking, not elsewhere classified  Risk for falls     Problem List Patient Active Problem List   Diagnosis Date Noted  . Hypomagnesemia 07/11/2017  . Nausea vomiting and diarrhea 07/10/2017  . Hypokalemia 07/10/2017  . Chronic diastolic CHF (congestive heart failure) ([place]) 07/10/2017  . Seizure ([place]) 07/10/2017  . OSA (obstructive sleep apnea) 07/10/2017  . Sepsis ([place]) 07/10/2017  . Seizure disorder ([place]) 07/05/2017  . Pulmonary HTN ([place]) 07/05/2017  . Anxiety state   . Physical debility 06/23/2017  . 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 History of acute respiratory failure   . Tracheostomy, acute management ([place])   . Aspiration pneumonia ([place])   . Encephalopathy   . Status epilepticus ([place])   . Benign essential HTN   . 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 History of pulmonary embolism   . Acute blood loss anemia   . Diverticulitis of colon   . Tachypnea   . Encounter for orogastric (OG) tube placement   . Encounter for central line placement   . Encounter for attention to tracheostomy ([place])   . Acute respiratory failure with hypoxia ([place])   . Acute encephalopathy 06/06/2017  . Diverticulitis of large intestine without perforation or abscess without bleeding   . Abdominal pain   . Non-intractable cyclical vomiting with nausea   . Abnormal CT of the abdomen   . Hypocalcemia 05/27/2017  . 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 Colitis 05/27/2017  . Diabetic foot infection ([place]) 07/21/2015  . Diabetes mellitus type 2, controlled ([place]) 07/21/2015  . Cellulitis of foot, right 07/21/2015  . Cellulitis of right foot 07/21/2015  . Acute pulmonary embolism ([place]) 04/01/2013  . DVT (deep venous thrombosis) ([place]) 04/01/2013  . HX: breast cancer 04/01/2013  . Concussion 04/01/2013  . Diabetes ([place]) 04/01/2013  . Essential hypertension, benign 04/01/2013  . Hypothyroidism 04/01/2013  . OSA  on CPAP 04/01/2013    Nathanie Ottley,[name] 10/25/2017, 3:27 PM  [place], [name], [address] Phone: [phone]   Fax:  [phone]  Name: Amanda Short MRN: [id number] Date of Birth: [birth year]

## 2017-10-31 ENCOUNTER — Encounter: Payer: Self-pay | Admitting: Physical Therapy

## 2017-10-31 ENCOUNTER — Ambulatory Visit: Payer: Medicare Other | Admitting: Physical Therapy

## 2017-10-31 DIAGNOSIS — M6281 Muscle weakness (generalized): Secondary | ICD-10-CM | POA: Diagnosis not present

## 2017-10-31 DIAGNOSIS — R262 Difficulty in walking, not elsewhere classified: Secondary | ICD-10-CM

## 2017-10-31 NOTE — Therapy (Signed)
[place], [name], [address] Phone: [phone]   Fax:  [phone]  Physical Therapy Treatment  Patient Details  Name: Amanda Short MRN: [id number] Date of Birth: 1940-10-27 Referring Provider: Dr. Sandria Manly   Encounter Date: 10/31/2017  PT End of Session - 10/31/17 1347    Visit Number  14    Date for PT Re-Evaluation  11/03/17    PT Start Time  1300    PT Stop Time  1345    PT Time Calculation (min)  45 min    Activity Tolerance  Patient tolerated treatment well    Behavior During Therapy  [facility] for tasks assessed/performed       Past Medical History:  Diagnosis Date  . Arthritis    "back, hands" (07/22/2015)  . Cancer of left breast ([place]) 2006   S/P lumpectomy  . Chronic diastolic CHF (congestive heart failure) ([place]) 07/10/2017  . Complication of anesthesia    "brief breathing problem at surgery center in ~ 2005 when I had gallbladder OR"  . Concussion 03/2013   due to fall  . Depression   . DVT (deep venous thrombosis) ([place]) 03/2013   LLE  . GERD (gastroesophageal reflux disease)   . Hyperlipidemia   . Hypertension   . Hypothyroidism   . OSA treated with BiPAP   . Pulmonary embolism ([place]) 03/2013  . Seizures ([place])   . Thyroid disease    Hypothyroid  . Type II diabetes mellitus ([place])     Past Surgical History:  Procedure Laterality Date  . ABDOMINAL HYSTERECTOMY  1970s  . BREAST BIOPSY Left 2006  . BREAST LUMPECTOMY Left 2006  . BUNIONECTOMY WITH HAMMERTOE RECONSTRUCTION Left   . ESOPHAGOGASTRODUODENOSCOPY N/A 05/29/2017   Procedure: ESOPHAGOGASTRODUODENOSCOPY (EGD);  Surgeon: Irene Shipper, MD;  Location: [facility];  Service: Endoscopy;  Laterality: N/A;  . JOINT REPLACEMENT    . LAPAROSCOPIC CHOLECYSTECTOMY  ~ 2005  . TOTAL HIP ARTHROPLASTY Left 2010  . TOTAL KNEE ARTHROPLASTY Bilateral 2005-2006    There were no vitals filed for this visit.  Subjective  Assessment - 10/31/17 1301    Subjective  Patient reports that she is doing well, and that she went to the doctors on monday who said "Everything is fine, "I'm pleased."    Patient is accompained by:  Family member                       [facility] Adult PT Treatment/Exercise - 10/31/17 0001      Knee/Hip Exercises: Aerobic   Nustep  L5 26mn      Knee/Hip Exercises: Standing   Heel Raises  1 set;15 reps;Both    Lateral Step Up  Both;2 sets;10 reps alternating tapping.    Functional Squat  15 reps;1 set HHA with chair behind, vc needed for proper technique    Stairs  2x LOB x2, 2 Restbreaks    Other Standing Knee Exercises  Resisted Gait 20lb:4x right, 3x left, 3x forward LOB 2x,                PT Short Term Goals - 09/13/17 1656      PT SHORT TERM GOAL #1   Title  independent with intial HEP    Status  Achieved        PT Long Term Goals - 10/23/17 1418      PT LONG TERM GOAL #1   Title  increase  overall hip LE strength for functional gait and safety    Status  Partially Met      PT LONG TERM GOAL #2   Title  decrease TUG time to less than 15 seconds for functional gait and safety    Status  Achieved      PT LONG TERM GOAL #3   Title  increase BERG to 50/56 for safety and independence    Baseline  47/56    Status  Partially Met      PT LONG TERM GOAL #4   Title  gait without an assistive device for independence and QOL    Status  Achieved            Plan - 10/31/17 1348    Clinical Impression Statement  Pt progressed to stairs and tolerated it well, taking 3 rest breaks during climbing stairs. She required VC and HHA during functional squats for proper technique. Pt she reports not doing exercises due to her being so busy, but will attempt to do some exercises over the weekend.    Clinical Presentation  Evolving    Clinical Decision Making  Low    Rehab Potential  Good    PT Frequency  2x / week    PT Duration  8 weeks    PT  Treatment/Interventions  Electrical Stimulation;Balance training;Therapeutic exercise;Therapeutic activities;Stair training;Gait training;Patient/family education    PT Next Visit Plan  Continue with LE strengthening exercises       Patient will benefit from skilled therapeutic intervention in order to improve the following deficits and impairments:  Abnormal gait, Decreased activity tolerance, Decreased endurance, Decreased strength, Decreased balance, Difficulty walking  Visit Diagnosis: Muscle weakness (generalized)  Difficulty in walking, not elsewhere classified     Problem List Patient Active Problem List   Diagnosis Date Noted  . Hypomagnesemia 07/11/2017  . Nausea vomiting and diarrhea 07/10/2017  . Hypokalemia 07/10/2017  . Chronic diastolic CHF (congestive heart failure) ([place]) 07/10/2017  . Seizure ([place]) 07/10/2017  . OSA (obstructive sleep apnea) 07/10/2017  . Sepsis ([place]) 07/10/2017  . Seizure disorder ([place]) 07/05/2017  . Pulmonary HTN ([place]) 07/05/2017  . Anxiety state   . Physical debility 06/23/2017  . 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 History of acute respiratory failure   . Tracheostomy, acute management ([place])   . Aspiration pneumonia ([place])   . Encephalopathy   . Status epilepticus ([place])   . Benign essential HTN   . 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 History of pulmonary embolism   . Acute blood loss anemia   . Diverticulitis of colon   . Tachypnea   . Encounter for orogastric (OG) tube placement   . Encounter for central line placement   . Encounter for attention to tracheostomy ([place])   . Acute respiratory failure with hypoxia ([place])   . Acute encephalopathy 06/06/2017  . Diverticulitis of large intestine without perforation or abscess without bleeding   . Abdominal pain   . Non-intractable cyclical vomiting with nausea   . Abnormal CT of the abdomen   . Hypocalcemia 05/27/2017  . 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 Colitis  05/27/2017  . Diabetic foot infection ([place]) 07/21/2015  . Diabetes mellitus type 2, controlled ([place]) 07/21/2015  . Cellulitis of foot, right 07/21/2015  . Cellulitis of right foot 07/21/2015  . Acute pulmonary embolism ([place]) 04/01/2013  . DVT (deep venous thrombosis) ([place]) 04/01/2013  . HX: breast cancer 04/01/2013  . Concussion 04/01/2013  .  Diabetes ([place]) 04/01/2013  . Essential hypertension, benign 04/01/2013  . Hypothyroidism 04/01/2013  . OSA on CPAP 04/01/2013    Loyal Gambler 10/31/2017, 1:59 PM  [place], [name], [address] Phone: [phone]   Fax:  [phone]  Name: Amanda Short MRN: [id number] Date of Birth: 1941-06-13

## 2017-11-06 ENCOUNTER — Encounter: Payer: Self-pay | Admitting: Physical Therapy

## 2017-11-06 ENCOUNTER — Ambulatory Visit: Payer: Medicare Other | Admitting: Physical Therapy

## 2017-11-06 DIAGNOSIS — R262 Difficulty in walking, not elsewhere classified: Secondary | ICD-10-CM

## 2017-11-06 DIAGNOSIS — M6281 Muscle weakness (generalized): Secondary | ICD-10-CM | POA: Diagnosis not present

## 2017-11-06 NOTE — Therapy (Signed)
[place], [name], [address] Phone: [phone]   Fax:  [phone]  Physical Therapy Treatment  Patient Details  Name: Amanda Short MRN: [id number] Date of Birth: [birth year] Referring Provider: Dr. Sandria Manly   Encounter Date: 11/06/2017  PT End of Session - 11/06/17 1347    Visit Number  15    Date for PT Re-Evaluation  11/03/17    PT Start Time  1306    PT Stop Time  1345    PT Time Calculation (min)  39 min    Activity Tolerance  Patient tolerated treatment well;Patient limited by fatigue       Past Medical History:  Diagnosis Date  . Arthritis    "back, hands" (07/22/2015)  . Cancer of left breast ([place]) 2006   S/P lumpectomy  . Chronic diastolic CHF (congestive heart failure) ([place]) 07/10/2017  . Complication of anesthesia    "brief breathing problem at surgery center in ~ 2005 when I had gallbladder OR"  . Concussion 03/2013   due to fall  . Depression   . DVT (deep venous thrombosis) ([place]) 03/2013   LLE  . GERD (gastroesophageal reflux disease)   . Hyperlipidemia   . Hypertension   . Hypothyroidism   . OSA treated with BiPAP   . Pulmonary embolism ([place]) 03/2013  . Seizures ([place])   . Thyroid disease    Hypothyroid  . Type II diabetes mellitus ([place])     Past Surgical History:  Procedure Laterality Date  . ABDOMINAL HYSTERECTOMY  1970s  . BREAST BIOPSY Left 2006  . BREAST LUMPECTOMY Left 2006  . BUNIONECTOMY WITH HAMMERTOE RECONSTRUCTION Left   . ESOPHAGOGASTRODUODENOSCOPY N/A 05/29/2017   Procedure: ESOPHAGOGASTRODUODENOSCOPY (EGD);  Surgeon: Irene Shipper, MD;  Location: [facility];  Service: Endoscopy;  Laterality: N/A;  . JOINT REPLACEMENT    . LAPAROSCOPIC CHOLECYSTECTOMY  ~ 2005  . TOTAL HIP ARTHROPLASTY Left 2010  . TOTAL KNEE ARTHROPLASTY Bilateral 2005-2006    There were no vitals filed for this visit.  Subjective Assessment - 11/06/17 1320    Subjective   Pt reports that she's doing well, and was very busy over the holiday weekend.     Pain Score  0-No pain                       [facility] Adult PT Treatment/Exercise - 11/06/17 0001      Ambulation/Gait   Ambulation/Gait  --    Ambulation/Gait Assistance  --    Gait Comments  gait around the building neg incline/declines and curbs. 4 rest breaks (5:19 min) pt c/o out of breath      High Level Balance   High Level Balance Activities  Negotiating over obstacles;Negotitating around obstacles;Figure 8 turns obstacle course 3x pt c/o of being out  of breath       Knee/Hip Exercises: Aerobic   Nustep  L5 5 min      Knee/Hip Exercises: Standing   Heel Raises  Both;2 sets;10 reps    Other Standing Knee Exercises  alternate tapping 20x2  LOB 3x                PT Short Term Goals - 09/13/17 1656      PT SHORT TERM GOAL #1   Title  independent with intial HEP    Status  Achieved        PT Long Term Goals - 10/23/17 1418  PT LONG TERM GOAL #1   Title  increase overall hip LE strength for functional gait and safety    Status  Partially Met      PT LONG TERM GOAL #2   Title  decrease TUG time to less than 15 seconds for functional gait and safety    Status  Achieved      PT LONG TERM GOAL #3   Title  increase BERG to 50/56 for safety and independence    Baseline  47/56    Status  Partially Met      PT LONG TERM GOAL #4   Title  gait without an assistive device for independence and QOL    Status  Achieved            Plan - 11/06/17 1351    Clinical Impression Statement  Pt ambulated around the building in two minutes less time than on 4/09, she took 3 rest breaks but c/o being out of breath. O2 sats after walk around the builidng was 90 and after 3x around obstacle course O2 sats dropped to 86. After deep breathing O2 sats climbed to 95. She was able to resume therapeutic exercises after rest. She had 3 LOB during alternating forward taps on 6in step.      Rehab Potential  Good    PT Frequency  2x / week    PT Duration  8 weeks    PT Treatment/Interventions  Electrical Stimulation;Balance training;Therapeutic exercise;Therapeutic activities;Stair training;Gait training;Patient/family education    PT Next Visit Plan  monitor O2 sats during activity; continue strengthening and functional balance exercises.    Consulted and Agree with Plan of Care  Patient       Patient will benefit from skilled therapeutic intervention in order to improve the following deficits and impairments:  Abnormal gait, Decreased activity tolerance, Decreased endurance, Decreased strength, Decreased balance, Difficulty walking  Visit Diagnosis: Muscle weakness (generalized)  Difficulty in walking, not elsewhere classified     Problem List Patient Active Problem List   Diagnosis Date Noted  . Hypomagnesemia 07/11/2017  . Nausea vomiting and diarrhea 07/10/2017  . Hypokalemia 07/10/2017  . Chronic diastolic CHF (congestive heart failure) ([place]) 07/10/2017  . Seizure ([place]) 07/10/2017  . OSA (obstructive sleep apnea) 07/10/2017  . Sepsis ([place]) 07/10/2017  . Seizure disorder ([place]) 07/05/2017  . Pulmonary HTN ([place]) 07/05/2017  . Anxiety state   . Physical debility 06/23/2017  . 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 History of acute respiratory failure   . Tracheostomy, acute management ([place])   . Aspiration pneumonia ([place])   . Encephalopathy   . Status epilepticus ([place])   . Benign essential HTN   . 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 History of pulmonary embolism   . Acute blood loss anemia   . Diverticulitis of colon   . Tachypnea   . Encounter for orogastric (OG) tube placement   . Encounter for central line placement   . Encounter for attention to tracheostomy ([place])   . Acute respiratory failure with hypoxia ([place])   . Acute encephalopathy 06/06/2017  . Diverticulitis of large intestine without perforation or abscess without bleeding    . Abdominal pain   . Non-intractable cyclical vomiting with nausea   . Abnormal CT of the abdomen   . Hypocalcemia 05/27/2017  . 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 Colitis 05/27/2017  . Diabetic foot infection ([place]) 07/21/2015  . Diabetes mellitus type 2, controlled ([place]) 07/21/2015  .  Cellulitis of foot, right 07/21/2015  . Cellulitis of right foot 07/21/2015  . Acute pulmonary embolism ([place]) 04/01/2013  . DVT (deep venous thrombosis) ([place]) 04/01/2013  . HX: breast cancer 04/01/2013  . Concussion 04/01/2013  . Diabetes ([place]) 04/01/2013  . Essential hypertension, benign 04/01/2013  . Hypothyroidism 04/01/2013  . OSA on CPAP 04/01/2013    Loyal Gambler 11/06/2017, 1:57 PM  [place], [name], [address] Phone: [phone]   Fax:  [phone]  Name: Amanda Short MRN: [id number] Date of Birth: 28-Feb-1941

## 2017-11-08 ENCOUNTER — Encounter: Payer: Self-pay | Admitting: Physical Therapy

## 2017-11-08 ENCOUNTER — Ambulatory Visit: Payer: Medicare Other | Admitting: Physical Therapy

## 2017-11-08 DIAGNOSIS — M6281 Muscle weakness (generalized): Secondary | ICD-10-CM

## 2017-11-08 DIAGNOSIS — R262 Difficulty in walking, not elsewhere classified: Secondary | ICD-10-CM

## 2017-11-08 DIAGNOSIS — Z9181 History of falling: Secondary | ICD-10-CM

## 2017-11-08 NOTE — Therapy (Signed)
[place], [name], [address] Phone: [phone]   Fax:  [phone]  Physical Therapy Evaluation  Patient Details  Name: Amanda Short MRN: [id number] Date of Birth: [birth year] Referring Provider: Dr. Sandria Manly   Encounter Date: 11/08/2017  PT End of Session - 11/08/17 1528    Visit Number  16    PT Start Time  7616    PT Stop Time  1530    PT Time Calculation (min)  39 min    Activity Tolerance  Patient tolerated treatment well;Patient limited by fatigue    Behavior During Therapy  [facility] for tasks assessed/performed       Past Medical History:  Diagnosis Date  . Arthritis    "back, hands" (07/22/2015)  . Cancer of left breast ([place]) 2006   S/P lumpectomy  . Chronic diastolic CHF (congestive heart failure) ([place]) 07/10/2017  . Complication of anesthesia    "brief breathing problem at surgery center in ~ 2005 when I had gallbladder OR"  . Concussion 03/2013   due to fall  . Depression   . DVT (deep venous thrombosis) ([place]) 03/2013   LLE  . GERD (gastroesophageal reflux disease)   . Hyperlipidemia   . Hypertension   . Hypothyroidism   . OSA treated with BiPAP   . Pulmonary embolism ([place]) 03/2013  . Seizures ([place])   . Thyroid disease    Hypothyroid  . Type II diabetes mellitus ([place])     Past Surgical History:  Procedure Laterality Date  . ABDOMINAL HYSTERECTOMY  1970s  . BREAST BIOPSY Left 2006  . BREAST LUMPECTOMY Left 2006  . BUNIONECTOMY WITH HAMMERTOE RECONSTRUCTION Left   . ESOPHAGOGASTRODUODENOSCOPY N/A 05/29/2017   Procedure: ESOPHAGOGASTRODUODENOSCOPY (EGD);  Surgeon: Irene Shipper, MD;  Location: [facility];  Service: Endoscopy;  Laterality: N/A;  . JOINT REPLACEMENT    . LAPAROSCOPIC CHOLECYSTECTOMY  ~ 2005  . TOTAL HIP ARTHROPLASTY Left 2010  . TOTAL KNEE ARTHROPLASTY Bilateral 2005-2006    There were no vitals filed for this visit.   Subjective Assessment -  11/08/17 1455    Subjective  pt reports shes doing fine and that she did her HEP yesterday.    Currently in Pain?  No/denies    Pain Score  0-No pain                    Objective measurements completed on examination: See above findings.      [place] Adult PT Treatment/Exercise - 11/08/17 0001      Knee/Hip Exercises: Aerobic   Recumbent Bike  L0x 6 min      Knee/Hip Exercises: Machines for Strengthening   Cybex Knee Extension  10lb 2x10    Cybex Knee Flexion  20lb 2x15    Cybex Leg Press  20# 1x15, 30lbs 1x15    Total Gym Leg Press  --      Knee/Hip Exercises: Standing   Stairs  2x 1 resbreak               PT Short Term Goals - 09/13/17 1656      PT SHORT TERM GOAL #1   Title  independent with intial HEP    Status  Achieved        PT Long Term Goals - 10/23/17 1418      PT LONG TERM GOAL #1   Title  increase overall hip LE strength for functional gait and safety  Status  Partially Met      PT LONG TERM GOAL #2   Title  decrease TUG time to less than 15 seconds for functional gait and safety    Status  Achieved      PT LONG TERM GOAL #3   Title  increase BERG to 50/56 for safety and independence    Baseline  47/56    Status  Partially Met      PT LONG TERM GOAL #4   Title  gait without an assistive device for independence and QOL    Status  Achieved             Plan - 11/08/17 1533    Clinical Impression Statement  Pt was able to climb the stairs twice today with only a rest break at the top of the stairs. She increased on 10lb with the leg press and needed vc for proper technique. Todays sessions focused on LE strength and endurance.      Rehab Potential  Good    PT Frequency  2x / week    PT Duration  8 weeks    PT Treatment/Interventions  Electrical Stimulation;Balance training;Therapeutic exercise;Therapeutic activities;Stair training;Gait training;Patient/family education    PT Next Visit Plan  Lower extremity  strengthening and balance       Patient will benefit from skilled therapeutic intervention in order to improve the following deficits and impairments:  Abnormal gait, Decreased activity tolerance, Decreased endurance, Decreased strength, Decreased balance, Difficulty walking  Visit Diagnosis: Muscle weakness (generalized)  Difficulty in walking, not elsewhere classified  Risk for falls     Problem List Patient Active Problem List   Diagnosis Date Noted  . Hypomagnesemia 07/11/2017  . Nausea vomiting and diarrhea 07/10/2017  . Hypokalemia 07/10/2017  . Chronic diastolic CHF (congestive heart failure) ([place]) 07/10/2017  . Seizure ([place]) 07/10/2017  . OSA (obstructive sleep apnea) 07/10/2017  . Sepsis ([place]) 07/10/2017  . Seizure disorder ([place]) 07/05/2017  . Pulmonary HTN ([place]) 07/05/2017  . Anxiety state   . Physical debility 06/23/2017  . 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 History of acute respiratory failure   . Tracheostomy, acute management ([place])   . Aspiration pneumonia ([place])   . Encephalopathy   . Status epilepticus ([place])   . Benign essential HTN   . 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 History of pulmonary embolism   . Acute blood loss anemia   . Diverticulitis of colon   . Tachypnea   . Encounter for orogastric (OG) tube placement   . Encounter for central line placement   . Encounter for attention to tracheostomy ([place])   . Acute respiratory failure with hypoxia ([place])   . Acute encephalopathy 06/06/2017  . Diverticulitis of large intestine without perforation or abscess without bleeding   . Abdominal pain   . Non-intractable cyclical vomiting with nausea   . Abnormal CT of the abdomen   . Hypocalcemia 05/27/2017  . 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 Colitis 05/27/2017  . Diabetic foot infection ([place]) 07/21/2015  . Diabetes mellitus type 2, controlled ([place]) 07/21/2015  . Cellulitis of foot, right 07/21/2015  . Cellulitis of right foot  07/21/2015  . Acute pulmonary embolism ([place]) 04/01/2013  . DVT (deep venous thrombosis) ([place]) 04/01/2013  . HX: breast cancer 04/01/2013  . Concussion 04/01/2013  . Diabetes ([place]) 04/01/2013  . Essential hypertension, benign 04/01/2013  . Hypothyroidism 04/01/2013  . OSA on CPAP 04/01/2013    Loyal Gambler 11/08/2017,  3:50 PM  [place], [name], [address] Phone: [phone]   Fax:  [phone]  Name: Caelie [place] MRN: [id number] Date of Birth: [birth year]

## 2017-11-12 NOTE — Addendum Note (Signed)
Addended by: [name] Boast on: 11/12/2017 01:01 PM   Modules accepted: Orders

## 2017-11-13 ENCOUNTER — Encounter: Payer: Self-pay | Admitting: Physical Therapy

## 2017-11-13 ENCOUNTER — Ambulatory Visit: Payer: Medicare Other | Admitting: Physical Therapy

## 2017-11-13 DIAGNOSIS — M6281 Muscle weakness (generalized): Secondary | ICD-10-CM

## 2017-11-13 DIAGNOSIS — R262 Difficulty in walking, not elsewhere classified: Secondary | ICD-10-CM

## 2017-11-13 NOTE — Therapy (Signed)
[facility] [place], [name], [address] Phone: [phone]   Fax:  [phone]  Physical Therapy Treatment  Patient Details  Name: Amanda Short MRN: [id number] Date of Birth: 1940-10-16 Referring Provider: Dr. Sandria Manly   Encounter Date: 11/13/2017  PT End of Session - 11/13/17 1449    Visit Number  17    Date for PT Re-Evaluation  12/07/17    PT Start Time  1400    PT Stop Time  1446    PT Time Calculation (min)  46 min    Activity Tolerance  Patient tolerated treatment well;Patient limited by fatigue    Behavior During Therapy  [facility] for tasks assessed/performed       Past Medical History:  Diagnosis Date  . Arthritis    "back, hands" (07/22/2015)  . Cancer of left breast ([place]) 2006   S/P lumpectomy  . Chronic diastolic CHF (congestive heart failure) ([place]) 07/10/2017  . Complication of anesthesia    "brief breathing problem at surgery center in ~ 2005 when I had gallbladder OR"  . Concussion 03/2013   due to fall  . Depression   . DVT (deep venous thrombosis) ([place]) 03/2013   LLE  . GERD (gastroesophageal reflux disease)   . Hyperlipidemia   . Hypertension   . Hypothyroidism   . OSA treated with BiPAP   . Pulmonary embolism ([place]) 03/2013  . Seizures ([place])   . Thyroid disease    Hypothyroid  . Type II diabetes mellitus ([place])     Past Surgical History:  Procedure Laterality Date  . ABDOMINAL HYSTERECTOMY  1970s  . BREAST BIOPSY Left 2006  . BREAST LUMPECTOMY Left 2006  . BUNIONECTOMY WITH HAMMERTOE RECONSTRUCTION Left   . ESOPHAGOGASTRODUODENOSCOPY N/A 05/29/2017   Procedure: ESOPHAGOGASTRODUODENOSCOPY (EGD);  Surgeon: Irene Shipper, MD;  Location: [facility];  Service: Endoscopy;  Laterality: N/A;  . JOINT REPLACEMENT    . LAPAROSCOPIC CHOLECYSTECTOMY  ~ 2005  . TOTAL HIP ARTHROPLASTY Left 2010  . TOTAL KNEE ARTHROPLASTY Bilateral 2005-2006    There were no vitals filed for this  visit.  Subjective Assessment - 11/13/17 1416    Subjective  pt reports that she is doing well today    Currently in Pain?  No/denies    Pain Score  0-No pain                       [facility] Adult PT Treatment/Exercise - 11/13/17 0001      High Level Balance   High Level Balance Activities  Other (comment);Tandem walking HHA on airex pad with alt lateral and posterior kick 1x10    High Level Balance Comments  4x8 ft tandem walk forward and backward with HHA      Knee/Hip Exercises: Aerobic   Nustep  L5x 28mn      Knee/Hip Exercises: Machines for Strengthening   Cybex Knee Extension  10lb 2x10    Cybex Knee Flexion  20lbs 3x10      Knee/Hip Exercises: Standing   Forward Step Up  2 sets;10 reps;Hand Hold: 2 1x20 alternating taps     Walking with Sports Cord  left side stepping 30lb, right side stepping 25lb 3x               PT Short Term Goals - 09/13/17 1656      PT SHORT TERM GOAL #1   Title  independent with intial HEP  Status  Achieved        PT Long Term Goals - 10/23/17 1418      PT LONG TERM GOAL #1   Title  increase overall hip LE strength for functional gait and safety    Status  Partially Met      PT LONG TERM GOAL #2   Title  decrease TUG time to less than 15 seconds for functional gait and safety    Status  Achieved      PT LONG TERM GOAL #3   Title  increase BERG to 50/56 for safety and independence    Baseline  47/56    Status  Partially Met      PT LONG TERM GOAL #4   Title  gait without an assistive device for independence and QOL    Status  Achieved            Plan - 11/13/17 1450    Clinical Impression Statement  Pt verbally reported that "today is a wobbly day". Pt showed improvment with 6in alternating taps with any LOB. She needed HHA during airex pad alternating kicks and for forward/backward tandem walking. She reports that she hasn't done any of her balance HEP.     Rehab Potential  Good    PT Frequency  2x /  week    PT Duration  8 weeks    PT Treatment/Interventions  Electrical Stimulation;Balance training;Therapeutic exercise;Therapeutic activities;Stair training;Gait training;Patient/family education    PT Next Visit Plan  continue LE strengthening and and functional balance exercises       Patient will benefit from skilled therapeutic intervention in order to improve the following deficits and impairments:  Abnormal gait, Decreased activity tolerance, Decreased endurance, Decreased strength, Decreased balance, Difficulty walking  Visit Diagnosis: Muscle weakness (generalized)  Difficulty in walking, not elsewhere classified     Problem List Patient Active Problem List   Diagnosis Date Noted  . Hypomagnesemia 07/11/2017  . Nausea vomiting and diarrhea 07/10/2017  . Hypokalemia 07/10/2017  . Chronic diastolic CHF (congestive heart failure) ([place]) 07/10/2017  . Seizure ([place]) 07/10/2017  . OSA (obstructive sleep apnea) 07/10/2017  . Sepsis ([place]) 07/10/2017  . Seizure disorder ([place]) 07/05/2017  . Pulmonary HTN ([place]) 07/05/2017  . Anxiety state   . Physical debility 06/23/2017  . 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 History of acute respiratory failure   . Tracheostomy, acute management ([place])   . Aspiration pneumonia ([place])   . Encephalopathy   . Status epilepticus ([place])   . Benign essential HTN   . 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 History of pulmonary embolism   . Acute blood loss anemia   . Diverticulitis of colon   . Tachypnea   . Encounter for orogastric (OG) tube placement   . Encounter for central line placement   . Encounter for attention to tracheostomy ([place])   . Acute respiratory failure with hypoxia ([place])   . Acute encephalopathy 06/06/2017  . Diverticulitis of large intestine without perforation or abscess without bleeding   . Abdominal pain   . Non-intractable cyclical vomiting with nausea   . Abnormal CT of the abdomen   . Hypocalcemia  05/27/2017  . 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 Colitis 05/27/2017  . Diabetic foot infection ([place]) 07/21/2015  . Diabetes mellitus type 2, controlled ([place]) 07/21/2015  . Cellulitis of foot, right 07/21/2015  . Cellulitis of right foot 07/21/2015  . Acute pulmonary embolism ([place]) 04/01/2013  . DVT (  deep venous thrombosis) ([place]) 04/01/2013  . HX: breast cancer 04/01/2013  . Concussion 04/01/2013  . Diabetes ([place]) 04/01/2013  . Essential hypertension, benign 04/01/2013  . Hypothyroidism 04/01/2013  . OSA on CPAP 04/01/2013    Loyal Gambler 11/13/2017, 2:55 PM  [place], [name], [address] Phone: [phone]   Fax:  [phone]  Name: Misti Towle MRN: [id number] Date of Birth: February 04, 1941

## 2017-11-16 ENCOUNTER — Ambulatory Visit: Payer: Medicare Other | Attending: Family Medicine | Admitting: Physical Therapy

## 2017-11-16 ENCOUNTER — Encounter: Payer: Self-pay | Admitting: Physical Therapy

## 2017-11-16 DIAGNOSIS — R262 Difficulty in walking, not elsewhere classified: Secondary | ICD-10-CM | POA: Insufficient documentation

## 2017-11-16 DIAGNOSIS — R5381 Other malaise: Secondary | ICD-10-CM | POA: Diagnosis present

## 2017-11-16 DIAGNOSIS — Z9181 History of falling: Secondary | ICD-10-CM | POA: Diagnosis present

## 2017-11-16 DIAGNOSIS — M6281 Muscle weakness (generalized): Secondary | ICD-10-CM | POA: Diagnosis not present

## 2017-11-16 NOTE — Therapy (Signed)
[place], [name], [address] Phone: [phone]   Fax:  [phone]  Physical Therapy Treatment  Patient Details  Name: Amanda Short MRN: [id number] Date of Birth: 05/28/1941 Referring Provider: Dr. Sandria Manly   Encounter Date: 11/16/2017  PT End of Session - 11/16/17 1144    Visit Number  18    Date for PT Re-Evaluation  12/07/17    PT Start Time  1100    PT Stop Time  1146    PT Time Calculation (min)  46 min    Activity Tolerance  Patient tolerated treatment well;Patient limited by fatigue    Behavior During Therapy  [facility] for tasks assessed/performed       Past Medical History:  Diagnosis Date  . Arthritis    "back, hands" (07/22/2015)  . Cancer of left breast ([place]) 2006   S/P lumpectomy  . Chronic diastolic CHF (congestive heart failure) ([place]) 07/10/2017  . Complication of anesthesia    "brief breathing problem at surgery center in ~ 2005 when I had gallbladder OR"  . Concussion 03/2013   due to fall  . Depression   . DVT (deep venous thrombosis) ([place]) 03/2013   LLE  . GERD (gastroesophageal reflux disease)   . Hyperlipidemia   . Hypertension   . Hypothyroidism   . OSA treated with BiPAP   . Pulmonary embolism ([place]) 03/2013  . Seizures ([place])   . Thyroid disease    Hypothyroid  . Type II diabetes mellitus ([place])     Past Surgical History:  Procedure Laterality Date  . ABDOMINAL HYSTERECTOMY  1970s  . BREAST BIOPSY Left 2006  . BREAST LUMPECTOMY Left 2006  . BUNIONECTOMY WITH HAMMERTOE RECONSTRUCTION Left   . ESOPHAGOGASTRODUODENOSCOPY N/A 05/29/2017   Procedure: ESOPHAGOGASTRODUODENOSCOPY (EGD);  Surgeon: Irene Shipper, MD;  Location: [facility];  Service: Endoscopy;  Laterality: N/A;  . JOINT REPLACEMENT    . LAPAROSCOPIC CHOLECYSTECTOMY  ~ 2005  . TOTAL HIP ARTHROPLASTY Left 2010  . TOTAL KNEE ARTHROPLASTY Bilateral 2005-2006    There were no vitals filed for this  visit.  Subjective Assessment - 11/16/17 1106    Subjective  "i'm doing well"    Currently in Pain?  No/denies    Pain Score  0-No pain                       [facility] Adult PT Treatment/Exercise - 11/16/17 0001      Ambulation/Gait   Gait Comments  gait around the building neg incline/declines and curbs. 7x rest breaks pt very fatigued during walk around building  O2 sats 92      Knee/Hip Exercises: Aerobic   Nustep  L0 x 5 min       Knee/Hip Exercises: Machines for Strengthening   Cybex Knee Extension  10lb 2x10    Cybex Knee Flexion  20lb 2x10 20lb 2x10 calf raises     Cybex Leg Press  20lb 2x10, 2x10 calf raises      Knee/Hip Exercises: Standing   Other Standing Knee Exercises  alternate tapping 20x1,     Other Standing Knee Exercises  1x10 step ups pt very fatigued LOB 3x               PT Short Term Goals - 09/13/17 1656      PT SHORT TERM GOAL #1   Title  independent with intial HEP    Status  Achieved  PT Long Term Goals - 10/23/17 1418      PT LONG TERM GOAL #1   Title  increase overall hip LE strength for functional gait and safety    Status  Partially Met      PT LONG TERM GOAL #2   Title  decrease TUG time to less than 15 seconds for functional gait and safety    Status  Achieved      PT LONG TERM GOAL #3   Title  increase BERG to 50/56 for safety and independence    Baseline  47/56    Status  Partially Met      PT LONG TERM GOAL #4   Title  gait without an assistive device for independence and QOL    Status  Achieved            Plan - 11/16/17 1151    Clinical Impression Statement  pt verbally reported during session today, that she did not use her oxygen with her CPAP machine last night. Today she was extremely fatigued during the walk around the building and step ups. Her oxygen sat levels dropped to 90 and returned to 97. She was able to resume therapy after resting. The remainder of the session focused on LE  strengthening on machines to reduce aerobic fatigue.     PT Frequency  2x / week    PT Duration  8 weeks    PT Treatment/Interventions  Electrical Stimulation;Balance training;Therapeutic exercise;Therapeutic activities;Stair training;Gait training;Patient/family education    PT Next Visit Plan  continue LE strengthening and and functional balance exercises       Patient will benefit from skilled therapeutic intervention in order to improve the following deficits and impairments:  Abnormal gait, Decreased activity tolerance, Decreased endurance, Decreased strength, Decreased balance, Difficulty walking  Visit Diagnosis: Muscle weakness (generalized)  Difficulty in walking, not elsewhere classified  Risk for falls     Problem List Patient Active Problem List   Diagnosis Date Noted  . Hypomagnesemia 07/11/2017  . Nausea vomiting and diarrhea 07/10/2017  . Hypokalemia 07/10/2017  . Chronic diastolic CHF (congestive heart failure) ([place]) 07/10/2017  . Seizure ([place]) 07/10/2017  . OSA (obstructive sleep apnea) 07/10/2017  . Sepsis ([place]) 07/10/2017  . Seizure disorder ([place]) 07/05/2017  . Pulmonary HTN ([place]) 07/05/2017  . Anxiety state   . Physical debility 06/23/2017  . 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 History of acute respiratory failure   . Tracheostomy, acute management ([place])   . Aspiration pneumonia ([place])   . Encephalopathy   . Status epilepticus ([place])   . Benign essential HTN   . 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 History of pulmonary embolism   . Acute blood loss anemia   . Diverticulitis of colon   . Tachypnea   . Encounter for orogastric (OG) tube placement   . Encounter for central line placement   . Encounter for attention to tracheostomy ([place])   . Acute respiratory failure with hypoxia ([place])   . Acute encephalopathy 06/06/2017  . Diverticulitis of large intestine without perforation or abscess without bleeding   . Abdominal pain   .  Non-intractable cyclical vomiting with nausea   . Abnormal CT of the abdomen   . Hypocalcemia 05/27/2017  . 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 Colitis 05/27/2017  . Diabetic foot infection ([place]) 07/21/2015  . Diabetes mellitus type 2, controlled ([place]) 07/21/2015  . Cellulitis of foot, right 07/21/2015  . Cellulitis of right foot  07/21/2015  . Acute pulmonary embolism ([place]) 04/01/2013  . DVT (deep venous thrombosis) ([place]) 04/01/2013  . HX: breast cancer 04/01/2013  . Concussion 04/01/2013  . Diabetes ([place]) 04/01/2013  . Essential hypertension, benign 04/01/2013  . Hypothyroidism 04/01/2013  . OSA on CPAP 04/01/2013    Loyal Gambler 11/16/2017, 11:55 AM  [place], [name], [address] Phone: [phone]   Fax:  [phone]  Name: Amanda Short MRN: [id number] Date of Birth: 11-17-1940

## 2017-11-20 ENCOUNTER — Ambulatory Visit: Payer: Medicare Other | Admitting: Physical Therapy

## 2017-11-20 DIAGNOSIS — Z9181 History of falling: Secondary | ICD-10-CM

## 2017-11-20 DIAGNOSIS — M6281 Muscle weakness (generalized): Secondary | ICD-10-CM | POA: Diagnosis not present

## 2017-11-20 DIAGNOSIS — R5381 Other malaise: Secondary | ICD-10-CM

## 2017-11-20 DIAGNOSIS — R262 Difficulty in walking, not elsewhere classified: Secondary | ICD-10-CM

## 2017-11-20 NOTE — Therapy (Signed)
[place], [name], [address] Phone: [phone]   Fax:  [phone]  Physical Therapy Treatment  Patient Details  Name: Amanda Short MRN: [id number] Date of Birth: 1941-02-03 Referring Provider: Dr. Sandria Manly   Encounter Date: 11/20/2017  PT End of Session - 11/20/17 1348    Visit Number  19    Date for PT Re-Evaluation  12/07/17    PT Start Time  1315    PT Stop Time  1400    PT Time Calculation (min)  45 min       Past Medical History:  Diagnosis Date  . Arthritis    "back, hands" (07/22/2015)  . Cancer of left breast ([place]) 2006   S/P lumpectomy  . Chronic diastolic CHF (congestive heart failure) ([place]) 07/10/2017  . Complication of anesthesia    "brief breathing problem at surgery center in ~ 2005 when I had gallbladder OR"  . Concussion 03/2013   due to fall  . Depression   . DVT (deep venous thrombosis) ([place]) 03/2013   LLE  . GERD (gastroesophageal reflux disease)   . Hyperlipidemia   . Hypertension   . Hypothyroidism   . OSA treated with BiPAP   . Pulmonary embolism ([place]) 03/2013  . Seizures ([place])   . Thyroid disease    Hypothyroid  . Type II diabetes mellitus ([place])     Past Surgical History:  Procedure Laterality Date  . ABDOMINAL HYSTERECTOMY  1970s  . BREAST BIOPSY Left 2006  . BREAST LUMPECTOMY Left 2006  . BUNIONECTOMY WITH HAMMERTOE RECONSTRUCTION Left   . ESOPHAGOGASTRODUODENOSCOPY N/A 05/29/2017   Procedure: ESOPHAGOGASTRODUODENOSCOPY (EGD);  Surgeon: Irene Shipper, MD;  Location: [facility];  Service: Endoscopy;  Laterality: N/A;  . JOINT REPLACEMENT    . LAPAROSCOPIC CHOLECYSTECTOMY  ~ 2005  . TOTAL HIP ARTHROPLASTY Left 2010  . TOTAL KNEE ARTHROPLASTY Bilateral 2005-2006    There were no vitals filed for this visit.  Subjective Assessment - 11/20/17 1320    Subjective  CPAP machine still causing O2 to drop so worn out- waiting for adjustments    Currently in Pain?  No/denies                       [facility] Adult PT Treatment/Exercise - 11/20/17 0001      High Level Balance   High Level Balance Activities  Negotitating around obstacles;Negotiating over obstacles;Direction changes HHA 3# , dynamic surface      Knee/Hip Exercises: Aerobic   Nustep  L 5 6 min O 2 after       Knee/Hip Exercises: Machines for Strengthening   Cybex Knee Extension  10lb 2x10    Cybex Knee Flexion  20lb 2x10    Cybex Leg Press  30# 2 sets 10      Knee/Hip Exercises: Standing   Other Standing Knee Exercises  3# on airex HHA alt 20 marching and hip 3 way    Other Standing Knee Exercises  10 inch step touch fwd and SW 10 each 3#               PT Short Term Goals - 09/13/17 1656      PT SHORT TERM GOAL #1   Title  independent with intial HEP    Status  Achieved        PT Long Term Goals - 10/23/17 1418      PT LONG TERM GOAL #1  Title  increase overall hip LE strength for functional gait and safety    Status  Partially Met      PT LONG TERM GOAL #2   Title  decrease TUG time to less than 15 seconds for functional gait and safety    Status  Achieved      PT LONG TERM GOAL #3   Title  increase BERG to 50/56 for safety and independence    Baseline  47/56    Status  Partially Met      PT LONG TERM GOAL #4   Title  gait without an assistive device for independence and QOL    Status  Achieved            Plan - 11/20/17 1348    Clinical Impression Statement  difficulty on dynamic surface and with mvmt to catch ball, LOB. LE weakness noted when neg over obstacles- all requiring HHA and VCing    PT Treatment/Interventions  Electrical Stimulation;Balance training;Therapeutic exercise;Therapeutic activities;Stair training;Gait training;Patient/family education    PT Next Visit Plan  check BERG and LE strength for goals       Patient will benefit from skilled therapeutic intervention in order to improve the  following deficits and impairments:  Abnormal gait, Decreased activity tolerance, Decreased endurance, Decreased strength, Decreased balance, Difficulty walking  Visit Diagnosis: Muscle weakness (generalized)  Difficulty in walking, not elsewhere classified  Risk for falls  Physical debility     Problem List Patient Active Problem List   Diagnosis Date Noted  . Hypomagnesemia 07/11/2017  . Nausea vomiting and diarrhea 07/10/2017  . Hypokalemia 07/10/2017  . Chronic diastolic CHF (congestive heart failure) ([place]) 07/10/2017  . Seizure ([place]) 07/10/2017  . OSA (obstructive sleep apnea) 07/10/2017  . Sepsis ([place]) 07/10/2017  . Seizure disorder ([place]) 07/05/2017  . Pulmonary HTN ([place]) 07/05/2017  . Anxiety state   . Physical debility 06/23/2017  . 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 History of acute respiratory failure   . Tracheostomy, acute management ([place])   . Aspiration pneumonia ([place])   . Encephalopathy   . Status epilepticus ([place])   . Benign essential HTN   . 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 History of pulmonary embolism   . Acute blood loss anemia   . Diverticulitis of colon   . Tachypnea   . Encounter for orogastric (OG) tube placement   . Encounter for central line placement   . Encounter for attention to tracheostomy ([place])   . Acute respiratory failure with hypoxia ([place])   . Acute encephalopathy 06/06/2017  . Diverticulitis of large intestine without perforation or abscess without bleeding   . Abdominal pain   . Non-intractable cyclical vomiting with nausea   . Abnormal CT of the abdomen   . Hypocalcemia 05/27/2017  . 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 Colitis 05/27/2017  . Diabetic foot infection ([place]) 07/21/2015  . Diabetes mellitus type 2, controlled ([place]) 07/21/2015  . Cellulitis of foot, right 07/21/2015  . Cellulitis of right foot 07/21/2015  . Acute pulmonary embolism ([place]) 04/01/2013  . DVT (deep venous thrombosis) ([place])  04/01/2013  . HX: breast cancer 04/01/2013  . Concussion 04/01/2013  . Diabetes ([place]) 04/01/2013  . Essential hypertension, benign 04/01/2013  . Hypothyroidism 04/01/2013  . OSA on CPAP 04/01/2013    Aldeen Riga,[name] 11/20/2017, 1:50 PM  [place], [name], [address] Phone: [phone]   Fax:  [phone]  Name: Amanda Short MRN: [id number] Date of Birth: [birth year]

## 2017-11-22 ENCOUNTER — Ambulatory Visit: Payer: Medicare Other | Admitting: Physical Therapy

## 2017-11-22 DIAGNOSIS — M6281 Muscle weakness (generalized): Secondary | ICD-10-CM

## 2017-11-22 DIAGNOSIS — R262 Difficulty in walking, not elsewhere classified: Secondary | ICD-10-CM

## 2017-11-22 DIAGNOSIS — Z9181 History of falling: Secondary | ICD-10-CM

## 2017-11-22 DIAGNOSIS — R5381 Other malaise: Secondary | ICD-10-CM

## 2017-11-22 NOTE — Therapy (Signed)
[place], [name], [address] Phone: [phone]   Fax:  [phone]  Physical Therapy Treatment  Patient Details  Name: Amanda Short MRN: [id number] Date of Birth: 1941/01/05 Referring Provider: Dr. Sandria Manly   Encounter Date: 11/22/2017  PT End of Session - 11/22/17 1605    Visit Number  20    Date for PT Re-Evaluation  12/07/17    PT Start Time  1530    PT Stop Time  1615    PT Time Calculation (min)  45 min       Past Medical History:  Diagnosis Date  . Arthritis    "back, hands" (07/22/2015)  . Cancer of left breast ([facility]) 2006   S/P lumpectomy  . Chronic diastolic CHF (congestive heart failure) ([facility]) 07/10/2017  . Complication of anesthesia    "brief breathing problem at surgery center in ~ 2005 when I had gallbladder OR"  . Concussion 03/2013   due to fall  . Depression   . DVT (deep venous thrombosis) ([place]) 03/2013   LLE  . GERD (gastroesophageal reflux disease)   . Hyperlipidemia   . Hypertension   . Hypothyroidism   . OSA treated with BiPAP   . Pulmonary embolism ([facility]) 03/2013  . Seizures ([place])   . Thyroid disease    Hypothyroid  . Type II diabetes mellitus ([place])     Past Surgical History:  Procedure Laterality Date  . ABDOMINAL HYSTERECTOMY  1970s  . BREAST BIOPSY Left 2006  . BREAST LUMPECTOMY Left 2006  . BUNIONECTOMY WITH HAMMERTOE RECONSTRUCTION Left   . ESOPHAGOGASTRODUODENOSCOPY N/A 05/29/2017   Procedure: ESOPHAGOGASTRODUODENOSCOPY (EGD);  Surgeon: Irene Shipper, MD;  Location: [facility];  Service: Endoscopy;  Laterality: N/A;  . JOINT REPLACEMENT    . LAPAROSCOPIC CHOLECYSTECTOMY  ~ 2005  . TOTAL HIP ARTHROPLASTY Left 2010  . TOTAL KNEE ARTHROPLASTY Bilateral 2005-2006    There were no vitals filed for this visit.  Subjective Assessment - 11/22/17 1526    Subjective  still waiting to hear about o2 but did rest better last night    Currently in Pain?   No/denies                       [facility] Adult PT Treatment/Exercise - 11/22/17 0001      Berg Balance Test   Sit to Stand  Able to stand without using hands and stabilize independently    Standing Unsupported  Able to stand safely 2 minutes    Sitting with Back Unsupported but Feet Supported on Floor or Stool  Able to sit safely and securely 2 minutes    Stand to Sit  Sits safely with minimal use of hands    Transfers  Able to transfer safely, minor use of hands    Standing Unsupported with Eyes Closed  Able to stand 10 seconds safely    Standing Ubsupported with Feet Together  Able to place feet together independently and stand 1 minute safely    From Standing, Reach Forward with Outstretched Arm  Can reach forward >12 cm safely (5")    From Standing Position, Pick up Object from Floor  Able to pick up shoe safely and easily    From Standing Position, Turn to Look Behind Over each Shoulder  Looks behind from both sides and weight shifts well    Turn 360 Degrees  Able to turn 360 degrees safely in 4 seconds  or less    Standing Unsupported, Alternately Place Feet on Step/Stool  Able to stand independently and complete 8 steps >20 seconds    Standing Unsupported, One Foot in Front  Needs help to step but can hold 15 seconds    Standing on One Leg  Able to lift leg independently and hold equal to or more than 3 seconds    Total Score  49      Exercises   Exercises  Lumbar      Lumbar Exercises: Machines for Strengthening   Other Lumbar Machine Exercise  lat pull and seated row 20# 2 sets 10      Knee/Hip Exercises: Aerobic   Nustep  L 5 6 min 02 after 93      Knee/Hip Exercises: Standing   Walking with Sports Cord  30# fwd/back and SW min A d/t LOB    Other Standing Knee Exercises  red tband airex 15 reps hip flex,ext and abd      Knee/Hip Exercises: Seated   Sit to Sand  20 reps;without UE support 10 on airex and 10 on ground               PT Short Term  Goals - 09/13/17 1656      PT SHORT TERM GOAL #1   Title  independent with intial HEP    Status  Achieved        PT Long Term Goals - 11/22/17 1531      PT LONG TERM GOAL #1   Title  increase overall hip LE strength for functional gait and safety    Baseline  4/5 grossly tested in sitting    Status  Partially Met      PT LONG TERM GOAL #2   Title  decrease TUG time to less than 15 seconds for functional gait and safety    Status  Achieved      PT LONG TERM GOAL #3   Title  increase BERG to 50/56 for safety and independence    Baseline  49/50    Status  Partially Met      PT LONG TERM GOAL #4   Title  gait without an assistive device for independence and QOL    Status  Achieved            Plan - 11/22/17 1539    Clinical Impression Statement  BERG increase 2 pt 49/56 ( goal is 50) progressing with all goals. continue to work on strength and endurance    PT Treatment/Interventions  Lobbyist;Therapeutic exercise;Therapeutic activities;Stair training;Gait training;Patient/family education    PT Next Visit Plan  progress strength and func       Patient will benefit from skilled therapeutic intervention in order to improve the following deficits and impairments:  Abnormal gait, Decreased activity tolerance, Decreased endurance, Decreased strength, Decreased balance, Difficulty walking  Visit Diagnosis: Muscle weakness (generalized)  Difficulty in walking, not elsewhere classified  Risk for falls  Physical debility     Problem List Patient Active Problem List   Diagnosis Date Noted  . Hypomagnesemia 07/11/2017  . Nausea vomiting and diarrhea 07/10/2017  . Hypokalemia 07/10/2017  . Chronic diastolic CHF (congestive heart failure) ([place]) 07/10/2017  . Seizure ([place]) 07/10/2017  . OSA (obstructive sleep apnea) 07/10/2017  . Sepsis ([place]) 07/10/2017  . Seizure disorder ([facility]) 07/05/2017  . Pulmonary HTN ([facility]) 07/05/2017  . Anxiety  state   . Physical debility 06/23/2017  . 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  History of acute respiratory failure   . Tracheostomy, acute management ([place])   . Aspiration pneumonia ([facility])   . Encephalopathy   . Status epilepticus ([facility])   . Benign essential HTN   . 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 History of pulmonary embolism   . Acute blood loss anemia   . Diverticulitis of colon   . Tachypnea   . Encounter for orogastric (OG) tube placement   . Encounter for central line placement   . Encounter for attention to tracheostomy ([place])   . Acute respiratory failure with hypoxia ([place])   . Acute encephalopathy 06/06/2017  . Diverticulitis of large intestine without perforation or abscess without bleeding   . Abdominal pain   . Non-intractable cyclical vomiting with nausea   . Abnormal CT of the abdomen   . Hypocalcemia 05/27/2017  . 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 Colitis 05/27/2017  . Diabetic foot infection ([place]) 07/21/2015  . Diabetes mellitus type 2, controlled ([place]) 07/21/2015  . Cellulitis of foot, right 07/21/2015  . Cellulitis of right foot 07/21/2015  . Acute pulmonary embolism ([place]) 04/01/2013  . DVT (deep venous thrombosis) ([facility]) 04/01/2013  . HX: breast cancer 04/01/2013  . Concussion 04/01/2013  . Diabetes ([place]) 04/01/2013  . Essential hypertension, benign 04/01/2013  . Hypothyroidism 04/01/2013  . OSA on CPAP 04/01/2013    Charell Faulk,[name] 11/22/2017, 4:05 PM  [place], [name], [address] Phone: [phone]   Fax:  [phone]  Name: Amanda Short MRN: [id number] Date of Birth: 1941/07/11

## 2017-11-27 ENCOUNTER — Ambulatory Visit: Payer: Medicare Other | Admitting: Physical Therapy

## 2017-11-27 DIAGNOSIS — Z9181 History of falling: Secondary | ICD-10-CM

## 2017-11-27 DIAGNOSIS — R5381 Other malaise: Secondary | ICD-10-CM

## 2017-11-27 DIAGNOSIS — M6281 Muscle weakness (generalized): Secondary | ICD-10-CM | POA: Diagnosis not present

## 2017-11-27 DIAGNOSIS — R262 Difficulty in walking, not elsewhere classified: Secondary | ICD-10-CM

## 2017-11-27 NOTE — Therapy (Signed)
[place], [name], [address] Phone: [phone]   Fax:  [phone]  Physical Therapy Treatment  Patient Details  Name: Amanda Short MRN: [id number] Date of Birth: [birth year] Referring Provider: Dr. Sandria Manly   Encounter Date: 11/27/2017  PT End of Session - 11/27/17 1444    Visit Number  21    Date for PT Re-Evaluation  12/07/17    PT Start Time  6767    PT Stop Time  2094    PT Time Calculation (min)  47 min       Past Medical History:  Diagnosis Date  . Arthritis    "back, hands" (07/22/2015)  . Cancer of left breast ([facility]) 2006   S/P lumpectomy  . Chronic diastolic CHF (congestive heart failure) ([facility]) 07/10/2017  . Complication of anesthesia    "brief breathing problem at surgery center in ~ 2005 when I had gallbladder OR"  . Concussion 03/2013   due to fall  . Depression   . DVT (deep venous thrombosis) ([place]) 03/2013   LLE  . GERD (gastroesophageal reflux disease)   . Hyperlipidemia   . Hypertension   . Hypothyroidism   . OSA treated with BiPAP   . Pulmonary embolism ([facility]) 03/2013  . Seizures ([place])   . Thyroid disease    Hypothyroid  . Type II diabetes mellitus ([facility])     Past Surgical History:  Procedure Laterality Date  . ABDOMINAL HYSTERECTOMY  1970s  . BREAST BIOPSY Left 2006  . BREAST LUMPECTOMY Left 2006  . BUNIONECTOMY WITH HAMMERTOE RECONSTRUCTION Left   . ESOPHAGOGASTRODUODENOSCOPY N/A 05/29/2017   Procedure: ESOPHAGOGASTRODUODENOSCOPY (EGD);  Surgeon: Irene Shipper, MD;  Location: [facility];  Service: Endoscopy;  Laterality: N/A;  . JOINT REPLACEMENT    . LAPAROSCOPIC CHOLECYSTECTOMY  ~ 2005  . TOTAL HIP ARTHROPLASTY Left 2010  . TOTAL KNEE ARTHROPLASTY Bilateral 2005-2006    There were no vitals filed for this visit.  Subjective Assessment - 11/27/17 1409    Subjective  doing okay    Currently in Pain?  No/denies                        [facility] Adult PT Treatment/Exercise - 11/27/17 0001      Lumbar Exercises: Machines for Strengthening   Cybex Lumbar Extension  black tband 15 times ab pull 15    Other Lumbar Machine Exercise  lat pull and seated row 20# 2 sets 10      Knee/Hip Exercises: Aerobic   Nustep  L 5 6 min      Knee/Hip Exercises: Machines for Strengthening   Cybex Knee Extension  15# 3 sets 10    Cybex Knee Flexion  25# 3 sets 10    Cybex Leg Press  30# 2 sets 15               PT Short Term Goals - 09/13/17 1656      PT SHORT TERM GOAL #1   Title  independent with intial HEP    Status  Achieved        PT Long Term Goals - 11/22/17 1531      PT LONG TERM GOAL #1   Title  increase overall hip LE strength for functional gait and safety    Baseline  4/5 grossly tested in sitting    Status  Partially Met      PT LONG TERM GOAL #  2   Title  decrease TUG time to less than 15 seconds for functional gait and safety    Status  Achieved      PT LONG TERM GOAL #3   Title  increase BERG to 50/56 for safety and independence    Baseline  49/50    Status  Partially Met      PT LONG TERM GOAL #4   Title  gait without an assistive device for independence and QOL    Status  Achieved            Plan - 11/27/17 1445    Clinical Impression Statement  started educ with pt and spouse for independance gym prog- wts,safety and set up. tolerated increased wt with fatigue- esp LAQ needed cuing for full ROM    PT Treatment/Interventions  Electrical Stimulation;Balance training;Therapeutic exercise;Therapeutic activities;Stair training;Gait training;Patient/family education    PT Next Visit Plan  D/C       Patient will benefit from skilled therapeutic intervention in order to improve the following deficits and impairments:  Abnormal gait, Decreased activity tolerance, Decreased endurance, Decreased strength, Decreased balance, Difficulty walking  Visit  Diagnosis: Muscle weakness (generalized)  Difficulty in walking, not elsewhere classified  Risk for falls  Physical debility     Problem List Patient Active Problem List   Diagnosis Date Noted  . Hypomagnesemia 07/11/2017  . Nausea vomiting and diarrhea 07/10/2017  . Hypokalemia 07/10/2017  . Chronic diastolic CHF (congestive heart failure) ([place]) 07/10/2017  . Seizure ([place]) 07/10/2017  . OSA (obstructive sleep apnea) 07/10/2017  . Sepsis ([place]) 07/10/2017  . Seizure disorder ([place]) 07/05/2017  . Pulmonary HTN ([place]) 07/05/2017  . Anxiety state   . Physical debility 06/23/2017  . 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 History of acute respiratory failure   . Tracheostomy, acute management ([place])   . Aspiration pneumonia ([place])   . Encephalopathy   . Status epilepticus ([place])   . Benign essential HTN   . 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 History of pulmonary embolism   . Acute blood loss anemia   . Diverticulitis of colon   . Tachypnea   . Encounter for orogastric (OG) tube placement   . Encounter for central line placement   . Encounter for attention to tracheostomy ([place])   . Acute respiratory failure with hypoxia ([place])   . Acute encephalopathy 06/06/2017  . Diverticulitis of large intestine without perforation or abscess without bleeding   . Abdominal pain   . Non-intractable cyclical vomiting with nausea   . Abnormal CT of the abdomen   . Hypocalcemia 05/27/2017  . 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 Colitis 05/27/2017  . Diabetic foot infection ([place]) 07/21/2015  . Diabetes mellitus type 2, controlled ([place]) 07/21/2015  . Cellulitis of foot, right 07/21/2015  . Cellulitis of right foot 07/21/2015  . Acute pulmonary embolism ([place]) 04/01/2013  . DVT (deep venous thrombosis) ([place]) 04/01/2013  . HX: breast cancer 04/01/2013  . Concussion 04/01/2013  . Diabetes ([place]) 04/01/2013  . Essential hypertension, benign 04/01/2013  . Hypothyroidism 04/01/2013  .  OSA on CPAP 04/01/2013    Yacoub Diltz,[name] 11/27/2017, 2:46 PM  [place], [name], [address] Phone: [phone]   Fax:  [phone]  Name: Amanda Short MRN: [id number] Date of Birth: 02/01/1941

## 2017-11-29 ENCOUNTER — Ambulatory Visit: Payer: Medicare Other | Admitting: Physical Therapy

## 2017-11-29 DIAGNOSIS — M6281 Muscle weakness (generalized): Secondary | ICD-10-CM | POA: Diagnosis not present

## 2017-11-29 DIAGNOSIS — Z9181 History of falling: Secondary | ICD-10-CM

## 2017-11-29 DIAGNOSIS — R5381 Other malaise: Secondary | ICD-10-CM

## 2017-11-29 DIAGNOSIS — R262 Difficulty in walking, not elsewhere classified: Secondary | ICD-10-CM

## 2017-11-29 NOTE — Therapy (Signed)
[place], [name], [address] Phone: [phone]   Fax:  [phone]  Physical Therapy Treatment  Patient Details  Name: Amanda Short MRN: [id number] Date of Birth: Jul 29, 1940 Referring Provider: Dr. Sandria Manly   Encounter Date: 11/29/2017  PT End of Session - 11/29/17 1417    Visit Number  23    Date for PT Re-Evaluation  12/07/17    PT Start Time  1400    PT Stop Time  1450    PT Time Calculation (min)  50 min       Past Medical History:  Diagnosis Date  . Arthritis    "back, hands" (07/22/2015)  . Cancer of left breast ([facility]) 2006   S/P lumpectomy  . Chronic diastolic CHF (congestive heart failure) ([place] [facility]) 07/10/2017  . Complication of anesthesia    "brief breathing problem at surgery center in ~ 2005 when I had gallbladder OR"  . Concussion 03/2013   due to fall  . Depression   . DVT (deep venous thrombosis) ([place]) 03/2013   LLE  . GERD (gastroesophageal reflux disease)   . Hyperlipidemia   . Hypertension   . Hypothyroidism   . OSA treated with BiPAP   . Pulmonary embolism ([place]) 03/2013  . Seizures ([place])   . Thyroid disease    Hypothyroid  . Type II diabetes mellitus ([facility])     Past Surgical History:  Procedure Laterality Date  . ABDOMINAL HYSTERECTOMY  1970s  . BREAST BIOPSY Left 2006  . BREAST LUMPECTOMY Left 2006  . BUNIONECTOMY WITH HAMMERTOE RECONSTRUCTION Left   . ESOPHAGOGASTRODUODENOSCOPY N/A 05/29/2017   Procedure: ESOPHAGOGASTRODUODENOSCOPY (EGD);  Surgeon: Irene Shipper, MD;  Location: [facility];  Service: Endoscopy;  Laterality: N/A;  . JOINT REPLACEMENT    . LAPAROSCOPIC CHOLECYSTECTOMY  ~ 2005  . TOTAL HIP ARTHROPLASTY Left 2010  . TOTAL KNEE ARTHROPLASTY Bilateral 2005-2006    There were no vitals filed for this visit.  Subjective Assessment - 11/29/17 1400    Subjective  I feel good about D/C and doing independant gym                        [facility] Adult PT Treatment/Exercise - 11/29/17 0001      Lumbar Exercises: Aerobic   UBE (Upper Arm Bike)  2 fwd/2back L 3      Lumbar Exercises: Machines for Strengthening   Cybex Lumbar Extension  black tband 20 times black band flex 20    Other Lumbar Machine Exercise  lat pull and seated row 20# 2 sets 10      Knee/Hip Exercises: Aerobic   Nustep  L 5 6 min      Knee/Hip Exercises: Machines for Strengthening   Cybex Knee Extension  15# 3 sets 10    Cybex Knee Flexion  25# 3 sets 10    Cybex Leg Press  30# 2 sets 15             PT Education - 11/29/17 1416    Education provided  Yes    Education Details  independant HEP    Person(s) Educated  Spouse;Patient    Methods  Explanation;Demonstration    Comprehension  Verbalized understanding;Returned demonstration       PT Short Term Goals - 09/13/17 1656      PT SHORT TERM GOAL #1   Title  independent with intial HEP    Status  Achieved  PT Long Term Goals - 11/29/17 1417      PT LONG TERM GOAL #1   Title  increase overall hip LE strength for functional gait and safety    Status  Achieved      PT LONG TERM GOAL #2   Title  decrease TUG time to less than 15 seconds for functional gait and safety    Status  Achieved      PT LONG TERM GOAL #3   Title  increase BERG to 50/56 for safety and independence    Status  Achieved      PT LONG TERM GOAL #4   Title  gait without an assistive device for independence and QOL    Status  Achieved            Plan - 11/29/17 1417    Clinical Impression Statement  all goals met- pt to D/C to independant gym program    PT Treatment/Interventions  Electrical Stimulation;Balance training;Therapeutic exercise;Therapeutic activities;Stair training;Gait training;Patient/family education    PT Next Visit Plan  D/C       Patient will benefit from skilled therapeutic intervention in order to improve the following deficits and  impairments:  Abnormal gait, Decreased activity tolerance, Decreased endurance, Decreased strength, Decreased balance, Difficulty walking  Visit Diagnosis: Muscle weakness (generalized)  Difficulty in walking, not elsewhere classified  Risk for falls  Physical debility     Problem List Patient Active Problem List   Diagnosis Date Noted  . Hypomagnesemia 07/11/2017  . Nausea vomiting and diarrhea 07/10/2017  . Hypokalemia 07/10/2017  . Chronic diastolic CHF (congestive heart failure) ([place]) 07/10/2017  . Seizure ([place]) 07/10/2017  . OSA (obstructive sleep apnea) 07/10/2017  . Sepsis ([place]) 07/10/2017  . Seizure disorder ([place]) 07/05/2017  . Pulmonary HTN ([place]) 07/05/2017  . Anxiety state   . Physical debility 06/23/2017  . 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 History of acute respiratory failure   . Tracheostomy, acute management ([place])   . Aspiration pneumonia ([place])   . Encephalopathy   . Status epilepticus ([place])   . Benign essential HTN   . 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 History of pulmonary embolism   . Acute blood loss anemia   . Diverticulitis of colon   . Tachypnea   . Encounter for orogastric (OG) tube placement   . Encounter for central line placement   . Encounter for attention to tracheostomy ([place])   . Acute respiratory failure with hypoxia ([place])   . Acute encephalopathy 06/06/2017  . Diverticulitis of large intestine without perforation or abscess without bleeding   . Abdominal pain   . Non-intractable cyclical vomiting with nausea   . Abnormal CT of the abdomen   . Hypocalcemia 05/27/2017  . 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 Colitis 05/27/2017  . Diabetic foot infection ([place]) 07/21/2015  . Diabetes mellitus type 2, controlled ([place]) 07/21/2015  . Cellulitis of foot, right 07/21/2015  . Cellulitis of right foot 07/21/2015  . Acute pulmonary embolism ([place]) 04/01/2013  . DVT (deep venous thrombosis) ([place]) 04/01/2013  . HX: breast cancer  04/01/2013  . Concussion 04/01/2013  . Diabetes ([place]) 04/01/2013  . Essential hypertension, benign 04/01/2013  . Hypothyroidism 04/01/2013  . OSA on CPAP 04/01/2013  PHYSICAL THERAPY DISCHARGE SUMMARY   Plan: Patient agrees to discharge.  Patient goals were met. Patient is being discharged due to meeting the stated rehab goals.  ?????       Raiden Yearwood,[name] 11/29/2017, 2:18  PM  [place], [name], [address] Phone: [phone]   Fax:  [phone]  Name: Lorma Heater MRN: [id number] Date of Birth: 1940-10-26

## 2017-12-04 ENCOUNTER — Ambulatory Visit: Payer: Medicare Other | Admitting: Physical Therapy

## 2017-12-06 ENCOUNTER — Ambulatory Visit: Payer: Medicare Other | Admitting: Physical Therapy

## 2019-05-07 IMAGING — DX DG CHEST 1V PORT
1 series · 1 of 1 positions shown · non-contrast
Comparison: CT chest March 31, 2013

CLINICAL DATA: Hypotension with dizziness

EXAM:
PORTABLE CHEST 1 VIEW

[chest ap]
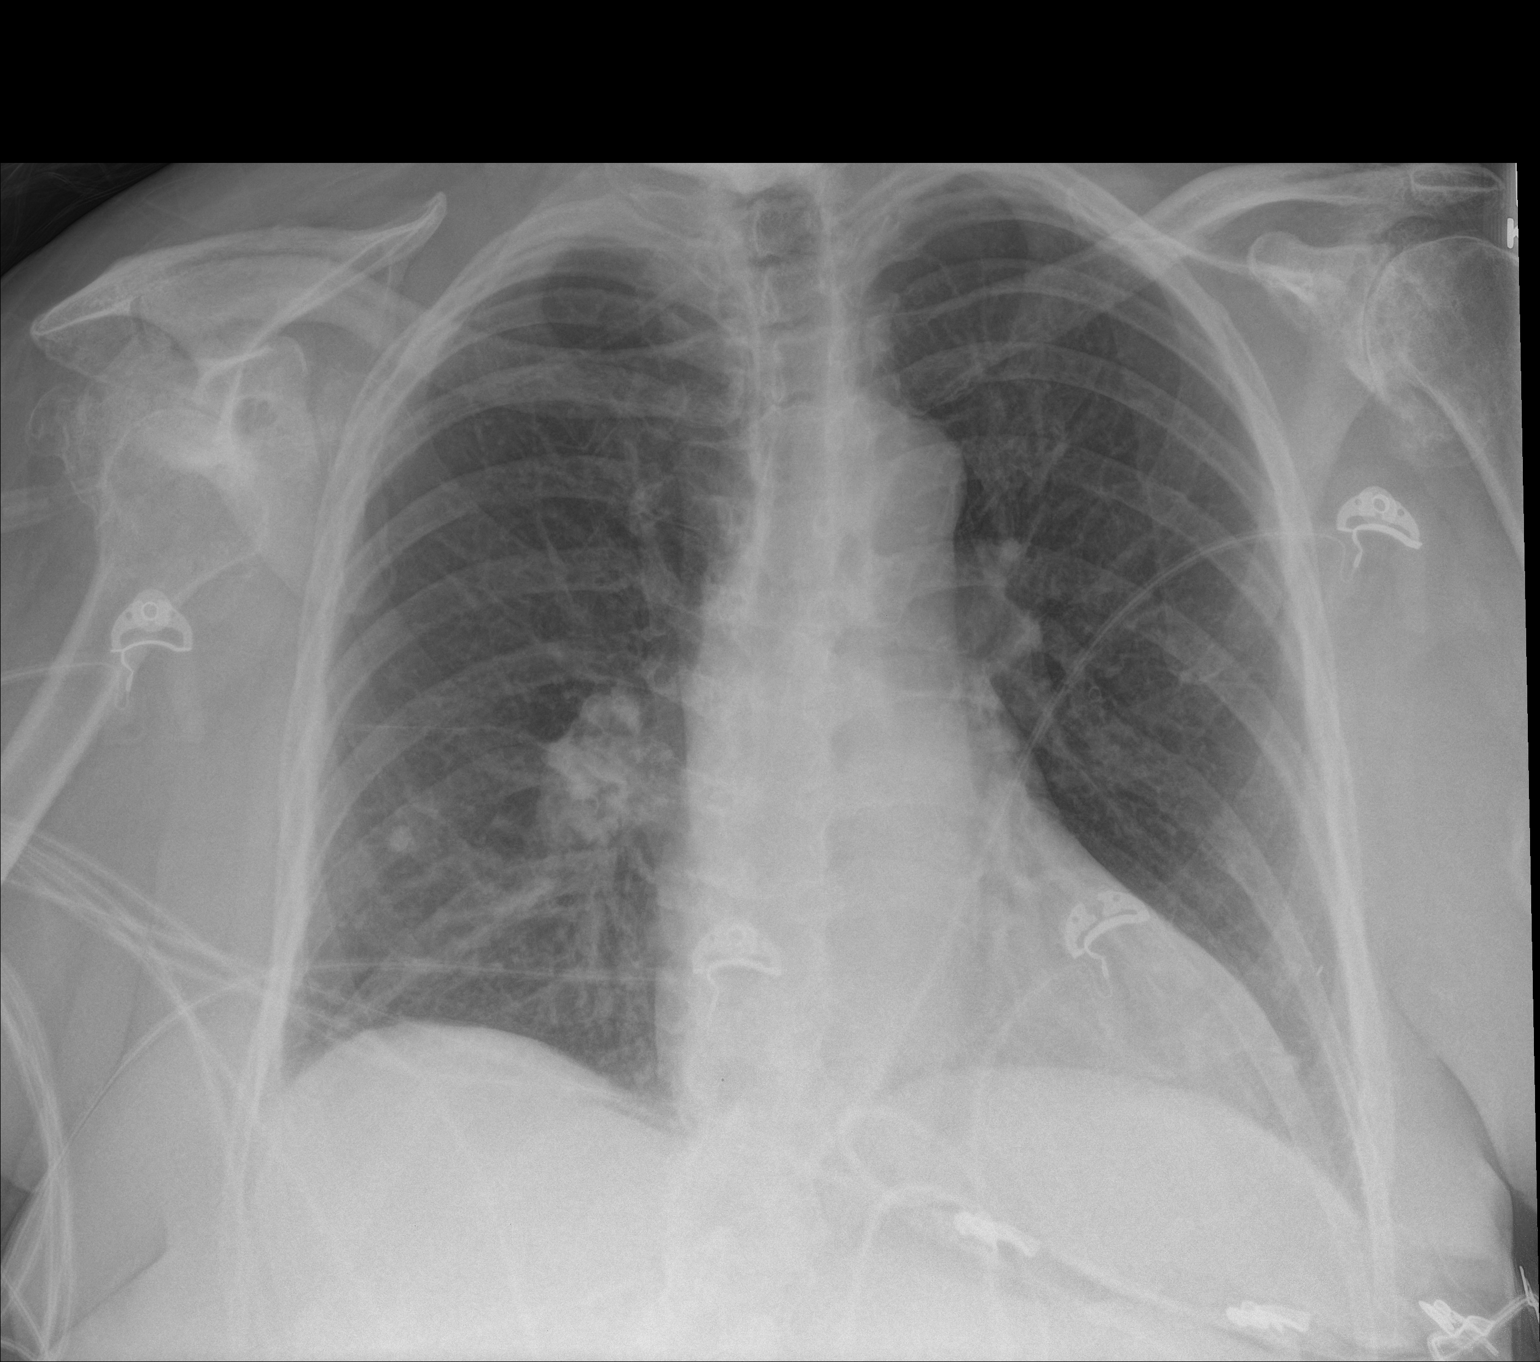

[1 of 1 positions shown; findings below may reference images not displayed]

FINDINGS: There is a calcified granuloma in the right lower lobe. There is no
edema or consolidation. Heart size and pulmonary vascularity are
within normal limits. No adenopathy. There is aortic
atherosclerosis. There is advanced arthropathy in both shoulders.
IMPRESSION: No edema or consolidation.  Calcified granuloma right lower lobe.

Heart size within normal limits.  There is aortic atherosclerosis.

Advanced arthropathy in both shoulders.

Aortic Atherosclerosis ([id number]).

## 2019-05-17 IMAGING — DX DG ABD PORTABLE 1V
1 series · 1 of 1 positions shown · non-contrast
Comparison: None.

CLINICAL DATA: Orogastric tube placement.

EXAM:
PORTABLE ABDOMEN - 1 VIEW

[abdomen]
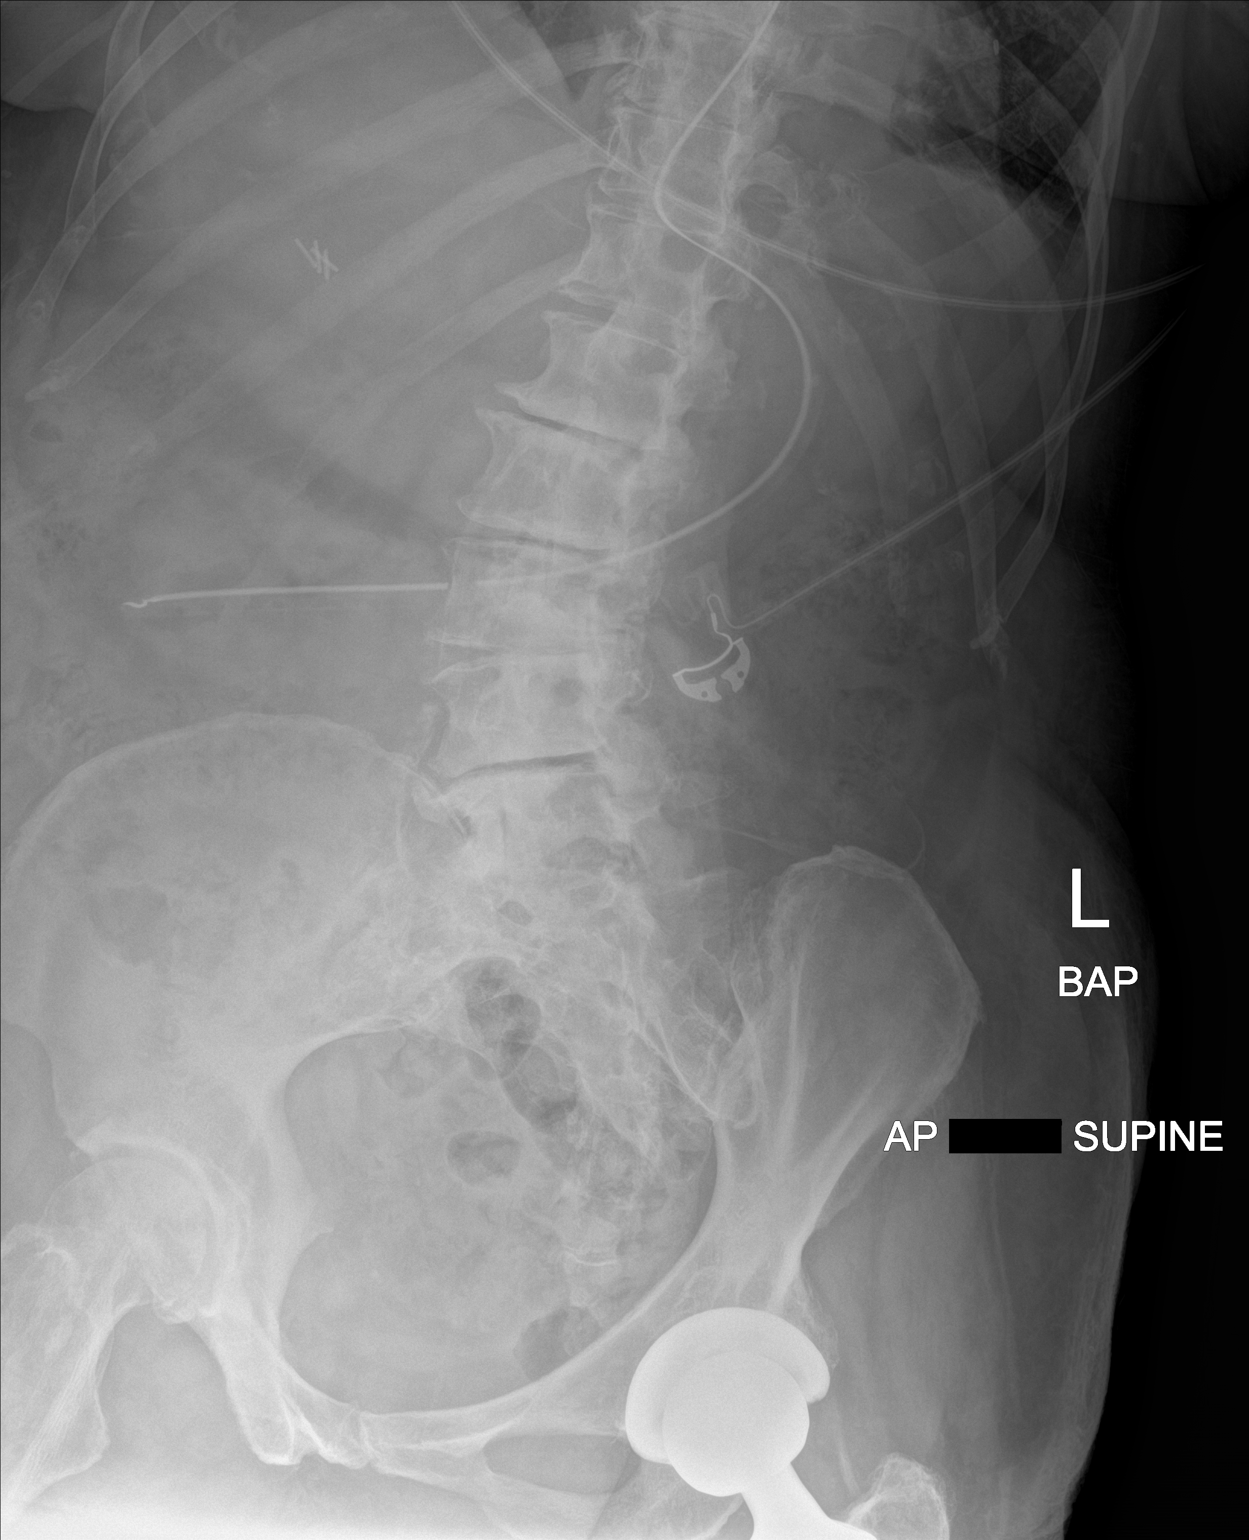

[1 of 1 positions shown; findings below may reference images not displayed]

FINDINGS: Patient is rotated to the right. Orogastric tube is seen with tip
overlying the distal stomach. No evidence of dilated bowel loops.
Surgical clips from prior cholecystectomy. Lumbar spine degenerative
changes and left hip prosthesis noted.
IMPRESSION: Orogastric tube tip overlies the distal stomach.

## 2019-05-23 IMAGING — DX DG CHEST 1V PORT
1 series · 1 of 1 positions shown · non-contrast
Comparison: 06/10/2017

CLINICAL DATA: Tracheostomy tube placement

EXAM:
PORTABLE CHEST 1 VIEW

[chest ap]
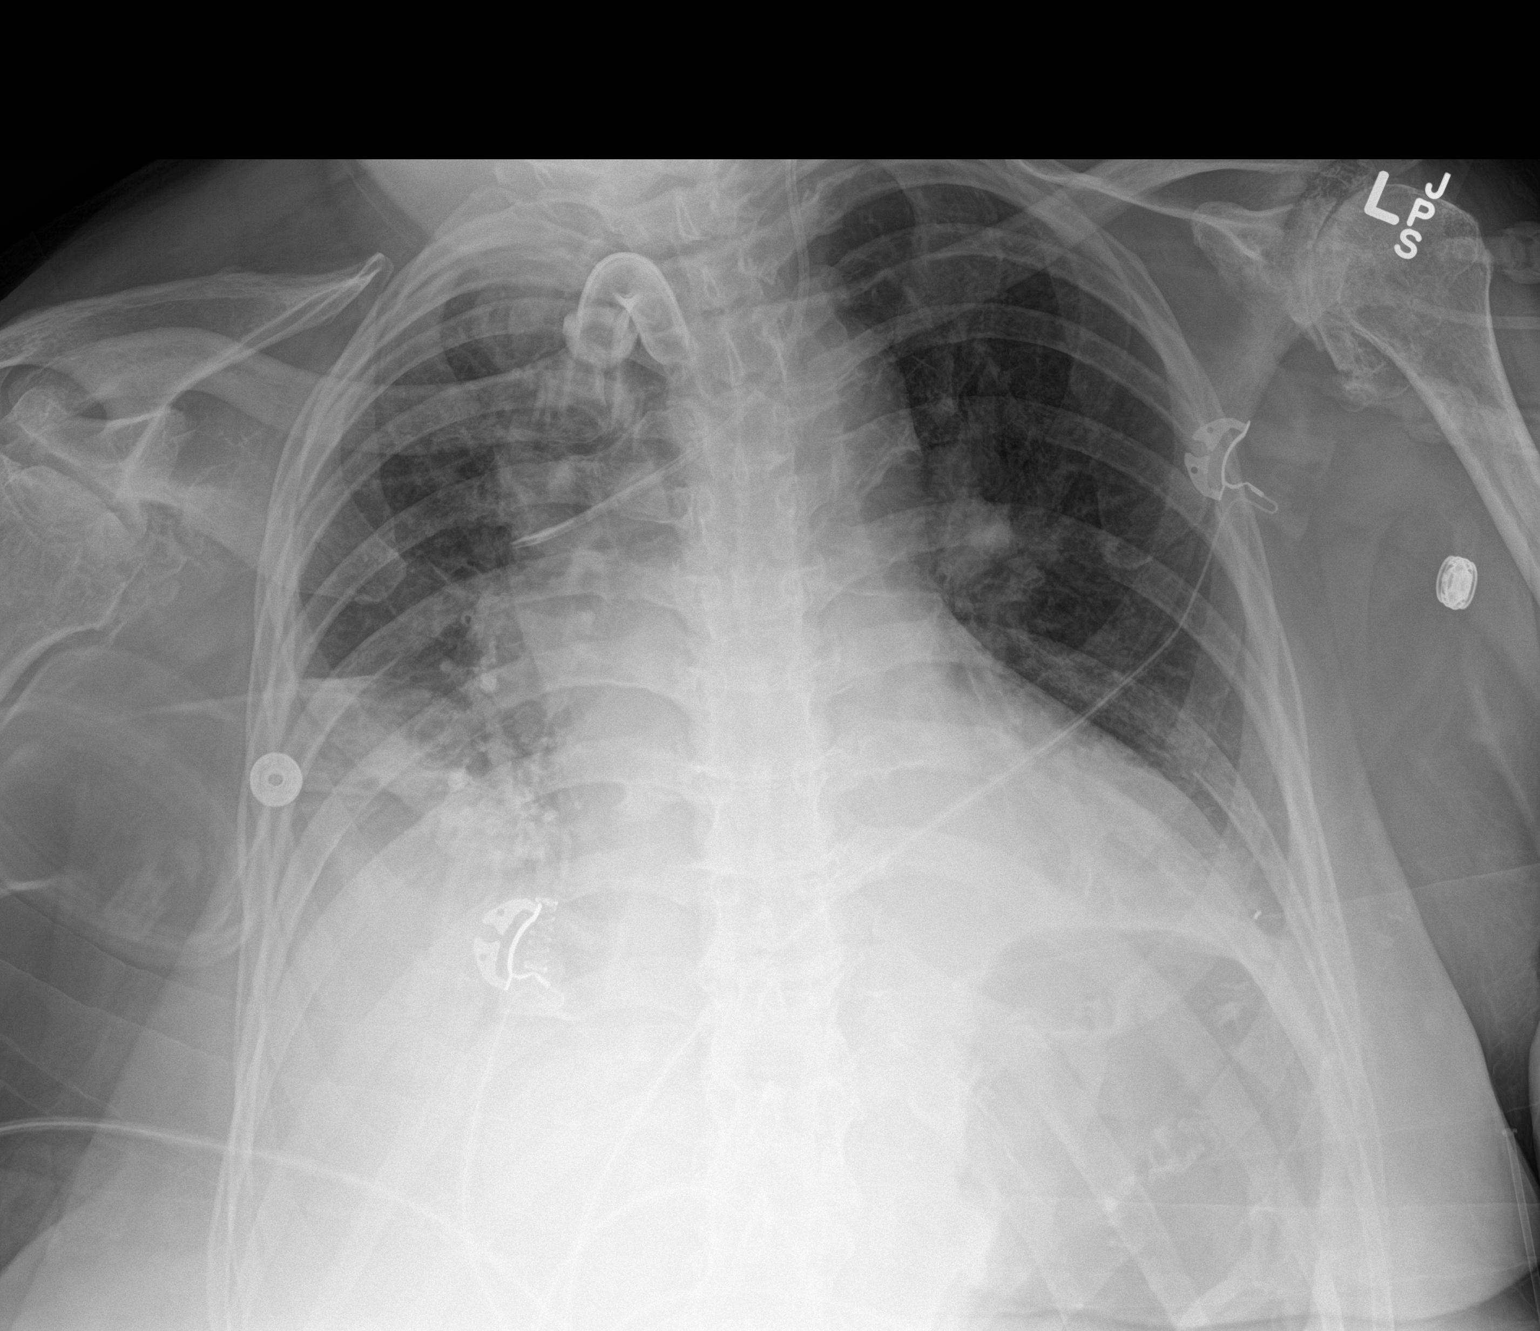

[1 of 1 positions shown; findings below may reference images not displayed]

FINDINGS: Tracheostomy tube with the tip 4.5 cm above the carina. Left jugular
central venous catheter at the confluence of the brachiocephalic
vein and SVC.

Small bilateral pleural effusions. Mild right basilar airspace
disease likely reflecting atelectasis. No pneumothorax. Stable
cardiomediastinal silhouette. No acute osseous abnormality. Severe
osteoarthritis of bilateral glenohumeral joints.
IMPRESSION: 1. Tracheostomy tube with the tip 4.5 cm above the carina.
2. Left jugular central venous catheter at the confluence of the
brachiocephalic vein and SVC.

## 2019-05-24 IMAGING — DX DG CHEST 1V PORT
1 series · 1 of 1 positions shown · non-contrast
Comparison: 06/12/2017

CLINICAL DATA: Pneumonia

EXAM:
PORTABLE CHEST 1 VIEW

[chest ap]
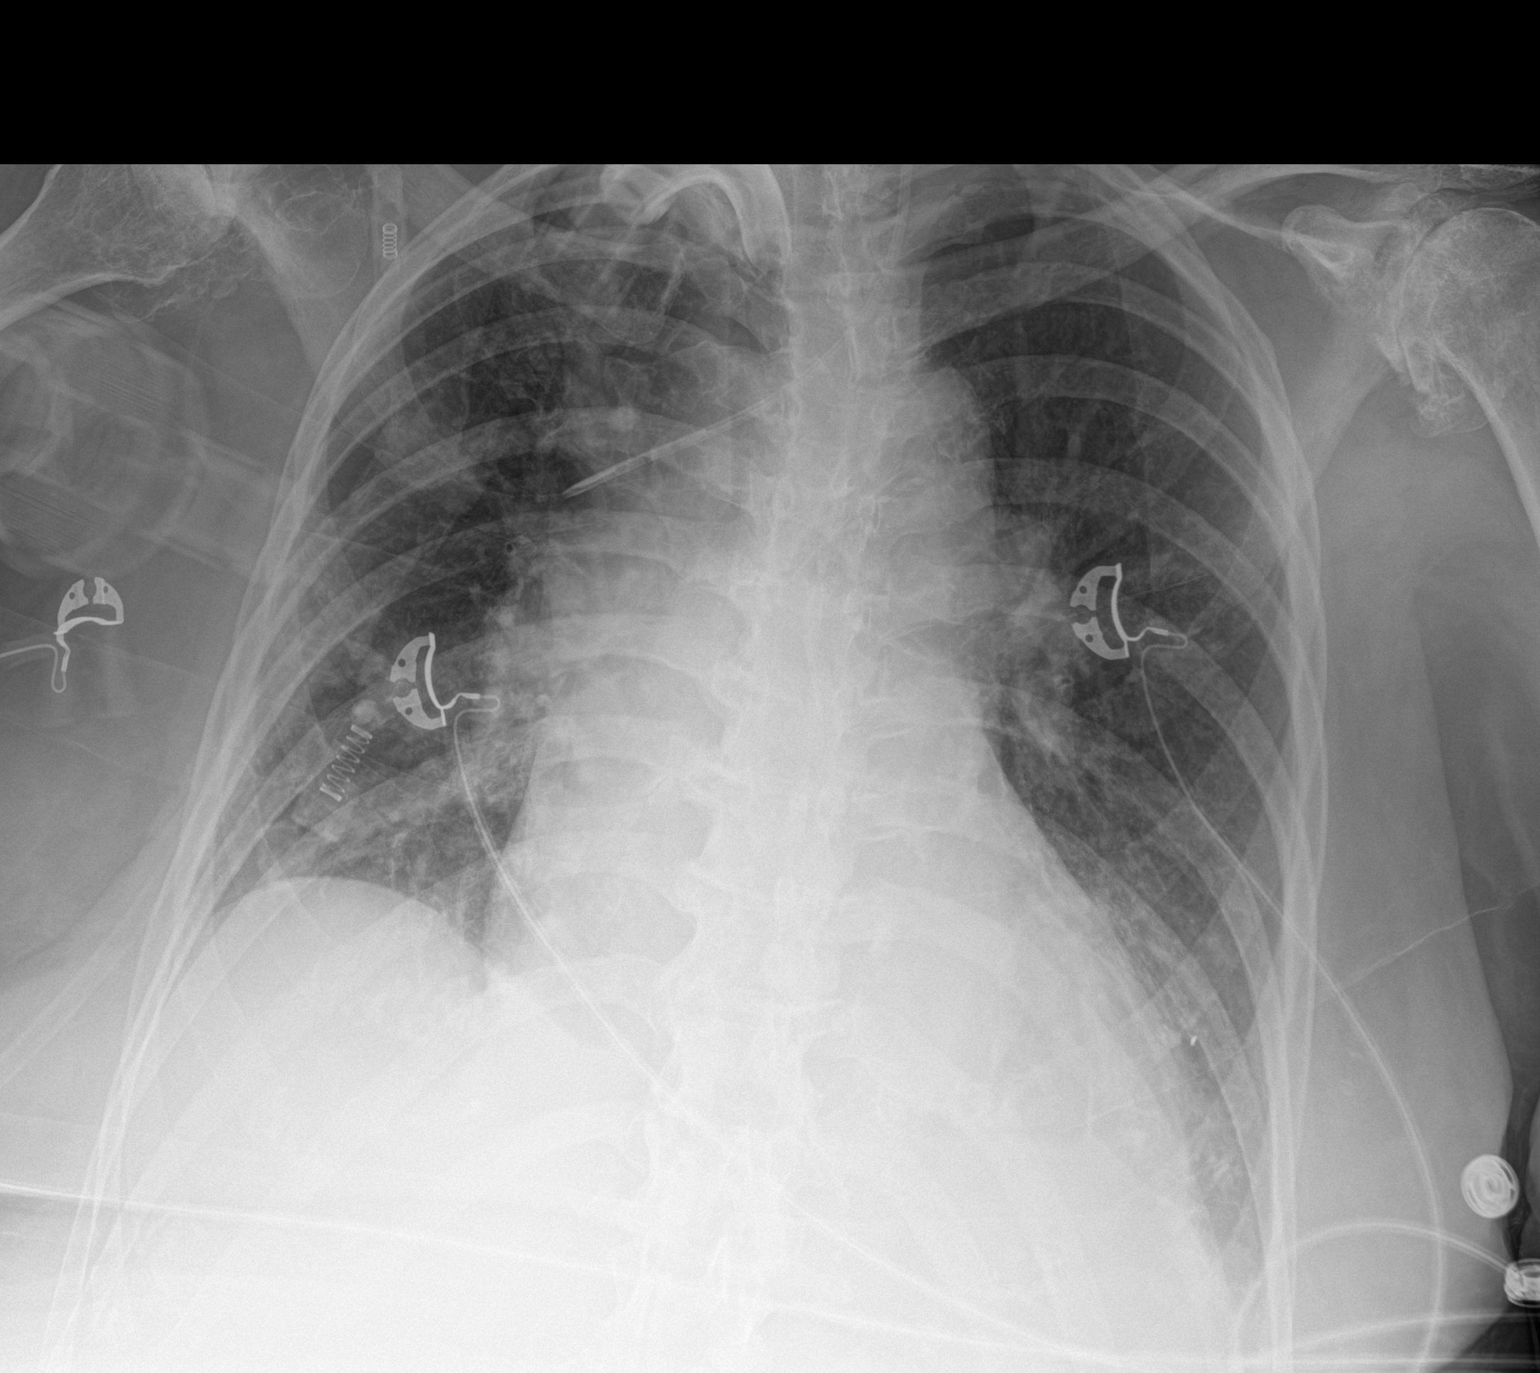

[1 of 1 positions shown; findings below may reference images not displayed]

FINDINGS: Tracheostomy and left central line remain in place, unchanged.
Cardiomegaly. Mild vascular congestion. Bibasilar opacities have
improved since prior study. No visible effusions or acute bony
abnormality.
IMPRESSION: Improving bibasilar atelectasis or infiltrates.

Cardiomegaly, vascular congestion.

## 2019-05-25 IMAGING — CR DG CHEST 1V PORT
1 series · 1 of 1 positions shown · non-contrast
Comparison: 06/13/2017

CLINICAL DATA: Pneumonia

EXAM:
PORTABLE CHEST 1 VIEW

[AP]
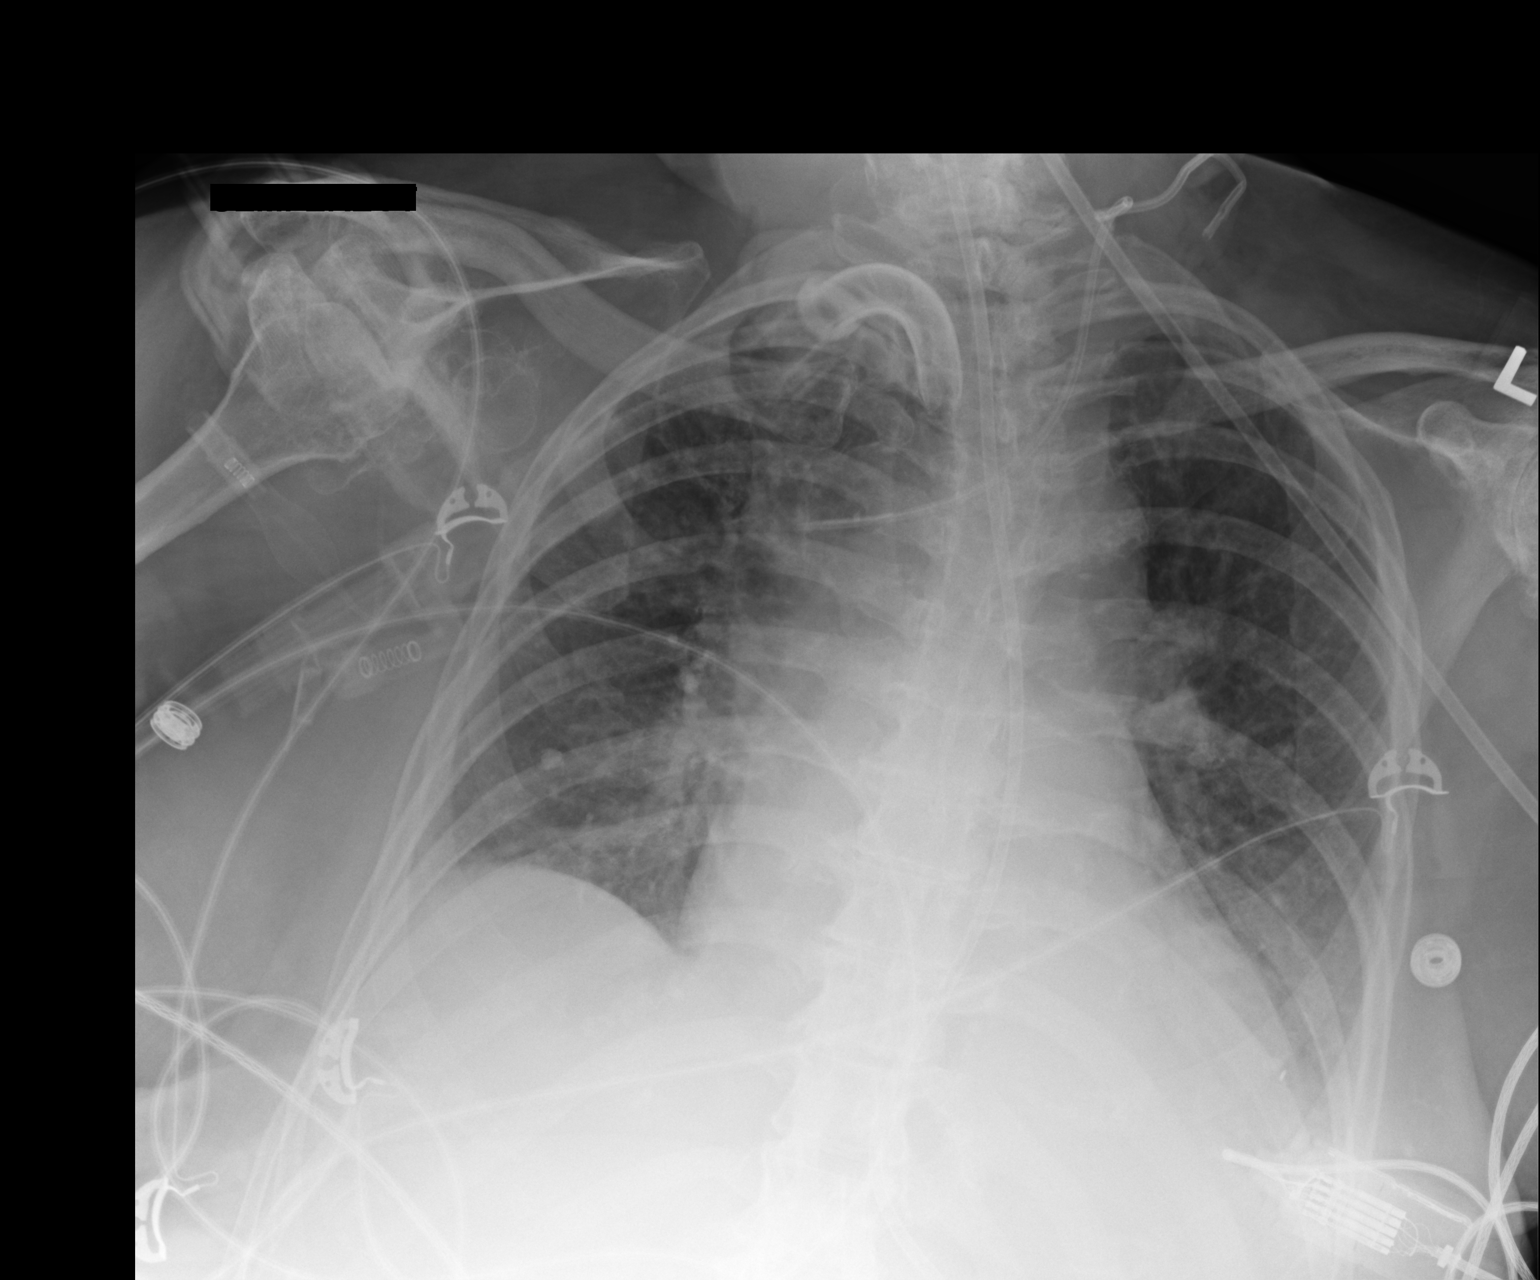

[1 of 1 positions shown; findings below may reference images not displayed]

FINDINGS: Tracheostomy remains in good position unchanged. Central venous
catheter tip in the SVC is unchanged. No pneumothorax. Feeding tube
has been placed in the interval and enters the stomach with the tip
not visualized.

Bibasilar airspace disease left greater than right is unchanged.
Small left effusion. Negative for edema.
IMPRESSION: No significant change from yesterday. Support lines remain in good
position.

Bibasilar airspace disease left greater than right with small left
effusion unchanged.

## 2019-06-16 ENCOUNTER — Ambulatory Visit: Payer: Medicare Other | Admitting: Podiatry

## 2019-06-16 ENCOUNTER — Other Ambulatory Visit: Payer: Self-pay

## 2019-06-16 ENCOUNTER — Other Ambulatory Visit: Payer: Self-pay | Admitting: Podiatry

## 2019-06-16 ENCOUNTER — Encounter: Payer: Self-pay | Admitting: Podiatry

## 2019-06-16 ENCOUNTER — Ambulatory Visit (INDEPENDENT_AMBULATORY_CARE_PROVIDER_SITE_OTHER): Payer: Medicare Other

## 2019-06-16 DIAGNOSIS — M21619 Bunion of unspecified foot: Secondary | ICD-10-CM

## 2019-06-16 DIAGNOSIS — M79674 Pain in right toe(s): Secondary | ICD-10-CM | POA: Diagnosis not present

## 2019-06-16 DIAGNOSIS — M79675 Pain in left toe(s): Secondary | ICD-10-CM

## 2019-06-16 DIAGNOSIS — M79671 Pain in right foot: Secondary | ICD-10-CM

## 2019-06-16 DIAGNOSIS — M79672 Pain in left foot: Secondary | ICD-10-CM

## 2019-06-16 DIAGNOSIS — B351 Tinea unguium: Secondary | ICD-10-CM

## 2019-06-16 NOTE — Patient Instructions (Signed)
Bunion  A bunion is a bump on the base of the big toe that forms when the bones of the big toe joint move out of position. Bunions may be small at first, but they often get larger over time. They can make walking painful. What are the causes? A bunion may be caused by:  Wearing narrow or pointed shoes that force the big toe to press against the other toes.  Abnormal foot development that causes the foot to roll inward (pronate).  Changes in the foot that are caused by certain diseases, such as rheumatoid arthritis or polio.  A foot injury. What increases the risk? The following factors may make you more likely to develop this condition:  Wearing shoes that squeeze the toes together.  Having certain diseases, such as: ? Rheumatoid arthritis. ? Polio. ? Cerebral palsy.  Having family members who have bunions.  Being born with a foot deformity, such as flat feet or low arches.  Doing activities that put a lot of pressure on the feet, such as ballet dancing. What are the signs or symptoms? The main symptom of a bunion is a noticeable bump on the big toe. Other symptoms may include:  Pain.  Swelling around the big toe.  Redness and inflammation.  Thick or hardened skin on the big toe or between the toes.  Stiffness or loss of motion in the big toe.  Trouble with walking. How is this diagnosed? A bunion may be diagnosed based on your symptoms, medical history, and activities. You may have tests, such as:  X-rays. These allow your health care provider to check the position of the bones in your foot and look for damage to your joint. They also help your health care provider determine the severity of your bunion and the best way to treat it.  Joint aspiration. In this test, a sample of fluid is removed from the toe joint. This test may be done if you are in a lot of pain. It helps rule out diseases that cause painful swelling of the joints, such as arthritis. How is this  treated? Treatment depends on the severity of your symptoms. The goal of treatment is to relieve symptoms and prevent the bunion from getting worse. Your health care provider may recommend:  Wearing shoes that have a wide toe box.  Using bunion pads to cushion the affected area.  Taping your toes together to keep them in a normal position.  Placing a device inside your shoe (orthotics) to help reduce pressure on your toe joint.  Taking medicine to ease pain, inflammation, and swelling.  Applying heat or ice to the affected area.  Doing stretching exercises.  Surgery to remove scar tissue and move the toes back into their normal position. This treatment is rare. Follow these instructions at home: Managing pain, stiffness, and swelling   If directed, put ice on the painful area: ? Put ice in a plastic bag. ? Place a towel between your skin and the bag. ? Leave the ice on for 20 minutes, 2-3 times a day. Activity   If directed, apply heat to the affected area before you exercise. Use the heat source that your health care provider recommends, such as a moist heat pack or a heating pad. ? Place a towel between your skin and the heat source. ? Leave the heat on for 20-30 minutes. ? Remove the heat if your skin turns bright red. This is especially important if you are unable to feel pain,   heat, or cold. You may have a greater risk of getting burned.  Do exercises as told by your health care provider. General instructions  Support your toe joint with proper footwear, shoe padding, or taping as told by your health care provider.  Take over-the-counter and prescription medicines only as told by your health care provider.  Keep all follow-up visits as told by your health care provider. This is important. Contact a health care provider if your symptoms:  Get worse.  Do not improve in 2 weeks. Get help right away if you have:  Severe pain and trouble with walking. Summary  A  bunion is a bump on the base of the big toe that forms when the bones of the big toe joint move out of position.  Bunions can make walking painful.  Treatment depends on the severity of your symptoms.  Support your toe joint with proper footwear, shoe padding, or taping as told by your health care provider. This information is not intended to replace advice given to you by your health care provider. Make sure you discuss any questions you have with your health care provider. Document Released: 07/03/2005 Document Revised: 01/07/2018 Document Reviewed: 11/13/2017 Elsevier Patient Education  2020 [employer].  

## 2019-06-16 NOTE — Progress Notes (Signed)
Subjective:   Patient ID: Amanda Short, female   DOB: 78 y.o.   MRN: [id number]   HPI Patient presents stating that she had her bunion fixed left about 5 years ago and the right one bothers her but she would not like to have surgery if possible and also states that her nails become sore and make it hard for her to walk too much and they become ingrown in the corners   Review of Systems  All other systems reviewed and are negative.       Objective:  Physical Exam Vitals signs and nursing note reviewed.  Constitutional:      Appearance: She is well-developed.  Pulmonary:     Effort: Pulmonary effort is normal.  Musculoskeletal: Normal range of motion.  Skin:    General: Skin is warm.  Neurological:     Mental Status: She is alert.     Neurovascular status was found to be intact muscle strength was adequate patient found to have incurvated nailbeds on several nails of both feet.  Patient has a structural bunion deformity right moderately severe with the hallux pressing against the second toe and has a fused left first metatarsal phalangeal joint.  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  Debridement of nailbeds accomplished today with no iatrogenic bleeding and this will be done on an as-needed basis and she will be seen back for routine care and ultimately may require ingrown toenail correction  X-ray left indicate plate screw across the first metatarsal phalangeal joint and on the right significant structural bunion deformity with hallux pressed against the second toe

## 2019-07-25 ENCOUNTER — Encounter: Payer: Self-pay | Admitting: Internal Medicine

## 2019-08-27 ENCOUNTER — Other Ambulatory Visit: Payer: Self-pay

## 2019-08-27 ENCOUNTER — Ambulatory Visit: Payer: Medicare PPO | Admitting: Internal Medicine

## 2019-08-27 ENCOUNTER — Encounter: Payer: Self-pay | Admitting: Internal Medicine

## 2019-08-27 ENCOUNTER — Other Ambulatory Visit (INDEPENDENT_AMBULATORY_CARE_PROVIDER_SITE_OTHER): Payer: Medicare PPO

## 2019-08-27 VITALS — BP 118/74 | HR 64 | Temp 98.0°F | Ht 59.0 in | Wt 161.5 lb

## 2019-08-27 DIAGNOSIS — R197 Diarrhea, unspecified: Secondary | ICD-10-CM

## 2019-08-27 DIAGNOSIS — R194 Change in bowel habit: Secondary | ICD-10-CM

## 2019-08-27 DIAGNOSIS — R1030 Lower abdominal pain, unspecified: Secondary | ICD-10-CM | POA: Diagnosis not present

## 2019-08-27 DIAGNOSIS — R109 Unspecified abdominal pain: Secondary | ICD-10-CM

## 2019-08-27 LAB — BASIC METABOLIC PANEL
BUN: 28 mg/dL — ABNORMAL HIGH (ref 6–23)
CO2: 31 mEq/L (ref 19–32)
Calcium: 9.4 mg/dL (ref 8.4–10.5)
Chloride: 104 mEq/L (ref 96–112)
Creatinine, Ser: 1.31 mg/dL — ABNORMAL HIGH (ref 0.40–1.20)
GFR: 39.22 mL/min — ABNORMAL LOW (ref >60.00)
Glucose, Bld: 134 mg/dL — ABNORMAL HIGH (ref 70–99)
Potassium: 4.7 mEq/L (ref 3.5–5.1)
Sodium: 141 mEq/L (ref 135–145)

## 2019-08-27 NOTE — Progress Notes (Signed)
HISTORY OF PRESENT ILLNESS:  Amanda Short is a 79 y.o. female with multiple significant medical problems on chronic oral anticoagulation who is self-referred regarding a myriad of GI complaints.  She is accompanied by her husband.  The majority of her healthcare has been provided elsewhere except for local hospitalizations over 2 years ago.  Her chief complaint today is concerns over possible diverticulitis.  She tells me that 2 to 3 weeks ago she felt she might have diverticulitis.  She was having abdominal cramping and change in bowel habits.  Not necessarily diarrhea, she states.  She tells me that her doctor back home had previously recommended that she self medicate with Pepto-Bismol which she did for some time.  Things improved.  She began to develop less formed bowels, but not diarrhea.  She does have chronic intermittent minor rectal bleeding due to known hemorrhoids.  Previous GI care elsewhere.  She also complains of chronic dysphagia which has been worked up previously and felt to represent spasm.  I saw the patient as a hospital inpatient November 2018.  Because of an abnormal CT scan, abdominal pain, and nausea with vomiting she underwent upper endoscopy May 29, 2017.  Examination was essentially normal.  She denies weight loss, actually weight gain.  No nausea or vomiting.  Some bloating.  Asymmetric discomfort in her lower abdomen.  Mostly on the right.  Other GI complaints include chronic intermittent incontinence for which she wears protective undergarments and belching.  She has not been vaccinated for Covid, but is waiting  REVIEW OF SYSTEMS:  All non-GI ROS negative unless otherwise stated in the HPI except for sinus allergy, back pain, visual change, hearing problems, muscle cramps, shortness of breath, urinary leakage  Past Medical History:  Diagnosis Date  . Arthritis    "back, hands" (07/22/2015)  . Cancer of left breast ([place]) 2006   S/P lumpectomy  . Chronic diastolic  CHF (congestive heart failure) ([place]) 07/10/2017  . Complication of anesthesia    "brief breathing problem at surgery center in ~ 2005 when I had gallbladder OR"  . Concussion 03/2013   due to fall  . Depression   . Diverticulosis   . DVT (deep venous thrombosis) ([place]) 03/2013   LLE  . GERD (gastroesophageal reflux disease)   . Hyperlipidemia   . Hypertension   . Hypothyroidism   . OSA treated with BiPAP   . Pulmonary embolism ([place]) 03/2013  . Seizures ([place])   . Sleep apnea   . Thyroid disease    Hypothyroid  . Type II diabetes mellitus ([place])     Past Surgical History:  Procedure Laterality Date  . ABDOMINAL HYSTERECTOMY  1970s  . BREAST BIOPSY Left 2006  . BREAST LUMPECTOMY Left 2006  . BUNIONECTOMY WITH HAMMERTOE RECONSTRUCTION Left   . ESOPHAGOGASTRODUODENOSCOPY N/A 05/29/2017   Procedure: ESOPHAGOGASTRODUODENOSCOPY (EGD);  Surgeon: Irene Shipper, MD;  Location: [facility];  Service: Endoscopy;  Laterality: N/A;  . JOINT REPLACEMENT    . LAPAROSCOPIC CHOLECYSTECTOMY  ~ 2005  . TOTAL HIP ARTHROPLASTY Left 2010  . TOTAL KNEE ARTHROPLASTY Bilateral 2005-2006    Social History Orlando Thalmann  reports that she quit smoking about 26 years ago. Her smoking use included cigarettes. She has a 30.00 pack-year smoking history. She has never used smokeless tobacco. She reports that she does not drink alcohol or use drugs.  family history includes Breast cancer in her cousin and sister; Colon cancer in her maternal grandmother; Diabetes in her sister and another  family member; Hypertension in her mother; Kidney disease in her sister; Liver cancer in her mother; Uterine cancer in her mother.  Allergies  Allergen Reactions  . Allopurinol Nausea And Vomiting       PHYSICAL EXAMINATION: Vital signs: BP 118/74 (BP Location: Left Arm, Patient Position: Sitting, Cuff Size: Normal)   Pulse 64   Temp 98 F (36.7 C)   Ht _0  (1.499 m) Comment: height measured without shoes  Wt  161 lb 8 oz (73.3 kg)   BMI 32.62 kg/m   Constitutional: Pleasant and generally well-appearing, no acute distress Psychiatric: alert and oriented x3, cooperative Eyes: extraocular movements intact, anicteric, conjunctiva pink Mouth: oral pharynx moist, no lesions Neck: supple no lymphadenopathy Cardiovascular: heart regular rate and rhythm, no murmur Lungs: clear to auscultation bilaterally Abdomen: soft, obese with mild tenderness in the right lower quadrant, nondistended, no obvious ascites, no peritoneal signs, normal bowel sounds, no organomegaly.  Bruising from submucosal injections along the lower abdomen Rectal: Omitted Extremities: no clubbing or cyanosis.  Trace lower extremity edema bilaterally Skin: no lesions on visible extremities Neuro: No focal deficits.  Cranial nerves intact  ASSESSMENT:  1.  Nonspecific change in bowel habits as described. 2.  Intermittent lower abdominal pain 3.  History of diverticulitis 4.  Dysphagia secondary to motility issues 5.  Normal EGD 2018 6.  Healthcare currently scattered   PLAN:  #1.  CT scan of the abdomen pelvis to evaluate pain and change in bowel habits in a patient with history of diverticulitis 2..  Metamucil 2 tablespoons daily 3.  Increase probiotic align to twice daily for 2 weeks 4.  Chew food well 5.  We will contact the patient after the CT scan results known.  If negative, continue with above listed measures.  She knows to contact the office for questions or problems 6.  Recommend that she obtain a local primary care physician given her multiple medical problems, multiple medications, and plans to reside here in [place]  Total of 40 minutes was spent preparing to see the patient, reviewing outside tests, laboratories, and evaluations.  Also obtaining and reviewing separately obtained history, performing comprehensive physical exam, counseling the patient and her husband regarding her myriad of complaints and my  accompanying impressions and recommendations.  Also ordering tests and making therapeutic recommendations.  Finally, documenting information in the clinical health record

## 2019-08-27 NOTE — Patient Instructions (Signed)
Your provider has requested that you go to the basement level for lab work before leaving today. Press "B" on the elevator. The lab is located at the first door on the left as you exit the elevator.  Begin using 2 tablespoons of Metamucil daily.  Increase your Align to twice a day for 2 weeks.   You have been scheduled for a CT scan of the abdomen and pelvis at [facility] ([address]---this is in the same building as [employer]).   You are scheduled on 08/29/2019 at 8:00am. You should arrive 15 minutes prior to your appointment time for registration. Please follow the written instructions below on the day of your exam:  WARNING: IF YOU ARE ALLERGIC TO IODINE/X-RAY DYE, PLEASE NOTIFY RADIOLOGY IMMEDIATELY AT [phone]! YOU WILL BE GIVEN A 13 HOUR PREMEDICATION PREP.  1) Do not eat or drink anything after 4:00am(4 hours prior to your test) 2) You have been given 2 bottles of oral contrast to drink. The solution may taste better if refrigerated, but do NOT add ice or any other liquid to this solution. Shake well before drinking.    Drink 1 bottle of contrast @ 6:00am (2 hours prior to your exam)  Drink 1 bottle of contrast @ 7:00am (1 hour prior to your exam)  You may take any medications as prescribed with a small amount of water, if necessary. If you take any of the following medications: METFORMIN, GLUCOPHAGE, GLUCOVANCE, AVANDAMET, RIOMET, FORTAMET, [place] MET, JANUMET, GLUMETZA or METAGLIP, you MAY be asked to HOLD this medication 48 hours AFTER the exam.  The purpose of you drinking the oral contrast is to aid in the visualization of your intestinal tract. The contrast solution may cause some diarrhea. Depending on your individual set of symptoms, you may also receive an intravenous injection of x-ray contrast/dye. Plan on being at [facility] for 30 minutes or longer, depending on the type of exam you are having performed.  This test typically takes  30-45 minutes to complete.  If you have any questions regarding your exam or if you need to reschedule, you may call the CT department at [phone] between the hours of 8:00 am and 5:00 pm, Monday-Friday.  ________________________________________________________________________

## 2019-08-28 ENCOUNTER — Other Ambulatory Visit: Payer: Self-pay

## 2019-08-28 DIAGNOSIS — R197 Diarrhea, unspecified: Secondary | ICD-10-CM

## 2019-08-28 DIAGNOSIS — Z0000 Encounter for general adult medical examination without abnormal findings: Secondary | ICD-10-CM

## 2019-08-29 ENCOUNTER — Ambulatory Visit (HOSPITAL_COMMUNITY): Payer: Medicare PPO

## 2019-08-31 ENCOUNTER — Ambulatory Visit: Payer: Medicare PPO | Attending: Internal Medicine

## 2019-08-31 DIAGNOSIS — Z23 Encounter for immunization: Secondary | ICD-10-CM | POA: Insufficient documentation

## 2019-08-31 NOTE — Progress Notes (Signed)
   Covid-19 Vaccination Clinic  Name:  Amanda Short    MRN: [id number] DOB: 03-02-1941  08/31/2019  [name] was observed post Covid-19 immunization for 15 minutes without incidence. She was provided with Vaccine Information Sheet and instruction to access the V-Safe system.   [name] was instructed to call 911 with any severe reactions post vaccine: Marland Kitchen Difficulty breathing  . Swelling of your face and throat  . A fast heartbeat  . A bad rash all over your body  . Dizziness and weakness    Immunizations Administered    Name Date Dose VIS Date Route   Pfizer COVID-19 Vaccine 08/31/2019  2:20 PM 0.3 mL 06/27/2019 Intramuscular   Manufacturer: [place]   Lot: [id number]   [place]: [id number]

## 2019-09-01 ENCOUNTER — Other Ambulatory Visit: Payer: Self-pay

## 2019-09-01 ENCOUNTER — Ambulatory Visit (HOSPITAL_COMMUNITY)
Admission: RE | Admit: 2019-09-01 | Discharge: 2019-09-01 | Disposition: A | Discharge status: Home / Self Care | Payer: Medicare PPO | Source: Ambulatory Visit | Attending: Internal Medicine | Admitting: Internal Medicine

## 2019-09-01 DIAGNOSIS — R1030 Lower abdominal pain, unspecified: Secondary | ICD-10-CM | POA: Diagnosis not present

## 2019-09-01 MED ORDER — IOHEXOL 300 MG/ML  SOLN
100.0000 mL | Freq: Once | INTRAMUSCULAR | Status: AC | PRN
  Administered 2019-09-01: 16:00:00 75 mL via INTRAVENOUS

## 2019-09-01 MED ORDER — SODIUM CHLORIDE (PF) 0.9 % IJ SOLN
INTRAMUSCULAR | Status: AC
  Filled 2019-09-01: qty 50

## 2019-09-02 ENCOUNTER — Telehealth: Payer: Self-pay | Admitting: Internal Medicine

## 2019-09-02 NOTE — Telephone Encounter (Signed)
See result note.  

## 2019-09-05 ENCOUNTER — Other Ambulatory Visit (INDEPENDENT_AMBULATORY_CARE_PROVIDER_SITE_OTHER): Payer: Medicare PPO

## 2019-09-05 DIAGNOSIS — R197 Diarrhea, unspecified: Secondary | ICD-10-CM

## 2019-09-05 LAB — BASIC METABOLIC PANEL
BUN: 28 mg/dL — ABNORMAL HIGH (ref 6–23)
CO2: 30 mEq/L (ref 19–32)
Calcium: 9.2 mg/dL (ref 8.4–10.5)
Chloride: 102 mEq/L (ref 96–112)
Creatinine, Ser: 1.38 mg/dL — ABNORMAL HIGH (ref 0.40–1.20)
GFR: 36.93 mL/min — ABNORMAL LOW (ref >60.00)
Glucose, Bld: 165 mg/dL — ABNORMAL HIGH (ref 70–99)
Potassium: 4.6 mEq/L (ref 3.5–5.1)
Sodium: 140 mEq/L (ref 135–145)

## 2019-09-15 ENCOUNTER — Ambulatory Visit: Payer: Medicare Other | Admitting: Podiatry

## 2019-09-23 ENCOUNTER — Ambulatory Visit: Payer: Medicare PPO | Attending: Internal Medicine

## 2019-09-23 DIAGNOSIS — Z23 Encounter for immunization: Secondary | ICD-10-CM

## 2019-09-23 NOTE — Progress Notes (Signed)
   Covid-19 Vaccination Clinic  Name:  Amanda Short    MRN: [id number] DOB: 1941-01-14  09/23/2019  Amanda Short was observed post Covid-19 immunization for 15 minutes without incident. She was provided with Vaccine Information Sheet and instruction to access the V-Safe system.   Amanda Short was instructed to call 911 with any severe reactions post vaccine: Marland Kitchen Difficulty breathing  . Swelling of face and throat  . A fast heartbeat  . A bad rash all over body  . Dizziness and weakness   Immunizations Administered    Name Date Dose VIS Date Route   Pfizer COVID-19 Vaccine 09/23/2019  2:04 PM 0.3 mL 06/27/2019 Intramuscular   Manufacturer: [place]   Lot: [id number]   [place]: [id number]

## 2019-09-24 ENCOUNTER — Ambulatory Visit: Payer: Medicare PPO

## 2019-11-18 ENCOUNTER — Ambulatory Visit: Payer: Medicare PPO | Attending: Family Medicine | Admitting: Physical Therapy

## 2019-11-18 ENCOUNTER — Other Ambulatory Visit: Payer: Self-pay

## 2019-11-18 ENCOUNTER — Encounter: Payer: Self-pay | Admitting: Physical Therapy

## 2019-11-18 ENCOUNTER — Ambulatory Visit: Payer: Medicare PPO | Admitting: Physical Therapy

## 2019-11-18 DIAGNOSIS — Z9181 History of falling: Secondary | ICD-10-CM | POA: Insufficient documentation

## 2019-11-18 DIAGNOSIS — R2689 Other abnormalities of gait and mobility: Secondary | ICD-10-CM | POA: Diagnosis present

## 2019-11-18 DIAGNOSIS — R5381 Other malaise: Secondary | ICD-10-CM | POA: Insufficient documentation

## 2019-11-18 DIAGNOSIS — M62830 Muscle spasm of back: Secondary | ICD-10-CM | POA: Diagnosis present

## 2019-11-18 DIAGNOSIS — R262 Difficulty in walking, not elsewhere classified: Secondary | ICD-10-CM | POA: Insufficient documentation

## 2019-11-18 DIAGNOSIS — M6281 Muscle weakness (generalized): Secondary | ICD-10-CM | POA: Diagnosis present

## 2019-11-18 DIAGNOSIS — M545 Low back pain, unspecified: Secondary | ICD-10-CM

## 2019-11-18 NOTE — Therapy (Signed)
[facility] [place], [name], [address] Phone: [phone]   Fax:  [phone]  Physical Therapy Evaluation  Patient Details  Name: Amanda Short MRN: [id number] Date of Birth: February 11, 1941 Referring Provider (PT): Rennard   Encounter Date: 11/18/2019  PT End of Session - 11/18/19 1336    Visit Number  1    Date for PT Re-Evaluation  01/02/20    Authorization Type  [employer]    PT Start Time  1300    PT Stop Time  1337    PT Time Calculation (min)  37 min    Activity Tolerance  Patient tolerated treatment well    Behavior During Therapy  [facility] for tasks assessed/performed       Past Medical History:  Diagnosis Date  . Arthritis    "back, hands" (07/22/2015)  . Cancer of left breast ([place]) 2006   S/P lumpectomy  . Chronic diastolic CHF (congestive heart failure) ([place]) 07/10/2017  . Complication of anesthesia    "brief breathing problem at surgery center in ~ 2005 when I had gallbladder OR"  . Concussion 03/2013   due to fall  . Depression   . Diverticulosis   . DVT (deep venous thrombosis) ([place]) 03/2013   LLE  . GERD (gastroesophageal reflux disease)   . Hyperlipidemia   . Hypertension   . Hypothyroidism   . OSA treated with BiPAP   . Pulmonary embolism ([place]) 03/2013  . Seizures ([place])   . Sleep apnea   . Thyroid disease    Hypothyroid  . Type II diabetes mellitus ([place])     Past Surgical History:  Procedure Laterality Date  . ABDOMINAL HYSTERECTOMY  1970s  . BREAST BIOPSY Left 2006  . BREAST LUMPECTOMY Left 2006  . BUNIONECTOMY WITH HAMMERTOE RECONSTRUCTION Left   . ESOPHAGOGASTRODUODENOSCOPY N/A 05/29/2017   Procedure: ESOPHAGOGASTRODUODENOSCOPY (EGD);  Surgeon: Irene Shipper, MD;  Location: [facility];  Service: Endoscopy;  Laterality: N/A;  . JOINT REPLACEMENT    . LAPAROSCOPIC CHOLECYSTECTOMY  ~ 2005  . TOTAL HIP ARTHROPLASTY Left 2010  . TOTAL KNEE ARTHROPLASTY Bilateral 2005-2006     There were no vitals filed for this visit.   Subjective Assessment - 11/18/19 1300    Subjective  Patient reports that she has had some LBP over the years, reports that she is starting to notice that she cannot do as much, having difficulty doing housework, shopping.  Reports that in the past she has dx of DDD and stenosis.    Limitations  Standing;Lifting;Walking;House hold activities    How long can you stand comfortably?  10 minutes    How long can you walk comfortably?  15 minutes    Patient Stated Goals  have less pain, easier ADL's    Currently in Pain?  Yes    Pain Score  2     Pain Location  Back    Pain Orientation  Right;Left;Lower    Pain Descriptors / Indicators  Aching;Spasm;Tightness    Pain Type  Acute pain    Pain Radiating Towards  denies    Pain Onset  More than a month ago    Pain Frequency  Intermittent    Aggravating Factors   laundry, walking, shopping, pain up to 8/10    Pain Relieving Factors  lie down, rest after about an hour pain can get down to 0/10    Effect of Pain on Daily Activities  limits all ADL's, difficulty with  any walking, shopping         [facility] PT Assessment - 11/18/19 0001      Assessment   Medical Diagnosis  LBP    Referring Provider (PT)  Rennard    Onset Date/Surgical Date  10/19/19    Prior Therapy  2-3 years ago for weakness after electrolyte imbalance      Precautions   Precautions  None      Balance Screen   Has the patient fallen in the past 6 months  Yes    How many times?  1    Has the patient had a decrease in activity level because of a fear of falling?   No    Is the patient reluctant to leave their home because of a fear of falling?   No      Home Environment   Additional Comments  does housework and cooking, some shopping, some gardening      Prior Function   Level of Independence  Independent    Vocation  Retired    Leisure  2x/week 45 minutes       ROM / Strength   AROM / PROM / Strength  AROM;Strength       AROM   Overall AROM Comments  lumbar ROM decreased 50% for flexion, decreased 100% for extension and side bending      Strength   Overall Strength Comments  hips 4-/5      Flexibility   Soft Tissue Assessment /Muscle Length  yes    Hamstrings  very tight 50 degree SLR    Piriformis  tight iwth some pain      Palpation   Palpation comment  very tight and tender in the lumbar paraspinals with pain in the buttocks      Ambulation/Gait   Gait Comments  no device, tends to use a wide base of support and seems to have some ataxic movements of the LE's, she denies numbness or neuropathy, she tends to use furniture, walls, or husband  for balance      Standardized Balance Assessment   Standardized Balance Assessment  Berg Balance Test      Berg Balance Test   Sit to Stand  Able to stand without using hands and stabilize independently    Standing Unsupported  Able to stand safely 2 minutes    Sitting with Back Unsupported but Feet Supported on Floor or Stool  Able to sit safely and securely 2 minutes    Stand to Sit  Sits safely with minimal use of hands    Transfers  Able to transfer safely, definite need of hands    Standing Unsupported with Eyes Closed  Able to stand 10 seconds with supervision    Standing Unsupported with Feet Together  Able to place feet together independently and stand for 1 minute with supervision    From Standing, Reach Forward with Outstretched Arm  Can reach forward >12 cm safely (5")    From Standing Position, Pick up Object from Floor  Unable to pick up shoe, but reaches 2-5 cm (1-2") from shoe and balances independently    From Standing Position, Turn to Look Behind Over each Shoulder  Turn sideways only but maintains balance    Turn 360 Degrees  Able to turn 360 degrees safely but slowly    Standing Unsupported, Alternately Place Feet on Step/Stool  Able to complete >2 steps/needs minimal assist    Standing Unsupported, One Foot in Front  Needs help  to  step but can hold 15 seconds    Standing on One Leg  Unable to try or needs assist to prevent fall    Total Score  36                Objective measurements completed on examination: See above findings.              PT Education - 11/18/19 1332    Education Details  HEP for WMs flexion    Person(s) Educated  Patient    Methods  Explanation;Demonstration;Handout    Comprehension  Verbalized understanding       PT Short Term Goals - 11/18/19 1343      PT SHORT TERM GOAL #1   Title  independent with intial HEP    Time  2    Period  Weeks    Status  New        PT Long Term Goals - 11/18/19 1343      PT LONG TERM GOAL #1   Title  increase overall hip LE strength for functional gait and safety hips and ankles to 4+/5    Period  Weeks    Status  New      PT LONG TERM GOAL #2   Title  decrease TUG time to less than 14 seconds for functional gait and safety    Time  8    Period  Weeks    Status  New      PT LONG TERM GOAL #3   Title  increase BERG to 50/56 for safety and independence    Baseline  36/56    Time  8    Period  Weeks    Status  New      PT LONG TERM GOAL #4   Title  decrease LBP 50% with activities    Time  8    Period  Weeks    Status  New      PT LONG TERM GOAL #5   Title  increase lumbar ROM 25%    Time  8    Period  Weeks    Status  New             Plan - 11/18/19 1337    Clinical Impression Statement  Patient is referred for LBP and loss of balance while walking, she is active working out in gym 2x/week, she reports that over the past year she has noticed more and more diffiuclty with ADL's that involve standing.  She reports difficulty with laundry, cooking and shopping due to back pain up to 8/10, reports rest will ease the pain, she is very tight in the HS and the piriformis mms.  Her gait was concerning to me, wide BOS , seeming to have some ataxic movements, uses her husband for balance, denies neuropathy, her Merrilee Jansky  balance score was 36/56 putting her at a high risk for falls.    Personal Factors and Comorbidities  Comorbidity 3+    Comorbidities  CHF, DM, seizures, THA    Stability/Clinical Decision Making  Evolving/Moderate complexity    Rehab Potential  Good    PT Frequency  2x / week    PT Duration  6 weeks    PT Treatment/Interventions  ADLs/Self Care Home Management;Electrical Stimulation;Moist Heat;Traction;Gait training;Neuromuscular re-education;Balance training;Therapeutic exercise;Therapeutic activities;Functional mobility training;Stair training;Patient/family education;Manual techniques    PT Next Visit Plan  flexibility of LE's. core stability and work on Teacher, music and Agree  with Plan of Care  Patient       Patient will benefit from skilled therapeutic intervention in order to improve the following deficits and impairments:  Abnormal gait, Decreased range of motion, Difficulty walking, Increased muscle spasms, Decreased activity tolerance, Pain, Decreased balance, Impaired flexibility, Improper body mechanics, Postural dysfunction, Decreased strength, Decreased mobility  Visit Diagnosis: Acute bilateral low back pain without sciatica - Plan: PT plan of care cert/re-cert  Other abnormalities of gait and mobility - Plan: PT plan of care cert/re-cert  Muscle spasm of back - Plan: PT plan of care cert/re-cert     Problem List Patient Active Problem List   Diagnosis Date Noted  . Hypomagnesemia 07/11/2017  . Nausea vomiting and diarrhea 07/10/2017  . Hypokalemia 07/10/2017  . Chronic diastolic CHF (congestive heart failure) ([place]) 07/10/2017  . Seizure ([place]) 07/10/2017  . OSA (obstructive sleep apnea) 07/10/2017  . Sepsis ([place]) 07/10/2017  . Seizure disorder ([place]) 07/05/2017  . Pulmonary HTN ([place]) 07/05/2017  . Anxiety state   . Physical debility 06/23/2017  . 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 History of acute respiratory failure   . Tracheostomy, acute  management ([place])   . Aspiration pneumonia ([place])   . Encephalopathy   . Status epilepticus ([place])   . Benign essential HTN   . 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 History of pulmonary embolism   . Acute blood loss anemia   . Diverticulitis of colon   . Tachypnea   . Encounter for orogastric (OG) tube placement   . Encounter for central line placement   . Encounter for attention to tracheostomy ([place])   . Acute respiratory failure with hypoxia ([place])   . Acute encephalopathy 06/06/2017  . Diverticulitis of large intestine without perforation or abscess without bleeding   . Abdominal pain   . Non-intractable cyclical vomiting with nausea   . Abnormal CT of the abdomen   . Hypocalcemia 05/27/2017  . 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 Colitis 05/27/2017  . Diabetic foot infection ([place]) 07/21/2015  . Diabetes mellitus type 2, controlled ([place]) 07/21/2015  . Cellulitis of foot, right 07/21/2015  . Cellulitis of right foot 07/21/2015  . Acute pulmonary embolism ([place]) 04/01/2013  . DVT (deep venous thrombosis) ([place]) 04/01/2013  . HX: breast cancer 04/01/2013  . Concussion 04/01/2013  . Diabetes ([place]) 04/01/2013  . Essential hypertension, benign 04/01/2013  . Hypothyroidism 04/01/2013  . OSA on CPAP 04/01/2013    [name] Boast., PT 11/18/2019, 1:47 PM  [facility] [place], [name], [address] Phone: [phone]   Fax:  [phone]  Name: Amanda Short MRN: [id number] Date of Birth: August 05, 1940

## 2019-11-27 ENCOUNTER — Ambulatory Visit: Payer: Medicare PPO | Admitting: Physical Therapy

## 2019-11-27 ENCOUNTER — Encounter: Payer: Self-pay | Admitting: Physical Therapy

## 2019-11-27 ENCOUNTER — Other Ambulatory Visit: Payer: Self-pay

## 2019-11-27 DIAGNOSIS — M545 Low back pain, unspecified: Secondary | ICD-10-CM

## 2019-11-27 DIAGNOSIS — M6281 Muscle weakness (generalized): Secondary | ICD-10-CM

## 2019-11-27 DIAGNOSIS — M62830 Muscle spasm of back: Secondary | ICD-10-CM

## 2019-11-27 DIAGNOSIS — R2689 Other abnormalities of gait and mobility: Secondary | ICD-10-CM

## 2019-11-27 DIAGNOSIS — R262 Difficulty in walking, not elsewhere classified: Secondary | ICD-10-CM

## 2019-11-27 NOTE — Therapy (Signed)
[facility] [place], [name], [address] Phone: [phone]   Fax:  [phone]  Physical Therapy Treatment  Patient Details  Name: Amanda Short MRN: [id number] Date of Birth: 1941-07-05 Referring Provider (PT): Rennard   Encounter Date: 11/27/2019  PT End of Session - 11/27/19 1524    Visit Number  2    Number of Visits  12    Date for PT Re-Evaluation  01/02/20    Authorization Type  [employer]    PT Start Time  2880    PT Stop Time  1525    PT Time Calculation (min)  47 min    Activity Tolerance  Patient limited by fatigue    Behavior During Therapy  [facility] for tasks assessed/performed       Past Medical History:  Diagnosis Date  . Arthritis    "back, hands" (07/22/2015)  . Cancer of left breast ([place]) 2006   S/P lumpectomy  . Chronic diastolic CHF (congestive heart failure) ([place]) 07/10/2017  . Complication of anesthesia    "brief breathing problem at surgery center in ~ 2005 when I had gallbladder OR"  . Concussion 03/2013   due to fall  . Depression   . Diverticulosis   . DVT (deep venous thrombosis) ([place]) 03/2013   LLE  . GERD (gastroesophageal reflux disease)   . Hyperlipidemia   . Hypertension   . Hypothyroidism   . OSA treated with BiPAP   . Pulmonary embolism ([place]) 03/2013  . Seizures ([place])   . Sleep apnea   . Thyroid disease    Hypothyroid  . Type II diabetes mellitus ([place])     Past Surgical History:  Procedure Laterality Date  . ABDOMINAL HYSTERECTOMY  1970s  . BREAST BIOPSY Left 2006  . BREAST LUMPECTOMY Left 2006  . BUNIONECTOMY WITH HAMMERTOE RECONSTRUCTION Left   . ESOPHAGOGASTRODUODENOSCOPY N/A 05/29/2017   Procedure: ESOPHAGOGASTRODUODENOSCOPY (EGD);  Surgeon: Irene Shipper, MD;  Location: [facility];  Service: Endoscopy;  Laterality: N/A;  . JOINT REPLACEMENT    . LAPAROSCOPIC CHOLECYSTECTOMY  ~ 2005  . TOTAL HIP ARTHROPLASTY Left 2010  . TOTAL KNEE ARTHROPLASTY  Bilateral 2005-2006    There were no vitals filed for this visit.  Subjective Assessment - 11/27/19 1446    Subjective  Patient reports that she feels like the exercises help    Currently in Pain?  Yes    Pain Score  2     Pain Location  Back    Pain Orientation  Lower    Pain Descriptors / Indicators  Aching                        [facility] Adult PT Treatment/Exercise - 11/27/19 0001      Exercises   Exercises  Lumbar      Lumbar Exercises: Stretches   Passive Hamstring Stretch  Right;Left;4 reps;20 seconds    Piriformis Stretch  Right;Left;4 reps;20 seconds      Lumbar Exercises: Aerobic   Recumbent Bike  6 minutes, tried numerous times to turn it on at level 0 but she could not maintain speed    Other Aerobic Exercise  she became very SOB, O2 saturation was 79% when we stopped and the HR was 120, she rested and we practiced me teaching her pursed lip breathing  after 4 minutes the O2 saturation came back to 95% with HR below 100 bpm  Lumbar Exercises: Standing   Other Standing Lumbar Exercises  2.5# hip marching, abduction and extension 10x each leg    Other Standing Lumbar Exercises  tried some postureal exercises with back to wall but has very poor shoulder ROM and could not do.      Lumbar Exercises: Supine   Other Supine Lumbar Exercises  feet on ball K2C, trunk rotation, small bridges and isometric abs               PT Short Term Goals - 11/27/19 1528      PT SHORT TERM GOAL #1   Title  independent with intial HEP    Status  Partially Met        PT Long Term Goals - 11/18/19 1343      PT LONG TERM GOAL #1   Title  increase overall hip LE strength for functional gait and safety hips and ankles to 4+/5    Period  Weeks    Status  New      PT LONG TERM GOAL #2   Title  decrease TUG time to less than 14 seconds for functional gait and safety    Time  8    Period  Weeks    Status  New      PT LONG TERM GOAL #3   Title  increase  BERG to 50/56 for safety and independence    Baseline  36/56    Time  8    Period  Weeks    Status  New      PT LONG TERM GOAL #4   Title  decrease LBP 50% with activities    Time  8    Period  Weeks    Status  New      PT LONG TERM GOAL #5   Title  increase lumbar ROM 25%    Time  8    Period  Weeks    Status  New            Plan - 11/27/19 1526    Clinical Impression Statement  Patient really struggled with trying to stay at level 0 on the bike, she has to keep it above 45 reps /minute to keep it on, she could not do this longer than 25 seconds, she would have to slow down, I noticed that she was breathing hard, after 6 minutes I checked her O2, it was 79% and HR was 120 bpm, it took over 4 minutes to get back to 90% and the HR to drop below 100 bpm.  This was concerning to me as I had not seen this from her in the past when she was exercising on her own.    PT Next Visit Plan  flexibility of LE's. core stability and work on balance, endurance    Consulted and Agree with Plan of Care  Patient       Patient will benefit from skilled therapeutic intervention in order to improve the following deficits and impairments:  Abnormal gait, Decreased range of motion, Difficulty walking, Increased muscle spasms, Decreased activity tolerance, Pain, Decreased balance, Impaired flexibility, Improper body mechanics, Postural dysfunction, Decreased strength, Decreased mobility  Visit Diagnosis: Acute bilateral low back pain without sciatica  Other abnormalities of gait and mobility  Muscle spasm of back  Muscle weakness (generalized)  Difficulty in walking, not elsewhere classified     Problem List Patient Active Problem List   Diagnosis Date Noted  . Hypomagnesemia 07/11/2017  . Nausea  vomiting and diarrhea 07/10/2017  . Hypokalemia 07/10/2017  . Chronic diastolic CHF (congestive heart failure) ([place]) 07/10/2017  . Seizure ([place]) 07/10/2017  . OSA (obstructive sleep apnea)  07/10/2017  . Sepsis ([place]) 07/10/2017  . Seizure disorder ([place]) 07/05/2017  . Pulmonary HTN ([place]) 07/05/2017  . Anxiety state   . Physical debility 06/23/2017  . 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 History of acute respiratory failure   . Tracheostomy, acute management ([place])   . Aspiration pneumonia ([place])   . Encephalopathy   . Status epilepticus ([place])   . Benign essential HTN   . 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 History of pulmonary embolism   . Acute blood loss anemia   . Diverticulitis of colon   . Tachypnea   . Encounter for orogastric (OG) tube placement   . Encounter for central line placement   . Encounter for attention to tracheostomy ([place])   . Acute respiratory failure with hypoxia ([place])   . Acute encephalopathy 06/06/2017  . Diverticulitis of large intestine without perforation or abscess without bleeding   . Abdominal pain   . Non-intractable cyclical vomiting with nausea   . Abnormal CT of the abdomen   . Hypocalcemia 05/27/2017  . 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 Colitis 05/27/2017  . Diabetic foot infection ([place]) 07/21/2015  . Diabetes mellitus type 2, controlled ([place]) 07/21/2015  . Cellulitis of foot, right 07/21/2015  . Cellulitis of right foot 07/21/2015  . Acute pulmonary embolism ([place]) 04/01/2013  . DVT (deep venous thrombosis) ([place]) 04/01/2013  . HX: breast cancer 04/01/2013  . Concussion 04/01/2013  . Diabetes ([place]) 04/01/2013  . Essential hypertension, benign 04/01/2013  . Hypothyroidism 04/01/2013  . OSA on CPAP 04/01/2013    [name] Boast., PT 11/27/2019, 3:29 PM  [place], [name], [address] Phone: [phone]   Fax:  [phone]  Name: Shanece Cochrane MRN: [id number] Date of Birth: [birth year]

## 2019-12-02 ENCOUNTER — Other Ambulatory Visit: Payer: Self-pay

## 2019-12-02 ENCOUNTER — Encounter: Payer: Self-pay | Admitting: Podiatry

## 2019-12-02 ENCOUNTER — Ambulatory Visit: Payer: Medicare PPO | Admitting: Podiatry

## 2019-12-02 ENCOUNTER — Encounter: Payer: Self-pay | Admitting: Physical Therapy

## 2019-12-02 ENCOUNTER — Ambulatory Visit: Payer: Medicare PPO | Admitting: Physical Therapy

## 2019-12-02 DIAGNOSIS — R262 Difficulty in walking, not elsewhere classified: Secondary | ICD-10-CM

## 2019-12-02 DIAGNOSIS — Q828 Other specified congenital malformations of skin: Secondary | ICD-10-CM

## 2019-12-02 DIAGNOSIS — M79674 Pain in right toe(s): Secondary | ICD-10-CM

## 2019-12-02 DIAGNOSIS — M545 Low back pain, unspecified: Secondary | ICD-10-CM

## 2019-12-02 DIAGNOSIS — M6281 Muscle weakness (generalized): Secondary | ICD-10-CM

## 2019-12-02 DIAGNOSIS — L84 Corns and callosities: Secondary | ICD-10-CM | POA: Diagnosis not present

## 2019-12-02 DIAGNOSIS — R2689 Other abnormalities of gait and mobility: Secondary | ICD-10-CM

## 2019-12-02 DIAGNOSIS — E119 Type 2 diabetes mellitus without complications: Secondary | ICD-10-CM | POA: Diagnosis not present

## 2019-12-02 DIAGNOSIS — M62830 Muscle spasm of back: Secondary | ICD-10-CM

## 2019-12-02 DIAGNOSIS — R5381 Other malaise: Secondary | ICD-10-CM

## 2019-12-02 DIAGNOSIS — Z9181 History of falling: Secondary | ICD-10-CM

## 2019-12-02 DIAGNOSIS — M79675 Pain in left toe(s): Secondary | ICD-10-CM

## 2019-12-02 DIAGNOSIS — B351 Tinea unguium: Secondary | ICD-10-CM

## 2019-12-02 NOTE — Therapy (Signed)
[place], [name], [address] Phone: [phone]   Fax:  [phone]  Physical Therapy Treatment  Patient Details  Name: Amanda Short MRN: [id number] Date of Birth: 1940-09-29 Referring Provider (PT): Rennard   Encounter Date: 12/02/2019  PT End of Session - 12/02/19 0927    Visit Number  3    Number of Visits  12    Date for PT Re-Evaluation  01/02/20    Authorization Type  [employer]    PT Start Time  0845    PT Stop Time  0928    PT Time Calculation (min)  43 min    Activity Tolerance  Patient limited by fatigue    Behavior During Therapy  [facility] for tasks assessed/performed       Past Medical History:  Diagnosis Date  . Arthritis    "back, hands" (07/22/2015)  . Cancer of left breast ([place]) 2006   S/P lumpectomy  . Chronic diastolic CHF (congestive heart failure) ([place]) 07/10/2017  . Complication of anesthesia    "brief breathing problem at surgery center in ~ 2005 when I had gallbladder OR"  . Concussion 03/2013   due to fall  . Depression   . Diverticulosis   . DVT (deep venous thrombosis) ([place]) 03/2013   LLE  . GERD (gastroesophageal reflux disease)   . Hyperlipidemia   . Hypertension   . Hypothyroidism   . OSA treated with BiPAP   . Pulmonary embolism ([place]) 03/2013  . Seizures ([place])   . Sleep apnea   . Thyroid disease    Hypothyroid  . Type II diabetes mellitus ([place])     Past Surgical History:  Procedure Laterality Date  . ABDOMINAL HYSTERECTOMY  1970s  . BREAST BIOPSY Left 2006  . BREAST LUMPECTOMY Left 2006  . BUNIONECTOMY WITH HAMMERTOE RECONSTRUCTION Left   . ESOPHAGOGASTRODUODENOSCOPY N/A 05/29/2017   Procedure: ESOPHAGOGASTRODUODENOSCOPY (EGD);  Surgeon: Irene Shipper, MD;  Location: [facility];  Service: Endoscopy;  Laterality: N/A;  . JOINT REPLACEMENT    . LAPAROSCOPIC CHOLECYSTECTOMY  ~ 2005  . TOTAL HIP ARTHROPLASTY Left 2010  . TOTAL KNEE ARTHROPLASTY  Bilateral 2005-2006    There were no vitals filed for this visit.  Subjective Assessment - 12/02/19 0849    Subjective  Patient reports that she was sore after the last visit, sore in the shoulders, the back and the legs    Currently in Pain?  Yes    Pain Score  2     Pain Location  Back    Pain Orientation  Lower    Aggravating Factors   stretching                        [facility] Adult PT Treatment/Exercise - 12/02/19 0001      High Level Balance   High Level Balance Activities  Side stepping;Backward walking;Tandem walking;Marching forwards    High Level Balance Comments  ball toss standing solid surfACE and on airex      Lumbar Exercises: Aerobic   UBE (Upper Arm Bike)  level 1 x 4 minutes direction changes every minute    Recumbent Bike  5 minutes not turned on but working on maintaining cadence, she cannot go fast enough to keep it on worked on 20 seconds at keeping RPM above 45 3x, after this O2 saturation was 80%      Lumbar Exercises: Standing   Row  Both;10  reps;Theraband    Theraband Level (Row)  Level 2 (Red)    Shoulder Extension  Both;10 reps    Theraband Level (Shoulder Extension)  Level 3 (Green)    Other Standing Lumbar Exercises  2.5# hip marching, abduction and extension 10x each leg      Lumbar Exercises: Seated   Other Seated Lumbar Exercises  4.4# trunk rotation touching the hips               PT Short Term Goals - 11/27/19 1528      PT SHORT TERM GOAL #1   Title  independent with intial HEP    Status  Partially Met        PT Long Term Goals - 12/02/19 0930      PT LONG TERM GOAL #1   Title  increase overall hip LE strength for functional gait and safety hips and ankles to 4+/5    Status  On-going      PT LONG TERM GOAL #2   Title  decrease TUG time to less than 14 seconds for functional gait and safety    Status  On-going            Plan - 12/02/19 0928    Clinical Impression Statement  Patient continues to  have severe O2 saturation issues, at times with exercise it would be 78%, again teaching pursed lip breathing she could get it up 8% after about 5-6 breaths.  She really struggles with balance side stepping and tandem gait especially.    PT Next Visit Plan  flexibility of LE's. core stability and work on balance, endurance    Consulted and Agree with Plan of Care  Patient       Patient will benefit from skilled therapeutic intervention in order to improve the following deficits and impairments:  Abnormal gait, Decreased range of motion, Difficulty walking, Increased muscle spasms, Decreased activity tolerance, Pain, Decreased balance, Impaired flexibility, Improper body mechanics, Postural dysfunction, Decreased strength, Decreased mobility  Visit Diagnosis: Acute bilateral low back pain without sciatica  Other abnormalities of gait and mobility  Muscle spasm of back  Muscle weakness (generalized)  Difficulty in walking, not elsewhere classified  Risk for falls  Physical debility     Problem List Patient Active Problem List   Diagnosis Date Noted  . Hypomagnesemia 07/11/2017  . Nausea vomiting and diarrhea 07/10/2017  . Hypokalemia 07/10/2017  . Chronic diastolic CHF (congestive heart failure) ([place]) 07/10/2017  . Seizure ([place]) 07/10/2017  . OSA (obstructive sleep apnea) 07/10/2017  . Sepsis ([place]) 07/10/2017  . Seizure disorder ([place]) 07/05/2017  . Pulmonary HTN ([place]) 07/05/2017  . Anxiety state   . Physical debility 06/23/2017  . 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 History of acute respiratory failure   . Tracheostomy, acute management ([place])   . Aspiration pneumonia ([place])   . Encephalopathy   . Status epilepticus ([place])   . Benign essential HTN   . 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 History of pulmonary embolism   . Acute blood loss anemia   . Diverticulitis of colon   . Tachypnea   . Encounter for orogastric (OG) tube placement   . Encounter for  central line placement   . Encounter for attention to tracheostomy ([place])   . Acute respiratory failure with hypoxia ([place])   . Acute encephalopathy 06/06/2017  . Diverticulitis of large intestine without perforation or abscess without bleeding   . Abdominal pain   .  Non-intractable cyclical vomiting with nausea   . Abnormal CT of the abdomen   . Hypocalcemia 05/27/2017  . 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 Colitis 05/27/2017  . Diabetic foot infection ([place]) 07/21/2015  . Diabetes mellitus type 2, controlled ([place]) 07/21/2015  . Cellulitis of foot, right 07/21/2015  . Cellulitis of right foot 07/21/2015  . Acute pulmonary embolism ([place]) 04/01/2013  . DVT (deep venous thrombosis) ([place]) 04/01/2013  . HX: breast cancer 04/01/2013  . Concussion 04/01/2013  . Diabetes ([place]) 04/01/2013  . Essential hypertension, benign 04/01/2013  . Hypothyroidism 04/01/2013  . OSA on CPAP 04/01/2013    [name] Boast., PT 12/02/2019, 9:30 AM  [place], [name], [address] Phone: [phone]   Fax:  [phone]  Name: Amanda Short MRN: [id number] Date of Birth: 1940/11/05

## 2019-12-02 NOTE — Patient Instructions (Signed)

## 2019-12-04 ENCOUNTER — Other Ambulatory Visit: Payer: Self-pay

## 2019-12-04 ENCOUNTER — Encounter: Payer: Self-pay | Admitting: Physical Therapy

## 2019-12-04 ENCOUNTER — Ambulatory Visit: Payer: Medicare PPO | Admitting: Physical Therapy

## 2019-12-04 DIAGNOSIS — M545 Low back pain, unspecified: Secondary | ICD-10-CM

## 2019-12-04 DIAGNOSIS — Z9181 History of falling: Secondary | ICD-10-CM

## 2019-12-04 DIAGNOSIS — M62830 Muscle spasm of back: Secondary | ICD-10-CM

## 2019-12-04 DIAGNOSIS — R262 Difficulty in walking, not elsewhere classified: Secondary | ICD-10-CM

## 2019-12-04 DIAGNOSIS — R2689 Other abnormalities of gait and mobility: Secondary | ICD-10-CM

## 2019-12-04 DIAGNOSIS — M6281 Muscle weakness (generalized): Secondary | ICD-10-CM

## 2019-12-04 DIAGNOSIS — R5381 Other malaise: Secondary | ICD-10-CM

## 2019-12-04 NOTE — Therapy (Signed)
[place], [name], [address] Phone: [phone]   Fax:  [phone]  Physical Therapy Treatment  Patient Details  Name: Amanda Short MRN: [id number] Date of Birth: 1941/06/10 Referring Provider (PT): Rennard   Encounter Date: 12/04/2019  PT End of Session - 12/04/19 1625    Visit Number  4    Number of Visits  12    Date for PT Re-Evaluation  01/02/20    Authorization Type  [employer]    PT Start Time  1526    PT Stop Time  1613    PT Time Calculation (min)  47 min    Activity Tolerance  Patient limited by fatigue    Behavior During Therapy  [facility] for tasks assessed/performed       Past Medical History:  Diagnosis Date  . Arthritis    "back, hands" (07/22/2015)  . Cancer of left breast ([place]) 2006   S/P lumpectomy  . Chronic diastolic CHF (congestive heart failure) ([place]) 07/10/2017  . Complication of anesthesia    "brief breathing problem at surgery center in ~ 2005 when I had gallbladder OR"  . Concussion 03/2013   due to fall  . Depression   . Diverticulosis   . DVT (deep venous thrombosis) ([place]) 03/2013   LLE  . GERD (gastroesophageal reflux disease)   . Hyperlipidemia   . Hypertension   . Hypothyroidism   . OSA treated with BiPAP   . Pulmonary embolism ([place]) 03/2013  . Seizures ([place])   . Sleep apnea   . Thyroid disease    Hypothyroid  . Type II diabetes mellitus ([place])     Past Surgical History:  Procedure Laterality Date  . ABDOMINAL HYSTERECTOMY  1970s  . BREAST BIOPSY Left 2006  . BREAST LUMPECTOMY Left 2006  . BUNIONECTOMY WITH HAMMERTOE RECONSTRUCTION Left   . ESOPHAGOGASTRODUODENOSCOPY N/A 05/29/2017   Procedure: ESOPHAGOGASTRODUODENOSCOPY (EGD);  Surgeon: Irene Shipper, MD;  Location: [facility];  Service: Endoscopy;  Laterality: N/A;  . JOINT REPLACEMENT    . LAPAROSCOPIC CHOLECYSTECTOMY  ~ 2005  . TOTAL HIP ARTHROPLASTY Left 2010  . TOTAL KNEE ARTHROPLASTY  Bilateral 2005-2006    There were no vitals filed for this visit.  Subjective Assessment - 12/04/19 1534    Subjective  Patient reports fatigue after treatment but not bad    Currently in Pain?  Yes    Pain Score  3     Pain Location  Back    Pain Orientation  Lower    Pain Descriptors / Indicators  Sore;Tightness                        [facility] Adult PT Treatment/Exercise - 12/04/19 0001      High Level Balance   High Level Balance Activities  Side stepping;Backward walking;Tandem walking;Marching forwards    High Level Balance Comments  ball toss standing solid surfACE and on airex, ball kicks with CGA, walking ball toss, standing on airex reaching      Lumbar Exercises: Aerobic   Recumbent Bike  5 minutes rest, and then     Nustep  level 4 x 5 minutes      Lumbar Exercises: Machines for Strengthening   Cybex Knee Extension  10# 3x10    Cybex Knee Flexion  25# 3x10      Lumbar Exercises: Seated   Other Seated Lumbar Exercises  bosu behind partial crunch  PT Short Term Goals - 12/04/19 1627      PT SHORT TERM GOAL #1   Title  independent with intial HEP    Status  Achieved        PT Long Term Goals - 12/02/19 0930      PT LONG TERM GOAL #1   Title  increase overall hip LE strength for functional gait and safety hips and ankles to 4+/5    Status  On-going      PT LONG TERM GOAL #2   Title  decrease TUG time to less than 14 seconds for functional gait and safety    Status  On-going            Plan - 12/04/19 1625    Clinical Impression Statement  O2 sats really drop with the aerobic activity to 75%, takes about 4 minutes to get back above 80%, with the weight lifting she had O2 sats of 84-85%,  Needs close CGA with ball kicks, on the airx an with walking and ball toss    PT Next Visit Plan  flexibility of LE's. core stability and work on balance, endurance    Consulted and Agree with Plan of Care  Patient        Patient will benefit from skilled therapeutic intervention in order to improve the following deficits and impairments:  Abnormal gait, Decreased range of motion, Difficulty walking, Increased muscle spasms, Decreased activity tolerance, Pain, Decreased balance, Impaired flexibility, Improper body mechanics, Postural dysfunction, Decreased strength, Decreased mobility  Visit Diagnosis: Acute bilateral low back pain without sciatica  Other abnormalities of gait and mobility  Muscle spasm of back  Muscle weakness (generalized)  Difficulty in walking, not elsewhere classified  Risk for falls  Physical debility     Problem List Patient Active Problem List   Diagnosis Date Noted  . Hypomagnesemia 07/11/2017  . Nausea vomiting and diarrhea 07/10/2017  . Hypokalemia 07/10/2017  . Chronic diastolic CHF (congestive heart failure) ([place]) 07/10/2017  . Seizure ([place]) 07/10/2017  . OSA (obstructive sleep apnea) 07/10/2017  . Sepsis ([place]) 07/10/2017  . Seizure disorder ([place]) 07/05/2017  . Pulmonary HTN ([place]) 07/05/2017  . Anxiety state   . Physical debility 06/23/2017  . 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 History of acute respiratory failure   . Tracheostomy, acute management ([place])   . Aspiration pneumonia ([place])   . Encephalopathy   . Status epilepticus ([place])   . Benign essential HTN   . 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 History of pulmonary embolism   . Acute blood loss anemia   . Diverticulitis of colon   . Tachypnea   . Encounter for orogastric (OG) tube placement   . Encounter for central line placement   . Encounter for attention to tracheostomy ([place])   . Acute respiratory failure with hypoxia ([place])   . Acute encephalopathy 06/06/2017  . Diverticulitis of large intestine without perforation or abscess without bleeding   . Abdominal pain   . Non-intractable cyclical vomiting with nausea   . Abnormal CT of the abdomen   . Hypocalcemia 05/27/2017  .  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 Colitis 05/27/2017  . Central retinal vein occlusion of right eye 04/20/2016  . Nuclear sclerotic cataract of both eyes 04/20/2016  . Diabetic foot infection ([place]) 07/21/2015  . Diabetes mellitus type 2, controlled ([place]) 07/21/2015  . Cellulitis of foot, right 07/21/2015  . Cellulitis of right foot 07/21/2015  . Acute pulmonary  embolism ([place]) 04/01/2013  . DVT (deep venous thrombosis) ([place]) 04/01/2013  . HX: breast cancer 04/01/2013  . Concussion 04/01/2013  . Diabetes ([place]) 04/01/2013  . Essential hypertension, benign 04/01/2013  . Hypothyroidism 04/01/2013  . OSA on CPAP 04/01/2013    [name] Boast., PT 12/04/2019, 4:27 PM  [place], [name], [address] Phone: [phone]   Fax:  [phone]  Name: Amanda Short MRN: [id number] Date of Birth: May 08, 1941

## 2019-12-08 NOTE — Progress Notes (Signed)
Subjective: Amanda Short presents today preventative diabetic foot care, callus(es) right foot. Aggravating factors include weightbearing with and without shoe gear. Pain is relieved with periodic professional debridement. and painful porokeratotic lesion(s) left hallux and painful mycotic toenails b/l that limit ambulation. Aggravating factors include weightbearing with and without shoe gear. Pain for both is relieved with periodic professional debridement.  Gloriann Loan, MD is patient's PCP.  Past Medical History:  Diagnosis Date  . Arthritis    "back, hands" (07/22/2015)  . Cancer of left breast ([place]) 2006   S/P lumpectomy  . Chronic diastolic CHF (congestive heart failure) ([place]) 07/10/2017  . Complication of anesthesia    "brief breathing problem at surgery center in ~ 2005 when I had gallbladder OR"  . Concussion 03/2013   due to fall  . Depression   . Diverticulosis   . DVT (deep venous thrombosis) ([place]) 03/2013   LLE  . GERD (gastroesophageal reflux disease)   . Hyperlipidemia   . Hypertension   . Hypothyroidism   . OSA treated with BiPAP   . Pulmonary embolism ([place]) 03/2013  . Seizures ([place])   . Sleep apnea   . Thyroid disease    Hypothyroid  . Type II diabetes mellitus ([place])      Current Outpatient Medications on File Prior to Visit  Medication Sig Dispense Refill  . acetaminophen (TYLENOL) 500 MG tablet Take 500 mg by mouth every 6 (six) hours as needed for mild pain.    Marland Kitchen ALPRAZolam (XANAX) 0.5 MG tablet Take 0.5 mg by mouth at bedtime.    Marland Kitchen atorvastatin (LIPITOR) 20 MG tablet Take 20 mg by mouth daily.    . calcium-vitamin D (OSCAL 500/200 D-3) 500-200 MG-UNIT tablet Take 1 tablet 2 (two) times daily by mouth. 20 tablet 0  . diltiazem (CARDIZEM) 30 MG tablet Take 30 mg by mouth daily.    . DULoxetine (CYMBALTA) 60 MG capsule Take 60 mg by mouth daily.    . febuxostat (ULORIC) 40 MG tablet Take 40 mg daily by mouth.    . furosemide (LASIX) 40 MG tablet Take  20 mg by mouth daily.    . insulin degludec (TRESIBA FLEXTOUCH) 100 UNIT/ML SOPN FlexTouch Pen     . levETIRAcetam (KEPPRA) 500 MG tablet Take 500 mg by mouth 2 (two) times daily.    Marland Kitchen levothyroxine (SYNTHROID, LEVOTHROID) 100 MCG tablet Take 100 mcg by mouth daily before breakfast.    . metoprolol (TOPROL-XL) 200 MG 24 hr tablet Take 100 mg by mouth at bedtime.     . montelukast (SINGULAIR) 10 MG tablet Take 10 mg at bedtime by mouth.    . Multiple Vitamin (MULTIVITAMIN WITH MINERALS) TABS tablet Take 1 tablet daily by mouth.    . nateglinide (STARLIX) 120 MG tablet Take 120 mg by mouth 3 (three) times daily with meals.     Marland Kitchen omeprazole (PRILOSEC) 40 MG capsule Take 40 mg by mouth 2 (two) times daily.    . Rivaroxaban (XARELTO) 15 MG TABS tablet Take 15 mg by mouth daily.    . Semaglutide (OZEMPIC, 1 MG/DOSE, [place]) Inject into the skin.    Marland Kitchen spironolactone (ALDACTONE) 25 MG tablet Take 25 mg by mouth 2 (two) times daily.     No current facility-administered medications on file prior to visit.     Allergies  Allergen Reactions  . Allopurinol Nausea And Vomiting    Objective: Amanda Short is a pleasant 79 y.o. y.o. Patient Race: Dema Severin or Caucasian [1]  female in NAD. AAO x 3.  There were no vitals filed for this visit.  Vascular Examination: Neurovascular status unchanged b/l. Capillary refill time to digits immediate b/l. Palpable DP pulses b/l. Palpable PT pulses b/l. Pedal hair sparse b/l. Skin temperature gradient within normal limits b/l.  Dermatological Examination: Pedal skin with normal turgor, texture and tone bilaterally. No open wounds bilaterally. No interdigital macerations bilaterally. Toenails 1-5 b/l elongated, dystrophic, thickened, crumbly with subungual debris and tenderness to dorsal palpation. Hyperkeratotic lesion(s) R hallux and submet head 2 right foot.  No erythema, no edema, no drainage, no flocculence. Porokeratotic lesion(s) L hallux. No erythema, no  edema, no drainage, no flocculence.  Musculoskeletal: Normal muscle strength 5/5 to all lower extremity muscle groups bilaterally. No pain crepitus or joint limitation noted with ROM b/l. Hallux valgus with bunion deformity noted b/l.  Neurological Examination: Protective sensation intact 5/5 intact bilaterally with 10g monofilament b/l. Vibratory sensation intact b/l.  Assessment: 1. Pain due to onychomycosis of toenails of both feet   2. Porokeratosis   3. Callus   4. Diabetes mellitus type 2 in nonobese [facility])    Plan: -Examined patient. -Continue diabetic foot care principles. Literature dispensed on today.  -Toenails 1-5 b/l were debrided in length and girth with sterile nail nippers and dremel without iatrogenic bleeding.  -Callus(es) R hallux and submet head 2 right foot pared utilizing sterile scalpel blade without complication or incident. Total number debrided =2. -Painful porokeratotic lesion(s) L hallux pared and enucleated with sterile scalpel blade without incident. -Patient to continue soft, supportive shoe gear daily. -Patient to report any pedal injuries to medical professional immediately. -Dispensed Silipos toe cap for left hallux for daily protection. Apply every morning. Remove every evening. -Patient/POA to call should there be question/concern in the interim.  Return in about 3 months (around 03/03/2020) for nail and callus trim/ Xarelto.  Marzetta Board, DPM

## 2019-12-09 ENCOUNTER — Ambulatory Visit: Payer: Medicare PPO | Admitting: Physical Therapy

## 2019-12-11 ENCOUNTER — Encounter: Payer: Self-pay | Admitting: Physical Therapy

## 2019-12-11 ENCOUNTER — Ambulatory Visit: Payer: Medicare PPO | Admitting: Physical Therapy

## 2019-12-11 ENCOUNTER — Other Ambulatory Visit: Payer: Self-pay

## 2019-12-11 DIAGNOSIS — Z9181 History of falling: Secondary | ICD-10-CM

## 2019-12-11 DIAGNOSIS — M6281 Muscle weakness (generalized): Secondary | ICD-10-CM

## 2019-12-11 DIAGNOSIS — M545 Low back pain, unspecified: Secondary | ICD-10-CM

## 2019-12-11 DIAGNOSIS — M62830 Muscle spasm of back: Secondary | ICD-10-CM

## 2019-12-11 DIAGNOSIS — R2689 Other abnormalities of gait and mobility: Secondary | ICD-10-CM

## 2019-12-11 DIAGNOSIS — R262 Difficulty in walking, not elsewhere classified: Secondary | ICD-10-CM

## 2019-12-11 DIAGNOSIS — R5381 Other malaise: Secondary | ICD-10-CM

## 2019-12-11 NOTE — Therapy (Signed)
[place], [name], [address] Phone: [phone]   Fax:  [phone]  Physical Therapy Treatment  Patient Details  Name: Amanda Short MRN: [id number] Date of Birth: [birth year] Referring Provider (PT): Rennard   Encounter Date: 12/11/2019  PT End of Session - 12/11/19 7841    Visit Number  5    Number of Visits  12    Date for PT Re-Evaluation  01/02/20    Authorization Type  [employer]    PT Start Time  1440    PT Stop Time  1525    PT Time Calculation (min)  45 min    Activity Tolerance  Patient limited by fatigue    Behavior During Therapy  [facility] for tasks assessed/performed       Past Medical History:  Diagnosis Date  . Arthritis    "back, hands" (07/22/2015)  . Cancer of left breast ([place]) 2006   S/P lumpectomy  . Chronic diastolic CHF (congestive heart failure) ([place]) 07/10/2017  . Complication of anesthesia    "brief breathing problem at surgery center in ~ 2005 when I had gallbladder OR"  . Concussion 03/2013   due to fall  . Depression   . Diverticulosis   . DVT (deep venous thrombosis) ([place]) 03/2013   LLE  . GERD (gastroesophageal reflux disease)   . Hyperlipidemia   . Hypertension   . Hypothyroidism   . OSA treated with BiPAP   . Pulmonary embolism ([place]) 03/2013  . Seizures ([place])   . Sleep apnea   . Thyroid disease    Hypothyroid  . Type II diabetes mellitus ([place])     Past Surgical History:  Procedure Laterality Date  . ABDOMINAL HYSTERECTOMY  1970s  . BREAST BIOPSY Left 2006  . BREAST LUMPECTOMY Left 2006  . BUNIONECTOMY WITH HAMMERTOE RECONSTRUCTION Left   . ESOPHAGOGASTRODUODENOSCOPY N/A 05/29/2017   Procedure: ESOPHAGOGASTRODUODENOSCOPY (EGD);  Surgeon: Irene Shipper, MD;  Location: [facility];  Service: Endoscopy;  Laterality: N/A;  . JOINT REPLACEMENT    . LAPAROSCOPIC CHOLECYSTECTOMY  ~ 2005  . TOTAL HIP ARTHROPLASTY Left 2010  . TOTAL KNEE ARTHROPLASTY  Bilateral 2005-2006    There were no vitals filed for this visit.  Subjective Assessment - 12/11/19 1446    Subjective  Patietn reports that she feels like her balance is a little better    Currently in Pain?  Yes    Pain Score  3     Pain Location  Back    Pain Orientation  Lower    Aggravating Factors   activity                        [facility] Adult PT Treatment/Exercise - 12/11/19 0001      High Level Balance   High Level Balance Activities  Side stepping;Backward walking;Tandem walking;Marching forwards;Direction changes;Sudden stops    High Level Balance Comments  ball toss standing solid surfACE and on airex, ball kicks with CGA, walking ball toss, standing on airex reaching, on airex eyes closed      Lumbar Exercises: Stretches   Passive Hamstring Stretch  Right;Left;4 reps;20 seconds    Piriformis Stretch  Right;Left;4 reps;20 seconds      Lumbar Exercises: Aerobic   UBE (Upper Arm Bike)  level 4 x 4 minutes    Nustep  level 4 x 5 minutes      Lumbar Exercises: Machines for Strengthening  Leg Press  20# 2x10, and then single legs 2x 5 each      Lumbar Exercises: Standing   Row  Both;10 reps;Theraband    Theraband Level (Row)  Level 3 (Green)    Row Limitations  on airex    Shoulder Extension  Both;10 reps    Theraband Level (Shoulder Extension)  Level 3 (Green)    Shoulder Extension Limitations  on airex    Other Standing Lumbar Exercises  2.5# hip marching, abduction and extension 10x each leg      Lumbar Exercises: Supine   Bridge with Ball Squeeze  10 reps;1 second    Other Supine Lumbar Exercises  shoulder AAROM               PT Short Term Goals - 12/04/19 1627      PT SHORT TERM GOAL #1   Title  independent with intial HEP    Status  Achieved        PT Long Term Goals - 12/11/19 1627      PT LONG TERM GOAL #1   Title  increase overall hip LE strength for functional gait and safety hips and ankles to 4+/5    Status   On-going      PT LONG TERM GOAL #2   Title  decrease TUG time to less than 14 seconds for functional gait and safety    Status  On-going      PT LONG TERM GOAL #3   Title  increase BERG to 50/56 for safety and independence    Status  On-going      PT LONG TERM GOAL #4   Title  decrease LBP 50% with activities    Status  On-going      PT LONG TERM GOAL #5   Title  increase lumbar ROM 25%    Status  On-going            Plan - 12/11/19 1614    Clinical Impression Statement  Patient report stha she feels more steady with walking, she report sthat she feels like she is breathing better with her pursed lipped breathing, I tested O2 after some of the cardio and it never dropped below 82% today and seemed to recover easier.    PT Next Visit Plan  flexibility of LE's. core stability and work on balance, endurance    Consulted and Agree with Plan of Care  Patient       Patient will benefit from skilled therapeutic intervention in order to improve the following deficits and impairments:  Abnormal gait, Decreased range of motion, Difficulty walking, Increased muscle spasms, Decreased activity tolerance, Pain, Decreased balance, Impaired flexibility, Improper body mechanics, Postural dysfunction, Decreased strength, Decreased mobility  Visit Diagnosis: Acute bilateral low back pain without sciatica  Other abnormalities of gait and mobility  Muscle spasm of back  Muscle weakness (generalized)  Difficulty in walking, not elsewhere classified  Risk for falls  Physical debility     Problem List Patient Active Problem List   Diagnosis Date Noted  . Hypomagnesemia 07/11/2017  . Nausea vomiting and diarrhea 07/10/2017  . Hypokalemia 07/10/2017  . Chronic diastolic CHF (congestive heart failure) ([place]) 07/10/2017  . Seizure ([place]) 07/10/2017  . OSA (obstructive sleep apnea) 07/10/2017  . Sepsis ([place]) 07/10/2017  . Seizure disorder ([place]) 07/05/2017  . Pulmonary HTN ([place])  07/05/2017  . Anxiety state   . Physical debility 06/23/2017  . 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 History of acute  respiratory failure   . Tracheostomy, acute management ([place])   . Aspiration pneumonia ([place])   . Encephalopathy   . Status epilepticus ([place])   . Benign essential HTN   . 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 History of pulmonary embolism   . Acute blood loss anemia   . Diverticulitis of colon   . Tachypnea   . Encounter for orogastric (OG) tube placement   . Encounter for central line placement   . Encounter for attention to tracheostomy ([place])   . Acute respiratory failure with hypoxia ([place])   . Acute encephalopathy 06/06/2017  . Diverticulitis of large intestine without perforation or abscess without bleeding   . Abdominal pain   . Non-intractable cyclical vomiting with nausea   . Abnormal CT of the abdomen   . Hypocalcemia 05/27/2017  . 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 Colitis 05/27/2017  . Central retinal vein occlusion of right eye 04/20/2016  . Nuclear sclerotic cataract of both eyes 04/20/2016  . Diabetic foot infection ([place]) 07/21/2015  . Diabetes mellitus type 2, controlled ([place]) 07/21/2015  . Cellulitis of foot, right 07/21/2015  . Cellulitis of right foot 07/21/2015  . Acute pulmonary embolism ([place]) 04/01/2013  . DVT (deep venous thrombosis) ([place]) 04/01/2013  . HX: breast cancer 04/01/2013  . Concussion 04/01/2013  . Diabetes ([place]) 04/01/2013  . Essential hypertension, benign 04/01/2013  . Hypothyroidism 04/01/2013  . OSA on CPAP 04/01/2013    [name] Boast., PT 12/11/2019, 4:29 PM  [place], [name], [address] Phone: [phone]   Fax:  [phone]  Name: Raymonde Hamblin MRN: [id number] Date of Birth: 10/16/1940

## 2019-12-16 ENCOUNTER — Other Ambulatory Visit: Payer: Self-pay

## 2019-12-16 ENCOUNTER — Ambulatory Visit (HOSPITAL_COMMUNITY)
Admission: EM | Admit: 2019-12-16 | Discharge: 2019-12-16 | Disposition: A | Discharge status: Home / Self Care | Payer: Medicare PPO | Attending: Family Medicine | Admitting: Family Medicine

## 2019-12-16 DIAGNOSIS — N3090 Cystitis, unspecified without hematuria: Secondary | ICD-10-CM | POA: Diagnosis present

## 2019-12-16 DIAGNOSIS — R310 Gross hematuria: Secondary | ICD-10-CM | POA: Insufficient documentation

## 2019-12-16 DIAGNOSIS — R300 Dysuria: Secondary | ICD-10-CM | POA: Insufficient documentation

## 2019-12-16 MED ORDER — CEPHALEXIN 500 MG PO CAPS: 500.0000 mg | ORAL_CAPSULE | Freq: Two times a day (BID) | ORAL | 0 refills | Status: DC

## 2019-12-16 NOTE — ED Triage Notes (Signed)
On Mondays pt reports going to the bathroom around 1 am and it was a steady stream of bright red blood. All day Monday and at night. Today urine is "normal". Pt states burning and discomfort  when urinating and frequency.

## 2019-12-16 NOTE — ED Provider Notes (Signed)
[place]    ASSESSMENT & PLAN:  1. Dysuria   2. Gross hematuria   3. Cystitis     Begin: Meds ordered this encounter  Medications   cephALEXin (KEFLEX) 500 MG capsule    Sig: Take 1 capsule (500 mg total) by mouth 2 (two) times daily.    Dispense:  10 capsule    Refill:  0    No signs of pyelonephritis. Discussed. Urine culture sent. Will notify of any significant results.   Outlined signs and symptoms indicating need for more acute intervention. Patient verbalized understanding. After Visit Summary given.  SUBJECTIVE:  Amanda Short is a 79 y.o. female who complains of urinary frequency, urgency and dysuria for the past 1-2 days. Without associated flank pain, fever, chills, vaginal discharge or bleeding. Gross hematuria: present initially but now resolved. No specific aggravating or alleviating factors reported. No LE edema. Normal PO intake without n/v/d. Without specific abdominal pain. Ambulatory without difficulty. OTC treatment: none. H/O UTI: infrequent.  LMP: No LMP recorded. Patient has had a hysterectomy.    OBJECTIVE:  Vitals:   12/16/19 1542  BP: 136/78  Pulse: 61  Resp: 16  Temp: 97.9 F (36.6 C)  TempSrc: Oral  SpO2: 94%   General appearance: alert; no distress Lungs: unlabored respirations Abdomen: soft Extremities: no edema Skin: warm and dry Neurologic: normal gait but slow Psychological: alert and cooperative; normal mood and affect  Labs Reviewed  URINE CULTURE    Allergies  Allergen Reactions   Allopurinol Nausea And Vomiting    Past Medical History:  Diagnosis Date   Arthritis    "back, hands" (07/22/2015)   Cancer of left breast ([place]) 2006   S/P lumpectomy   Chronic diastolic CHF (congestive heart failure) ([place]) 24/03/7352   Complication of anesthesia    "brief breathing problem at surgery center in ~ 2005 when I had gallbladder OR"   Concussion 03/2013   due to fall   Depression     Diverticulosis    DVT (deep venous thrombosis) ([place]) 03/2013   LLE   GERD (gastroesophageal reflux disease)    Hyperlipidemia    Hypertension    Hypothyroidism    OSA treated with BiPAP    Pulmonary embolism ([place]) 03/2013   Seizures ([place])    Sleep apnea    Thyroid disease    Hypothyroid   Type II diabetes mellitus ([place])    Social History   Socioeconomic History   Marital status: Married    Spouse name: Not on file   Number of children: 1   Years of education: Not on file   Highest education level: Not on file  Occupational History   Occupation: retired  Tobacco Use   Smoking status: Former Smoker    Packs/day: 1.00    Years: 30.00    Pack years: 30.00    Types: Cigarettes    Quit date: 1995    Years since quitting: 26.4   Smokeless tobacco: Never Used   Tobacco comment: "quit smoking cigarettes in the 1990s"  Substance and Sexual Activity   Alcohol use: No   Drug use: No   Sexual activity: Not Currently  Other Topics Concern   Not on file  Social History Narrative   Not on file   Social Determinants of Health   Financial Resource Strain:    Difficulty of Paying Living Expenses:   Food Insecurity:    Worried About Estate manager/land agent of Food in the Last Year:  Ran Out of Food in the Last Year:   Transportation Needs:    Film/video editor (Medical):    Lack of Transportation (Non-Medical):   Physical Activity:    Days of Exercise per Week:    Minutes of Exercise per Session:   Stress:    Feeling of Stress :   Social Connections:    Frequency of Communication with Friends and Family:    Frequency of Social Gatherings with Friends and Family:    Attends Religious Services:    Active Member of Clubs or Organizations:    Attends Music therapist:    Marital Status:   Intimate Partner Violence:    Fear of Current or Ex-Partner:    Emotionally Abused:    Physically Abused:    Sexually Abused:    Family  History  Problem Relation Age of Onset   Diabetes Other    Breast cancer Sister    Liver cancer Mother    Uterine cancer Mother    Hypertension Mother    Colon cancer Maternal Grandmother    Breast cancer Cousin        couple   Kidney disease Sister    Diabetes Sister        Vanessa Kick, MD 12/16/19 1650

## 2019-12-17 LAB — POCT URINALYSIS DIP (DEVICE)
Bilirubin Urine: NEGATIVE
Glucose, UA: NEGATIVE mg/dL
Ketones, ur: NEGATIVE mg/dL
Nitrite: NEGATIVE
Protein, ur: NEGATIVE mg/dL
Specific Gravity, Urine: 1.015 (ref 1.005–1.030)
Urobilinogen, UA: 0.2 mg/dL (ref 0.0–1.0)
pH: 5 (ref 5.0–8.0)

## 2019-12-18 ENCOUNTER — Other Ambulatory Visit: Payer: Self-pay

## 2019-12-18 LAB — URINE CULTURE: Culture: 70000 — AB

## 2019-12-19 ENCOUNTER — Encounter: Payer: Self-pay | Admitting: Family Medicine

## 2019-12-19 ENCOUNTER — Ambulatory Visit: Payer: Medicare PPO | Admitting: Family Medicine

## 2019-12-19 VITALS — BP 116/64 | HR 90 | Temp 96.8°F | Ht 61.0 in | Wt 157.6 lb

## 2019-12-19 DIAGNOSIS — I272 Pulmonary hypertension, unspecified: Secondary | ICD-10-CM

## 2019-12-19 DIAGNOSIS — I10 Essential (primary) hypertension: Secondary | ICD-10-CM | POA: Diagnosis not present

## 2019-12-19 DIAGNOSIS — N3001 Acute cystitis with hematuria: Secondary | ICD-10-CM | POA: Diagnosis not present

## 2019-12-19 DIAGNOSIS — I5032 Chronic diastolic (congestive) heart failure: Secondary | ICD-10-CM | POA: Diagnosis not present

## 2019-12-19 HISTORY — DX: Acute cystitis with hematuria: N30.01

## 2019-12-19 MED ORDER — CEPHALEXIN 500 MG PO CAPS: 500.0000 mg | ORAL_CAPSULE | Freq: Three times a day (TID) | ORAL | 0 refills | Status: AC

## 2019-12-19 NOTE — Progress Notes (Signed)
New Patient Office Visit  Subjective:  Patient ID: Amanda Short, female    DOB: 10-Feb-1941  Age: 79 y.o. MRN: [id number]  CC:  Chief Complaint  Patient presents with  . Establish Care    New patient, concerns about blood in urine. Per pt she was dx with UTI 3 days ago still on antibiotics. She still has blood in her urine with some dysuria.     HPI Amanda Short presents for follow-up of a UTI diagnosed 4 days ago at urgent care.  Review of the medical record shows E. coli at 70,000 CFU's sensitive to administered Keflex.  Patient continues to experience some hematuria and is concerned.  She denies ongoing fever chills nausea or vomiting.  Dysuria and frequency have lessened.  Complex complex medical history with multiple issues.  She does may results of her recent physical this past April with her former doctor.  Everything looked okay from his note.  Past Medical History:  Diagnosis Date  . Arthritis    "back, hands" (07/22/2015)  . Cancer of left breast ([place]) 2006   S/P lumpectomy  . Chronic diastolic CHF (congestive heart failure) ([place]) 07/10/2017  . Complication of anesthesia    "brief breathing problem at surgery center in ~ 2005 when I had gallbladder OR"  . Concussion 03/2013   due to fall  . Depression   . Diverticulosis   . DVT (deep venous thrombosis) ([place]) 03/2013   LLE  . GERD (gastroesophageal reflux disease)   . Hyperlipidemia   . Hypertension   . Hypothyroidism   . OSA treated with BiPAP   . Pulmonary embolism ([place]) 03/2013  . Seizures ([place])   . Sleep apnea   . Thyroid disease    Hypothyroid  . Type II diabetes mellitus ([place])     Past Surgical History:  Procedure Laterality Date  . ABDOMINAL HYSTERECTOMY  1970s  . BREAST BIOPSY Left 2006  . BREAST LUMPECTOMY Left 2006  . BUNIONECTOMY WITH HAMMERTOE RECONSTRUCTION Left   . ESOPHAGOGASTRODUODENOSCOPY N/A 05/29/2017   Procedure: ESOPHAGOGASTRODUODENOSCOPY (EGD);  Surgeon: Irene Shipper, MD;   Location: [facility];  Service: Endoscopy;  Laterality: N/A;  . JOINT REPLACEMENT    . LAPAROSCOPIC CHOLECYSTECTOMY  ~ 2005  . TOTAL HIP ARTHROPLASTY Left 2010  . TOTAL KNEE ARTHROPLASTY Bilateral 2005-2006    Family History  Problem Relation Age of Onset  . Diabetes Other   . Breast cancer Sister   . Liver cancer Mother   . Uterine cancer Mother   . Hypertension Mother   . Colon cancer Maternal Grandmother   . Breast cancer Cousin        couple  . Kidney disease Sister   . Diabetes Sister     Social History   Socioeconomic History  . Marital status: Married    Spouse name: Not on file  . Number of children: 1  . Years of education: Not on file  . Highest education level: Not on file  Occupational History  . Occupation: retired  Tobacco Use  . Smoking status: Former Smoker    Packs/day: 1.00    Years: 30.00    Pack years: 30.00    Types: Cigarettes    Quit date: 1995    Years since quitting: 26.4  . Smokeless tobacco: Never Used  . Tobacco comment: "quit smoking cigarettes in the 1990s"  Substance and Sexual Activity  . Alcohol use: No  . Drug use: No  . Sexual activity: Not Currently  Other Topics Concern  . Not on file  Social History Narrative  . Not on file   Social Determinants of Health   Financial Resource Strain:   . Difficulty of Paying Living Expenses:   Food Insecurity:   . Worried About Charity fundraiser in the Last Year:   . Arboriculturist in the Last Year:   Transportation Needs:   . Film/video editor (Medical):   Marland Kitchen Lack of Transportation (Non-Medical):   Physical Activity:   . Days of Exercise per Week:   . Minutes of Exercise per Session:   Stress:   . Feeling of Stress :   Social Connections:   . Frequency of Communication with Friends and Family:   . Frequency of Social Gatherings with Friends and Family:   . Attends Religious Services:   . Active Member of Clubs or Organizations:   . Attends Archivist  Meetings:   Marland Kitchen Marital Status:   Intimate Partner Violence:   . Fear of Current or Ex-Partner:   . Emotionally Abused:   Marland Kitchen Physically Abused:   . Sexually Abused:     ROS Review of Systems  Constitutional: Negative for chills, fever and unexpected weight change.  Respiratory: Negative for choking and wheezing.   Cardiovascular: Negative for chest pain and palpitations.  Gastrointestinal: Negative for abdominal pain, nausea and vomiting.  Endocrine: Negative for polyphagia and polyuria.  Genitourinary: Positive for hematuria. Negative for decreased urine volume, difficulty urinating and dysuria.  Skin: Negative for pallor and rash.  Allergic/Immunologic: Negative for immunocompromised state.  Neurological: Negative for speech difficulty and weakness.  Psychiatric/Behavioral: Negative.    Depression screen [facility] 2/9 12/19/2019 12/19/2019  Decreased Interest 0 0  Down, Depressed, Hopeless 0 0  PHQ - 2 Score 0 0  Altered sleeping 0 -  Tired, decreased energy 1 -  Change in appetite 0 -  Feeling bad or failure about yourself  0 -  Trouble concentrating 0 -  Moving slowly or fidgety/restless 0 -  Suicidal thoughts 0 -  PHQ-9 Score 1 -  Difficult doing work/chores Somewhat difficult -     Objective:   Today's Vitals: BP 116/64   Pulse 90   Temp (!) 96.8 F (36 C) (Tympanic)   Ht _0  (1.549 m)   Wt 157 lb 9.6 oz (71.5 kg)   SpO2 93%   BMI 29.78 kg/m   Physical Exam Nursing note reviewed.  Constitutional:      General: She is not in acute distress.    Appearance: Normal appearance. She is not ill-appearing, toxic-appearing or diaphoretic.  HENT:     Head: Normocephalic and atraumatic.     Right Ear: Tympanic membrane, ear canal and external ear normal.     Left Ear: Tympanic membrane, ear canal and external ear normal.  Eyes:     General: No scleral icterus.       Right eye: No discharge.        Left eye: No discharge.     Extraocular Movements: Extraocular movements  intact.     Conjunctiva/sclera: Conjunctivae normal.  Cardiovascular:     Rate and Rhythm: Normal rate and regular rhythm.  Pulmonary:     Effort: Pulmonary effort is normal.     Breath sounds: Normal breath sounds. No rhonchi.  Abdominal:     General: Bowel sounds are normal.     Tenderness: There is no right CVA tenderness or left CVA tenderness.  Musculoskeletal:  Cervical back: No rigidity or tenderness.  Lymphadenopathy:     Cervical: No cervical adenopathy.  Skin:    General: Skin is warm and dry.  Neurological:     Mental Status: She is alert and oriented to person, place, and time.  Psychiatric:        Mood and Affect: Mood normal.        Behavior: Behavior normal.     Assessment & Plan:   Problem List Items Addressed This Visit      Cardiovascular and Mediastinum   Essential hypertension, benign   Relevant Orders   Ambulatory referral to Cardiology   Pulmonary HTN [facility])   Relevant Orders   Ambulatory referral to Cardiology   Chronic diastolic CHF (congestive heart failure) ([place])   Relevant Orders   Ambulatory referral to Cardiology     Genitourinary   Acute cystitis with hematuria - Primary   Relevant Medications   cephALEXin (KEFLEX) 500 MG capsule      Outpatient Encounter Medications as of 12/19/2019  Medication Sig  . acetaminophen (TYLENOL) 500 MG tablet Take 500 mg by mouth every 6 (six) hours as needed for mild pain.  Marland Kitchen ALPRAZolam (XANAX) 0.5 MG tablet Take 0.5 mg by mouth at bedtime as needed for anxiety.  Marland Kitchen atorvastatin (LIPITOR) 20 MG tablet Take 20 mg by mouth daily.  . BD PEN NEEDLE NANO 2ND GEN 32G X 4 MM MISC daily.  . cephALEXin (KEFLEX) 500 MG capsule Take 1 capsule (500 mg total) by mouth 2 (two) times daily.  Marland Kitchen diltiazem (CARDIZEM) 30 MG tablet Take 30 mg by mouth daily.  . DULoxetine (CYMBALTA) 60 MG capsule Take 60 mg by mouth daily.  . febuxostat (ULORIC) 40 MG tablet Take 40 mg daily by mouth.  . furosemide (LASIX) 40 MG tablet  Take 20 mg by mouth daily.  . insulin degludec (TRESIBA FLEXTOUCH) 100 UNIT/ML SOPN FlexTouch Pen   . levETIRAcetam (KEPPRA) 500 MG tablet Take 500 mg by mouth 2 (two) times daily.  Marland Kitchen levothyroxine (SYNTHROID, LEVOTHROID) 100 MCG tablet Take 100 mcg by mouth daily before breakfast.  . metoprolol (TOPROL-XL) 200 MG 24 hr tablet Take 100 mg by mouth at bedtime.   . montelukast (SINGULAIR) 10 MG tablet Take 10 mg at bedtime by mouth.  . Multiple Vitamin (MULTIVITAMIN WITH MINERALS) TABS tablet Take 1 tablet daily by mouth.  . nateglinide (STARLIX) 120 MG tablet Take 120 mg by mouth 3 (three) times daily with meals.   Marland Kitchen omeprazole (PRILOSEC) 40 MG capsule Take 40 mg by mouth 2 (two) times daily.  Marland Kitchen OZEMPIC, 0.25 OR 0.5 MG/DOSE, 2 MG/1.5ML SOPN Inject 0.5 mg into the skin once a week.  . Rivaroxaban (XARELTO) 15 MG TABS tablet Take 15 mg by mouth daily.  Marland Kitchen spironolactone (ALDACTONE) 25 MG tablet Take 25 mg by mouth 2 (two) times daily.  . calcium-vitamin D (OSCAL 500/200 D-3) 500-200 MG-UNIT tablet Take 1 tablet 2 (two) times daily by mouth.  . cephALEXin (KEFLEX) 500 MG capsule Take 1 capsule (500 mg total) by mouth 3 (three) times daily for 5 days.  . Semaglutide (OZEMPIC, 1 MG/DOSE, [place]) Inject into the skin.   No facility-administered encounter medications on file as of 12/19/2019.    Follow-up: Return in about 3 months (around 03/20/2020), or return if bleeding doesn't resolve in the next week.. Have extended course of therapy for patients UTI for 5 days.  Believe that hematuria is also related to therapy with Xarelto and discussed this  with patient and her husband.  They request referral to cardiologist for follow-up of diastolic heart failure and pulmonary hypertension.  Libby Maw, MD

## 2019-12-24 ENCOUNTER — Ambulatory Visit: Payer: Medicare PPO | Attending: Family Medicine | Admitting: Physical Therapy

## 2019-12-24 ENCOUNTER — Encounter: Payer: Self-pay | Admitting: Physical Therapy

## 2019-12-24 ENCOUNTER — Other Ambulatory Visit: Payer: Self-pay

## 2019-12-24 DIAGNOSIS — M6281 Muscle weakness (generalized): Secondary | ICD-10-CM | POA: Diagnosis present

## 2019-12-24 DIAGNOSIS — R5381 Other malaise: Secondary | ICD-10-CM | POA: Insufficient documentation

## 2019-12-24 DIAGNOSIS — R2689 Other abnormalities of gait and mobility: Secondary | ICD-10-CM | POA: Diagnosis present

## 2019-12-24 DIAGNOSIS — R262 Difficulty in walking, not elsewhere classified: Secondary | ICD-10-CM | POA: Insufficient documentation

## 2019-12-24 DIAGNOSIS — M62830 Muscle spasm of back: Secondary | ICD-10-CM | POA: Diagnosis present

## 2019-12-24 DIAGNOSIS — M545 Low back pain, unspecified: Secondary | ICD-10-CM

## 2019-12-24 DIAGNOSIS — Z9181 History of falling: Secondary | ICD-10-CM | POA: Insufficient documentation

## 2019-12-24 NOTE — Therapy (Signed)
[place], [name], [address] Phone: [phone]   Fax:  [phone]  Physical Therapy Treatment  Patient Details  Name: Amanda Short MRN: [id number] Date of Birth: 01-Mar-1941 Referring Provider (PT): Rennard   Encounter Date: 12/24/2019  PT End of Session - 12/24/19 9924    Visit Number  6    Number of Visits  12    Date for PT Re-Evaluation  01/02/20    Authorization Type  [employer]    PT Start Time  0845    PT Stop Time  0930    PT Time Calculation (min)  45 min    Activity Tolerance  Patient limited by fatigue    Behavior During Therapy  [facility] for tasks assessed/performed       Past Medical History:  Diagnosis Date  . Arthritis    "back, hands" (07/22/2015)  . Cancer of left breast ([place]) 2006   S/P lumpectomy  . Chronic diastolic CHF (congestive heart failure) ([place]) 07/10/2017  . Complication of anesthesia    "brief breathing problem at surgery center in ~ 2005 when I had gallbladder OR"  . Concussion 03/2013   due to fall  . Depression   . Diverticulosis   . DVT (deep venous thrombosis) ([place]) 03/2013   LLE  . GERD (gastroesophageal reflux disease)   . Hyperlipidemia   . Hypertension   . Hypothyroidism   . OSA treated with BiPAP   . Pulmonary embolism ([place]) 03/2013  . Seizures ([place])   . Sleep apnea   . Thyroid disease    Hypothyroid  . Type II diabetes mellitus ([place])     Past Surgical History:  Procedure Laterality Date  . ABDOMINAL HYSTERECTOMY  1970s  . BREAST BIOPSY Left 2006  . BREAST LUMPECTOMY Left 2006  . BUNIONECTOMY WITH HAMMERTOE RECONSTRUCTION Left   . ESOPHAGOGASTRODUODENOSCOPY N/A 05/29/2017   Procedure: ESOPHAGOGASTRODUODENOSCOPY (EGD);  Surgeon: Irene Shipper, MD;  Location: [facility];  Service: Endoscopy;  Laterality: N/A;  . JOINT REPLACEMENT    . LAPAROSCOPIC CHOLECYSTECTOMY  ~ 2005  . TOTAL HIP ARTHROPLASTY Left 2010  . TOTAL KNEE ARTHROPLASTY  Bilateral 2005-2006    There were no vitals filed for this visit.  Subjective Assessment - 12/24/19 0845    Subjective  Patient has not been to see [place] in two weeks, she has had a visit to the ED and other MD's, she had a UTI, she has been referred to cariologist    Currently in Pain?  No/denies                        [facility] Adult PT Treatment/Exercise - 12/24/19 0001      High Level Balance   High Level Balance Activities  Side stepping;Backward walking;Tandem walking;Marching forwards;Direction changes;Sudden stops    High Level Balance Comments  ball toss ball kicks      Lumbar Exercises: Aerobic   UBE (Upper Arm Bike)  level 3 x 4 minutes      Lumbar Exercises: Machines for Strengthening   Cybex Knee Extension  10# 3x10    Cybex Knee Flexion  25# 3x10    Leg Press  20# 3x10    Other Lumbar Machine Exercise  seated row 20# lats 20# 2x10               PT Short Term Goals - 12/04/19 1627      PT SHORT TERM  GOAL #1   Title  independent with intial HEP    Status  Achieved        PT Long Term Goals - 12/24/19 0935      PT LONG TERM GOAL #1   Title  increase overall hip LE strength for functional gait and safety hips and ankles to 4+/5    Status  On-going      PT LONG TERM GOAL #2   Title  decrease TUG time to less than 14 seconds for functional gait and safety    Status  On-going      PT LONG TERM GOAL #3   Title  increase BERG to 50/56 for safety and independence    Status  On-going      PT LONG TERM GOAL #4   Title  decrease LBP 50% with activities    Status  Partially Met            Plan - 12/24/19 0922    Clinical Impression Statement  Patient was out the past two weeks, she had a UTI and went to the ED, she also was able to establish medical care here in town, when I first saw her I was very concerned about her O2 saturation, she has now been referred to cariologist.  Today after some exercises the O2 saturation would drop to  77%, with pursed lipped breathing we could get back up to 88%.  She had LOB with ball kicks and direction changes    PT Next Visit Plan  continue to work on strength, function and endurance    Consulted and Agree with Plan of Care  Patient       Patient will benefit from skilled therapeutic intervention in order to improve the following deficits and impairments:  Abnormal gait, Decreased range of motion, Difficulty walking, Increased muscle spasms, Decreased activity tolerance, Pain, Decreased balance, Impaired flexibility, Improper body mechanics, Postural dysfunction, Decreased strength, Decreased mobility  Visit Diagnosis: Acute bilateral low back pain without sciatica  Other abnormalities of gait and mobility  Muscle spasm of back  Muscle weakness (generalized)  Difficulty in walking, not elsewhere classified  Risk for falls  Physical debility     Problem List Patient Active Problem List   Diagnosis Date Noted  . Acute cystitis with hematuria 12/19/2019  . Hypomagnesemia 07/11/2017  . Nausea vomiting and diarrhea 07/10/2017  . Hypokalemia 07/10/2017  . Chronic diastolic CHF (congestive heart failure) ([place]) 07/10/2017  . Seizure ([place]) 07/10/2017  . OSA (obstructive sleep apnea) 07/10/2017  . Sepsis ([place]) 07/10/2017  . Seizure disorder ([place]) 07/05/2017  . Pulmonary HTN ([place]) 07/05/2017  . Anxiety state   . Physical debility 06/23/2017  . 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 History of acute respiratory failure   . Tracheostomy, acute management ([place])   . Aspiration pneumonia ([place])   . Encephalopathy   . Status epilepticus ([place])   . Benign essential HTN   . 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 History of pulmonary embolism   . Acute blood loss anemia   . Diverticulitis of colon   . Tachypnea   . Encounter for orogastric (OG) tube placement   . Encounter for central line placement   . Encounter for attention to tracheostomy ([place])   . Acute  respiratory failure with hypoxia ([place])   . Acute encephalopathy 06/06/2017  . Diverticulitis of large intestine without perforation or abscess without bleeding   . Abdominal pain   . Non-intractable cyclical  vomiting with nausea   . Abnormal CT of the abdomen   . Hypocalcemia 05/27/2017  . 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 Colitis 05/27/2017  . Central retinal vein occlusion of right eye 04/20/2016  . Nuclear sclerotic cataract of both eyes 04/20/2016  . Diabetic foot infection ([place]) 07/21/2015  . Diabetes mellitus type 2, controlled ([place]) 07/21/2015  . Cellulitis of foot, right 07/21/2015  . Cellulitis of right foot 07/21/2015  . Acute pulmonary embolism ([place]) 04/01/2013  . DVT (deep venous thrombosis) ([place]) 04/01/2013  . HX: breast cancer 04/01/2013  . Concussion 04/01/2013  . Diabetes ([place]) 04/01/2013  . Essential hypertension, benign 04/01/2013  . Hypothyroidism 04/01/2013  . OSA on CPAP 04/01/2013    [name] Boast., PT 12/24/2019, 9:36 AM  [place], [name], [address] Phone: [phone]   Fax:  [phone]  Name: Amanda Short MRN: [id number] Date of Birth: [birth year]

## 2019-12-29 ENCOUNTER — Other Ambulatory Visit: Payer: Self-pay

## 2019-12-29 ENCOUNTER — Ambulatory Visit: Payer: Medicare PPO | Admitting: Physical Therapy

## 2019-12-29 DIAGNOSIS — M62830 Muscle spasm of back: Secondary | ICD-10-CM

## 2019-12-29 DIAGNOSIS — M545 Low back pain, unspecified: Secondary | ICD-10-CM

## 2019-12-29 DIAGNOSIS — Z9181 History of falling: Secondary | ICD-10-CM

## 2019-12-29 DIAGNOSIS — R5381 Other malaise: Secondary | ICD-10-CM

## 2019-12-29 DIAGNOSIS — R262 Difficulty in walking, not elsewhere classified: Secondary | ICD-10-CM

## 2019-12-29 DIAGNOSIS — M6281 Muscle weakness (generalized): Secondary | ICD-10-CM

## 2019-12-29 DIAGNOSIS — R2689 Other abnormalities of gait and mobility: Secondary | ICD-10-CM

## 2019-12-29 NOTE — Therapy (Signed)
[place], [name], [address] Phone: [phone]   Fax:  [phone]  Physical Therapy Treatment  Patient Details  Name: Amanda Short MRN: [id number] Date of Birth: Nov 24, 1940 Referring Provider (PT): Rennard   Encounter Date: 12/29/2019   PT End of Session - 12/29/19 1615    Visit Number 7    Number of Visits 12    Date for PT Re-Evaluation 01/02/20    Authorization Type [employer]    PT Start Time 8032    PT Stop Time 1615    PT Time Calculation (min) 45 min    Activity Tolerance Patient limited by fatigue    Behavior During Therapy [facility] for tasks assessed/performed           Past Medical History:  Diagnosis Date  . Abdominal pain   . Abnormal CT of the abdomen   . Acute blood loss anemia   . Acute cystitis with hematuria 12/19/2019  . Acute encephalopathy 06/06/2017  . Acute pulmonary embolism ([place]) 04/01/2013  . Acute respiratory failure with hypoxia ([place])   . Anxiety state   . Arthritis    "back, hands" (07/22/2015)  . Aspiration pneumonia ([place])   . Benign essential HTN   . Cancer of left breast ([place]) 2006   S/P lumpectomy  . Cellulitis of foot, right 07/21/2015  . Cellulitis of right foot 07/21/2015  . Central retinal vein occlusion of right eye 04/20/2016  . Chronic diastolic CHF (congestive heart failure) ([place]) 07/10/2017  . Colitis 05/27/2017  . Complication of anesthesia    "brief breathing problem at surgery center in ~ 2005 when I had gallbladder OR"  . Concussion 03/2013   due to fall  . Depression   . Diabetes ([place]) 04/01/2013  . 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 Diabetes mellitus type 2, controlled ([place]) 07/21/2015  . Diabetic foot infection ([place]) 07/21/2015  . Diverticulitis of colon   . Diverticulitis of large intestine without perforation or abscess without bleeding   . Diverticulosis   . DVT (deep venous thrombosis) ([place]) 03/2013   LLE  . Encephalopathy    . Encounter for attention to tracheostomy ([place])   . Encounter for central line placement   . Encounter for orogastric (OG) tube placement   . Essential hypertension, benign 04/01/2013  . GERD (gastroesophageal reflux disease)   . History of acute respiratory failure   . History of pulmonary embolism   . HX: breast cancer 04/01/2013  . Hyperlipidemia   . Hypertension   . Hypocalcemia 05/27/2017  . Hypokalemia 07/10/2017  . Hypomagnesemia 07/11/2017  . Hypothyroidism   . Nausea vomiting and diarrhea 07/10/2017  . Non-intractable cyclical vomiting with nausea   . Nuclear sclerotic cataract of both eyes 04/20/2016  . OSA (obstructive sleep apnea) 07/10/2017  . OSA on CPAP 04/01/2013  . OSA treated with BiPAP   . Physical debility 06/23/2017  . 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 Pulmonary embolism ([place]) 03/2013  . Pulmonary HTN ([place]) 07/05/2017  . Seizure ([place]) 07/10/2017  . Seizure disorder ([place]) 07/05/2017  . Seizures ([place])   . Sepsis ([place]) 07/10/2017  . Sleep apnea   . Status epilepticus ([place])   . 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 Tachypnea   . Thyroid disease    Hypothyroid  . Tracheostomy, acute management ([place])   . Type II diabetes mellitus ([place])     Past Surgical History:  Procedure Laterality Date  .  ABDOMINAL HYSTERECTOMY  1970s  . BREAST BIOPSY Left 2006  . BREAST LUMPECTOMY Left 2006  . BUNIONECTOMY WITH HAMMERTOE RECONSTRUCTION Left   . ESOPHAGOGASTRODUODENOSCOPY N/A 05/29/2017   Procedure: ESOPHAGOGASTRODUODENOSCOPY (EGD);  Surgeon: Irene Shipper, MD;  Location: [facility];  Service: Endoscopy;  Laterality: N/A;  . JOINT REPLACEMENT    . LAPAROSCOPIC CHOLECYSTECTOMY  ~ 2005  . TOTAL HIP ARTHROPLASTY Left 2010  . TOTAL KNEE ARTHROPLASTY Bilateral 2005-2006    There were no vitals filed for this visit.   Subjective Assessment - 12/29/19 1527    Subjective Patient reports that she will be seeing a cardiologist on Wednesday.  Reports feeling about the  same    Currently in Pain? No/denies                             [facility] Adult PT Treatment/Exercise - 12/29/19 0001      Ambulation/Gait   Gait Comments walking 2 minutes 230 feet, O2 dropped to 76%, took 3 minutes and cues for breathing to get back to 85%      High Level Balance   High Level Balance Activities Side stepping;Backward walking;Tandem walking;Marching forwards;Direction changes;Sudden stops      Lumbar Exercises: Stretches   Passive Hamstring Stretch Right;Left;4 reps;20 seconds    Piriformis Stretch Right;Left;4 reps;20 seconds      Lumbar Exercises: Aerobic   Nustep level 5 x 5 minutes      Lumbar Exercises: Machines for Strengthening   Cybex Knee Extension 10# 3x10    Cybex Knee Flexion 25# 3x10    Leg Press 20# 3x10      Lumbar Exercises: Supine   Other Supine Lumbar Exercises feet on ball K2C, trunk rotation, small bridges and isometric abs                    PT Short Term Goals - 12/04/19 1627      PT SHORT TERM GOAL #1   Title independent with intial HEP    Status Achieved             PT Long Term Goals - 12/24/19 0935      PT LONG TERM GOAL #1   Title increase overall hip LE strength for functional gait and safety hips and ankles to 4+/5    Status On-going      PT LONG TERM GOAL #2   Title decrease TUG time to less than 14 seconds for functional gait and safety    Status On-going      PT LONG TERM GOAL #3   Title increase BERG to 50/56 for safety and independence    Status On-going      PT LONG TERM GOAL #4   Title decrease LBP 50% with activities    Status Partially Met                 Plan - 12/29/19 1616    Clinical Impression Statement Patient really seems to have oxygen saturation level issues, when she came in her O2 saturation was 85%.  It really never got much above this and at its lowest after walking 230' it was to 75%.  She has some issues with obstacle courses stepping over objects she  would catch her feet requiring cues to pick up feet and Mod A to right LOB    PT Next Visit Plan see if there is anything new from the consult with the cardiologist  Consulted and Agree with Plan of Care Patient           Patient will benefit from skilled therapeutic intervention in order to improve the following deficits and impairments:  Abnormal gait, Decreased range of motion, Difficulty walking, Increased muscle spasms, Decreased activity tolerance, Pain, Decreased balance, Impaired flexibility, Improper body mechanics, Postural dysfunction, Decreased strength, Decreased mobility  Visit Diagnosis: Acute bilateral low back pain without sciatica  Other abnormalities of gait and mobility  Muscle spasm of back  Muscle weakness (generalized)  Difficulty in walking, not elsewhere classified  Risk for falls  Physical debility     Problem List Patient Active Problem List   Diagnosis Date Noted  . Acute cystitis with hematuria 12/19/2019  . Hypomagnesemia 07/11/2017  . Nausea vomiting and diarrhea 07/10/2017  . Hypokalemia 07/10/2017  . Chronic diastolic CHF (congestive heart failure) ([place]) 07/10/2017  . Seizure ([place]) 07/10/2017  . OSA (obstructive sleep apnea) 07/10/2017  . Sepsis ([place]) 07/10/2017  . Seizure disorder ([place]) 07/05/2017  . Pulmonary HTN ([place]) 07/05/2017  . Anxiety state   . Physical debility 06/23/2017  . 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 History of acute respiratory failure   . Tracheostomy, acute management ([place])   . Aspiration pneumonia ([place])   . Encephalopathy   . Status epilepticus ([place])   . Benign essential HTN   . 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 History of pulmonary embolism   . Acute blood loss anemia   . Diverticulitis of colon   . Tachypnea   . Encounter for orogastric (OG) tube placement   . Encounter for central line placement   . Encounter for attention to tracheostomy ([place])   . Acute respiratory failure with  hypoxia ([place])   . Acute encephalopathy 06/06/2017  . Diverticulitis of large intestine without perforation or abscess without bleeding   . Abdominal pain   . Non-intractable cyclical vomiting with nausea   . Abnormal CT of the abdomen   . Hypocalcemia 05/27/2017  . 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 Colitis 05/27/2017  . Central retinal vein occlusion of right eye 04/20/2016  . Nuclear sclerotic cataract of both eyes 04/20/2016  . Diabetic foot infection ([place]) 07/21/2015  . Diabetes mellitus type 2, controlled ([place]) 07/21/2015  . Cellulitis of foot, right 07/21/2015  . Cellulitis of right foot 07/21/2015  . Acute pulmonary embolism ([place]) 04/01/2013  . DVT (deep venous thrombosis) ([place]) 04/01/2013  . HX: breast cancer 04/01/2013  . Concussion 04/01/2013  . Diabetes ([place]) 04/01/2013  . Essential hypertension, benign 04/01/2013  . Hypothyroidism 04/01/2013  . OSA on CPAP 04/01/2013    [name] Boast., PT 12/29/2019, 4:19 PM  [place], [name], [address] Phone: [phone]   Fax:  [phone]  Name: Amanda Short MRN: [id number] Date of Birth: 01/18/1941

## 2019-12-31 ENCOUNTER — Other Ambulatory Visit: Payer: Self-pay

## 2019-12-31 ENCOUNTER — Ambulatory Visit: Payer: Medicare PPO | Admitting: Cardiology

## 2019-12-31 ENCOUNTER — Encounter: Payer: Self-pay | Admitting: Cardiology

## 2019-12-31 VITALS — BP 112/72 | HR 64 | Ht 61.0 in | Wt 156.0 lb

## 2019-12-31 DIAGNOSIS — I5032 Chronic diastolic (congestive) heart failure: Secondary | ICD-10-CM | POA: Diagnosis not present

## 2019-12-31 DIAGNOSIS — E1122 Type 2 diabetes mellitus with diabetic chronic kidney disease: Secondary | ICD-10-CM

## 2019-12-31 DIAGNOSIS — I10 Essential (primary) hypertension: Secondary | ICD-10-CM | POA: Diagnosis not present

## 2019-12-31 DIAGNOSIS — R002 Palpitations: Secondary | ICD-10-CM | POA: Diagnosis not present

## 2019-12-31 DIAGNOSIS — Z9989 Dependence on other enabling machines and devices: Secondary | ICD-10-CM

## 2019-12-31 DIAGNOSIS — G4733 Obstructive sleep apnea (adult) (pediatric): Secondary | ICD-10-CM

## 2019-12-31 DIAGNOSIS — N182 Chronic kidney disease, stage 2 (mild): Secondary | ICD-10-CM

## 2019-12-31 DIAGNOSIS — I272 Pulmonary hypertension, unspecified: Secondary | ICD-10-CM | POA: Diagnosis not present

## 2019-12-31 NOTE — Patient Instructions (Signed)
Medication Instructions:  Your physician recommends that you continue on your current medications as directed. Please refer to the Current Medication list given to you today.  *If you need a refill on your cardiac medications before your next appointment, please call your pharmacy*   Lab Work: None.  If you have labs (blood work) drawn today and your tests are completely normal, you will receive your results only by:  [place] (if you have MyChart) OR  A paper copy in the mail If you have any lab test that is abnormal or we need to change your treatment, we will call you to review the results.   Testing/Procedures: Your physician has requested that you have an echocardiogram. Echocardiography is a painless test that uses sound waves to create images of your heart. It provides your doctor with information about the size and shape of your heart and how well your hearts chambers and valves are working. This procedure takes approximately one hour. There are no restrictions for this procedure.  A zio monitor was ordered today. It will remain on for 7 days. You will then return monitor and event diary in provided box. It takes 1-2 weeks for report to be downloaded and returned to [place]. We will call you with the results. If monitor falls off or has orange flashing light, please call Zio for further instructions.      Follow-Up: At [facility], you and your health needs are our priority.  As part of our continuing mission to provide you with exceptional heart care, we have created designated Provider Care Teams.  These Care Teams include your primary Cardiologist (physician) and Advanced Practice Providers (APPs -  Physician Assistants and Nurse Practitioners) who all work together to provide you with the care you need, when you need it.  We recommend signing up for the patient portal called "MyChart".  Sign up information is provided on this After Visit Summary.  MyChart is used to  connect with patients for Virtual Visits (Telemedicine).  Patients are able to view lab/test results, encounter notes, upcoming appointments, etc.  Non-urgent messages can be sent to your provider as well.   To learn more about what you can do with MyChart, go to [email or web address].    Your next appointment:   1 month(s)  The format for your next appointment:   In Person  Provider:   Jenne Campus, MD   Other Instructions   Echocardiogram An echocardiogram is a procedure that uses painless sound waves (ultrasound) to produce an image of the heart. Images from an echocardiogram can provide important information about:  Signs of coronary artery disease (CAD).  Aneurysm detection. An aneurysm is a weak or damaged part of an artery wall that bulges out from the normal force of blood pumping through the body.  Heart size and shape. Changes in the size or shape of the heart can be associated with certain conditions, including heart failure, aneurysm, and CAD.  Heart muscle function.  Heart valve function.  Signs of a past heart attack.  Fluid buildup around the heart.  Thickening of the heart muscle.  A tumor or infectious growth around the heart valves. Tell a health care provider about:  Any allergies you have.  All medicines you are taking, including vitamins, herbs, eye drops, creams, and over-the-counter medicines.  Any blood disorders you have.  Any surgeries you have had.  Any medical conditions you have.  Whether you are pregnant or may be pregnant. What are the risks?  Generally, this is a safe procedure. However, problems may occur, including:  Allergic reaction to dye (contrast) that may be used during the procedure. What happens before the procedure? No specific preparation is needed. You may eat and drink normally. What happens during the procedure?   An IV tube may be inserted into one of your veins.  You may receive contrast through this  tube. A contrast is an injection that improves the quality of the pictures from your heart.  A gel will be applied to your chest.  A wand-like tool (transducer) will be moved over your chest. The gel will help to transmit the sound waves from the transducer.  The sound waves will harmlessly bounce off of your heart to allow the heart images to be captured in real-time motion. The images will be recorded on a computer. The procedure may vary among health care providers and hospitals. What happens after the procedure?  You may return to your normal, everyday life, including diet, activities, and medicines, unless your health care provider tells you not to do that. Summary  An echocardiogram is a procedure that uses painless sound waves (ultrasound) to produce an image of the heart.  Images from an echocardiogram can provide important information about the size and shape of your heart, heart muscle function, heart valve function, and fluid buildup around your heart.  You do not need to do anything to prepare before this procedure. You may eat and drink normally.  After the echocardiogram is completed, you may return to your normal, everyday life, unless your health care provider tells you not to do that. This information is not intended to replace advice given to you by your health care provider. Make sure you discuss any questions you have with your health care provider. Document Revised: 10/24/2018 Document Reviewed: 08/05/2016 Elsevier Patient Education  [employer].

## 2019-12-31 NOTE — Progress Notes (Signed)
Cardiology Consultation:    Date:  12/31/2019   ID:  Amanda Short, DOB [birth year], MRN [id number]  PCP:  Libby Maw, MD  Cardiologist:  Jenne Campus, MD   Referring MD: Libby Maw,*   Chief Complaint  Patient presents with  . New Patient (Initial Visit)  I have shortness of breath  History of Present Illness:    Amanda Short is a 79 y.o. female who is being seen today for the evaluation of shortness of breath at the request of Libby Maw,*.  Past medical history significant for DVT with pulmonary emboli that she suffered from years ago, she was also diagnosed with pulmonary hypertension.  She was offered to follow-up with pulmonologist for potential treatment however I did not happen.  At that time she lived in [place].  She was referred to [place] because of progressive shortness of breath.  This is a gradual process going on over the last few years.  She does not have swelling of lower extremities she does not have any proximal nocturnal dyspnea.  She also described to have some palpitations when she leaned back that is the fact that she can feel her heart speeding up that happen without any warning typically at evening time last for a few seconds to maybe a minute.  She does not have any dizziness associated with this sensation.  She described also have some unsteadiness she described 1 episode of possible syncope happened a while ago when she ended up having concussion of her head.  When she walks she get a little bit unsteady but there is no falls recently. Quit smoking in 1990s Does not have family history of premature coronary artery disease  Past Medical History:  Diagnosis Date  . Abdominal pain   . Abnormal CT of the abdomen   . Acute blood loss anemia   . Acute cystitis with hematuria 12/19/2019  . Acute encephalopathy 06/06/2017  . Acute pulmonary embolism ([place]) 04/01/2013  . Acute respiratory failure with hypoxia ([place])   .  Anxiety state   . Arthritis    "back, hands" (07/22/2015)  . Aspiration pneumonia ([place])   . Benign essential HTN   . Cancer of left breast ([place]) 2006   S/P lumpectomy  . Cellulitis of foot, right 07/21/2015  . Cellulitis of right foot 07/21/2015  . Central retinal vein occlusion of right eye 04/20/2016  . Chronic diastolic CHF (congestive heart failure) ([place]) 07/10/2017  . Colitis 05/27/2017  . Complication of anesthesia    "brief breathing problem at surgery center in ~ 2005 when I had gallbladder OR"  . Concussion 03/2013   due to fall  . Depression   . Diabetes ([place]) 04/01/2013  . 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 Diabetes mellitus type 2, controlled ([place]) 07/21/2015  . Diabetic foot infection ([place]) 07/21/2015  . Diverticulitis of colon   . Diverticulitis of large intestine without perforation or abscess without bleeding   . Diverticulosis   . DVT (deep venous thrombosis) ([place]) 03/2013   LLE  . Encephalopathy   . Encounter for attention to tracheostomy ([place])   . Encounter for central line placement   . Encounter for orogastric (OG) tube placement   . Essential hypertension, benign 04/01/2013  . GERD (gastroesophageal reflux disease)   . History of acute respiratory failure   . History of pulmonary embolism   . HX: breast cancer 04/01/2013  . Hyperlipidemia   . Hypertension   . Hypocalcemia 05/27/2017  .  Hypokalemia 07/10/2017  . Hypomagnesemia 07/11/2017  . Hypothyroidism   . Nausea vomiting and diarrhea 07/10/2017  . Non-intractable cyclical vomiting with nausea   . Nuclear sclerotic cataract of both eyes 04/20/2016  . OSA (obstructive sleep apnea) 07/10/2017  . OSA on CPAP 04/01/2013  . OSA treated with BiPAP   . Physical debility 06/23/2017  . 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 Pulmonary embolism ([place]) 03/2013  . Pulmonary HTN ([place]) 07/05/2017  . Seizure ([place]) 07/10/2017  . Seizure disorder ([place]) 07/05/2017  . Seizures ([place])   . Sepsis ([place]) 07/10/2017  . Sleep  apnea   . Status epilepticus ([place])   . 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 Tachypnea   . Thyroid disease    Hypothyroid  . Tracheostomy, acute management ([place])   . Type II diabetes mellitus ([place])     Past Surgical History:  Procedure Laterality Date  . ABDOMINAL HYSTERECTOMY  1970s  . BREAST BIOPSY Left 2006  . BREAST LUMPECTOMY Left 2006  . BUNIONECTOMY WITH HAMMERTOE RECONSTRUCTION Left   . ESOPHAGOGASTRODUODENOSCOPY N/A 05/29/2017   Procedure: ESOPHAGOGASTRODUODENOSCOPY (EGD);  Surgeon: Irene Shipper, MD;  Location: [facility];  Service: Endoscopy;  Laterality: N/A;  . JOINT REPLACEMENT    . LAPAROSCOPIC CHOLECYSTECTOMY  ~ 2005  . TOTAL HIP ARTHROPLASTY Left 2010  . TOTAL KNEE ARTHROPLASTY Bilateral 2005-2006    Current Medications: Current Meds  Medication Sig  . acetaminophen (TYLENOL) 500 MG tablet Take 500 mg by mouth every 6 (six) hours as needed for mild pain.  Marland Kitchen ALPRAZolam (XANAX) 0.5 MG tablet Take 0.5 mg by mouth at bedtime as needed for anxiety.  Marland Kitchen atorvastatin (LIPITOR) 20 MG tablet Take 20 mg by mouth daily.  . BD PEN NEEDLE NANO 2ND GEN 32G X 4 MM MISC daily.  Marland Kitchen diltiazem (CARDIZEM) 30 MG tablet Take 30 mg by mouth daily.  . DULoxetine (CYMBALTA) 60 MG capsule Take 60 mg by mouth daily.  . febuxostat (ULORIC) 40 MG tablet Take 40 mg daily by mouth.  . furosemide (LASIX) 40 MG tablet Take 20 mg by mouth daily.  . insulin degludec (TRESIBA FLEXTOUCH) 100 UNIT/ML SOPN FlexTouch Pen   . levETIRAcetam (KEPPRA) 500 MG tablet Take 500 mg by mouth 2 (two) times daily.  Marland Kitchen levothyroxine (SYNTHROID, LEVOTHROID) 100 MCG tablet Take 100 mcg by mouth daily before breakfast.  . metoprolol (TOPROL-XL) 200 MG 24 hr tablet Take 100 mg by mouth at bedtime.   . montelukast (SINGULAIR) 10 MG tablet Take 10 mg at bedtime by mouth.  . Multiple Vitamin (MULTIVITAMIN WITH MINERALS) TABS tablet Take 1 tablet daily by mouth.  . nateglinide (STARLIX) 120 MG tablet Take 120 mg by  mouth 3 (three) times daily with meals.   Marland Kitchen omeprazole (PRILOSEC) 40 MG capsule Take 40 mg by mouth 2 (two) times daily.  Marland Kitchen OZEMPIC, 0.25 OR 0.5 MG/DOSE, 2 MG/1.5ML SOPN Inject 0.5 mg into the skin once a week.  . Rivaroxaban (XARELTO) 15 MG TABS tablet Take 15 mg by mouth daily.  Marland Kitchen spironolactone (ALDACTONE) 25 MG tablet Take 25 mg by mouth 2 (two) times daily.     Allergies:   Allopurinol   Social History   Socioeconomic History  . Marital status: Married    Spouse name: Not on file  . Number of children: 1  . Years of education: Not on file  . Highest education level: Not on file  Occupational History  . Occupation: retired  Tobacco Use  . Smoking status:  Former Smoker    Packs/day: 1.00    Years: 30.00    Pack years: 30.00    Types: Cigarettes    Quit date: 1995    Years since quitting: 26.4  . Smokeless tobacco: Never Used  . Tobacco comment: "quit smoking cigarettes in the 1990s"  Vaping Use  . Vaping Use: Never used  Substance and Sexual Activity  . Alcohol use: No  . Drug use: No  . Sexual activity: Not Currently  Other Topics Concern  . Not on file  Social History Narrative  . Not on file   Social Determinants of Health   Financial Resource Strain:   . Difficulty of Paying Living Expenses:   Food Insecurity:   . Worried About Charity fundraiser in the Last Year:   . Arboriculturist in the Last Year:   Transportation Needs:   . Film/video editor (Medical):   Marland Kitchen Lack of Transportation (Non-Medical):   Physical Activity:   . Days of Exercise per Week:   . Minutes of Exercise per Session:   Stress:   . Feeling of Stress :   Social Connections:   . Frequency of Communication with Friends and Family:   . Frequency of Social Gatherings with Friends and Family:   . Attends Religious Services:   . Active Member of Clubs or Organizations:   . Attends Archivist Meetings:   Marland Kitchen Marital Status:      Family History: The patient's family  history includes Breast cancer in her cousin and sister; Colon cancer in her maternal grandmother; Diabetes in her sister and another family member; Hypertension in her mother; Kidney disease in her sister; Liver cancer in her mother; Uterine cancer in her mother. ROS:   Please see the history of present illness.    All 14 point review of systems negative except as described per history of present illness.  EKGs/Labs/Other Studies Reviewed:    The following studies were reviewed today:   EKG:  EKG is  ordered today.  The ekg ordered today demonstrates normal sinus rhythm possible left atrial enlargement, indeterminate axis, nonspecific ST segment changes  Recent Labs: 09/05/2019: BUN 28; Creatinine, Ser 1.38; Potassium 4.6; Sodium 140  Recent Lipid Panel    Component Value Date/Time   CHOL 131 07/11/2017 0347   TRIG 144 07/11/2017 0347   HDL 46 07/11/2017 0347   CHOLHDL 2.8 07/11/2017 0347   VLDL 29 07/11/2017 0347   LDLCALC 56 07/11/2017 0347    Physical Exam:    VS:  BP 112/72   Pulse 64   Ht _0  (1.549 m)   Wt 156 lb (70.8 kg)   SpO2 (!) 89%   BMI 29.48 kg/m     Wt Readings from Last 3 Encounters:  12/31/19 156 lb (70.8 kg)  12/19/19 157 lb 9.6 oz (71.5 kg)  08/27/19 161 lb 8 oz (73.3 kg)     GEN:  Well nourished, well developed in no acute distress HEENT: Normal NECK: No JVD; No carotid bruits LYMPHATICS: No lymphadenopathy CARDIAC: RRR, S2 is loud no murmurs, no rubs, no gallops RESPIRATORY:  Clear to auscultation without rales, wheezing or rhonchi  ABDOMEN: Soft, non-tender, non-distended MUSCULOSKELETAL:  No edema; No deformity  SKIN: Warm and dry NEUROLOGIC:  Alert and oriented x 3 PSYCHIATRIC:  Normal affect   ASSESSMENT:    1. Palpitations   2. Pulmonary hypertension, unspecified ([place])   3. Pulmonary HTN ([place])   4. Essential hypertension,  benign   5. Chronic diastolic CHF (congestive heart failure) ([place])   6. OSA on CPAP   7. Controlled type 2  diabetes mellitus with stage 2 chronic kidney disease, without long-term current use of insulin (HCC)    PLAN:    In order of problems listed above:  1. Palpitations: I will ask you to wear Zio patch to see exactly what I, dealing with. 2. Pulmonary hypertension.  There was an echocardiogram D shape of the LV of the left ventricle with pulmonary pressure in the -50 mmHg.  Obviously there is very concerning diagnosis.  She did have apparently cardiac catheterization done and she is supposed to see somebody to talk about pulmonary hypertension but it never happened.  Apparently at that time her primary care physician told her that she does not need it.  Again that was in [place].  As potential etiology of her pulmonary hypertension could be sleep apnea as well as history of PE. 3. Essential hypertension: Blood pressure appears to be well controlled continue present management. 4. Chronic diastolic congestive heart failure.  Again echocardiogram will be done to reassess 5. Obstructive sleep apnea with CPAP management: She will require it most likely referral to sleep specialist. 6. Type 2 diabetes: Stable   Medication Adjustments/Labs and Tests Ordered: Current medicines are reviewed at length with the patient today.  Concerns regarding medicines are outlined above.  Orders Placed This Encounter  Procedures  . LONG TERM MONITOR (3-14 DAYS)  . EKG 12-Lead  . ECHOCARDIOGRAM COMPLETE   No orders of the defined types were placed in this encounter.   Signed, Park Liter, MD, [facility]. 12/31/2019 3:59 PM    [place]

## 2020-01-01 ENCOUNTER — Ambulatory Visit: Payer: Medicare PPO | Admitting: Physical Therapy

## 2020-01-01 ENCOUNTER — Encounter: Payer: Self-pay | Admitting: Physical Therapy

## 2020-01-01 DIAGNOSIS — R262 Difficulty in walking, not elsewhere classified: Secondary | ICD-10-CM

## 2020-01-01 DIAGNOSIS — M545 Low back pain, unspecified: Secondary | ICD-10-CM

## 2020-01-01 DIAGNOSIS — M6281 Muscle weakness (generalized): Secondary | ICD-10-CM

## 2020-01-01 DIAGNOSIS — R2689 Other abnormalities of gait and mobility: Secondary | ICD-10-CM

## 2020-01-01 DIAGNOSIS — M62830 Muscle spasm of back: Secondary | ICD-10-CM

## 2020-01-01 DIAGNOSIS — R5381 Other malaise: Secondary | ICD-10-CM

## 2020-01-01 DIAGNOSIS — Z9181 History of falling: Secondary | ICD-10-CM

## 2020-01-01 NOTE — Therapy (Signed)
[facility] [place], [name], [address] Phone: [phone]   Fax:  [phone]  Physical Therapy Treatment  Patient Details  Name: Amanda Short MRN: [id number] Date of Birth: Oct 31, 1940 Referring Provider (PT): Rennard   Encounter Date: 01/01/2020   PT End of Session - 01/01/20 1343    Visit Number 8    Number of Visits 12    Date for PT Re-Evaluation 01/02/20    Authorization Type [employer]    PT Start Time 1305    PT Stop Time 1352    PT Time Calculation (min) 47 min    Activity Tolerance Patient limited by fatigue    Behavior During Therapy [facility] for tasks assessed/performed           Past Medical History:  Diagnosis Date  . Abdominal pain   . Abnormal CT of the abdomen   . Acute blood loss anemia   . Acute cystitis with hematuria 12/19/2019  . Acute encephalopathy 06/06/2017  . Acute pulmonary embolism ([place]) 04/01/2013  . Acute respiratory failure with hypoxia ([place])   . Anxiety state   . Arthritis    "back, hands" (07/22/2015)  . Aspiration pneumonia ([place])   . Benign essential HTN   . Cancer of left breast ([place]) 2006   S/P lumpectomy  . Cellulitis of foot, right 07/21/2015  . Cellulitis of right foot 07/21/2015  . Central retinal vein occlusion of right eye 04/20/2016  . Chronic diastolic CHF (congestive heart failure) ([place]) 07/10/2017  . Colitis 05/27/2017  . Complication of anesthesia    "brief breathing problem at surgery center in ~ 2005 when I had gallbladder OR"  . Concussion 03/2013   due to fall  . Depression   . Diabetes ([place]) 04/01/2013  . 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 Diabetes mellitus type 2, controlled ([place]) 07/21/2015  . Diabetic foot infection ([place]) 07/21/2015  . Diverticulitis of colon   . Diverticulitis of large intestine without perforation or abscess without bleeding   . Diverticulosis   . DVT (deep venous thrombosis) ([place]) 03/2013   LLE  . Encephalopathy    . Encounter for attention to tracheostomy ([place])   . Encounter for central line placement   . Encounter for orogastric (OG) tube placement   . Essential hypertension, benign 04/01/2013  . GERD (gastroesophageal reflux disease)   . History of acute respiratory failure   . History of pulmonary embolism   . HX: breast cancer 04/01/2013  . Hyperlipidemia   . Hypertension   . Hypocalcemia 05/27/2017  . Hypokalemia 07/10/2017  . Hypomagnesemia 07/11/2017  . Hypothyroidism   . Nausea vomiting and diarrhea 07/10/2017  . Non-intractable cyclical vomiting with nausea   . Nuclear sclerotic cataract of both eyes 04/20/2016  . OSA (obstructive sleep apnea) 07/10/2017  . OSA on CPAP 04/01/2013  . OSA treated with BiPAP   . Physical debility 06/23/2017  . 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 Pulmonary embolism ([place]) 03/2013  . Pulmonary HTN ([place]) 07/05/2017  . Seizure ([place]) 07/10/2017  . Seizure disorder ([place]) 07/05/2017  . Seizures ([place])   . Sepsis ([place]) 07/10/2017  . Sleep apnea   . Status epilepticus ([place])   . 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 Tachypnea   . Thyroid disease    Hypothyroid  . Tracheostomy, acute management ([place])   . Type II diabetes mellitus ([place])     Past Surgical History:  Procedure Laterality Date  .  ABDOMINAL HYSTERECTOMY  1970s  . BREAST BIOPSY Left 2006  . BREAST LUMPECTOMY Left 2006  . BUNIONECTOMY WITH HAMMERTOE RECONSTRUCTION Left   . ESOPHAGOGASTRODUODENOSCOPY N/A 05/29/2017   Procedure: ESOPHAGOGASTRODUODENOSCOPY (EGD);  Surgeon: Irene Shipper, MD;  Location: [facility];  Service: Endoscopy;  Laterality: N/A;  . JOINT REPLACEMENT    . LAPAROSCOPIC CHOLECYSTECTOMY  ~ 2005  . TOTAL HIP ARTHROPLASTY Left 2010  . TOTAL KNEE ARTHROPLASTY Bilateral 2005-2006    There were no vitals filed for this visit.   Subjective Assessment - 01/01/20 1311    Subjective Saw cardiologist yesterday, she will be wearing a monitor for a few weeks and had the EKG  yesterday.  She will see him in 6 weeks after the monitor    Currently in Pain? No/denies                             [facility] Adult PT Treatment/Exercise - 01/01/20 0001      High Level Balance   High Level Balance Comments ball kicks, ball toss on the airex, walking ball toss      Lumbar Exercises: Aerobic   UBE (Upper Arm Bike) level 4 x 5 minutes    Recumbent Bike 4 minutes, then rest and then turned on the bike to level 0 she had to keep RPM above 45, could do this for 45 seconds, O2 saturation was 80%      Lumbar Exercises: Standing   Other Standing Lumbar Exercises 2.5# hip marching, abduction and extension 10x each leg                    PT Short Term Goals - 12/04/19 1627      PT SHORT TERM GOAL #1   Title independent with intial HEP    Status Achieved             PT Long Term Goals - 01/01/20 1351      PT LONG TERM GOAL #1   Title increase overall hip LE strength for functional gait and safety hips and ankles to 4+/5    Status On-going      PT LONG TERM GOAL #2   Title decrease TUG time to less than 14 seconds for functional gait and safety    Status On-going      PT LONG TERM GOAL #3   Title increase BERG to 50/56 for safety and independence    Status On-going      PT LONG TERM GOAL #4   Title decrease LBP 50% with activities    Status Partially Met      PT LONG TERM GOAL #5   Title increase lumbar ROM 25%    Status On-going                 Plan - 01/01/20 1343    Clinical Impression Statement Patient saw the cardiologist yesterday, she had an EKG that was "abnormal", she will be having monitoring in the near future.  The next MD apointment will be in August.  She really fatigued with the balance activities and walking, O2 dropped to 79% with instruction of pursed lipped breathing she was able to come back to 85% fairly quickly    PT Next Visit Plan will resubmit for insurance approval to continue.  She is starting the  process to see the cardiologist    Consulted and Agree with Plan of Care Patient  Patient will benefit from skilled therapeutic intervention in order to improve the following deficits and impairments:  Abnormal gait, Decreased range of motion, Difficulty walking, Increased muscle spasms, Decreased activity tolerance, Pain, Decreased balance, Impaired flexibility, Improper body mechanics, Postural dysfunction, Decreased strength, Decreased mobility  Visit Diagnosis: Acute bilateral low back pain without sciatica  Other abnormalities of gait and mobility  Muscle spasm of back  Muscle weakness (generalized)  Difficulty in walking, not elsewhere classified  Risk for falls  Physical debility     Problem List Patient Active Problem List   Diagnosis Date Noted  . Acute cystitis with hematuria 12/19/2019  . Hypomagnesemia 07/11/2017  . Nausea vomiting and diarrhea 07/10/2017  . Hypokalemia 07/10/2017  . Chronic diastolic CHF (congestive heart failure) ([place]) 07/10/2017  . Seizure ([place]) 07/10/2017  . OSA (obstructive sleep apnea) 07/10/2017  . Sepsis ([place]) 07/10/2017  . Seizure disorder ([place]) 07/05/2017  . Pulmonary HTN ([place]) 07/05/2017  . Anxiety state   . Physical debility 06/23/2017  . 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 History of acute respiratory failure   . Tracheostomy, acute management ([place])   . Aspiration pneumonia ([place])   . Encephalopathy   . Status epilepticus ([place])   . Benign essential HTN   . 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 History of pulmonary embolism   . Acute blood loss anemia   . Diverticulitis of colon   . Tachypnea   . Encounter for orogastric (OG) tube placement   . Encounter for central line placement   . Encounter for attention to tracheostomy ([place])   . Acute respiratory failure with hypoxia ([place])   . Acute encephalopathy 06/06/2017  . Diverticulitis of large intestine without perforation or abscess without bleeding    . Abdominal pain   . Non-intractable cyclical vomiting with nausea   . Abnormal CT of the abdomen   . Hypocalcemia 05/27/2017  . 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 Colitis 05/27/2017  . Central retinal vein occlusion of right eye 04/20/2016  . Nuclear sclerotic cataract of both eyes 04/20/2016  . Diabetic foot infection ([place]) 07/21/2015  . Diabetes mellitus type 2, controlled ([place]) 07/21/2015  . Cellulitis of foot, right 07/21/2015  . Cellulitis of right foot 07/21/2015  . Acute pulmonary embolism ([place]) 04/01/2013  . DVT (deep venous thrombosis) ([place]) 04/01/2013  . HX: breast cancer 04/01/2013  . Concussion 04/01/2013  . Diabetes ([place]) 04/01/2013  . Essential hypertension, benign 04/01/2013  . Hypothyroidism 04/01/2013  . OSA on CPAP 04/01/2013    [name] Boast., PT 01/01/2020, 1:52 PM  [place], [name], [address] Phone: [phone]   Fax:  [phone]  Name: Amanda Short MRN: [id number] Date of Birth: 10-28-1940

## 2020-01-05 ENCOUNTER — Encounter: Payer: Self-pay | Admitting: Physical Therapy

## 2020-01-05 ENCOUNTER — Other Ambulatory Visit: Payer: Self-pay

## 2020-01-05 ENCOUNTER — Ambulatory Visit: Payer: Medicare PPO | Admitting: Physical Therapy

## 2020-01-05 DIAGNOSIS — R2689 Other abnormalities of gait and mobility: Secondary | ICD-10-CM

## 2020-01-05 DIAGNOSIS — M62830 Muscle spasm of back: Secondary | ICD-10-CM

## 2020-01-05 DIAGNOSIS — M545 Low back pain, unspecified: Secondary | ICD-10-CM

## 2020-01-05 DIAGNOSIS — R5381 Other malaise: Secondary | ICD-10-CM

## 2020-01-05 DIAGNOSIS — M6281 Muscle weakness (generalized): Secondary | ICD-10-CM

## 2020-01-05 DIAGNOSIS — Z9181 History of falling: Secondary | ICD-10-CM

## 2020-01-05 DIAGNOSIS — R262 Difficulty in walking, not elsewhere classified: Secondary | ICD-10-CM

## 2020-01-05 NOTE — Therapy (Signed)
[facility] [place], [name], [address] Phone: [phone]   Fax:  [phone]  Physical Therapy Treatment  Patient Details  Name: Mattisen Pohlmann MRN: [id number] Date of Birth: 1940-11-02 Referring Provider (PT): Rennard   Encounter Date: 01/05/2020   PT End of Session - 01/05/20 1606    Visit Number 9    Number of Visits 20    Date for PT Re-Evaluation 02/16/20    PT Start Time 6962    PT Stop Time 1625    PT Time Calculation (min) 55 min    Activity Tolerance Patient limited by pain;Patient limited by fatigue    Behavior During Therapy [facility] for tasks assessed/performed           Past Medical History:  Diagnosis Date  . Abdominal pain   . Abnormal CT of the abdomen   . Acute blood loss anemia   . Acute cystitis with hematuria 12/19/2019  . Acute encephalopathy 06/06/2017  . Acute pulmonary embolism ([place]) 04/01/2013  . Acute respiratory failure with hypoxia ([place])   . Anxiety state   . Arthritis    "back, hands" (07/22/2015)  . Aspiration pneumonia ([place])   . Benign essential HTN   . Cancer of left breast (Ed[place]) 2006   S/P lumpectomy  . Cellulitis of foot, right 07/21/2015  . Cellulitis of right foot 07/21/2015  . Central retinal vein occlusion of right eye 04/20/2016  . Chronic diastolic CHF (congestive heart failure) (Al[place]) 07/10/2017  . Colitis 05/27/2017  . Complication of anesthesia    "brief breathing problem at surgery center in ~ 2005 when I had gallbladder OR"  . Concussion 03/2013   due to fall  . Depression   . Diabetes ([place]) 04/01/2013  . 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 Diabetes mellitus type 2, controlled ([place]) 07/21/2015  . Diabetic foot infection ([place]aval[place]) 07/21/2015  . Diverticulitis of colon   . Diverticulitis of large intestine without perforation or abscess without bleeding   . Diverticulosis   . DVT (deep venous thrombosis) ([place]) 03/2013   LLE  . Encephalopathy   .  Encounter for attention to tracheostomy ([place])   . Encounter for central line placement   . Encounter for orogastric (OG) tube placement   . Essential hypertension, benign 04/01/2013  . GERD (gastroesophageal reflux disease)   . History of acute respiratory failure   . History of pulmonary embolism   . HX: breast cancer 04/01/2013  . Hyperlipidemia   . Hypertension   . Hypocalcemia 05/27/2017  . Hypokalemia 07/10/2017  . Hypomagnesemia 07/11/2017  . Hypothyroidism   . Nausea vomiting and diarrhea 07/10/2017  . Non-intractable cyclical vomiting with nausea   . Nuclear sclerotic cataract of both eyes 04/20/2016  . OSA (obstructive sleep apnea) 07/10/2017  . OSA on CPAP 04/01/2013  . OSA treated with BiPAP   . Physical debility 06/23/2017  . 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 Pulmonary embolism ([place]) 03/2013  . Pulmonary HTN ([place]) 07/05/2017  . Seizure ([place]) 07/10/2017  . Seizure disorder ([place]) 07/05/2017  . Seizures ([place])   . Sepsis ([place]) 07/10/2017  . Sleep apnea   . Status epilepticus (Bertie)   . 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 Tachypnea   . Thyroid disease    Hypothyroid  . Tracheostomy, acute management ([place])   . Type II diabetes mellitus ([place])     Past Surgical History:  Procedure Laterality Date  . ABDOMINAL HYSTERECTOMY  1970s  . BREAST BIOPSY Left 2006  . BREAST LUMPECTOMY Left 2006  . BUNIONECTOMY WITH HAMMERTOE RECONSTRUCTION Left   . ESOPHAGOGASTRODUODENOSCOPY N/A 05/29/2017   Procedure: ESOPHAGOGASTRODUODENOSCOPY (EGD);  Surgeon: Irene Shipper, MD;  Location: [facility];  Service: Endoscopy;  Laterality: N/A;  . JOINT REPLACEMENT    . LAPAROSCOPIC CHOLECYSTECTOMY  ~ 2005  . TOTAL HIP ARTHROPLASTY Left 2010  . TOTAL KNEE ARTHROPLASTY Bilateral 2005-2006    There were no vitals filed for this visit.   Subjective Assessment - 01/05/20 1536    Subjective Patient reports that going up and down the steps and doing some laundry increased her low back and  buttock pain.    Currently in Pain? Yes    Pain Score 6     Pain Location Back    Pain Orientation Right;Lower    Pain Descriptors / Indicators Sore;Aching    Aggravating Factors  stairs and laundry                             [facility] Adult PT Treatment/Exercise - 01/05/20 0001      Ambulation/Gait   Gait Comments gait 140 feet and then had to rest, O2 to 79%      High Level Balance   High Level Balance Activities Side stepping;Negotiating over obstacles      Lumbar Exercises: Stretches   Passive Hamstring Stretch Right;Left;4 reps;20 seconds      Lumbar Exercises: Aerobic   Nustep level 5 x 5 minutes      Lumbar Exercises: Seated   Other Seated Lumbar Exercises red tband scapular rows and shoulder extension x 15 each    Other Seated Lumbar Exercises bosu behind partial crunch      Lumbar Exercises: Supine   Other Supine Lumbar Exercises feet on ball K2C, trunk rotation, small bridges and isometric abs      Modalities   Modalities Electrical Stimulation;Moist Heat      Moist Heat Therapy   Number Minutes Moist Heat 13 Minutes    Moist Heat Location Lumbar Spine      Electrical Stimulation   Electrical Stimulation Location right lumbar area    Electrical Stimulation Action IFC    Electrical Stimulation Parameters supine    Electrical Stimulation Goals Pain                    PT Short Term Goals - 12/04/19 1627      PT SHORT TERM GOAL #1   Title independent with intial HEP    Status Achieved             PT Long Term Goals - 01/01/20 1351      PT LONG TERM GOAL #1   Title increase overall hip LE strength for functional gait and safety hips and ankles to 4+/5    Status On-going      PT LONG TERM GOAL #2   Title decrease TUG time to less than 14 seconds for functional gait and safety    Status On-going      PT LONG TERM GOAL #3   Title increase BERG to 50/56 for safety and independence    Status On-going      PT LONG TERM  GOAL #4   Title decrease LBP 50% with activities    Status Partially Met      PT LONG TERM GOAL #5   Title increase lumbar ROM 25%  Status On-going                 Plan - 01/05/20 1607    Clinical Impression Statement Patient reports that she has more right low back pain today, reports that she did some laundry and had to go up and down the stairs some.  She was very tender in this area, she is still really struggling with walking and shortness of breath, O2 prior to activity was 88% which is the highest that I have seen it without her practicing pursed lipped breathing.  after 140 feet of walking she had to stop due to SOB and O2 was 79%    PT Next Visit Plan see if todays treatment helped her pain    Consulted and Agree with Plan of Care Patient           Patient will benefit from skilled therapeutic intervention in order to improve the following deficits and impairments:  Abnormal gait, Decreased range of motion, Difficulty walking, Increased muscle spasms, Decreased activity tolerance, Pain, Decreased balance, Impaired flexibility, Improper body mechanics, Postural dysfunction, Decreased strength, Decreased mobility  Visit Diagnosis: Acute bilateral low back pain without sciatica  Other abnormalities of gait and mobility  Muscle spasm of back  Muscle weakness (generalized)  Difficulty in walking, not elsewhere classified  Risk for falls  Physical debility     Problem List Patient Active Problem List   Diagnosis Date Noted  . Acute cystitis with hematuria 12/19/2019  . Hypomagnesemia 07/11/2017  . Nausea vomiting and diarrhea 07/10/2017  . Hypokalemia 07/10/2017  . Chronic diastolic CHF (congestive heart failure) ([place]) 07/10/2017  . Seizure ([place]) 07/10/2017  . OSA (obstructive sleep apnea) 07/10/2017  . Sepsis ([place]) 07/10/2017  . Seizure disorder ([place]) 07/05/2017  . Pulmonary HTN ([place]) 07/05/2017  . Anxiety state   . Physical debility 06/23/2017  .  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 History of acute respiratory failure   . Tracheostomy, acute management ([place])   . Aspiration pneumonia ([place])   . Encephalopathy   . Status epilepticus ([place])   . Benign essential HTN   . 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 History of pulmonary embolism   . Acute blood loss anemia   . Diverticulitis of colon   . Tachypnea   . Encounter for orogastric (OG) tube placement   . Encounter for central line placement   . Encounter for attention to tracheostomy ([place])   . Acute respiratory failure with hypoxia ([place])   . Acute encephalopathy 06/06/2017  . Diverticulitis of large intestine without perforation or abscess without bleeding   . Abdominal pain   . Non-intractable cyclical vomiting with nausea   . Abnormal CT of the abdomen   . Hypocalcemia 05/27/2017  . 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 Colitis 05/27/2017  . Central retinal vein occlusion of right eye 04/20/2016  . Nuclear sclerotic cataract of both eyes 04/20/2016  . Diabetic foot infection ([place]) 07/21/2015  . Diabetes mellitus type 2, controlled ([place]) 07/21/2015  . Cellulitis of foot, right 07/21/2015  . Cellulitis of right foot 07/21/2015  . Acute pulmonary embolism ([place]) 04/01/2013  . DVT (deep venous thrombosis) ([place]) 04/01/2013  . HX: breast cancer 04/01/2013  . Concussion 04/01/2013  . Diabetes ([place]) 04/01/2013  . Essential hypertension, benign 04/01/2013  . Hypothyroidism 04/01/2013  . OSA on CPAP 04/01/2013    [name] Boast., PT 01/05/2020, 4:10 PM  [facility]  [place], [name], [address] Phone: [phone]   Fax:  [phone]  Name: Bertine Schlottman MRN: [id number] Date of Birth: Jun 07, 1941

## 2020-01-06 ENCOUNTER — Ambulatory Visit (INDEPENDENT_AMBULATORY_CARE_PROVIDER_SITE_OTHER): Payer: Medicare PPO

## 2020-01-06 DIAGNOSIS — R002 Palpitations: Secondary | ICD-10-CM

## 2020-01-07 ENCOUNTER — Ambulatory Visit: Payer: Medicare PPO | Admitting: Physical Therapy

## 2020-01-07 ENCOUNTER — Encounter: Payer: Self-pay | Admitting: Physical Therapy

## 2020-01-07 ENCOUNTER — Other Ambulatory Visit: Payer: Self-pay

## 2020-01-07 DIAGNOSIS — M545 Low back pain, unspecified: Secondary | ICD-10-CM

## 2020-01-07 DIAGNOSIS — R2689 Other abnormalities of gait and mobility: Secondary | ICD-10-CM

## 2020-01-07 DIAGNOSIS — Z9181 History of falling: Secondary | ICD-10-CM

## 2020-01-07 DIAGNOSIS — M62830 Muscle spasm of back: Secondary | ICD-10-CM

## 2020-01-07 DIAGNOSIS — R5381 Other malaise: Secondary | ICD-10-CM

## 2020-01-07 DIAGNOSIS — M6281 Muscle weakness (generalized): Secondary | ICD-10-CM

## 2020-01-07 DIAGNOSIS — R262 Difficulty in walking, not elsewhere classified: Secondary | ICD-10-CM

## 2020-01-07 NOTE — Therapy (Signed)
[facility] [address] [place], [name], [address] Phone: [phone]   Fax:  [phone] Progress Note Reporting Period 09/23/19 to 01/07/20 for the first 10 visits  See note below for Objective Data and Assessment of Progress/Goals.      Physical Therapy Treatment  Patient Details  Name: Amanda Short MRN: [id number] Date of Birth: 21-Mar-1941 Referring Provider (PT): Rennard   Encounter Date: 01/07/2020   PT End of Session - 01/07/20 1606    Visit Number 10    Number of Visits 20    Date for PT Re-Evaluation 02/16/20    Authorization Type [employer]    PT Start Time 1525    PT Stop Time 1611    PT Time Calculation (min) 46 min    Activity Tolerance Patient limited by fatigue    Behavior During Therapy [facility] for tasks assessed/performed           Past Medical History:  Diagnosis Date  . Abdominal pain   . Abnormal CT of the abdomen   . Acute blood loss anemia   . Acute cystitis with hematuria 12/19/2019  . Acute encephalopathy 06/06/2017  . Acute pulmonary embolism ([place]) 04/01/2013  . Acute respiratory failure with hypoxia ([place])   . Anxiety state   . Arthritis    "back, hands" (07/22/2015)  . Aspiration pneumonia ([place])   . Benign essential HTN   . Cancer of left breast ([place]) 2006   S/P lumpectomy  . Cellulitis of foot, right 07/21/2015  . Cellulitis of right foot 07/21/2015  . Central retinal vein occlusion of right eye 04/20/2016  . Chronic diastolic CHF (congestive heart failure) ([place]) 07/10/2017  . Colitis 05/27/2017  . Complication of anesthesia    "brief breathing problem at surgery center in ~ 2005 when I had gallbladder OR"  . Concussion 03/2013   due to fall  . Depression   . Diabetes ([place]) 04/01/2013  . 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 Diabetes mellitus type 2, controlled ([place]) 07/21/2015  . Diabetic foot infection ([place]) 07/21/2015  . Diverticulitis of colon   . Diverticulitis of  large intestine without perforation or abscess without bleeding   . Diverticulosis   . DVT (deep venous thrombosis) ([place]) 03/2013   LLE  . Encephalopathy   . Encounter for attention to tracheostomy ([place])   . Encounter for central line placement   . Encounter for orogastric (OG) tube placement   . Essential hypertension, benign 04/01/2013  . GERD (gastroesophageal reflux disease)   . History of acute respiratory failure   . History of pulmonary embolism   . HX: breast cancer 04/01/2013  . Hyperlipidemia   . Hypertension   . Hypocalcemia 05/27/2017  . Hypokalemia 07/10/2017  . Hypomagnesemia 07/11/2017  . Hypothyroidism   . Nausea vomiting and diarrhea 07/10/2017  . Non-intractable cyclical vomiting with nausea   . Nuclear sclerotic cataract of both eyes 04/20/2016  . OSA (obstructive sleep apnea) 07/10/2017  . OSA on CPAP 04/01/2013  . OSA treated with BiPAP   . Physical debility 06/23/2017  . 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 Pulmonary embolism (Sig[place]) 03/2013  . Pulmonary HTN ([place]) 07/05/2017  . Seizure ([place]) 07/10/2017  . Seizure disorder ([place]) 07/05/2017  . Seizures ([place]ac[place])   . Sepsis ([place]) 07/10/2017  . Sleep apnea   . Status epilepticus ([place])   . 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 Tachypnea   . Thyroid disease  Hypothyroid  . Tracheostomy, acute management ([place])   . Type II diabetes mellitus ([place])     Past Surgical History:  Procedure Laterality Date  . ABDOMINAL HYSTERECTOMY  1970s  . BREAST BIOPSY Left 2006  . BREAST LUMPECTOMY Left 2006  . BUNIONECTOMY WITH HAMMERTOE RECONSTRUCTION Left   . ESOPHAGOGASTRODUODENOSCOPY N/A 05/29/2017   Procedure: ESOPHAGOGASTRODUODENOSCOPY (EGD);  Surgeon: Irene Shipper, MD;  Location: [facility];  Service: Endoscopy;  Laterality: N/A;  . JOINT REPLACEMENT    . LAPAROSCOPIC CHOLECYSTECTOMY  ~ 2005  . TOTAL HIP ARTHROPLASTY Left 2010  . TOTAL KNEE ARTHROPLASTY Bilateral 2005-2006    There were no vitals filed for this  visit.   Subjective Assessment - 01/07/20 1527    Subjective Patient has been fitted with a heart monitor she will wear the next 7 days.  She and her husband report that she has been a little more unsteady walking today    Currently in Pain? Yes    Pain Score 3     Pain Location Back                             [facility] Adult PT Treatment/Exercise - 01/07/20 0001      Ambulation/Gait   Gait Comments gait 135 feet at the end of the session and then had to rest      High Level Balance   High Level Balance Activities Side stepping;Backward walking;Negotiating over obstacles    High Level Balance Comments airex ball toss wiht close CG, toe touches 4" step and then toe touches while on firm airex      Lumbar Exercises: Aerobic   Recumbent Bike 1 minute 45 seconds , O2 saturation dropped to 80%    Nustep level 5 x 6 minutes      Lumbar Exercises: Standing   Other Standing Lumbar Exercises 2.5# hip marching, abduction and extension 10x each leg                    PT Short Term Goals - 12/04/19 1627      PT SHORT TERM GOAL #1   Title independent with intial HEP    Status Achieved             PT Long Term Goals - 01/07/20 1611      PT LONG TERM GOAL #1   Title increase overall hip LE strength for functional gait and safety hips and ankles to 4+/5    Status On-going      PT LONG TERM GOAL #2   Title decrease TUG time to less than 14 seconds for functional gait and safety    Status Partially Met                 Plan - 01/07/20 1609    Clinical Impression Statement Paitent without LBP today, she had some difficulty with balance, and really had difficulty on the bike but we did push her to keep RPM above 45 and able to do 1 minute and 45 seconds, this is a minute more than the last time we tested this.    PT Next Visit Plan will continue to push strength, function and balance    Consulted and Agree with Plan of Care Patient            Patient will benefit from skilled therapeutic intervention in order to improve the following deficits and impairments:  Abnormal gait, Decreased range  of motion, Difficulty walking, Increased muscle spasms, Decreased activity tolerance, Pain, Decreased balance, Impaired flexibility, Improper body mechanics, Postural dysfunction, Decreased strength, Decreased mobility  Visit Diagnosis: Acute bilateral low back pain without sciatica  Other abnormalities of gait and mobility  Muscle spasm of back  Muscle weakness (generalized)  Risk for falls  Difficulty in walking, not elsewhere classified  Physical debility     Problem List Patient Active Problem List   Diagnosis Date Noted  . Acute cystitis with hematuria 12/19/2019  . Hypomagnesemia 07/11/2017  . Nausea vomiting and diarrhea 07/10/2017  . Hypokalemia 07/10/2017  . Chronic diastolic CHF (congestive heart failure) ([place]) 07/10/2017  . Seizure ([place]) 07/10/2017  . OSA (obstructive sleep apnea) 07/10/2017  . Sepsis ([place]) 07/10/2017  . Seizure disorder ([place]) 07/05/2017  . Pulmonary HTN ([place]) 07/05/2017  . Anxiety state   . Physical debility 06/23/2017  . 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 History of acute respiratory failure   . Tracheostomy, acute management ([place])   . Aspiration pneumonia ([place])   . Encephalopathy   . Status epilepticus ([place])   . Benign essential HTN   . 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 History of pulmonary embolism   . Acute blood loss anemia   . Diverticulitis of colon   . Tachypnea   . Encounter for orogastric (OG) tube placement   . Encounter for central line placement   . Encounter for attention to tracheostomy ([place])   . Acute respiratory failure with hypoxia ([place])   . Acute encephalopathy 06/06/2017  . Diverticulitis of large intestine without perforation or abscess without bleeding   . Abdominal pain   . Non-intractable cyclical vomiting with nausea   . Abnormal CT of  the abdomen   . Hypocalcemia 05/27/2017  . 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 Colitis 05/27/2017  . Central retinal vein occlusion of right eye 04/20/2016  . Nuclear sclerotic cataract of both eyes 04/20/2016  . Diabetic foot infection ([place]) 07/21/2015  . Diabetes mellitus type 2, controlled ([place]) 07/21/2015  . Cellulitis of foot, right 07/21/2015  . Cellulitis of right foot 07/21/2015  . Acute pulmonary embolism ([place]) 04/01/2013  . DVT (deep venous thrombosis) ([place]) 04/01/2013  . HX: breast cancer 04/01/2013  . Concussion 04/01/2013  . Diabetes ([place]) 04/01/2013  . Essential hypertension, benign 04/01/2013  . Hypothyroidism 04/01/2013  . OSA on CPAP 04/01/2013    [name] Boast., PT 01/07/2020, 4:18 PM  [place], [name], [address] Phone: [phone]   Fax:  [phone]  Name: Amanda Short MRN: [id number] Date of Birth: July 03, 1941

## 2020-01-12 ENCOUNTER — Encounter: Payer: Self-pay | Admitting: Physical Therapy

## 2020-01-12 ENCOUNTER — Ambulatory Visit: Payer: Medicare PPO | Admitting: Physical Therapy

## 2020-01-12 ENCOUNTER — Other Ambulatory Visit: Payer: Self-pay

## 2020-01-12 DIAGNOSIS — R2689 Other abnormalities of gait and mobility: Secondary | ICD-10-CM

## 2020-01-12 DIAGNOSIS — R262 Difficulty in walking, not elsewhere classified: Secondary | ICD-10-CM

## 2020-01-12 DIAGNOSIS — M6281 Muscle weakness (generalized): Secondary | ICD-10-CM

## 2020-01-12 DIAGNOSIS — M545 Low back pain, unspecified: Secondary | ICD-10-CM

## 2020-01-12 DIAGNOSIS — Z9181 History of falling: Secondary | ICD-10-CM

## 2020-01-12 DIAGNOSIS — M62830 Muscle spasm of back: Secondary | ICD-10-CM

## 2020-01-12 DIAGNOSIS — R5381 Other malaise: Secondary | ICD-10-CM

## 2020-01-12 NOTE — Therapy (Signed)
[place], [name], [address] Phone: [phone]   Fax:  [phone]  Physical Therapy Treatment  Patient Details  Name: Amanda Short MRN: [id number] Date of Birth: 10/24/1940 Referring Provider (PT): Rennard   Encounter Date: 01/12/2020   PT End of Session - 01/12/20 1525    Visit Number 11    Number of Visits 20    Date for PT Re-Evaluation 02/16/20    Authorization Type [employer]    PT Start Time 1430    PT Stop Time 1516    PT Time Calculation (min) 46 min    Activity Tolerance Patient limited by fatigue    Behavior During Therapy [facility] for tasks assessed/performed           Past Medical History:  Diagnosis Date  . Abdominal pain   . Abnormal CT of the abdomen   . Acute blood loss anemia   . Acute cystitis with hematuria 12/19/2019  . Acute encephalopathy 06/06/2017  . Acute pulmonary embolism ([place]) 04/01/2013  . Acute respiratory failure with hypoxia ([place])   . Anxiety state   . Arthritis    "back, hands" (07/22/2015)  . Aspiration pneumonia ([place])   . Benign essential HTN   . Cancer of left breast ([place]) 2006   S/P lumpectomy  . Cellulitis of foot, right 07/21/2015  . Cellulitis of right foot 07/21/2015  . Central retinal vein occlusion of right eye 04/20/2016  . Chronic diastolic CHF (congestive heart failure) ([place]) 07/10/2017  . Colitis 05/27/2017  . Complication of anesthesia    "brief breathing problem at surgery center in ~ 2005 when I had gallbladder OR"  . Concussion 03/2013   due to fall  . Depression   . Diabetes ([place]) 04/01/2013  . 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 Diabetes mellitus type 2, controlled ([place]) 07/21/2015  . Diabetic foot infection ([place]) 07/21/2015  . Diverticulitis of colon   . Diverticulitis of large intestine without perforation or abscess without bleeding   . Diverticulosis   . DVT (deep venous thrombosis) ([place]) 03/2013   LLE  . Encephalopathy    . Encounter for attention to tracheostomy ([place])   . Encounter for central line placement   . Encounter for orogastric (OG) tube placement   . Essential hypertension, benign 04/01/2013  . GERD (gastroesophageal reflux disease)   . History of acute respiratory failure   . History of pulmonary embolism   . HX: breast cancer 04/01/2013  . Hyperlipidemia   . Hypertension   . Hypocalcemia 05/27/2017  . Hypokalemia 07/10/2017  . Hypomagnesemia 07/11/2017  . Hypothyroidism   . Nausea vomiting and diarrhea 07/10/2017  . Non-intractable cyclical vomiting with nausea   . Nuclear sclerotic cataract of both eyes 04/20/2016  . OSA (obstructive sleep apnea) 07/10/2017  . OSA on CPAP 04/01/2013  . OSA treated with BiPAP   . Physical debility 06/23/2017  . 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 Pulmonary embolism ([place]) 03/2013  . Pulmonary HTN ([place]) 07/05/2017  . Seizure ([place]) 07/10/2017  . Seizure disorder ([place]) 07/05/2017  . Seizures ([place])   . Sepsis ([place]) 07/10/2017  . Sleep apnea   . Status epilepticus ([place])   . 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 Tachypnea   . Thyroid disease    Hypothyroid  . Tracheostomy, acute management ([place])   . Type II diabetes mellitus ([place])     Past Surgical History:  Procedure Laterality Date  .  ABDOMINAL HYSTERECTOMY  1970s  . BREAST BIOPSY Left 2006  . BREAST LUMPECTOMY Left 2006  . BUNIONECTOMY WITH HAMMERTOE RECONSTRUCTION Left   . ESOPHAGOGASTRODUODENOSCOPY N/A 05/29/2017   Procedure: ESOPHAGOGASTRODUODENOSCOPY (EGD);  Surgeon: Irene Shipper, MD;  Location: [facility];  Service: Endoscopy;  Laterality: N/A;  . JOINT REPLACEMENT    . LAPAROSCOPIC CHOLECYSTECTOMY  ~ 2005  . TOTAL HIP ARTHROPLASTY Left 2010  . TOTAL KNEE ARTHROPLASTY Bilateral 2005-2006    There were no vitals filed for this visit.   Subjective Assessment - 01/12/20 1432    Subjective Patient is on her last day on the heart monitor, she reports she has had some episodes, she  reports that she is feeling better with her balance    Currently in Pain? Yes    Pain Score 5     Pain Location Back    Pain Orientation Lower    Pain Descriptors / Indicators Sore    Aggravating Factors  activity, did the dishes last night                             [facility] Adult PT Treatment/Exercise - 01/12/20 0001      Ambulation/Gait   Gait Comments fast walking 120 feet.        High Level Balance   High Level Balance Activities Side stepping;Backward walking;Negotiating over obstacles    High Level Balance Comments airex ball toss with close CG, toe touches 8" step and then toe touches while on firm airex, side to side step overs,      Lumbar Exercises: Aerobic   UBE (Upper Arm Bike) level 4 x 4 minutes    Recumbent Bike 5 minutes with it not turned on.      Lumbar Exercises: Machines for Strengthening   Cybex Knee Extension 10# 3x10    Cybex Knee Flexion 25# 3x10    Other Lumbar Machine Exercise 20# seated rows and lats    Other Lumbar Machine Exercise 5# rows standing on airex 2x15                    PT Short Term Goals - 12/04/19 1627      PT SHORT TERM GOAL #1   Title independent with intial HEP    Status Achieved             PT Long Term Goals - 01/07/20 1611      PT LONG TERM GOAL #1   Title increase overall hip LE strength for functional gait and safety hips and ankles to 4+/5    Status On-going      PT LONG TERM GOAL #2   Title decrease TUG time to less than 14 seconds for functional gait and safety    Status Partially Met                 Plan - 01/12/20 1525    Clinical Impression Statement Throughout our session her O2 saturation was b/n 79% and 85%.  She haas some wheezing on expiration.  She did well with the balance today, just really struggles with SOB and with dynamic surface standing    PT Next Visit Plan will continue to push strength, function and balance    Consulted and Agree with Plan of Care Patient            Patient will benefit from skilled therapeutic intervention in order to improve the following deficits and  impairments:  Abnormal gait, Decreased range of motion, Difficulty walking, Increased muscle spasms, Decreased activity tolerance, Pain, Decreased balance, Impaired flexibility, Improper body mechanics, Postural dysfunction, Decreased strength, Decreased mobility  Visit Diagnosis: Acute bilateral low back pain without sciatica  Other abnormalities of gait and mobility  Muscle spasm of back  Muscle weakness (generalized)  Risk for falls  Difficulty in walking, not elsewhere classified  Physical debility     Problem List Patient Active Problem List   Diagnosis Date Noted  . Acute cystitis with hematuria 12/19/2019  . Hypomagnesemia 07/11/2017  . Nausea vomiting and diarrhea 07/10/2017  . Hypokalemia 07/10/2017  . Chronic diastolic CHF (congestive heart failure) ([place]) 07/10/2017  . Seizure ([place]) 07/10/2017  . OSA (obstructive sleep apnea) 07/10/2017  . Sepsis ([place]) 07/10/2017  . Seizure disorder ([place]) 07/05/2017  . Pulmonary HTN ([place]) 07/05/2017  . Anxiety state   . Physical debility 06/23/2017  . 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 History of acute respiratory failure   . Tracheostomy, acute management ([place])   . Aspiration pneumonia ([place])   . Encephalopathy   . Status epilepticus ([place])   . Benign essential HTN   . 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 History of pulmonary embolism   . Acute blood loss anemia   . Diverticulitis of colon   . Tachypnea   . Encounter for orogastric (OG) tube placement   . Encounter for central line placement   . Encounter for attention to tracheostomy ([place])   . Acute respiratory failure with hypoxia ([place])   . Acute encephalopathy 06/06/2017  . Diverticulitis of large intestine without perforation or abscess without bleeding   . Abdominal pain   . Non-intractable cyclical vomiting with nausea   .  Abnormal CT of the abdomen   . Hypocalcemia 05/27/2017  . 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 Colitis 05/27/2017  . Central retinal vein occlusion of right eye 04/20/2016  . Nuclear sclerotic cataract of both eyes 04/20/2016  . Diabetic foot infection ([place]) 07/21/2015  . Diabetes mellitus type 2, controlled ([place]) 07/21/2015  . Cellulitis of foot, right 07/21/2015  . Cellulitis of right foot 07/21/2015  . Acute pulmonary embolism ([place]) 04/01/2013  . DVT (deep venous thrombosis) ([place]) 04/01/2013  . HX: breast cancer 04/01/2013  . Concussion 04/01/2013  . Diabetes ([place]) 04/01/2013  . Essential hypertension, benign 04/01/2013  . Hypothyroidism 04/01/2013  . OSA on CPAP 04/01/2013    [name] Boast., PT 01/12/2020, 3:27 PM  [place], [name], [address] Phone: [phone]   Fax:  [phone]  Name: Oksana Deberry MRN: [id number] Date of Birth: [birth year]

## 2020-01-15 ENCOUNTER — Other Ambulatory Visit: Payer: Self-pay

## 2020-01-15 ENCOUNTER — Encounter: Payer: Self-pay | Admitting: Physical Therapy

## 2020-01-15 ENCOUNTER — Ambulatory Visit: Payer: Medicare PPO | Attending: Family Medicine | Admitting: Physical Therapy

## 2020-01-15 DIAGNOSIS — Z9181 History of falling: Secondary | ICD-10-CM | POA: Diagnosis present

## 2020-01-15 DIAGNOSIS — R262 Difficulty in walking, not elsewhere classified: Secondary | ICD-10-CM | POA: Insufficient documentation

## 2020-01-15 DIAGNOSIS — R2689 Other abnormalities of gait and mobility: Secondary | ICD-10-CM | POA: Diagnosis present

## 2020-01-15 DIAGNOSIS — M6281 Muscle weakness (generalized): Secondary | ICD-10-CM | POA: Insufficient documentation

## 2020-01-15 DIAGNOSIS — M545 Low back pain, unspecified: Secondary | ICD-10-CM

## 2020-01-15 DIAGNOSIS — R5381 Other malaise: Secondary | ICD-10-CM | POA: Insufficient documentation

## 2020-01-15 DIAGNOSIS — M62830 Muscle spasm of back: Secondary | ICD-10-CM

## 2020-01-15 NOTE — Therapy (Signed)
[place], [name], [address] Phone: [phone]   Fax:  [phone]  Physical Therapy Treatment  Patient Details  Name: Amanda Short MRN: [id number] Date of Birth: 07-24-1940 Referring Provider (PT): Rennard   Encounter Date: 01/15/2020   PT End of Session - 01/15/20 1526    Visit Number 12    Number of Visits 20    Date for PT Re-Evaluation 02/16/20    Authorization Type [employer]    PT Start Time 3428    PT Stop Time 1522    PT Time Calculation (min) 49 min    Activity Tolerance Patient limited by fatigue    Behavior During Therapy [facility] for tasks assessed/performed           Past Medical History:  Diagnosis Date  . Abdominal pain   . Abnormal CT of the abdomen   . Acute blood loss anemia   . Acute cystitis with hematuria 12/19/2019  . Acute encephalopathy 06/06/2017  . Acute pulmonary embolism ([place]) 04/01/2013  . Acute respiratory failure with hypoxia ([place])   . Anxiety state   . Arthritis    "back, hands" (07/22/2015)  . Aspiration pneumonia ([place])   . Benign essential HTN   . Cancer of left breast ([place]) 2006   S/P lumpectomy  . Cellulitis of foot, right 07/21/2015  . Cellulitis of right foot 07/21/2015  . Central retinal vein occlusion of right eye 04/20/2016  . Chronic diastolic CHF (congestive heart failure) ([place]) 07/10/2017  . Colitis 05/27/2017  . Complication of anesthesia    "brief breathing problem at surgery center in ~ 2005 when I had gallbladder OR"  . Concussion 03/2013   due to fall  . Depression   . Diabetes ([place]) 04/01/2013  . 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 Diabetes mellitus type 2, controlled ([place]) 07/21/2015  . Diabetic foot infection ([place]) 07/21/2015  . Diverticulitis of colon   . Diverticulitis of large intestine without perforation or abscess without bleeding   . Diverticulosis   . DVT (deep venous thrombosis) ([place]) 03/2013   LLE  . Encephalopathy    . Encounter for attention to tracheostomy ([place])   . Encounter for central line placement   . Encounter for orogastric (OG) tube placement   . Essential hypertension, benign 04/01/2013  . GERD (gastroesophageal reflux disease)   . History of acute respiratory failure   . History of pulmonary embolism   . HX: breast cancer 04/01/2013  . Hyperlipidemia   . Hypertension   . Hypocalcemia 05/27/2017  . Hypokalemia 07/10/2017  . Hypomagnesemia 07/11/2017  . Hypothyroidism   . Nausea vomiting and diarrhea 07/10/2017  . Non-intractable cyclical vomiting with nausea   . Nuclear sclerotic cataract of both eyes 04/20/2016  . OSA (obstructive sleep apnea) 07/10/2017  . OSA on CPAP 04/01/2013  . OSA treated with BiPAP   . Physical debility 06/23/2017  . 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 Pulmonary embolism ([place]) 03/2013  . Pulmonary HTN ([place]) 07/05/2017  . Seizure ([place]) 07/10/2017  . Seizure disorder ([place]) 07/05/2017  . Seizures ([place])   . Sepsis ([place]) 07/10/2017  . Sleep apnea   . Status epilepticus ([place])   . 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 Tachypnea   . Thyroid disease    Hypothyroid  . Tracheostomy, acute management ([place])   . Type II diabetes mellitus ([place])     Past Surgical History:  Procedure Laterality Date  .  ABDOMINAL HYSTERECTOMY  1970s  . BREAST BIOPSY Left 2006  . BREAST LUMPECTOMY Left 2006  . BUNIONECTOMY WITH HAMMERTOE RECONSTRUCTION Left   . ESOPHAGOGASTRODUODENOSCOPY N/A 05/29/2017   Procedure: ESOPHAGOGASTRODUODENOSCOPY (EGD);  Surgeon: Irene Shipper, MD;  Location: [facility];  Service: Endoscopy;  Laterality: N/A;  . JOINT REPLACEMENT    . LAPAROSCOPIC CHOLECYSTECTOMY  ~ 2005  . TOTAL HIP ARTHROPLASTY Left 2010  . TOTAL KNEE ARTHROPLASTY Bilateral 2005-2006    There were no vitals filed for this visit.   Subjective Assessment - 01/15/20 1509    Subjective PAtient reports no back pain, SOB comes and goes.    Currently in Pain? No/denies                              [facility] Adult PT Treatment/Exercise - 01/15/20 0001      High Level Balance   High Level Balance Activities Side stepping;Backward walking;Negotiating over obstacles    High Level Balance Comments walking ball toss, on airex ball toss, on airex red tband scapular stabilization      Lumbar Exercises: Standing   Other Standing Lumbar Exercises 2.5# hip marching, abduction and extension 10x each leg      Lumbar Exercises: Seated   Other Seated Lumbar Exercises bosu behind partial crunch                    PT Short Term Goals - 12/04/19 1627      PT SHORT TERM GOAL #1   Title independent with intial HEP    Status Achieved             PT Long Term Goals - 01/15/20 1542      PT LONG TERM GOAL #1   Title increase overall hip LE strength for functional gait and safety hips and ankles to 4+/5    Status On-going      PT LONG TERM GOAL #3   Title increase BERG to 50/56 for safety and independence    Status On-going                 Plan - 01/15/20 1527    Clinical Impression Statement O2 saturation ranged from 71% to 93%, very SOB with walking, she tends to concentrate with activites and this causes her to hold her breath.  No back pain, did better with balance    PT Next Visit Plan will continue to push strength, function and balance    Consulted and Agree with Plan of Care Patient           Patient will benefit from skilled therapeutic intervention in order to improve the following deficits and impairments:  Abnormal gait, Decreased range of motion, Difficulty walking, Increased muscle spasms, Decreased activity tolerance, Pain, Decreased balance, Impaired flexibility, Improper body mechanics, Postural dysfunction, Decreased strength, Decreased mobility  Visit Diagnosis: Acute bilateral low back pain without sciatica  Other abnormalities of gait and mobility  Muscle spasm of back  Muscle weakness (generalized)  Risk  for falls  Difficulty in walking, not elsewhere classified  Physical debility     Problem List Patient Active Problem List   Diagnosis Date Noted  . Acute cystitis with hematuria 12/19/2019  . Hypomagnesemia 07/11/2017  . Nausea vomiting and diarrhea 07/10/2017  . Hypokalemia 07/10/2017  . Chronic diastolic CHF (congestive heart failure) ([place]) 07/10/2017  . Seizure ([place]) 07/10/2017  . OSA (obstructive sleep apnea) 07/10/2017  . Sepsis (  [place]) 07/10/2017  . Seizure disorder ([place]) 07/05/2017  . Pulmonary HTN ([place]) 07/05/2017  . Anxiety state   . Physical debility 06/23/2017  . 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 History of acute respiratory failure   . Tracheostomy, acute management ([place])   . Aspiration pneumonia ([place])   . Encephalopathy   . Status epilepticus ([place])   . Benign essential HTN   . 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 History of pulmonary embolism   . Acute blood loss anemia   . Diverticulitis of colon   . Tachypnea   . Encounter for orogastric (OG) tube placement   . Encounter for central line placement   . Encounter for attention to tracheostomy ([place])   . Acute respiratory failure with hypoxia ([place])   . Acute encephalopathy 06/06/2017  . Diverticulitis of large intestine without perforation or abscess without bleeding   . Abdominal pain   . Non-intractable cyclical vomiting with nausea   . Abnormal CT of the abdomen   . Hypocalcemia 05/27/2017  . 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 Colitis 05/27/2017  . Central retinal vein occlusion of right eye 04/20/2016  . Nuclear sclerotic cataract of both eyes 04/20/2016  . Diabetic foot infection ([place]) 07/21/2015  . Diabetes mellitus type 2, controlled ([place]) 07/21/2015  . Cellulitis of foot, right 07/21/2015  . Cellulitis of right foot 07/21/2015  . Acute pulmonary embolism ([place]) 04/01/2013  . DVT (deep venous thrombosis) ([place]) 04/01/2013  . HX: breast cancer 04/01/2013  . Concussion 04/01/2013   . Diabetes ([place]) 04/01/2013  . Essential hypertension, benign 04/01/2013  . Hypothyroidism 04/01/2013  . OSA on CPAP 04/01/2013    [name] Boast., PT 01/15/2020, 3:43 PM  [place], [name], [address] Phone: [phone]   Fax:  [phone]  Name: Amanda Short MRN: [id number] Date of Birth: 03-14-1941

## 2020-01-20 ENCOUNTER — Encounter: Payer: Self-pay | Admitting: Physical Therapy

## 2020-01-20 ENCOUNTER — Other Ambulatory Visit: Payer: Self-pay

## 2020-01-20 ENCOUNTER — Ambulatory Visit: Payer: Medicare PPO | Admitting: Physical Therapy

## 2020-01-20 DIAGNOSIS — M545 Low back pain, unspecified: Secondary | ICD-10-CM

## 2020-01-20 DIAGNOSIS — Z9181 History of falling: Secondary | ICD-10-CM

## 2020-01-20 DIAGNOSIS — R262 Difficulty in walking, not elsewhere classified: Secondary | ICD-10-CM

## 2020-01-20 DIAGNOSIS — R5381 Other malaise: Secondary | ICD-10-CM

## 2020-01-20 DIAGNOSIS — R2689 Other abnormalities of gait and mobility: Secondary | ICD-10-CM

## 2020-01-20 DIAGNOSIS — M62830 Muscle spasm of back: Secondary | ICD-10-CM

## 2020-01-20 DIAGNOSIS — M6281 Muscle weakness (generalized): Secondary | ICD-10-CM

## 2020-01-20 NOTE — Therapy (Signed)
[place], [name], [address] Phone: [phone]   Fax:  [phone]  Physical Therapy Treatment  Patient Details  Name: Amanda Short MRN: [id number] Date of Birth: May 21, 1941 Referring Provider (PT): Rennard   Encounter Date: 01/20/2020   PT End of Session - 01/20/20 1711    Visit Number 13    Number of Visits 20    Date for PT Re-Evaluation 02/16/20    Authorization Type [employer]    PT Start Time 1605    PT Stop Time 1655    PT Time Calculation (min) 50 min    Activity Tolerance Patient limited by fatigue    Behavior During Therapy [facility] for tasks assessed/performed           Past Medical History:  Diagnosis Date  . Abdominal pain   . Abnormal CT of the abdomen   . Acute blood loss anemia   . Acute cystitis with hematuria 12/19/2019  . Acute encephalopathy 06/06/2017  . Acute pulmonary embolism ([place]) 04/01/2013  . Acute respiratory failure with hypoxia ([place])   . Anxiety state   . Arthritis    "back, hands" (07/22/2015)  . Aspiration pneumonia ([place])   . Benign essential HTN   . Cancer of left breast ([place]) 2006   S/P lumpectomy  . Cellulitis of foot, right 07/21/2015  . Cellulitis of right foot 07/21/2015  . Central retinal vein occlusion of right eye 04/20/2016  . Chronic diastolic CHF (congestive heart failure) ([place]) 07/10/2017  . Colitis 05/27/2017  . Complication of anesthesia    "brief breathing problem at surgery center in ~ 2005 when I had gallbladder OR"  . Concussion 03/2013   due to fall  . Depression   . Diabetes ([place]) 04/01/2013  . 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 Diabetes mellitus type 2, controlled ([place]) 07/21/2015  . Diabetic foot infection ([place]) 07/21/2015  . Diverticulitis of colon   . Diverticulitis of large intestine without perforation or abscess without bleeding   . Diverticulosis   . DVT (deep venous thrombosis) ([place]) 03/2013   LLE  . Encephalopathy    . Encounter for attention to tracheostomy ([place])   . Encounter for central line placement   . Encounter for orogastric (OG) tube placement   . Essential hypertension, benign 04/01/2013  . GERD (gastroesophageal reflux disease)   . History of acute respiratory failure   . History of pulmonary embolism   . HX: breast cancer 04/01/2013  . Hyperlipidemia   . Hypertension   . Hypocalcemia 05/27/2017  . Hypokalemia 07/10/2017  . Hypomagnesemia 07/11/2017  . Hypothyroidism   . Nausea vomiting and diarrhea 07/10/2017  . Non-intractable cyclical vomiting with nausea   . Nuclear sclerotic cataract of both eyes 04/20/2016  . OSA (obstructive sleep apnea) 07/10/2017  . OSA on CPAP 04/01/2013  . OSA treated with BiPAP   . Physical debility 06/23/2017  . 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 Pulmonary embolism ([place]) 03/2013  . Pulmonary HTN ([place]) 07/05/2017  . Seizure ([place]) 07/10/2017  . Seizure disorder ([place]) 07/05/2017  . Seizures ([place])   . Sepsis ([place]) 07/10/2017  . Sleep apnea   . Status epilepticus ([place])   . 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 Tachypnea   . Thyroid disease    Hypothyroid  . Tracheostomy, acute management ([place])   . Type II diabetes mellitus ([place])     Past Surgical History:  Procedure Laterality Date  .  ABDOMINAL HYSTERECTOMY  1970s  . BREAST BIOPSY Left 2006  . BREAST LUMPECTOMY Left 2006  . BUNIONECTOMY WITH HAMMERTOE RECONSTRUCTION Left   . ESOPHAGOGASTRODUODENOSCOPY N/A 05/29/2017   Procedure: ESOPHAGOGASTRODUODENOSCOPY (EGD);  Surgeon: Irene Shipper, MD;  Location: [facility];  Service: Endoscopy;  Laterality: N/A;  . JOINT REPLACEMENT    . LAPAROSCOPIC CHOLECYSTECTOMY  ~ 2005  . TOTAL HIP ARTHROPLASTY Left 2010  . TOTAL KNEE ARTHROPLASTY Bilateral 2005-2006    There were no vitals filed for this visit.   Subjective Assessment - 01/20/20 1619    Subjective Patient reports that the hot and humid weather make her breathing worse    Currently in Pain?  No/denies                             [facility] Adult PT Treatment/Exercise - 01/20/20 0001      High Level Balance   High Level Balance Activities Side stepping;Backward walking;Negotiating over obstacles    High Level Balance Comments walking ball toss, on airex ball toss, on airex red tband scapular stabilization, ball kicks      Lumbar Exercises: Aerobic   Nustep level 5 x 6 minutes      Lumbar Exercises: Machines for Strengthening   Cybex Knee Extension 10# 3x10      Lumbar Exercises: Seated   Other Seated Lumbar Exercises sit to stand with weighted ball and push out    Other Seated Lumbar Exercises bosu behind partial crunch                    PT Short Term Goals - 12/04/19 1627      PT SHORT TERM GOAL #1   Title independent with intial HEP    Status Achieved             PT Long Term Goals - 01/15/20 1542      PT LONG TERM GOAL #1   Title increase overall hip LE strength for functional gait and safety hips and ankles to 4+/5    Status On-going      PT LONG TERM GOAL #3   Title increase BERG to 50/56 for safety and independence    Status On-going                 Plan - 01/20/20 1711    Clinical Impression Statement Patient struggled a little more today with the higher temps and humidity, the highest her O2 saturation was today was 85%.  She needed cues to do the pursed lipped breathing.    PT Next Visit Plan will continue to push strength, function and balance    Consulted and Agree with Plan of Care Patient           Patient will benefit from skilled therapeutic intervention in order to improve the following deficits and impairments:  Abnormal gait, Decreased range of motion, Difficulty walking, Increased muscle spasms, Decreased activity tolerance, Pain, Decreased balance, Impaired flexibility, Improper body mechanics, Postural dysfunction, Decreased strength, Decreased mobility  Visit Diagnosis: Acute bilateral low back  pain without sciatica  Other abnormalities of gait and mobility  Muscle spasm of back  Muscle weakness (generalized)  Risk for falls  Difficulty in walking, not elsewhere classified  Physical debility     Problem List Patient Active Problem List   Diagnosis Date Noted  . Acute cystitis with hematuria 12/19/2019  . Hypomagnesemia 07/11/2017  . Nausea vomiting and diarrhea 07/10/2017  .  Hypokalemia 07/10/2017  . Chronic diastolic CHF (congestive heart failure) ([place]) 07/10/2017  . Seizure ([place]) 07/10/2017  . OSA (obstructive sleep apnea) 07/10/2017  . Sepsis ([place]) 07/10/2017  . Seizure disorder ([place]) 07/05/2017  . Pulmonary HTN ([place]) 07/05/2017  . Anxiety state   . Physical debility 06/23/2017  . 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 History of acute respiratory failure   . Tracheostomy, acute management ([place])   . Aspiration pneumonia ([place])   . Encephalopathy   . Status epilepticus ([place])   . Benign essential HTN   . 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 History of pulmonary embolism   . Acute blood loss anemia   . Diverticulitis of colon   . Tachypnea   . Encounter for orogastric (OG) tube placement   . Encounter for central line placement   . Encounter for attention to tracheostomy ([place])   . Acute respiratory failure with hypoxia ([place])   . Acute encephalopathy 06/06/2017  . Diverticulitis of large intestine without perforation or abscess without bleeding   . Abdominal pain   . Non-intractable cyclical vomiting with nausea   . Abnormal CT of the abdomen   . Hypocalcemia 05/27/2017  . 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 Colitis 05/27/2017  . Central retinal vein occlusion of right eye 04/20/2016  . Nuclear sclerotic cataract of both eyes 04/20/2016  . Diabetic foot infection ([place]) 07/21/2015  . Diabetes mellitus type 2, controlled ([place]) 07/21/2015  . Cellulitis of foot, right 07/21/2015  . Cellulitis of right foot 07/21/2015  . Acute pulmonary  embolism ([place]) 04/01/2013  . DVT (deep venous thrombosis) ([place]) 04/01/2013  . HX: breast cancer 04/01/2013  . Concussion 04/01/2013  . Diabetes ([place]) 04/01/2013  . Essential hypertension, benign 04/01/2013  . Hypothyroidism 04/01/2013  . OSA on CPAP 04/01/2013    [name] Boast., PT 01/20/2020, 5:13 PM  [place], [name], [address] Phone: [phone]   Fax:  [phone]  Name: Amanda Short MRN: [id number] Date of Birth: 05/17/1941

## 2020-01-22 ENCOUNTER — Encounter: Payer: Self-pay | Admitting: Physical Therapy

## 2020-01-22 ENCOUNTER — Ambulatory Visit: Payer: Medicare PPO | Admitting: Physical Therapy

## 2020-01-22 ENCOUNTER — Other Ambulatory Visit: Payer: Self-pay

## 2020-01-22 DIAGNOSIS — R5381 Other malaise: Secondary | ICD-10-CM

## 2020-01-22 DIAGNOSIS — R2689 Other abnormalities of gait and mobility: Secondary | ICD-10-CM

## 2020-01-22 DIAGNOSIS — M6281 Muscle weakness (generalized): Secondary | ICD-10-CM

## 2020-01-22 DIAGNOSIS — R262 Difficulty in walking, not elsewhere classified: Secondary | ICD-10-CM

## 2020-01-22 DIAGNOSIS — M62830 Muscle spasm of back: Secondary | ICD-10-CM

## 2020-01-22 DIAGNOSIS — Z9181 History of falling: Secondary | ICD-10-CM

## 2020-01-22 DIAGNOSIS — M545 Low back pain, unspecified: Secondary | ICD-10-CM

## 2020-01-22 NOTE — Therapy (Signed)
[place], [name], [address] Phone: [phone]   Fax:  [phone]  Physical Therapy Treatment  Patient Details  Name: Amanda Short MRN: [id number] Date of Birth: [birth year] Referring Provider (PT): Rennard   Encounter Date: 01/22/2020   PT End of Session - 01/22/20 1354    Visit Number 14    Number of Visits 20    Date for PT Re-Evaluation 02/16/20    Authorization Type [employer]    PT Start Time 1305    PT Stop Time 1354    PT Time Calculation (min) 49 min    Activity Tolerance Patient limited by fatigue    Behavior During Therapy [facility] for tasks assessed/performed           Past Medical History:  Diagnosis Date  . Abdominal pain   . Abnormal CT of the abdomen   . Acute blood loss anemia   . Acute cystitis with hematuria 12/19/2019  . Acute encephalopathy 06/06/2017  . Acute pulmonary embolism ([place]) 04/01/2013  . Acute respiratory failure with hypoxia ([place])   . Anxiety state   . Arthritis    "back, hands" (07/22/2015)  . Aspiration pneumonia ([place])   . Benign essential HTN   . Cancer of left breast ([place]) 2006   S/P lumpectomy  . Cellulitis of foot, right 07/21/2015  . Cellulitis of right foot 07/21/2015  . Central retinal vein occlusion of right eye 04/20/2016  . Chronic diastolic CHF (congestive heart failure) ([place]) 07/10/2017  . Colitis 05/27/2017  . Complication of anesthesia    "brief breathing problem at surgery center in ~ 2005 when I had gallbladder OR"  . Concussion 03/2013   due to fall  . Depression   . Diabetes ([place]) 04/01/2013  . 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 Diabetes mellitus type 2, controlled ([place]) 07/21/2015  . Diabetic foot infection ([place]) 07/21/2015  . Diverticulitis of colon   . Diverticulitis of large intestine without perforation or abscess without bleeding   . Diverticulosis   . DVT (deep venous thrombosis) ([place]) 03/2013   LLE  . Encephalopathy    . Encounter for attention to tracheostomy ([place])   . Encounter for central line placement   . Encounter for orogastric (OG) tube placement   . Essential hypertension, benign 04/01/2013  . GERD (gastroesophageal reflux disease)   . History of acute respiratory failure   . History of pulmonary embolism   . HX: breast cancer 04/01/2013  . Hyperlipidemia   . Hypertension   . Hypocalcemia 05/27/2017  . Hypokalemia 07/10/2017  . Hypomagnesemia 07/11/2017  . Hypothyroidism   . Nausea vomiting and diarrhea 07/10/2017  . Non-intractable cyclical vomiting with nausea   . Nuclear sclerotic cataract of both eyes 04/20/2016  . OSA (obstructive sleep apnea) 07/10/2017  . OSA on CPAP 04/01/2013  . OSA treated with BiPAP   . Physical debility 06/23/2017  . 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 Pulmonary embolism ([place]) 03/2013  . Pulmonary HTN ([place]) 07/05/2017  . Seizure ([place]) 07/10/2017  . Seizure disorder ([place]) 07/05/2017  . Seizures ([place])   . Sepsis ([place]) 07/10/2017  . Sleep apnea   . Status epilepticus ([place])   . 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 Tachypnea   . Thyroid disease    Hypothyroid  . Tracheostomy, acute management ([place])   . Type II diabetes mellitus ([place])     Past Surgical History:  Procedure Laterality Date  .  ABDOMINAL HYSTERECTOMY  1970s  . BREAST BIOPSY Left 2006  . BREAST LUMPECTOMY Left 2006  . BUNIONECTOMY WITH HAMMERTOE RECONSTRUCTION Left   . ESOPHAGOGASTRODUODENOSCOPY N/A 05/29/2017   Procedure: ESOPHAGOGASTRODUODENOSCOPY (EGD);  Surgeon: Irene Shipper, MD;  Location: [facility];  Service: Endoscopy;  Laterality: N/A;  . JOINT REPLACEMENT    . LAPAROSCOPIC CHOLECYSTECTOMY  ~ 2005  . TOTAL HIP ARTHROPLASTY Left 2010  . TOTAL KNEE ARTHROPLASTY Bilateral 2005-2006    There were no vitals filed for this visit.   Subjective Assessment - 01/22/20 1315    Subjective Patient continues to report some breathing issues with hot humid weather    Currently in Pain?  No/denies                             [facility] Adult PT Treatment/Exercise - 01/22/20 0001      High Level Balance   High Level Balance Activities Side stepping;Backward walking;Negotiating over obstacles      Lumbar Exercises: Stretches   Passive Hamstring Stretch Right;Left;4 reps;20 seconds    Lower Trunk Rotation 4 reps;20 seconds    Piriformis Stretch Right;Left;4 reps;20 seconds      Lumbar Exercises: Aerobic   Recumbent Bike 6 minutes but required multiple rests, tried on level 0 but she has to maintain 45 RPM, she could only do 45 seconds today      Lumbar Exercises: Machines for Strengthening   Cybex Knee Extension 10# 3x10    Cybex Knee Flexion 25# 3x10    Other Lumbar Machine Exercise 20# seated rows and lats      Lumbar Exercises: Seated   Other Seated Lumbar Exercises sit to stand ball toss x 10      Lumbar Exercises: Supine   Other Supine Lumbar Exercises feet on ball K2C, trunk rotation, small bridges and isometric abs                    PT Short Term Goals - 12/04/19 1627      PT SHORT TERM GOAL #1   Title independent with intial HEP    Status Achieved             PT Long Term Goals - 01/22/20 1356      PT LONG TERM GOAL #1   Title increase overall hip LE strength for functional gait and safety hips and ankles to 4+/5    Status On-going      PT LONG TERM GOAL #2   Title decrease TUG time to less than 14 seconds for functional gait and safety    Status Partially Met      PT LONG TERM GOAL #3   Title increase BERG to 50/56 for safety and independence    Status Partially Met      PT LONG TERM GOAL #4   Title decrease LBP 50% with activities    Status Partially Met                 Plan - 01/22/20 1354    Clinical Impression Statement Patient continued to struggle with breathing, her O2 saturation remained about 80% today.  She did have some LOB with backward walking and needed Min A to right self.    PT Next  Visit Plan she does not see the cardiologist for another 3 weeks    Consulted and Agree with Plan of Care Patient  Patient will benefit from skilled therapeutic intervention in order to improve the following deficits and impairments:  Abnormal gait, Decreased range of motion, Difficulty walking, Increased muscle spasms, Decreased activity tolerance, Pain, Decreased balance, Impaired flexibility, Improper body mechanics, Postural dysfunction, Decreased strength, Decreased mobility  Visit Diagnosis: Acute bilateral low back pain without sciatica  Other abnormalities of gait and mobility  Muscle spasm of back  Muscle weakness (generalized)  Risk for falls  Difficulty in walking, not elsewhere classified  Physical debility     Problem List Patient Active Problem List   Diagnosis Date Noted  . Acute cystitis with hematuria 12/19/2019  . Hypomagnesemia 07/11/2017  . Nausea vomiting and diarrhea 07/10/2017  . Hypokalemia 07/10/2017  . Chronic diastolic CHF (congestive heart failure) ([place]) 07/10/2017  . Seizure ([place]) 07/10/2017  . OSA (obstructive sleep apnea) 07/10/2017  . Sepsis ([place]) 07/10/2017  . Seizure disorder ([place]) 07/05/2017  . Pulmonary HTN ([place]) 07/05/2017  . Anxiety state   . Physical debility 06/23/2017  . 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 History of acute respiratory failure   . Tracheostomy, acute management ([place])   . Aspiration pneumonia ([place])   . Encephalopathy   . Status epilepticus ([place])   . Benign essential HTN   . 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 History of pulmonary embolism   . Acute blood loss anemia   . Diverticulitis of colon   . Tachypnea   . Encounter for orogastric (OG) tube placement   . Encounter for central line placement   . Encounter for attention to tracheostomy ([place])   . Acute respiratory failure with hypoxia ([place])   . Acute encephalopathy 06/06/2017  . Diverticulitis of large intestine without  perforation or abscess without bleeding   . Abdominal pain   . Non-intractable cyclical vomiting with nausea   . Abnormal CT of the abdomen   . Hypocalcemia 05/27/2017  . 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 Colitis 05/27/2017  . Central retinal vein occlusion of right eye 04/20/2016  . Nuclear sclerotic cataract of both eyes 04/20/2016  . Diabetic foot infection ([place]) 07/21/2015  . Diabetes mellitus type 2, controlled ([place]) 07/21/2015  . Cellulitis of foot, right 07/21/2015  . Cellulitis of right foot 07/21/2015  . Acute pulmonary embolism ([place]) 04/01/2013  . DVT (deep venous thrombosis) ([place]) 04/01/2013  . HX: breast cancer 04/01/2013  . Concussion 04/01/2013  . Diabetes ([place]) 04/01/2013  . Essential hypertension, benign 04/01/2013  . Hypothyroidism 04/01/2013  . OSA on CPAP 04/01/2013    [name] Boast., PT 01/22/2020, 1:57 PM  [place], [name], [address] Phone: [phone]   Fax:  [phone]  Name: Krystalynn Ridgeway MRN: [id number] Date of Birth: 05-06-1941

## 2020-01-27 ENCOUNTER — Ambulatory Visit: Payer: Medicare PPO | Admitting: Physical Therapy

## 2020-01-27 ENCOUNTER — Other Ambulatory Visit: Payer: Self-pay

## 2020-01-27 ENCOUNTER — Encounter: Payer: Self-pay | Admitting: Physical Therapy

## 2020-01-27 DIAGNOSIS — M62830 Muscle spasm of back: Secondary | ICD-10-CM

## 2020-01-27 DIAGNOSIS — Z9181 History of falling: Secondary | ICD-10-CM

## 2020-01-27 DIAGNOSIS — M545 Low back pain, unspecified: Secondary | ICD-10-CM

## 2020-01-27 DIAGNOSIS — R2689 Other abnormalities of gait and mobility: Secondary | ICD-10-CM

## 2020-01-27 DIAGNOSIS — R262 Difficulty in walking, not elsewhere classified: Secondary | ICD-10-CM

## 2020-01-27 DIAGNOSIS — M6281 Muscle weakness (generalized): Secondary | ICD-10-CM

## 2020-01-27 DIAGNOSIS — R5381 Other malaise: Secondary | ICD-10-CM

## 2020-01-27 NOTE — Therapy (Signed)
[facility] [place], [name], [address] Phone: [phone]   Fax:  [phone]  Physical Therapy Treatment  Patient Details  Name: Amanda Short MRN: [id number] Date of Birth: 09/25/1940 Referring Provider (PT): Rennard   Encounter Date: 01/27/2020   PT End of Session - 01/27/20 1341    Visit Number 15    Number of Visits 20    Date for PT Re-Evaluation 02/16/20    Authorization Type [employer]    PT Start Time 1254    PT Stop Time 1342    PT Time Calculation (min) 48 min    Activity Tolerance Patient limited by fatigue    Behavior During Therapy [facility] for tasks assessed/performed           Past Medical History:  Diagnosis Date  . Abdominal pain   . Abnormal CT of the abdomen   . Acute blood loss anemia   . Acute cystitis with hematuria 12/19/2019  . Acute encephalopathy 06/06/2017  . Acute pulmonary embolism ([place]) 04/01/2013  . Acute respiratory failure with hypoxia ([place])   . Anxiety state   . Arthritis    "back, hands" (07/22/2015)  . Aspiration pneumonia ([place])   . Benign essential HTN   . Cancer of left breast ([place]) 2006   S/P lumpectomy  . Cellulitis of foot, right 07/21/2015  . Cellulitis of right foot 07/21/2015  . Central retinal vein occlusion of right eye 04/20/2016  . Chronic diastolic CHF (congestive heart failure) ([place]) 07/10/2017  . Colitis 05/27/2017  . Complication of anesthesia    "brief breathing problem at surgery center in ~ 2005 when I had gallbladder OR"  . Concussion 03/2013   due to fall  . Depression   . Diabetes ([place]) 04/01/2013  . 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 Diabetes mellitus type 2, controlled ([employer]) 07/21/2015  . Diabetic foot infection ([place]) 07/21/2015  . Diverticulitis of colon   . Diverticulitis of large intestine without perforation or abscess without bleeding   . Diverticulosis   . DVT (deep venous thrombosis) ([place]) 03/2013   LLE  . Encephalopathy    . Encounter for attention to tracheostomy ([place])   . Encounter for central line placement   . Encounter for orogastric (OG) tube placement   . Essential hypertension, benign 04/01/2013  . GERD (gastroesophageal reflux disease)   . History of acute respiratory failure   . History of pulmonary embolism   . HX: breast cancer 04/01/2013  . Hyperlipidemia   . Hypertension   . Hypocalcemia 05/27/2017  . Hypokalemia 07/10/2017  . Hypomagnesemia 07/11/2017  . Hypothyroidism   . Nausea vomiting and diarrhea 07/10/2017  . Non-intractable cyclical vomiting with nausea   . Nuclear sclerotic cataract of both eyes 04/20/2016  . OSA (obstructive sleep apnea) 07/10/2017  . OSA on CPAP 04/01/2013  . OSA treated with BiPAP   . Physical debility 06/23/2017  . 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 Pulmonary embolism ([place]) 03/2013  . Pulmonary HTN ([place]) 07/05/2017  . Seizure ([place]) 07/10/2017  . Seizure disorder ([place]) 07/05/2017  . Seizures ([place])   . Sepsis ([place]) 07/10/2017  . Sleep apnea   . Status epilepticus ([place])   . 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 Tachypnea   . Thyroid disease    Hypothyroid  . Tracheostomy, acute management ([place])   . Type II diabetes mellitus ([place])     Past Surgical History:  Procedure Laterality Date  .  ABDOMINAL HYSTERECTOMY  1970s  . BREAST BIOPSY Left 2006  . BREAST LUMPECTOMY Left 2006  . BUNIONECTOMY WITH HAMMERTOE RECONSTRUCTION Left   . ESOPHAGOGASTRODUODENOSCOPY N/A 05/29/2017   Procedure: ESOPHAGOGASTRODUODENOSCOPY (EGD);  Surgeon: Irene Shipper, MD;  Location: [facility];  Service: Endoscopy;  Laterality: N/A;  . JOINT REPLACEMENT    . LAPAROSCOPIC CHOLECYSTECTOMY  ~ 2005  . TOTAL HIP ARTHROPLASTY Left 2010  . TOTAL KNEE ARTHROPLASTY Bilateral 2005-2006    There were no vitals filed for this visit.   Subjective Assessment - 01/27/20 1301    Subjective Patient has some soreness in the low back due to cleaning at home, reports some heart palpitations  today    Currently in Pain? Yes    Pain Score 4     Pain Location Back    Pain Orientation Lower    Aggravating Factors  cleaning                             [facility] Adult PT Treatment/Exercise - 01/27/20 0001      High Level Balance   High Level Balance Activities Side stepping;Backward walking;Negotiating over obstacles    High Level Balance Comments 6" toe touches with SPC, on firm airxex reaches and then eyes closed, added a 4" step up and dwon to the obstacle course. on airex red tband scapular stabilizatoin      Lumbar Exercises: Aerobic   Recumbent Bike 24mnute 20 seconds on level 0 then had to rest O2 sturation 86%, then 3 minutes with it off      Lumbar Exercises: Machines for Strengthening   Cybex Knee Extension 10# 3x10    Cybex Knee Flexion 25# 3x10      Lumbar Exercises: Seated   Other Seated Lumbar Exercises bosu behind partial crunch, isometric abs                    PT Short Term Goals - 12/04/19 1627      PT SHORT TERM GOAL #1   Title independent with intial HEP    Status Achieved             PT Long Term Goals - 01/22/20 1356      PT LONG TERM GOAL #1   Title increase overall hip LE strength for functional gait and safety hips and ankles to 4+/5    Status On-going      PT LONG TERM GOAL #2   Title decrease TUG time to less than 14 seconds for functional gait and safety    Status Partially Met      PT LONG TERM GOAL #3   Title increase BERG to 50/56 for safety and independence    Status Partially Met      PT LONG TERM GOAL #4   Title decrease LBP 50% with activities    Status Partially Met                 Plan - 01/27/20 1343    Clinical Impression Statement Patient seemed to have higher O2 saturation todau b/n 80-86%.  She did well with the static balance activities, really did not like to do head turns while on the dynamic surface.  She had two instances of LOB requiring max A to prevent fall.  She will get  the results of the EKG next week.    PT Next Visit Plan continue to work on balance to help with safety  Consulted and Agree with Plan of Care Patient           Patient will benefit from skilled therapeutic intervention in order to improve the following deficits and impairments:  Abnormal gait, Decreased range of motion, Difficulty walking, Increased muscle spasms, Decreased activity tolerance, Pain, Decreased balance, Impaired flexibility, Improper body mechanics, Postural dysfunction, Decreased strength, Decreased mobility  Visit Diagnosis: Acute bilateral low back pain without sciatica  Other abnormalities of gait and mobility  Muscle spasm of back  Muscle weakness (generalized)  Risk for falls  Difficulty in walking, not elsewhere classified  Physical debility     Problem List Patient Active Problem List   Diagnosis Date Noted  . Acute cystitis with hematuria 12/19/2019  . Hypomagnesemia 07/11/2017  . Nausea vomiting and diarrhea 07/10/2017  . Hypokalemia 07/10/2017  . Chronic diastolic CHF (congestive heart failure) ([place]) 07/10/2017  . Seizure ([place]) 07/10/2017  . OSA (obstructive sleep apnea) 07/10/2017  . Sepsis ([place]) 07/10/2017  . Seizure disorder ([place]) 07/05/2017  . Pulmonary HTN ([place]) 07/05/2017  . Anxiety state   . Physical debility 06/23/2017  . 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 History of acute respiratory failure   . Tracheostomy, acute management ([place])   . Aspiration pneumonia ([place])   . Encephalopathy   . Status epilepticus ([place])   . Benign essential HTN   . 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 History of pulmonary embolism   . Acute blood loss anemia   . Diverticulitis of colon   . Tachypnea   . Encounter for orogastric (OG) tube placement   . Encounter for central line placement   . Encounter for attention to tracheostomy ([place])   . Acute respiratory failure with hypoxia ([place])   . Acute encephalopathy 06/06/2017  .  Diverticulitis of large intestine without perforation or abscess without bleeding   . Abdominal pain   . Non-intractable cyclical vomiting with nausea   . Abnormal CT of the abdomen   . Hypocalcemia 05/27/2017  . 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 Colitis 05/27/2017  . Central retinal vein occlusion of right eye 04/20/2016  . Nuclear sclerotic cataract of both eyes 04/20/2016  . Diabetic foot infection ([place]) 07/21/2015  . Diabetes mellitus type 2, controlled ([place]) 07/21/2015  . Cellulitis of foot, right 07/21/2015  . Cellulitis of right foot 07/21/2015  . Acute pulmonary embolism ([place]) 04/01/2013  . DVT (deep venous thrombosis) ([place]) 04/01/2013  . HX: breast cancer 04/01/2013  . Concussion 04/01/2013  . Diabetes ([place]) 04/01/2013  . Essential hypertension, benign 04/01/2013  . Hypothyroidism 04/01/2013  . OSA on CPAP 04/01/2013    [name] Boast., PT 01/27/2020, 1:46 PM  [place], [name], [address] Phone: [phone]   Fax:  [phone]  Name: Navi Ewton MRN: [id number] Date of Birth: 26-Apr-1941

## 2020-02-02 ENCOUNTER — Other Ambulatory Visit: Payer: Self-pay

## 2020-02-02 ENCOUNTER — Ambulatory Visit: Payer: Medicare PPO | Admitting: Physical Therapy

## 2020-02-02 ENCOUNTER — Encounter: Payer: Self-pay | Admitting: Physical Therapy

## 2020-02-02 DIAGNOSIS — M6281 Muscle weakness (generalized): Secondary | ICD-10-CM

## 2020-02-02 DIAGNOSIS — R2689 Other abnormalities of gait and mobility: Secondary | ICD-10-CM

## 2020-02-02 DIAGNOSIS — M545 Low back pain, unspecified: Secondary | ICD-10-CM

## 2020-02-02 DIAGNOSIS — Z9181 History of falling: Secondary | ICD-10-CM

## 2020-02-02 DIAGNOSIS — R262 Difficulty in walking, not elsewhere classified: Secondary | ICD-10-CM

## 2020-02-02 DIAGNOSIS — R5381 Other malaise: Secondary | ICD-10-CM

## 2020-02-02 DIAGNOSIS — M62830 Muscle spasm of back: Secondary | ICD-10-CM

## 2020-02-02 NOTE — Therapy (Signed)
[place], [name], [address] Phone: [phone]   Fax:  [phone]  Physical Therapy Treatment  Patient Details  Name: Amanda Short MRN: [id number] Date of Birth: 02-14-1941 Referring Provider (PT): Rennard   Encounter Date: 02/02/2020   PT End of Session - 02/02/20 1707    Visit Number 16    Number of Visits 20    Date for PT Re-Evaluation 02/16/20    Authorization Type [employer]    PT Start Time 1308    PT Stop Time 1355    PT Time Calculation (min) 47 min    Activity Tolerance Patient limited by fatigue    Behavior During Therapy [facility] for tasks assessed/performed           Past Medical History:  Diagnosis Date  . Abdominal pain   . Abnormal CT of the abdomen   . Acute blood loss anemia   . Acute cystitis with hematuria 12/19/2019  . Acute encephalopathy 06/06/2017  . Acute pulmonary embolism ([place]) 04/01/2013  . Acute respiratory failure with hypoxia ([place])   . Anxiety state   . Arthritis    "back, hands" (07/22/2015)  . Aspiration pneumonia ([place])   . Benign essential HTN   . Cancer of left breast ([place]) 2006   S/P lumpectomy  . Cellulitis of foot, right 07/21/2015  . Cellulitis of right foot 07/21/2015  . Central retinal vein occlusion of right eye 04/20/2016  . Chronic diastolic CHF (congestive heart failure) ([place]) 07/10/2017  . Colitis 05/27/2017  . Complication of anesthesia    "brief breathing problem at surgery center in ~ 2005 when I had gallbladder OR"  . Concussion 03/2013   due to fall  . Depression   . Diabetes ([place]) 04/01/2013  . 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 Diabetes mellitus type 2, controlled ([place]) 07/21/2015  . Diabetic foot infection ([place]) 07/21/2015  . Diverticulitis of colon   . Diverticulitis of large intestine without perforation or abscess without bleeding   . Diverticulosis   . DVT (deep venous thrombosis) ([place]) 03/2013   LLE  . Encephalopathy    . Encounter for attention to tracheostomy ([place])   . Encounter for central line placement   . Encounter for orogastric (OG) tube placement   . Essential hypertension, benign 04/01/2013  . GERD (gastroesophageal reflux disease)   . History of acute respiratory failure   . History of pulmonary embolism   . HX: breast cancer 04/01/2013  . Hyperlipidemia   . Hypertension   . Hypocalcemia 05/27/2017  . Hypokalemia 07/10/2017  . Hypomagnesemia 07/11/2017  . Hypothyroidism   . Nausea vomiting and diarrhea 07/10/2017  . Non-intractable cyclical vomiting with nausea   . Nuclear sclerotic cataract of both eyes 04/20/2016  . OSA (obstructive sleep apnea) 07/10/2017  . OSA on CPAP 04/01/2013  . OSA treated with BiPAP   . Physical debility 06/23/2017  . 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 Pulmonary embolism ([place]) 03/2013  . Pulmonary HTN ([place]) 07/05/2017  . Seizure ([place]) 07/10/2017  . Seizure disorder ([place]) 07/05/2017  . Seizures ([place])   . Sepsis ([place]) 07/10/2017  . Sleep apnea   . Status epilepticus ([place])   . 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 Tachypnea   . Thyroid disease    Hypothyroid  . Tracheostomy, acute management ([place])   . Type II diabetes mellitus ([place])     Past Surgical History:  Procedure Laterality Date  .  ABDOMINAL HYSTERECTOMY  1970s  . BREAST BIOPSY Left 2006  . BREAST LUMPECTOMY Left 2006  . BUNIONECTOMY WITH HAMMERTOE RECONSTRUCTION Left   . ESOPHAGOGASTRODUODENOSCOPY N/A 05/29/2017   Procedure: ESOPHAGOGASTRODUODENOSCOPY (EGD);  Surgeon: Irene Shipper, MD;  Location: [facility];  Service: Endoscopy;  Laterality: N/A;  . JOINT REPLACEMENT    . LAPAROSCOPIC CHOLECYSTECTOMY  ~ 2005  . TOTAL HIP ARTHROPLASTY Left 2010  . TOTAL KNEE ARTHROPLASTY Bilateral 2005-2006    There were no vitals filed for this visit.   Subjective Assessment - 02/02/20 1310    Subjective Patinet reports that she has a test on Friday, sees the MD August 2nd    Currently in Pain? Yes     Pain Score 2     Pain Location Back    Pain Orientation Lower                             [facility] Adult PT Treatment/Exercise - 02/02/20 0001      Ambulation/Gait   Gait Comments gait to car working on speed and not stopping      High Level Balance   High Level Balance Activities Tandem walking;Backward walking    High Level Balance Comments resisted gait side stepping, 6" toe touches with SPC and then while on firm airex, side stepping over a small object      Lumbar Exercises: Aerobic   Recumbent Bike 5 minutes      Lumbar Exercises: Machines for Strengthening   Cybex Knee Extension 10# 3x10    Cybex Knee Flexion 25# 3x10      Lumbar Exercises: Standing   Row Both;Theraband;20 reps    Theraband Level (Row) Level 2 (Red)    Row Limitations on airex    Shoulder Extension Both;20 reps;Theraband    Theraband Level (Shoulder Extension) Level 2 (Red)    Shoulder Extension Limitations on airex    Other Standing Lumbar Exercises 2.5# hip marching, abduction and extension 10x each leg                    PT Short Term Goals - 12/04/19 1627      PT SHORT TERM GOAL #1   Title independent with intial HEP    Status Achieved             PT Long Term Goals - 01/22/20 1356      PT LONG TERM GOAL #1   Title increase overall hip LE strength for functional gait and safety hips and ankles to 4+/5    Status On-going      PT LONG TERM GOAL #2   Title decrease TUG time to less than 14 seconds for functional gait and safety    Status Partially Met      PT LONG TERM GOAL #3   Title increase BERG to 50/56 for safety and independence    Status Partially Met      PT LONG TERM GOAL #4   Title decrease LBP 50% with activities    Status Partially Met                 Plan - 02/02/20 1707    Clinical Impression Statement Patient did better with balance activities, her O2 saturation was 80-88% today throughout the session, she did require a few rest  breaks due to shortness of breath.  Tried some resisted gait which is new, needed close CGA for safety  PT Next Visit Plan continue to work on balance to help with safety    Consulted and Agree with Plan of Care Patient           Patient will benefit from skilled therapeutic intervention in order to improve the following deficits and impairments:  Abnormal gait, Decreased range of motion, Difficulty walking, Increased muscle spasms, Decreased activity tolerance, Pain, Decreased balance, Impaired flexibility, Improper body mechanics, Postural dysfunction, Decreased strength, Decreased mobility  Visit Diagnosis: Acute bilateral low back pain without sciatica  Other abnormalities of gait and mobility  Muscle spasm of back  Muscle weakness (generalized)  Risk for falls  Difficulty in walking, not elsewhere classified  Physical debility     Problem List Patient Active Problem List   Diagnosis Date Noted  . Acute cystitis with hematuria 12/19/2019  . Hypomagnesemia 07/11/2017  . Nausea vomiting and diarrhea 07/10/2017  . Hypokalemia 07/10/2017  . Chronic diastolic CHF (congestive heart failure) ([place]) 07/10/2017  . Seizure ([place]) 07/10/2017  . OSA (obstructive sleep apnea) 07/10/2017  . Sepsis ([place]) 07/10/2017  . Seizure disorder ([place]) 07/05/2017  . Pulmonary HTN ([place]) 07/05/2017  . Anxiety state   . Physical debility 06/23/2017  . 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 History of acute respiratory failure   . Tracheostomy, acute management ([place])   . Aspiration pneumonia ([place])   . Encephalopathy   . Status epilepticus ([place])   . Benign essential HTN   . 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 History of pulmonary embolism   . Acute blood loss anemia   . Diverticulitis of colon   . Tachypnea   . Encounter for orogastric (OG) tube placement   . Encounter for central line placement   . Encounter for attention to tracheostomy ([place])   . Acute respiratory failure  with hypoxia ([place])   . Acute encephalopathy 06/06/2017  . Diverticulitis of large intestine without perforation or abscess without bleeding   . Abdominal pain   . Non-intractable cyclical vomiting with nausea   . Abnormal CT of the abdomen   . Hypocalcemia 05/27/2017  . 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 Colitis 05/27/2017  . Central retinal vein occlusion of right eye 04/20/2016  . Nuclear sclerotic cataract of both eyes 04/20/2016  . Diabetic foot infection ([place]) 07/21/2015  . Diabetes mellitus type 2, controlled ([place]) 07/21/2015  . Cellulitis of foot, right 07/21/2015  . Cellulitis of right foot 07/21/2015  . Acute pulmonary embolism ([place]) 04/01/2013  . DVT (deep venous thrombosis) ([place]) 04/01/2013  . HX: breast cancer 04/01/2013  . Concussion 04/01/2013  . Diabetes ([place]) 04/01/2013  . Essential hypertension, benign 04/01/2013  . Hypothyroidism 04/01/2013  . OSA on CPAP 04/01/2013    [name] Boast., PT 02/02/2020, 5:15 PM  [place], [name], [address] Phone: [phone]   Fax:  [phone]  Name: Amanda Short MRN: [id number] Date of Birth: [birth year]

## 2020-02-04 ENCOUNTER — Ambulatory Visit: Payer: Medicare PPO | Admitting: Physical Therapy

## 2020-02-04 ENCOUNTER — Encounter: Payer: Self-pay | Admitting: Physical Therapy

## 2020-02-04 ENCOUNTER — Other Ambulatory Visit: Payer: Self-pay

## 2020-02-04 DIAGNOSIS — M545 Low back pain, unspecified: Secondary | ICD-10-CM

## 2020-02-04 DIAGNOSIS — Z9181 History of falling: Secondary | ICD-10-CM

## 2020-02-04 DIAGNOSIS — R5381 Other malaise: Secondary | ICD-10-CM

## 2020-02-04 DIAGNOSIS — R262 Difficulty in walking, not elsewhere classified: Secondary | ICD-10-CM

## 2020-02-04 DIAGNOSIS — M6281 Muscle weakness (generalized): Secondary | ICD-10-CM

## 2020-02-04 DIAGNOSIS — R2689 Other abnormalities of gait and mobility: Secondary | ICD-10-CM

## 2020-02-04 DIAGNOSIS — M62830 Muscle spasm of back: Secondary | ICD-10-CM

## 2020-02-04 NOTE — Therapy (Signed)
[place], [name], [address] Phone: [phone]   Fax:  [phone]  Physical Therapy Treatment  Patient Details  Name: Amanda Short MRN: [id number] Date of Birth: January 12, 1941 Referring Provider (PT): Rennard   Encounter Date: 02/04/2020   PT End of Session - 02/04/20 2878    Visit Number 17    Number of Visits 20    Date for PT Re-Evaluation 02/16/20    Authorization Type [employer]    PT Start Time 6767    PT Stop Time 1350    PT Time Calculation (min) 45 min    Activity Tolerance Patient tolerated treatment well    Behavior During Therapy [facility] for tasks assessed/performed           Past Medical History:  Diagnosis Date  . Abdominal pain   . Abnormal CT of the abdomen   . Acute blood loss anemia   . Acute cystitis with hematuria 12/19/2019  . Acute encephalopathy 06/06/2017  . Acute pulmonary embolism ([place]) 04/01/2013  . Acute respiratory failure with hypoxia ([place])   . Anxiety state   . Arthritis    "back, hands" (07/22/2015)  . Aspiration pneumonia ([place])   . Benign essential HTN   . Cancer of left breast ([place]) 2006   S/P lumpectomy  . Cellulitis of foot, right 07/21/2015  . Cellulitis of right foot 07/21/2015  . Central retinal vein occlusion of right eye 04/20/2016  . Chronic diastolic CHF (congestive heart failure) ([place]) 07/10/2017  . Colitis 05/27/2017  . Complication of anesthesia    "brief breathing problem at surgery center in ~ 2005 when I had gallbladder OR"  . Concussion 03/2013   due to fall  . Depression   . Diabetes ([place]) 04/01/2013  . 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 Diabetes mellitus type 2, controlled ([place]) 07/21/2015  . Diabetic foot infection ([place]) 07/21/2015  . Diverticulitis of colon   . Diverticulitis of large intestine without perforation or abscess without bleeding   . Diverticulosis   . DVT (deep venous thrombosis) ([place]) 03/2013   LLE  .  Encephalopathy   . Encounter for attention to tracheostomy ([place])   . Encounter for central line placement   . Encounter for orogastric (OG) tube placement   . Essential hypertension, benign 04/01/2013  . GERD (gastroesophageal reflux disease)   . History of acute respiratory failure   . History of pulmonary embolism   . HX: breast cancer 04/01/2013  . Hyperlipidemia   . Hypertension   . Hypocalcemia 05/27/2017  . Hypokalemia 07/10/2017  . Hypomagnesemia 07/11/2017  . Hypothyroidism   . Nausea vomiting and diarrhea 07/10/2017  . Non-intractable cyclical vomiting with nausea   . Nuclear sclerotic cataract of both eyes 04/20/2016  . OSA (obstructive sleep apnea) 07/10/2017  . OSA on CPAP 04/01/2013  . OSA treated with BiPAP   . Physical debility 06/23/2017  . 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 Pulmonary embolism ([place]) 03/2013  . Pulmonary HTN ([place]) 07/05/2017  . Seizure ([place]) 07/10/2017  . Seizure disorder ([place]) 07/05/2017  . Seizures ([place])   . Sepsis ([place]) 07/10/2017  . Sleep apnea   . Status epilepticus ([place])   . 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 Tachypnea   . Thyroid disease    Hypothyroid  . Tracheostomy, acute management ([place])   . Type II diabetes mellitus ([place])     Past Surgical History:  Procedure Laterality Date  .  ABDOMINAL HYSTERECTOMY  1970s  . BREAST BIOPSY Left 2006  . BREAST LUMPECTOMY Left 2006  . BUNIONECTOMY WITH HAMMERTOE RECONSTRUCTION Left   . ESOPHAGOGASTRODUODENOSCOPY N/A 05/29/2017   Procedure: ESOPHAGOGASTRODUODENOSCOPY (EGD);  Surgeon: Irene Shipper, MD;  Location: [facility];  Service: Endoscopy;  Laterality: N/A;  . JOINT REPLACEMENT    . LAPAROSCOPIC CHOLECYSTECTOMY  ~ 2005  . TOTAL HIP ARTHROPLASTY Left 2010  . TOTAL KNEE ARTHROPLASTY Bilateral 2005-2006    There were no vitals filed for this visit.   Subjective Assessment - 02/04/20 1314    Subjective Patient reports that she has some back pain "bended wrong due to putting on  clothes"    Currently in Pain? Yes    Pain Score 4     Pain Location Back    Pain Orientation Lower    Pain Descriptors / Indicators Sore;Aching    Aggravating Factors  getting dressed this AM                             [facility] Adult PT Treatment/Exercise - 02/04/20 0001      High Level Balance   High Level Balance Activities Negotiating over obstacles;Side stepping    High Level Balance Comments resisted gait side stepping, 6" toe touches with SPC and then while on firm airex, side stepping over a small object, on airex red tband rows and extensions, toe walking, heel walking      Lumbar Exercises: Aerobic   Recumbent Bike 6 minutes 2 1 minute periods wiht it on level 0 maintaining 45 RPMs      Lumbar Exercises: Machines for Strengthening   Other Lumbar Machine Exercise 20# seated rows and lats    Other Lumbar Machine Exercise 5# straight arm pulls with cues for core activation and posture      Lumbar Exercises: Standing   Other Standing Lumbar Exercises sit to stand werighted ball reach out                    PT Short Term Goals - 12/04/19 1627      PT SHORT TERM GOAL #1   Title independent with intial HEP    Status Achieved             PT Long Term Goals - 02/04/20 1349      PT LONG TERM GOAL #1   Title increase overall hip LE strength for functional gait and safety hips and ankles to 4+/5    Status On-going      PT LONG TERM GOAL #2   Title decrease TUG time to less than 14 seconds for functional gait and safety    Status Partially Met      PT LONG TERM GOAL #3   Title increase BERG to 50/56 for safety and independence    Status Partially Met      PT LONG TERM GOAL #4   Title decrease LBP 50% with activities    Status Partially Met                 Plan - 02/04/20 1347    Clinical Impression Statement I feel like patient is doing much better with her tolerance to activity, the last three sessions she has been able to  have O2 saturation 82-88%, she reports that she is really working on the pursed lipped breathing at home and with any exertion.    PT Next Visit Plan continue to  work on balance to help with safety    Consulted and Agree with Plan of Care Patient           Patient will benefit from skilled therapeutic intervention in order to improve the following deficits and impairments:  Abnormal gait, Decreased range of motion, Difficulty walking, Increased muscle spasms, Decreased activity tolerance, Pain, Decreased balance, Impaired flexibility, Improper body mechanics, Postural dysfunction, Decreased strength, Decreased mobility  Visit Diagnosis: Acute bilateral low back pain without sciatica  Other abnormalities of gait and mobility  Muscle spasm of back  Muscle weakness (generalized)  Risk for falls  Difficulty in walking, not elsewhere classified  Physical debility     Problem List Patient Active Problem List   Diagnosis Date Noted  . Acute cystitis with hematuria 12/19/2019  . Hypomagnesemia 07/11/2017  . Nausea vomiting and diarrhea 07/10/2017  . Hypokalemia 07/10/2017  . Chronic diastolic CHF (congestive heart failure) ([place]) 07/10/2017  . Seizure ([place]) 07/10/2017  . OSA (obstructive sleep apnea) 07/10/2017  . Sepsis ([place]) 07/10/2017  . Seizure disorder ([place]) 07/05/2017  . Pulmonary HTN ([place]) 07/05/2017  . Anxiety state   . Physical debility 06/23/2017  . 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 History of acute respiratory failure   . Tracheostomy, acute management ([place])   . Aspiration pneumonia ([place])   . Encephalopathy   . Status epilepticus ([place])   . Benign essential HTN   . 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 History of pulmonary embolism   . Acute blood loss anemia   . Diverticulitis of colon   . Tachypnea   . Encounter for orogastric (OG) tube placement   . Encounter for central line placement   . Encounter for attention to tracheostomy ([place])   .  Acute respiratory failure with hypoxia ([place])   . Acute encephalopathy 06/06/2017  . Diverticulitis of large intestine without perforation or abscess without bleeding   . Abdominal pain   . Non-intractable cyclical vomiting with nausea   . Abnormal CT of the abdomen   . Hypocalcemia 05/27/2017  . 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 Colitis 05/27/2017  . Central retinal vein occlusion of right eye 04/20/2016  . Nuclear sclerotic cataract of both eyes 04/20/2016  . Diabetic foot infection ([place]) 07/21/2015  . Diabetes mellitus type 2, controlled ([place]) 07/21/2015  . Cellulitis of foot, right 07/21/2015  . Cellulitis of right foot 07/21/2015  . Acute pulmonary embolism ([place]) 04/01/2013  . DVT (deep venous thrombosis) ([place]) 04/01/2013  . HX: breast cancer 04/01/2013  . Concussion 04/01/2013  . Diabetes ([place]) 04/01/2013  . Essential hypertension, benign 04/01/2013  . Hypothyroidism 04/01/2013  . OSA on CPAP 04/01/2013    [name] Boast., PT 02/04/2020, 1:49 PM  [place], [name], [address] Phone: [phone]   Fax:  [phone]  Name: Amanda Short MRN: [id number] Date of Birth: September 03, 1940

## 2020-02-06 ENCOUNTER — Other Ambulatory Visit: Payer: Self-pay

## 2020-02-06 ENCOUNTER — Ambulatory Visit (HOSPITAL_BASED_OUTPATIENT_CLINIC_OR_DEPARTMENT_OTHER)
Admission: RE | Admit: 2020-02-06 | Discharge: 2020-02-06 | Disposition: A | Discharge status: Home / Self Care | Payer: Medicare PPO | Source: Ambulatory Visit | Attending: Cardiology | Admitting: Cardiology

## 2020-02-06 DIAGNOSIS — I272 Pulmonary hypertension, unspecified: Secondary | ICD-10-CM | POA: Insufficient documentation

## 2020-02-06 LAB — ECHOCARDIOGRAM COMPLETE
Area-P 1/2: 4.52 cm2
S' Lateral: 1.71 cm

## 2020-02-09 ENCOUNTER — Ambulatory Visit: Payer: Medicare PPO | Admitting: Physical Therapy

## 2020-02-09 ENCOUNTER — Other Ambulatory Visit: Payer: Self-pay

## 2020-02-09 ENCOUNTER — Encounter: Payer: Self-pay | Admitting: Physical Therapy

## 2020-02-09 DIAGNOSIS — R2689 Other abnormalities of gait and mobility: Secondary | ICD-10-CM

## 2020-02-09 DIAGNOSIS — R5381 Other malaise: Secondary | ICD-10-CM

## 2020-02-09 DIAGNOSIS — M545 Low back pain, unspecified: Secondary | ICD-10-CM

## 2020-02-09 DIAGNOSIS — Z9181 History of falling: Secondary | ICD-10-CM

## 2020-02-09 DIAGNOSIS — R262 Difficulty in walking, not elsewhere classified: Secondary | ICD-10-CM

## 2020-02-09 DIAGNOSIS — M62830 Muscle spasm of back: Secondary | ICD-10-CM

## 2020-02-09 DIAGNOSIS — M6281 Muscle weakness (generalized): Secondary | ICD-10-CM

## 2020-02-09 NOTE — Therapy (Signed)
[place], [name], [address] Phone: [phone]   Fax:  [phone]  Physical Therapy Treatment  Patient Details  Name: Amanda Short MRN: [id number] Date of Birth: [birth year] Referring Provider (PT): Rennard   Encounter Date: 02/09/2020   PT End of Session - 02/09/20 1351    Visit Number 18    Number of Visits 20    Date for PT Re-Evaluation 02/16/20    Authorization Type [employer]    PT Start Time 1302    PT Stop Time 1350    PT Time Calculation (min) 48 min    Activity Tolerance Patient limited by fatigue    Behavior During Therapy [facility] for tasks assessed/performed           Past Medical History:  Diagnosis Date  . Abdominal pain   . Abnormal CT of the abdomen   . Acute blood loss anemia   . Acute cystitis with hematuria 12/19/2019  . Acute encephalopathy 06/06/2017  . Acute pulmonary embolism ([place]) 04/01/2013  . Acute respiratory failure with hypoxia ([place])   . Anxiety state   . Arthritis    "back, hands" (07/22/2015)  . Aspiration pneumonia ([place])   . Benign essential HTN   . Cancer of left breast ([place]) 2006   S/P lumpectomy  . Cellulitis of foot, right 07/21/2015  . Cellulitis of right foot 07/21/2015  . Central retinal vein occlusion of right eye 04/20/2016  . Chronic diastolic CHF (congestive heart failure) ([place]) 07/10/2017  . Colitis 05/27/2017  . Complication of anesthesia    "brief breathing problem at surgery center in ~ 2005 when I had gallbladder OR"  . Concussion 03/2013   due to fall  . Depression   . Diabetes ([place]) 04/01/2013  . 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 Diabetes mellitus type 2, controlled ([place]) 07/21/2015  . Diabetic foot infection ([place]) 07/21/2015  . Diverticulitis of colon   . Diverticulitis of large intestine without perforation or abscess without bleeding   . Diverticulosis   . DVT (deep venous thrombosis) ([place]) 03/2013   LLE  . Encephalopathy    . Encounter for attention to tracheostomy ([place])   . Encounter for central line placement   . Encounter for orogastric (OG) tube placement   . Essential hypertension, benign 04/01/2013  . GERD (gastroesophageal reflux disease)   . History of acute respiratory failure   . History of pulmonary embolism   . HX: breast cancer 04/01/2013  . Hyperlipidemia   . Hypertension   . Hypocalcemia 05/27/2017  . Hypokalemia 07/10/2017  . Hypomagnesemia 07/11/2017  . Hypothyroidism   . Nausea vomiting and diarrhea 07/10/2017  . Non-intractable cyclical vomiting with nausea   . Nuclear sclerotic cataract of both eyes 04/20/2016  . OSA (obstructive sleep apnea) 07/10/2017  . OSA on CPAP 04/01/2013  . OSA treated with BiPAP   . Physical debility 06/23/2017  . 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 Pulmonary embolism ([place]) 03/2013  . Pulmonary HTN ([place]) 07/05/2017  . Seizure ([place]) 07/10/2017  . Seizure disorder ([place]) 07/05/2017  . Seizures ([place])   . Sepsis ([place]) 07/10/2017  . Sleep apnea   . Status epilepticus ([place])   . 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 Tachypnea   . Thyroid disease    Hypothyroid  . Tracheostomy, acute management ([place])   . Type II diabetes mellitus ([place])     Past Surgical History:  Procedure Laterality Date  .  ABDOMINAL HYSTERECTOMY  1970s  . BREAST BIOPSY Left 2006  . BREAST LUMPECTOMY Left 2006  . BUNIONECTOMY WITH HAMMERTOE RECONSTRUCTION Left   . ESOPHAGOGASTRODUODENOSCOPY N/A 05/29/2017   Procedure: ESOPHAGOGASTRODUODENOSCOPY (EGD);  Surgeon: Irene Shipper, MD;  Location: [facility];  Service: Endoscopy;  Laterality: N/A;  . JOINT REPLACEMENT    . LAPAROSCOPIC CHOLECYSTECTOMY  ~ 2005  . TOTAL HIP ARTHROPLASTY Left 2010  . TOTAL KNEE ARTHROPLASTY Bilateral 2005-2006    There were no vitals filed for this visit.   Subjective Assessment - 02/09/20 1304    Subjective Reports that she has diffiuclty with breathing today with the humidity.    Currently in Pain? Yes     Pain Score 3     Pain Location Back    Pain Orientation Lower    Pain Descriptors / Indicators Sore    Aggravating Factors  lifting                             [facility] Adult PT Treatment/Exercise - 02/09/20 0001      High Level Balance   High Level Balance Comments green tband rows and extensions on aires, on ariex 2.5# marches and hip abduction, ball toss, ball kicks, side step over dowl and fwd and bkwd step over dowel, walking ball toss      Lumbar Exercises: Aerobic   Nustep level 5 x 6 minutes      Lumbar Exercises: Machines for Strengthening   Cybex Knee Extension 10# 3x10    Cybex Knee Flexion 25# 3x10    Leg Press 20# 3x10    Other Lumbar Machine Exercise 5# straight arm pulls with cues for core activation and posture, 5# AR press                    PT Short Term Goals - 12/04/19 1627      PT SHORT TERM GOAL #1   Title independent with intial HEP    Status Achieved             PT Long Term Goals - 02/04/20 1349      PT LONG TERM GOAL #1   Title increase overall hip LE strength for functional gait and safety hips and ankles to 4+/5    Status On-going      PT LONG TERM GOAL #2   Title decrease TUG time to less than 14 seconds for functional gait and safety    Status Partially Met      PT LONG TERM GOAL #3   Title increase BERG to 50/56 for safety and independence    Status Partially Met      PT LONG TERM GOAL #4   Title decrease LBP 50% with activities    Status Partially Met                 Plan - 02/09/20 1352    Clinical Impression Statement Patient has been able to do better with the O2 saturation as she is doing more correct breathing, she was a little more short of breath and fatigued today due to poor air quality.  I added AR press today and some standing on airex marching and hip abduction.    PT Next Visit Plan she will get results of the EKG next week    Consulted and Agree with Plan of Care Patient            Patient will  benefit from skilled therapeutic intervention in order to improve the following deficits and impairments:  Abnormal gait, Decreased range of motion, Difficulty walking, Increased muscle spasms, Decreased activity tolerance, Pain, Decreased balance, Impaired flexibility, Improper body mechanics, Postural dysfunction, Decreased strength, Decreased mobility  Visit Diagnosis: Acute bilateral low back pain without sciatica  Other abnormalities of gait and mobility  Muscle spasm of back  Muscle weakness (generalized)  Risk for falls  Difficulty in walking, not elsewhere classified  Physical debility     Problem List Patient Active Problem List   Diagnosis Date Noted  . Acute cystitis with hematuria 12/19/2019  . Hypomagnesemia 07/11/2017  . Nausea vomiting and diarrhea 07/10/2017  . Hypokalemia 07/10/2017  . Chronic diastolic CHF (congestive heart failure) ([place]) 07/10/2017  . Seizure ([place]) 07/10/2017  . OSA (obstructive sleep apnea) 07/10/2017  . Sepsis ([place]) 07/10/2017  . Seizure disorder ([place]) 07/05/2017  . Pulmonary HTN ([place]) 07/05/2017  . Anxiety state   . Physical debility 06/23/2017  . 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 History of acute respiratory failure   . Tracheostomy, acute management ([place])   . Aspiration pneumonia ([place])   . Encephalopathy   . Status epilepticus ([place])   . Benign essential HTN   . 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 History of pulmonary embolism   . Acute blood loss anemia   . Diverticulitis of colon   . Tachypnea   . Encounter for orogastric (OG) tube placement   . Encounter for central line placement   . Encounter for attention to tracheostomy ([place])   . Acute respiratory failure with hypoxia ([place])   . Acute encephalopathy 06/06/2017  . Diverticulitis of large intestine without perforation or abscess without bleeding   . Abdominal pain   . Non-intractable cyclical vomiting with nausea   . Abnormal CT  of the abdomen   . Hypocalcemia 05/27/2017  . 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 Colitis 05/27/2017  . Central retinal vein occlusion of right eye 04/20/2016  . Nuclear sclerotic cataract of both eyes 04/20/2016  . Diabetic foot infection ([place]) 07/21/2015  . Diabetes mellitus type 2, controlled ([place]) 07/21/2015  . Cellulitis of foot, right 07/21/2015  . Cellulitis of right foot 07/21/2015  . Acute pulmonary embolism ([place]) 04/01/2013  . DVT (deep venous thrombosis) ([place]) 04/01/2013  . HX: breast cancer 04/01/2013  . Concussion 04/01/2013  . Diabetes ([place]) 04/01/2013  . Essential hypertension, benign 04/01/2013  . Hypothyroidism 04/01/2013  . OSA on CPAP 04/01/2013    [name] Boast., PT 02/09/2020, 1:54 PM  [place], [name], [address] Phone: [phone]   Fax:  [phone]  Name: Tigerlily Christine MRN: [id number] Date of Birth: June 02, 1941

## 2020-02-11 ENCOUNTER — Encounter: Payer: Self-pay | Admitting: Physical Therapy

## 2020-02-11 ENCOUNTER — Ambulatory Visit: Payer: Medicare PPO | Admitting: Physical Therapy

## 2020-02-11 ENCOUNTER — Other Ambulatory Visit: Payer: Self-pay

## 2020-02-11 DIAGNOSIS — R262 Difficulty in walking, not elsewhere classified: Secondary | ICD-10-CM

## 2020-02-11 DIAGNOSIS — M545 Low back pain, unspecified: Secondary | ICD-10-CM

## 2020-02-11 DIAGNOSIS — M62830 Muscle spasm of back: Secondary | ICD-10-CM

## 2020-02-11 DIAGNOSIS — M6281 Muscle weakness (generalized): Secondary | ICD-10-CM

## 2020-02-11 DIAGNOSIS — R5381 Other malaise: Secondary | ICD-10-CM

## 2020-02-11 DIAGNOSIS — R2689 Other abnormalities of gait and mobility: Secondary | ICD-10-CM

## 2020-02-11 DIAGNOSIS — Z9181 History of falling: Secondary | ICD-10-CM

## 2020-02-11 NOTE — Therapy (Signed)
Amanda Short, [name], [address] Phone: [phone]   Fax:  [phone]  Physical Therapy Treatment  Patient Details  Name: Amanda Short MRN: [id number] Date of Birth: [birth year] Referring Provider (PT): Rennard   Encounter Date: 02/11/2020   PT End of Session - 02/11/20 1346    Visit Number 19    Number of Visits 20    Date for PT Re-Evaluation 02/16/20    Authorization Type [employer]    PT Start Time 1302    PT Stop Time 1348    PT Time Calculation (min) 46 min    Activity Tolerance Patient tolerated treatment well    Behavior During Therapy [facility] for tasks assessed/performed           Past Medical History:  Diagnosis Date  . Abdominal pain   . Abnormal CT of the abdomen   . Acute blood loss anemia   . Acute cystitis with hematuria 12/19/2019  . Acute encephalopathy 06/06/2017  . Acute pulmonary embolism ([place]) 04/01/2013  . Acute respiratory failure with hypoxia ([place])   . Anxiety state   . Arthritis    "back, hands" (07/22/2015)  . Aspiration pneumonia ([place])   . Benign essential HTN   . Cancer of left breast ([place]) 2006   S/P lumpectomy  . Cellulitis of foot, right 07/21/2015  . Cellulitis of right foot 07/21/2015  . Central retinal vein occlusion of right eye 04/20/2016  . Chronic diastolic CHF (congestive heart failure) ([place]) 07/10/2017  . Colitis 05/27/2017  . Complication of anesthesia    "brief breathing problem at surgery center in ~ 2005 when I had gallbladder OR"  . Concussion 03/2013   due to fall  . Depression   . Diabetes ([place]) 04/01/2013  . 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 Diabetes mellitus type 2, controlled ([place]) 07/21/2015  . Diabetic foot infection ([place]) 07/21/2015  . Diverticulitis of colon   . Diverticulitis of large intestine without perforation or abscess without bleeding   . Diverticulosis   . DVT (deep venous thrombosis) ([place]) 03/2013   LLE  .  Encephalopathy   . Encounter for attention to tracheostomy ([place])   . Encounter for central line placement   . Encounter for orogastric (OG) tube placement   . Essential hypertension, benign 04/01/2013  . GERD (gastroesophageal reflux disease)   . History of acute respiratory failure   . History of pulmonary embolism   . HX: breast cancer 04/01/2013  . Hyperlipidemia   . Hypertension   . Hypocalcemia 05/27/2017  . Hypokalemia 07/10/2017  . Hypomagnesemia 07/11/2017  . Hypothyroidism   . Nausea vomiting and diarrhea 07/10/2017  . Non-intractable cyclical vomiting with nausea   . Nuclear sclerotic cataract of both eyes 04/20/2016  . OSA (obstructive sleep apnea) 07/10/2017  . OSA on CPAP 04/01/2013  . OSA treated with BiPAP   . Physical debility 06/23/2017  . 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 Pulmonary embolism ([place]) 03/2013  . Pulmonary HTN ([place]) 07/05/2017  . Seizure ([place]) 07/10/2017  . Seizure disorder ([place]) 07/05/2017  . Seizures ([place])   . Sepsis ([place]) 07/10/2017  . Sleep apnea   . Status epilepticus ([place])   . 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 Tachypnea   . Thyroid disease    Hypothyroid  . Tracheostomy, acute management ([place])   . Type II diabetes mellitus ([place])     Past Surgical History:  Procedure Laterality Date  .  ABDOMINAL HYSTERECTOMY  1970s  . BREAST BIOPSY Left 2006  . BREAST LUMPECTOMY Left 2006  . BUNIONECTOMY WITH HAMMERTOE RECONSTRUCTION Left   . ESOPHAGOGASTRODUODENOSCOPY N/A 05/29/2017   Procedure: ESOPHAGOGASTRODUODENOSCOPY (EGD);  Surgeon: Irene Shipper, MD;  Location: [facility];  Service: Endoscopy;  Laterality: N/A;  . JOINT REPLACEMENT    . LAPAROSCOPIC CHOLECYSTECTOMY  ~ 2005  . TOTAL HIP ARTHROPLASTY Left 2010  . TOTAL KNEE ARTHROPLASTY Bilateral 2005-2006    There were no vitals filed for this visit.   Subjective Assessment - 02/11/20 1305    Subjective Still difficulty with teh heat and humidity    Currently in Pain? No/denies                              [facility] Adult PT Treatment/Exercise - 02/11/20 0001      High Level Balance   High Level Balance Activities Side stepping;Backward walking    High Level Balance Comments ball toss solid and dynamic surfaces, on airex eyes closed, head turns      Lumbar Exercises: Stretches   Passive Hamstring Stretch Right;Left;4 reps;20 seconds    Single Knee to Chest Stretch Right;Left;4 reps;20 seconds    Lower Trunk Rotation 4 reps;20 seconds    Piriformis Stretch Right;Left;4 reps;20 seconds      Lumbar Exercises: Aerobic   Recumbent Bike 6 minutes 2 1 minute periods wiht it on level 0 maintaining 45 RPMs      Lumbar Exercises: Machines for Strengthening   Cybex Knee Extension 10# 3x10    Cybex Knee Flexion 25# 3x10    Other Lumbar Machine Exercise 5# straight arm pulls with cues for core activation and posture, 5# AR press                    PT Short Term Goals - 12/04/19 1627      PT SHORT TERM GOAL #1   Title independent with intial HEP    Status Achieved             PT Long Term Goals - 02/11/20 1348      PT LONG TERM GOAL #1   Title increase overall hip LE strength for functional gait and safety hips and ankles to 4+/5    Status Partially Met      PT LONG TERM GOAL #2   Title decrease TUG time to less than 14 seconds for functional gait and safety    Status Partially Met                 Plan - 02/11/20 1346    Clinical Impression Statement Over the past two weeks I have not seen her O2 saturation below 82%, it is usually 84-85% which is low but much better than when she started, she had consistent readings in the high 70's.  She is having less back pain.  She will see the cardiologist on Monday, would like to see what the plan is for her treatment and how we can assist her.    PT Next Visit Plan see what the cardiologist says and then will need to renew insurance if we are to continue    Consulted and Agree with  Plan of Care Patient           Patient will benefit from skilled therapeutic intervention in order to improve the following deficits and impairments:  Abnormal gait, Decreased range of motion, Difficulty walking, Increased  muscle spasms, Decreased activity tolerance, Pain, Decreased balance, Impaired flexibility, Improper body mechanics, Postural dysfunction, Decreased strength, Decreased mobility  Visit Diagnosis: Acute bilateral low back pain without sciatica  Other abnormalities of gait and mobility  Muscle spasm of back  Muscle weakness (generalized)  Risk for falls  Difficulty in walking, not elsewhere classified  Physical debility     Problem List Patient Active Problem List   Diagnosis Date Noted  . Acute cystitis with hematuria 12/19/2019  . Hypomagnesemia 07/11/2017  . Nausea vomiting and diarrhea 07/10/2017  . Hypokalemia 07/10/2017  . Chronic diastolic CHF (congestive heart failure) ([place]) 07/10/2017  . Seizure ([place]) 07/10/2017  . OSA (obstructive sleep apnea) 07/10/2017  . Sepsis ([place]) 07/10/2017  . Seizure disorder ([place]) 07/05/2017  . Pulmonary HTN ([place]) 07/05/2017  . Anxiety state   . Physical debility 06/23/2017  . 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 History of acute respiratory failure   . Tracheostomy, acute management ([place])   . Aspiration pneumonia ([place])   . Encephalopathy   . Status epilepticus ([place])   . Benign essential HTN   . 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 History of pulmonary embolism   . Acute blood loss anemia   . Diverticulitis of colon   . Tachypnea   . Encounter for orogastric (OG) tube placement   . Encounter for central line placement   . Encounter for attention to tracheostomy ([place])   . Acute respiratory failure with hypoxia ([place])   . Acute encephalopathy 06/06/2017  . Diverticulitis of large intestine without perforation or abscess without bleeding   . Abdominal pain   . Non-intractable cyclical vomiting  with nausea   . Abnormal CT of the abdomen   . Hypocalcemia 05/27/2017  . 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 Colitis 05/27/2017  . Central retinal vein occlusion of right eye 04/20/2016  . Nuclear sclerotic cataract of both eyes 04/20/2016  . Diabetic foot infection ([place]) 07/21/2015  . Diabetes mellitus type 2, controlled ([place]) 07/21/2015  . Cellulitis of foot, right 07/21/2015  . Cellulitis of right foot 07/21/2015  . Acute pulmonary embolism ([place]) 04/01/2013  . DVT (deep venous thrombosis) ([place]) 04/01/2013  . HX: breast cancer 04/01/2013  . Concussion 04/01/2013  . Diabetes ([place]) 04/01/2013  . Essential hypertension, benign 04/01/2013  . Hypothyroidism 04/01/2013  . OSA on CPAP 04/01/2013    [name] Boast., PT 02/11/2020, 1:49 PM  [place], [name], [address] Phone: [phone]   Fax:  [phone]  Name: Amanda Short MRN: [id number] Date of Birth: Jan 22, 1941

## 2020-02-16 ENCOUNTER — Ambulatory Visit: Payer: Medicare PPO | Admitting: Cardiology

## 2020-02-16 ENCOUNTER — Other Ambulatory Visit: Payer: Self-pay

## 2020-02-16 ENCOUNTER — Encounter: Payer: Self-pay | Admitting: Cardiology

## 2020-02-16 VITALS — BP 140/88 | HR 98 | Ht 61.0 in | Wt 157.0 lb

## 2020-02-16 DIAGNOSIS — I272 Pulmonary hypertension, unspecified: Secondary | ICD-10-CM | POA: Diagnosis not present

## 2020-02-16 DIAGNOSIS — I10 Essential (primary) hypertension: Secondary | ICD-10-CM

## 2020-02-16 DIAGNOSIS — E1122 Type 2 diabetes mellitus with diabetic chronic kidney disease: Secondary | ICD-10-CM

## 2020-02-16 DIAGNOSIS — N182 Chronic kidney disease, stage 2 (mild): Secondary | ICD-10-CM

## 2020-02-16 DIAGNOSIS — G4733 Obstructive sleep apnea (adult) (pediatric): Secondary | ICD-10-CM | POA: Diagnosis not present

## 2020-02-16 DIAGNOSIS — I5032 Chronic diastolic (congestive) heart failure: Secondary | ICD-10-CM

## 2020-02-16 DIAGNOSIS — Z9989 Dependence on other enabling machines and devices: Secondary | ICD-10-CM

## 2020-02-16 NOTE — Patient Instructions (Signed)
Medication Instructions:  Your physician recommends that you continue on your current medications as directed. Please refer to the Current Medication list given to you today.  *If you need a refill on your cardiac medications before your next appointment, please call your pharmacy*   Lab Work: Your physician recommends that you return for lab work today: tsh, cmp, cbc If you have labs (blood work) drawn today and your tests are completely normal, you will receive your results only by:  [place] (if you have MyChart) OR  A paper copy in the mail If you have any lab test that is abnormal or we need to change your treatment, we will call you to review the results.   Testing/Procedures: None   Follow-Up: At [facility], you and your health needs are our priority.  As part of our continuing mission to provide you with exceptional heart care, we have created designated Provider Care Teams.  These Care Teams include your primary Cardiologist (physician) and Advanced Practice Providers (APPs -  Physician Assistants and Nurse Practitioners) who all work together to provide you with the care you need, when you need it.  We recommend signing up for the patient portal called "MyChart".  Sign up information is provided on this After Visit Summary.  MyChart is used to connect with patients for Virtual Visits (Telemedicine).  Patients are able to view lab/test results, encounter notes, upcoming appointments, etc.  Non-urgent messages can be sent to your provider as well.   To learn more about what you can do with MyChart, go to [email or web address].    Your next appointment:   6 week(s)  The format for your next appointment:   In Person  Provider:   Jenne Campus, MD   Other Instructions   Dr. Agustin Cree has referred you to see Dr. Enid Derry for pulmonary hypertension.

## 2020-02-16 NOTE — Progress Notes (Addendum)
Cardiology Office Note:    Date:  02/16/2020   ID:  Amanda Short, DOB [birth year], MRN [id number]  PCP:  Libby Maw, MD  Cardiologist:  Jenne Campus, MD    Referring MD: Libby Maw   No chief complaint on file. Am still short of breath  History of Present Illness:    Amanda Short is a 79 y.o. female with past medical history significant for history of DVT and PE years ago at least 89, she was also told to have pulmonary hypertension at the time.  Additional history includes essential hypertension, diabetes, dyslipidemia.  She is ex-smoker quit smoking 1990.  She comes today to discuss results of her echocardiogram echocardiogram showed preserved left ventricle ejection fraction, there is restrictive pattern transmitral flow however the worst is the fact that she does have pulmonary hypertension with pulmonary pressure of 100 mmHg.  She complained of having shortness of breath.  Her New [place] Heart Association is class III.  She can barely do anything.  She does have CPAP mask with oxygen at night but does not use any oxygen at home.  She does have physical therapy and actually physical therapist was the first who realize there is a problem seeing her oxygen saturation being low.  Her oxygen saturation today is 85%.  Denies having swelling of lower extremities, no paroxysmal nocturnal dyspnea, no chest pain tightness squeezing pressure burning chest.  She does have palpitations have a recent monitor showed some very short nonsustained supraventricular tachycardias.  Past Medical History:  Diagnosis Date  . Abdominal pain   . Abnormal CT of the abdomen   . Acute blood loss anemia   . Acute cystitis with hematuria 12/19/2019  . Acute encephalopathy 06/06/2017  . Acute pulmonary embolism ([place]) 04/01/2013  . Acute respiratory failure with hypoxia ([place])   . Anxiety state   . Arthritis    "back, hands" (07/22/2015)  . Aspiration pneumonia ([place])   . Benign  essential HTN   . Cancer of left breast ([place]) 2006   S/P lumpectomy  . Cellulitis of foot, right 07/21/2015  . Cellulitis of right foot 07/21/2015  . Central retinal vein occlusion of right eye 04/20/2016  . Chronic diastolic CHF (congestive heart failure) ([place]) 07/10/2017  . Colitis 05/27/2017  . Complication of anesthesia    "brief breathing problem at surgery center in ~ 2005 when I had gallbladder OR"  . Concussion 03/2013   due to fall  . Depression   . Diabetes ([place]) 04/01/2013  . 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 Diabetes mellitus type 2, controlled ([place]) 07/21/2015  . Diabetic foot infection ([place]) 07/21/2015  . Diverticulitis of colon   . Diverticulitis of large intestine without perforation or abscess without bleeding   . Diverticulosis   . DVT (deep venous thrombosis) ([place]) 03/2013   LLE  . Encephalopathy   . Encounter for attention to tracheostomy ([place])   . Encounter for central line placement   . Encounter for orogastric (OG) tube placement   . Essential hypertension, benign 04/01/2013  . GERD (gastroesophageal reflux disease)   . History of acute respiratory failure   . History of pulmonary embolism   . HX: breast cancer 04/01/2013  . Hyperlipidemia   . Hypertension   . Hypocalcemia 05/27/2017  . Hypokalemia 07/10/2017  . Hypomagnesemia 07/11/2017  . Hypothyroidism   . Nausea vomiting and diarrhea 07/10/2017  . Non-intractable cyclical vomiting with nausea   . Nuclear sclerotic cataract of both  eyes 04/20/2016  . OSA (obstructive sleep apnea) 07/10/2017  . OSA on CPAP 04/01/2013  . OSA treated with BiPAP   . Physical debility 06/23/2017  . 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 Pulmonary embolism ([place]) 03/2013  . Pulmonary HTN ([place]) 07/05/2017  . Seizure ([place]) 07/10/2017  . Seizure disorder ([place]) 07/05/2017  . Seizures ([place])   . Sepsis ([place]) 07/10/2017  . Sleep apnea   . Status epilepticus ([place])   . 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 Tachypnea   .  Thyroid disease    Hypothyroid  . Tracheostomy, acute management ([place])   . Type II diabetes mellitus ([place])     Past Surgical History:  Procedure Laterality Date  . ABDOMINAL HYSTERECTOMY  1970s  . BREAST BIOPSY Left 2006  . BREAST LUMPECTOMY Left 2006  . BUNIONECTOMY WITH HAMMERTOE RECONSTRUCTION Left   . ESOPHAGOGASTRODUODENOSCOPY N/A 05/29/2017   Procedure: ESOPHAGOGASTRODUODENOSCOPY (EGD);  Surgeon: Irene Shipper, MD;  Location[facility];  Service: Endoscopy;  Laterality: N/A;  . JOINT REPLACEMENT    . LAPAROSCOPIC CHOLECYSTECTOMY  ~ 2005  . TOTAL HIP ARTHROPLASTY Left 2010  . TOTAL KNEE ARTHROPLASTY Bilateral 2005-2006    Current Medications: Current Meds  Medication Sig  . acetaminophen (TYLENOL) 500 MG tablet Take 500 mg by mouth every 6 (six) hours as needed for mild pain.  Marland Kitchen ALPRAZolam (XANAX) 0.5 MG tablet Take 0.5 mg by mouth at bedtime as needed for anxiety.  Marland Kitchen atorvastatin (LIPITOR) 20 MG tablet Take 20 mg by mouth daily.  . BD PEN NEEDLE NANO 2ND GEN 32G X 4 MM MISC daily.  . calcium-vitamin D (OSCAL 500/200 D-3) 500-200 MG-UNIT tablet Take 1 tablet 2 (two) times daily by mouth.  . diltiazem (CARDIZEM) 30 MG tablet Take 30 mg by mouth daily.  . DULoxetine (CYMBALTA) 60 MG capsule Take 60 mg by mouth daily.  . febuxostat (ULORIC) 40 MG tablet Take 40 mg daily by mouth.  . furosemide (LASIX) 40 MG tablet Take 20 mg by mouth daily.  . insulin degludec (TRESIBA FLEXTOUCH) 100 UNIT/ML SOPN FlexTouch Pen   . levETIRAcetam (KEPPRA) 500 MG tablet Take 500 mg by mouth 2 (two) times daily.  Marland Kitchen levothyroxine (SYNTHROID, LEVOTHROID) 100 MCG tablet Take 100 mcg by mouth daily before breakfast.  . metoprolol (TOPROL-XL) 200 MG 24 hr tablet Take 100 mg by mouth at bedtime.   . montelukast (SINGULAIR) 10 MG tablet Take 10 mg at bedtime by mouth.  . Multiple Vitamin (MULTIVITAMIN WITH MINERALS) TABS tablet Take 1 tablet daily by mouth.  . nateglinide (STARLIX) 120 MG tablet Take  120 mg by mouth 3 (three) times daily with meals.   Marland Kitchen omeprazole (PRILOSEC) 40 MG capsule Take 40 mg by mouth 2 (two) times daily.  Marland Kitchen OZEMPIC, 0.25 OR 0.5 MG/DOSE, 2 MG/1.5ML SOPN Inject 0.5 mg into the skin once a week.  . Rivaroxaban (XARELTO) 15 MG TABS tablet Take 15 mg by mouth daily.  Marland Kitchen spironolactone (ALDACTONE) 25 MG tablet Take 25 mg by mouth 2 (two) times daily.     Allergies:   Allopurinol   Social History   Socioeconomic History  . Marital status: Married    Spouse name: Not on file  . Number of children: 1  . Years of education: Not on file  . Highest education level: Not on file  Occupational History  . Occupation: retired  Tobacco Use  . Smoking status: Former Smoker    Packs/day: 1.00    Years: 30.00  Pack years: 30.00    Types: Cigarettes    Quit date: 1995    Years since quitting: 26.6  . Smokeless tobacco: Never Used  . Tobacco comment: "quit smoking cigarettes in the 1990s"  Vaping Use  . Vaping Use: Never used  Substance and Sexual Activity  . Alcohol use: No  . Drug use: No  . Sexual activity: Not Currently  Other Topics Concern  . Not on file  Social History Narrative  . Not on file   Social Determinants of Health   Financial Resource Strain:   . Difficulty of Paying Living Expenses:   Food Insecurity:   . Worried About Charity fundraiser in the Last Year:   . Arboriculturist in the Last Year:   Transportation Needs:   . Film/video editor (Medical):   Marland Kitchen Lack of Transportation (Non-Medical):   Physical Activity:   . Days of Exercise per Week:   . Minutes of Exercise per Session:   Stress:   . Feeling of Stress :   Social Connections:   . Frequency of Communication with Friends and Family:   . Frequency of Social Gatherings with Friends and Family:   . Attends Religious Services:   . Active Member of Clubs or Organizations:   . Attends Archivist Meetings:   Marland Kitchen Marital Status:      Family History: The patient's  family history includes Breast cancer in her cousin and sister; Colon cancer in her maternal grandmother; Diabetes in her sister and another family member; Hypertension in her mother; Kidney disease in her sister; Liver cancer in her mother; Uterine cancer in her mother. ROS:   Please see the history of present illness.    All 14 point review of systems negative except as described per history of present illness  EKGs/Labs/Other Studies Reviewed:      Recent Labs: 09/05/2019: BUN 28; Creatinine, Ser 1.38; Potassium 4.6; Sodium 140  Recent Lipid Panel    Component Value Date/Time   CHOL 131 07/11/2017 0347   TRIG 144 07/11/2017 0347   HDL 46 07/11/2017 0347   CHOLHDL 2.8 07/11/2017 0347   VLDL 29 07/11/2017 0347   LDLCALC 56 07/11/2017 0347    Physical Exam:    VS:  BP 140/88 (BP Location: Right Arm, Patient Position: Sitting, Cuff Size: Normal)   Pulse 98   Ht 5' 1" (1.549 m)   Wt 157 lb (71.2 kg)   SpO2 (!) 85%   BMI 29.66 kg/m     Wt Readings from Last 3 Encounters:  02/16/20 157 lb (71.2 kg)  12/31/19 156 lb (70.8 kg)  12/19/19 157 lb 9.6 oz (71.5 kg)     GEN:  Well nourished, well developed in no acute distress HEENT: Normal NECK: No JVD; No carotid bruits LYMPHATICS: No lymphadenopathy CARDIAC: RRR, P2 is loud, holosystolic murmur grade 2/6 best heard at left border of the sternum, no rubs, no gallops RESPIRATORY:  Clear to auscultation without rales, wheezing or rhonchi  ABDOMEN: Soft, non-tender, non-distended MUSCULOSKELETAL:  No edema; No deformity  SKIN: Warm and dry LOWER EXTREMITIES: no swelling NEUROLOGIC:  Alert and oriented x 3 PSYCHIATRIC:  Normal affect   ASSESSMENT:    1. Pulmonary HTN ([place])   2. Essential hypertension, benign   3. Chronic diastolic CHF (congestive heart failure) ([place])   4. OSA on CPAP   5. Controlled type 2 diabetes mellitus with stage 2 chronic kidney disease, without long-term current use of insulin (  [place])    PLAN:    In  order of problems listed above:  1. Pulmonary hypertension which is severe.  Measured by echocardiogram 100 mmHg, there is classically shifting of the left ventricle.  Etiology of this phenomenon is unclear.  She does have restrictive pattern on the transmitral flow suggesting restrictive cardiomyopathy which can contribute to etiology.  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  Which could be group III.  Story is quite complicated she will be referred to pulmonary HTN specialist for lower right and left cardiac catheterization trying to delineate exactly what is the reason for her pulmonary hypertension is.  In the meantime quickest way to help with this will be to give her oxygen.  I will try to make arrangements for it.  I will also check a proBNP as well as Chem-7 to see if I can increase diuresis. 2. Essential hypertension blood pressure slightly elevated was given opportunity to give her a little more diuretic that should help with restriction.  3. History of DVT and PE she is anticoagulant with Xarelto which I will continue. 4.  Obstructive sleep apnea: She does use CPAP mask on the regular basis. Chronic diastolic congestive heart failure with restrictive pattern transmitral flow.  We will check proBNP as well as Chem-7 to see if we can increase diuresis.  On top of that blood pressure need to be better controlled. 5.  Diabetes follow-up by internal medicine team.   Medication Adjustments/Labs and Tests Ordered: Current medicines are reviewed at length with the patient today.  Concerns regarding medicines are outlined above.  No orders of the defined types were placed in this encounter.  Medication changes: No orders of the defined types were placed in this encounter.   Signed, Park Liter, MD, [facility] 02/16/2020 2:12 PM    [place]

## 2020-02-16 NOTE — Addendum Note (Signed)
Addended by: Ashok Norris on: 02/16/2020 02:45 PM   Modules accepted: Orders

## 2020-02-17 ENCOUNTER — Ambulatory Visit: Payer: Medicare PPO | Attending: Family Medicine | Admitting: Physical Therapy

## 2020-02-17 ENCOUNTER — Encounter: Payer: Self-pay | Admitting: Physical Therapy

## 2020-02-17 DIAGNOSIS — Z9181 History of falling: Secondary | ICD-10-CM | POA: Diagnosis present

## 2020-02-17 DIAGNOSIS — R262 Difficulty in walking, not elsewhere classified: Secondary | ICD-10-CM

## 2020-02-17 DIAGNOSIS — M62830 Muscle spasm of back: Secondary | ICD-10-CM | POA: Diagnosis present

## 2020-02-17 DIAGNOSIS — R5381 Other malaise: Secondary | ICD-10-CM | POA: Diagnosis present

## 2020-02-17 DIAGNOSIS — M545 Low back pain, unspecified: Secondary | ICD-10-CM

## 2020-02-17 DIAGNOSIS — M6281 Muscle weakness (generalized): Secondary | ICD-10-CM | POA: Diagnosis present

## 2020-02-17 DIAGNOSIS — R2689 Other abnormalities of gait and mobility: Secondary | ICD-10-CM | POA: Diagnosis present

## 2020-02-17 LAB — CBC
Hematocrit: 47.6 % — ABNORMAL HIGH (ref 34.0–46.6)
Hemoglobin: 16.1 g/dL — ABNORMAL HIGH (ref 11.1–15.9)
MCH: 32.1 pg (ref 26.6–33.0)
MCHC: 33.8 g/dL (ref 31.5–35.7)
MCV: 95 fL (ref 79–97)
Platelets: 245 10*3/uL (ref 150–450)
RBC: 5.02 x10E6/uL (ref 3.77–5.28)
RDW: 12.3 % (ref 11.7–15.4)
WBC: 9.9 10*3/uL (ref 3.4–10.8)

## 2020-02-17 LAB — COMPREHENSIVE METABOLIC PANEL
ALT: 8 IU/L (ref 0–32)
AST: 16 IU/L (ref 0–40)
Albumin/Globulin Ratio: 1.9 (ref 1.2–2.2)
Albumin: 4.4 g/dL (ref 3.7–4.7)
Alkaline Phosphatase: 104 IU/L (ref 48–121)
BUN/Creatinine Ratio: 23 (ref 12–28)
BUN: 27 mg/dL (ref 8–27)
Bilirubin Total: 0.4 mg/dL (ref 0.0–1.2)
CO2: 25 mmol/L (ref 20–29)
Calcium: 10 mg/dL (ref 8.7–10.3)
Chloride: 103 mmol/L (ref 96–106)
Creatinine, Ser: 1.18 mg/dL — ABNORMAL HIGH (ref 0.57–1.00)
GFR calc Af Amer: 51 mL/min/{1.73_m2} — ABNORMAL LOW (ref >59)
GFR calc non Af Amer: 44 mL/min/{1.73_m2} — ABNORMAL LOW (ref >59)
Globulin, Total: 2.3 g/dL (ref 1.5–4.5)
Glucose: 136 mg/dL — ABNORMAL HIGH (ref 65–99)
Potassium: 5.3 mmol/L — ABNORMAL HIGH (ref 3.5–5.2)
Sodium: 142 mmol/L (ref 134–144)
Total Protein: 6.7 g/dL (ref 6.0–8.5)

## 2020-02-17 LAB — TSH: TSH: 0.19 u[IU]/mL — ABNORMAL LOW (ref 0.450–4.500)

## 2020-02-17 NOTE — Therapy (Signed)
[address] [place], [name], [address] Phone: [phone]   Fax:  [phone] Progress Note Reporting Period 01/12/20 to 02/17/20 for the visits 11-20  See note below for Objective Data and Assessment of Progress/Goals.      Physical Therapy Treatment  Patient Details  Name: Amanda Short MRN: [id number] Date of Birth: 1940-08-12 Referring Provider (PT): Rennard   Encounter Date: 02/17/2020   PT End of Session - 02/17/20 0973    Visit Number 20    Date for PT Re-Evaluation 04/10/20    Authorization Type [employer]    PT Start Time 1305    PT Stop Time 1354    PT Time Calculation (min) 49 min    Activity Tolerance Patient tolerated treatment well    Behavior During Therapy [facility] for tasks assessed/performed           Past Medical History:  Diagnosis Date  . Abdominal pain   . Abnormal CT of the abdomen   . Acute blood loss anemia   . Acute cystitis with hematuria 12/19/2019  . Acute encephalopathy 06/06/2017  . Acute pulmonary embolism ([place]) 04/01/2013  . Acute respiratory failure with hypoxia ([place])   . Anxiety state   . Arthritis    "back, hands" (07/22/2015)  . Aspiration pneumonia ([place])   . Benign essential HTN   . Cancer of left breast ([place]) 2006   S/P lumpectomy  . Cellulitis of foot, right 07/21/2015  . Cellulitis of right foot 07/21/2015  . Central retinal vein occlusion of right eye 04/20/2016  . Chronic diastolic CHF (congestive heart failure) ([place]) 07/10/2017  . Colitis 05/27/2017  . Complication of anesthesia    "brief breathing problem at surgery center in ~ 2005 when I had gallbladder OR"  . Concussion 03/2013   due to fall  . Depression   . Diabetes ([place]) 04/01/2013  . 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 Diabetes mellitus type 2, controlled ([place]) 07/21/2015  . Diabetic foot infection ([place]) 07/21/2015  . Diverticulitis of colon   . Diverticulitis of large intestine without  perforation or abscess without bleeding   . Diverticulosis   . DVT (deep venous thrombosis) ([place]) 03/2013   LLE  . Encephalopathy   . Encounter for attention to tracheostomy ([place])   . Encounter for central line placement   . Encounter for orogastric (OG) tube placement   . Essential hypertension, benign 04/01/2013  . GERD (gastroesophageal reflux disease)   . History of acute respiratory failure   . History of pulmonary embolism   . HX: breast cancer 04/01/2013  . Hyperlipidemia   . Hypertension   . Hypocalcemia 05/27/2017  . Hypokalemia 07/10/2017  . Hypomagnesemia 07/11/2017  . Hypothyroidism   . Nausea vomiting and diarrhea 07/10/2017  . Non-intractable cyclical vomiting with nausea   . Nuclear sclerotic cataract of both eyes 04/20/2016  . OSA (obstructive sleep apnea) 07/10/2017  . OSA on CPAP 04/01/2013  . OSA treated with BiPAP   . Physical debility 06/23/2017  . 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 Pulmonary embolism ([place]) 03/2013  . Pulmonary HTN ([place]) 07/05/2017  . Seizure ([place]) 07/10/2017  . Seizure disorder ([place]) 07/05/2017  . Seizures ([place])   . Sepsis ([place]) 07/10/2017  . Sleep apnea   . Status epilepticus ([place])   . 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 Tachypnea   . Thyroid disease    Hypothyroid  . Tracheostomy, acute management ([place])   .  Type II diabetes mellitus ([place])     Past Surgical History:  Procedure Laterality Date  . ABDOMINAL HYSTERECTOMY  1970s  . BREAST BIOPSY Left 2006  . BREAST LUMPECTOMY Left 2006  . BUNIONECTOMY WITH HAMMERTOE RECONSTRUCTION Left   . ESOPHAGOGASTRODUODENOSCOPY N/A 05/29/2017   Procedure: ESOPHAGOGASTRODUODENOSCOPY (EGD);  Surgeon: Irene Shipper, MD;  Location: [facility];  Service: Endoscopy;  Laterality: N/A;  . JOINT REPLACEMENT    . LAPAROSCOPIC CHOLECYSTECTOMY  ~ 2005  . TOTAL HIP ARTHROPLASTY Left 2010  . TOTAL KNEE ARTHROPLASTY Bilateral 2005-2006    There were no vitals filed for this visit.   Subjective  Assessment - 02/17/20 1302    Subjective Saw cardiologist, she may get started on oxygen, may have some catheterizations, she is to continue with PT but not push    Currently in Pain? No/denies                             [facility] Adult PT Treatment/Exercise - 02/17/20 0001      High Level Balance   High Level Balance Activities Side stepping;Backward walking      Lumbar Exercises: Stretches   Passive Hamstring Stretch Right;Left;4 reps;20 seconds    Piriformis Stretch Right;Left;4 reps;20 seconds      Lumbar Exercises: Aerobic   Recumbent Bike 6 minutes 2 1 minute periods wiht it on level 0 maintaining 45 RPMs      Lumbar Exercises: Machines for Strengthening   Leg Press 20# 3x10      Lumbar Exercises: Standing   Row Both;Theraband;20 reps    Theraband Level (Row) Level 2 (Red)    Row Limitations on airex    Shoulder Extension Both;20 reps;Theraband    Theraband Level (Shoulder Extension) Level 2 (Red)    Shoulder Extension Limitations on airex    Other Standing Lumbar Exercises 2.5# hip marching, abduction and extension 10x each leg      Lumbar Exercises: Supine   Other Supine Lumbar Exercises feet on ball K2C, trunk rotation, small bridges and isometric abs                    PT Short Term Goals - 12/04/19 1627      PT SHORT TERM GOAL #1   Title independent with intial HEP    Status Achieved             PT Long Term Goals - 02/17/20 1427      PT LONG TERM GOAL #1   Status Partially Met      PT LONG TERM GOAL #2   Title decrease TUG time to less than 14 seconds for functional gait and safety    Status Partially Met      PT LONG TERM GOAL #3   Title increase BERG to 50/56 for safety and independence    Status Partially Met      PT LONG TERM GOAL #4   Title decrease LBP 50% with activities    Status Partially Met      PT LONG TERM GOAL #5   Title increase lumbar ROM 25%    Status Partially Met                 Plan -  02/17/20 1354    Clinical Impression Statement Patient saw the cardiologist yesterday, he may start some new medications, start O2 and do a catheterization.  Today when starting her O2 saturation was 92%  to start which is higher than her normal, with exertion and activity she had some reading in the 76% range and up to 86%, with rest and cues for pursed lipped breathing she could get back to 88-92%    PT Next Visit Plan will continue to see with the guidance of the cardiologist to help her function    Consulted and Agree with Plan of Care Patient           Patient will benefit from skilled therapeutic intervention in order to improve the following deficits and impairments:  Abnormal gait, Decreased range of motion, Difficulty walking, Increased muscle spasms, Decreased activity tolerance, Pain, Decreased balance, Impaired flexibility, Improper body mechanics, Postural dysfunction, Decreased strength, Decreased mobility  Visit Diagnosis: Acute bilateral low back pain without sciatica  Other abnormalities of gait and mobility  Muscle spasm of back  Muscle weakness (generalized)  Risk for falls  Difficulty in walking, not elsewhere classified  Physical debility     Problem List Patient Active Problem List   Diagnosis Date Noted  . Acute cystitis with hematuria 12/19/2019  . Hypomagnesemia 07/11/2017  . Nausea vomiting and diarrhea 07/10/2017  . Hypokalemia 07/10/2017  . Chronic diastolic CHF (congestive heart failure) ([place]) 07/10/2017  . Seizure ([place]) 07/10/2017  . OSA (obstructive sleep apnea) 07/10/2017  . Sepsis ([place]) 07/10/2017  . Seizure disorder ([place]) 07/05/2017  . Pulmonary HTN ([place]) 07/05/2017  . Anxiety state   . Physical debility 06/23/2017  . 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 History of acute respiratory failure   . Tracheostomy, acute management ([place])   . Aspiration pneumonia ([place])   . Encephalopathy   . Status epilepticus ([place])   . Benign essential  HTN   . 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 History of pulmonary embolism   . Acute blood loss anemia   . Diverticulitis of colon   . Tachypnea   . Encounter for orogastric (OG) tube placement   . Encounter for central line placement   . Encounter for attention to tracheostomy ([place])   . Acute respiratory failure with hypoxia ([place])   . Acute encephalopathy 06/06/2017  . Diverticulitis of large intestine without perforation or abscess without bleeding   . Abdominal pain   . Non-intractable cyclical vomiting with nausea   . Abnormal CT of the abdomen   . Hypocalcemia 05/27/2017  . 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 Colitis 05/27/2017  . Central retinal vein occlusion of right eye 04/20/2016  . Nuclear sclerotic cataract of both eyes 04/20/2016  . Diabetic foot infection ([place]) 07/21/2015  . Diabetes mellitus type 2, controlled ([place]) 07/21/2015  . Cellulitis of foot, right 07/21/2015  . Cellulitis of right foot 07/21/2015  . Acute pulmonary embolism ([place]) 04/01/2013  . DVT (deep venous thrombosis) ([place]) 04/01/2013  . HX: breast cancer 04/01/2013  . Concussion 04/01/2013  . Diabetes ([place]) 04/01/2013  . Essential hypertension, benign 04/01/2013  . Hypothyroidism 04/01/2013  . OSA on CPAP 04/01/2013    [name] Boast., PT 02/17/2020, 2:28 PM  [address] [place], [name], [address] Phone: [phone]   Fax:  [phone]  Name: Amanda Short MRN: [id number] Date of Birth: 02-15-1941

## 2020-02-20 ENCOUNTER — Telehealth: Payer: Self-pay | Admitting: Family Medicine

## 2020-02-20 ENCOUNTER — Telehealth: Payer: Self-pay

## 2020-02-20 DIAGNOSIS — J9601 Acute respiratory failure with hypoxia: Secondary | ICD-10-CM

## 2020-02-20 NOTE — Telephone Encounter (Signed)
Spoke with patient regarding results and recommendation.  Patient verbalizes understanding and is agreeable to plan of care. Advised patient to call back with any issues or concerns.  

## 2020-02-20 NOTE — Telephone Encounter (Signed)
-----  Message from Park Liter, MD sent at 02/19/2020  1:06 PM EDT ----- Please increase furosemide to 40 mg daily, also cut down spironolactone to only 12.5 mg daily.  Please also schedule her for VQ scan

## 2020-02-20 NOTE — Telephone Encounter (Signed)
Spoke with patient to schedule AWV phone kept disconnecting

## 2020-02-24 ENCOUNTER — Ambulatory Visit: Payer: Medicare PPO | Admitting: Physical Therapy

## 2020-02-24 ENCOUNTER — Telehealth: Payer: Self-pay | Admitting: Cardiology

## 2020-02-24 ENCOUNTER — Encounter: Payer: Self-pay | Admitting: Physical Therapy

## 2020-02-24 ENCOUNTER — Other Ambulatory Visit: Payer: Self-pay

## 2020-02-24 DIAGNOSIS — R262 Difficulty in walking, not elsewhere classified: Secondary | ICD-10-CM

## 2020-02-24 DIAGNOSIS — J9601 Acute respiratory failure with hypoxia: Secondary | ICD-10-CM

## 2020-02-24 DIAGNOSIS — Z9181 History of falling: Secondary | ICD-10-CM

## 2020-02-24 DIAGNOSIS — R5381 Other malaise: Secondary | ICD-10-CM

## 2020-02-24 DIAGNOSIS — M62830 Muscle spasm of back: Secondary | ICD-10-CM

## 2020-02-24 DIAGNOSIS — M545 Low back pain, unspecified: Secondary | ICD-10-CM

## 2020-02-24 DIAGNOSIS — R2689 Other abnormalities of gait and mobility: Secondary | ICD-10-CM

## 2020-02-24 DIAGNOSIS — M6281 Muscle weakness (generalized): Secondary | ICD-10-CM

## 2020-02-24 NOTE — Telephone Encounter (Signed)
Patient called and had a question about the test NM PULMONARY VENT AND PERF. Please call to discuss

## 2020-02-24 NOTE — Progress Notes (Signed)
Subjective:   Amanda Short is a 79 y.o. female who presents for Medicare Annual (Subsequent) preventive examination.  I connected with Liya today by telephone and verified that I am speaking with the correct person using two identifiers. Location patient: home Location provider: work Persons participating in the virtual visit: patient, Marine scientist.    I discussed the limitations, risks, security and privacy concerns of performing an evaluation and management service by telephone and the availability of in person appointments. I also discussed with the patient that there may be a patient responsible charge related to this service. The patient expressed understanding and verbally consented to this telephonic visit.    Interactive audio and video telecommunications were attempted between this provider and patient, however failed, due to patient having technical difficulties OR patient did not have access to video capability.  We continued and completed visit with audio only.  Some vital signs may be absent or patient reported.   Time Spent with patient on telephone encounter: 25 minutes  Review of Systems:   Cardiac Risk Factors include: advanced age (>26mn, >>24women);diabetes mellitus;dyslipidemia     Objective:     Vitals: Ht _0  (1.549 m)   Wt 150 lb (68 kg)   BMI 28.34 kg/m   Body mass index is 28.34 kg/m.  Advanced Directives 02/25/2020 11/18/2019 09/05/2017 07/11/2017 07/10/2017 07/08/2017 06/23/2017  Does Patient Have a Medical Advance Directive? Yes No Yes No No Yes No  Type of Advance Directive Living will;Healthcare Power of [place] - - Living will -  Does patient want to make changes to medical advance directive? - - - No - Patient declined - - No - Patient declined  Copy of [place]? Yes - validated most recent copy scanned in chart (See row information) - - - - - -  Would patient like information  on creating a medical advance directive? - No - Patient declined - - No - Patient declined - No - Patient declined  Pre-existing out of facility DNR order (yellow form or pink MOST form) - - - - - - -    Tobacco Social History   Tobacco Use  Smoking Status Former Smoker  . Packs/day: 1.00  . Years: 30.00  . Pack years: 30.00  . Types: Cigarettes  . Quit date: 183 . Years since quitting: 26.6  Smokeless Tobacco Never Used  Tobacco Comment   "quit smoking cigarettes in the 1990s"     Counseling given: Not Answered Comment: "quit smoking cigarettes in the 1990s"   Clinical Intake:  Pre-visit preparation completed: Yes  Pain : No/denies pain     Nutritional Status: BMI 25 -29 Overweight Nutritional Risks: Non-healing wound (cracked area on right great toe-she has an appt with podiatrist on 03/05/20) Diabetes: Yes CBG done?: No Did pt. bring in CBG monitor from home?: No (phone visit)  How often do you need to have someone help you when you read instructions, pamphlets, or other written materials from your doctor or pharmacy?: 1 - Never What is the last grade level you completed in school?: 2 yrs of college  Nutrition Risk Assessment:  Has the patient had any N/V/D within the last 2 months?  No  Does the patient have any non-healing wounds?  Yes  Patient states she has a cracked area on her right great toe. She has an upcoming appointment with the podiatrist . Advised to be sure to keep her podiatry  appointment. Patient voiced understanding. Has the patient had any unintentional weight loss or weight gain?  No   Diabetes:  Is the patient diabetic?  Yes  If diabetic, was a CBG obtained today?  No  Did the patient bring in their glucometer from home?  No phone visit How often do you monitor your CBG's? daily.   Financial Strains and Diabetes Management:  Are you having any financial strains with the device, your supplies or your medication? No .  Does the patient  want to be seen by Chronic Care Management for management of their diabetes?  No  Would the patient like to be referred to a Nutritionist or for Diabetic Management?  No   Diabetic Exams:  Diabetic Eye Exam: Completed 11/13/2019.   Diabetic Foot Exam:  Pt has been advised about the importance in completing this exam. To be completed by PCP at next office visit.   Interpreter Needed?: No  Information entered by :: Caroleen Hamman LPN  Past Medical History:  Diagnosis Date  . Abdominal pain   . Abnormal CT of the abdomen   . Acute blood loss anemia   . Acute cystitis with hematuria 12/19/2019  . Acute encephalopathy 06/06/2017  . Acute pulmonary embolism ([place]) 04/01/2013  . Acute respiratory failure with hypoxia ([place])   . Anxiety state   . Arthritis    "back, hands" (07/22/2015)  . Aspiration pneumonia ([place])   . Benign essential HTN   . Cancer of left breast ([place]) 2006   S/P lumpectomy  . Cellulitis of foot, right 07/21/2015  . Cellulitis of right foot 07/21/2015  . Central retinal vein occlusion of right eye 04/20/2016  . Chronic diastolic CHF (congestive heart failure) ([place]) 07/10/2017  . Colitis 05/27/2017  . Complication of anesthesia    "brief breathing problem at surgery center in ~ 2005 when I had gallbladder OR"  . Concussion 03/2013   due to fall  . Depression   . Diabetes ([place]) 04/01/2013  . 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 Diabetes mellitus type 2, controlled ([place]) 07/21/2015  . Diabetic foot infection ([place]) 07/21/2015  . Diverticulitis of colon   . Diverticulitis of large intestine without perforation or abscess without bleeding   . Diverticulosis   . DVT (deep venous thrombosis) ([place]) 03/2013   LLE  . Encephalopathy   . Encounter for attention to tracheostomy ([place])   . Encounter for central line placement   . Encounter for orogastric (OG) tube placement   . Essential hypertension, benign 04/01/2013  . GERD (gastroesophageal reflux disease)   . History of  acute respiratory failure   . History of pulmonary embolism   . HX: breast cancer 04/01/2013  . Hyperlipidemia   . Hypertension   . Hypocalcemia 05/27/2017  . Hypokalemia 07/10/2017  . Hypomagnesemia 07/11/2017  . Hypothyroidism   . Nausea vomiting and diarrhea 07/10/2017  . Non-intractable cyclical vomiting with nausea   . Nuclear sclerotic cataract of both eyes 04/20/2016  . OSA (obstructive sleep apnea) 07/10/2017  . OSA on CPAP 04/01/2013  . OSA treated with BiPAP   . Physical debility 06/23/2017  . 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 Pulmonary embolism ([place]) 03/2013  . Pulmonary HTN ([place]) 07/05/2017  . Seizure ([place]) 07/10/2017  . Seizure disorder ([place]) 07/05/2017  . Seizures ([place])   . Sepsis ([place]) 07/10/2017  . Sleep apnea   . Status epilepticus ([place])   . 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 Tachypnea   .  Thyroid disease    Hypothyroid  . Tracheostomy, acute management ([place])   . Type II diabetes mellitus ([place])    Past Surgical History:  Procedure Laterality Date  . ABDOMINAL HYSTERECTOMY  1970s  . BREAST BIOPSY Left 2006  . BREAST LUMPECTOMY Left 2006  . BUNIONECTOMY WITH HAMMERTOE RECONSTRUCTION Left   . ESOPHAGOGASTRODUODENOSCOPY N/A 05/29/2017   Procedure: ESOPHAGOGASTRODUODENOSCOPY (EGD);  Surgeon: Irene Shipper, MD;  Location: [facility];  Service: Endoscopy;  Laterality: N/A;  . JOINT REPLACEMENT    . LAPAROSCOPIC CHOLECYSTECTOMY  ~ 2005  . TOTAL HIP ARTHROPLASTY Left 2010  . TOTAL KNEE ARTHROPLASTY Bilateral 2005-2006   Family History  Problem Relation Age of Onset  . Diabetes Other   . Breast cancer Sister   . Liver cancer Mother   . Uterine cancer Mother   . Hypertension Mother   . Colon cancer Maternal Grandmother   . Breast cancer Cousin        couple  . Kidney disease Sister   . Diabetes Sister    Social History   Socioeconomic History  . Marital status: Married    Spouse name: Not on file  . Number of children: 1  . Years of education:  Not on file  . Highest education level: Not on file  Occupational History  . Occupation: retired  Tobacco Use  . Smoking status: Former Smoker    Packs/day: 1.00    Years: 30.00    Pack years: 30.00    Types: Cigarettes    Quit date: 1995    Years since quitting: 26.6  . Smokeless tobacco: Never Used  . Tobacco comment: "quit smoking cigarettes in the 1990s"  Vaping Use  . Vaping Use: Never used  Substance and Sexual Activity  . Alcohol use: No  . Drug use: No  . Sexual activity: Not Currently  Other Topics Concern  . Not on file  Social History Narrative  . Not on file   Social Determinants of Health   Financial Resource Strain: Low Risk   . Difficulty of Paying Living Expenses: Not hard at all  Food Insecurity: No Food Insecurity  . Worried About Charity fundraiser in the Last Year: Never true  . Ran Out of Food in the Last Year: Never true  Transportation Needs: No Transportation Needs  . Lack of Transportation (Medical): No  . Lack of Transportation (Non-Medical): No  Physical Activity: Insufficiently Active  . Days of Exercise per Week: 4 days  . Minutes of Exercise per Session: 30 min  Stress: No Stress Concern Present  . Feeling of Stress : Not at all  Social Connections: Moderately Isolated  . Frequency of Communication with Friends and Family: More than three times a week  . Frequency of Social Gatherings with Friends and Family: Never  . Attends Religious Services: Never  . Active Member of Clubs or Organizations: No  . Attends Archivist Meetings: Never  . Marital Status: Married    Outpatient Encounter Medications as of 02/25/2020  Medication Sig  . acetaminophen (TYLENOL) 500 MG tablet Take 500 mg by mouth every 6 (six) hours as needed for mild pain.  Marland Kitchen ALPRAZolam (XANAX) 0.5 MG tablet Take 0.5 mg by mouth at bedtime as needed for anxiety.  Marland Kitchen atorvastatin (LIPITOR) 20 MG tablet Take 20 mg by mouth daily.  . BD PEN NEEDLE NANO 2ND GEN 32G X  4 MM MISC daily.  . DULoxetine (CYMBALTA) 60 MG capsule Take 60 mg by  mouth daily.  . febuxostat (ULORIC) 40 MG tablet Take 40 mg daily by mouth.  . furosemide (LASIX) 40 MG tablet Take 40 mg by mouth daily.  . insulin degludec (TRESIBA FLEXTOUCH) 100 UNIT/ML SOPN FlexTouch Pen   . levETIRAcetam (KEPPRA) 500 MG tablet Take 500 mg by mouth 2 (two) times daily.  Marland Kitchen levothyroxine (SYNTHROID, LEVOTHROID) 100 MCG tablet Take 100 mcg by mouth daily before breakfast.  . metoprolol (TOPROL-XL) 200 MG 24 hr tablet Take 100 mg by mouth at bedtime.   . montelukast (SINGULAIR) 10 MG tablet Take 10 mg at bedtime by mouth.  . Multiple Vitamin (MULTIVITAMIN WITH MINERALS) TABS tablet Take 1 tablet daily by mouth.  . nateglinide (STARLIX) 120 MG tablet Take 120 mg by mouth 3 (three) times daily with meals.   Marland Kitchen omeprazole (PRILOSEC) 40 MG capsule Take 40 mg by mouth 2 (two) times daily.  Marland Kitchen OZEMPIC, 0.25 OR 0.5 MG/DOSE, 2 MG/1.5ML SOPN Inject 0.5 mg into the skin once a week.  . Rivaroxaban (XARELTO) 15 MG TABS tablet Take 15 mg by mouth daily.  Marland Kitchen spironolactone (ALDACTONE) 25 MG tablet Take 12.5 mg by mouth daily.  . calcium-vitamin D (OSCAL 500/200 D-3) 500-200 MG-UNIT tablet Take 1 tablet 2 (two) times daily by mouth.  . diltiazem (CARDIZEM) 30 MG tablet Take 30 mg by mouth daily. (Patient not taking: Reported on 02/25/2020)   No facility-administered encounter medications on file as of 02/25/2020.    Activities of Daily Living In your present state of health, do you have any difficulty performing the following activities: 02/25/2020  Hearing? N  Vision? N  Difficulty concentrating or making decisions? Y  Comment sometimes forgets names  Walking or climbing stairs? N  Dressing or bathing? N  Doing errands, shopping? N  Preparing Food and eating ? N  Using the Toilet? N  In the past six months, have you accidently leaked urine? N  Do you have problems with loss of bowel control? N  Managing your  Medications? N  Managing your Finances? N  Housekeeping or managing your Housekeeping? N  Some recent data might be hidden    Patient Care Team: Libby Maw, MD as PCP - General (Family Medicine)    Assessment:   This is a routine wellness examination for Fran.  Exercise Activities and Dietary recommendations Current Exercise Habits: Structured exercise class;Home exercise routine (currently doing physical therapy 2 times per week), Type of exercise: exercise ball;strength training/weights, Time (Minutes): 40, Frequency (Times/Week): 4, Weekly Exercise (Minutes/Week): 160, Intensity: Mild, Exercise limited by: respiratory conditions(s)  Goals Addressed            This Visit's Progress   . Patient Stated       Continue physical therapy to help with mobility & breathing.       Fall Risk: Fall Risk  02/25/2020 12/19/2019  Falls in the past year? 0 0  Number falls in past yr: 0 -  Injury with Fall? 0 -  Follow up Falls prevention discussed -    FALL RISK PREVENTION PERTAINING TO THE HOME:  Any stairs in or around the home? No  Home free of loose throw rugs in walkways, pet beds, electrical cords, etc? Yes  Adequate lighting in your home to reduce risk of falls? Yes   ASSISTIVE DEVICES UTILIZED TO PREVENT FALLS:  Life alert? No  Use of a cane, walker or w/c? No  Grab bars in the bathroom? Yes  Shower chair or bench in shower?  Yes  Elevated toilet seat or a handicapped toilet? No   TIMED UP AND GO:  Was the test performed? No . Phone visit   Depression Screen PHQ 2/9 Scores 02/25/2020 12/19/2019 12/19/2019  PHQ - 2 Score 0 0 0  PHQ- 9 Score - 1 -     Cognitive Function: No cognitive impairment noted. Patient plays computer games & reads for brain health.        Immunization History  Administered Date(s) Administered  . Influenza-Unspecified 04/17/2015  . PFIZER SARS-COV-2 Vaccination 08/31/2019, 09/23/2019  . Pneumococcal-Unspecified 06/21/2015  .  Tdap 07/22/2015    Qualifies for Shingles Vaccine? Yes  Zostavax completed no. Due for Shingrix. Education has been provided regarding the importance of this vaccine. Pt has been advised to call insurance company to determine out of pocket expense. Advised may also receive vaccine at local pharmacy or Health Dept. Verbalized acceptance and understanding.  Tdap: Up to Date  Flu Vaccine: Due 03/2020  Pneumococcal Vaccine: Due for Pneumococcal vaccine. Does the patient want to receive this vaccine today?  No .phone visit Education has been provided regarding the importance of this vaccine . Discuss with PCP  Covid-19 Vaccine:Completed vaccines  Screening Tests Health Maintenance  Topic Date Due  . Hepatitis C Screening  Never done  . FOOT EXAM  Never done  . URINE MICROALBUMIN  Never done  . DEXA SCAN  Never done  . PNA vac Low Risk Adult (2 of 2 - PCV13) 06/20/2016  . HEMOGLOBIN A1C  01/09/2018  . INFLUENZA VACCINE  02/15/2020  . OPHTHALMOLOGY EXAM  11/12/2020  . TETANUS/TDAP  07/21/2025  . COVID-19 Vaccine  Completed    Cancer Screenings:  Colorectal Screening: No longer required.  Mammogram:  Ordered today. Pt aware the office will call re: appt.  Bone Density: . Ordered today. Pt aware the office will call re: appt.  Lung Cancer Screening: (Low Dose CT Chest recommended if Age 52-80 years, 30 pack-year currently smoking OR have quit w/in 15years.) does not qualify.    Additional Screening:  Hepatitis C Screening: does qualify; Discuss with PCP  Dental Screening: Recommended annual dental exams for proper oral hygiene   Community Resource Referral:  CRR required this visit?  No       Plan:  I have personally reviewed and addressed the Medicare Annual Wellness questionnaire and have noted the following in the patient's chart:  A. Medical and social history B. Use of alcohol, tobacco or illicit drugs  C. Current medications and supplements D. Functional ability  and status E.  Nutritional status F.  Physical activity G. Advance directives H. List of other physicians I.  Hospitalizations, surgeries, and ER visits in previous 12 months J.  [place] such as hearing and vision if needed, cognitive and depression L. Referrals and appointments   In addition, I have reviewed and discussed with patient certain preventive protocols, quality metrics, and best practice recommendations. A written personalized care plan for preventive services as well as general preventive health recommendations were provided to patient.  Due to this being a telephonic visit, the after visit summary with patients personalized plan was offered to patient via mail or my-chart.  Patient would like to access on my-chart.  Signed,    Marta Antu, LPN  [id number] Nurse Health Advisor   Nurse Notes: None

## 2020-02-24 NOTE — Therapy (Signed)
[place], [name], [address] Phone: [phone]   Fax:  [phone]  Physical Therapy Treatment  Patient Details  Name: Amanda Short MRN: [id number] Date of Birth: [birth year] Referring Provider (PT): Rennard   Encounter Date: 02/24/2020   PT End of Session - 02/24/20 1654    Visit Number 21    Date for PT Re-Evaluation 04/10/20    Authorization Type [employer]    PT Start Time 1522    PT Stop Time 1611    PT Time Calculation (min) 49 min    Activity Tolerance Patient tolerated treatment well    Behavior During Therapy [facility] for tasks assessed/performed           Past Medical History:  Diagnosis Date  . Abdominal pain   . Abnormal CT of the abdomen   . Acute blood loss anemia   . Acute cystitis with hematuria 12/19/2019  . Acute encephalopathy 06/06/2017  . Acute pulmonary embolism ([place]) 04/01/2013  . Acute respiratory failure with hypoxia ([place])   . Anxiety state   . Arthritis    "back, hands" (07/22/2015)  . Aspiration pneumonia ([place])   . Benign essential HTN   . Cancer of left breast ([place]) 2006   S/P lumpectomy  . Cellulitis of foot, right 07/21/2015  . Cellulitis of right foot 07/21/2015  . Central retinal vein occlusion of right eye 04/20/2016  . Chronic diastolic CHF (congestive heart failure) ([place]) 07/10/2017  . Colitis 05/27/2017  . Complication of anesthesia    "brief breathing problem at surgery center in ~ 2005 when I had gallbladder OR"  . Concussion 03/2013   due to fall  . Depression   . Diabetes ([place]) 04/01/2013  . 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 Diabetes mellitus type 2, controlled ([place]) 07/21/2015  . Diabetic foot infection ([place]) 07/21/2015  . Diverticulitis of colon   . Diverticulitis of large intestine without perforation or abscess without bleeding   . Diverticulosis   . DVT (deep venous thrombosis) ([place]) 03/2013   LLE  . Encephalopathy   . Encounter for  attention to tracheostomy ([place])   . Encounter for central line placement   . Encounter for orogastric (OG) tube placement   . Essential hypertension, benign 04/01/2013  . GERD (gastroesophageal reflux disease)   . History of acute respiratory failure   . History of pulmonary embolism   . HX: breast cancer 04/01/2013  . Hyperlipidemia   . Hypertension   . Hypocalcemia 05/27/2017  . Hypokalemia 07/10/2017  . Hypomagnesemia 07/11/2017  . Hypothyroidism   . Nausea vomiting and diarrhea 07/10/2017  . Non-intractable cyclical vomiting with nausea   . Nuclear sclerotic cataract of both eyes 04/20/2016  . OSA (obstructive sleep apnea) 07/10/2017  . OSA on CPAP 04/01/2013  . OSA treated with BiPAP   . Physical debility 06/23/2017  . 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 Pulmonary embolism ([place]) 03/2013  . Pulmonary HTN ([place]) 07/05/2017  . Seizure ([place]) 07/10/2017  . Seizure disorder ([place]) 07/05/2017  . Seizures ([place])   . Sepsis ([place]) 07/10/2017  . Sleep apnea   . Status epilepticus ([place])   . 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 Tachypnea   . Thyroid disease    Hypothyroid  . Tracheostomy, acute management ([place])   . Type II diabetes mellitus ([place])     Past Surgical History:  Procedure Laterality Date  . ABDOMINAL HYSTERECTOMY  1970s  .  BREAST BIOPSY Left 2006  . BREAST LUMPECTOMY Left 2006  . BUNIONECTOMY WITH HAMMERTOE RECONSTRUCTION Left   . ESOPHAGOGASTRODUODENOSCOPY N/A 05/29/2017   Procedure: ESOPHAGOGASTRODUODENOSCOPY (EGD);  Surgeon: Irene Shipper, MD;  Location: [facility];  Service: Endoscopy;  Laterality: N/A;  . JOINT REPLACEMENT    . LAPAROSCOPIC CHOLECYSTECTOMY  ~ 2005  . TOTAL HIP ARTHROPLASTY Left 2010  . TOTAL KNEE ARTHROPLASTY Bilateral 2005-2006    There were no vitals filed for this visit.   Subjective Assessment - 02/24/20 1529    Subjective Reports that she is awaiting pulmonary referral and may have another scan but has nothging scheduled yet     Currently in Pain? No/denies              [facility] PT Assessment - 02/24/20 0001      Berg Balance Test   Sit to Stand Able to stand without using hands and stabilize independently    Standing Unsupported Able to stand safely 2 minutes    Sitting with Back Unsupported but Feet Supported on Floor or Stool Able to sit safely and securely 2 minutes    Stand to Sit Sits safely with minimal use of hands    Transfers Able to transfer safely, definite need of hands    Standing Unsupported with Eyes Closed Able to stand 10 seconds safely    Standing Unsupported with Feet Together Able to place feet together independently and stand 1 minute safely    From Standing, Reach Forward with Outstretched Arm Can reach forward >12 cm safely (5")    From Standing Position, Pick up Object from Floor Able to pick up shoe, needs supervision    From Standing Position, Turn to Look Behind Over each Shoulder Turn sideways only but maintains balance    Turn 360 Degrees Able to turn 360 degrees safely but slowly    Standing Unsupported, Alternately Place Feet on Step/Stool Able to stand independently and complete 8 steps >20 seconds    Standing Unsupported, One Foot in Front Needs help to step but can hold 15 seconds    Standing on One Leg Tries to lift leg/unable to hold 3 seconds but remains standing independently    Total Score 42                         [facility] Adult PT Treatment/Exercise - 02/24/20 0001      Lumbar Exercises: Aerobic   UBE (Upper Arm Bike) level 5 x 5 minutes    Nustep level 5 x 6 minutes      Lumbar Exercises: Standing   Row Both;Theraband;20 reps    Theraband Level (Row) Level 2 (Red)    Row Limitations on airex    Shoulder Extension Both;20 reps;Theraband    Theraband Level (Shoulder Extension) Level 2 (Red)    Shoulder Extension Limitations on airex    Other Standing Lumbar Exercises 2.5# hip marching, abduction and extension 10x each leg all while standing on airex                     PT Short Term Goals - 12/04/19 1627      PT SHORT TERM GOAL #1   Title independent with intial HEP    Status Achieved             PT Long Term Goals - 02/24/20 1657      PT LONG TERM GOAL #1   Title increase overall hip LE  strength for functional gait and safety hips and ankles to 4+/5    Status Partially Met      PT LONG TERM GOAL #2   Title decrease TUG time to less than 14 seconds for functional gait and safety    Status Partially Met      PT LONG TERM GOAL #3   Title increase BERG to 50/56 for safety and independence    Status Achieved                 Plan - 02/24/20 1656    Clinical Impression Statement Patient much better with her O2 saturation ranged from 84-88% even after a long walk.  Her Berg balance test score improved 6 points    PT Next Visit Plan will continue to see with the guidance of the cardiologist to help her function    Consulted and Agree with Plan of Care Patient           Patient will benefit from skilled therapeutic intervention in order to improve the following deficits and impairments:  Abnormal gait, Decreased range of motion, Difficulty walking, Increased muscle spasms, Decreased activity tolerance, Pain, Decreased balance, Impaired flexibility, Improper body mechanics, Postural dysfunction, Decreased strength, Decreased mobility  Visit Diagnosis: Acute bilateral low back pain without sciatica  Other abnormalities of gait and mobility  Muscle spasm of back  Muscle weakness (generalized)  Risk for falls  Difficulty in walking, not elsewhere classified  Physical debility     Problem List Patient Active Problem List   Diagnosis Date Noted  . Acute cystitis with hematuria 12/19/2019  . Hypomagnesemia 07/11/2017  . Nausea vomiting and diarrhea 07/10/2017  . Hypokalemia 07/10/2017  . Chronic diastolic CHF (congestive heart failure) ([place]) 07/10/2017  . Seizure ([place]) 07/10/2017  . OSA  (obstructive sleep apnea) 07/10/2017  . Sepsis ([place]) 07/10/2017  . Seizure disorder ([place]) 07/05/2017  . Pulmonary HTN ([place]) 07/05/2017  . Anxiety state   . Physical debility 06/23/2017  . 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 History of acute respiratory failure   . Tracheostomy, acute management ([place])   . Aspiration pneumonia ([place])   . Encephalopathy   . Status epilepticus ([place])   . Benign essential HTN   . 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 History of pulmonary embolism   . Acute blood loss anemia   . Diverticulitis of colon   . Tachypnea   . Encounter for orogastric (OG) tube placement   . Encounter for central line placement   . Encounter for attention to tracheostomy ([place])   . Acute respiratory failure with hypoxia ([place])   . Acute encephalopathy 06/06/2017  . Diverticulitis of large intestine without perforation or abscess without bleeding   . Abdominal pain   . Non-intractable cyclical vomiting with nausea   . Abnormal CT of the abdomen   . Hypocalcemia 05/27/2017  . 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 Colitis 05/27/2017  . Central retinal vein occlusion of right eye 04/20/2016  . Nuclear sclerotic cataract of both eyes 04/20/2016  . Diabetic foot infection ([place]) 07/21/2015  . Diabetes mellitus type 2, controlled ([place]) 07/21/2015  . Cellulitis of foot, right 07/21/2015  . Cellulitis of right foot 07/21/2015  . Acute pulmonary embolism ([place]) 04/01/2013  . DVT (deep venous thrombosis) ([place]) 04/01/2013  . HX: breast cancer 04/01/2013  . Concussion 04/01/2013  . Diabetes ([place]) 04/01/2013  . Essential hypertension, benign 04/01/2013  . Hypothyroidism 04/01/2013  . OSA on CPAP 04/01/2013  [name] Boast., PT 02/24/2020, 4:58 PM  [place], [name], [address] Phone: [phone]   Fax:  [phone]  Name: Amanda Short MRN: [id number] Date of Birth:  [birth year]

## 2020-02-24 NOTE — Telephone Encounter (Signed)
Called patient informed her that we are most likely waiting on precert for her vq scan, once approved someone will contact her to schedule she is aware of this. Will follow up as well to see if sent to precert. No further questions at this time.

## 2020-02-25 ENCOUNTER — Ambulatory Visit (INDEPENDENT_AMBULATORY_CARE_PROVIDER_SITE_OTHER): Payer: Medicare PPO

## 2020-02-25 ENCOUNTER — Other Ambulatory Visit: Payer: Self-pay | Admitting: Cardiology

## 2020-02-25 VITALS — Ht 61.0 in | Wt 150.0 lb

## 2020-02-25 DIAGNOSIS — Z1231 Encounter for screening mammogram for malignant neoplasm of breast: Secondary | ICD-10-CM | POA: Diagnosis not present

## 2020-02-25 DIAGNOSIS — Z0000 Encounter for general adult medical examination without abnormal findings: Secondary | ICD-10-CM | POA: Diagnosis not present

## 2020-02-25 DIAGNOSIS — Z780 Asymptomatic menopausal state: Secondary | ICD-10-CM

## 2020-02-25 DIAGNOSIS — J9601 Acute respiratory failure with hypoxia: Secondary | ICD-10-CM

## 2020-02-25 NOTE — Telephone Encounter (Signed)
Called patient informed her of appointment date and time. No further questions.

## 2020-02-25 NOTE — Telephone Encounter (Signed)
Left message for patient to return call. She has been scheduled for VQ scan. Friday 02/27/2020 arrival 1030 am.

## 2020-02-25 NOTE — Patient Instructions (Signed)
Amanda Short , Thank you for taking time to come for your Medicare Wellness Visit. I appreciate your ongoing commitment to your health goals. Please review the following plan we discussed and let me know if I can assist you in the future.   Screening recommendations/referrals: Colonoscopy: No longer indicated Mammogram: Ordered today. Someone will be calling you to schedule. Bone Density:  Ordered today. Someone will be calling you to schedule. Recommended yearly ophthalmology/optometry visit for glaucoma screening and checkup Recommended yearly dental visit for hygiene and checkup  Vaccinations: Influenza vaccine: Due 03/2020 Pneumococcal vaccine: Completed one vaccine. Please discuss need for other vaccine with PCP. Tdap vaccine: Up to Date- Due 07/21/2025 Shingles vaccine: Discuss with pharmacy   Covid-19:Completed vaccines  Advanced directives: Copy on file  Conditions/risks identified: See problem list  Next appointment: Follow up in one year for your annual wellness visit    Preventive Care 12 Years and Older, Female Preventive care refers to lifestyle choices and visits with your health care provider that can promote health and wellness. What does preventive care include?  A yearly physical exam. This is also called an annual well check.  Dental exams once or twice a year.  Routine eye exams. Ask your health care provider how often you should have your eyes checked.  Personal lifestyle choices, including:  Daily care of your teeth and gums.  Regular physical activity.  Eating a healthy diet.  Avoiding tobacco and drug use.  Limiting alcohol use.  Practicing safe sex.  Taking low-dose aspirin every day.  Taking vitamin and mineral supplements as recommended by your health care provider. What happens during an annual well check? The services and screenings done by your health care provider during your annual well check will depend on your age, overall health,  lifestyle risk factors, and family history of disease. Counseling  Your health care provider may ask you questions about your:  Alcohol use.  Tobacco use.  Drug use.  Emotional well-being.  Home and relationship well-being.  Sexual activity.  Eating habits.  History of falls.  Memory and ability to understand (cognition).  Work and work Statistician.  Reproductive health. Screening  You may have the following tests or measurements:  Height, weight, and BMI.  Blood pressure.  Lipid and cholesterol levels. These may be checked every 5 years, or more frequently if you are over 21 years old.  Skin check.  Lung cancer screening. You may have this screening every year starting at age 23 if you have a 30-pack-year history of smoking and currently smoke or have quit within the past 15 years.  Fecal occult blood test (FOBT) of the stool. You may have this test every year starting at age 17.  Flexible sigmoidoscopy or colonoscopy. You may have a sigmoidoscopy every 5 years or a colonoscopy every 10 years starting at age 79.  Hepatitis C blood test.  Hepatitis B blood test.  Sexually transmitted disease (STD) testing.  Diabetes screening. This is done by checking your blood sugar (glucose) after you have not eaten for a while (fasting). You may have this done every 1-3 years.  Bone density scan. This is done to screen for osteoporosis. You may have this done starting at age 22.  Mammogram. This may be done every 1-2 years. Talk to your health care provider about how often you should have regular mammograms. Talk with your health care provider about your test results, treatment options, and if necessary, the need for more tests. Vaccines  Your  health care provider may recommend certain vaccines, such as:  Influenza vaccine. This is recommended every year.  Tetanus, diphtheria, and acellular pertussis (Tdap, Td) vaccine. You may need a Td booster every 10 years.  Zoster  vaccine. You may need this after age 45.  Pneumococcal 13-valent conjugate (PCV13) vaccine. One dose is recommended after age 23.  Pneumococcal polysaccharide (PPSV23) vaccine. One dose is recommended after age 37. Talk to your health care provider about which screenings and vaccines you need and how often you need them. This information is not intended to replace advice given to you by your health care provider. Make sure you discuss any questions you have with your health care provider. Document Released: 07/30/2015 Document Revised: 03/22/2016 Document Reviewed: 05/04/2015 Elsevier Interactive Patient Education  2017 [place] Prevention in the Home Falls can cause injuries. They can happen to people of all ages. There are many things you can do to make your home safe and to help prevent falls. What can I do on the outside of my home?  Regularly fix the edges of walkways and driveways and fix any cracks.  Remove anything that might make you trip as you walk through a door, such as a raised step or threshold.  Trim any bushes or trees on the path to your home.  Use bright outdoor lighting.  Clear any walking paths of anything that might make someone trip, such as rocks or tools.  Regularly check to see if handrails are loose or broken. Make sure that both sides of any steps have handrails.  Any raised decks and porches should have guardrails on the edges.  Have any leaves, snow, or ice cleared regularly.  Use sand or salt on walking paths during winter.  Clean up any spills in your garage right away. This includes oil or grease spills. What can I do in the bathroom?  Use night lights.  Install grab bars by the toilet and in the tub and shower. Do not use towel bars as grab bars.  Use non-skid mats or decals in the tub or shower.  If you need to sit down in the shower, use a plastic, non-slip stool.  Keep the floor dry. Clean up any water that spills on the  floor as soon as it happens.  Remove soap buildup in the tub or shower regularly.  Attach bath mats securely with double-sided non-slip rug tape.  Do not have throw rugs and other things on the floor that can make you trip. What can I do in the bedroom?  Use night lights.  Make sure that you have a light by your bed that is easy to reach.  Do not use any sheets or blankets that are too big for your bed. They should not hang down onto the floor.  Have a firm chair that has side arms. You can use this for support while you get dressed.  Do not have throw rugs and other things on the floor that can make you trip. What can I do in the kitchen?  Clean up any spills right away.  Avoid walking on wet floors.  Keep items that you use a lot in easy-to-reach places.  If you need to reach something above you, use a strong step stool that has a grab bar.  Keep electrical cords out of the way.  Do not use floor polish or wax that makes floors slippery. If you must use wax, use non-skid floor wax.  Do not  have throw rugs and other things on the floor that can make you trip. What can I do with my stairs?  Do not leave any items on the stairs.  Make sure that there are handrails on both sides of the stairs and use them. Fix handrails that are broken or loose. Make sure that handrails are as long as the stairways.  Check any carpeting to make sure that it is firmly attached to the stairs. Fix any carpet that is loose or worn.  Avoid having throw rugs at the top or bottom of the stairs. If you do have throw rugs, attach them to the floor with carpet tape.  Make sure that you have a light switch at the top of the stairs and the bottom of the stairs. If you do not have them, ask someone to add them for you. What else can I do to help prevent falls?  Wear shoes that:  Do not have high heels.  Have rubber bottoms.  Are comfortable and fit you well.  Are closed at the toe. Do not wear  sandals.  If you use a stepladder:  Make sure that it is fully opened. Do not climb a closed stepladder.  Make sure that both sides of the stepladder are locked into place.  Ask someone to hold it for you, if possible.  Clearly mark and make sure that you can see:  Any grab bars or handrails.  First and last steps.  Where the edge of each step is.  Use tools that help you move around (mobility aids) if they are needed. These include:  Canes.  Walkers.  Scooters.  Crutches.  Turn on the lights when you go into a dark area. Replace any light bulbs as soon as they burn out.  Set up your furniture so you have a clear path. Avoid moving your furniture around.  If any of your floors are uneven, fix them.  If there are any pets around you, be aware of where they are.  Review your medicines with your doctor. Some medicines can make you feel dizzy. This can increase your chance of falling. Ask your doctor what other things that you can do to help prevent falls. This information is not intended to replace advice given to you by your health care provider. Make sure you discuss any questions you have with your health care provider. Document Released: 04/29/2009 Document Revised: 12/09/2015 Document Reviewed: 08/07/2014 Elsevier Interactive Patient Education  2017 [employer].

## 2020-02-27 ENCOUNTER — Other Ambulatory Visit: Payer: Self-pay | Admitting: Cardiology

## 2020-02-27 ENCOUNTER — Ambulatory Visit (HOSPITAL_COMMUNITY)
Admission: RE | Admit: 2020-02-27 | Discharge: 2020-02-27 | Disposition: A | Discharge status: Home / Self Care | Payer: Medicare PPO | Source: Ambulatory Visit | Attending: Cardiology | Admitting: Cardiology

## 2020-02-27 ENCOUNTER — Other Ambulatory Visit: Payer: Self-pay

## 2020-02-27 DIAGNOSIS — J9601 Acute respiratory failure with hypoxia: Secondary | ICD-10-CM | POA: Diagnosis not present

## 2020-02-27 MED ORDER — TECHNETIUM TO 99M ALBUMIN AGGREGATED
4.3000 | Freq: Once | INTRAVENOUS | Status: AC | PRN
  Administered 2020-02-27: 4.3 via INTRAVENOUS

## 2020-03-02 ENCOUNTER — Encounter: Payer: Self-pay | Admitting: Physical Therapy

## 2020-03-02 ENCOUNTER — Telehealth: Payer: Self-pay

## 2020-03-02 ENCOUNTER — Other Ambulatory Visit: Payer: Self-pay

## 2020-03-02 ENCOUNTER — Ambulatory Visit: Payer: Medicare PPO | Admitting: Physical Therapy

## 2020-03-02 DIAGNOSIS — M62830 Muscle spasm of back: Secondary | ICD-10-CM

## 2020-03-02 DIAGNOSIS — M6281 Muscle weakness (generalized): Secondary | ICD-10-CM

## 2020-03-02 DIAGNOSIS — R262 Difficulty in walking, not elsewhere classified: Secondary | ICD-10-CM

## 2020-03-02 DIAGNOSIS — M545 Low back pain, unspecified: Secondary | ICD-10-CM

## 2020-03-02 DIAGNOSIS — R5381 Other malaise: Secondary | ICD-10-CM

## 2020-03-02 DIAGNOSIS — Z9181 History of falling: Secondary | ICD-10-CM

## 2020-03-02 DIAGNOSIS — R2689 Other abnormalities of gait and mobility: Secondary | ICD-10-CM

## 2020-03-02 NOTE — Therapy (Signed)
[facility] [place] [employer] [place], [name], [address] Phone: [phone]   Fax:  [phone]  Physical Therapy Treatment  Patient Details  Name: Amanda Short MRN: [id number] Date of Birth: 05-07-1941 Referring Provider (PT): Rennard   Encounter Date: 03/02/2020   PT End of Session - 03/02/20 6979    Visit Number 22    Date for PT Re-Evaluation 04/10/20    Authorization Type [employer]    PT Start Time 4801    PT Stop Time 1523    PT Time Calculation (min) 44 min    Activity Tolerance Patient tolerated treatment well    Behavior During Therapy [facility] for tasks assessed/performed           Past Medical History:  Diagnosis Date  . Abdominal pain   . Abnormal CT of the abdomen   . Acute blood loss anemia   . Acute cystitis with hematuria 12/19/2019  . Acute encephalopathy 06/06/2017  . Acute pulmonary embolism ([place]) 04/01/2013  . Acute respiratory failure with hypoxia ([place])   . Anxiety state   . Arthritis    "back, hands" (07/22/2015)  . Aspiration pneumonia ([place])   . Benign essential HTN   . Cancer of left breast ([place]) 2006   S/P lumpectomy  . Cellulitis of foot, right 07/21/2015  . Cellulitis of right foot 07/21/2015  . Central retinal vein occlusion of right eye 04/20/2016  . Chronic diastolic CHF (congestive heart failure) ([place]) 07/10/2017  . Colitis 05/27/2017  . Complication of anesthesia    "brief breathing problem at surgery center in ~ 2005 when I had gallbladder OR"  . Concussion 03/2013   due to fall  . Depression   . Diabetes ([place]) 04/01/2013  . 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 Diabetes mellitus type 2, controlled ([place]) 07/21/2015  . Diabetic foot infection ([place]) 07/21/2015  . Diverticulitis of colon   . Diverticulitis of large intestine without perforation or abscess without bleeding   . Diverticulosis   . DVT (deep venous thrombosis) ([place]) 03/2013   LLE  . Encephalopathy   . Encounter for  attention to tracheostomy ([place])   . Encounter for central line placement   . Encounter for orogastric (OG) tube placement   . Essential hypertension, benign 04/01/2013  . GERD (gastroesophageal reflux disease)   . History of acute respiratory failure   . History of pulmonary embolism   . HX: breast cancer 04/01/2013  . Hyperlipidemia   . Hypertension   . Hypocalcemia 05/27/2017  . Hypokalemia 07/10/2017  . Hypomagnesemia 07/11/2017  . Hypothyroidism   . Nausea vomiting and diarrhea 07/10/2017  . Non-intractable cyclical vomiting with nausea   . Nuclear sclerotic cataract of both eyes 04/20/2016  . OSA (obstructive sleep apnea) 07/10/2017  . OSA on CPAP 04/01/2013  . OSA treated with BiPAP   . Physical debility 06/23/2017  . 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 Pulmonary embolism ([place]) 03/2013  . Pulmonary HTN ([place]) 07/05/2017  . Seizure ([place]) 07/10/2017  . Seizure disorder ([place]) 07/05/2017  . Seizures ([place])   . Sepsis ([place]) 07/10/2017  . Sleep apnea   . Status epilepticus ([place])   . 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 Tachypnea   . Thyroid disease    Hypothyroid  . Tracheostomy, acute management ([place])   . Type II diabetes mellitus ([place])     Past Surgical History:  Procedure Laterality Date  . ABDOMINAL HYSTERECTOMY  1970s  .  BREAST BIOPSY Left 2006  . BREAST LUMPECTOMY Left 2006  . BUNIONECTOMY WITH HAMMERTOE RECONSTRUCTION Left   . ESOPHAGOGASTRODUODENOSCOPY N/A 05/29/2017   Procedure: ESOPHAGOGASTRODUODENOSCOPY (EGD);  Surgeon: Irene Shipper, MD;  Location: [facility];  Service: Endoscopy;  Laterality: N/A;  . JOINT REPLACEMENT    . LAPAROSCOPIC CHOLECYSTECTOMY  ~ 2005  . TOTAL HIP ARTHROPLASTY Left 2010  . TOTAL KNEE ARTHROPLASTY Bilateral 2005-2006    There were no vitals filed for this visit.   Subjective Assessment - 03/02/20 1444    Subjective Patient reports that she feels like her breathing is doing better, less short of breath.  lost 7# recently "could  have been water weight    Currently in Pain? 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 and then switched hands coming back      Lumbar Exercises: Stretches   Passive Hamstring Stretch Right;Left;4 reps;20 seconds    Piriformis Stretch Right;Left;4 reps;20 seconds      Lumbar Exercises: Aerobic   UBE (Upper Arm Bike) level 5 x 5 minutes    Nustep level 5 x 6 minutes      Lumbar Exercises: Machines for Strengthening   Cybex Knee Extension 10# 3x10    Cybex Knee Flexion 25# 3x10    Leg Press 20# 3x10    Other Lumbar Machine Exercise 15# squat row                    PT Short Term Goals - 12/04/19 1627      PT SHORT TERM GOAL #1   Title independent with intial HEP    Status Achieved             PT Long Term Goals - 02/24/20 1657      PT LONG TERM GOAL #1   Title increase overall hip LE strength for functional gait and safety hips and ankles to 4+/5    Status Partially Met      PT LONG TERM GOAL #2   Title decrease TUG time to less than 14 seconds for functional gait and safety    Status Partially Met      PT LONG TERM GOAL #3   Title increase BERG to 50/56 for safety and independence    Status Achieved                 Plan - 03/02/20 1532    Clinical Impression Statement Patient did great seems like some of the higher energy activities that I had her do like farmers carry and the squat row her O2 would be mid 80%, but at times with strength exercises will wtill drop into the upper 70%.    PT Next Visit Plan will continue to see with the guidance of the cardiologist to help her function    Consulted and Agree with Plan of Care Patient           Patient will benefit from skilled therapeutic intervention in order to improve the following deficits and impairments:  Abnormal gait, Decreased range of motion, Difficulty  walking, Increased muscle spasms, Decreased activity tolerance, Pain, Decreased balance, Impaired flexibility, Improper body mechanics, Postural dysfunction, Decreased strength, Decreased mobility  Visit Diagnosis: Acute bilateral low back pain without  sciatica  Other abnormalities of gait and mobility  Muscle spasm of back  Muscle weakness (generalized)  Risk for falls  Difficulty in walking, not elsewhere classified  Physical debility     Problem List Patient Active Problem List   Diagnosis Date Noted  . Acute cystitis with hematuria 12/19/2019  . Hypomagnesemia 07/11/2017  . Nausea vomiting and diarrhea 07/10/2017  . Hypokalemia 07/10/2017  . Chronic diastolic CHF (congestive heart failure) ([place]) 07/10/2017  . Seizure ([place]) 07/10/2017  . OSA (obstructive sleep apnea) 07/10/2017  . Sepsis ([place]) 07/10/2017  . Seizure disorder ([place]) 07/05/2017  . Pulmonary HTN ([place]) 07/05/2017  . Anxiety state   . Physical debility 06/23/2017  . 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 History of acute respiratory failure   . Tracheostomy, acute management ([place])   . Aspiration pneumonia ([place])   . Encephalopathy   . Status epilepticus ([place])   . Benign essential HTN   . 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 History of pulmonary embolism   . Acute blood loss anemia   . Diverticulitis of colon   . Tachypnea   . Encounter for orogastric (OG) tube placement   . Encounter for central line placement   . Encounter for attention to tracheostomy ([place])   . Acute respiratory failure with hypoxia ([place])   . Acute encephalopathy 06/06/2017  . Diverticulitis of large intestine without perforation or abscess without bleeding   . Abdominal pain   . Non-intractable cyclical vomiting with nausea   . Abnormal CT of the abdomen   . Hypocalcemia 05/27/2017  . 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 Colitis 05/27/2017  . Central retinal vein occlusion of right eye 04/20/2016  . Nuclear  sclerotic cataract of both eyes 04/20/2016  . Diabetic foot infection ([place]) 07/21/2015  . Diabetes mellitus type 2, controlled ([place]) 07/21/2015  . Cellulitis of foot, right 07/21/2015  . Cellulitis of right foot 07/21/2015  . Acute pulmonary embolism ([place]) 04/01/2013  . DVT (deep venous thrombosis) ([place]) 04/01/2013  . HX: breast cancer 04/01/2013  . Concussion 04/01/2013  . Diabetes ([place]) 04/01/2013  . Essential hypertension, benign 04/01/2013  . Hypothyroidism 04/01/2013  . OSA on CPAP 04/01/2013    [name] Boast., PT 03/02/2020, 3:34 PM  [place] [address] , [name], [address] Phone: [phone]   Fax:  [phone]  Name: Evann Koelzer MRN: [id number] Date of Birth: Jul 18, 1940

## 2020-03-02 NOTE — Telephone Encounter (Signed)
Left message on patients voicemail to please return our call.

## 2020-03-02 NOTE — Telephone Encounter (Signed)
Spoke with patient regarding results and recommendation.  Patient verbalizes understanding and is agreeable to plan of care. Advised patient to call back with any issues or concerns.

## 2020-03-02 NOTE — Telephone Encounter (Signed)
-----  Message from Park Liter, MD sent at 03/01/2020  1:59 PM EDT ----- VQ scan done in the hospital showed no evidence of PE

## 2020-03-02 NOTE — Telephone Encounter (Signed)
Patient is returning call.

## 2020-03-04 ENCOUNTER — Other Ambulatory Visit: Payer: Self-pay

## 2020-03-04 ENCOUNTER — Ambulatory Visit: Payer: Medicare PPO | Admitting: Physical Therapy

## 2020-03-04 ENCOUNTER — Encounter: Payer: Self-pay | Admitting: Physical Therapy

## 2020-03-04 DIAGNOSIS — M545 Low back pain, unspecified: Secondary | ICD-10-CM

## 2020-03-04 DIAGNOSIS — R5381 Other malaise: Secondary | ICD-10-CM

## 2020-03-04 DIAGNOSIS — R2689 Other abnormalities of gait and mobility: Secondary | ICD-10-CM

## 2020-03-04 DIAGNOSIS — M6281 Muscle weakness (generalized): Secondary | ICD-10-CM

## 2020-03-04 DIAGNOSIS — M62830 Muscle spasm of back: Secondary | ICD-10-CM

## 2020-03-04 DIAGNOSIS — R262 Difficulty in walking, not elsewhere classified: Secondary | ICD-10-CM

## 2020-03-04 DIAGNOSIS — Z9181 History of falling: Secondary | ICD-10-CM

## 2020-03-04 NOTE — Therapy (Signed)
[place], [name], [address] Phone: [phone]   Fax:  [phone]  Physical Therapy Treatment  Patient Details  Name: Amanda Short MRN: [id number] Date of Birth: 19-Mar-1941 Referring Provider (PT): Rennard   Encounter Date: 03/04/2020   PT End of Session - 03/04/20 6712    Visit Number 23    Date for PT Re-Evaluation 04/10/20    Authorization Type [employer]    PT Start Time 1604    PT Stop Time 1642    PT Time Calculation (min) 38 min    Activity Tolerance Patient tolerated treatment well    Behavior During Therapy [facility] for tasks assessed/performed           Past Medical History:  Diagnosis Date  . Abdominal pain   . Abnormal CT of the abdomen   . Acute blood loss anemia   . Acute cystitis with hematuria 12/19/2019  . Acute encephalopathy 06/06/2017  . Acute pulmonary embolism ([place]) 04/01/2013  . Acute respiratory failure with hypoxia ([place])   . Anxiety state   . Arthritis    "back, hands" (07/22/2015)  . Aspiration pneumonia ([place])   . Benign essential HTN   . Cancer of left breast ([place]) 2006   S/P lumpectomy  . Cellulitis of foot, right 07/21/2015  . Cellulitis of right foot 07/21/2015  . Central retinal vein occlusion of right eye 04/20/2016  . Chronic diastolic CHF (congestive heart failure) ([place]) 07/10/2017  . Colitis 05/27/2017  . Complication of anesthesia    "brief breathing problem at surgery center in ~ 2005 when I had gallbladder OR"  . Concussion 03/2013   due to fall  . Depression   . Diabetes ([place]) 04/01/2013  . 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 Diabetes mellitus type 2, controlled ([place]) 07/21/2015  . Diabetic foot infection ([place]) 07/21/2015  . Diverticulitis of colon   . Diverticulitis of large intestine without perforation or abscess without bleeding   . Diverticulosis   . DVT (deep venous thrombosis) ([place]) 03/2013   LLE  . Encephalopathy   . Encounter for  attention to tracheostomy ([place])   . Encounter for central line placement   . Encounter for orogastric (OG) tube placement   . Essential hypertension, benign 04/01/2013  . GERD (gastroesophageal reflux disease)   . History of acute respiratory failure   . History of pulmonary embolism   . HX: breast cancer 04/01/2013  . Hyperlipidemia   . Hypertension   . Hypocalcemia 05/27/2017  . Hypokalemia 07/10/2017  . Hypomagnesemia 07/11/2017  . Hypothyroidism   . Nausea vomiting and diarrhea 07/10/2017  . Non-intractable cyclical vomiting with nausea   . Nuclear sclerotic cataract of both eyes 04/20/2016  . OSA (obstructive sleep apnea) 07/10/2017  . OSA on CPAP 04/01/2013  . OSA treated with BiPAP   . Physical debility 06/23/2017  . 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 Pulmonary embolism ([place]) 03/2013  . Pulmonary HTN ([place]) 07/05/2017  . Seizure ([place]) 07/10/2017  . Seizure disorder ([place]) 07/05/2017  . Seizures ([place])   . Sepsis ([place]) 07/10/2017  . Sleep apnea   . Status epilepticus ([place])   . 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 Tachypnea   . Thyroid disease    Hypothyroid  . Tracheostomy, acute management ([place])   . Type II diabetes mellitus ([place])     Past Surgical History:  Procedure Laterality Date  . ABDOMINAL HYSTERECTOMY  1970s  .  BREAST BIOPSY Left 2006  . BREAST LUMPECTOMY Left 2006  . BUNIONECTOMY WITH HAMMERTOE RECONSTRUCTION Left   . ESOPHAGOGASTRODUODENOSCOPY N/A 05/29/2017   Procedure: ESOPHAGOGASTRODUODENOSCOPY (EGD);  Surgeon: Irene Shipper, MD;  Location: [facility];  Service: Endoscopy;  Laterality: N/A;  . JOINT REPLACEMENT    . LAPAROSCOPIC CHOLECYSTECTOMY  ~ 2005  . TOTAL HIP ARTHROPLASTY Left 2010  . TOTAL KNEE ARTHROPLASTY Bilateral 2005-2006    There were no vitals filed for this visit.   Subjective Assessment - 03/04/20 1621    Subjective Patient reports that she started feeling weak and jittery last night.  She is unsure of a cause.  She is unsure of a  cause, thinks she changed a medication about a week ago.[facility] Adult PT Treatment/Exercise - 03/04/20 0001      Lumbar Exercises: Aerobic   Recumbent Bike 4 minutes      Lumbar Exercises: Standing   Other Standing Lumbar Exercises 2.5# hip marching, abduction and extension 10x each leg all while standing on airex, HS curls      Lumbar Exercises: Seated   Long Arc Quad on Chair Both;2 sets;10 reps    LAQ on Chair Weights (lbs) 2.5    Other Seated Lumbar Exercises isometric abs    Other Seated Lumbar Exercises ball squeezes                    PT Short Term Goals - 12/04/19 1627      PT SHORT TERM GOAL #1   Title independent with intial HEP    Status Achieved             PT Long Term Goals - 03/04/20 1649      PT LONG TERM GOAL #1   Title increase overall hip LE strength for functional gait and safety hips and ankles to 4+/5    Status Partially Met      PT LONG TERM GOAL #2   Title decrease TUG time to less than 14 seconds for functional gait and safety    Status Partially Met                 Plan - 03/04/20 1642    Clinical Impression Statement Patient struggled today.  She reports that last night she started feeling increased fatigue and jittery.  I monitored her O2 saturation and HR throughout the session today, the O2 was steady at 82-85%, the worrisome part was her HR it went from 51-100 bpm, when she exercises it would go down, when she rested it would go up.  I asked her and her husband to monitor the O2 and the HR at least every 30-60 minutes and record, if she does continue to have issues with fatigue and the HR is not stable  they are to call the MD.    PT Next Visit Plan they are to monitor HR and O2 saturation, if continued issues they are to call MD    Consulted and Agree with Plan of Care Patient           Patient will benefit from skilled therapeutic intervention in order to improve the  following deficits and impairments:  Abnormal gait, Decreased range of motion, Difficulty walking, Increased muscle spasms, Decreased activity tolerance, Pain, Decreased balance, Impaired flexibility, Improper body mechanics, Postural dysfunction, Decreased  strength, Decreased mobility  Visit Diagnosis: Acute bilateral low back pain without sciatica  Other abnormalities of gait and mobility  Muscle spasm of back  Muscle weakness (generalized)  Risk for falls  Difficulty in walking, not elsewhere classified  Physical debility     Problem List Patient Active Problem List   Diagnosis Date Noted  . Acute cystitis with hematuria 12/19/2019  . Hypomagnesemia 07/11/2017  . Nausea vomiting and diarrhea 07/10/2017  . Hypokalemia 07/10/2017  . Chronic diastolic CHF (congestive heart failure) ([place]) 07/10/2017  . Seizure ([place]) 07/10/2017  . OSA (obstructive sleep apnea) 07/10/2017  . Sepsis ([place]) 07/10/2017  . Seizure disorder ([place]) 07/05/2017  . Pulmonary HTN ([place]) 07/05/2017  . Anxiety state   . Physical debility 06/23/2017  . 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 History of acute respiratory failure   . Tracheostomy, acute management ([place])   . Aspiration pneumonia ([place])   . Encephalopathy   . Status epilepticus ([place])   . Benign essential HTN   . 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 History of pulmonary embolism   . Acute blood loss anemia   . Diverticulitis of colon   . Tachypnea   . Encounter for orogastric (OG) tube placement   . Encounter for central line placement   . Encounter for attention to tracheostomy ([place])   . Acute respiratory failure with hypoxia ([place])   . Acute encephalopathy 06/06/2017  . Diverticulitis of large intestine without perforation or abscess without bleeding   . Abdominal pain   . Non-intractable cyclical vomiting with nausea   . Abnormal CT of the abdomen   . Hypocalcemia 05/27/2017  . 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 Colitis  05/27/2017  . Central retinal vein occlusion of right eye 04/20/2016  . Nuclear sclerotic cataract of both eyes 04/20/2016  . Diabetic foot infection ([place]) 07/21/2015  . Diabetes mellitus type 2, controlled ([place]) 07/21/2015  . Cellulitis of foot, right 07/21/2015  . Cellulitis of right foot 07/21/2015  . Acute pulmonary embolism ([place]) 04/01/2013  . DVT (deep venous thrombosis) ([place]) 04/01/2013  . HX: breast cancer 04/01/2013  . Concussion 04/01/2013  . Diabetes ([place]) 04/01/2013  . Essential hypertension, benign 04/01/2013  . Hypothyroidism 04/01/2013  . OSA on CPAP 04/01/2013    [name] Boast., PT 03/04/2020, 4:50 PM  [place], [name], [address] Phone: [phone]   Fax:  [phone]  Name: Amanda Short MRN: [id number] Date of Birth: [birth year]

## 2020-03-05 ENCOUNTER — Encounter: Payer: Self-pay | Admitting: Podiatry

## 2020-03-05 ENCOUNTER — Ambulatory Visit: Payer: Medicare PPO | Admitting: Podiatry

## 2020-03-05 DIAGNOSIS — E119 Type 2 diabetes mellitus without complications: Secondary | ICD-10-CM

## 2020-03-05 DIAGNOSIS — M79674 Pain in right toe(s): Secondary | ICD-10-CM | POA: Diagnosis not present

## 2020-03-05 DIAGNOSIS — T148XXA Other injury of unspecified body region, initial encounter: Secondary | ICD-10-CM

## 2020-03-05 DIAGNOSIS — B351 Tinea unguium: Secondary | ICD-10-CM | POA: Diagnosis not present

## 2020-03-05 DIAGNOSIS — M79675 Pain in left toe(s): Secondary | ICD-10-CM

## 2020-03-05 DIAGNOSIS — L84 Corns and callosities: Secondary | ICD-10-CM

## 2020-03-05 MED ORDER — MUPIROCIN 2 % EX OINT: TOPICAL_OINTMENT | CUTANEOUS | 1 refills | Status: AC

## 2020-03-06 NOTE — Progress Notes (Signed)
Subjective: Amanda Short presents today preventative diabetic foot care, callus(es) right foot. Aggravating factors include weightbearing with and without shoe gear. Pain is relieved with periodic professional debridement. and painful porokeratotic lesion(s) left hallux and painful mycotic toenails b/l that limit ambulation. Aggravating factors include weightbearing with and without shoe gear. Pain for both is relieved with periodic professional debridement.   Her husband is present during today's visit. Amanda Short states about 2-3 weeks ago her right foot cracked open and bled underneath her 1st metatarsal head. She denies any preceding episode of trauma, foreign body, salon visit or attempt at trimming any skin on her feet. She states it bled. She soaked it, cleaned it and treated with betadine daily. She states area is nearly healed. She denies any fever, chills, night sweats, nausea or vomiting.  Amanda Maw, MD is patient's PCP. Last visit was 12/19/2019.  Past Medical History:  Diagnosis Date  . Abdominal pain   . Abnormal CT of the abdomen   . Acute blood loss anemia   . Acute cystitis with hematuria 12/19/2019  . Acute encephalopathy 06/06/2017  . Acute pulmonary embolism ([place]) 04/01/2013  . Acute respiratory failure with hypoxia ([place])   . Anxiety state   . Arthritis    "back, hands" (07/22/2015)  . Aspiration pneumonia ([place])   . Benign essential HTN   . Cancer of left breast ([place]) 2006   S/P lumpectomy  . Cellulitis of foot, right 07/21/2015  . Cellulitis of right foot 07/21/2015  . Central retinal vein occlusion of right eye 04/20/2016  . Chronic diastolic CHF (congestive heart failure) ([place]) 07/10/2017  . Colitis 05/27/2017  . Complication of anesthesia    "brief breathing problem at surgery center in ~ 2005 when I had gallbladder OR"  . Concussion 03/2013   due to fall  . Depression   . Diabetes ([place]) 04/01/2013  . Diabetes mellitus type 2 in nonobese (HCC)    . Diabetes mellitus type 2, controlled ([place]) 07/21/2015  . Diabetic foot infection ([place]) 07/21/2015  . Diverticulitis of colon   . Diverticulitis of large intestine without perforation or abscess without bleeding   . Diverticulosis   . DVT (deep venous thrombosis) ([place]) 03/2013   LLE  . Encephalopathy   . Encounter for attention to tracheostomy ([place])   . Encounter for central line placement   . Encounter for orogastric (OG) tube placement   . Essential hypertension, benign 04/01/2013  . GERD (gastroesophageal reflux disease)   . History of acute respiratory failure   . History of pulmonary embolism   . HX: breast cancer 04/01/2013  . Hyperlipidemia   . Hypertension   . Hypocalcemia 05/27/2017  . Hypokalemia 07/10/2017  . Hypomagnesemia 07/11/2017  . Hypothyroidism   . Nausea vomiting and diarrhea 07/10/2017  . Non-intractable cyclical vomiting with nausea   . Nuclear sclerotic cataract of both eyes 04/20/2016  . OSA (obstructive sleep apnea) 07/10/2017  . OSA on CPAP 04/01/2013  . OSA treated with BiPAP   . Physical debility 06/23/2017  . 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 Pulmonary embolism ([place]) 03/2013  . Pulmonary HTN ([place]) 07/05/2017  . Seizure ([place]) 07/10/2017  . Seizure disorder ([place]) 07/05/2017  . Seizures ([place])   . Sepsis ([place]) 07/10/2017  . Sleep apnea   . Status epilepticus ([place])   . 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 Tachypnea   . Thyroid disease    Hypothyroid  . Tracheostomy, acute management ([place])   . Type II diabetes mellitus (  [place])      Current Outpatient Medications on File Prior to Visit  Medication Sig Dispense Refill  . acetaminophen (TYLENOL) 500 MG tablet Take 500 mg by mouth every 6 (six) hours as needed for mild pain.    Marland Kitchen ALPRAZolam (XANAX) 0.5 MG tablet Take 0.5 mg by mouth at bedtime as needed for anxiety.    Marland Kitchen atorvastatin (LIPITOR) 20 MG tablet Take 20 mg by mouth daily.    . BD PEN NEEDLE NANO 2ND GEN 32G X 4 MM MISC daily.    . DULoxetine  (CYMBALTA) 60 MG capsule Take 60 mg by mouth daily.    . febuxostat (ULORIC) 40 MG tablet Take 40 mg daily by mouth.    . furosemide (LASIX) 40 MG tablet Take 40 mg by mouth daily.    . insulin degludec (TRESIBA FLEXTOUCH) 100 UNIT/ML SOPN FlexTouch Pen     . levETIRAcetam (KEPPRA) 500 MG tablet Take 500 mg by mouth 2 (two) times daily.    Marland Kitchen levothyroxine (SYNTHROID, LEVOTHROID) 100 MCG tablet Take 100 mcg by mouth daily before breakfast.    . metoprolol (TOPROL-XL) 200 MG 24 hr tablet Take 100 mg by mouth at bedtime.     . montelukast (SINGULAIR) 10 MG tablet Take 10 mg at bedtime by mouth.    . Multiple Vitamin (MULTIVITAMIN WITH MINERALS) TABS tablet Take 1 tablet daily by mouth.    . nateglinide (STARLIX) 120 MG tablet Take 120 mg by mouth 3 (three) times daily with meals.     Marland Kitchen omeprazole (PRILOSEC) 40 MG capsule Take 40 mg by mouth 2 (two) times daily.    Marland Kitchen OZEMPIC, 0.25 OR 0.5 MG/DOSE, 2 MG/1.5ML SOPN Inject 0.5 mg into the skin once a week.    . Rivaroxaban (XARELTO) 15 MG TABS tablet Take 15 mg by mouth daily.    Marland Kitchen spironolactone (ALDACTONE) 25 MG tablet Take 12.5 mg by mouth daily.    . calcium-vitamin D (OSCAL 500/200 D-3) 500-200 MG-UNIT tablet Take 1 tablet 2 (two) times daily by mouth. 20 tablet 0  . diltiazem (CARDIZEM) 30 MG tablet Take 30 mg by mouth daily. (Patient not taking: Reported on 02/25/2020)     No current facility-administered medications on file prior to visit.     Allergies  Allergen Reactions  . Allopurinol Nausea And Vomiting    Objective: Amanda Short is a pleasant 79 y.o. Caucasian female in NAD. AAO x 3.   There were no vitals filed for this visit.  Vascular Examination: Neurovascular status unchanged b/l. Capillary refill time to digits immediate b/l. Palpable DP pulses b/l. Palpable PT pulses b/l. Pedal hair sparse b/l. Skin temperature gradient within normal limits b/l.  Dermatological Examination: Pedal skin with normal turgor, texture and  tone bilaterally. No open wounds bilaterally. No interdigital macerations bilaterally. Toenails 1-5 b/l elongated, dystrophic, thickened, crumbly with subungual debris and tenderness to dorsal palpation. Hyperkeratotic lesion(s) R hallux and submet head 2 right foot.  No erythema, no edema, no drainage, no flocculence. Porokeratotic lesion(s) L hallux. No erythema, no edema, no drainage, no flocculence.   Area under 1st metatarsal head right foot shows nearly healed blister.  No surrounding erythema, no edema, no drainage, no fluctuance.   Musculoskeletal: Normal muscle strength 5/5 to all lower extremity muscle groups bilaterally. No pain crepitus or joint limitation noted with ROM b/l. Hallux valgus with bunion deformity noted b/l.  Neurological Examination: Protective sensation intact 5/5 intact bilaterally with 10g monofilament b/l. Vibratory sensation intact b/l.  Assessment: 1. Blister   2. Pain due to onychomycosis of toenails of both feet   3. Callus   4. Diabetes mellitus type 2 in nonobese [facility])    Plan: -Examined patient. -Educated Mrs. Harkelroad of importance of contacting office with any wounds. She related understanding. Informed them if I am not in the office, she can definitely see any other physician anytime. I have written an Rx for Mupirocin Ointment to apply to right foot once daily. -Continue diabetic foot care principles. Literature dispensed on today.  -Toenails 1-5 b/l were debrided in length and girth with sterile nail nippers and dremel without iatrogenic bleeding.  -Callus(es) R hallux and submet head 2 right foot pared utilizing sterile scalpel blade without complication or incident. Total number debrided =2. -Painful porokeratotic lesion(s) L hallux pared and enucleated with sterile scalpel blade without incident. -Patient to continue soft, supportive shoe gear daily. -Patient to report any pedal injuries to medical professional immediately. -Patient/POA to call  should there be question/concern in the interim.  Return in about 9 weeks (around 05/07/2020).  Marzetta Board, DPM

## 2020-03-08 ENCOUNTER — Encounter: Payer: Self-pay | Admitting: Physical Therapy

## 2020-03-08 ENCOUNTER — Other Ambulatory Visit: Payer: Self-pay

## 2020-03-08 ENCOUNTER — Telehealth: Payer: Self-pay | Admitting: Cardiology

## 2020-03-08 ENCOUNTER — Ambulatory Visit: Payer: Medicare PPO | Admitting: Physical Therapy

## 2020-03-08 DIAGNOSIS — Z9181 History of falling: Secondary | ICD-10-CM

## 2020-03-08 DIAGNOSIS — M545 Low back pain, unspecified: Secondary | ICD-10-CM

## 2020-03-08 DIAGNOSIS — R5381 Other malaise: Secondary | ICD-10-CM

## 2020-03-08 DIAGNOSIS — R2689 Other abnormalities of gait and mobility: Secondary | ICD-10-CM

## 2020-03-08 DIAGNOSIS — M62830 Muscle spasm of back: Secondary | ICD-10-CM

## 2020-03-08 DIAGNOSIS — M6281 Muscle weakness (generalized): Secondary | ICD-10-CM

## 2020-03-08 DIAGNOSIS — R262 Difficulty in walking, not elsewhere classified: Secondary | ICD-10-CM

## 2020-03-08 NOTE — Therapy (Signed)
[place], [name], [address] Phone: [phone]   Fax:  [phone]  Physical Therapy Treatment  Patient Details  Name: Amanda Short MRN: [id number] Date of Birth: 1940/11/20 Referring Provider (PT): Rennard   Encounter Date: 03/08/2020   PT End of Session - 03/08/20 1610    Visit Number 24    Date for PT Re-Evaluation 04/10/20    Authorization Type [employer]    PT Start Time 1524    PT Stop Time 1604    PT Time Calculation (min) 40 min    Activity Tolerance Patient tolerated treatment well    Behavior During Therapy [facility] for tasks assessed/performed           Past Medical History:  Diagnosis Date  . Abdominal pain   . Abnormal CT of the abdomen   . Acute blood loss anemia   . Acute cystitis with hematuria 12/19/2019  . Acute encephalopathy 06/06/2017  . Acute pulmonary embolism ([place]) 04/01/2013  . Acute respiratory failure with hypoxia ([place])   . Anxiety state   . Arthritis    "back, hands" (07/22/2015)  . Aspiration pneumonia ([place])   . Benign essential HTN   . Cancer of left breast ([place]) 2006   S/P lumpectomy  . Cellulitis of foot, right 07/21/2015  . Cellulitis of right foot 07/21/2015  . Central retinal vein occlusion of right eye 04/20/2016  . Chronic diastolic CHF (congestive heart failure) ([place]) 07/10/2017  . Colitis 05/27/2017  . Complication of anesthesia    "brief breathing problem at surgery center in ~ 2005 when I had gallbladder OR"  . Concussion 03/2013   due to fall  . Depression   . Diabetes ([place]) 04/01/2013  . 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 Diabetes mellitus type 2, controlled ([place]) 07/21/2015  . Diabetic foot infection ([place]) 07/21/2015  . Diverticulitis of colon   . Diverticulitis of large intestine without perforation or abscess without bleeding   . Diverticulosis   . DVT (deep venous thrombosis) ([place]) 03/2013   LLE  . Encephalopathy   . Encounter for  attention to tracheostomy ([place])   . Encounter for central line placement   . Encounter for orogastric (OG) tube placement   . Essential hypertension, benign 04/01/2013  . GERD (gastroesophageal reflux disease)   . History of acute respiratory failure   . History of pulmonary embolism   . HX: breast cancer 04/01/2013  . Hyperlipidemia   . Hypertension   . Hypocalcemia 05/27/2017  . Hypokalemia 07/10/2017  . Hypomagnesemia 07/11/2017  . Hypothyroidism   . Nausea vomiting and diarrhea 07/10/2017  . Non-intractable cyclical vomiting with nausea   . Nuclear sclerotic cataract of both eyes 04/20/2016  . OSA (obstructive sleep apnea) 07/10/2017  . OSA on CPAP 04/01/2013  . OSA treated with BiPAP   . Physical debility 06/23/2017  . 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 Pulmonary embolism ([place]) 03/2013  . Pulmonary HTN ([place]) 07/05/2017  . Seizure ([place]) 07/10/2017  . Seizure disorder ([place]) 07/05/2017  . Seizures ([place])   . Sepsis ([place]) 07/10/2017  . Sleep apnea   . Status epilepticus ([place])   . 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 Tachypnea   . Thyroid disease    Hypothyroid  . Tracheostomy, acute management ([place])   . Type II diabetes mellitus ([place])     Past Surgical History:  Procedure Laterality Date  . ABDOMINAL HYSTERECTOMY  1970s  .  BREAST BIOPSY Left 2006  . BREAST LUMPECTOMY Left 2006  . BUNIONECTOMY WITH HAMMERTOE RECONSTRUCTION Left   . ESOPHAGOGASTRODUODENOSCOPY N/A 05/29/2017   Procedure: ESOPHAGOGASTRODUODENOSCOPY (EGD);  Surgeon: Irene Shipper, MD;  Location: [facility];  Service: Endoscopy;  Laterality: N/A;  . JOINT REPLACEMENT    . LAPAROSCOPIC CHOLECYSTECTOMY  ~ 2005  . TOTAL HIP ARTHROPLASTY Left 2010  . TOTAL KNEE ARTHROPLASTY Bilateral 2005-2006    There were no vitals filed for this visit.   Subjective Assessment - 03/08/20 1536    Subjective I am better today, reports that she had some instances of the fatigue over teh weekend, reports that she has a call  into the MD    Currently in Pain? No/denies                             [facility] Adult PT Treatment/Exercise - 03/08/20 0001      High Level Balance   High Level Balance Activities Side stepping;Backward walking    High Level Balance Comments on airex head turns, green tband scap stabilization, eyes closed      Lumbar Exercises: Aerobic   UBE (Upper Arm Bike) level 5 x 5 minutes    Nustep level 5 x 6 minutes      Lumbar Exercises: Machines for Strengthening   Cybex Knee Extension 10# 3x10    Cybex Knee Flexion 25# 3x10                    PT Short Term Goals - 12/04/19 1627      PT SHORT TERM GOAL #1   Title independent with intial HEP    Status Achieved             PT Long Term Goals - 03/04/20 1649      PT LONG TERM GOAL #1   Title increase overall hip LE strength for functional gait and safety hips and ankles to 4+/5    Status Partially Met      PT LONG TERM GOAL #2   Title decrease TUG time to less than 14 seconds for functional gait and safety    Status Partially Met                 Plan - 03/08/20 1610    Clinical Impression Statement Patient tolerated the exercises better, her oxygem ranged from 79% - 90%.  Her heart rate did not fluctuate much from 88-100 bpm with the activiy    PT Next Visit Plan continue to work on her function    Consulted and Agree with Plan of Care Patient           Patient will benefit from skilled therapeutic intervention in order to improve the following deficits and impairments:  Abnormal gait, Decreased range of motion, Difficulty walking, Increased muscle spasms, Decreased activity tolerance, Pain, Decreased balance, Impaired flexibility, Improper body mechanics, Postural dysfunction, Decreased strength, Decreased mobility  Visit Diagnosis: Acute bilateral low back pain without sciatica  Other abnormalities of gait and mobility  Muscle spasm of back  Muscle weakness (generalized)  Risk  for falls  Difficulty in walking, not elsewhere classified  Physical debility     Problem List Patient Active Problem List   Diagnosis Date Noted  . Acute cystitis with hematuria 12/19/2019  . Hypomagnesemia 07/11/2017  . Nausea vomiting and diarrhea 07/10/2017  . Hypokalemia 07/10/2017  . Chronic diastolic CHF (congestive heart failure) ([place]) 07/10/2017  .  Seizure ([place]) 07/10/2017  . OSA (obstructive sleep apnea) 07/10/2017  . Sepsis ([place]) 07/10/2017  . Seizure disorder ([place]) 07/05/2017  . Pulmonary HTN ([place]) 07/05/2017  . Anxiety state   . Physical debility 06/23/2017  . 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 History of acute respiratory failure   . Tracheostomy, acute management ([place])   . Aspiration pneumonia ([place])   . Encephalopathy   . Status epilepticus ([place])   . Benign essential HTN   . 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 History of pulmonary embolism   . Acute blood loss anemia   . Diverticulitis of colon   . Tachypnea   . Encounter for orogastric (OG) tube placement   . Encounter for central line placement   . Encounter for attention to tracheostomy ([place])   . Acute respiratory failure with hypoxia ([place])   . Acute encephalopathy 06/06/2017  . Diverticulitis of large intestine without perforation or abscess without bleeding   . Abdominal pain   . Non-intractable cyclical vomiting with nausea   . Abnormal CT of the abdomen   . Hypocalcemia 05/27/2017  . 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 Colitis 05/27/2017  . Central retinal vein occlusion of right eye 04/20/2016  . Nuclear sclerotic cataract of both eyes 04/20/2016  . Diabetic foot infection ([place]) 07/21/2015  . Diabetes mellitus type 2, controlled ([place]) 07/21/2015  . Cellulitis of foot, right 07/21/2015  . Cellulitis of right foot 07/21/2015  . Acute pulmonary embolism ([place]) 04/01/2013  . DVT (deep venous thrombosis) ([place]) 04/01/2013  . HX: breast cancer 04/01/2013  . Concussion 04/01/2013   . Diabetes ([place]) 04/01/2013  . Essential hypertension, benign 04/01/2013  . Hypothyroidism 04/01/2013  . OSA on CPAP 04/01/2013    [name] Boast., PT 03/08/2020, 4:12 PM  [place], [name], [address] Phone: [phone]   Fax:  [phone]  Name: Amanda Short MRN: [id number] Date of Birth: 1941-02-09

## 2020-03-08 NOTE — Telephone Encounter (Signed)
Patient wanted to inform Dr. Agustin Cree prior to her appointment coming up on 9/7 that she had 3 episodes of generalized weakness, HR jumping around, jitteriness, and her muscles quivering. She states she had an episode on Monday, Tuesday, and Wednesday and has not had another episode since then.

## 2020-03-16 ENCOUNTER — Encounter: Payer: Self-pay | Admitting: Physical Therapy

## 2020-03-16 ENCOUNTER — Ambulatory Visit: Payer: Medicare PPO | Admitting: Physical Therapy

## 2020-03-16 ENCOUNTER — Other Ambulatory Visit: Payer: Self-pay

## 2020-03-16 DIAGNOSIS — R5381 Other malaise: Secondary | ICD-10-CM

## 2020-03-16 DIAGNOSIS — M62830 Muscle spasm of back: Secondary | ICD-10-CM

## 2020-03-16 DIAGNOSIS — R262 Difficulty in walking, not elsewhere classified: Secondary | ICD-10-CM

## 2020-03-16 DIAGNOSIS — M6281 Muscle weakness (generalized): Secondary | ICD-10-CM

## 2020-03-16 DIAGNOSIS — M545 Low back pain, unspecified: Secondary | ICD-10-CM

## 2020-03-16 DIAGNOSIS — Z9181 History of falling: Secondary | ICD-10-CM

## 2020-03-16 DIAGNOSIS — R2689 Other abnormalities of gait and mobility: Secondary | ICD-10-CM

## 2020-03-16 NOTE — Therapy (Signed)
[place], [name], [address] Phone: [phone]   Fax:  [phone]  Physical Therapy Treatment  Patient Details  Name: Amanda Short MRN: [id number] Date of Birth: 01-06-1941 Referring Provider (PT): Rennard   Encounter Date: 03/16/2020   PT End of Session - 03/16/20 1343    Visit Number 25    Date for PT Re-Evaluation 04/10/20    Authorization Type [employer]    PT Start Time 1300    PT Stop Time 1347    PT Time Calculation (min) 47 min    Activity Tolerance Patient tolerated treatment well    Behavior During Therapy [facility] for tasks assessed/performed           Past Medical History:  Diagnosis Date  . Abdominal pain   . Abnormal CT of the abdomen   . Acute blood loss anemia   . Acute cystitis with hematuria 12/19/2019  . Acute encephalopathy 06/06/2017  . Acute pulmonary embolism ([place]) 04/01/2013  . Acute respiratory failure with hypoxia ([place])   . Anxiety state   . Arthritis    "back, hands" (07/22/2015)  . Aspiration pneumonia ([place])   . Benign essential HTN   . Cancer of left breast ([place]) 2006   S/P lumpectomy  . Cellulitis of foot, right 07/21/2015  . Cellulitis of right foot 07/21/2015  . Central retinal vein occlusion of right eye 04/20/2016  . Chronic diastolic CHF (congestive heart failure) ([place]) 07/10/2017  . Colitis 05/27/2017  . Complication of anesthesia    "brief breathing problem at surgery center in ~ 2005 when I had gallbladder OR"  . Concussion 03/2013   due to fall  . Depression   . Diabetes ([place]) 04/01/2013  . 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 Diabetes mellitus type 2, controlled ([place]) 07/21/2015  . Diabetic foot infection ([place]) 07/21/2015  . Diverticulitis of colon   . Diverticulitis of large intestine without perforation or abscess without bleeding   . Diverticulosis   . DVT (deep venous thrombosis) ([place]) 03/2013   LLE  . Encephalopathy   . Encounter for  attention to tracheostomy ([place])   . Encounter for central line placement   . Encounter for orogastric (OG) tube placement   . Essential hypertension, benign 04/01/2013  . GERD (gastroesophageal reflux disease)   . History of acute respiratory failure   . History of pulmonary embolism   . HX: breast cancer 04/01/2013  . Hyperlipidemia   . Hypertension   . Hypocalcemia 05/27/2017  . Hypokalemia 07/10/2017  . Hypomagnesemia 07/11/2017  . Hypothyroidism   . Nausea vomiting and diarrhea 07/10/2017  . Non-intractable cyclical vomiting with nausea   . Nuclear sclerotic cataract of both eyes 04/20/2016  . OSA (obstructive sleep apnea) 07/10/2017  . OSA on CPAP 04/01/2013  . OSA treated with BiPAP   . Physical debility 06/23/2017  . 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 Pulmonary embolism ([place]) 03/2013  . Pulmonary HTN ([place]) 07/05/2017  . Seizure ([place]) 07/10/2017  . Seizure disorder ([place]) 07/05/2017  . Seizures ([place])   . Sepsis ([place]) 07/10/2017  . Sleep apnea   . Status epilepticus ([place])   . 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 Tachypnea   . Thyroid disease    Hypothyroid  . Tracheostomy, acute management ([place])   . Type II diabetes mellitus ([place])     Past Surgical History:  Procedure Laterality Date  . ABDOMINAL HYSTERECTOMY  1970s  .  BREAST BIOPSY Left 2006  . BREAST LUMPECTOMY Left 2006  . BUNIONECTOMY WITH HAMMERTOE RECONSTRUCTION Left   . ESOPHAGOGASTRODUODENOSCOPY N/A 05/29/2017   Procedure: ESOPHAGOGASTRODUODENOSCOPY (EGD);  Surgeon: Irene Shipper, MD;  Location: [facility];  Service: Endoscopy;  Laterality: N/A;  . JOINT REPLACEMENT    . LAPAROSCOPIC CHOLECYSTECTOMY  ~ 2005  . TOTAL HIP ARTHROPLASTY Left 2010  . TOTAL KNEE ARTHROPLASTY Bilateral 2005-2006    There were no vitals filed for this visit.   Subjective Assessment - 03/16/20 1317    Subjective Reports that she is having some increase in difficulty breathing over the past few days, reports hot an dhumid does  not help, O2 sats to start was 77%, pursed lipped breathing  able to get up to 83% pretty fast    Currently in Pain? Yes    Pain Score 2     Pain Location Back    Pain Descriptors / Indicators Tightness    Pain Relieving Factors slept wrong                             [facility] Adult PT Treatment/Exercise - 03/16/20 0001      High Level Balance   High Level Balance Activities Side stepping;Backward walking;Direction changes    High Level Balance Comments on airex 6" toe touches, standing ball toss, the sudden direction changes had her lose her balance the most      Lumbar Exercises: Standing   Other Standing Lumbar Exercises 2.5# hip marching, abduction and extension 10x each leg all while standing on airex, HS curls      Lumbar Exercises: Seated   Long Arc Quad on Chair Both;2 sets;10 reps    LAQ on Chair Weights (lbs) 2.5    Other Seated Lumbar Exercises isometric abs    Other Seated Lumbar Exercises ball squeezes                    PT Short Term Goals - 12/04/19 1627      PT SHORT TERM GOAL #1   Title independent with intial HEP    Status Achieved             PT Long Term Goals - 03/04/20 1649      PT LONG TERM GOAL #1   Title increase overall hip LE strength for functional gait and safety hips and ankles to 4+/5    Status Partially Met      PT LONG TERM GOAL #2   Title decrease TUG time to less than 14 seconds for functional gait and safety    Status Partially Met                 Plan - 03/16/20 1347    Clinical Impression Statement Patient continues to struggle with low O2 saturation, she can stop and practice pursed lipped breathing and will increase the O2 saturations but did not go higher than 85% today.  She had issues with fast turns today causing loss of balance    PT Next Visit Plan continue to work on her function    Consulted and Agree with Plan of Care Patient           Patient will benefit from skilled  therapeutic intervention in order to improve the following deficits and impairments:  Abnormal gait, Decreased range of motion, Difficulty walking, Increased muscle spasms, Decreased activity tolerance, Pain, Decreased balance, Impaired flexibility, Improper body mechanics, Postural  dysfunction, Decreased strength, Decreased mobility  Visit Diagnosis: Acute bilateral low back pain without sciatica  Other abnormalities of gait and mobility  Muscle spasm of back  Muscle weakness (generalized)  Risk for falls  Difficulty in walking, not elsewhere classified  Physical debility     Problem List Patient Active Problem List   Diagnosis Date Noted  . Acute cystitis with hematuria 12/19/2019  . Hypomagnesemia 07/11/2017  . Nausea vomiting and diarrhea 07/10/2017  . Hypokalemia 07/10/2017  . Chronic diastolic CHF (congestive heart failure) ([place]) 07/10/2017  . Seizure ([place]) 07/10/2017  . OSA (obstructive sleep apnea) 07/10/2017  . Sepsis ([place]) 07/10/2017  . Seizure disorder ([place]) 07/05/2017  . Pulmonary HTN ([place]) 07/05/2017  . Anxiety state   . Physical debility 06/23/2017  . 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 History of acute respiratory failure   . Tracheostomy, acute management ([place])   . Aspiration pneumonia ([place])   . Encephalopathy   . Status epilepticus ([place])   . Benign essential HTN   . 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 History of pulmonary embolism   . Acute blood loss anemia   . Diverticulitis of colon   . Tachypnea   . Encounter for orogastric (OG) tube placement   . Encounter for central line placement   . Encounter for attention to tracheostomy ([place])   . Acute respiratory failure with hypoxia ([place])   . Acute encephalopathy 06/06/2017  . Diverticulitis of large intestine without perforation or abscess without bleeding   . Abdominal pain   . Non-intractable cyclical vomiting with nausea   . Abnormal CT of the abdomen   . Hypocalcemia 05/27/2017   . 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 Colitis 05/27/2017  . Central retinal vein occlusion of right eye 04/20/2016  . Nuclear sclerotic cataract of both eyes 04/20/2016  . Diabetic foot infection ([place]) 07/21/2015  . Diabetes mellitus type 2, controlled ([place]) 07/21/2015  . Cellulitis of foot, right 07/21/2015  . Cellulitis of right foot 07/21/2015  . Acute pulmonary embolism ([place]) 04/01/2013  . DVT (deep venous thrombosis) ([place]) 04/01/2013  . HX: breast cancer 04/01/2013  . Concussion 04/01/2013  . Diabetes ([place]) 04/01/2013  . Essential hypertension, benign 04/01/2013  . Hypothyroidism 04/01/2013  . OSA on CPAP 04/01/2013    [name] Boast., PT 03/16/2020, 1:51 PM  [place], [name], [address] Phone: [phone]   Fax:  [phone]  Name: Kenyette Gundy MRN: [id number] Date of Birth: April 12, 1941

## 2020-03-18 ENCOUNTER — Other Ambulatory Visit: Payer: Self-pay

## 2020-03-18 ENCOUNTER — Encounter: Payer: Self-pay | Admitting: Physical Therapy

## 2020-03-18 ENCOUNTER — Ambulatory Visit: Payer: Medicare PPO | Attending: Family Medicine | Admitting: Physical Therapy

## 2020-03-18 DIAGNOSIS — M545 Low back pain, unspecified: Secondary | ICD-10-CM

## 2020-03-18 DIAGNOSIS — R262 Difficulty in walking, not elsewhere classified: Secondary | ICD-10-CM

## 2020-03-18 DIAGNOSIS — R2689 Other abnormalities of gait and mobility: Secondary | ICD-10-CM

## 2020-03-18 DIAGNOSIS — M62830 Muscle spasm of back: Secondary | ICD-10-CM

## 2020-03-18 DIAGNOSIS — Z9181 History of falling: Secondary | ICD-10-CM

## 2020-03-18 DIAGNOSIS — R5381 Other malaise: Secondary | ICD-10-CM | POA: Diagnosis present

## 2020-03-18 DIAGNOSIS — M6281 Muscle weakness (generalized): Secondary | ICD-10-CM | POA: Diagnosis present

## 2020-03-18 NOTE — Therapy (Signed)
[place], [name], [address] Phone: [phone]   Fax:  [phone]  Physical Therapy Treatment  Patient Details  Name: Amanda Short MRN: [id number] Date of Birth: [birth year] Referring Provider (PT): Rennard   Encounter Date: 03/18/2020   PT End of Session - 03/18/20 1431    Visit Number 26    Date for PT Re-Evaluation 04/10/20    Authorization Type [employer]    PT Start Time 1345    PT Stop Time 1431    PT Time Calculation (min) 46 min    Activity Tolerance Patient tolerated treatment well    Behavior During Therapy [facility] for tasks assessed/performed           Past Medical History:  Diagnosis Date  . Abdominal pain   . Abnormal CT of the abdomen   . Acute blood loss anemia   . Acute cystitis with hematuria 12/19/2019  . Acute encephalopathy 06/06/2017  . Acute pulmonary embolism ([place]) 04/01/2013  . Acute respiratory failure with hypoxia ([place])   . Anxiety state   . Arthritis    "back, hands" (07/22/2015)  . Aspiration pneumonia ([place])   . Benign essential HTN   . Cancer of left breast ([place]) 2006   S/P lumpectomy  . Cellulitis of foot, right 07/21/2015  . Cellulitis of right foot 07/21/2015  . Central retinal vein occlusion of right eye 04/20/2016  . Chronic diastolic CHF (congestive heart failure) ([place]) 07/10/2017  . Colitis 05/27/2017  . Complication of anesthesia    "brief breathing problem at surgery center in ~ 2005 when I had gallbladder OR"  . Concussion 03/2013   due to fall  . Depression   . Diabetes ([place]) 04/01/2013  . 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 Diabetes mellitus type 2, controlled ([place]) 07/21/2015  . Diabetic foot infection ([place]) 07/21/2015  . Diverticulitis of colon   . Diverticulitis of large intestine without perforation or abscess without bleeding   . Diverticulosis   . DVT (deep venous thrombosis) ([place]) 03/2013   LLE  . Encephalopathy   . Encounter for  attention to tracheostomy ([place])   . Encounter for central line placement   . Encounter for orogastric (OG) tube placement   . Essential hypertension, benign 04/01/2013  . GERD (gastroesophageal reflux disease)   . History of acute respiratory failure   . History of pulmonary embolism   . HX: breast cancer 04/01/2013  . Hyperlipidemia   . Hypertension   . Hypocalcemia 05/27/2017  . Hypokalemia 07/10/2017  . Hypomagnesemia 07/11/2017  . Hypothyroidism   . Nausea vomiting and diarrhea 07/10/2017  . Non-intractable cyclical vomiting with nausea   . Nuclear sclerotic cataract of both eyes 04/20/2016  . OSA (obstructive sleep apnea) 07/10/2017  . OSA on CPAP 04/01/2013  . OSA treated with BiPAP   . Physical debility 06/23/2017  . 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 Pulmonary embolism ([place]) 03/2013  . Pulmonary HTN ([place]) 07/05/2017  . Seizure ([place]) 07/10/2017  . Seizure disorder ([place]) 07/05/2017  . Seizures ([place])   . Sepsis ([place]) 07/10/2017  . Sleep apnea   . Status epilepticus ([place])   . 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 Tachypnea   . Thyroid disease    Hypothyroid  . Tracheostomy, acute management ([place])   . Type II diabetes mellitus ([place])     Past Surgical History:  Procedure Laterality Date  . ABDOMINAL HYSTERECTOMY  1970s  .  BREAST BIOPSY Left 2006  . BREAST LUMPECTOMY Left 2006  . BUNIONECTOMY WITH HAMMERTOE RECONSTRUCTION Left   . ESOPHAGOGASTRODUODENOSCOPY N/A 05/29/2017   Procedure: ESOPHAGOGASTRODUODENOSCOPY (EGD);  Surgeon: Irene Shipper, MD;  Location: [facility];  Service: Endoscopy;  Laterality: N/A;  . JOINT REPLACEMENT    . LAPAROSCOPIC CHOLECYSTECTOMY  ~ 2005  . TOTAL HIP ARTHROPLASTY Left 2010  . TOTAL KNEE ARTHROPLASTY Bilateral 2005-2006    There were no vitals filed for this visit.   Subjective Assessment - 03/18/20 1348    Subjective The weather is so much better, I am breathing better.    Currently in Pain? No/denies                              [facility] Adult PT Treatment/Exercise - 03/18/20 0001      Ambulation/Gait   Gait Comments gait 150 feet without rest      High Level Balance   High Level Balance Comments on airex 6" toe taps, ball tosses on solid surface, head turns on solid surface, balance beam       Lumbar Exercises: Aerobic   Nustep level 5 x 6 minutes      Lumbar Exercises: Machines for Strengthening   Cybex Knee Extension 10# 3x10    Cybex Knee Flexion 25# 3x10    Leg Press 20# 3x10    Other Lumbar Machine Exercise 20# seated rows and lats                    PT Short Term Goals - 12/04/19 1627      PT SHORT TERM GOAL #1   Title independent with intial HEP    Status Achieved             PT Long Term Goals - 03/18/20 1435      PT LONG TERM GOAL #1   Title increase overall hip LE strength for functional gait and safety hips and ankles to 4+/5    Status Partially Met      PT LONG TERM GOAL #2   Title decrease TUG time to less than 14 seconds for functional gait and safety    Status Partially Met      PT LONG TERM GOAL #3   Title increase BERG to 50/56 for safety and independence    Status Partially Met      PT LONG TERM GOAL #4   Title decrease LBP 50% with activities    Status Achieved                 Plan - 03/18/20 1432    Clinical Impression Statement Today her O2 saturation did not go above 78%, however with the cooler weather and the lower humidity she reports breathing better.  She did not need rest breaks as much but still needed time.  I have tried to focus some on strength, functional mobility, endurance and balance as she has issues with all of these areas.  My biggest worry has been the O2 saturation levels.  Pursed lipped breathing has helped most times but today as stared I could not get it above 78%    PT Next Visit Plan she is to see the cardiologist tomorrow    Consulted and Agree with Plan of Care Patient            Patient will benefit from skilled therapeutic intervention in order to improve the following deficits and  impairments:  Abnormal gait, Decreased range of motion, Difficulty walking, Increased muscle spasms, Decreased activity tolerance, Pain, Decreased balance, Impaired flexibility, Improper body mechanics, Postural dysfunction, Decreased strength, Decreased mobility  Visit Diagnosis: Acute bilateral low back pain without sciatica  Other abnormalities of gait and mobility  Muscle spasm of back  Muscle weakness (generalized)  Risk for falls  Difficulty in walking, not elsewhere classified  Physical debility     Problem List Patient Active Problem List   Diagnosis Date Noted  . Acute cystitis with hematuria 12/19/2019  . Hypomagnesemia 07/11/2017  . Nausea vomiting and diarrhea 07/10/2017  . Hypokalemia 07/10/2017  . Chronic diastolic CHF (congestive heart failure) ([place]) 07/10/2017  . Seizure ([place]) 07/10/2017  . OSA (obstructive sleep apnea) 07/10/2017  . Sepsis ([place]) 07/10/2017  . Seizure disorder ([place]) 07/05/2017  . Pulmonary HTN ([place]) 07/05/2017  . Anxiety state   . Physical debility 06/23/2017  . 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 History of acute respiratory failure   . Tracheostomy, acute management ([place])   . Aspiration pneumonia ([place])   . Encephalopathy   . Status epilepticus ([place])   . Benign essential HTN   . 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 History of pulmonary embolism   . Acute blood loss anemia   . Diverticulitis of colon   . Tachypnea   . Encounter for orogastric (OG) tube placement   . Encounter for central line placement   . Encounter for attention to tracheostomy ([place])   . Acute respiratory failure with hypoxia ([place])   . Acute encephalopathy 06/06/2017  . Diverticulitis of large intestine without perforation or abscess without bleeding   . Abdominal pain   . Non-intractable cyclical vomiting with nausea   . Abnormal CT of  the abdomen   . Hypocalcemia 05/27/2017  . 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 Colitis 05/27/2017  . Central retinal vein occlusion of right eye 04/20/2016  . Nuclear sclerotic cataract of both eyes 04/20/2016  . Diabetic foot infection ([place]) 07/21/2015  . Diabetes mellitus type 2, controlled ([place]) 07/21/2015  . Cellulitis of foot, right 07/21/2015  . Cellulitis of right foot 07/21/2015  . Acute pulmonary embolism ([place]) 04/01/2013  . DVT (deep venous thrombosis) ([place]) 04/01/2013  . HX: breast cancer 04/01/2013  . Concussion 04/01/2013  . Diabetes ([place]) 04/01/2013  . Essential hypertension, benign 04/01/2013  . Hypothyroidism 04/01/2013  . OSA on CPAP 04/01/2013    [name] Boast., PT 03/18/2020, 2:36 PM  [place], [name], [address] Phone: [phone]   Fax:  [phone]  Name: Amanda Short MRN: [id number] Date of Birth: May 31, 1941

## 2020-03-19 ENCOUNTER — Encounter: Payer: Self-pay | Admitting: Cardiology

## 2020-03-19 ENCOUNTER — Ambulatory Visit: Payer: Medicare PPO | Admitting: Cardiology

## 2020-03-19 VITALS — BP 120/78 | HR 62 | Ht 61.0 in | Wt 156.0 lb

## 2020-03-19 DIAGNOSIS — I272 Pulmonary hypertension, unspecified: Secondary | ICD-10-CM | POA: Diagnosis not present

## 2020-03-19 DIAGNOSIS — I10 Essential (primary) hypertension: Secondary | ICD-10-CM

## 2020-03-19 DIAGNOSIS — I5032 Chronic diastolic (congestive) heart failure: Secondary | ICD-10-CM

## 2020-03-19 DIAGNOSIS — E1122 Type 2 diabetes mellitus with diabetic chronic kidney disease: Secondary | ICD-10-CM | POA: Diagnosis not present

## 2020-03-19 DIAGNOSIS — N182 Chronic kidney disease, stage 2 (mild): Secondary | ICD-10-CM

## 2020-03-19 NOTE — Progress Notes (Signed)
Cardiology Office Note:    Date:  03/19/2020   ID:  Amanda Short, DOB 1940/08/27, MRN [id number]  PCP:  Libby Maw, MD  Cardiologist:  Jenne Campus, MD    Referring MD: Libby Maw,*   Chief Complaint  Patient presents with  . Follow-up  I am doing the same  History of Present Illness:    Amanda Short is a 79 y.o. female   with past medical history significant for history of DVT and PE years ago at least 62, she was also told to have pulmonary hypertension at the time.  Additional history includes essential hypertension, diabetes, dyslipidemia.  She is ex-smoker quit smoking 1990.  She comes today to discuss results of her echocardiogram echocardiogram showed preserved left ventricle ejection fraction, there is restrictive pattern transmitral flow however the worst is the fact that she does have pulmonary hypertension with pulmonary pressure of 100 mmHg.  She complained of having shortness of breath.  Her New York Heart Association is class III.  She can barely do anything.  She does have CPAP mask with oxygen at night but does not use any oxygen at home.  She does have physical therapy and actually physical therapist was the first who realize there is a problem seeing her oxygen saturation being low.  Her oxygen saturation today is 85%.  Denies having swelling of lower extremities, no paroxysmal nocturnal dyspnea, no chest pain tightness squeezing pressure burning chest.  She does have palpitations have a recent monitor showed some very short nonsustained supraventricular tachycardias. She comes today 2 months of follow-up she does basically the same still getting tired very easily while walking and doing things.  She is scheduled to see pulmonary within the next few days.  She is oxygen at home at night  Past Medical History:  Diagnosis Date  . Abdominal pain   . Abnormal CT of the abdomen   . Acute blood loss anemia   . Acute cystitis with hematuria  12/19/2019  . Acute encephalopathy 06/06/2017  . Acute pulmonary embolism ([place]) 04/01/2013  . Acute respiratory failure with hypoxia ([place])   . Anxiety state   . Arthritis    "back, hands" (07/22/2015)  . Aspiration pneumonia ([place])   . Benign essential HTN   . Cancer of left breast ([place]) 2006   S/P lumpectomy  . Cellulitis of foot, right 07/21/2015  . Cellulitis of right foot 07/21/2015  . Central retinal vein occlusion of right eye 04/20/2016  . Chronic diastolic CHF (congestive heart failure) ([place]) 07/10/2017  . Colitis 05/27/2017  . Complication of anesthesia    "brief breathing problem at surgery center in ~ 2005 when I had gallbladder OR"  . Concussion 03/2013   due to fall  . Depression   . Diabetes ([place]) 04/01/2013  . 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 Diabetes mellitus type 2, controlled ([place]) 07/21/2015  . Diabetic foot infection ([place]) 07/21/2015  . Diverticulitis of colon   . Diverticulitis of large intestine without perforation or abscess without bleeding   . Diverticulosis   . DVT (deep venous thrombosis) ([place]) 03/2013   LLE  . Encephalopathy   . Encounter for attention to tracheostomy ([place])   . Encounter for central line placement   . Encounter for orogastric (OG) tube placement   . Essential hypertension, benign 04/01/2013  . GERD (gastroesophageal reflux disease)   . History of acute respiratory failure   . History of pulmonary embolism   . HX:  breast cancer 04/01/2013  . Hyperlipidemia   . Hypertension   . Hypocalcemia 05/27/2017  . Hypokalemia 07/10/2017  . Hypomagnesemia 07/11/2017  . Hypothyroidism   . Nausea vomiting and diarrhea 07/10/2017  . Non-intractable cyclical vomiting with nausea   . Nuclear sclerotic cataract of both eyes 04/20/2016  . OSA (obstructive sleep apnea) 07/10/2017  . OSA on CPAP 04/01/2013  . OSA treated with BiPAP   . Physical debility 06/23/2017  . 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 Pulmonary embolism ([place]) 03/2013  . Pulmonary  HTN ([place]) 07/05/2017  . Seizure ([place]) 07/10/2017  . Seizure disorder ([place]) 07/05/2017  . Seizures ([place])   . Sepsis ([place]) 07/10/2017  . Sleep apnea   . Status epilepticus ([place])   . 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 Tachypnea   . Thyroid disease    Hypothyroid  . Tracheostomy, acute management ([place])   . Type II diabetes mellitus ([place])     Past Surgical History:  Procedure Laterality Date  . ABDOMINAL HYSTERECTOMY  1970s  . BREAST BIOPSY Left 2006  . BREAST LUMPECTOMY Left 2006  . BUNIONECTOMY WITH HAMMERTOE RECONSTRUCTION Left   . ESOPHAGOGASTRODUODENOSCOPY N/A 05/29/2017   Procedure: ESOPHAGOGASTRODUODENOSCOPY (EGD);  Surgeon: Irene Shipper, MD;  Location: [facility];  Service: Endoscopy;  Laterality: N/A;  . JOINT REPLACEMENT    . LAPAROSCOPIC CHOLECYSTECTOMY  ~ 2005  . TOTAL HIP ARTHROPLASTY Left 2010  . TOTAL KNEE ARTHROPLASTY Bilateral 2005-2006    Current Medications: Current Meds  Medication Sig  . acetaminophen (TYLENOL) 500 MG tablet Take 500 mg by mouth every 6 (six) hours as needed for mild pain.  Marland Kitchen ALPRAZolam (XANAX) 0.5 MG tablet Take 0.5 mg by mouth at bedtime as needed for anxiety.  Marland Kitchen atorvastatin (LIPITOR) 20 MG tablet Take 20 mg by mouth daily.  . BD PEN NEEDLE NANO 2ND GEN 32G X 4 MM MISC daily.  . calcium-vitamin D (OSCAL 500/200 D-3) 500-200 MG-UNIT tablet Take 1 tablet 2 (two) times daily by mouth.  . diltiazem (CARDIZEM) 30 MG tablet Take 30 mg by mouth daily.   . DULoxetine (CYMBALTA) 60 MG capsule Take 60 mg by mouth daily.  . febuxostat (ULORIC) 40 MG tablet Take 40 mg daily by mouth.  . furosemide (LASIX) 40 MG tablet Take 40 mg by mouth daily.  . insulin degludec (TRESIBA FLEXTOUCH) 100 UNIT/ML SOPN FlexTouch Pen   . levETIRAcetam (KEPPRA) 500 MG tablet Take 500 mg by mouth 2 (two) times daily.  Marland Kitchen levothyroxine (SYNTHROID, LEVOTHROID) 100 MCG tablet Take 100 mcg by mouth daily before breakfast.  . metoprolol (TOPROL-XL) 200 MG 24 hr tablet  Take 100 mg by mouth at bedtime.   . montelukast (SINGULAIR) 10 MG tablet Take 10 mg at bedtime by mouth.  . Multiple Vitamin (MULTIVITAMIN WITH MINERALS) TABS tablet Take 1 tablet daily by mouth.  . mupirocin ointment (BACTROBAN) 2 % Apply to right foot once daily.  . nateglinide (STARLIX) 120 MG tablet Take 120 mg by mouth 3 (three) times daily with meals.   Marland Kitchen omeprazole (PRILOSEC) 40 MG capsule Take 40 mg by mouth 2 (two) times daily.  Marland Kitchen OZEMPIC, 0.25 OR 0.5 MG/DOSE, 2 MG/1.5ML SOPN Inject 0.5 mg into the skin once a week.  . Rivaroxaban (XARELTO) 15 MG TABS tablet Take 15 mg by mouth daily.  Marland Kitchen spironolactone (ALDACTONE) 25 MG tablet Take 12.5 mg by mouth daily.     Allergies:   Allopurinol   Social History   Socioeconomic History  .  Marital status: Married    Spouse name: Not on file  . Number of children: 1  . Years of education: Not on file  . Highest education level: Not on file  Occupational History  . Occupation: retired  Tobacco Use  . Smoking status: Former Smoker    Packs/day: 1.00    Years: 30.00    Pack years: 30.00    Types: Cigarettes    Quit date: 1995    Years since quitting: 26.6  . Smokeless tobacco: Never Used  . Tobacco comment: "quit smoking cigarettes in the 1990s"  Vaping Use  . Vaping Use: Never used  Substance and Sexual Activity  . Alcohol use: No  . Drug use: No  . Sexual activity: Not Currently  Other Topics Concern  . Not on file  Social History Narrative  . Not on file   Social Determinants of Health   Financial Resource Strain: Low Risk   . Difficulty of Paying Living Expenses: Not hard at all  Food Insecurity: No Food Insecurity  . Worried About Charity fundraiser in the Last Year: Never true  . Ran Out of Food in the Last Year: Never true  Transportation Needs: No Transportation Needs  . Lack of Transportation (Medical): No  . Lack of Transportation (Non-Medical): No  Physical Activity: Insufficiently Active  . Days of  Exercise per Week: 4 days  . Minutes of Exercise per Session: 30 min  Stress: No Stress Concern Present  . Feeling of Stress : Not at all  Social Connections: Moderately Isolated  . Frequency of Communication with Friends and Family: More than three times a week  . Frequency of Social Gatherings with Friends and Family: Never  . Attends Religious Services: Never  . Active Member of Clubs or Organizations: No  . Attends Archivist Meetings: Never  . Marital Status: Married     Family History: The patient's family history includes Breast cancer in her cousin and sister; Colon cancer in her maternal grandmother; Diabetes in her sister and another family member; Hypertension in her mother; Kidney disease in her sister; Liver cancer in her mother; Uterine cancer in her mother. ROS:   Please see the history of present illness.    All 14 point review of systems negative except as described per history of present illness  EKGs/Labs/Other Studies Reviewed:      Recent Labs: 02/16/2020: ALT 8; BUN 27; Creatinine, Ser 1.18; Hemoglobin 16.1; Platelets 245; Potassium 5.3; Sodium 142; TSH 0.190  Recent Lipid Panel    Component Value Date/Time   CHOL 131 07/11/2017 0347   TRIG 144 07/11/2017 0347   HDL 46 07/11/2017 0347   CHOLHDL 2.8 07/11/2017 0347   VLDL 29 07/11/2017 0347   LDLCALC 56 07/11/2017 0347    Physical Exam:    VS:  BP 120/78 (BP Location: Right Arm, Patient Position: Sitting, Cuff Size: Normal)   Pulse 62   Ht _0  (1.549 m)   Wt 156 lb (70.8 kg)   SpO2 90%   BMI 29.48 kg/m     Wt Readings from Last 3 Encounters:  03/19/20 156 lb (70.8 kg)  02/25/20 150 lb (68 kg)  02/16/20 157 lb (71.2 kg)     GEN:  Well nourished, well developed in no acute distress HEENT: Normal NECK: Is enlarged while sitting 45 degree angle; No carotid bruits LYMPHATICS: No lymphadenopathy CARDIAC: Tones are distant RRR, P2 is loud systolic murmur grade 2 out of 6 best heard  left  border of the sternum, no rubs, no gallops RESPIRATORY:  Clear to auscultation without rales, wheezing or rhonchi  ABDOMEN: Soft, non-tender, non-distended MUSCULOSKELETAL:  No edema; No deformity  SKIN: Warm and dry LOWER EXTREMITIES: no swelling NEUROLOGIC:  Alert and oriented x 3 PSYCHIATRIC:  Normal affect   ASSESSMENT:    1. Pulmonary HTN ([place])   2. Essential hypertension, benign   3. Chronic diastolic CHF (congestive heart failure) ([place])   4. Benign essential HTN   5. Controlled type 2 diabetes mellitus with stage 2 chronic kidney disease, without long-term current use of insulin (HCC)    PLAN:    In order of problems listed above:  1. Pulmonary hypertension with echocardiogram indicating pulmonary pressure of 100 mmHg.  She will require left and right cardiac catheter and to determine etiology of this phenomenon so far what I was able to do is to rule out pulmonary emboli as potential source of her pulmonary hypertension her VQ scan show negative.  Still she does have some diastolic dysfunction with restrictive pattern the for group 2 is possibility.  She does have also some sleep apnea as well as COPD.  She is scheduled to see pulmonary trying to clarify that. 2. Essential hypertension blood pressure seems stable better control right now.  I would continue present management right now.  Last time I did increase dose of furosemide and we did lower the dose of Aldactone.  We will recheck her Chem-7. 3. Diabetes seems to be controlled and followed by internal medicine team.   Medication Adjustments/Labs and Tests Ordered: Current medicines are reviewed at length with the patient today.  Concerns regarding medicines are outlined above.  No orders of the defined types were placed in this encounter.  Medication changes: No orders of the defined types were placed in this encounter.   Signed, Park Liter, MD, [facility] 03/19/2020 4:55 PM    [place]

## 2020-03-19 NOTE — Patient Instructions (Signed)
Medication Instructions:  Your physician recommends that you continue on your current medications as directed. Please refer to the Current Medication list given to you today.  *If you need a refill on your cardiac medications before your next appointment, please call your pharmacy*   Lab Work: NONE If you have labs (blood work) drawn today and your tests are completely normal, you will receive your results only by: Marland Kitchen MyChart Message (if you have MyChart) OR . A paper copy in the mail If you have any lab test that is abnormal or we need to change your treatment, we will call you to review the results.   Testing/Procedures: NONE   Follow-Up: At [facility], you and your health needs are our priority.  As part of our continuing mission to provide you with exceptional heart care, we have created designated Provider Care Teams.  These Care Teams include your primary Cardiologist (physician) and Advanced Practice Providers (APPs -  Physician Assistants and Nurse Practitioners) who all work together to provide you with the care you need, when you need it.  We recommend signing up for the patient portal called "MyChart".  Sign up information is provided on this After Visit Summary.  MyChart is used to connect with patients for Virtual Visits (Telemedicine).  Patients are able to view lab/test results, encounter notes, upcoming appointments, etc.  Non-urgent messages can be sent to your provider as well.   To learn more about what you can do with MyChart, go to [email or web address].    Your next appointment:   2 month(s)  The format for your next appointment:   In Person  Provider:   Jenne Campus, MD   Other Instructions

## 2020-03-23 ENCOUNTER — Other Ambulatory Visit: Payer: Self-pay

## 2020-03-23 ENCOUNTER — Ambulatory Visit: Payer: Medicare PPO | Admitting: Physical Therapy

## 2020-03-23 ENCOUNTER — Ambulatory Visit: Payer: Medicare PPO | Admitting: Family Medicine

## 2020-03-23 ENCOUNTER — Encounter: Payer: Self-pay | Admitting: Physical Therapy

## 2020-03-23 VITALS — BP 126/70 | HR 65 | Temp 96.9°F | Ht 61.0 in | Wt 157.2 lb

## 2020-03-23 DIAGNOSIS — N3001 Acute cystitis with hematuria: Secondary | ICD-10-CM | POA: Diagnosis not present

## 2020-03-23 DIAGNOSIS — R0902 Hypoxemia: Secondary | ICD-10-CM | POA: Diagnosis not present

## 2020-03-23 DIAGNOSIS — R300 Dysuria: Secondary | ICD-10-CM

## 2020-03-23 DIAGNOSIS — M545 Low back pain, unspecified: Secondary | ICD-10-CM

## 2020-03-23 DIAGNOSIS — R2689 Other abnormalities of gait and mobility: Secondary | ICD-10-CM

## 2020-03-23 DIAGNOSIS — R5381 Other malaise: Secondary | ICD-10-CM

## 2020-03-23 DIAGNOSIS — R262 Difficulty in walking, not elsewhere classified: Secondary | ICD-10-CM

## 2020-03-23 DIAGNOSIS — M62830 Muscle spasm of back: Secondary | ICD-10-CM

## 2020-03-23 DIAGNOSIS — M6281 Muscle weakness (generalized): Secondary | ICD-10-CM

## 2020-03-23 DIAGNOSIS — Z9181 History of falling: Secondary | ICD-10-CM

## 2020-03-23 HISTORY — DX: Hypoxemia: R09.02

## 2020-03-23 LAB — URINALYSIS, ROUTINE W REFLEX MICROSCOPIC
Bilirubin Urine: NEGATIVE
Hgb urine dipstick: NEGATIVE
Ketones, ur: NEGATIVE
Leukocytes,Ua: NEGATIVE
Nitrite: NEGATIVE
Specific Gravity, Urine: 1.01 (ref 1.000–1.030)
Total Protein, Urine: NEGATIVE
Urine Glucose: NEGATIVE
Urobilinogen, UA: 0.2 (ref 0.0–1.0)
pH: 6 (ref 5.0–8.0)

## 2020-03-23 LAB — POCT URINALYSIS DIPSTICK
Bilirubin, UA: NEGATIVE
Blood, UA: NEGATIVE
Glucose, UA: NEGATIVE
Ketones, UA: NEGATIVE
Leukocytes, UA: NEGATIVE
Nitrite, UA: NEGATIVE
Protein, UA: POSITIVE — AB
Spec Grav, UA: 1.015 (ref 1.010–1.025)
Urobilinogen, UA: 0.2 E.U./dL
pH, UA: 6 (ref 5.0–8.0)

## 2020-03-23 NOTE — Therapy (Signed)
[address] [place], [name], [address] Phone: [phone]   Fax:  [phone]  Physical Therapy Treatment  Patient Details  Name: Amanda Short MRN: [id number] Date of Birth: 20-Apr-1941 Referring Provider (PT): Rennard   Encounter Date: 03/23/2020   PT End of Session - 03/23/20 1612    Visit Number 27    Date for PT Re-Evaluation 04/10/20    Authorization Type [employer]    PT Start Time 6468    PT Stop Time 1701    PT Time Calculation (min) 105 min    Activity Tolerance Patient tolerated treatment well    Behavior During Therapy [facility] for tasks assessed/performed           Past Medical History:  Diagnosis Date  . Abdominal pain   . Abnormal CT of the abdomen   . Acute blood loss anemia   . Acute cystitis with hematuria 12/19/2019  . Acute encephalopathy 06/06/2017  . Acute pulmonary embolism ([place]) 04/01/2013  . Acute respiratory failure with hypoxia ([place])   . Anxiety state   . Arthritis    "back, hands" (07/22/2015)  . Aspiration pneumonia ([place])   . Benign essential HTN   . Cancer of left breast ([place]) 2006   S/P lumpectomy  . Cellulitis of foot, right 07/21/2015  . Cellulitis of right foot 07/21/2015  . Central retinal vein occlusion of right eye 04/20/2016  . Chronic diastolic CHF (congestive heart failure) ([place]) 07/10/2017  . Colitis 05/27/2017  . Complication of anesthesia    "brief breathing problem at surgery center in ~ 2005 when I had gallbladder OR"  . Concussion 03/2013   due to fall  . Depression   . Diabetes ([place]) 04/01/2013  . 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 Diabetes mellitus type 2, controlled ([place]) 07/21/2015  . Diabetic foot infection ([place]) 07/21/2015  . Diverticulitis of colon   . Diverticulitis of large intestine without perforation or abscess without bleeding   . Diverticulosis   . DVT (deep venous thrombosis) ([place]) 03/2013   LLE  . Encephalopathy   . Encounter for  attention to tracheostomy ([place])   . Encounter for central line placement   . Encounter for orogastric (OG) tube placement   . Essential hypertension, benign 04/01/2013  . GERD (gastroesophageal reflux disease)   . History of acute respiratory failure   . History of pulmonary embolism   . HX: breast cancer 04/01/2013  . Hyperlipidemia   . Hypertension   . Hypocalcemia 05/27/2017  . Hypokalemia 07/10/2017  . Hypomagnesemia 07/11/2017  . Hypothyroidism   . Nausea vomiting and diarrhea 07/10/2017  . Non-intractable cyclical vomiting with nausea   . Nuclear sclerotic cataract of both eyes 04/20/2016  . OSA (obstructive sleep apnea) 07/10/2017  . OSA on CPAP 04/01/2013  . OSA treated with BiPAP   . Physical debility 06/23/2017  . 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 Pulmonary embolism ([place]) 03/2013  . Pulmonary HTN ([place]) 07/05/2017  . Seizure ([place]) 07/10/2017  . Seizure disorder ([place]) 07/05/2017  . Seizures ([place])   . Sepsis ([place]) 07/10/2017  . Sleep apnea   . Status epilepticus ([place])   . 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 Tachypnea   . Thyroid disease    Hypothyroid  . Tracheostomy, acute management ([place])   . Type II diabetes mellitus ([place])     Past Surgical History:  Procedure Laterality Date  . ABDOMINAL HYSTERECTOMY  1970s  .  BREAST BIOPSY Left 2006  . BREAST LUMPECTOMY Left 2006  . BUNIONECTOMY WITH HAMMERTOE RECONSTRUCTION Left   . ESOPHAGOGASTRODUODENOSCOPY N/A 05/29/2017   Procedure: ESOPHAGOGASTRODUODENOSCOPY (EGD);  Surgeon: Irene Shipper, MD;  Location: [facility];  Service: Endoscopy;  Laterality: N/A;  . JOINT REPLACEMENT    . LAPAROSCOPIC CHOLECYSTECTOMY  ~ 2005  . TOTAL HIP ARTHROPLASTY Left 2010  . TOTAL KNEE ARTHROPLASTY Bilateral 2005-2006    There were no vitals filed for this visit.   Subjective Assessment - 03/23/20 1520    Subjective Patient saw the cardiologist last week.  They may refer to a pulmonologist, may get oxygen for home and could have  plans for catheterization.    Currently in Pain? No/denies              [facility] PT Assessment - 03/23/20 0001      Berg Balance Test   Sit to Stand Able to stand without using hands and stabilize independently    Standing Unsupported Able to stand safely 2 minutes    Sitting with Back Unsupported but Feet Supported on Floor or Stool Able to sit safely and securely 2 minutes    Stand to Sit Sits safely with minimal use of hands    Transfers Able to transfer safely, definite need of hands    Standing Unsupported with Eyes Closed Able to stand 10 seconds safely    Standing Unsupported with Feet Together Able to place feet together independently and stand 1 minute safely    From Standing, Reach Forward with Outstretched Arm Can reach forward >12 cm safely (5")    From Standing Position, Pick up Object from Floor Able to pick up shoe, needs supervision    From Standing Position, Turn to Look Behind Over each Shoulder Looks behind one side only/other side shows less weight shift    Turn 360 Degrees Able to turn 360 degrees safely one side only in 4 seconds or less    Standing Unsupported, Alternately Place Feet on Step/Stool Able to stand independently and complete 8 steps >20 seconds    Standing Unsupported, One Foot in Front Able to take small step independently and hold 30 seconds    Standing on One Leg Tries to lift leg/unable to hold 3 seconds but remains standing independently    Total Score 45                         [facility] Adult PT Treatment/Exercise - 03/23/20 0001      Ambulation/Gait   Gait Comments gait 150 feet without rest      High Level Balance   High Level Balance Comments airex balance bean fwd, side to side, balance activities standing, reaching, eyes closed, tandem stance      Lumbar Exercises: Aerobic   Nustep level 5 x 6 minutes      Lumbar Exercises: Standing   Other Standing Lumbar Exercises 2.5# hip marching, abduction and extension 10x each leg  all while standing on airex, HS curls                    PT Short Term Goals - 12/04/19 1627      PT SHORT TERM GOAL #1   Title independent with intial HEP    Status Achieved             PT Long Term Goals - 03/18/20 1435      PT LONG TERM GOAL #1  Title increase overall hip LE strength for functional gait and safety hips and ankles to 4+/5    Status Partially Met      PT LONG TERM GOAL #2   Title decrease TUG time to less than 14 seconds for functional gait and safety    Status Partially Met      PT LONG TERM GOAL #3   Title increase BERG to 50/56 for safety and independence    Status Partially Met      PT LONG TERM GOAL #4   Title decrease LBP 50% with activities    Status Achieved                 Plan - 03/23/20 1612    Clinical Impression Statement O2 saturation better today, was b/n 81-86%, she did have an issue at the end of the treatment that after the walk her HR was 49 bpm.  I had her stay and rest and it went up to 70 prior to her leaving.  She did gain points on the Berg balance test hopefully decreasing the risk for falls    PT Next Visit Plan see how things are going closely monitor HR and O2 saturation    Consulted and Agree with Plan of Care Patient           Patient will benefit from skilled therapeutic intervention in order to improve the following deficits and impairments:  Abnormal gait, Decreased range of motion, Difficulty walking, Increased muscle spasms, Decreased activity tolerance, Pain, Decreased balance, Impaired flexibility, Improper body mechanics, Postural dysfunction, Decreased strength, Decreased mobility  Visit Diagnosis: Acute bilateral low back pain without sciatica  Other abnormalities of gait and mobility  Muscle spasm of back  Muscle weakness (generalized)  Risk for falls  Difficulty in walking, not elsewhere classified  Physical debility     Problem List Patient Active Problem List   Diagnosis  Date Noted  . Hypoxia 03/23/2020  . Acute cystitis with hematuria 12/19/2019  . Hypomagnesemia 07/11/2017  . Nausea vomiting and diarrhea 07/10/2017  . Hypokalemia 07/10/2017  . Chronic diastolic CHF (congestive heart failure) ([place]) 07/10/2017  . Seizure ([place]) 07/10/2017  . OSA (obstructive sleep apnea) 07/10/2017  . Sepsis ([place]) 07/10/2017  . Seizure disorder ([place]) 07/05/2017  . Pulmonary HTN ([place]) 07/05/2017  . Anxiety state   . Physical debility 06/23/2017  . 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 History of acute respiratory failure   . Tracheostomy, acute management ([place])   . Aspiration pneumonia ([place])   . Encephalopathy   . Status epilepticus ([place])   . Benign essential HTN   . 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 History of pulmonary embolism   . Acute blood loss anemia   . Diverticulitis of colon   . Tachypnea   . Encounter for orogastric (OG) tube placement   . Encounter for central line placement   . Encounter for attention to tracheostomy ([place])   . Acute respiratory failure with hypoxia ([place])   . Acute encephalopathy 06/06/2017  . Diverticulitis of large intestine without perforation or abscess without bleeding   . Abdominal pain   . Non-intractable cyclical vomiting with nausea   . Abnormal CT of the abdomen   . Hypocalcemia 05/27/2017  . 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 Colitis 05/27/2017  . Central retinal vein occlusion of right eye 04/20/2016  . Nuclear sclerotic cataract of both eyes 04/20/2016  . Diabetic foot infection ([place]) 07/21/2015  . Diabetes mellitus  type 2, controlled ([place]) 07/21/2015  . Cellulitis of foot, right 07/21/2015  . Cellulitis of right foot 07/21/2015  . Acute pulmonary embolism ([place]) 04/01/2013  . DVT (deep venous thrombosis) ([place]) 04/01/2013  . HX: breast cancer 04/01/2013  . Concussion 04/01/2013  . Diabetes ([place]) 04/01/2013  . Essential hypertension, benign 04/01/2013  . Hypothyroidism 04/01/2013  . OSA on CPAP  04/01/2013    [name] Boast., PT 03/23/2020, 4:14 PM  [address] [place], [name], [address] Phone: [phone]   Fax:  [phone]  Name: Amanda Short MRN: [id number] Date of Birth: 1941/03/19

## 2020-03-23 NOTE — Progress Notes (Signed)
Established Patient Office Visit  Subjective:  Patient ID: Amanda Short, female    DOB: 03-Aug-1940  Age: 79 y.o. MRN: [id number]  CC:  Chief Complaint  Patient presents with  . Follow-up    Cystitis, burnign when urinating/no blood, pt needs an order for a portable O2 machine.      HPI Amanda Short presents for mild dysuria.  There is some increased frequency.  Denies fever chills nausea or vomiting.  Requests a prescription for a portable rechargeable O2 machine.  She is using oxygen at night already.  History of pulmonary hypertension.  Past medical history pulmonary embolism and acute respiratory failure.  Referral with pulmonology is pending.  Past Medical History:  Diagnosis Date  . Abdominal pain   . Abnormal CT of the abdomen   . Acute blood loss anemia   . Acute cystitis with hematuria 12/19/2019  . Acute encephalopathy 06/06/2017  . Acute pulmonary embolism ([place]) 04/01/2013  . Acute respiratory failure with hypoxia ([place])   . Anxiety state   . Arthritis    "back, hands" (07/22/2015)  . Aspiration pneumonia ([place])   . Benign essential HTN   . Cancer of left breast ([place]) 2006   S/P lumpectomy  . Cellulitis of foot, right 07/21/2015  . Cellulitis of right foot 07/21/2015  . Central retinal vein occlusion of right eye 04/20/2016  . Chronic diastolic CHF (congestive heart failure) ([place]) 07/10/2017  . Colitis 05/27/2017  . Complication of anesthesia    "brief breathing problem at surgery center in ~ 2005 when I had gallbladder OR"  . Concussion 03/2013   due to fall  . Depression   . Diabetes ([place]) 04/01/2013  . 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 Diabetes mellitus type 2, controlled ([place]) 07/21/2015  . Diabetic foot infection ([place]) 07/21/2015  . Diverticulitis of colon   . Diverticulitis of large intestine without perforation or abscess without bleeding   . Diverticulosis   . DVT (deep venous thrombosis) ([place]) 03/2013   LLE  . Encephalopathy   . Encounter  for attention to tracheostomy ([place])   . Encounter for central line placement   . Encounter for orogastric (OG) tube placement   . Essential hypertension, benign 04/01/2013  . GERD (gastroesophageal reflux disease)   . History of acute respiratory failure   . History of pulmonary embolism   . HX: breast cancer 04/01/2013  . Hyperlipidemia   . Hypertension   . Hypocalcemia 05/27/2017  . Hypokalemia 07/10/2017  . Hypomagnesemia 07/11/2017  . Hypothyroidism   . Nausea vomiting and diarrhea 07/10/2017  . Non-intractable cyclical vomiting with nausea   . Nuclear sclerotic cataract of both eyes 04/20/2016  . OSA (obstructive sleep apnea) 07/10/2017  . OSA on CPAP 04/01/2013  . OSA treated with BiPAP   . Physical debility 06/23/2017  . 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 Pulmonary embolism ([employer]) 03/2013  . Pulmonary HTN ([place]) 07/05/2017  . Seizure ([place]) 07/10/2017  . Seizure disorder ([place]) 07/05/2017  . Seizures ([place])   . Sepsis ([place]) 07/10/2017  . Sleep apnea   . Status epilepticus ([place])   . 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 Tachypnea   . Thyroid disease    Hypothyroid  . Tracheostomy, acute management ([place])   . Type II diabetes mellitus ([place])     Past Surgical History:  Procedure Laterality Date  . ABDOMINAL HYSTERECTOMY  1970s  . BREAST BIOPSY Left 2006  . BREAST LUMPECTOMY Left 2006  . BUNIONECTOMY  WITH HAMMERTOE RECONSTRUCTION Left   . ESOPHAGOGASTRODUODENOSCOPY N/A 05/29/2017   Procedure: ESOPHAGOGASTRODUODENOSCOPY (EGD);  Surgeon: Irene Shipper, MD;  Location: [facility];  Service: Endoscopy;  Laterality: N/A;  . JOINT REPLACEMENT    . LAPAROSCOPIC CHOLECYSTECTOMY  ~ 2005  . TOTAL HIP ARTHROPLASTY Left 2010  . TOTAL KNEE ARTHROPLASTY Bilateral 2005-2006    Family History  Problem Relation Age of Onset  . Diabetes Other   . Breast cancer Sister   . Liver cancer Mother   . Uterine cancer Mother   . Hypertension Mother   . Colon cancer Maternal Grandmother   .  Breast cancer Cousin        couple  . Kidney disease Sister   . Diabetes Sister     Social History   Socioeconomic History  . Marital status: Married    Spouse name: Not on file  . Number of children: 1  . Years of education: Not on file  . Highest education level: Not on file  Occupational History  . Occupation: retired  Tobacco Use  . Smoking status: Former Smoker    Packs/day: 1.00    Years: 30.00    Pack years: 30.00    Types: Cigarettes    Quit date: 1995    Years since quitting: 26.7  . Smokeless tobacco: Never Used  . Tobacco comment: "quit smoking cigarettes in the 1990s"  Vaping Use  . Vaping Use: Never used  Substance and Sexual Activity  . Alcohol use: No  . Drug use: No  . Sexual activity: Not Currently  Other Topics Concern  . Not on file  Social History Narrative  . Not on file   Social Determinants of Health   Financial Resource Strain: Low Risk   . Difficulty of Paying Living Expenses: Not hard at all  Food Insecurity: No Food Insecurity  . Worried About Charity fundraiser in the Last Year: Never true  . Ran Out of Food in the Last Year: Never true  Transportation Needs: No Transportation Needs  . Lack of Transportation (Medical): No  . Lack of Transportation (Non-Medical): No  Physical Activity: Insufficiently Active  . Days of Exercise per Week: 4 days  . Minutes of Exercise per Session: 30 min  Stress: No Stress Concern Present  . Feeling of Stress : Not at all  Social Connections: Moderately Isolated  . Frequency of Communication with Friends and Family: More than three times a week  . Frequency of Social Gatherings with Friends and Family: Never  . Attends Religious Services: Never  . Active Member of Clubs or Organizations: No  . Attends Archivist Meetings: Never  . Marital Status: Married  Human resources officer Violence: Not At Risk  . Fear of Current or Ex-Partner: No  . Emotionally Abused: No  . Physically Abused: No  .  Sexually Abused: No    Outpatient Medications Prior to Visit  Medication Sig Dispense Refill  . acetaminophen (TYLENOL) 500 MG tablet Take 500 mg by mouth every 6 (six) hours as needed for mild pain.    Marland Kitchen ALPRAZolam (XANAX) 0.5 MG tablet Take 0.5 mg by mouth at bedtime as needed for anxiety.    . BD PEN NEEDLE NANO 2ND GEN 32G X 4 MM MISC daily.    Marland Kitchen diltiazem (CARDIZEM) 30 MG tablet Take 30 mg by mouth daily.     . DULoxetine (CYMBALTA) 60 MG capsule Take 60 mg by mouth daily.    . febuxostat (ULORIC) 40  MG tablet Take 40 mg daily by mouth.    . furosemide (LASIX) 40 MG tablet Take 40 mg by mouth daily.    . insulin degludec (TRESIBA FLEXTOUCH) 100 UNIT/ML SOPN FlexTouch Pen     . levETIRAcetam (KEPPRA) 500 MG tablet Take 500 mg by mouth 2 (two) times daily.    Marland Kitchen levothyroxine (SYNTHROID, LEVOTHROID) 100 MCG tablet Take 100 mcg by mouth daily before breakfast.    . metoprolol (TOPROL-XL) 200 MG 24 hr tablet Take 100 mg by mouth at bedtime.     . montelukast (SINGULAIR) 10 MG tablet Take 10 mg at bedtime by mouth.    . Multiple Vitamin (MULTIVITAMIN WITH MINERALS) TABS tablet Take 1 tablet daily by mouth.    . mupirocin ointment (BACTROBAN) 2 % Apply to right foot once daily. 30 g 1  . nateglinide (STARLIX) 120 MG tablet Take 120 mg by mouth 3 (three) times daily with meals.     Marland Kitchen omeprazole (PRILOSEC) 40 MG capsule Take 40 mg by mouth 2 (two) times daily.    Marland Kitchen OZEMPIC, 0.25 OR 0.5 MG/DOSE, 2 MG/1.5ML SOPN Inject 0.5 mg into the skin once a week.    . Rivaroxaban (XARELTO) 15 MG TABS tablet Take 15 mg by mouth daily.    Marland Kitchen spironolactone (ALDACTONE) 25 MG tablet Take 12.5 mg by mouth daily.    Marland Kitchen atorvastatin (LIPITOR) 20 MG tablet Take 20 mg by mouth daily.    . calcium-vitamin D (OSCAL 500/200 D-3) 500-200 MG-UNIT tablet Take 1 tablet 2 (two) times daily by mouth. 20 tablet 0   No facility-administered medications prior to visit.    Allergies  Allergen Reactions  . Allopurinol Nausea  And Vomiting    ROS Review of Systems  Constitutional: Negative.   HENT: Negative.   Respiratory: Positive for shortness of breath. Negative for chest tightness and wheezing.   Cardiovascular: Negative.   Gastrointestinal: Negative.   Genitourinary: Positive for dysuria and frequency. Negative for urgency.  Musculoskeletal: Negative.   Neurological: Negative.   Psychiatric/Behavioral: Negative.       Objective:    Physical Exam Vitals and nursing note reviewed.  Constitutional:      General: She is not in acute distress.    Appearance: She is ill-appearing. She is not toxic-appearing or diaphoretic.  HENT:     Head: Normocephalic and atraumatic.     Right Ear: External ear normal.     Left Ear: External ear normal.  Eyes:     General:        Right eye: No discharge.        Left eye: No discharge.     Conjunctiva/sclera: Conjunctivae normal.     Pupils: Pupils are equal, round, and reactive to light.  Cardiovascular:     Rate and Rhythm: Normal rate and regular rhythm.  Pulmonary:     Effort: Pulmonary effort is normal. No respiratory distress.     Breath sounds: Normal breath sounds. No stridor. No wheezing or rhonchi.  Abdominal:     General: Bowel sounds are normal.  Musculoskeletal:     Cervical back: Normal range of motion and neck supple.     Right lower leg: No edema.     Left lower leg: No edema.  Skin:    General: Skin is warm and dry.  Neurological:     General: No focal deficit present.     Mental Status: She is alert and oriented to person, place, and time.  Psychiatric:  Mood and Affect: Mood normal.        Behavior: Behavior normal.     BP 126/70   Pulse 65   Temp (!) 96.9 F (36.1 C) (Temporal)   Ht _0  (1.549 m)   Wt 157 lb 3.2 oz (71.3 kg)   BMI 29.70 kg/m  Wt Readings from Last 3 Encounters:  03/23/20 157 lb 3.2 oz (71.3 kg)  03/19/20 156 lb (70.8 kg)  02/25/20 150 lb (68 kg)     Health Maintenance Due  Topic Date Due    . Hepatitis C Screening  Never done  . FOOT EXAM  Never done  . URINE MICROALBUMIN  Never done  . DEXA SCAN  Never done  . PNA vac Low Risk Adult (2 of 2 - PCV13) 06/20/2016  . HEMOGLOBIN A1C  01/09/2018  . INFLUENZA VACCINE  02/15/2020    There are no preventive care reminders to display for this patient.  Lab Results  Component Value Date   TSH 0.190 (L) 02/16/2020   Lab Results  Component Value Date   WBC 9.9 02/16/2020   HGB 16.1 (H) 02/16/2020   HCT 47.6 (H) 02/16/2020   MCV 95 02/16/2020   PLT 245 02/16/2020   Lab Results  Component Value Date   NA 142 02/16/2020   K 5.3 (H) 02/16/2020   CO2 25 02/16/2020   GLUCOSE 136 (H) 02/16/2020   BUN 27 02/16/2020   CREATININE 1.18 (H) 02/16/2020   BILITOT 0.4 02/16/2020   ALKPHOS 104 02/16/2020   AST 16 02/16/2020   ALT 8 02/16/2020   PROT 6.7 02/16/2020   ALBUMIN 4.4 02/16/2020   CALCIUM 10.0 02/16/2020   ANIONGAP 11 07/12/2017   GFR 36.93 (L) 09/05/2019   Lab Results  Component Value Date   CHOL 131 07/11/2017   Lab Results  Component Value Date   HDL 46 07/11/2017   Lab Results  Component Value Date   LDLCALC 56 07/11/2017   Lab Results  Component Value Date   TRIG 144 07/11/2017   Lab Results  Component Value Date   CHOLHDL 2.8 07/11/2017   Lab Results  Component Value Date   HGBA1C 7.0 (H) 07/11/2017      Assessment & Plan:   Problem List Items Addressed This Visit      Respiratory   Hypoxia     Genitourinary   Acute cystitis with hematuria - Primary   Relevant Orders   POCT urinalysis dipstick (Completed)    Other Visit Diagnoses    Dysuria       Relevant Orders   Urinalysis, Routine w reflex microscopic   Urine Culture      No orders of the defined types were placed in this encounter.   Follow-up: Return in about 3 months (around 06/22/2020), or if symptoms worsen or fail to improve.   She is awaiting consultation with pulmonology.  Pharmacy will send [place] information  needed about a rechargeable portable oxygen system. Libby Maw, MD

## 2020-03-24 LAB — URINE CULTURE
MICRO NUMBER:: 10917455
Result:: NO GROWTH
SPECIMEN QUALITY:: ADEQUATE

## 2020-03-29 ENCOUNTER — Ambulatory Visit: Payer: Medicare PPO | Admitting: Podiatry

## 2020-03-29 ENCOUNTER — Other Ambulatory Visit: Payer: Self-pay

## 2020-03-29 DIAGNOSIS — M79671 Pain in right foot: Secondary | ICD-10-CM

## 2020-03-29 NOTE — Progress Notes (Signed)
HPI: 79 y.o. female presenting today for evaluation of right great toe pain.  Patient states that she does have a history of an ulcer that developed to the plantar aspect of the right great toe.  Today she has noticed a small skin lesion noted to the dorsal aspect of the right great toe and would like to have it checked out.  She has been applying antibiotic ointment and a Band-Aid daily.  She states that at night she experiences nocturnal toe pain to the right great toe.  She presents for further treatment and evaluation  Past Medical History:  Diagnosis Date   Abdominal pain    Abnormal CT of the abdomen    Acute blood loss anemia    Acute cystitis with hematuria 12/19/2019   Acute encephalopathy 06/06/2017   Acute pulmonary embolism ([place]) 04/01/2013   Acute respiratory failure with hypoxia (HCC)    Anxiety state    Arthritis    "back, hands" (07/22/2015)   Aspiration pneumonia (HCC)    Benign essential HTN    Cancer of left breast ([place]) 2006   S/P lumpectomy   Cellulitis of foot, right 07/21/2015   Cellulitis of right foot 07/21/2015   Central retinal vein occlusion of right eye 04/20/2016   Chronic diastolic CHF (congestive heart failure) ([place]) 07/10/2017   Colitis 29/47/6546   Complication of anesthesia    "brief breathing problem at surgery center in ~ 2005 when I had gallbladder OR"   Concussion 03/2013   due to fall   Depression    Diabetes ([place]) 04/01/2013   Diabetes mellitus type 2 in nonobese [facility])    Diabetes mellitus type 2, controlled ([place]) 07/21/2015   Diabetic foot infection ([place]) 07/21/2015   Diverticulitis of colon    Diverticulitis of large intestine without perforation or abscess without bleeding    Diverticulosis    DVT (deep venous thrombosis) ([place]) 03/2013   LLE   Encephalopathy    Encounter for attention to tracheostomy [facility])    Encounter for central line placement    Encounter for orogastric (OG) tube placement    Essential  hypertension, benign 04/01/2013   GERD (gastroesophageal reflux disease)    History of acute respiratory failure    History of pulmonary embolism    HX: breast cancer 04/01/2013   Hyperlipidemia    Hypertension    Hypocalcemia 05/27/2017   Hypokalemia 07/10/2017   Hypomagnesemia 07/11/2017   Hypothyroidism    Nausea vomiting and diarrhea 07/10/2017   Non-intractable cyclical vomiting with nausea    Nuclear sclerotic cataract of both eyes 04/20/2016   OSA (obstructive sleep apnea) 07/10/2017   OSA on CPAP 04/01/2013   OSA treated with BiPAP    Physical debility 06/23/2017   Prolonged QT interval 05/27/2017   Pulmonary embolism ([place]) 03/2013   Pulmonary HTN ([place]) 07/05/2017   Seizure ([place]) 07/10/2017   Seizure disorder ([place]) 07/05/2017   Seizures ([place])    Sepsis ([place]) 07/10/2017   Sleep apnea    Status epilepticus ([place])    Status post trachelectomy 06/23/2017   Tachypnea    Thyroid disease    Hypothyroid   Tracheostomy, acute management ([place])    Type II diabetes mellitus ([place])      Physical Exam: General: The patient is alert and oriented x3 in no acute distress.  Dermatology: Skin is warm, dry and supple bilateral lower extremities. Negative for open lesions or macerations.  There is a very small superficial lesion noted to the dorsal aspect of  the right great toe limited to breakdown of skin.  It measures approximately 0.1 x 0.1 x 0.1 cm.  Vascular: Although the pedal pulses are palpable the toes are cold to touch with a delayed capillary refill.  Findings may be suggestive of small vessel ischemia which would answer the patient's nocturnal toe pain when the foot is in an elevated position  Neurological: Epicritic and protective threshold grossly intact bilaterally.   Musculoskeletal Exam: Range of motion within normal limits to all pedal and ankle joints bilateral. Muscle strength 5/5 in all groups bilateral.   Assessment: 1.  Nocturnal toe pain  right great toe 2.  Superficial skin lesion right great toe limited to breakdown of skin   Plan of Care:  1. Patient evaluated.  2.  Continue antibiotic ointment and a Band-Aid daily 3.  Recommend OTC Biofreeze nightly 4.  Return to clinic on neck scheduled appointment for routine foot care     Edrick Kins, DPM [facility]  Dr. Edrick Kins, DPM    [address], [place] [address]                Office [phone]  Fax [phone]

## 2020-03-30 ENCOUNTER — Encounter: Payer: Self-pay | Admitting: Physical Therapy

## 2020-03-30 ENCOUNTER — Ambulatory Visit: Payer: Medicare PPO | Admitting: Physical Therapy

## 2020-03-30 DIAGNOSIS — M545 Low back pain, unspecified: Secondary | ICD-10-CM

## 2020-03-30 DIAGNOSIS — R2689 Other abnormalities of gait and mobility: Secondary | ICD-10-CM

## 2020-03-30 DIAGNOSIS — Z9181 History of falling: Secondary | ICD-10-CM

## 2020-03-30 DIAGNOSIS — M6281 Muscle weakness (generalized): Secondary | ICD-10-CM

## 2020-03-30 DIAGNOSIS — M62830 Muscle spasm of back: Secondary | ICD-10-CM

## 2020-03-30 DIAGNOSIS — R5381 Other malaise: Secondary | ICD-10-CM

## 2020-03-30 DIAGNOSIS — R262 Difficulty in walking, not elsewhere classified: Secondary | ICD-10-CM

## 2020-03-30 NOTE — Therapy (Signed)
[address] [place], [name], [address] Phone: [phone]   Fax:  [phone]  Physical Therapy Treatment  Patient Details  Name: Amanda Short MRN: [id number] Date of Birth: 07-Jun-1941 Referring Provider (PT): Rennard   Encounter Date: 03/30/2020   PT End of Session - 03/30/20 1427    Visit Number 28    Date for PT Re-Evaluation 04/10/20    Authorization Type [employer]    PT Start Time 1342    PT Stop Time 1430    PT Time Calculation (min) 48 min    Activity Tolerance Patient tolerated treatment well    Behavior During Therapy [facility] for tasks assessed/performed           Past Medical History:  Diagnosis Date  . Abdominal pain   . Abnormal CT of the abdomen   . Acute blood loss anemia   . Acute cystitis with hematuria 12/19/2019  . Acute encephalopathy 06/06/2017  . Acute pulmonary embolism ([place]) 04/01/2013  . Acute respiratory failure with hypoxia ([place])   . Anxiety state   . Arthritis    "back, hands" (07/22/2015)  . Aspiration pneumonia ([place])   . Benign essential HTN   . Cancer of left breast ([place]) 2006   S/P lumpectomy  . Cellulitis of foot, right 07/21/2015  . Cellulitis of right foot 07/21/2015  . Central retinal vein occlusion of right eye 04/20/2016  . Chronic diastolic CHF (congestive heart failure) ([place]) 07/10/2017  . Colitis 05/27/2017  . Complication of anesthesia    "brief breathing problem at surgery center in ~ 2005 when I had gallbladder OR"  . Concussion 03/2013   due to fall  . Depression   . Diabetes ([place]) 04/01/2013  . 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 Diabetes mellitus type 2, controlled ([place]) 07/21/2015  . Diabetic foot infection ([place]) 07/21/2015  . Diverticulitis of colon   . Diverticulitis of large intestine without perforation or abscess without bleeding   . Diverticulosis   . DVT (deep venous thrombosis) ([place]) 03/2013   LLE  . Encephalopathy   . Encounter for  attention to tracheostomy ([place])   . Encounter for central line placement   . Encounter for orogastric (OG) tube placement   . Essential hypertension, benign 04/01/2013  . GERD (gastroesophageal reflux disease)   . History of acute respiratory failure   . History of pulmonary embolism   . HX: breast cancer 04/01/2013  . Hyperlipidemia   . Hypertension   . Hypocalcemia 05/27/2017  . Hypokalemia 07/10/2017  . Hypomagnesemia 07/11/2017  . Hypothyroidism   . Nausea vomiting and diarrhea 07/10/2017  . Non-intractable cyclical vomiting with nausea   . Nuclear sclerotic cataract of both eyes 04/20/2016  . OSA (obstructive sleep apnea) 07/10/2017  . OSA on CPAP 04/01/2013  . OSA treated with BiPAP   . Physical debility 06/23/2017  . 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 Pulmonary embolism ([place]) 03/2013  . Pulmonary HTN ([place]) 07/05/2017  . Seizure ([place]) 07/10/2017  . Seizure disorder ([place]) 07/05/2017  . Seizures ([place])   . Sepsis ([place]) 07/10/2017  . Sleep apnea   . Status epilepticus ([place])   . 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 Tachypnea   . Thyroid disease    Hypothyroid  . Tracheostomy, acute management ([place])   . Type II diabetes mellitus ([place])     Past Surgical History:  Procedure Laterality Date  . ABDOMINAL HYSTERECTOMY  1970s  .  BREAST BIOPSY Left 2006  . BREAST LUMPECTOMY Left 2006  . BUNIONECTOMY WITH HAMMERTOE RECONSTRUCTION Left   . ESOPHAGOGASTRODUODENOSCOPY N/A 05/29/2017   Procedure: ESOPHAGOGASTRODUODENOSCOPY (EGD);  Surgeon: Irene Shipper, MD;  Location: [facility];  Service: Endoscopy;  Laterality: N/A;  . JOINT REPLACEMENT    . LAPAROSCOPIC CHOLECYSTECTOMY  ~ 2005  . TOTAL HIP ARTHROPLASTY Left 2010  . TOTAL KNEE ARTHROPLASTY Bilateral 2005-2006    There were no vitals filed for this visit.   Subjective Assessment - 03/30/20 1354    Subjective Reports that she has not heard from the pulmonologist, she reports that she feels wobbly, no pain    Currently in  Pain? No/denies              [facility] PT Assessment - 03/30/20 0001      6 Minute Walk- Baseline   6 Minute Walk- Baseline yes    HR (bpm) 85    02 Sat (%RA) 85 %    Modified Borg Scale for Dyspnea 3- Moderate shortness of breath or breathing difficulty      6 Minute walk- Post Test   6 Minute Walk Post Test yes    HR (bpm) 88    02 Sat (%RA) 82 %    Modified Borg Scale for Dyspnea 8-      6 minute walk test results    Aerobic Endurance Distance Walked 160    Endurance additional comments she had to stop and rest at 1 minute 18 seconds      Standardized Balance Assessment   Standardized Balance Assessment Timed Up and Go Test      Timed Up and Go Test   Normal TUG (seconds) 19                         [facility] Adult PT Treatment/Exercise - 03/30/20 0001      High Level Balance   High Level Balance Activities Side stepping;Backward walking;Direction changes;Negotiating over obstacles;Negotitating around obstacles      Lumbar Exercises: Aerobic   UBE (Upper Arm Bike) level 5 x 4 minutes    Nustep level 5 x 5 minutes                    PT Short Term Goals - 12/04/19 1627      PT SHORT TERM GOAL #1   Title independent with intial HEP    Status Achieved             PT Long Term Goals - 03/18/20 1435      PT LONG TERM GOAL #1   Title increase overall hip LE strength for functional gait and safety hips and ankles to 4+/5    Status Partially Met      PT LONG TERM GOAL #2   Title decrease TUG time to less than 14 seconds for functional gait and safety    Status Partially Met      PT LONG TERM GOAL #3   Title increase BERG to 50/56 for safety and independence    Status Partially Met      PT LONG TERM GOAL #4   Title decrease LBP 50% with activities    Status Achieved                 Plan - 03/30/20 1428    Clinical Impression Statement Patient reports that last week she had to use oxygen at 3L multiple time a day.  She reports  that prior to last week she rarely used the oxygen, her O2 saturation rates are much higher this week ranging from 82-88%.  She does seem more fatigued and short of breath.  The 6 minute walk test was 1 minute 18 seconds and went 160 feet, due to fatigue.    PT Next Visit Plan may need to see what we need to do for further treatment, as we will have to ask for more visits    Consulted and Agree with Plan of Care Patient           Patient will benefit from skilled therapeutic intervention in order to improve the following deficits and impairments:  Abnormal gait, Decreased range of motion, Difficulty walking, Increased muscle spasms, Decreased activity tolerance, Pain, Decreased balance, Impaired flexibility, Improper body mechanics, Postural dysfunction, Decreased strength, Decreased mobility  Visit Diagnosis: Acute bilateral low back pain without sciatica  Other abnormalities of gait and mobility  Muscle spasm of back  Muscle weakness (generalized)  Risk for falls  Difficulty in walking, not elsewhere classified  Physical debility     Problem List Patient Active Problem List   Diagnosis Date Noted  . Hypoxia 03/23/2020  . Acute cystitis with hematuria 12/19/2019  . Hypomagnesemia 07/11/2017  . Nausea vomiting and diarrhea 07/10/2017  . Hypokalemia 07/10/2017  . Chronic diastolic CHF (congestive heart failure) ([place]) 07/10/2017  . Seizure ([place]) 07/10/2017  . OSA (obstructive sleep apnea) 07/10/2017  . Sepsis ([place]) 07/10/2017  . Seizure disorder ([place]) 07/05/2017  . Pulmonary HTN ([place]) 07/05/2017  . Anxiety state   . Physical debility 06/23/2017  . 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 History of acute respiratory failure   . Tracheostomy, acute management ([place])   . Aspiration pneumonia ([place])   . Encephalopathy   . Status epilepticus ([place])   . Benign essential HTN   . 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 History of pulmonary embolism   . Acute blood loss  anemia   . Diverticulitis of colon   . Tachypnea   . Encounter for orogastric (OG) tube placement   . Encounter for central line placement   . Encounter for attention to tracheostomy ([place])   . Acute respiratory failure with hypoxia ([place])   . Acute encephalopathy 06/06/2017  . Diverticulitis of large intestine without perforation or abscess without bleeding   . Abdominal pain   . Non-intractable cyclical vomiting with nausea   . Abnormal CT of the abdomen   . Hypocalcemia 05/27/2017  . 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 Colitis 05/27/2017  . Central retinal vein occlusion of right eye 04/20/2016  . Nuclear sclerotic cataract of both eyes 04/20/2016  . Diabetic foot infection ([place]) 07/21/2015  . Diabetes mellitus type 2, controlled ([place]) 07/21/2015  . Cellulitis of foot, right 07/21/2015  . Cellulitis of right foot 07/21/2015  . Acute pulmonary embolism ([place]) 04/01/2013  . DVT (deep venous thrombosis) ([place]) 04/01/2013  . HX: breast cancer 04/01/2013  . Concussion 04/01/2013  . Diabetes ([place]) 04/01/2013  . Essential hypertension, benign 04/01/2013  . Hypothyroidism 04/01/2013  . OSA on CPAP 04/01/2013    [name] Boast., PT 03/30/2020, 2:37 PM  [address] [place], [name], [address] Phone: [phone]   Fax:  [phone]  Name: Amanda Short MRN: [id number] Date of Birth: 1941-04-15

## 2020-04-01 ENCOUNTER — Encounter: Payer: Self-pay | Admitting: Physical Therapy

## 2020-04-01 ENCOUNTER — Other Ambulatory Visit: Payer: Self-pay

## 2020-04-01 ENCOUNTER — Ambulatory Visit: Payer: Medicare PPO | Admitting: Physical Therapy

## 2020-04-01 DIAGNOSIS — R5381 Other malaise: Secondary | ICD-10-CM

## 2020-04-01 DIAGNOSIS — M545 Low back pain: Secondary | ICD-10-CM | POA: Diagnosis not present

## 2020-04-01 DIAGNOSIS — R262 Difficulty in walking, not elsewhere classified: Secondary | ICD-10-CM

## 2020-04-01 DIAGNOSIS — M6281 Muscle weakness (generalized): Secondary | ICD-10-CM

## 2020-04-01 DIAGNOSIS — Z9181 History of falling: Secondary | ICD-10-CM

## 2020-04-01 NOTE — Therapy (Signed)
[place] [address] [place], [name], [address] Phone: [phone]   Fax:  [phone]  Physical Therapy Treatment  Patient Details  Name: Amanda Short MRN: [id number] Date of Birth: [birth year] Referring Provider (PT): Rennard   Encounter Date: 04/01/2020   PT End of Session - 04/01/20 1430    Visit Number 29    Date for PT Re-Evaluation 04/10/20    Authorization Type [employer]    PT Start Time 6962    PT Stop Time 1430    PT Time Calculation (min) 45 min    Activity Tolerance Patient tolerated treatment well;Patient limited by fatigue    Behavior During Therapy [facility] for tasks assessed/performed           Past Medical History:  Diagnosis Date  . Abdominal pain   . Abnormal CT of the abdomen   . Acute blood loss anemia   . Acute cystitis with hematuria 12/19/2019  . Acute encephalopathy 06/06/2017  . Acute pulmonary embolism ([place]) 04/01/2013  . Acute respiratory failure with hypoxia ([place])   . Anxiety state   . Arthritis    "back, hands" (07/22/2015)  . Aspiration pneumonia ([place])   . Benign essential HTN   . Cancer of left breast ([place]) 2006   S/P lumpectomy  . Cellulitis of foot, right 07/21/2015  . Cellulitis of right foot 07/21/2015  . Central retinal vein occlusion of right eye 04/20/2016  . Chronic diastolic CHF (congestive heart failure) ([place]) 07/10/2017  . Colitis 05/27/2017  . Complication of anesthesia    "brief breathing problem at surgery center in ~ 2005 when I had gallbladder OR"  . Concussion 03/2013   due to fall  . Depression   . Diabetes ([place]) 04/01/2013  . 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 Diabetes mellitus type 2, controlled ([place]) 07/21/2015  . Diabetic foot infection ([place]) 07/21/2015  . Diverticulitis of colon   . Diverticulitis of large intestine without perforation or abscess without bleeding   . Diverticulosis   . DVT (deep venous thrombosis) ([place]) 03/2013   LLE  .  Encephalopathy   . Encounter for attention to tracheostomy ([place])   . Encounter for central line placement   . Encounter for orogastric (OG) tube placement   . Essential hypertension, benign 04/01/2013  . GERD (gastroesophageal reflux disease)   . History of acute respiratory failure   . History of pulmonary embolism   . HX: breast cancer 04/01/2013  . Hyperlipidemia   . Hypertension   . Hypocalcemia 05/27/2017  . Hypokalemia 07/10/2017  . Hypomagnesemia 07/11/2017  . Hypothyroidism   . Nausea vomiting and diarrhea 07/10/2017  . Non-intractable cyclical vomiting with nausea   . Nuclear sclerotic cataract of both eyes 04/20/2016  . OSA (obstructive sleep apnea) 07/10/2017  . OSA on CPAP 04/01/2013  . OSA treated with BiPAP   . Physical debility 06/23/2017  . 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 Pulmonary embolism ([place]) 03/2013  . Pulmonary HTN ([place]) 07/05/2017  . Seizure ([place]) 07/10/2017  . Seizure disorder ([place]) 07/05/2017  . Seizures ([place])   . Sepsis ([place]) 07/10/2017  . Sleep apnea   . Status epilepticus ([place])   . 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 Tachypnea   . Thyroid disease    Hypothyroid  . Tracheostomy, acute management ([place])   . Type II diabetes mellitus ([place])     Past Surgical History:  Procedure Laterality Date  . ABDOMINAL HYSTERECTOMY  1970s  . BREAST BIOPSY Left 2006  . BREAST LUMPECTOMY Left 2006  . BUNIONECTOMY WITH HAMMERTOE RECONSTRUCTION Left   . ESOPHAGOGASTRODUODENOSCOPY N/A 05/29/2017   Procedure: ESOPHAGOGASTRODUODENOSCOPY (EGD);  Surgeon: Irene Shipper, MD;  Location[facility];  Service: Endoscopy;  Laterality: N/A;  . JOINT REPLACEMENT    . LAPAROSCOPIC CHOLECYSTECTOMY  ~ 2005  . TOTAL HIP ARTHROPLASTY Left 2010  . TOTAL KNEE ARTHROPLASTY Bilateral 2005-2006    There were no vitals filed for this visit.   Subjective Assessment - 04/01/20 1356    Subjective Still awaiting pulmonologist call and or appointment, reports that yesterday she  had an episode yesterday where her O2 sturation was 77%, she then used O2 at 3L    Currently in Pain? No/denies                             [facility] Adult PT Treatment/Exercise - 04/01/20 0001      Ambulation/Gait   Gait Comments tried walking one lap in the clinic about 75 feet, HR and O2 are up and down, but this is about her limit today due to a "feeling of fatigue".      High Level Balance   High Level Balance Comments did my small stairs step over step 4" and 6" this really fatigued her      Lumbar Exercises: Aerobic   UBE (Upper Arm Bike) level 5 x 5 minutes    Nustep level 5 x 6 minutes      Lumbar Exercises: Machines for Strengthening   Cybex Knee Extension 5# 2x15    Cybex Knee Flexion 25# 2x15      Lumbar Exercises: Seated   Other Seated Lumbar Exercises practiced pursed lipped breathing started out O2 saturation was 74% able to get it up to 83% after 2 minutes                    PT Short Term Goals - 12/04/19 1627      PT SHORT TERM GOAL #1   Title independent with intial HEP    Status Achieved             PT Long Term Goals - 04/01/20 1437      PT LONG TERM GOAL #1   Title increase overall hip LE strength for functional gait and safety hips and ankles to 4+/5    Status Partially Met      PT LONG TERM GOAL #2   Title decrease TUG time to less than 14 seconds for functional gait and safety    Status Partially Met      PT LONG TERM GOAL #3   Title increase BERG to 50/56 for safety and independence    Status Partially Met      PT LONG TERM GOAL #4   Title decrease LBP 50% with activities    Status Achieved      PT LONG TERM GOAL #5   Title increase lumbar ROM 25%    Status Achieved                 Plan - 04/01/20 1434    Clinical Impression Statement Patient has conitnued to have issues with her breathing and heart rate.  She has struggled at times with O2 saturation in the mid 70s, I can get hre to do pursed lipped  breathing and in about 2 minutes she is up in the low 80s.  She also at times will have HR race or slow and at times it is not in correlation with exercise.  She has Berg score of 44/56, her Doristine Section was 19 seconds, both of these put her at a high risk for falls.  She has seen a cardiologist and has been referred to a pulmonologist, she is concerned if she stops PT she will really regress, and I agree with her that this could happen    PT Next Visit Plan may need to see what we need to do for further treatment, as we will have to ask for more visits    Consulted and Agree with Plan of Care Patient           Patient will benefit from skilled therapeutic intervention in order to improve the following deficits and impairments:  Abnormal gait, Decreased range of motion, Difficulty walking, Increased muscle spasms, Decreased activity tolerance, Pain, Decreased balance, Impaired flexibility, Improper body mechanics, Postural dysfunction, Decreased strength, Decreased mobility  Visit Diagnosis: Muscle weakness (generalized)  Risk for falls  Difficulty in walking, not elsewhere classified  Physical debility     Problem List Patient Active Problem List   Diagnosis Date Noted  . Hypoxia 03/23/2020  . Acute cystitis with hematuria 12/19/2019  . Hypomagnesemia 07/11/2017  . Nausea vomiting and diarrhea 07/10/2017  . Hypokalemia 07/10/2017  . Chronic diastolic CHF (congestive heart failure) ([place]) 07/10/2017  . Seizure ([place]) 07/10/2017  . OSA (obstructive sleep apnea) 07/10/2017  . Sepsis ([place]) 07/10/2017  . Seizure disorder ([place]) 07/05/2017  . Pulmonary HTN ([place]) 07/05/2017  . Anxiety state   . Physical debility 06/23/2017  . 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 History of acute respiratory failure   . Tracheostomy, acute management ([place])   . Aspiration pneumonia ([place])   . Encephalopathy   . Status epilepticus ([place])   . Benign essential HTN   . Diabetes mellitus type 2 in nonobese (HCC)    . History of pulmonary embolism   . Acute blood loss anemia   . Diverticulitis of colon   . Tachypnea   . Encounter for orogastric (OG) tube placement   . Encounter for central line placement   . Encounter for attention to tracheostomy ([place])   . Acute respiratory failure with hypoxia ([place])   . Acute encephalopathy 06/06/2017  . Diverticulitis of large intestine without perforation or abscess without bleeding   . Abdominal pain   . Non-intractable cyclical vomiting with nausea   . Abnormal CT of the abdomen   . Hypocalcemia 05/27/2017  . 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 Colitis 05/27/2017  . Central retinal vein occlusion of right eye 04/20/2016  . Nuclear sclerotic cataract of both eyes 04/20/2016  . Diabetic foot infection ([place]) 07/21/2015  . Diabetes mellitus type 2, controlled ([place]) 07/21/2015  . Cellulitis of foot, right 07/21/2015  . Cellulitis of right foot 07/21/2015  . Acute pulmonary embolism ([place]) 04/01/2013  . DVT (deep venous thrombosis) ([place]) 04/01/2013  . HX: breast cancer 04/01/2013  . Concussion 04/01/2013  . Diabetes ([place]) 04/01/2013  . Essential hypertension, benign 04/01/2013  . Hypothyroidism 04/01/2013  . OSA on CPAP 04/01/2013    [name] Boast., PT 04/01/2020, 2:39 PM  [place] [address] [place], [name], [address] Phone: [phone]   Fax:  [phone]  Name: Amanda Short MRN: [id number] Date of Birth: March 08, 1941

## 2020-04-06 ENCOUNTER — Telehealth: Payer: Self-pay | Admitting: Family Medicine

## 2020-04-06 NOTE — Telephone Encounter (Signed)
Izora Gala with [employer] states she faxed  RX on 04/02/20 for portable oxygen. She is calling for status. Her callback number is [phone].

## 2020-04-08 NOTE — Telephone Encounter (Signed)
Amanda Short is calling back to check the status for oxygen tank for patient, please advise [phone].

## 2020-04-08 NOTE — Telephone Encounter (Signed)
Spoke with Amanda Short who verbally understood form received and will be faxed as soon as it is signed.

## 2020-04-11 ENCOUNTER — Encounter: Payer: Self-pay | Admitting: Physical Therapy

## 2020-04-12 ENCOUNTER — Ambulatory Visit: Payer: Medicare PPO | Admitting: Physical Therapy

## 2020-04-12 NOTE — Telephone Encounter (Signed)
Amanda Short called back to check the status of the forms, please advise. CB is [phone]

## 2020-04-13 ENCOUNTER — Encounter: Payer: Self-pay | Admitting: Physical Therapy

## 2020-04-13 NOTE — Telephone Encounter (Signed)
Forms waiting to be signed, will remind doctor today to see if they can be signed.

## 2020-04-14 NOTE — Telephone Encounter (Signed)
Amanda Short with [employer] called again to check status of this RX for portable oxygen. Her call back number is [phone].

## 2020-04-15 ENCOUNTER — Ambulatory Visit: Payer: Medicare PPO | Admitting: Physical Therapy

## 2020-04-15 ENCOUNTER — Encounter: Payer: Self-pay | Admitting: Physical Therapy

## 2020-04-15 ENCOUNTER — Other Ambulatory Visit: Payer: Self-pay

## 2020-04-15 DIAGNOSIS — M6281 Muscle weakness (generalized): Secondary | ICD-10-CM

## 2020-04-15 DIAGNOSIS — Z9181 History of falling: Secondary | ICD-10-CM

## 2020-04-15 DIAGNOSIS — R262 Difficulty in walking, not elsewhere classified: Secondary | ICD-10-CM

## 2020-04-15 DIAGNOSIS — R2689 Other abnormalities of gait and mobility: Secondary | ICD-10-CM

## 2020-04-15 DIAGNOSIS — M545 Low back pain, unspecified: Secondary | ICD-10-CM

## 2020-04-15 DIAGNOSIS — R5381 Other malaise: Secondary | ICD-10-CM

## 2020-04-15 DIAGNOSIS — M62830 Muscle spasm of back: Secondary | ICD-10-CM

## 2020-04-15 NOTE — Therapy (Signed)
[facility] [place], [name], [address] Phone: [phone]   Fax:  [phone] Progress Note Reporting Period 02/17/20 to 04/15/20 for visits 21-30  See note below for Objective Data and Assessment of Progress/Goals.      Physical Therapy Treatment  Patient Details  Name: Amanda Short MRN: [id number] Date of Birth: February 21, 1941 Referring Provider (PT): Rennard   Encounter Date: 04/15/2020   PT End of Session - 04/15/20 0254    Visit Number 30    Number of Visits 41    Date for PT Re-Evaluation 04/29/20    Authorization Type [employer]    PT Start Time 1512    PT Stop Time 1603    PT Time Calculation (min) 51 min    Activity Tolerance Patient tolerated treatment well;Patient limited by fatigue           Past Medical History:  Diagnosis Date  . Abdominal pain   . Abnormal CT of the abdomen   . Acute blood loss anemia   . Acute cystitis with hematuria 12/19/2019  . Acute encephalopathy 06/06/2017  . Acute pulmonary embolism ([place]) 04/01/2013  . Acute respiratory failure with hypoxia ([place])   . Anxiety state   . Arthritis    "back, hands" (07/22/2015)  . Aspiration pneumonia ([place])   . Benign essential HTN   . Cancer of left breast ([place]) 2006   S/P lumpectomy  . Cellulitis of foot, right 07/21/2015  . Cellulitis of right foot 07/21/2015  . Central retinal vein occlusion of right eye 04/20/2016  . Chronic diastolic CHF (congestive heart failure) ([place]) 07/10/2017  . Colitis 05/27/2017  . Complication of anesthesia    "brief breathing problem at surgery center in ~ 2005 when I had gallbladder OR"  . Concussion 03/2013   due to fall  . Depression   . Diabetes ([place]) 04/01/2013  . 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 Diabetes mellitus type 2, controlled ([place]) 07/21/2015  . Diabetic foot infection ([place]) 07/21/2015  . Diverticulitis of colon   . Diverticulitis of large intestine without perforation or abscess  without bleeding   . Diverticulosis   . DVT (deep venous thrombosis) ([place]) 03/2013   LLE  . Encephalopathy   . Encounter for attention to tracheostomy ([place])   . Encounter for central line placement   . Encounter for orogastric (OG) tube placement   . Essential hypertension, benign 04/01/2013  . GERD (gastroesophageal reflux disease)   . History of acute respiratory failure   . History of pulmonary embolism   . HX: breast cancer 04/01/2013  . Hyperlipidemia   . Hypertension   . Hypocalcemia 05/27/2017  . Hypokalemia 07/10/2017  . Hypomagnesemia 07/11/2017  . Hypothyroidism   . Nausea vomiting and diarrhea 07/10/2017  . Non-intractable cyclical vomiting with nausea   . Nuclear sclerotic cataract of both eyes 04/20/2016  . OSA (obstructive sleep apnea) 07/10/2017  . OSA on CPAP 04/01/2013  . OSA treated with BiPAP   . Physical debility 06/23/2017  . 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 Pulmonary embolism ([place]) 03/2013  . Pulmonary HTN ([place]) 07/05/2017  . Seizure ([place]) 07/10/2017  . Seizure disorder ([place]) 07/05/2017  . Seizures ([place])   . Sepsis ([place]) 07/10/2017  . Sleep apnea   . Status epilepticus ([place])   . 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 Tachypnea   . Thyroid disease    Hypothyroid  . Tracheostomy, acute management ([place])   . Type  II diabetes mellitus ([place])     Past Surgical History:  Procedure Laterality Date  . ABDOMINAL HYSTERECTOMY  1970s  . BREAST BIOPSY Left 2006  . BREAST LUMPECTOMY Left 2006  . BUNIONECTOMY WITH HAMMERTOE RECONSTRUCTION Left   . ESOPHAGOGASTRODUODENOSCOPY N/A 05/29/2017   Procedure: ESOPHAGOGASTRODUODENOSCOPY (EGD);  Surgeon: Irene Shipper, MD;  Location: [facility];  Service: Endoscopy;  Laterality: N/A;  . JOINT REPLACEMENT    . LAPAROSCOPIC CHOLECYSTECTOMY  ~ 2005  . TOTAL HIP ARTHROPLASTY Left 2010  . TOTAL KNEE ARTHROPLASTY Bilateral 2005-2006    There were no vitals filed for this visit.   Subjective Assessment - 04/15/20 1518     Subjective Patient reports that she did some housework and her low back is really hurting    Currently in Pain? Yes    Pain Score 6     Pain Location Back    Pain Orientation Lower    Aggravating Factors  housework                             [facility] Adult PT Treatment/Exercise - 04/15/20 0001      Lumbar Exercises: Aerobic   UBE (Upper Arm Bike) level 3 x 5 minutes    Nustep level 5 x 6 minutes      Lumbar Exercises: Standing   Other Standing Lumbar Exercises 2.5# marching and hip abduction with walker 2x10 each, some pain in the back with this.      Lumbar Exercises: Seated   Other Seated Lumbar Exercises isometric abs    Other Seated Lumbar Exercises red tband scapular stabilization, ball adduction squeeze.  Red tband knee flexion      Moist Heat Therapy   Number Minutes Moist Heat 13 Minutes    Moist Heat Location Lumbar Spine      Electrical Stimulation   Electrical Stimulation Location lumbar area    Electrical Stimulation Action IFC    Electrical Stimulation Parameters supine    Electrical Stimulation Goals Pain                    PT Short Term Goals - 12/04/19 1627      PT SHORT TERM GOAL #1   Title independent with intial HEP    Status Achieved             PT Long Term Goals - 04/15/20 1551      PT LONG TERM GOAL #1   Title increase overall hip LE strength for functional gait and safety hips and ankles to 4+/5    Status Partially Met      PT LONG TERM GOAL #2   Title decrease TUG time to less than 14 seconds for functional gait and safety    Status Partially Met      PT LONG TERM GOAL #3   Title increase BERG to 50/56 for safety and independence    Status Partially Met      PT LONG TERM GOAL #4   Title decrease LBP 50% with activities    Status Partially Met                 Plan - 04/15/20 1549    Clinical Impression Statement Patient with increased LBP after doing some housework.  She has pain rated a  6/10.  She reports that she did some cleaning.  She did not tolerate exercises today due to them increasing LBP.  Her O2 was 80-87% HR was 45-99 bpm    PT Next Visit Plan it will be a month prior to her being able to see the pulmonologist    Consulted and Agree with Plan of Care Patient           Patient will benefit from skilled therapeutic intervention in order to improve the following deficits and impairments:  Abnormal gait, Decreased range of motion, Difficulty walking, Increased muscle spasms, Decreased activity tolerance, Pain, Decreased balance, Impaired flexibility, Improper body mechanics, Postural dysfunction, Decreased strength, Decreased mobility  Visit Diagnosis: Muscle weakness (generalized)  Risk for falls  Difficulty in walking, not elsewhere classified  Physical debility  Acute bilateral low back pain without sciatica  Other abnormalities of gait and mobility  Muscle spasm of back     Problem List Patient Active Problem List   Diagnosis Date Noted  . Hypoxia 03/23/2020  . Acute cystitis with hematuria 12/19/2019  . Hypomagnesemia 07/11/2017  . Nausea vomiting and diarrhea 07/10/2017  . Hypokalemia 07/10/2017  . Chronic diastolic CHF (congestive heart failure) ([place]) 07/10/2017  . Seizure ([place]) 07/10/2017  . OSA (obstructive sleep apnea) 07/10/2017  . Sepsis ([place]) 07/10/2017  . Seizure disorder ([place]) 07/05/2017  . Pulmonary HTN ([place]) 07/05/2017  . Anxiety state   . Physical debility 06/23/2017  . 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 History of acute respiratory failure   . Tracheostomy, acute management ([place])   . Aspiration pneumonia ([place])   . Encephalopathy   . Status epilepticus ([place])   . Benign essential HTN   . 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 History of pulmonary embolism   . Acute blood loss anemia   . Diverticulitis of colon   . Tachypnea   . Encounter for orogastric (OG) tube placement   . Encounter for central line  placement   . Encounter for attention to tracheostomy ([place])   . Acute respiratory failure with hypoxia ([place])   . Acute encephalopathy 06/06/2017  . Diverticulitis of large intestine without perforation or abscess without bleeding   . Abdominal pain   . Non-intractable cyclical vomiting with nausea   . Abnormal CT of the abdomen   . Hypocalcemia 05/27/2017  . 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 Colitis 05/27/2017  . Central retinal vein occlusion of right eye 04/20/2016  . Nuclear sclerotic cataract of both eyes 04/20/2016  . Diabetic foot infection ([place]) 07/21/2015  . Diabetes mellitus type 2, controlled ([place]) 07/21/2015  . Cellulitis of foot, right 07/21/2015  . Cellulitis of right foot 07/21/2015  . Acute pulmonary embolism ([place]) 04/01/2013  . DVT (deep venous thrombosis) ([place]) 04/01/2013  . HX: breast cancer 04/01/2013  . Concussion 04/01/2013  . Diabetes ([place]) 04/01/2013  . Essential hypertension, benign 04/01/2013  . Hypothyroidism 04/01/2013  . OSA on CPAP 04/01/2013    [name] Boast., PT 04/15/2020, 3:52 PM  [address] [place], [name], [address] Phone: [phone]   Fax:  [phone]  Name: Amanda Short MRN: [id number] Date of Birth: 1940-10-03

## 2020-04-15 NOTE — Telephone Encounter (Signed)
Returned Nancy's call to inform that form was faxed today. No answer LM informing

## 2020-04-17 ENCOUNTER — Encounter: Payer: Self-pay | Admitting: Physical Therapy

## 2020-04-20 ENCOUNTER — Encounter: Payer: Self-pay | Admitting: Physical Therapy

## 2020-04-20 ENCOUNTER — Ambulatory Visit: Payer: Medicare PPO | Attending: Family Medicine | Admitting: Physical Therapy

## 2020-04-20 ENCOUNTER — Other Ambulatory Visit: Payer: Self-pay

## 2020-04-20 DIAGNOSIS — R2689 Other abnormalities of gait and mobility: Secondary | ICD-10-CM | POA: Diagnosis present

## 2020-04-20 DIAGNOSIS — M62830 Muscle spasm of back: Secondary | ICD-10-CM | POA: Diagnosis present

## 2020-04-20 DIAGNOSIS — M545 Low back pain, unspecified: Secondary | ICD-10-CM | POA: Diagnosis present

## 2020-04-20 DIAGNOSIS — Z9181 History of falling: Secondary | ICD-10-CM | POA: Insufficient documentation

## 2020-04-20 DIAGNOSIS — M6281 Muscle weakness (generalized): Secondary | ICD-10-CM | POA: Diagnosis not present

## 2020-04-20 DIAGNOSIS — R5381 Other malaise: Secondary | ICD-10-CM | POA: Insufficient documentation

## 2020-04-20 DIAGNOSIS — R262 Difficulty in walking, not elsewhere classified: Secondary | ICD-10-CM

## 2020-04-20 NOTE — Therapy (Signed)
[facility]. [place], [name], [address] Phone: [phone]   Fax:  [phone]  Physical Therapy Treatment  Patient Details  Name: Amanda Short MRN: [id number] Date of Birth: 1940-08-10 Referring Provider (PT): Rennard   Encounter Date: 04/20/2020   PT End of Session - 04/20/20 0034    Visit Number 31    Number of Visits 41    Date for PT Re-Evaluation 04/29/20    Authorization Type [employer]    PT Start Time 1300    PT Stop Time 9179    PT Time Calculation (min) 45 min    Activity Tolerance Patient tolerated treatment well;Patient limited by fatigue    Behavior During Therapy [facility] for tasks assessed/performed           Past Medical History:  Diagnosis Date   Abdominal pain    Abnormal CT of the abdomen    Acute blood loss anemia    Acute cystitis with hematuria 12/19/2019   Acute encephalopathy 06/06/2017   Acute pulmonary embolism ([place]) 04/01/2013   Acute respiratory failure with hypoxia ([place])    Anxiety state    Arthritis    "back, hands" (07/22/2015)   Aspiration pneumonia ([place])    Benign essential HTN    Cancer of left breast ([place]) 2006   S/P lumpectomy   Cellulitis of foot, right 07/21/2015   Cellulitis of right foot 07/21/2015   Central retinal vein occlusion of right eye 04/20/2016   Chronic diastolic CHF (congestive heart failure) ([place]) 07/10/2017   Colitis 15/11/6977   Complication of anesthesia    "brief breathing problem at surgery center in ~ 2005 when I had gallbladder OR"   Concussion 03/2013   due to fall   Depression    Diabetes ([place]) 04/01/2013   Diabetes mellitus type 2 in nonobese ([place])    Diabetes mellitus type 2, controlled ([place]) 07/21/2015   Diabetic foot infection ([place]) 07/21/2015   Diverticulitis of colon    Diverticulitis of large intestine without perforation or abscess without bleeding    Diverticulosis    DVT (deep venous thrombosis) ([place]) 03/2013    LLE   Encephalopathy    Encounter for attention to tracheostomy [facility])    Encounter for central line placement    Encounter for orogastric (OG) tube placement    Essential hypertension, benign 04/01/2013   GERD (gastroesophageal reflux disease)    History of acute respiratory failure    History of pulmonary embolism    HX: breast cancer 04/01/2013   Hyperlipidemia    Hypertension    Hypocalcemia 05/27/2017   Hypokalemia 07/10/2017   Hypomagnesemia 07/11/2017   Hypothyroidism    Nausea vomiting and diarrhea 07/10/2017   Non-intractable cyclical vomiting with nausea    Nuclear sclerotic cataract of both eyes 04/20/2016   OSA (obstructive sleep apnea) 07/10/2017   OSA on CPAP 04/01/2013   OSA treated with BiPAP    Physical debility 06/23/2017   Prolonged QT interval 05/27/2017   Pulmonary embolism ([place]) 03/2013   Pulmonary HTN ([place]) 07/05/2017   Seizure ([place]) 07/10/2017   Seizure disorder ([place]) 07/05/2017   Seizures ([place])    Sepsis ([place]) 07/10/2017   Sleep apnea    Status epilepticus ([place])    Status post trachelectomy 06/23/2017   Tachypnea    Thyroid disease    Hypothyroid   Tracheostomy, acute management ([place])    Type II diabetes mellitus ([place])     Past Surgical History:  Procedure Laterality Date  ABDOMINAL HYSTERECTOMY  1970s   BREAST BIOPSY Left 2006   BREAST LUMPECTOMY Left 2006   BUNIONECTOMY WITH HAMMERTOE RECONSTRUCTION Left    ESOPHAGOGASTRODUODENOSCOPY N/A 05/29/2017   Procedure: ESOPHAGOGASTRODUODENOSCOPY (EGD);  Surgeon: Irene Shipper, MD;  Location: [facility];  Service: Endoscopy;  Laterality: N/A;   JOINT REPLACEMENT     LAPAROSCOPIC CHOLECYSTECTOMY  ~ 2005   TOTAL HIP ARTHROPLASTY Left 2010   TOTAL KNEE ARTHROPLASTY Bilateral 2005-2006    There were no vitals filed for this visit.   Subjective Assessment - 04/20/20 1302    Subjective Reports that her back got better after the last visit, reports that she is  needing O2 in the afternoons due to shortness of breath, prior to starting exercises O2 was 85%    Currently in Pain? Yes    Pain Score 2     Pain Location Back                             [facility] Adult PT Treatment/Exercise - 04/20/20 0001      Ambulation/Gait   Gait Comments gait with HHA outside in the grass, to her car, she had to stop and rest twice due to fatigue "in the legs"      High Level Balance   High Level Balance Comments on airex red tband scapular stabilization, on airex ball toss, on foam balance beam tandem walk with assists      Lumbar Exercises: Aerobic   UBE (Upper Arm Bike) level 4 x 4 minutes    Nustep level 5 x 6 minutes      Lumbar Exercises: Machines for Strengthening   Cybex Knee Extension 10# 2x15    Cybex Knee Flexion 25# 2x15      Lumbar Exercises: Standing   Other Standing Lumbar Exercises 3# marching and hip abduction with walker 2x10 each, some pain in the back with this.                    PT Short Term Goals - 12/04/19 1627      PT SHORT TERM GOAL #1   Title independent with intial HEP    Status Achieved             PT Long Term Goals - 04/15/20 1551      PT LONG TERM GOAL #1   Title increase overall hip LE strength for functional gait and safety hips and ankles to 4+/5    Status Partially Met      PT LONG TERM GOAL #2   Title decrease TUG time to less than 14 seconds for functional gait and safety    Status Partially Met      PT LONG TERM GOAL #3   Title increase BERG to 50/56 for safety and independence    Status Partially Met      PT LONG TERM GOAL #4   Title decrease LBP 50% with activities    Status Partially Met                 Plan - 04/20/20 1343    Clinical Impression Statement O@ saturation was consitently in the mid 85% range, she did have issues with trying to walk > 100 feet due to fatigue.  She reports that over the past week she has had to use oxygen in the afternoon due to  some shortness of breath, reports that if she goes shopping she uses an  electric cart.    PT Next Visit Plan assess walking    Consulted and Agree with Plan of Care Patient           Patient will benefit from skilled therapeutic intervention in order to improve the following deficits and impairments:  Abnormal gait, Decreased range of motion, Difficulty walking, Increased muscle spasms, Decreased activity tolerance, Pain, Decreased balance, Impaired flexibility, Improper body mechanics, Postural dysfunction, Decreased strength, Decreased mobility  Visit Diagnosis: Muscle weakness (generalized)  Risk for falls  Difficulty in walking, not elsewhere classified  Physical debility  Acute bilateral low back pain without sciatica  Other abnormalities of gait and mobility  Muscle spasm of back     Problem List Patient Active Problem List   Diagnosis Date Noted   Hypoxia 03/23/2020   Acute cystitis with hematuria 12/19/2019   Hypomagnesemia 07/11/2017   Nausea vomiting and diarrhea 07/10/2017   Hypokalemia 07/10/2017   Chronic diastolic CHF (congestive heart failure) ([place]) 07/10/2017   Seizure ([place]) 07/10/2017   OSA (obstructive sleep apnea) 07/10/2017   Sepsis ([place]) 07/10/2017   Seizure disorder ([place]) 07/05/2017   Pulmonary HTN ([place]) 07/05/2017   Anxiety state    Physical debility 06/23/2017   Status post trachelectomy 06/23/2017   History of acute respiratory failure    Tracheostomy, acute management ([place])    Aspiration pneumonia ([place])    Encephalopathy    Status epilepticus ([place])    Benign essential HTN    Diabetes mellitus type 2 in nonobese ([place])    History of pulmonary embolism    Acute blood loss anemia    Diverticulitis of colon    Tachypnea    Encounter for orogastric (OG) tube placement    Encounter for central line placement    Encounter for attention to tracheostomy ([place])    Acute respiratory failure with hypoxia ([place])     Acute encephalopathy 06/06/2017   Diverticulitis of large intestine without perforation or abscess without bleeding    Abdominal pain    Non-intractable cyclical vomiting with nausea    Abnormal CT of the abdomen    Hypocalcemia 05/27/2017   Prolonged QT interval 05/27/2017   Colitis 05/27/2017   Central retinal vein occlusion of right eye 04/20/2016   Nuclear sclerotic cataract of both eyes 04/20/2016   Diabetic foot infection ([place]) 07/21/2015   Diabetes mellitus type 2, controlled ([place]) 07/21/2015   Cellulitis of foot, right 07/21/2015   Cellulitis of right foot 07/21/2015   Acute pulmonary embolism ([place]) 04/01/2013   DVT (deep venous thrombosis) ([place]) 04/01/2013   HX: breast cancer 04/01/2013   Concussion 04/01/2013   Diabetes ([place]) 04/01/2013   Essential hypertension, benign 04/01/2013   Hypothyroidism 04/01/2013   OSA on CPAP 04/01/2013    [name] Boast., PT 04/20/2020, 1:45 PM  [address] [place], [name], [address] Phone: [phone]   Fax:  [phone]  Name: Amanda Short MRN: [id number] Date of Birth: 05/04/1941

## 2020-04-22 ENCOUNTER — Ambulatory Visit: Payer: Medicare PPO | Admitting: Physical Therapy

## 2020-04-22 ENCOUNTER — Encounter: Payer: Self-pay | Admitting: Physical Therapy

## 2020-04-22 ENCOUNTER — Other Ambulatory Visit: Payer: Self-pay

## 2020-04-22 ENCOUNTER — Other Ambulatory Visit: Payer: Self-pay | Admitting: Family Medicine

## 2020-04-22 DIAGNOSIS — M6281 Muscle weakness (generalized): Secondary | ICD-10-CM

## 2020-04-22 DIAGNOSIS — Z9181 History of falling: Secondary | ICD-10-CM

## 2020-04-22 DIAGNOSIS — G4700 Insomnia, unspecified: Secondary | ICD-10-CM

## 2020-04-22 NOTE — Therapy (Signed)
[address] [place], [name], [address] Phone: [phone]   Fax:  [phone]  Physical Therapy Treatment  Patient Details  Name: Amanda Short MRN: [id number] Date of Birth: [birth year] Referring Provider (PT): Rennard   Encounter Date: 04/22/2020   PT End of Session - 04/22/20 1528    Visit Number 32    Number of Visits 41    Date for PT Re-Evaluation 04/29/20    Authorization Type [employer]    PT Start Time 1304    PT Stop Time 1348    PT Time Calculation (min) 44 min    Activity Tolerance Patient tolerated treatment well;Patient limited by fatigue    Behavior During Therapy [facility] for tasks assessed/performed           Past Medical History:  Diagnosis Date  . Abdominal pain   . Abnormal CT of the abdomen   . Acute blood loss anemia   . Acute cystitis with hematuria 12/19/2019  . Acute encephalopathy 06/06/2017  . Acute pulmonary embolism ([place]) 04/01/2013  . Acute respiratory failure with hypoxia ([place])   . Anxiety state   . Arthritis    "back, hands" (07/22/2015)  . Aspiration pneumonia ([place])   . Benign essential HTN   . Cancer of left breast ([place]) 2006   S/P lumpectomy  . Cellulitis of foot, right 07/21/2015  . Cellulitis of right foot 07/21/2015  . Central retinal vein occlusion of right eye 04/20/2016  . Chronic diastolic CHF (congestive heart failure) ([place]) 07/10/2017  . Colitis 05/27/2017  . Complication of anesthesia    "brief breathing problem at surgery center in ~ 2005 when I had gallbladder OR"  . Concussion 03/2013   due to fall  . Depression   . Diabetes ([place]) 04/01/2013  . 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 Diabetes mellitus type 2, controlled ([place]) 07/21/2015  . Diabetic foot infection ([place]) 07/21/2015  . Diverticulitis of colon   . Diverticulitis of large intestine without perforation or abscess without bleeding   . Diverticulosis   . DVT (deep venous thrombosis) ([place]) 03/2013    LLE  . Encephalopathy   . Encounter for attention to tracheostomy ([place])   . Encounter for central line placement   . Encounter for orogastric (OG) tube placement   . Essential hypertension, benign 04/01/2013  . GERD (gastroesophageal reflux disease)   . History of acute respiratory failure   . History of pulmonary embolism   . HX: breast cancer 04/01/2013  . Hyperlipidemia   . Hypertension   . Hypocalcemia 05/27/2017  . Hypokalemia 07/10/2017  . Hypomagnesemia 07/11/2017  . Hypothyroidism   . Nausea vomiting and diarrhea 07/10/2017  . Non-intractable cyclical vomiting with nausea   . Nuclear sclerotic cataract of both eyes 04/20/2016  . OSA (obstructive sleep apnea) 07/10/2017  . OSA on CPAP 04/01/2013  . OSA treated with BiPAP   . Physical debility 06/23/2017  . 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 Pulmonary embolism ([place]) 03/2013  . Pulmonary HTN ([place]) 07/05/2017  . Seizure ([place]) 07/10/2017  . Seizure disorder ([place]) 07/05/2017  . Seizures ([place])   . Sepsis ([place]) 07/10/2017  . Sleep apnea   . Status epilepticus ([place])   . 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 Tachypnea   . Thyroid disease    Hypothyroid  . Tracheostomy, acute management ([place])   . Type II diabetes mellitus ([place])     Past Surgical History:  Procedure Laterality Date  .  ABDOMINAL HYSTERECTOMY  1970s  . BREAST BIOPSY Left 2006  . BREAST LUMPECTOMY Left 2006  . BUNIONECTOMY WITH HAMMERTOE RECONSTRUCTION Left   . ESOPHAGOGASTRODUODENOSCOPY N/A 05/29/2017   Procedure: ESOPHAGOGASTRODUODENOSCOPY (EGD);  Surgeon: Irene Shipper, MD;  Location: [facility];  Service: Endoscopy;  Laterality: N/A;  . JOINT REPLACEMENT    . LAPAROSCOPIC CHOLECYSTECTOMY  ~ 2005  . TOTAL HIP ARTHROPLASTY Left 2010  . TOTAL KNEE ARTHROPLASTY Bilateral 2005-2006    There were no vitals filed for this visit.   Subjective Assessment - 04/22/20 1304    Subjective Patient reports that the air is heavy, "a lifttle more diffculty  breathing"  Starting treatment O2 was 86%    Currently in Pain? No/denies                             [facility] Adult PT Treatment/Exercise - 04/22/20 0001      Ambulation/Gait   Gait Comments gait with light HHA x 100 feet without rest      High Level Balance   High Level Balance Comments on airex red tband scapular stabilization, on airex ball toss, on foam balance beam tandem walk with assists, on airex reaching      Lumbar Exercises: Aerobic   Recumbent Bike 5 minutes with about 35 rest breaks    Nustep level 5 x 5 minutes      Lumbar Exercises: Machines for Strengthening   Cybex Knee Extension 10# 2x15    Leg Press 20# 3x10    Other Lumbar Machine Exercise 20# seated rows and lats                    PT Short Term Goals - 12/04/19 1627      PT SHORT TERM GOAL #1   Title independent with intial HEP    Status Achieved             PT Long Term Goals - 04/15/20 1551      PT LONG TERM GOAL #1   Title increase overall hip LE strength for functional gait and safety hips and ankles to 4+/5    Status Partially Met      PT LONG TERM GOAL #2   Title decrease TUG time to less than 14 seconds for functional gait and safety    Status Partially Met      PT LONG TERM GOAL #3   Title increase BERG to 50/56 for safety and independence    Status Partially Met      PT LONG TERM GOAL #4   Title decrease LBP 50% with activities    Status Partially Met                 Plan - 04/22/20 1529    Clinical Impression Statement Patietn seemed to have less difficulty with O2 from 79-85%.  HR from 80-98 bpm.  She does require rest and has some shortness of breath, she does seemd to do well with O2 saturation with aerobic activities, with things she is concentrating on like balance the O2 drops and she seems to hold her breath.    PT Next Visit Plan may need renewal next week    Consulted and Agree with Plan of Care Patient           Patient will  benefit from skilled therapeutic intervention in order to improve the following deficits and impairments:  Abnormal gait, Decreased range of  motion, Difficulty walking, Increased muscle spasms, Decreased activity tolerance, Pain, Decreased balance, Impaired flexibility, Improper body mechanics, Postural dysfunction, Decreased strength, Decreased mobility  Visit Diagnosis: Muscle weakness (generalized)  Risk for falls     Problem List Patient Active Problem List   Diagnosis Date Noted  . Hypoxia 03/23/2020  . Acute cystitis with hematuria 12/19/2019  . Hypomagnesemia 07/11/2017  . Nausea vomiting and diarrhea 07/10/2017  . Hypokalemia 07/10/2017  . Chronic diastolic CHF (congestive heart failure) ([place]) 07/10/2017  . Seizure ([place]) 07/10/2017  . OSA (obstructive sleep apnea) 07/10/2017  . Sepsis ([place]) 07/10/2017  . Seizure disorder ([place]) 07/05/2017  . Pulmonary HTN ([place]) 07/05/2017  . Anxiety state   . Physical debility 06/23/2017  . 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 History of acute respiratory failure   . Tracheostomy, acute management ([place])   . Aspiration pneumonia ([place])   . Encephalopathy   . Status epilepticus ([place])   . Benign essential HTN   . 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 History of pulmonary embolism   . Acute blood loss anemia   . Diverticulitis of colon   . Tachypnea   . Encounter for orogastric (OG) tube placement   . Encounter for central line placement   . Encounter for attention to tracheostomy ([place])   . Acute respiratory failure with hypoxia ([place])   . Acute encephalopathy 06/06/2017  . Diverticulitis of large intestine without perforation or abscess without bleeding   . Abdominal pain   . Non-intractable cyclical vomiting with nausea   . Abnormal CT of the abdomen   . Hypocalcemia 05/27/2017  . 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 Colitis 05/27/2017  . Central retinal vein occlusion of right eye 04/20/2016  . Nuclear sclerotic  cataract of both eyes 04/20/2016  . Diabetic foot infection ([place]) 07/21/2015  . Diabetes mellitus type 2, controlled ([place]) 07/21/2015  . Cellulitis of foot, right 07/21/2015  . Cellulitis of right foot 07/21/2015  . Acute pulmonary embolism ([place]) 04/01/2013  . DVT (deep venous thrombosis) ([place]) 04/01/2013  . HX: breast cancer 04/01/2013  . Concussion 04/01/2013  . Diabetes ([place]) 04/01/2013  . Essential hypertension, benign 04/01/2013  . Hypothyroidism 04/01/2013  . OSA on CPAP 04/01/2013    [name] Boast., PT 04/22/2020, 4:53 PM  [address]. [place], [name], [address] Phone: [phone]   Fax:  [phone]  Name: Afra Tricarico MRN: [id number] Date of Birth: September 13, 1940

## 2020-04-27 ENCOUNTER — Ambulatory Visit: Payer: Medicare PPO | Admitting: Physical Therapy

## 2020-04-27 ENCOUNTER — Encounter: Payer: Self-pay | Admitting: Physical Therapy

## 2020-04-27 ENCOUNTER — Other Ambulatory Visit: Payer: Self-pay

## 2020-04-27 DIAGNOSIS — M6281 Muscle weakness (generalized): Secondary | ICD-10-CM | POA: Diagnosis not present

## 2020-04-27 DIAGNOSIS — Z9181 History of falling: Secondary | ICD-10-CM

## 2020-04-27 DIAGNOSIS — R5381 Other malaise: Secondary | ICD-10-CM

## 2020-04-27 DIAGNOSIS — M545 Low back pain, unspecified: Secondary | ICD-10-CM

## 2020-04-27 DIAGNOSIS — R262 Difficulty in walking, not elsewhere classified: Secondary | ICD-10-CM

## 2020-04-27 DIAGNOSIS — R2689 Other abnormalities of gait and mobility: Secondary | ICD-10-CM

## 2020-04-27 DIAGNOSIS — M62830 Muscle spasm of back: Secondary | ICD-10-CM

## 2020-04-27 NOTE — Therapy (Signed)
[address]. [place], [name], [address] Phone: [phone]   Fax:  [phone]  Physical Therapy Treatment  Patient Details  Name: Amanda Short MRN: [id number] Date of Birth: 05-May-1941 Referring Provider (PT): Rennard   Encounter Date: 04/27/2020   PT End of Session - 04/27/20 1436    Visit Number 33    Number of Visits 41    Date for PT Re-Evaluation 04/29/20    Authorization Type [employer]    PT Start Time 1350    PT Stop Time 1436    PT Time Calculation (min) 46 min    Activity Tolerance Patient tolerated treatment well;Patient limited by fatigue    Behavior During Therapy [facility] for tasks assessed/performed           Past Medical History:  Diagnosis Date  . Abdominal pain   . Abnormal CT of the abdomen   . Acute blood loss anemia   . Acute cystitis with hematuria 12/19/2019  . Acute encephalopathy 06/06/2017  . Acute pulmonary embolism ([place]) 04/01/2013  . Acute respiratory failure with hypoxia ([place])   . Anxiety state   . Arthritis    "back, hands" (07/22/2015)  . Aspiration pneumonia ([place])   . Benign essential HTN   . Cancer of left breast ([place]) 2006   S/P lumpectomy  . Cellulitis of foot, right 07/21/2015  . Cellulitis of right foot 07/21/2015  . Central retinal vein occlusion of right eye 04/20/2016  . Chronic diastolic CHF (congestive heart failure) ([place]) 07/10/2017  . Colitis 05/27/2017  . Complication of anesthesia    "brief breathing problem at surgery center in ~ 2005 when I had gallbladder OR"  . Concussion 03/2013   due to fall  . Depression   . Diabetes ([place]) 04/01/2013  . 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 Diabetes mellitus type 2, controlled ([place]) 07/21/2015  . Diabetic foot infection ([place]) 07/21/2015  . Diverticulitis of colon   . Diverticulitis of large intestine without perforation or abscess without bleeding   . Diverticulosis   . DVT (deep venous thrombosis) ([place]) 03/2013    LLE  . Encephalopathy   . Encounter for attention to tracheostomy ([place])   . Encounter for central line placement   . Encounter for orogastric (OG) tube placement   . Essential hypertension, benign 04/01/2013  . GERD (gastroesophageal reflux disease)   . History of acute respiratory failure   . History of pulmonary embolism   . HX: breast cancer 04/01/2013  . Hyperlipidemia   . Hypertension   . Hypocalcemia 05/27/2017  . Hypokalemia 07/10/2017  . Hypomagnesemia 07/11/2017  . Hypothyroidism   . Nausea vomiting and diarrhea 07/10/2017  . Non-intractable cyclical vomiting with nausea   . Nuclear sclerotic cataract of both eyes 04/20/2016  . OSA (obstructive sleep apnea) 07/10/2017  . OSA on CPAP 04/01/2013  . OSA treated with BiPAP   . Physical debility 06/23/2017  . 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 Pulmonary embolism ([place]) 03/2013  . Pulmonary HTN ([place]) 07/05/2017  . Seizure ([place]) 07/10/2017  . Seizure disorder ([place]) 07/05/2017  . Seizures ([place])   . Sepsis ([place]) 07/10/2017  . Sleep apnea   . Status epilepticus ([place])   . 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 Tachypnea   . Thyroid disease    Hypothyroid  . Tracheostomy, acute management ([place])   . Type II diabetes mellitus ([place])     Past Surgical History:  Procedure Laterality Date  .  ABDOMINAL HYSTERECTOMY  1970s  . BREAST BIOPSY Left 2006  . BREAST LUMPECTOMY Left 2006  . BUNIONECTOMY WITH HAMMERTOE RECONSTRUCTION Left   . ESOPHAGOGASTRODUODENOSCOPY N/A 05/29/2017   Procedure: ESOPHAGOGASTRODUODENOSCOPY (EGD);  Surgeon: Irene Shipper, MD;  Location: [facility];  Service: Endoscopy;  Laterality: N/A;  . JOINT REPLACEMENT    . LAPAROSCOPIC CHOLECYSTECTOMY  ~ 2005  . TOTAL HIP ARTHROPLASTY Left 2010  . TOTAL KNEE ARTHROPLASTY Bilateral 2005-2006    There were no vitals filed for this visit.   Subjective Assessment - 04/27/20 1359    Subjective Patient recieved O2 machine that is portable for her last week, husband  reports that she has not used it much, she reports that yesterday her O2 dropped to 75% and HR went up to 100.  She reports that she was not using her oxygen    Currently in Pain? No/denies                             [facility] Adult PT Treatment/Exercise - 04/27/20 0001      Ambulation/Gait   Gait Comments gait with HHA and with oxygen she did need multiple rests due to fatigue, this was 180 feet      High Level Balance   High Level Balance Activities Side stepping;Backward walking;Direction changes;Negotiating over obstacles;Negotitating around obstacles    High Level Balance Comments on airex green tband scapular stabilization, on airex head turns and reaching, 6" toe touches      Lumbar Exercises: Aerobic   Nustep level 5 x 5 minutes      Lumbar Exercises: Machines for Strengthening   Cybex Knee Extension 10# 2x15    Cybex Knee Flexion 25# 2x15      Lumbar Exercises: Standing   Other Standing Lumbar Exercises 3# marching and hip abduction with walker 2x10 each                    PT Short Term Goals - 12/04/19 1627      PT SHORT TERM GOAL #1   Title independent with intial HEP    Status Achieved             PT Long Term Goals - 04/15/20 1551      PT LONG TERM GOAL #1   Title increase overall hip LE strength for functional gait and safety hips and ankles to 4+/5    Status Partially Met      PT LONG TERM GOAL #2   Title decrease TUG time to less than 14 seconds for functional gait and safety    Status Partially Met      PT LONG TERM GOAL #3   Title increase BERG to 50/56 for safety and independence    Status Partially Met      PT LONG TERM GOAL #4   Title decrease LBP 50% with activities    Status Partially Met                 Plan - 04/27/20 1436    Clinical Impression Statement Patient is now using Oxygen 3L portable unit, during all the exercises and walking today the O2 was b/n 89-93% which is greatly improved, she still  really has a lot of issues with the fatigue, requiring multiple rests to walk 180 feet    PT Next Visit Plan write renewal, ask for more visits    Consulted and Agree with Plan of Care Patient  Patient will benefit from skilled therapeutic intervention in order to improve the following deficits and impairments:  Abnormal gait, Decreased range of motion, Difficulty walking, Increased muscle spasms, Decreased activity tolerance, Pain, Decreased balance, Impaired flexibility, Improper body mechanics, Postural dysfunction, Decreased strength, Decreased mobility  Visit Diagnosis: Muscle weakness (generalized)  Risk for falls  Difficulty in walking, not elsewhere classified  Physical debility  Acute bilateral low back pain without sciatica  Other abnormalities of gait and mobility  Muscle spasm of back     Problem List Patient Active Problem List   Diagnosis Date Noted  . Hypoxia 03/23/2020  . Acute cystitis with hematuria 12/19/2019  . Hypomagnesemia 07/11/2017  . Nausea vomiting and diarrhea 07/10/2017  . Hypokalemia 07/10/2017  . Chronic diastolic CHF (congestive heart failure) ([place]) 07/10/2017  . Seizure ([place]) 07/10/2017  . OSA (obstructive sleep apnea) 07/10/2017  . Sepsis ([place]) 07/10/2017  . Seizure disorder ([place]) 07/05/2017  . Pulmonary HTN ([place]) 07/05/2017  . Anxiety state   . Physical debility 06/23/2017  . 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 History of acute respiratory failure   . Tracheostomy, acute management ([place])   . Aspiration pneumonia ([place])   . Encephalopathy   . Status epilepticus ([place])   . Benign essential HTN   . 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 History of pulmonary embolism   . Acute blood loss anemia   . Diverticulitis of colon   . Tachypnea   . Encounter for orogastric (OG) tube placement   . Encounter for central line placement   . Encounter for attention to tracheostomy ([place])   . Acute respiratory failure with  hypoxia ([place])   . Acute encephalopathy 06/06/2017  . Diverticulitis of large intestine without perforation or abscess without bleeding   . Abdominal pain   . Non-intractable cyclical vomiting with nausea   . Abnormal CT of the abdomen   . Hypocalcemia 05/27/2017  . 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 Colitis 05/27/2017  . Central retinal vein occlusion of right eye 04/20/2016  . Nuclear sclerotic cataract of both eyes 04/20/2016  . Diabetic foot infection ([place]) 07/21/2015  . Diabetes mellitus type 2, controlled ([place]) 07/21/2015  . Cellulitis of foot, right 07/21/2015  . Cellulitis of right foot 07/21/2015  . Acute pulmonary embolism ([place]) 04/01/2013  . DVT (deep venous thrombosis) ([place]) 04/01/2013  . HX: breast cancer 04/01/2013  . Concussion 04/01/2013  . Diabetes ([place]) 04/01/2013  . Essential hypertension, benign 04/01/2013  . Hypothyroidism 04/01/2013  . OSA on CPAP 04/01/2013    [name] Boast., PT 04/27/2020, 2:40 PM  [address]. [place], [name], [address] Phone: [phone]   Fax:  [phone]  Name: Saumya Hukill MRN: [id number] Date of Birth: Oct 12, 1940

## 2020-04-29 ENCOUNTER — Ambulatory Visit: Payer: Medicare PPO | Admitting: Physical Therapy

## 2020-04-29 ENCOUNTER — Encounter: Payer: Self-pay | Admitting: Physical Therapy

## 2020-04-29 ENCOUNTER — Other Ambulatory Visit: Payer: Self-pay

## 2020-04-29 DIAGNOSIS — M62830 Muscle spasm of back: Secondary | ICD-10-CM

## 2020-04-29 DIAGNOSIS — M6281 Muscle weakness (generalized): Secondary | ICD-10-CM | POA: Diagnosis not present

## 2020-04-29 DIAGNOSIS — R2689 Other abnormalities of gait and mobility: Secondary | ICD-10-CM

## 2020-04-29 DIAGNOSIS — Z9181 History of falling: Secondary | ICD-10-CM

## 2020-04-29 DIAGNOSIS — M545 Low back pain, unspecified: Secondary | ICD-10-CM

## 2020-04-29 DIAGNOSIS — R262 Difficulty in walking, not elsewhere classified: Secondary | ICD-10-CM

## 2020-04-29 DIAGNOSIS — R5381 Other malaise: Secondary | ICD-10-CM

## 2020-04-29 NOTE — Therapy (Signed)
[place]. [place], [name], [address] Phone: [phone]   Fax:  [phone]  Physical Therapy Treatment  Patient Details  Name: Amanda Short MRN: [id number] Date of Birth: 1940-09-08 Referring Provider (PT): Rennard   Encounter Date: 04/29/2020   PT End of Session - 04/29/20 1433    Visit Number 34    Date for PT Re-Evaluation 06/20/20    Authorization Type [employer]    PT Start Time 1352    PT Stop Time 1435    PT Time Calculation (min) 43 min    Activity Tolerance Patient tolerated treatment well    Behavior During Therapy [facility] for tasks assessed/performed           Past Medical History:  Diagnosis Date  . Abdominal pain   . Abnormal CT of the abdomen   . Acute blood loss anemia   . Acute cystitis with hematuria 12/19/2019  . Acute encephalopathy 06/06/2017  . Acute pulmonary embolism ([place]) 04/01/2013  . Acute respiratory failure with hypoxia ([place])   . Anxiety state   . Arthritis    "back, hands" (07/22/2015)  . Aspiration pneumonia ([place])   . Benign essential HTN   . Cancer of left breast ([place]) 2006   S/P lumpectomy  . Cellulitis of foot, right 07/21/2015  . Cellulitis of right foot 07/21/2015  . Central retinal vein occlusion of right eye 04/20/2016  . Chronic diastolic CHF (congestive heart failure) ([place]) 07/10/2017  . Colitis 05/27/2017  . Complication of anesthesia    "brief breathing problem at surgery center in ~ 2005 when I had gallbladder OR"  . Concussion 03/2013   due to fall  . Depression   . Diabetes ([place]) 04/01/2013  . 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 Diabetes mellitus type 2, controlled ([place]) 07/21/2015  . Diabetic foot infection ([place]) 07/21/2015  . Diverticulitis of colon   . Diverticulitis of large intestine without perforation or abscess without bleeding   . Diverticulosis   . DVT (deep venous thrombosis) ([place]) 03/2013   LLE  . Encephalopathy   . Encounter for  attention to tracheostomy ([place])   . Encounter for central line placement   . Encounter for orogastric (OG) tube placement   . Essential hypertension, benign 04/01/2013  . GERD (gastroesophageal reflux disease)   . History of acute respiratory failure   . History of pulmonary embolism   . HX: breast cancer 04/01/2013  . Hyperlipidemia   . Hypertension   . Hypocalcemia 05/27/2017  . Hypokalemia 07/10/2017  . Hypomagnesemia 07/11/2017  . Hypothyroidism   . Nausea vomiting and diarrhea 07/10/2017  . Non-intractable cyclical vomiting with nausea   . Nuclear sclerotic cataract of both eyes 04/20/2016  . OSA (obstructive sleep apnea) 07/10/2017  . OSA on CPAP 04/01/2013  . OSA treated with BiPAP   . Physical debility 06/23/2017  . 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 Pulmonary embolism ([place]) 03/2013  . Pulmonary HTN ([place]) 07/05/2017  . Seizure ([place]) 07/10/2017  . Seizure disorder ([place]) 07/05/2017  . Seizures ([place])   . Sepsis ([place]) 07/10/2017  . Sleep apnea   . Status epilepticus ([place])   . 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 Tachypnea   . Thyroid disease    Hypothyroid  . Tracheostomy, acute management ([place])   . Type II diabetes mellitus ([place])     Past Surgical History:  Procedure Laterality Date  . ABDOMINAL HYSTERECTOMY  1970s  . BREAST BIOPSY  Left 2006  . BREAST LUMPECTOMY Left 2006  . BUNIONECTOMY WITH HAMMERTOE RECONSTRUCTION Left   . ESOPHAGOGASTRODUODENOSCOPY N/A 05/29/2017   Procedure: ESOPHAGOGASTRODUODENOSCOPY (EGD);  Surgeon: Irene Shipper, MD;  Location: [facility];  Service: Endoscopy;  Laterality: N/A;  . JOINT REPLACEMENT    . LAPAROSCOPIC CHOLECYSTECTOMY  ~ 2005  . TOTAL HIP ARTHROPLASTY Left 2010  . TOTAL KNEE ARTHROPLASTY Bilateral 2005-2006    There were no vitals filed for this visit.   Subjective Assessment - 04/29/20 1358    Subjective Patient reports that she has been using the oxygen, has not had any heart issues with it racing in the past week     Currently in Pain? No/denies              [facility] PT Assessment - 04/29/20 0001      Berg Balance Test   Sit to Stand Able to stand without using hands and stabilize independently    Standing Unsupported Able to stand safely 2 minutes    Sitting with Back Unsupported but Feet Supported on Floor or Stool Able to sit safely and securely 2 minutes    Stand to Sit Sits safely with minimal use of hands    Transfers Able to transfer safely, minor use of hands    Standing Unsupported with Eyes Closed Able to stand 10 seconds safely    Standing Unsupported with Feet Together Able to place feet together independently and stand 1 minute safely    From Standing, Reach Forward with Outstretched Arm Can reach confidently >25 cm (10")    From Standing Position, Pick up Object from Floor Able to pick up shoe safely and easily    From Standing Position, Turn to Look Behind Over each Shoulder Looks behind one side only/other side shows less weight shift    Turn 360 Degrees Able to turn 360 degrees safely one side only in 4 seconds or less    Standing Unsupported, Alternately Place Feet on Step/Stool Able to stand independently and complete 8 steps >20 seconds    Standing Unsupported, One Foot in Front Able to take small step independently and hold 30 seconds    Standing on One Leg Tries to lift leg/unable to hold 3 seconds but remains standing independently    Total Score 48      Timed Up and Go Test   Normal TUG (seconds) 15                         [facility] Adult PT Treatment/Exercise - 04/29/20 0001      High Level Balance   High Level Balance Activities Negotiating over obstacles    High Level Balance Comments on airex ball toss, walking on balance beam soft,  on airex tband scap stab      Lumbar Exercises: Aerobic   Nustep level 6 x 6 minutes      Lumbar Exercises: Standing   Other Standing Lumbar Exercises 3# marching and hip abduction with walker 2x10 each                     PT Short Term Goals - 12/04/19 1627      PT SHORT TERM GOAL #1   Title independent with intial HEP    Status Achieved             PT Long Term Goals - 04/29/20 1446      PT LONG TERM GOAL #1  Title increase overall hip LE strength for functional gait and safety hips and ankles to 4+/5    Status Partially Met      PT LONG TERM GOAL #2   Title decrease TUG time to less than 14 seconds for functional gait and safety    Status Partially Met      PT LONG TERM GOAL #3   Title increase BERG to 50/56 for safety and independence    Status Partially Met      PT LONG TERM GOAL #4   Title decrease LBP 50% with activities    Status Partially Met      PT LONG TERM GOAL #5   Title increase lumbar ROM 25%    Status Achieved                 Plan - 04/29/20 1435    Clinical Impression Statement Patient now on oxygen, seems to be doing better, her oxygen levels have stayed above 88% - 93%, she reports that she still has some issues with HR, when this happens she is very fatigued, we are working on endurance, she has to rest 3 -4 times with walking 180 feet on grass.  Her TUG has improved and her Merrilee Jansky has improved really decreasing her risk for falls, she will be seeing a pulmonologist in the next two weeks that I hope can help her more.    PT Frequency 2x / week    PT Duration 6 weeks    PT Treatment/Interventions ADLs/Self Care Home Management;Electrical Stimulation;Moist Heat;Traction;Gait training;Neuromuscular re-education;Balance training;Therapeutic exercise;Therapeutic activities;Functional mobility training;Stair training;Patient/family education;Manual techniques    PT Next Visit Plan send in renewal to [facility] and Agree with Plan of Care Patient           Patient will benefit from skilled therapeutic intervention in order to improve the following deficits and impairments:  Abnormal gait, Decreased range of motion, Difficulty walking,  Increased muscle spasms, Decreased activity tolerance, Pain, Decreased balance, Impaired flexibility, Improper body mechanics, Postural dysfunction, Decreased strength, Decreased mobility  Visit Diagnosis: Muscle weakness (generalized)  Risk for falls  Difficulty in walking, not elsewhere classified  Physical debility  Acute bilateral low back pain without sciatica  Other abnormalities of gait and mobility  Muscle spasm of back     Problem List Patient Active Problem List   Diagnosis Date Noted  . Hypoxia 03/23/2020  . Acute cystitis with hematuria 12/19/2019  . Hypomagnesemia 07/11/2017  . Nausea vomiting and diarrhea 07/10/2017  . Hypokalemia 07/10/2017  . Chronic diastolic CHF (congestive heart failure) ([place]) 07/10/2017  . Seizure ([place]) 07/10/2017  . OSA (obstructive sleep apnea) 07/10/2017  . Sepsis ([place]) 07/10/2017  . Seizure disorder ([place]) 07/05/2017  . Pulmonary HTN ([place]) 07/05/2017  . Anxiety state   . Physical debility 06/23/2017  . 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 History of acute respiratory failure   . Tracheostomy, acute management ([place])   . Aspiration pneumonia ([place])   . Encephalopathy   . Status epilepticus ([place])   . Benign essential HTN   . 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 History of pulmonary embolism   . Acute blood loss anemia   . Diverticulitis of colon   . Tachypnea   . Encounter for orogastric (OG) tube placement   . Encounter for central line placement   . Encounter for attention to tracheostomy ([place])   . Acute respiratory failure with hypoxia ([place])   . Acute  encephalopathy 06/06/2017  . Diverticulitis of large intestine without perforation or abscess without bleeding   . Abdominal pain   . Non-intractable cyclical vomiting with nausea   . Abnormal CT of the abdomen   . Hypocalcemia 05/27/2017  . 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 Colitis 05/27/2017  . Central retinal vein occlusion of right eye 04/20/2016  .  Nuclear sclerotic cataract of both eyes 04/20/2016  . Diabetic foot infection ([place]) 07/21/2015  . Diabetes mellitus type 2, controlled ([place]) 07/21/2015  . Cellulitis of foot, right 07/21/2015  . Cellulitis of right foot 07/21/2015  . Acute pulmonary embolism ([place]) 04/01/2013  . DVT (deep venous thrombosis) ([place]) 04/01/2013  . HX: breast cancer 04/01/2013  . Concussion 04/01/2013  . Diabetes ([place]) 04/01/2013  . Essential hypertension, benign 04/01/2013  . Hypothyroidism 04/01/2013  . OSA on CPAP 04/01/2013    [name] Boast., PT 04/29/2020, 3:11 PM  [place]. [place], [name], [address] Phone: [phone]   Fax:  [phone]  Name: Amanda Short MRN: [id number] Date of Birth: 09/12/1940

## 2020-05-11 ENCOUNTER — Ambulatory Visit: Payer: Medicare PPO | Admitting: Physical Therapy

## 2020-05-11 ENCOUNTER — Other Ambulatory Visit: Payer: Self-pay

## 2020-05-11 ENCOUNTER — Encounter: Payer: Self-pay | Admitting: Physical Therapy

## 2020-05-11 DIAGNOSIS — R5381 Other malaise: Secondary | ICD-10-CM

## 2020-05-11 DIAGNOSIS — M6281 Muscle weakness (generalized): Secondary | ICD-10-CM | POA: Diagnosis not present

## 2020-05-11 DIAGNOSIS — M545 Low back pain, unspecified: Secondary | ICD-10-CM

## 2020-05-11 DIAGNOSIS — Z9181 History of falling: Secondary | ICD-10-CM

## 2020-05-11 DIAGNOSIS — M62830 Muscle spasm of back: Secondary | ICD-10-CM

## 2020-05-11 DIAGNOSIS — R262 Difficulty in walking, not elsewhere classified: Secondary | ICD-10-CM

## 2020-05-11 DIAGNOSIS — R2689 Other abnormalities of gait and mobility: Secondary | ICD-10-CM

## 2020-05-11 NOTE — Therapy (Signed)
[place]. [place], [name], [address] Phone: [phone]   Fax:  [phone]  Physical Therapy Treatment  Patient Details  Name: Amanda Short MRN: [id number] Date of Birth: May 13, 1941 Referring Provider (PT): Rennard   Encounter Date: 05/11/2020   PT End of Session - 05/11/20 1054    Visit Number 35    Number of Visits 46    Date for PT Re-Evaluation 06/18/20    Authorization Type [employer]    PT Start Time 1008    PT Stop Time 1052    PT Time Calculation (min) 44 min    Activity Tolerance Patient tolerated treatment well    Behavior During Therapy [facility] for tasks assessed/performed           Past Medical History:  Diagnosis Date  . Abdominal pain   . Abnormal CT of the abdomen   . Acute blood loss anemia   . Acute cystitis with hematuria 12/19/2019  . Acute encephalopathy 06/06/2017  . Acute pulmonary embolism ([place]) 04/01/2013  . Acute respiratory failure with hypoxia ([place])   . Anxiety state   . Arthritis    "back, hands" (07/22/2015)  . Aspiration pneumonia ([place])   . Benign essential HTN   . Cancer of left breast ([place]) 2006   S/P lumpectomy  . Cellulitis of foot, right 07/21/2015  . Cellulitis of right foot 07/21/2015  . Central retinal vein occlusion of right eye 04/20/2016  . Chronic diastolic CHF (congestive heart failure) ([place]) 07/10/2017  . Colitis 05/27/2017  . Complication of anesthesia    "brief breathing problem at surgery center in ~ 2005 when I had gallbladder OR"  . Concussion 03/2013   due to fall  . Depression   . Diabetes ([place]) 04/01/2013  . 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 Diabetes mellitus type 2, controlled ([place]) 07/21/2015  . Diabetic foot infection ([place]) 07/21/2015  . Diverticulitis of colon   . Diverticulitis of large intestine without perforation or abscess without bleeding   . Diverticulosis   . DVT (deep venous thrombosis) ([place]) 03/2013   LLE  . Encephalopathy    . Encounter for attention to tracheostomy ([place])   . Encounter for central line placement   . Encounter for orogastric (OG) tube placement   . Essential hypertension, benign 04/01/2013  . GERD (gastroesophageal reflux disease)   . History of acute respiratory failure   . History of pulmonary embolism   . HX: breast cancer 04/01/2013  . Hyperlipidemia   . Hypertension   . Hypocalcemia 05/27/2017  . Hypokalemia 07/10/2017  . Hypomagnesemia 07/11/2017  . Hypothyroidism   . Nausea vomiting and diarrhea 07/10/2017  . Non-intractable cyclical vomiting with nausea   . Nuclear sclerotic cataract of both eyes 04/20/2016  . OSA (obstructive sleep apnea) 07/10/2017  . OSA on CPAP 04/01/2013  . OSA treated with BiPAP   . Physical debility 06/23/2017  . 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 Pulmonary embolism ([place]) 03/2013  . Pulmonary HTN ([place]) 07/05/2017  . Seizure ([place]) 07/10/2017  . Seizure disorder ([place]) 07/05/2017  . Seizures ([place])   . Sepsis ([place]) 07/10/2017  . Sleep apnea   . Status epilepticus ([place])   . 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 Tachypnea   . Thyroid disease    Hypothyroid  . Tracheostomy, acute management ([place])   . Type II diabetes mellitus ([place])     Past Surgical History:  Procedure Laterality Date  . ABDOMINAL  HYSTERECTOMY  1970s  . BREAST BIOPSY Left 2006  . BREAST LUMPECTOMY Left 2006  . BUNIONECTOMY WITH HAMMERTOE RECONSTRUCTION Left   . ESOPHAGOGASTRODUODENOSCOPY N/A 05/29/2017   Procedure: ESOPHAGOGASTRODUODENOSCOPY (EGD);  Surgeon: Irene Shipper, MD;  Location: [facility];  Service: Endoscopy;  Laterality: N/A;  . JOINT REPLACEMENT    . LAPAROSCOPIC CHOLECYSTECTOMY  ~ 2005  . TOTAL HIP ARTHROPLASTY Left 2010  . TOTAL KNEE ARTHROPLASTY Bilateral 2005-2006    There were no vitals filed for this visit.   Subjective Assessment - 05/11/20 1016    Subjective Patient reports that she had a cold last week and had more difficulty breathing    Currently in  Pain? No/denies                             [facility] Adult PT Treatment/Exercise - 05/11/20 0001      Ambulation/Gait   Gait Comments Gait with light HHA, 120 feet needed 4 rest breaks due to fatigue      High Level Balance   High Level Balance Activities Negotiating over obstacles    High Level Balance Comments on airex ball toss, walking on balance beam soft,  on airex tband scap stab      Lumbar Exercises: Aerobic   Nustep level 5 x 7 minutes      Lumbar Exercises: Machines for Strengthening   Cybex Knee Extension 10# 2x15    Cybex Knee Flexion 25# 2x15      Lumbar Exercises: Standing   Other Standing Lumbar Exercises 3# marching and hip abduction with walker 2x10 each                    PT Short Term Goals - 12/04/19 1627      PT SHORT TERM GOAL #1   Title independent with intial HEP    Status Achieved             PT Long Term Goals - 05/11/20 1057      PT LONG TERM GOAL #1   Title increase overall hip LE strength for functional gait and safety hips and ankles to 4+/5    Status Partially Met      PT LONG TERM GOAL #2   Title decrease TUG time to less than 14 seconds for functional gait and safety    Status Partially Met                 Plan - 05/11/20 1055    Clinical Impression Statement Patietn was sick last week, reports fatigue today, reports that she is on the oxygen at 3L most of the time, reports that a few times she goes without but her O2 saturation will drop into the low 80's and upper 70's and then will feel bad, I asked her to talk with the pulmunologist this week about this.  She had much more difficulty walking today requiring 4 rests wiht walking about 120 feet    PT Next Visit Plan will work on strength, endurance    Consulted and Agree with Plan of Care Patient           Patient will benefit from skilled therapeutic intervention in order to improve the following deficits and impairments:     Visit  Diagnosis: Muscle weakness (generalized)  Risk for falls  Difficulty in walking, not elsewhere classified  Physical debility  Acute bilateral low back pain without sciatica  Other abnormalities of  gait and mobility  Muscle spasm of back     Problem List Patient Active Problem List   Diagnosis Date Noted  . Hypoxia 03/23/2020  . Acute cystitis with hematuria 12/19/2019  . Hypomagnesemia 07/11/2017  . Nausea vomiting and diarrhea 07/10/2017  . Hypokalemia 07/10/2017  . Chronic diastolic CHF (congestive heart failure) ([place]) 07/10/2017  . Seizure ([place]) 07/10/2017  . OSA (obstructive sleep apnea) 07/10/2017  . Sepsis ([place]) 07/10/2017  . Seizure disorder ([place]) 07/05/2017  . Pulmonary HTN ([place]) 07/05/2017  . Anxiety state   . Physical debility 06/23/2017  . 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 History of acute respiratory failure   . Tracheostomy, acute management ([place])   . Aspiration pneumonia ([place])   . Encephalopathy   . Status epilepticus ([place])   . Benign essential HTN   . 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 History of pulmonary embolism   . Acute blood loss anemia   . Diverticulitis of colon   . Tachypnea   . Encounter for orogastric (OG) tube placement   . Encounter for central line placement   . Encounter for attention to tracheostomy ([place])   . Acute respiratory failure with hypoxia ([place])   . Acute encephalopathy 06/06/2017  . Diverticulitis of large intestine without perforation or abscess without bleeding   . Abdominal pain   . Non-intractable cyclical vomiting with nausea   . Abnormal CT of the abdomen   . Hypocalcemia 05/27/2017  . 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 Colitis 05/27/2017  . Central retinal vein occlusion of right eye 04/20/2016  . Nuclear sclerotic cataract of both eyes 04/20/2016  . Diabetic foot infection ([place]) 07/21/2015  . Diabetes mellitus type 2, controlled ([place]) 07/21/2015  . Cellulitis of foot, right 07/21/2015  .  Cellulitis of right foot 07/21/2015  . Acute pulmonary embolism ([place]) 04/01/2013  . DVT (deep venous thrombosis) ([place]) 04/01/2013  . HX: breast cancer 04/01/2013  . Concussion 04/01/2013  . Diabetes ([place]) 04/01/2013  . Essential hypertension, benign 04/01/2013  . Hypothyroidism 04/01/2013  . OSA on CPAP 04/01/2013    [name] Boast., PT 05/11/2020, 10:57 AM  [place]. [place], [name], [address] Phone: [phone]   Fax:  [phone]  Name: Nakira Litzau MRN: [id number] Date of Birth: 08/21/1940

## 2020-05-12 ENCOUNTER — Other Ambulatory Visit: Payer: Self-pay | Admitting: Family Medicine

## 2020-05-12 ENCOUNTER — Ambulatory Visit: Payer: Medicare PPO | Admitting: Podiatry

## 2020-05-12 ENCOUNTER — Encounter: Payer: Self-pay | Admitting: Physical Therapy

## 2020-05-12 ENCOUNTER — Ambulatory Visit: Payer: Medicare PPO | Admitting: Physical Therapy

## 2020-05-12 DIAGNOSIS — R5381 Other malaise: Secondary | ICD-10-CM

## 2020-05-12 DIAGNOSIS — E118 Type 2 diabetes mellitus with unspecified complications: Secondary | ICD-10-CM

## 2020-05-12 DIAGNOSIS — Z9181 History of falling: Secondary | ICD-10-CM

## 2020-05-12 DIAGNOSIS — M545 Low back pain, unspecified: Secondary | ICD-10-CM

## 2020-05-12 DIAGNOSIS — M6281 Muscle weakness (generalized): Secondary | ICD-10-CM | POA: Diagnosis not present

## 2020-05-12 DIAGNOSIS — R262 Difficulty in walking, not elsewhere classified: Secondary | ICD-10-CM

## 2020-05-12 DIAGNOSIS — M62830 Muscle spasm of back: Secondary | ICD-10-CM

## 2020-05-12 DIAGNOSIS — R2689 Other abnormalities of gait and mobility: Secondary | ICD-10-CM

## 2020-05-12 NOTE — Therapy (Signed)
[place]. [place], [name], [address] Phone: [phone]   Fax:  [phone]  Physical Therapy Treatment  Patient Details  Name: Amanda Short MRN: [id number] Date of Birth: 31-May-1941 Referring Provider (PT): Rennard   Encounter Date: 05/12/2020   PT End of Session - 05/12/20 1614    Visit Number 36    Number of Visits 46    Date for PT Re-Evaluation 06/18/20    PT Start Time 1528    PT Stop Time 1610    PT Time Calculation (min) 42 min    Activity Tolerance Patient tolerated treatment well    Behavior During Therapy [facility] for tasks assessed/performed           Past Medical History:  Diagnosis Date  . Abdominal pain   . Abnormal CT of the abdomen   . Acute blood loss anemia   . Acute cystitis with hematuria 12/19/2019  . Acute encephalopathy 06/06/2017  . Acute pulmonary embolism ([place]) 04/01/2013  . Acute respiratory failure with hypoxia ([place])   . Anxiety state   . Arthritis    "back, hands" (07/22/2015)  . Aspiration pneumonia ([place])   . Benign essential HTN   . Cancer of left breast ([place]) 2006   S/P lumpectomy  . Cellulitis of foot, right 07/21/2015  . Cellulitis of right foot 07/21/2015  . Central retinal vein occlusion of right eye 04/20/2016  . Chronic diastolic CHF (congestive heart failure) ([place]) 07/10/2017  . Colitis 05/27/2017  . Complication of anesthesia    "brief breathing problem at surgery center in ~ 2005 when I had gallbladder OR"  . Concussion 03/2013   due to fall  . Depression   . Diabetes ([place]) 04/01/2013  . 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 Diabetes mellitus type 2, controlled ([place]) 07/21/2015  . Diabetic foot infection ([place]) 07/21/2015  . Diverticulitis of colon   . Diverticulitis of large intestine without perforation or abscess without bleeding   . Diverticulosis   . DVT (deep venous thrombosis) ([place]) 03/2013   LLE  . Encephalopathy   . Encounter for attention to  tracheostomy ([place])   . Encounter for central line placement   . Encounter for orogastric (OG) tube placement   . Essential hypertension, benign 04/01/2013  . GERD (gastroesophageal reflux disease)   . History of acute respiratory failure   . History of pulmonary embolism   . HX: breast cancer 04/01/2013  . Hyperlipidemia   . Hypertension   . Hypocalcemia 05/27/2017  . Hypokalemia 07/10/2017  . Hypomagnesemia 07/11/2017  . Hypothyroidism   . Nausea vomiting and diarrhea 07/10/2017  . Non-intractable cyclical vomiting with nausea   . Nuclear sclerotic cataract of both eyes 04/20/2016  . OSA (obstructive sleep apnea) 07/10/2017  . OSA on CPAP 04/01/2013  . OSA treated with BiPAP   . Physical debility 06/23/2017  . 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 Pulmonary embolism ([place]) 03/2013  . Pulmonary HTN ([place]) 07/05/2017  . Seizure ([place]) 07/10/2017  . Seizure disorder ([place]) 07/05/2017  . Seizures ([place])   . Sepsis ([place]) 07/10/2017  . Sleep apnea   . Status epilepticus ([place])   . 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 Tachypnea   . Thyroid disease    Hypothyroid  . Tracheostomy, acute management ([place])   . Type II diabetes mellitus ([place])     Past Surgical History:  Procedure Laterality Date  . ABDOMINAL HYSTERECTOMY  1970s  . BREAST  BIOPSY Left 2006  . BREAST LUMPECTOMY Left 2006  . BUNIONECTOMY WITH HAMMERTOE RECONSTRUCTION Left   . ESOPHAGOGASTRODUODENOSCOPY N/A 05/29/2017   Procedure: ESOPHAGOGASTRODUODENOSCOPY (EGD);  Surgeon: Irene Shipper, MD;  Location: [facility];  Service: Endoscopy;  Laterality: N/A;  . JOINT REPLACEMENT    . LAPAROSCOPIC CHOLECYSTECTOMY  ~ 2005  . TOTAL HIP ARTHROPLASTY Left 2010  . TOTAL KNEE ARTHROPLASTY Bilateral 2005-2006    There were no vitals filed for this visit.   Subjective Assessment - 05/12/20 1531    Subjective Patient reports that she did not sleep last night, she reports that she is tired    Currently in Pain? No/denies                              [facility] Adult PT Treatment/Exercise - 05/12/20 0001      Ambulation/Gait   Gait Comments gait 100 feet one rest break      High Level Balance   High Level Balance Comments on airex red tband scapular stabiliszation, on airex 4" toe touches      Lumbar Exercises: Stretches   Passive Hamstring Stretch Right;Left;4 reps;20 seconds    Lower Trunk Rotation 4 reps;20 seconds    Piriformis Stretch Right;Left;4 reps;20 seconds      Lumbar Exercises: Machines for Strengthening   Cybex Knee Extension 10# 2x15    Cybex Knee Flexion 25# 2x15      Lumbar Exercises: Standing   Other Standing Lumbar Exercises ankle exercises on sit fit and red tband ankle PF/DF, difficulty with this      Lumbar Exercises: Supine   Bridge with [employer] 10 reps;1 second    Bridge with [employer] Limitations very small raises    Other Supine Lumbar Exercises feet on ball K2C, trunk rotation, small bridges and isometric abs                    PT Short Term Goals - 12/04/19 1627      PT SHORT TERM GOAL #1   Title independent with intial HEP    Status Achieved             PT Long Term Goals - 05/12/20 1616      PT LONG TERM GOAL #1   Title increase overall hip LE strength for functional gait and safety hips and ankles to 4+/5    Status Partially Met      PT LONG TERM GOAL #2   Title decrease TUG time to less than 14 seconds for functional gait and safety    Status Partially Met                 Plan - 05/12/20 1614    Clinical Impression Statement Patient seen two days in a row so I mixed things up today, she reports stiffness in the low back and not able to sleep last night, she did have difficulty with the ankle exercises, a lack of coordination, with the walking she only had to rest once today compared to 3-4 rests yesterday    PT Next Visit Plan will work on strength, endurance    Consulted and Agree with Plan of Care Patient            Patient will benefit from skilled therapeutic intervention in order to improve the following deficits and impairments:  Abnormal gait, Decreased range of motion, Difficulty walking, Increased muscle spasms, Decreased activity tolerance,  Pain, Decreased balance, Impaired flexibility, Improper body mechanics, Postural dysfunction, Decreased strength, Decreased mobility  Visit Diagnosis: Muscle weakness (generalized)  Risk for falls  Difficulty in walking, not elsewhere classified  Physical debility  Acute bilateral low back pain without sciatica  Other abnormalities of gait and mobility  Muscle spasm of back     Problem List Patient Active Problem List   Diagnosis Date Noted  . Hypoxia 03/23/2020  . Acute cystitis with hematuria 12/19/2019  . Hypomagnesemia 07/11/2017  . Nausea vomiting and diarrhea 07/10/2017  . Hypokalemia 07/10/2017  . Chronic diastolic CHF (congestive heart failure) ([place]) 07/10/2017  . Seizure ([place]) 07/10/2017  . OSA (obstructive sleep apnea) 07/10/2017  . Sepsis ([place]) 07/10/2017  . Seizure disorder ([place]) 07/05/2017  . Pulmonary HTN ([place]) 07/05/2017  . Anxiety state   . Physical debility 06/23/2017  . 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 History of acute respiratory failure   . Tracheostomy, acute management ([place])   . Aspiration pneumonia ([place])   . Encephalopathy   . Status epilepticus ([place])   . Benign essential HTN   . 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 History of pulmonary embolism   . Acute blood loss anemia   . Diverticulitis of colon   . Tachypnea   . Encounter for orogastric (OG) tube placement   . Encounter for central line placement   . Encounter for attention to tracheostomy ([place])   . Acute respiratory failure with hypoxia ([place])   . Acute encephalopathy 06/06/2017  . Diverticulitis of large intestine without perforation or abscess without bleeding   . Abdominal pain   . Non-intractable cyclical vomiting with  nausea   . Abnormal CT of the abdomen   . Hypocalcemia 05/27/2017  . 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 Colitis 05/27/2017  . Central retinal vein occlusion of right eye 04/20/2016  . Nuclear sclerotic cataract of both eyes 04/20/2016  . Diabetic foot infection ([place]) 07/21/2015  . Diabetes mellitus type 2, controlled ([place]) 07/21/2015  . Cellulitis of foot, right 07/21/2015  . Cellulitis of right foot 07/21/2015  . Acute pulmonary embolism ([place]) 04/01/2013  . DVT (deep venous thrombosis) ([place]) 04/01/2013  . HX: breast cancer 04/01/2013  . Concussion 04/01/2013  . Diabetes ([place]) 04/01/2013  . Essential hypertension, benign 04/01/2013  . Hypothyroidism 04/01/2013  . OSA on CPAP 04/01/2013    [name] Boast., PT 05/12/2020, 4:19 PM  [place]. [place], [name], [address] Phone: [phone]   Fax:  [phone]  Name: Amanda Short MRN: [id number] Date of Birth: October 23, 1940

## 2020-05-13 NOTE — Progress Notes (Signed)
[facility]ED [facility]IN[facility] CONSULT NOTE  Referring Physician: Dr. Agustin Cree Primary Care: Libby Maw, MD Primary Cardiologist: Dr. Agustin Cree  HPI:  Amanda Short is a 79 y.o. female with multiple medical issues including DM2, HTN. OSA previously on CPAP )not using for several months), previous smoker (quit 1995), previous DVT/PE and h/o chronic respiratory failure s/p previous tracheostomy (removed) referred by Dr. Drue Dun for further evaluation of pulmonary HTN seen on echo.   Echo 7/21: LVEF 65-705 with restrictive diastolic filling  RV moderately enlarged with moderate HK. Mod to severe TR. Severe septal flattening RVSP estimated at 108.  CT abdomen 2/21: showed calcified granuloma in RLL VQ scan 8/21: Normal  Reports heart cath in [place] 6 years ago and told she had pulmonary HTN. She doesn't remember details but said they did not prescribe any medications.   In 2018 had severe n/v/d and led to intractable seizures. Got extubated but then developed recurrent distress but could not be re-intubated due to laryngeal swelling. Had emergent trach which was removed after several weeks. Smoked 1ppd x 20+ years. Quit in 1995.   Stopped on home 3L O2 by Dr. Ethelene Hal about 3 weeks ago. Had been going to PT and therapist noted sats in 70s with exertion. Still going to PT for 51mn 2x/week. Doing bike and other exercises include balance training. Feels it is helping. Can do light housework but nothing heavier due to marked DOE. Mild LE edema at end of day. No recent syncope.     Review of Systems: [y] = yes, _0  = no   General: Weight gain _1 ; Weight loss _2 ; Anorexia _3 ; Fatigue [Blue.Reese]; Fever _4 ; Chills _5 ; Weakness [Blue.[place]ese]  Cardiac: Chest pain/pressure _6 ; Resting SOB [Blue.Reese]; Exertional SOB [y]; Orthopnea _7 ; Pedal Edema _8 ; Palpitations _9 ; Syncope _10 ; Presyncope _11 ; Paroxysmal nocturnal dyspnea_12   Pulmonary: Cough [Blue.Reese]; Wheezing_13 ; Hemoptysis_14 ; Sputum _15 ; Snoring _16     GI: Vomiting_17 ; Dysphagia_18 ; Melena_19 ; Hematochezia _20 ; Heartburn_21 ; Abdominal pain _22 ; Constipation _23 ; Diarrhea _24 ; BRBPR _25   GU: Hematuria_26 ; Dysuria _27 ; Nocturia_28   Vascular: Pain in legs with walking _29 ; Pain in feet with lying flat _30 ; Non-healing sores _31 ; Stroke _32 ; TIA _33 ; Slurred speech _34 ;  Neuro: Headaches_35 ; Vertigo_36 ; Seizures_37 ; Paresthesias_38 ;Blurred vision _39 ; Diplopia _40 ; Vision changes _41   Ortho/Skin: Arthritis [ y]; Joint pain [ y]; Muscle pain _42 ; Joint swelling _43 ; Back Pain _44 ; Rash _45   Psych: Depression[y ]; Anxiety[y ]  Heme: Bleeding problems _46 ; Clotting disorders _47 ; Anemia _48   Endocrine: Diabetes [ y]; Thyroid dysfunction[ y]   Past Medical History:  Diagnosis Date  . Abdominal pain   . Abnormal CT of the abdomen   . Acute blood loss anemia   . Acute cystitis with hematuria 12/19/2019  . Acute encephalopathy 06/06/2017  . Acute pulmonary embolism ([place] 04/01/2013  . Acute respiratory failure with hypoxia ([place]   . Anxiety state   . Arthritis    "back, hands" (07/22/2015)  . Aspiration pneumonia ([place]   . Benign essential HTN   . Cancer of left breast ([place] 2006   S/P lumpectomy  . Cellulitis of foot, right 07/21/2015  . Cellulitis of right foot 07/21/2015  . Central retinal vein occlusion of right eye 04/20/2016  .  Chronic diastolic CHF (congestive heart failure) ([employer]) 07/10/2017  . Colitis 05/27/2017  . Complication of anesthesia    "brief breathing problem at surgery center in ~ 2005 when I had gallbladder OR"  . Concussion 03/2013   due to fall  . Depression   . Diabetes ([place]) 04/01/2013  . 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 Diabetes mellitus type 2, controlled ([place]) 07/21/2015  . Diabetic foot infection ([place]) 07/21/2015  . Diverticulitis of colon   . Diverticulitis of large intestine without perforation or abscess without bleeding   . Diverticulosis   . DVT (deep venous thrombosis) ([place]) 03/2013   LLE  .  Encephalopathy   . Encounter for attention to tracheostomy ([place])   . Encounter for central line placement   . Encounter for orogastric (OG) tube placement   . Essential hypertension, benign 04/01/2013  . GERD (gastroesophageal reflux disease)   . History of acute respiratory failure   . History of pulmonary embolism   . HX: breast cancer 04/01/2013  . Hyperlipidemia   . Hypertension   . Hypocalcemia 05/27/2017  . Hypokalemia 07/10/2017  . Hypomagnesemia 07/11/2017  . Hypothyroidism   . Nausea vomiting and diarrhea 07/10/2017  . Non-intractable cyclical vomiting with nausea   . Nuclear sclerotic cataract of both eyes 04/20/2016  . OSA (obstructive sleep apnea) 07/10/2017  . OSA on CPAP 04/01/2013  . OSA treated with BiPAP   . Physical debility 06/23/2017  . 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 Pulmonary embolism ([place]) 03/2013  . Pulmonary HTN ([place]) 07/05/2017  . Seizure ([place]) 07/10/2017  . Seizure disorder ([place]) 07/05/2017  . Seizures ([place])   . Sepsis ([place]) 07/10/2017  . Sleep apnea   . Status epilepticus ([place])   . 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 Tachypnea   . Thyroid disease    Hypothyroid  . Tracheostomy, acute management ([place])   . Type II diabetes mellitus ([place])     Current Outpatient Medications  Medication Sig Dispense Refill  . acetaminophen (TYLENOL) 500 MG tablet Take 500 mg by mouth every 6 (six) hours as needed for mild pain.    Marland Kitchen ALPRAZolam (XANAX) 0.5 MG tablet TAKE 1 TABLET BY MOUTH EVERYDAY AT BEDTIME 30 tablet 4  . atorvastatin (LIPITOR) 20 MG tablet Take 20 mg by mouth daily.    . BD PEN NEEDLE NANO 2ND GEN 32G X 4 MM MISC daily.    . calcium-vitamin D (OSCAL WITH D) 500-200 MG-UNIT tablet Take 1 tablet by mouth 2 (two) times daily.    Marland Kitchen diltiazem (CARDIZEM) 30 MG tablet Take 30 mg by mouth daily.     . DULoxetine (CYMBALTA) 60 MG capsule Take 60 mg by mouth daily.    . febuxostat (ULORIC) 40 MG tablet Take 40 mg daily by mouth.    . furosemide (LASIX)  40 MG tablet Take 40 mg by mouth daily.    . insulin degludec (TRESIBA FLEXTOUCH) 100 UNIT/ML SOPN FlexTouch Pen     . levETIRAcetam (KEPPRA) 500 MG tablet Take 500 mg by mouth 2 (two) times daily.    Marland Kitchen levothyroxine (SYNTHROID, LEVOTHROID) 100 MCG tablet Take 100 mcg by mouth daily before breakfast.    . metoprolol (TOPROL-XL) 200 MG 24 hr tablet Take 100 mg by mouth at bedtime.     . montelukast (SINGULAIR) 10 MG tablet Take 10 mg at bedtime by mouth.    . Multiple Vitamin (MULTIVITAMIN WITH MINERALS) TABS tablet Take 1 tablet daily by mouth.    Marland Kitchen  nateglinide (STARLIX) 120 MG tablet Take 120 mg by mouth 3 (three) times daily with meals.     Marland Kitchen omeprazole (PRILOSEC) 40 MG capsule Take 40 mg by mouth 2 (two) times daily.    Marland Kitchen OZEMPIC, 0.25 OR 0.5 MG/DOSE, 2 MG/1.5ML SOPN INJECT 0.5 MG WEEKLY 4.5 mL 4  . Rivaroxaban (XARELTO) 15 MG TABS tablet Take 15 mg by mouth daily.    Marland Kitchen spironolactone (ALDACTONE) 25 MG tablet Take 12.5 mg by mouth daily.     No current facility-administered medications for this encounter.    Allergies  Allergen Reactions  . Allopurinol Nausea And Vomiting      Social History   Socioeconomic History  . Marital status: Married    Spouse name: Not on file  . Number of children: 1  . Years of education: Not on file  . Highest education level: Not on file  Occupational History  . Occupation: retired  Tobacco Use  . Smoking status: Former Smoker    Packs/day: 1.00    Years: 30.00    Pack years: 30.00    Types: Cigarettes    Quit date: 1995    Years since quitting: 26.8  . Smokeless tobacco: Never Used  . Tobacco comment: "quit smoking cigarettes in the 1990s"  Vaping Use  . Vaping Use: Never used  Substance and Sexual Activity  . Alcohol use: No  . Drug use: No  . Sexual activity: Not Currently  Other Topics Concern  . Not on file  Social History Narrative  . Not on file   Social Determinants of Health   Financial Resource Strain: Low Risk   .  Difficulty of Paying Living Expenses: Not hard at all  Food Insecurity: No Food Insecurity  . Worried About Charity fundraiser in the Last Year: Never true  . Ran Out of Food in the Last Year: Never true  Transportation Needs: No Transportation Needs  . Lack of Transportation (Medical): No  . Lack of Transportation (Non-Medical): No  Physical Activity: Insufficiently Active  . Days of Exercise per Week: 4 days  . Minutes of Exercise per Session: 30 min  Stress: No Stress Concern Present  . Feeling of Stress : Not at all  Social Connections: Moderately Isolated  . Frequency of Communication with Friends and Family: More than three times a week  . Frequency of Social Gatherings with Friends and Family: Never  . Attends Religious Services: Never  . Active Member of Clubs or Organizations: No  . Attends Archivist Meetings: Never  . Marital Status: Married  Human resources officer Violence: Not At Risk  . Fear of Current or Ex-Partner: No  . Emotionally Abused: No  . Physically Abused: No  . Sexually Abused: No      Family History  Problem Relation Age of Onset  . Diabetes Other   . Breast cancer Sister   . Liver cancer Mother   . Uterine cancer Mother   . Hypertension Mother   . Colon cancer Maternal Grandmother   . Breast cancer Cousin        couple  . Kidney disease Sister   . Diabetes Sister     Vitals:   05/14/20 1134  BP: 133/87  Pulse: 85  SpO2: 92%  Weight: 70.9 kg (156 lb 6.4 oz)    PHYSICAL EXAM: General:  Elderly. Weak appearing. No respiratory difficulty HEENT: normal Neck: supple. no JVD. Carotids 2+ bilat; no bruits. No lymphadenopathy or thryomegaly appreciated. Cor: PMI  nondisplaced. Regular rate & rhythm. No rubs, gallops or murmurs. Lungs: clear Abdomen: soft, nontender, nondistended. No hepatosplenomegaly. No bruits or masses. Good bowel sounds. Extremities: no cyanosis, clubbing, rash, edema Neuro: alert & oriented x 3, cranial nerves  grossly intact. moves all 4 extremities w/o difficulty. Affect pleasant.  ECG: NSR 66 RAD. Nonspecific ST-T abnormalities Personally reviewed    ASSESSMENT & PLAN:  1. 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 Mod to severe TR. Severe septal flattening RVSP estimated at 108.  - CT abdomen 2/21: showed calcified granuloma in RLL - VQ scan 8/21: Normal - Suspect multifactorial - Will plan R/L HC and PFTs to assess whether she will be candidate for pulmonary vasodilators - Continue supplemental O2 - Need to restart CPAP (needs repeat sleep study) - Check auto-immune serology  2. Chronic hypoxic respiratory failure - quit tobacco 1995 - on home O2 at 3L - has h/o tracheostomy - check PFTs  3. OSA - need to restart CPAP  - will repeat sleep study as not done for many years   Glori Bickers, MD  12:02 PM

## 2020-05-13 NOTE — H&P (View-Only) (Signed)
 [facility]ED [facility] CLINIC CONSULT NOTE  Referring Physician: Dr. Krasowski Primary Care: Kremer, William Alfred, MD Primary Cardiologist: Dr. Krasowski  HPI:  Amanda Short is a 79 y.o. female with multiple medical issues including DM2, HTN. OSA previously on CPAP )not using for several months), previous smoker (quit 1995), previous DVT/PE and h/o chronic respiratory failure s/p previous tracheostomy (removed) referred by Dr. 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   Echo 7/21: LVEF 65-705 with restrictive diastolic filling  RV moderately enlarged with moderate HK. Mod to severe TR. Severe septal flattening RVSP estimated at 108.  CT abdomen 2/21: showed calcified granuloma in RLL VQ scan 8/21: Normal  Reports heart cath in [place] 6 years ago and told she had pulmonary HTN. She doesn't remember details but said they did not prescribe any medications.   In 2018 had severe n/v/d and led to intractable seizures. Got extubated but then developed recurrent distress but could not be re-intubated due to laryngeal swelling. Had emergent trach which was removed after several weeks. Smoked 1ppd x 20+ years. Quit in 1995.   Stopped on home 3L O2 by Dr. Kremer about 3 weeks ago. Had been going to PT and therapist noted sats in 70s with exertion. Still going to PT for 45min 2x/week. Doing bike and other exercises include balance training. Feels it is helping. Can do light housework but nothing heavier due to marked DOE. Mild LE edema at end of day. No recent syncope.     Review of Systems: [y] = yes, [ ] = no   General: Weight gain [ ]; Weight loss [ ]; Anorexia [ ]; Fatigue [y ]; Fever [ ]; Chills [ ]; Weakness [y ]  Cardiac: Chest pain/pressure [ ]; Resting SOB [y ]; Exertional SOB [y]; Orthopnea [ ]; Pedal Edema [ ]; Palpitations [ ]; Syncope [ ]; Presyncope [ ]; Paroxysmal nocturnal dyspnea[ ]  Pulmonary: Cough [y ]; Wheezing[ ]; Hemoptysis[ ]; Sputum [ ]; Snoring [ ]    GI: Vomiting[ ]; Dysphagia[ ]; Melena[ ]; Hematochezia [ ]; Heartburn[ ]; Abdominal pain [ ]; Constipation [ ]; Diarrhea [ ]; BRBPR [ ]  GU: Hematuria[ ]; Dysuria [ ]; Nocturia[ ]  Vascular: Pain in legs with walking [ ]; Pain in feet with lying flat [ ]; Non-healing sores [ ]; Stroke [ ]; TIA [ ]; Slurred speech [ ];  Neuro: Headaches[ ]; Vertigo[ ]; Seizures[ ]; Paresthesias[ ];Blurred vision [ ]; Diplopia [ ]; Vision changes [ ]  Ortho/Skin: Arthritis [ y]; Joint pain [ y]; Muscle pain [ ]; Joint swelling [ ]; Back Pain [ ]; Rash [ ]  Psych: Depression[y ]; Anxiety[y ]  Heme: Bleeding problems [ ]; Clotting disorders [ ]; Anemia [ ]  Endocrine: Diabetes [ y]; Thyroid dysfunction[ y]   Past Medical History:  Diagnosis Date  . Abdominal pain   . Abnormal CT of the abdomen   . Acute blood loss anemia   . Acute cystitis with hematuria 12/19/2019  . Acute encephalopathy 06/06/2017  . Acute pulmonary embolism (HCC) 04/01/2013  . Acute respiratory failure with hypoxia (HCC)   . Anxiety state   . Arthritis    "back, hands" (07/22/2015)  . Aspiration pneumonia (HCC)   . Benign essential HTN   . Cancer of left breast (HCC) 2006   S/P lumpectomy  . Cellulitis of foot, right 07/21/2015  . Cellulitis of right foot 07/21/2015  . Central retinal vein occlusion of right eye 04/20/2016  .   Chronic diastolic CHF (congestive heart failure) ([place]) 07/10/2017   Colitis 87/68/1157   Complication of anesthesia    "brief breathing problem at surgery center in ~ 2005 when I had gallbladder OR"   Concussion 03/2013   due to fall   Depression    Diabetes ([place]) 04/01/2013   Diabetes mellitus type 2 in nonobese [facility])    Diabetes mellitus type 2, controlled ([place]) 07/21/2015   Diabetic foot infection ([place]) 07/21/2015   Diverticulitis of colon    Diverticulitis of large intestine without perforation or abscess without bleeding    Diverticulosis    DVT (deep venous thrombosis) ([place]) 03/2013   LLE    Encephalopathy    Encounter for attention to tracheostomy [facility])    Encounter for central line placement    Encounter for orogastric (OG) tube placement    Essential hypertension, benign 04/01/2013   GERD (gastroesophageal reflux disease)    History of acute respiratory failure    History of pulmonary embolism    HX: breast cancer 04/01/2013   Hyperlipidemia    Hypertension    Hypocalcemia 05/27/2017   Hypokalemia 07/10/2017   Hypomagnesemia 07/11/2017   Hypothyroidism    Nausea vomiting and diarrhea 07/10/2017   Non-intractable cyclical vomiting with nausea    Nuclear sclerotic cataract of both eyes 04/20/2016   OSA (obstructive sleep apnea) 07/10/2017   OSA on CPAP 04/01/2013   OSA treated with BiPAP    Physical debility 06/23/2017   Prolonged QT interval 05/27/2017   Pulmonary embolism ([place]) 03/2013   Pulmonary HTN ([place]) 07/05/2017   Seizure ([place]) 07/10/2017   Seizure disorder ([place]) 07/05/2017   Seizures ([place])    Sepsis ([place]) 07/10/2017   Sleep apnea    Status epilepticus ([place])    Status post trachelectomy 06/23/2017   Tachypnea    Thyroid disease    Hypothyroid   Tracheostomy, acute management ([place])    Type II diabetes mellitus ([place])     Current Outpatient Medications  Medication Sig Dispense Refill   acetaminophen (TYLENOL) 500 MG tablet Take 500 mg by mouth every 6 (six) hours as needed for mild pain.     ALPRAZolam (XANAX) 0.5 MG tablet TAKE 1 TABLET BY MOUTH EVERYDAY AT BEDTIME 30 tablet 4   atorvastatin (LIPITOR) 20 MG tablet Take 20 mg by mouth daily.     BD PEN NEEDLE NANO 2ND GEN 32G X 4 MM MISC daily.     calcium-vitamin D (OSCAL WITH D) 500-200 MG-UNIT tablet Take 1 tablet by mouth 2 (two) times daily.     diltiazem (CARDIZEM) 30 MG tablet Take 30 mg by mouth daily.      DULoxetine (CYMBALTA) 60 MG capsule Take 60 mg by mouth daily.     febuxostat (ULORIC) 40 MG tablet Take 40 mg daily by mouth.     furosemide (LASIX)  40 MG tablet Take 40 mg by mouth daily.     insulin degludec (TRESIBA FLEXTOUCH) 100 UNIT/ML SOPN FlexTouch Pen      levETIRAcetam (KEPPRA) 500 MG tablet Take 500 mg by mouth 2 (two) times daily.     levothyroxine (SYNTHROID, LEVOTHROID) 100 MCG tablet Take 100 mcg by mouth daily before breakfast.     metoprolol (TOPROL-XL) 200 MG 24 hr tablet Take 100 mg by mouth at bedtime.      montelukast (SINGULAIR) 10 MG tablet Take 10 mg at bedtime by mouth.     Multiple Vitamin (MULTIVITAMIN WITH MINERALS) TABS tablet Take 1 tablet daily by mouth.  nateglinide (STARLIX) 120 MG tablet Take 120 mg by mouth 3 (three) times daily with meals.     . omeprazole (PRILOSEC) 40 MG capsule Take 40 mg by mouth 2 (two) times daily.    . OZEMPIC, 0.25 OR 0.5 MG/DOSE, 2 MG/1.5ML SOPN INJECT 0.5 MG WEEKLY 4.5 mL 4  . Rivaroxaban (XARELTO) 15 MG TABS tablet Take 15 mg by mouth daily.    . spironolactone (ALDACTONE) 25 MG tablet Take 12.5 mg by mouth daily.     No current facility-administered medications for this encounter.    Allergies  Allergen Reactions  . Allopurinol Nausea And Vomiting      Social History   Socioeconomic History  . Marital status: Married    Spouse name: Not on file  . Number of children: 1  . Years of education: Not on file  . Highest education level: Not on file  Occupational History  . Occupation: retired  Tobacco Use  . Smoking status: Former Smoker    Packs/day: 1.00    Years: 30.00    Pack years: 30.00    Types: Cigarettes    Quit date: 1995    Years since quitting: 26.8  . Smokeless tobacco: Never Used  . Tobacco comment: "quit smoking cigarettes in the 1990s"  Vaping Use  . Vaping Use: Never used  Substance and Sexual Activity  . Alcohol use: No  . Drug use: No  . Sexual activity: Not Currently  Other Topics Concern  . Not on file  Social History Narrative  . Not on file   Social Determinants of Health   Financial Resource Strain: Low Risk   .  Difficulty of Paying Living Expenses: Not hard at all  Food Insecurity: No Food Insecurity  . Worried About Running Out of Food in the Last Year: Never true  . Ran Out of Food in the Last Year: Never true  Transportation Needs: No Transportation Needs  . Lack of Transportation (Medical): No  . Lack of Transportation (Non-Medical): No  Physical Activity: Insufficiently Active  . Days of Exercise per Week: 4 days  . Minutes of Exercise per Session: 30 min  Stress: No Stress Concern Present  . Feeling of Stress : Not at all  Social Connections: Moderately Isolated  . Frequency of Communication with Friends and Family: More than three times a week  . Frequency of Social Gatherings with Friends and Family: Never  . Attends Religious Services: Never  . Active Member of Clubs or Organizations: No  . Attends Club or Organization Meetings: Never  . Marital Status: Married  Intimate Partner Violence: Not At Risk  . Fear of Current or Ex-Partner: No  . Emotionally Abused: No  . Physically Abused: No  . Sexually Abused: No      Family History  Problem Relation Age of Onset  . Diabetes Other   . Breast cancer Sister   . Liver cancer Mother   . Uterine cancer Mother   . Hypertension Mother   . Colon cancer Maternal Grandmother   . Breast cancer Cousin        couple  . Kidney disease Sister   . Diabetes Sister     Vitals:   05/14/20 1134  BP: 133/87  Pulse: 85  SpO2: 92%  Weight: 70.9 kg (156 lb 6.4 oz)    PHYSICAL EXAM: General:  Elderly. Weak appearing. No respiratory difficulty HEENT: normal Neck: supple. no JVD. Carotids 2+ bilat; no bruits. No lymphadenopathy or thryomegaly appreciated. Cor: PMI   nondisplaced. Regular rate & rhythm. No rubs, gallops or murmurs. Lungs: clear Abdomen: soft, nontender, nondistended. No hepatosplenomegaly. No bruits or masses. Good bowel sounds. Extremities: no cyanosis, clubbing, rash, edema Neuro: alert & oriented x 3, cranial nerves  grossly intact. moves all 4 extremities w/o difficulty. Affect pleasant.  ECG: NSR 66 RAD. Nonspecific ST-T abnormalities Personally reviewed    ASSESSMENT & PLAN:  1. 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 Mod to severe TR. Severe septal flattening RVSP estimated at 108.  - CT abdomen 2/21: showed calcified granuloma in RLL - VQ scan 8/21: Normal - Suspect multifactorial - Will plan R/L HC and PFTs to assess whether she will be candidate for pulmonary vasodilators - Continue supplemental O2 - Need to restart CPAP (needs repeat sleep study) - Check auto-immune serology  2. Chronic hypoxic respiratory failure - quit tobacco 1995 - on home O2 at 3L - has h/o tracheostomy - check PFTs  3. OSA - need to restart CPAP  - will repeat sleep study as not done for many years   Glori Bickers, MD  12:02 PM

## 2020-05-14 ENCOUNTER — Other Ambulatory Visit: Payer: Self-pay

## 2020-05-14 ENCOUNTER — Other Ambulatory Visit (HOSPITAL_COMMUNITY): Payer: Self-pay

## 2020-05-14 ENCOUNTER — Ambulatory Visit (HOSPITAL_COMMUNITY)
Admission: RE | Admit: 2020-05-14 | Discharge: 2020-05-14 | Disposition: A | Discharge status: Home / Self Care | Payer: Medicare PPO | Source: Ambulatory Visit | Attending: Internal Medicine | Admitting: Internal Medicine

## 2020-05-14 VITALS — BP 133/87 | HR 85 | Wt 156.4 lb

## 2020-05-14 DIAGNOSIS — Z794 Long term (current) use of insulin: Secondary | ICD-10-CM | POA: Diagnosis not present

## 2020-05-14 DIAGNOSIS — I5032 Chronic diastolic (congestive) heart failure: Secondary | ICD-10-CM | POA: Diagnosis not present

## 2020-05-14 DIAGNOSIS — Z79899 Other long term (current) drug therapy: Secondary | ICD-10-CM | POA: Insufficient documentation

## 2020-05-14 DIAGNOSIS — G4733 Obstructive sleep apnea (adult) (pediatric): Secondary | ICD-10-CM | POA: Insufficient documentation

## 2020-05-14 DIAGNOSIS — I110 Hypertensive heart disease with heart failure: Secondary | ICD-10-CM | POA: Diagnosis not present

## 2020-05-14 DIAGNOSIS — Z87891 Personal history of nicotine dependence: Secondary | ICD-10-CM | POA: Insufficient documentation

## 2020-05-14 DIAGNOSIS — E119 Type 2 diabetes mellitus without complications: Secondary | ICD-10-CM | POA: Insufficient documentation

## 2020-05-14 DIAGNOSIS — I272 Pulmonary hypertension, unspecified: Secondary | ICD-10-CM | POA: Diagnosis not present

## 2020-05-14 DIAGNOSIS — Z7901 Long term (current) use of anticoagulants: Secondary | ICD-10-CM | POA: Insufficient documentation

## 2020-05-14 DIAGNOSIS — J9611 Chronic respiratory failure with hypoxia: Secondary | ICD-10-CM | POA: Diagnosis not present

## 2020-05-14 DIAGNOSIS — Z8249 Family history of ischemic heart disease and other diseases of the circulatory system: Secondary | ICD-10-CM | POA: Diagnosis not present

## 2020-05-14 DIAGNOSIS — Z9119 Patient's noncompliance with other medical treatment and regimen: Secondary | ICD-10-CM | POA: Diagnosis not present

## 2020-05-14 LAB — CBC
HCT: 48.3 % — ABNORMAL HIGH (ref 36.0–46.0)
Hemoglobin: 15.5 g/dL — ABNORMAL HIGH (ref 12.0–15.0)
MCH: 32.1 pg (ref 26.0–34.0)
MCHC: 32.1 g/dL (ref 30.0–36.0)
MCV: 100 fL (ref 80.0–100.0)
Platelets: 248 10*3/uL (ref 150–400)
RBC: 4.83 MIL/uL (ref 3.87–5.11)
RDW: 13.2 % (ref 11.5–15.5)
WBC: 8.8 10*3/uL (ref 4.0–10.5)
nRBC: 0 % (ref 0.0–0.2)

## 2020-05-14 LAB — BASIC METABOLIC PANEL
Anion gap: 13 (ref 5–15)
BUN: 29 mg/dL — ABNORMAL HIGH (ref 8–23)
CO2: 29 mmol/L (ref 22–32)
Calcium: 9.1 mg/dL (ref 8.9–10.3)
Chloride: 100 mmol/L (ref 98–111)
Creatinine, Ser: 1.36 mg/dL — ABNORMAL HIGH (ref 0.44–1.00)
GFR, Estimated: 40 mL/min — ABNORMAL LOW (ref >60)
Glucose, Bld: 114 mg/dL — ABNORMAL HIGH (ref 70–99)
Potassium: 4.4 mmol/L (ref 3.5–5.1)
Sodium: 142 mmol/L (ref 135–145)

## 2020-05-14 NOTE — Patient Instructions (Signed)
Labs done today, your results will be available in MyChart, we will contact you for abnormal readings.  Heart catherization see instructions below  Your physician has recommended that you have a pulmonary function test. Pulmonary Function Tests are a group of tests that measure how well air moves in and out of your lungs.  Your provider has recommended that you have a home sleep study.  This has to be approved by your insurance company. We will schedule you an appointment to pick up the equipment to give [place] time to complete this authorization. Once you have the equipment you will download the app on your phone and follow the instructions. YOUR PIN NUMBER IS: [id number]. Once you have completed the test the information is sent to the company through [employer] and you can dispose of the equipment. If your test is positive you will receive a call from Dr Theodosia Blender office [facility]) to set up your CPAP equipment.  Your physician recommends that you schedule a follow-up appointment in: 4 weeks  If you have any questions or concerns before your next appointment please send [place] a message through [place] or call our office at [phone].    TO LEAVE A MESSAGE FOR THE NURSE SELECT OPTION 2, PLEASE LEAVE A MESSAGE INCLUDING: . YOUR NAME . DATE OF BIRTH . CALL BACK NUMBER . REASON FOR CALL**this is important as we prioritize the call backs  [place] AS LONG AS YOU CALL BEFORE 4:00 PM   At the [facility], you and your health needs are our priority. As part of our continuing mission to provide you with exceptional heart care, we have created designated Provider Care Teams. These Care Teams include your primary Cardiologist (physician) and Advanced Practice Providers (APPs- Physician Assistants and Nurse Practitioners) who all work together to provide you with the care you need, when you need it.   You may see any of the following providers on  your designated Care Team at your next follow up: Marland Kitchen Dr Glori Bickers . Dr Loralie Champagne . Darrick Grinder, NP . Lyda Jester, PA . Audry Riles, PharmD   Please be sure to bring in all your medications bottles to every appointment.     Catheterization instructions  You are scheduled for a Cardiac Catheterization on Monday, November 8 with Dr. Glori Bickers.  1. Please arrive at the [facility] (Main Entrance A) at [facility]: [address] [place], [place] [address] at 8:00 AM (This time is two hours before your procedure to ensure your preparation). Free valet parking service is available.   Special note: Every effort is made to have your procedure done on time. Please understand that emergencies sometimes delay scheduled procedures.  2. Diet: Do not eat solid foods after midnight.  The patient may have clear liquids until 5am upon the day of the procedure.  3.Covid Test  Friday  November 5,2021_0 :[address]  4. Medication instructions in preparation for your procedure:  Do not take Xarelto  Sunday 05/23/2020 or Monday  05/24/2020  Monday 05/24/2020  Do not take your Lasix,Insulin or Spironolactone   On the morning of your procedure, take any morning medicines NOT listed above.  You may use sips of water.  5. Plan for one night stay--bring personal belongings. 6. Bring a current list of your medications and current insurance cards. 7. You MUST have a responsible person to drive you home.  8. Someone MUST be with you the first 24 hours after you arrive home or your discharge will be delayed. 9. Please wear clothes that are easy to get on and off and wear slip-on shoes.  Thank you for allowing [place] to care for you!   -- [place]

## 2020-05-15 LAB — CYCLIC CITRUL PEPTIDE ANTIBODY, IGG/IGA: CCP Antibodies IgG/IgA: 5 units (ref 0–19)

## 2020-05-15 LAB — RHEUMATOID FACTOR: Rheumatoid fact SerPl-aCnc: <10 IU/mL (ref 0.0–13.9)

## 2020-05-16 LAB — ANTI-SCLERODERMA ANTIBODY: Scleroderma (Scl-70) (ENA) Antibody, IgG: <0.2 AI (ref 0.0–0.9)

## 2020-05-17 ENCOUNTER — Telehealth (HOSPITAL_COMMUNITY): Payer: Self-pay | Admitting: *Deleted

## 2020-05-17 LAB — ANCA TITERS
Atypical P-ANCA titer: <1:20 {titer}
C-ANCA: <1:20 {titer}
P-ANCA: <1:20 {titer}

## 2020-05-17 NOTE — Telephone Encounter (Signed)
Pt advised.

## 2020-05-17 NOTE — Telephone Encounter (Signed)
Pt left VM stating she has a cath on 11/8 and wants to know when she will be able to resume physical therapy.  Routed to [place]

## 2020-05-17 NOTE — Telephone Encounter (Signed)
She is ok to resume now

## 2020-05-18 ENCOUNTER — Encounter: Payer: Self-pay | Admitting: Physical Therapy

## 2020-05-18 ENCOUNTER — Other Ambulatory Visit: Payer: Self-pay

## 2020-05-18 ENCOUNTER — Ambulatory Visit: Payer: Medicare PPO | Attending: Family Medicine | Admitting: Physical Therapy

## 2020-05-18 DIAGNOSIS — M62830 Muscle spasm of back: Secondary | ICD-10-CM | POA: Insufficient documentation

## 2020-05-18 DIAGNOSIS — R262 Difficulty in walking, not elsewhere classified: Secondary | ICD-10-CM | POA: Diagnosis present

## 2020-05-18 DIAGNOSIS — R2689 Other abnormalities of gait and mobility: Secondary | ICD-10-CM | POA: Diagnosis present

## 2020-05-18 DIAGNOSIS — R5381 Other malaise: Secondary | ICD-10-CM | POA: Diagnosis present

## 2020-05-18 DIAGNOSIS — M6281 Muscle weakness (generalized): Secondary | ICD-10-CM

## 2020-05-18 DIAGNOSIS — Z9181 History of falling: Secondary | ICD-10-CM | POA: Diagnosis present

## 2020-05-18 DIAGNOSIS — M545 Low back pain, unspecified: Secondary | ICD-10-CM | POA: Insufficient documentation

## 2020-05-18 NOTE — Therapy (Signed)
[address] [place], [name], [address] Phone: [phone]   Fax:  [phone]  Physical Therapy Treatment  Patient Details  Name: Amanda Short MRN: [id number] Date of Birth: 1940/12/29 Referring Provider (PT): Rennard   Encounter Date: 05/18/2020   PT End of Session - 05/18/20 1607    Visit Number 37    Number of Visits 46    Date for PT Re-Evaluation 06/18/20    Authorization Type [employer]    PT Start Time 1523    PT Stop Time 1609    PT Time Calculation (min) 46 min    Activity Tolerance Patient tolerated treatment well    Behavior During Therapy [facility] for tasks assessed/performed           Past Medical History:  Diagnosis Date  . Abdominal pain   . Abnormal CT of the abdomen   . Acute blood loss anemia   . Acute cystitis with hematuria 12/19/2019  . Acute encephalopathy 06/06/2017  . Acute pulmonary embolism ([place]) 04/01/2013  . Acute respiratory failure with hypoxia ([place])   . Anxiety state   . Arthritis    "back, hands" (07/22/2015)  . Aspiration pneumonia ([place])   . Benign essential HTN   . Cancer of left breast ([place]) 2006   S/P lumpectomy  . Cellulitis of foot, right 07/21/2015  . Cellulitis of right foot 07/21/2015  . Central retinal vein occlusion of right eye 04/20/2016  . Chronic diastolic CHF (congestive heart failure) ([place]) 07/10/2017  . Colitis 05/27/2017  . Complication of anesthesia    "brief breathing problem at surgery center in ~ 2005 when I had gallbladder OR"  . Concussion 03/2013   due to fall  . Depression   . Diabetes ([place]) 04/01/2013  . 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 Diabetes mellitus type 2, controlled ([place]) 07/21/2015  . Diabetic foot infection ([place]) 07/21/2015  . Diverticulitis of colon   . Diverticulitis of large intestine without perforation or abscess without bleeding   . Diverticulosis   . DVT (deep venous thrombosis) ([place]) 03/2013   LLE  . Encephalopathy    . Encounter for attention to tracheostomy ([place])   . Encounter for central line placement   . Encounter for orogastric (OG) tube placement   . Essential hypertension, benign 04/01/2013  . GERD (gastroesophageal reflux disease)   . History of acute respiratory failure   . History of pulmonary embolism   . HX: breast cancer 04/01/2013  . Hyperlipidemia   . Hypertension   . Hypocalcemia 05/27/2017  . Hypokalemia 07/10/2017  . Hypomagnesemia 07/11/2017  . Hypothyroidism   . Nausea vomiting and diarrhea 07/10/2017  . Non-intractable cyclical vomiting with nausea   . Nuclear sclerotic cataract of both eyes 04/20/2016  . OSA (obstructive sleep apnea) 07/10/2017  . OSA on CPAP 04/01/2013  . OSA treated with BiPAP   . Physical debility 06/23/2017  . 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 Pulmonary embolism ([place]) 03/2013  . Pulmonary HTN ([place]) 07/05/2017  . Seizure ([place]) 07/10/2017  . Seizure disorder ([place]) 07/05/2017  . Seizures ([place])   . Sepsis ([place]) 07/10/2017  . Sleep apnea   . Status epilepticus ([place])   . 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 Tachypnea   . Thyroid disease    Hypothyroid  . Tracheostomy, acute management ([place])   . Type II diabetes mellitus ([place])     Past Surgical History:  Procedure Laterality Date  . ABDOMINAL  HYSTERECTOMY  1970s  . BREAST BIOPSY Left 2006  . BREAST LUMPECTOMY Left 2006  . BUNIONECTOMY WITH HAMMERTOE RECONSTRUCTION Left   . ESOPHAGOGASTRODUODENOSCOPY N/A 05/29/2017   Procedure: ESOPHAGOGASTRODUODENOSCOPY (EGD);  Surgeon: Irene Shipper, MD;  Location: [facility];  Service: Endoscopy;  Laterality: N/A;  . JOINT REPLACEMENT    . LAPAROSCOPIC CHOLECYSTECTOMY  ~ 2005  . TOTAL HIP ARTHROPLASTY Left 2010  . TOTAL KNEE ARTHROPLASTY Bilateral 2005-2006    There were no vitals filed for this visit.   Subjective Assessment - 05/18/20 1536    Subjective saw the cardiologist last week, she will have a thorough heart catheterization on Monday.  Her  pulmonary hypertension was "100 and normal is 35-40"    Currently in Pain? No/denies                             [facility] Adult PT Treatment/Exercise - 05/18/20 0001      Ambulation/Gait   Gait Comments gait 120 feet with HHA      High Level Balance   High Level Balance Comments one foot on two inch step with ball toss and reaching, this was difficult for her, on airex red tband scap stabilization, on airex head turns      Lumbar Exercises: Aerobic   Nustep level 5 x 7 minutes      Lumbar Exercises: Machines for Strengthening   Cybex Knee Extension 10# 2x15    Cybex Knee Flexion 25# 2x15    Other Lumbar Machine Exercise 20# seated rows and lats      Lumbar Exercises: Supine   Bridge with [employer] 10 reps;1 second    Bridge with [employer] Limitations very small raises                    PT Short Term Goals - 12/04/19 1627      PT SHORT TERM GOAL #1   Title independent with intial HEP    Status Achieved             PT Long Term Goals - 05/12/20 1616      PT LONG TERM GOAL #1   Title increase overall hip LE strength for functional gait and safety hips and ankles to 4+/5    Status Partially Met      PT LONG TERM GOAL #2   Title decrease TUG time to less than 14 seconds for functional gait and safety    Status Partially Met                 Plan - 05/18/20 1610    Clinical Impression Statement Patient will have a heart catheterization.  She is with O2 all the time now in PT and most of the time at home, her oxygen levcels are staying above 90%, she does have significant balance issues and really struggles to function.   We are working on these and will need to really assure that she is okay after the hear cath    PT Next Visit Plan will work on strength, endurance and balance    Consulted and Agree with Plan of Care Patient           Patient will benefit from skilled therapeutic intervention in order to improve the following  deficits and impairments:  Abnormal gait, Decreased range of motion, Difficulty walking, Increased muscle spasms, Decreased activity tolerance, Pain, Decreased balance, Impaired flexibility, Improper body mechanics, Postural dysfunction,  Decreased strength, Decreased mobility  Visit Diagnosis: Muscle weakness (generalized)  Risk for falls  Difficulty in walking, not elsewhere classified  Physical debility  Acute bilateral low back pain without sciatica  Other abnormalities of gait and mobility  Muscle spasm of back     Problem List Patient Active Problem List   Diagnosis Date Noted  . Hypoxia 03/23/2020  . Acute cystitis with hematuria 12/19/2019  . Hypomagnesemia 07/11/2017  . Nausea vomiting and diarrhea 07/10/2017  . Hypokalemia 07/10/2017  . Chronic diastolic CHF (congestive heart failure) ([place]) 07/10/2017  . Seizure ([place]) 07/10/2017  . OSA (obstructive sleep apnea) 07/10/2017  . Sepsis ([place]) 07/10/2017  . Seizure disorder ([place]) 07/05/2017  . Pulmonary HTN ([place]) 07/05/2017  . Anxiety state   . Physical debility 06/23/2017  . 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 History of acute respiratory failure   . Tracheostomy, acute management ([place])   . Aspiration pneumonia ([place])   . Encephalopathy   . Status epilepticus ([place])   . Benign essential HTN   . 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 History of pulmonary embolism   . Acute blood loss anemia   . Diverticulitis of colon   . Tachypnea   . Encounter for orogastric (OG) tube placement   . Encounter for central line placement   . Encounter for attention to tracheostomy ([place])   . Acute respiratory failure with hypoxia ([place])   . Acute encephalopathy 06/06/2017  . Diverticulitis of large intestine without perforation or abscess without bleeding   . Abdominal pain   . Non-intractable cyclical vomiting with nausea   . Abnormal CT of the abdomen   . Hypocalcemia 05/27/2017  . Prolonged QT interval 05/27/2017   . Colitis 05/27/2017  . Central retinal vein occlusion of right eye 04/20/2016  . Nuclear sclerotic cataract of both eyes 04/20/2016  . Diabetic foot infection ([place]) 07/21/2015  . Diabetes mellitus type 2, controlled ([place]) 07/21/2015  . Cellulitis of foot, right 07/21/2015  . Cellulitis of right foot 07/21/2015  . Acute pulmonary embolism ([place]) 04/01/2013  . DVT (deep venous thrombosis) ([place]) 04/01/2013  . HX: breast cancer 04/01/2013  . Concussion 04/01/2013  . Diabetes ([place]) 04/01/2013  . Essential hypertension, benign 04/01/2013  . Hypothyroidism 04/01/2013  . OSA on CPAP 04/01/2013    [name] Boast., PT 05/18/2020, 4:15 PM  [address]. [place], [name], [address] Phone: [phone]   Fax:  [phone]  Name: Shantelle Alles MRN: [id number] Date of Birth: 09-Sep-1940

## 2020-05-19 ENCOUNTER — Telehealth (HOSPITAL_COMMUNITY): Payer: Self-pay | Admitting: *Deleted

## 2020-05-19 NOTE — Telephone Encounter (Signed)
auth for r/l heart cath in clinical review case #[id number]

## 2020-05-20 ENCOUNTER — Ambulatory Visit: Payer: Medicare PPO | Admitting: Physical Therapy

## 2020-05-20 ENCOUNTER — Other Ambulatory Visit: Payer: Self-pay

## 2020-05-20 ENCOUNTER — Encounter: Payer: Self-pay | Admitting: Physical Therapy

## 2020-05-20 DIAGNOSIS — M545 Low back pain, unspecified: Secondary | ICD-10-CM

## 2020-05-20 DIAGNOSIS — M62830 Muscle spasm of back: Secondary | ICD-10-CM

## 2020-05-20 DIAGNOSIS — Z9181 History of falling: Secondary | ICD-10-CM

## 2020-05-20 DIAGNOSIS — R2689 Other abnormalities of gait and mobility: Secondary | ICD-10-CM

## 2020-05-20 DIAGNOSIS — M6281 Muscle weakness (generalized): Secondary | ICD-10-CM | POA: Diagnosis not present

## 2020-05-20 DIAGNOSIS — R262 Difficulty in walking, not elsewhere classified: Secondary | ICD-10-CM

## 2020-05-20 DIAGNOSIS — R5381 Other malaise: Secondary | ICD-10-CM

## 2020-05-20 NOTE — Therapy (Signed)
[facility]. [place], [name], [address] Phone: [phone]   Fax:  [phone]  Physical Therapy Treatment  Patient Details  Name: Amanda Short MRN: [id number] Date of Birth: 1941/07/06 Referring Provider (PT): Rennard   Encounter Date: 05/20/2020   PT End of Session - 05/20/20 1614    Visit Number 38    Number of Visits 46    Date for PT Re-Evaluation 06/18/20    Authorization Type [employer]    PT Start Time 1525    PT Stop Time 1611    PT Time Calculation (min) 46 min    Activity Tolerance Patient tolerated treatment well    Behavior During Therapy [facility] for tasks assessed/performed           Past Medical History:  Diagnosis Date  . Abdominal pain   . Abnormal CT of the abdomen   . Acute blood loss anemia   . Acute cystitis with hematuria 12/19/2019  . Acute encephalopathy 06/06/2017  . Acute pulmonary embolism ([place]) 04/01/2013  . Acute respiratory failure with hypoxia ([place])   . Anxiety state   . Arthritis    "back, hands" (07/22/2015)  . Aspiration pneumonia ([place])   . Benign essential HTN   . Cancer of left breast ([place]) 2006   S/P lumpectomy  . Cellulitis of foot, right 07/21/2015  . Cellulitis of right foot 07/21/2015  . Central retinal vein occlusion of right eye 04/20/2016  . Chronic diastolic CHF (congestive heart failure) ([place]) 07/10/2017  . Colitis 05/27/2017  . Complication of anesthesia    "brief breathing problem at surgery center in ~ 2005 when I had gallbladder OR"  . Concussion 03/2013   due to fall  . Depression   . Diabetes ([place]) 04/01/2013  . 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 Diabetes mellitus type 2, controlled ([place]) 07/21/2015  . Diabetic foot infection ([place]) 07/21/2015  . Diverticulitis of colon   . Diverticulitis of large intestine without perforation or abscess without bleeding   . Diverticulosis   . DVT (deep venous thrombosis) ([place]) 03/2013   LLE  . Encephalopathy    . Encounter for attention to tracheostomy ([place])   . Encounter for central line placement   . Encounter for orogastric (OG) tube placement   . Essential hypertension, benign 04/01/2013  . GERD (gastroesophageal reflux disease)   . History of acute respiratory failure   . History of pulmonary embolism   . HX: breast cancer 04/01/2013  . Hyperlipidemia   . Hypertension   . Hypocalcemia 05/27/2017  . Hypokalemia 07/10/2017  . Hypomagnesemia 07/11/2017  . Hypothyroidism   . Nausea vomiting and diarrhea 07/10/2017  . Non-intractable cyclical vomiting with nausea   . Nuclear sclerotic cataract of both eyes 04/20/2016  . OSA (obstructive sleep apnea) 07/10/2017  . OSA on CPAP 04/01/2013  . OSA treated with BiPAP   . Physical debility 06/23/2017  . 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 Pulmonary embolism ([place]) 03/2013  . Pulmonary HTN ([place]) 07/05/2017  . Seizure ([place]) 07/10/2017  . Seizure disorder ([place]) 07/05/2017  . Seizures ([place])   . Sepsis ([place]) 07/10/2017  . Sleep apnea   . Status epilepticus ([place])   . 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 Tachypnea   . Thyroid disease    Hypothyroid  . Tracheostomy, acute management ([place])   . Type II diabetes mellitus ([place])     Past Surgical History:  Procedure Laterality Date  . ABDOMINAL  HYSTERECTOMY  1970s  . BREAST BIOPSY Left 2006  . BREAST LUMPECTOMY Left 2006  . BUNIONECTOMY WITH HAMMERTOE RECONSTRUCTION Left   . ESOPHAGOGASTRODUODENOSCOPY N/A 05/29/2017   Procedure: ESOPHAGOGASTRODUODENOSCOPY (EGD);  Surgeon: Irene Shipper, MD;  Location: [facility];  Service: Endoscopy;  Laterality: N/A;  . JOINT REPLACEMENT    . LAPAROSCOPIC CHOLECYSTECTOMY  ~ 2005  . TOTAL HIP ARTHROPLASTY Left 2010  . TOTAL KNEE ARTHROPLASTY Bilateral 2005-2006    There were no vitals filed for this visit.   Subjective Assessment - 05/20/20 1528    Subjective Is having catheterization Monday, she is reporting some fatigue today    Currently in Pain?  No/denies              [facility] PT Assessment - 05/20/20 0001      Timed Up and Go Test   Normal TUG (seconds) 15                         [facility] Adult PT Treatment/Exercise - 05/20/20 0001      High Level Balance   High Level Balance Comments one foot on two inch step with ball toss and reaching, this was difficult for her, on airex red tband scap stabilization, on airex head turns, in parallel bars foam balance beam      Lumbar Exercises: Aerobic   Nustep level 5 x 6 minutes      Lumbar Exercises: Machines for Strengthening   Cybex Knee Extension 10# 2x15    Cybex Knee Flexion 25# 2x15    Other Lumbar Machine Exercise standing on airex 5# extension and rows with arms      Lumbar Exercises: Standing   Other Standing Lumbar Exercises 3# marching, extension and hip abduction with walker 2x10 each while standing on airex                    PT Short Term Goals - 12/04/19 1627      PT SHORT TERM GOAL #1   Title independent with intial HEP    Status Achieved             PT Long Term Goals - 05/20/20 1622      PT LONG TERM GOAL #1   Title increase overall hip LE strength for functional gait and safety hips and ankles to 4+/5    Status Partially Met      PT LONG TERM GOAL #2   Title decrease TUG time to less than 14 seconds for functional gait and safety    Status Partially Met                 Plan - 05/20/20 1620    Clinical Impression Statement Having heart catheterization on Monday, she had some issues with balance today, I think holding her breath and concentrating, her O2 saturation dropped to 83% which I have not seen since she started her oxygen    PT Next Visit Plan see how she does after the catheterization    Consulted and Agree with Plan of Care Patient           Patient will benefit from skilled therapeutic intervention in order to improve the following deficits and impairments:  Abnormal gait, Decreased range of motion,  Difficulty walking, Increased muscle spasms, Decreased activity tolerance, Pain, Decreased balance, Impaired flexibility, Improper body mechanics, Postural dysfunction, Decreased strength, Decreased mobility  Visit Diagnosis: Muscle weakness (generalized)  Risk for falls  Difficulty  in walking, not elsewhere classified  Physical debility  Acute bilateral low back pain without sciatica  Other abnormalities of gait and mobility  Muscle spasm of back     Problem List Patient Active Problem List   Diagnosis Date Noted  . Hypoxia 03/23/2020  . Acute cystitis with hematuria 12/19/2019  . Hypomagnesemia 07/11/2017  . Nausea vomiting and diarrhea 07/10/2017  . Hypokalemia 07/10/2017  . Chronic diastolic CHF (congestive heart failure) ([place]) 07/10/2017  . Seizure ([place]) 07/10/2017  . OSA (obstructive sleep apnea) 07/10/2017  . Sepsis ([place]) 07/10/2017  . Seizure disorder ([place]) 07/05/2017  . Pulmonary HTN ([place]) 07/05/2017  . Anxiety state   . Physical debility 06/23/2017  . 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 History of acute respiratory failure   . Tracheostomy, acute management ([place])   . Aspiration pneumonia ([place])   . Encephalopathy   . Status epilepticus ([place])   . Benign essential HTN   . 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 History of pulmonary embolism   . Acute blood loss anemia   . Diverticulitis of colon   . Tachypnea   . Encounter for orogastric (OG) tube placement   . Encounter for central line placement   . Encounter for attention to tracheostomy ([place])   . Acute respiratory failure with hypoxia ([place])   . Acute encephalopathy 06/06/2017  . Diverticulitis of large intestine without perforation or abscess without bleeding   . Abdominal pain   . Non-intractable cyclical vomiting with nausea   . Abnormal CT of the abdomen   . Hypocalcemia 05/27/2017  . 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 Colitis 05/27/2017  . Central retinal vein occlusion of right  eye 04/20/2016  . Nuclear sclerotic cataract of both eyes 04/20/2016  . Diabetic foot infection ([place]) 07/21/2015  . Diabetes mellitus type 2, controlled ([place]) 07/21/2015  . Cellulitis of foot, right 07/21/2015  . Cellulitis of right foot 07/21/2015  . Acute pulmonary embolism ([place]) 04/01/2013  . DVT (deep venous thrombosis) ([place]) 04/01/2013  . HX: breast cancer 04/01/2013  . Concussion 04/01/2013  . Diabetes ([place]) 04/01/2013  . Essential hypertension, benign 04/01/2013  . Hypothyroidism 04/01/2013  . OSA on CPAP 04/01/2013    [name] Boast., PT 05/20/2020, 4:23 PM  [place]. [place], [name], [address] Phone: [phone]   Fax:  [phone]  Name: Terrilee Dudzik MRN: [id number] Date of Birth: Aug 12, 1940

## 2020-05-21 ENCOUNTER — Other Ambulatory Visit (HOSPITAL_COMMUNITY)
Admission: RE | Admit: 2020-05-21 | Discharge: 2020-05-21 | Disposition: A | Discharge status: Home / Self Care | Payer: Medicare PPO | Source: Ambulatory Visit | Attending: Internal Medicine | Admitting: Internal Medicine

## 2020-05-21 DIAGNOSIS — Z01812 Encounter for preprocedural laboratory examination: Secondary | ICD-10-CM | POA: Diagnosis present

## 2020-05-21 DIAGNOSIS — Z20822 Contact with and (suspected) exposure to covid-19: Secondary | ICD-10-CM | POA: Diagnosis not present

## 2020-05-21 LAB — SARS CORONAVIRUS 2 (TAT 6-24 HRS): SARS Coronavirus 2: NEGATIVE

## 2020-05-24 ENCOUNTER — Other Ambulatory Visit: Payer: Self-pay

## 2020-05-24 ENCOUNTER — Ambulatory Visit (HOSPITAL_COMMUNITY)
Admission: RE | Admit: 2020-05-24 | Discharge: 2020-05-24 | Disposition: A | Discharge status: Home / Self Care | Payer: Medicare PPO | Attending: Internal Medicine | Admitting: Internal Medicine

## 2020-05-24 ENCOUNTER — Encounter (HOSPITAL_COMMUNITY)
Admission: RE | Disposition: A | Discharge status: Home / Self Care | Payer: Self-pay | Source: Home / Self Care | Attending: Internal Medicine

## 2020-05-24 DIAGNOSIS — E119 Type 2 diabetes mellitus without complications: Secondary | ICD-10-CM | POA: Insufficient documentation

## 2020-05-24 DIAGNOSIS — G4733 Obstructive sleep apnea (adult) (pediatric): Secondary | ICD-10-CM | POA: Insufficient documentation

## 2020-05-24 DIAGNOSIS — Z87891 Personal history of nicotine dependence: Secondary | ICD-10-CM | POA: Diagnosis not present

## 2020-05-24 DIAGNOSIS — I272 Pulmonary hypertension, unspecified: Secondary | ICD-10-CM

## 2020-05-24 DIAGNOSIS — J9611 Chronic respiratory failure with hypoxia: Secondary | ICD-10-CM | POA: Diagnosis not present

## 2020-05-24 DIAGNOSIS — I2721 Secondary pulmonary arterial hypertension: Secondary | ICD-10-CM | POA: Diagnosis not present

## 2020-05-24 DIAGNOSIS — I2510 Atherosclerotic heart disease of native coronary artery without angina pectoris: Secondary | ICD-10-CM | POA: Diagnosis not present

## 2020-05-24 DIAGNOSIS — I2781 Cor pulmonale (chronic): Secondary | ICD-10-CM | POA: Diagnosis not present

## 2020-05-24 DIAGNOSIS — I10 Essential (primary) hypertension: Secondary | ICD-10-CM | POA: Diagnosis not present

## 2020-05-24 HISTORY — PX: RIGHT/LEFT HEART CATH AND CORONARY ANGIOGRAPHY: CATH118266

## 2020-05-24 LAB — POCT I-STAT 7, (LYTES, BLD GAS, ICA,H+H)
Acid-Base Excess: 5 mmol/L — ABNORMAL HIGH (ref 0.0–2.0)
Bicarbonate: 32 mmol/L — ABNORMAL HIGH (ref 20.0–28.0)
Calcium, Ion: 1.13 mmol/L — ABNORMAL LOW (ref 1.15–1.40)
HCT: 41 % (ref 36.0–46.0)
Hemoglobin: 13.9 g/dL (ref 12.0–15.0)
O2 Saturation: 96 %
Potassium: 4.3 mmol/L (ref 3.5–5.1)
Sodium: 146 mmol/L — ABNORMAL HIGH (ref 135–145)
TCO2: 34 mmol/L — ABNORMAL HIGH (ref 22–32)
pCO2 arterial: 56.5 mmHg — ABNORMAL HIGH (ref 32.0–48.0)
pH, Arterial: 7.361 (ref 7.350–7.450)
pO2, Arterial: 85 mmHg (ref 83.0–108.0)

## 2020-05-24 LAB — GLUCOSE, CAPILLARY
Glucose-Capillary: 102 mg/dL — ABNORMAL HIGH (ref 70–99)
Glucose-Capillary: 90 mg/dL (ref 70–99)

## 2020-05-24 LAB — POCT I-STAT EG7
Acid-Base Excess: 6 mmol/L — ABNORMAL HIGH (ref 0.0–2.0)
Bicarbonate: 35.1 mmol/L — ABNORMAL HIGH (ref 20.0–28.0)
Calcium, Ion: 1.29 mmol/L (ref 1.15–1.40)
HCT: 42 % (ref 36.0–46.0)
Hemoglobin: 14.3 g/dL (ref 12.0–15.0)
O2 Saturation: 58 %
Potassium: 4.4 mmol/L (ref 3.5–5.1)
Sodium: 143 mmol/L (ref 135–145)
TCO2: 37 mmol/L — ABNORMAL HIGH (ref 22–32)
pCO2, Ven: 67.8 mmHg — ABNORMAL HIGH (ref 44.0–60.0)
pH, Ven: 7.322 (ref 7.250–7.430)
pO2, Ven: 34 mmHg (ref 32.0–45.0)

## 2020-05-24 SURGERY — RIGHT/LEFT HEART CATH AND CORONARY ANGIOGRAPHY
Anesthesia: LOCAL

## 2020-05-24 MED ORDER — MIDAZOLAM HCL 2 MG/2ML IJ SOLN
INTRAMUSCULAR | Status: DC | PRN
  Administered 2020-05-24: 1 mg via INTRAVENOUS

## 2020-05-24 MED ORDER — HEPARIN SODIUM (PORCINE) 1000 UNIT/ML IJ SOLN
INTRAMUSCULAR | Status: AC
  Filled 2020-05-24: qty 1

## 2020-05-24 MED ORDER — LABETALOL HCL 5 MG/ML IV SOLN: 10.0000 mg | INTRAVENOUS | Status: DC | PRN

## 2020-05-24 MED ORDER — ASPIRIN 81 MG PO CHEW: 81.0000 mg | CHEWABLE_TABLET | ORAL | Status: DC

## 2020-05-24 MED ORDER — SODIUM CHLORIDE 0.9 % IV SOLN: INTRAVENOUS | Status: DC

## 2020-05-24 MED ORDER — MIDAZOLAM HCL 2 MG/2ML IJ SOLN
INTRAMUSCULAR | Status: AC
  Filled 2020-05-24: qty 2

## 2020-05-24 MED ORDER — HEPARIN (PORCINE) IN NACL 1000-0.9 UT/500ML-% IV SOLN
INTRAVENOUS | Status: DC | PRN
  Administered 2020-05-24: 500 mL

## 2020-05-24 MED ORDER — HEPARIN (PORCINE) IN NACL 1000-0.9 UT/500ML-% IV SOLN
INTRAVENOUS | Status: AC
  Filled 2020-05-24: qty 1000

## 2020-05-24 MED ORDER — HEPARIN SODIUM (PORCINE) 1000 UNIT/ML IJ SOLN
INTRAMUSCULAR | Status: DC | PRN
  Administered 2020-05-24: 3500 [IU] via INTRAVENOUS

## 2020-05-24 MED ORDER — SODIUM CHLORIDE 0.9% FLUSH: 3.0000 mL | Freq: Two times a day (BID) | INTRAVENOUS | Status: DC

## 2020-05-24 MED ORDER — VERAPAMIL HCL 2.5 MG/ML IV SOLN
INTRAVENOUS | Status: DC | PRN
  Administered 2020-05-24: 10 mL via INTRA_ARTERIAL

## 2020-05-24 MED ORDER — VERAPAMIL HCL 2.5 MG/ML IV SOLN
INTRAVENOUS | Status: AC
  Filled 2020-05-24: qty 2

## 2020-05-24 MED ORDER — SODIUM CHLORIDE 0.9% FLUSH: 3.0000 mL | INTRAVENOUS | Status: DC | PRN

## 2020-05-24 MED ORDER — LIDOCAINE HCL (PF) 1 % IJ SOLN
INTRAMUSCULAR | Status: DC | PRN
  Administered 2020-05-24: 1 mL
  Administered 2020-05-24: 3 mL

## 2020-05-24 MED ORDER — SODIUM CHLORIDE 0.9 % IV SOLN: 250.0000 mL | INTRAVENOUS | Status: DC | PRN

## 2020-05-24 MED ORDER — HYDRALAZINE HCL 20 MG/ML IJ SOLN: 10.0000 mg | INTRAMUSCULAR | Status: DC | PRN

## 2020-05-24 MED ORDER — IOHEXOL 350 MG/ML SOLN
INTRAVENOUS | Status: DC | PRN
  Administered 2020-05-24: 25 mL

## 2020-05-24 MED ORDER — ACETAMINOPHEN 325 MG PO TABS: 650.0000 mg | ORAL_TABLET | ORAL | Status: DC | PRN

## 2020-05-24 MED ORDER — FENTANYL CITRATE (PF) 100 MCG/2ML IJ SOLN
INTRAMUSCULAR | Status: AC
  Filled 2020-05-24: qty 2

## 2020-05-24 MED ORDER — ONDANSETRON HCL 4 MG/2ML IJ SOLN: 4.0000 mg | Freq: Four times a day (QID) | INTRAMUSCULAR | Status: DC | PRN

## 2020-05-24 MED ORDER — FENTANYL CITRATE (PF) 100 MCG/2ML IJ SOLN
INTRAMUSCULAR | Status: DC | PRN
  Administered 2020-05-24: 25 ug via INTRAVENOUS

## 2020-05-24 MED ORDER — LIDOCAINE HCL (PF) 1 % IJ SOLN
INTRAMUSCULAR | Status: AC
  Filled 2020-05-24: qty 30

## 2020-05-24 SURGICAL SUPPLY — 12 items
CATH 5FR JL3.5 JR4 ANG PIG MP (CATHETERS) ×2 IMPLANT
CATH SWAN GANZ 7F STRAIGHT (CATHETERS) ×2 IMPLANT
DEVICE RAD COMP TR BAND LRG (VASCULAR PRODUCTS) ×2 IMPLANT
GLIDESHEATH SLEND SS 6F .021 (SHEATH) ×2 IMPLANT
GLIDESHEATH SLENDER 7FR .021G (SHEATH) ×2 IMPLANT
GUIDEWIRE .025 260CM (WIRE) ×2 IMPLANT
GUIDEWIRE INQWIRE 1.5J.035X260 (WIRE) ×1 IMPLANT
INQWIRE 1.5J .035X260CM (WIRE) ×2
PACK CARDIAC CATHETERIZATION (CUSTOM PROCEDURE TRAY) ×2 IMPLANT
TRANSDUCER W/STOPCOCK (MISCELLANEOUS) ×2 IMPLANT
TUBING ART PRESS 72  MALE/FEM (TUBING) ×1
TUBING ART PRESS 72 MALE/FEM (TUBING) ×1 IMPLANT

## 2020-05-24 NOTE — Interval H&P Note (Signed)
History and Physical Interval Note:  05/24/2020 1:18 PM  Amanda Short  has presented today for surgery, with the diagnosis of pulmonary hypertension.  The various methods of treatment have been discussed with the patient and family. After consideration of risks, benefits and other options for treatment, the patient has consented to  Procedure(s): RIGHT/LEFT HEART CATH AND CORONARY ANGIOGRAPHY (N/A) and possible coronary angioplasty as a surgical intervention.  The patient's history has been reviewed, patient examined, no change in status, stable for surgery.  I have reviewed the patient's chart and labs.  Questions were answered to the patient's satisfaction.     Marshel Golubski

## 2020-05-24 NOTE — Discharge Instructions (Signed)
DRINK PLENTY OF FLUIDS FOR THE NEXT 2-3 DAYS.  KEEP ARM ELEVATED THE REMAINDER OF THE DAY.  Radial Site Care  This sheet gives you information about how to care for yourself after your procedure. Your health care provider may also give you more specific instructions. If you have problems or questions, contact your health care provider. What can I expect after the procedure? After the procedure, it is common to have:  Bruising and tenderness at the catheter insertion area. Follow these instructions at home: Medicines  Take over-the-counter and prescription medicines only as told by your health care provider. Insertion site care 1. Follow instructions from your health care provider about how to take care of your insertion site. Make sure you: ? Wash your hands with soap and water before you change your bandage (dressing). If soap and water are not available, use hand sanitizer. ? Change your dressing as told by your health care provider. 2. Check your insertion site every day for signs of infection. Check for: ? Redness, swelling, or pain. ? Fluid or blood. ? Pus or a bad smell. ? Warmth. 3. Do not take baths, swim, or use a hot tub for 5 days. 4. You may shower 24-48 hours after the procedure. ? Remove the dressing and gently wash the site with plain soap and water. ? Pat the area dry with a clean towel. ? Do not rub the site. That could cause bleeding. 5. Do not apply powder or lotion to the site. Activity  1. For 24 hours after the procedure, or as directed by your health care provider: ? Do not flex or bend the affected arm. ? Do not push or pull heavy objects with the affected arm. ? Do not drive yourself home from the hospital or clinic. You may drive 24 hours after the procedure. ? Do not operate machinery or power tools. 2. Do not push, pull or lift anything that is heavier than 10 lb for 5 days. 3. Ask your health care provider when it is okay to: ? Return to work or  school. ? Resume usual physical activities or sports. ? Resume sexual activity. General instructions  If the catheter site starts to bleed, raise your arm and put firm pressure on the site. If the bleeding does not stop, get help right away. This is a medical emergency.  If you went home on the same day as your procedure, a responsible adult should be with you for the first 24 hours after you arrive home.  Keep all follow-up visits as told by your health care provider. This is important. Contact a health care provider if:  You have a fever.  You have redness, swelling, or yellow drainage around your insertion site. Get help right away if:  You have unusual pain at the radial site.  The catheter insertion area swells very fast.  The insertion area is bleeding, and the bleeding does not stop when you hold steady pressure on the area.  Your arm or hand becomes pale, cool, tingly, or numb. These symptoms may represent a serious problem that is an emergency. Do not wait to see if the symptoms will go away. Get medical help right away. Call your local emergency services (911 in the [place] Do not drive yourself to the hospital. Summary  After the procedure, it is common to have bruising and tenderness at the site.  Follow instructions from your health care provider about how to take care of your radial site wound. Check   the wound every day for signs of infection.  Do not push, pull or lift anything that is heavier than 10 lb for 5 days.  This information is not intended to replace advice given to you by your health care provider. Make sure you discuss any questions you have with your health care provider. Document Revised: 08/08/2017 Document Reviewed: 08/08/2017 Elsevier Patient Education  2020 Elsevier Inc. 

## 2020-05-25 ENCOUNTER — Encounter (HOSPITAL_COMMUNITY): Payer: Self-pay | Admitting: Internal Medicine

## 2020-05-25 ENCOUNTER — Encounter: Payer: Self-pay | Admitting: Physical Therapy

## 2020-05-25 ENCOUNTER — Ambulatory Visit: Payer: Medicare PPO | Admitting: Physical Therapy

## 2020-05-25 LAB — POCT I-STAT EG7
Acid-Base Excess: 6 mmol/L — ABNORMAL HIGH (ref 0.0–2.0)
Bicarbonate: 35.2 mmol/L — ABNORMAL HIGH (ref 20.0–28.0)
Calcium, Ion: 1.32 mmol/L (ref 1.15–1.40)
HCT: 43 % (ref 36.0–46.0)
Hemoglobin: 14.6 g/dL (ref 12.0–15.0)
O2 Saturation: 62 %
Potassium: 4.5 mmol/L (ref 3.5–5.1)
Sodium: 143 mmol/L (ref 135–145)
TCO2: 37 mmol/L — ABNORMAL HIGH (ref 22–32)
pCO2, Ven: 68.8 mmHg — ABNORMAL HIGH (ref 44.0–60.0)
pH, Ven: 7.317 (ref 7.250–7.430)
pO2, Ven: 36 mmHg (ref 32.0–45.0)

## 2020-05-27 ENCOUNTER — Ambulatory Visit: Payer: Medicare PPO | Admitting: Physical Therapy

## 2020-05-27 ENCOUNTER — Encounter: Payer: Self-pay | Admitting: Physical Therapy

## 2020-05-27 ENCOUNTER — Other Ambulatory Visit: Payer: Self-pay

## 2020-05-27 DIAGNOSIS — M62830 Muscle spasm of back: Secondary | ICD-10-CM

## 2020-05-27 DIAGNOSIS — Z9181 History of falling: Secondary | ICD-10-CM

## 2020-05-27 DIAGNOSIS — R262 Difficulty in walking, not elsewhere classified: Secondary | ICD-10-CM

## 2020-05-27 DIAGNOSIS — M6281 Muscle weakness (generalized): Secondary | ICD-10-CM | POA: Diagnosis not present

## 2020-05-27 DIAGNOSIS — M545 Low back pain, unspecified: Secondary | ICD-10-CM

## 2020-05-27 DIAGNOSIS — R2689 Other abnormalities of gait and mobility: Secondary | ICD-10-CM

## 2020-05-27 DIAGNOSIS — R5381 Other malaise: Secondary | ICD-10-CM

## 2020-05-27 NOTE — Therapy (Signed)
[place]. [place], [name], [address] Phone: [phone]   Fax:  [phone]  Physical Therapy Treatment  Patient Details  Name: Amanda Short MRN: [id number] Date of Birth: 02-22-1941 Referring Provider (PT): Rennard   Encounter Date: 05/27/2020   PT End of Session - 05/27/20 1651    PT Start Time 1525    PT Stop Time 1608    PT Time Calculation (min) 43 min    Activity Tolerance Patient tolerated treatment well    Behavior During Therapy [facility] for tasks assessed/performed           Past Medical History:  Diagnosis Date  . Abdominal pain   . Abnormal CT of the abdomen   . Acute blood loss anemia   . Acute cystitis with hematuria 12/19/2019  . Acute encephalopathy 06/06/2017  . Acute pulmonary embolism ([place]) 04/01/2013  . Acute respiratory failure with hypoxia ([place])   . Anxiety state   . Arthritis    "back, hands" (07/22/2015)  . Aspiration pneumonia ([place])   . Benign essential HTN   . Cancer of left breast ([place]) 2006   S/P lumpectomy  . Cellulitis of foot, right 07/21/2015  . Cellulitis of right foot 07/21/2015  . Central retinal vein occlusion of right eye 04/20/2016  . Chronic diastolic CHF (congestive heart failure) ([place]) 07/10/2017  . Colitis 05/27/2017  . Complication of anesthesia    "brief breathing problem at surgery center in ~ 2005 when I had gallbladder OR"  . Concussion 03/2013   due to fall  . Depression   . Diabetes ([place]) 04/01/2013  . 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 Diabetes mellitus type 2, controlled ([place]) 07/21/2015  . Diabetic foot infection ([place]) 07/21/2015  . Diverticulitis of colon   . Diverticulitis of large intestine without perforation or abscess without bleeding   . Diverticulosis   . DVT (deep venous thrombosis) ([place]) 03/2013   LLE  . Encephalopathy   . Encounter for attention to tracheostomy ([place])   . Encounter for central line placement   . Encounter for  orogastric (OG) tube placement   . Essential hypertension, benign 04/01/2013  . GERD (gastroesophageal reflux disease)   . History of acute respiratory failure   . History of pulmonary embolism   . HX: breast cancer 04/01/2013  . Hyperlipidemia   . Hypertension   . Hypocalcemia 05/27/2017  . Hypokalemia 07/10/2017  . Hypomagnesemia 07/11/2017  . Hypothyroidism   . Nausea vomiting and diarrhea 07/10/2017  . Non-intractable cyclical vomiting with nausea   . Nuclear sclerotic cataract of both eyes 04/20/2016  . OSA (obstructive sleep apnea) 07/10/2017  . OSA on CPAP 04/01/2013  . OSA treated with BiPAP   . Physical debility 06/23/2017  . 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 Pulmonary embolism ([place]) 03/2013  . Pulmonary HTN ([place]) 07/05/2017  . Seizure ([place]) 07/10/2017  . Seizure disorder ([place]) 07/05/2017  . Seizures ([place])   . Sepsis ([place]) 07/10/2017  . Sleep apnea   . Status epilepticus ([place])   . 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 Tachypnea   . Thyroid disease    Hypothyroid  . Tracheostomy, acute management ([place])   . Type II diabetes mellitus ([place])     Past Surgical History:  Procedure Laterality Date  . ABDOMINAL HYSTERECTOMY  1970s  . BREAST BIOPSY Left 2006  . BREAST LUMPECTOMY Left 2006  . BUNIONECTOMY WITH HAMMERTOE RECONSTRUCTION Left   . ESOPHAGOGASTRODUODENOSCOPY N/A  05/29/2017   Procedure: ESOPHAGOGASTRODUODENOSCOPY (EGD);  Surgeon: Irene Shipper, MD;  Location: [facility];  Service: Endoscopy;  Laterality: N/A;  . JOINT REPLACEMENT    . LAPAROSCOPIC CHOLECYSTECTOMY  ~ 2005  . RIGHT/LEFT HEART CATH AND CORONARY ANGIOGRAPHY N/A 05/24/2020   Procedure: RIGHT/LEFT HEART CATH AND CORONARY ANGIOGRAPHY;  Surgeon: Jolaine Artist, MD;  Location: [facility];  Service: Cardiovascular;  Laterality: N/A;  . TOTAL HIP ARTHROPLASTY Left 2010  . TOTAL KNEE ARTHROPLASTY Bilateral 2005-2006    There were no vitals filed for this visit.   Subjective Assessment -  05/27/20 1525    Subjective Had catheterization  on Monday, She cancelled her appointment here on Tuesdaym she reports that she slept alot Tuesday and really did not feel well yesterday, she is to not lift more than 5# on the right side    Currently in Pain? No/denies                             [facility] Adult PT Treatment/Exercise - 05/27/20 0001      Ambulation/Gait   Gait Comments gait 100 feet CGA/HHA      High Level Balance   High Level Balance Comments on airex standing      Lumbar Exercises: Aerobic   Nustep level 5 x 6 minutes, did not use right arm due to Md precautions about the arm.      Lumbar Exercises: Standing   Other Standing Lumbar Exercises 3# marching, extension and hip abduction and HS curls with walker 2x10       Lumbar Exercises: Seated   Other Seated Lumbar Exercises red tband ankle exercises                    PT Short Term Goals - 12/04/19 1627      PT SHORT TERM GOAL #1   Title independent with intial HEP    Status Achieved             PT Long Term Goals - 05/27/20 1654      PT LONG TERM GOAL #1   Title increase overall hip LE strength for functional gait and safety hips and ankles to 4+/5    Status Partially Met      PT LONG TERM GOAL #2   Title decrease TUG time to less than 14 seconds for functional gait and safety    Status Partially Met                 Plan - 05/27/20 1652    Clinical Impression Statement Patient had a catherterization on Monday, she has a large bruise on the right wrist area, she has been cautioned to not lift anything with this arm or strain with this arm due to the artery that they were in.  I backed off a lot of exercises and had her not do anything with this arm today.  Focus was more on balance.  She had O2 saturation of 92% on 3L O2 during the treatment    PT Next Visit Plan slowly try to progress if arm is okay will also have to see what happens with future cardiologist visit     Consulted and Agree with Plan of Care Patient           Patient will benefit from skilled therapeutic intervention in order to improve the following deficits and impairments:  Abnormal gait, Decreased range of motion,  Difficulty walking, Increased muscle spasms, Decreased activity tolerance, Pain, Decreased balance, Impaired flexibility, Improper body mechanics, Postural dysfunction, Decreased strength, Decreased mobility  Visit Diagnosis: Muscle weakness (generalized)  Risk for falls  Difficulty in walking, not elsewhere classified  Physical debility  Acute bilateral low back pain without sciatica  Other abnormalities of gait and mobility  Muscle spasm of back     Problem List Patient Active Problem List   Diagnosis Date Noted  . Hypoxia 03/23/2020  . Acute cystitis with hematuria 12/19/2019  . Hypomagnesemia 07/11/2017  . Nausea vomiting and diarrhea 07/10/2017  . Hypokalemia 07/10/2017  . Chronic diastolic CHF (congestive heart failure) ([place]) 07/10/2017  . Seizure ([place]) 07/10/2017  . OSA (obstructive sleep apnea) 07/10/2017  . Sepsis ([place]) 07/10/2017  . Seizure disorder ([place]) 07/05/2017  . Pulmonary HTN ([place]) 07/05/2017  . Anxiety state   . Physical debility 06/23/2017  . 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 History of acute respiratory failure   . Tracheostomy, acute management ([place])   . Aspiration pneumonia ([place])   . Encephalopathy   . Status epilepticus ([place])   . Benign essential HTN   . 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 History of pulmonary embolism   . Acute blood loss anemia   . Diverticulitis of colon   . Tachypnea   . Encounter for orogastric (OG) tube placement   . Encounter for central line placement   . Encounter for attention to tracheostomy ([place])   . Acute respiratory failure with hypoxia ([place])   . Acute encephalopathy 06/06/2017  . Diverticulitis of large intestine without perforation or abscess without bleeding   .  Abdominal pain   . Non-intractable cyclical vomiting with nausea   . Abnormal CT of the abdomen   . Hypocalcemia 05/27/2017  . 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 Colitis 05/27/2017  . Central retinal vein occlusion of right eye 04/20/2016  . Nuclear sclerotic cataract of both eyes 04/20/2016  . Diabetic foot infection ([place]) 07/21/2015  . Diabetes mellitus type 2, controlled ([place]) 07/21/2015  . Cellulitis of foot, right 07/21/2015  . Cellulitis of right foot 07/21/2015  . Acute pulmonary embolism ([place]) 04/01/2013  . DVT (deep venous thrombosis) ([place]) 04/01/2013  . HX: breast cancer 04/01/2013  . Concussion 04/01/2013  . Diabetes ([place]) 04/01/2013  . Essential hypertension, benign 04/01/2013  . Hypothyroidism 04/01/2013  . OSA on CPAP 04/01/2013    [name] Boast., PT 05/27/2020, 4:54 PM  [place]. [place], [name], [address] Phone: [phone]   Fax:  [phone]  Name: Amanda Short MRN: [id number] Date of Birth: February 26, 1941

## 2020-05-31 ENCOUNTER — Encounter: Payer: Self-pay | Admitting: Physical Therapy

## 2020-05-31 ENCOUNTER — Other Ambulatory Visit: Payer: Self-pay

## 2020-05-31 ENCOUNTER — Ambulatory Visit: Payer: Medicare PPO | Admitting: Physical Therapy

## 2020-05-31 DIAGNOSIS — M6281 Muscle weakness (generalized): Secondary | ICD-10-CM | POA: Diagnosis not present

## 2020-05-31 DIAGNOSIS — R2689 Other abnormalities of gait and mobility: Secondary | ICD-10-CM

## 2020-05-31 DIAGNOSIS — Z9181 History of falling: Secondary | ICD-10-CM

## 2020-05-31 DIAGNOSIS — R262 Difficulty in walking, not elsewhere classified: Secondary | ICD-10-CM

## 2020-05-31 DIAGNOSIS — M545 Low back pain, unspecified: Secondary | ICD-10-CM

## 2020-05-31 DIAGNOSIS — M62830 Muscle spasm of back: Secondary | ICD-10-CM

## 2020-05-31 DIAGNOSIS — R5381 Other malaise: Secondary | ICD-10-CM

## 2020-05-31 NOTE — Therapy (Signed)
[place]. [place], [name], [address] Phone: [phone]   Fax:  [phone] Progress Note Reporting Period 04/15/20 to 05/31/20 for visits 31-40  See note below for Objective Data and Assessment of Progress/Goals.      Physical Therapy Treatment  Patient Details  Name: Amanda Short MRN: [id number] Date of Birth: Nov 07, 1940 Referring Provider (PT): Rennard   Encounter Date: 05/31/2020   PT End of Session - 05/31/20 1653    Visit Number 40    Date for PT Re-Evaluation 06/18/20    Authorization Type [employer]    PT Start Time 1603    PT Stop Time 3016    PT Time Calculation (min) 44 min    Activity Tolerance Patient tolerated treatment well    Behavior During Therapy [facility] for tasks assessed/performed           Past Medical History:  Diagnosis Date  . Abdominal pain   . Abnormal CT of the abdomen   . Acute blood loss anemia   . Acute cystitis with hematuria 12/19/2019  . Acute encephalopathy 06/06/2017  . Acute pulmonary embolism ([place]) 04/01/2013  . Acute respiratory failure with hypoxia ([place])   . Anxiety state   . Arthritis    "back, hands" (07/22/2015)  . Aspiration pneumonia ([place])   . Benign essential HTN   . Cancer of left breast ([place]) 2006   S/P lumpectomy  . Cellulitis of foot, right 07/21/2015  . Cellulitis of right foot 07/21/2015  . Central retinal vein occlusion of right eye 04/20/2016  . Chronic diastolic CHF (congestive heart failure) ([place]) 07/10/2017  . Colitis 05/27/2017  . Complication of anesthesia    "brief breathing problem at surgery center in ~ 2005 when I had gallbladder OR"  . Concussion 03/2013   due to fall  . Depression   . Diabetes ([place]) 04/01/2013  . 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 Diabetes mellitus type 2, controlled ([place]) 07/21/2015  . Diabetic foot infection ([place]) 07/21/2015  . Diverticulitis of colon   . Diverticulitis of large intestine without perforation  or abscess without bleeding   . Diverticulosis   . DVT (deep venous thrombosis) ([place]) 03/2013   LLE  . Encephalopathy   . Encounter for attention to tracheostomy ([place])   . Encounter for central line placement   . Encounter for orogastric (OG) tube placement   . Essential hypertension, benign 04/01/2013  . GERD (gastroesophageal reflux disease)   . History of acute respiratory failure   . History of pulmonary embolism   . HX: breast cancer 04/01/2013  . Hyperlipidemia   . Hypertension   . Hypocalcemia 05/27/2017  . Hypokalemia 07/10/2017  . Hypomagnesemia 07/11/2017  . Hypothyroidism   . Nausea vomiting and diarrhea 07/10/2017  . Non-intractable cyclical vomiting with nausea   . Nuclear sclerotic cataract of both eyes 04/20/2016  . OSA (obstructive sleep apnea) 07/10/2017  . OSA on CPAP 04/01/2013  . OSA treated with BiPAP   . Physical debility 06/23/2017  . 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 Pulmonary embolism ([place]) 03/2013  . Pulmonary HTN ([place]) 07/05/2017  . Seizure ([place]) 07/10/2017  . Seizure disorder ([place]) 07/05/2017  . Seizures ([place])   . Sepsis ([place]) 07/10/2017  . Sleep apnea   . Status epilepticus ([place])   . 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 Tachypnea   . Thyroid disease    Hypothyroid  . Tracheostomy, acute management ([place])   . Type  II diabetes mellitus ([place])     Past Surgical History:  Procedure Laterality Date  . ABDOMINAL HYSTERECTOMY  1970s  . BREAST BIOPSY Left 2006  . BREAST LUMPECTOMY Left 2006  . BUNIONECTOMY WITH HAMMERTOE RECONSTRUCTION Left   . ESOPHAGOGASTRODUODENOSCOPY N/A 05/29/2017   Procedure: ESOPHAGOGASTRODUODENOSCOPY (EGD);  Surgeon: Irene Shipper, MD;  Location: [facility];  Service: Endoscopy;  Laterality: N/A;  . JOINT REPLACEMENT    . LAPAROSCOPIC CHOLECYSTECTOMY  ~ 2005  . RIGHT/LEFT HEART CATH AND CORONARY ANGIOGRAPHY N/A 05/24/2020   Procedure: RIGHT/LEFT HEART CATH AND CORONARY ANGIOGRAPHY;  Surgeon: Jolaine Artist, MD;   Location: [facility];  Service: Cardiovascular;  Laterality: N/A;  . TOTAL HIP ARTHROPLASTY Left 2010  . TOTAL KNEE ARTHROPLASTY Bilateral 2005-2006    There were no vitals filed for this visit.   Subjective Assessment - 05/31/20 1609    Subjective Patient reports that the wrist is getting better where they did the arterial stick, some back pain while doing things around the house    Currently in Pain? Yes    Pain Score 3     Pain Location Back    Pain Orientation Lower    Aggravating Factors  housework                             [facility] Adult PT Treatment/Exercise - 05/31/20 0001      Ambulation/Gait   Gait Comments gait 120 feet, then rest, then 220 feet then rest, this last walk did cause shortness of breath, O2 dropped from 92% to 86%, but recovered after 2 minutes      Lumbar Exercises: Aerobic   Nustep level 5 x 6 minutes UE and LE's      Lumbar Exercises: Standing   Other Standing Lumbar Exercises 3# marching, extension and hip abduction and HS curls with walker 2x10       Lumbar Exercises: Seated   Long Arc Quad on Chair Both;2 sets;10 reps    LAQ on Chair Weights (lbs) 3#    Other Seated Lumbar Exercises green tband HS curls, 3# marches    Other Seated Lumbar Exercises red tband ankle exercises                    PT Short Term Goals - 12/04/19 1627      PT SHORT TERM GOAL #1   Title independent with intial HEP    Status Achieved             PT Long Term Goals - 05/27/20 1654      PT LONG TERM GOAL #1   Title increase overall hip LE strength for functional gait and safety hips and ankles to 4+/5    Status Partially Met      PT LONG TERM GOAL #2   Title decrease TUG time to less than 14 seconds for functional gait and safety    Status Partially Met                 Plan - 05/31/20 1653    Clinical Impression Statement Patient still with significant bruising from the arterial stick in the radial area, orders to  limit lifting 10# for 5 days, I have tried to avoid this today at day 7 due to the bruising, we may try to resume some increased arm activities next visit  Trying to increase her tolerance to activity    PT Next  Visit Plan slowly try to progress if arm is okay will also have to see what happens with future cardiologist visit    Consulted and Agree with Plan of Care Patient           Patient will benefit from skilled therapeutic intervention in order to improve the following deficits and impairments:  Abnormal gait, Decreased range of motion, Difficulty walking, Increased muscle spasms, Decreased activity tolerance, Pain, Decreased balance, Impaired flexibility, Improper body mechanics, Postural dysfunction, Decreased strength, Decreased mobility  Visit Diagnosis: Muscle weakness (generalized)  Risk for falls  Difficulty in walking, not elsewhere classified  Physical debility  Acute bilateral low back pain without sciatica  Other abnormalities of gait and mobility  Muscle spasm of back     Problem List Patient Active Problem List   Diagnosis Date Noted  . Hypoxia 03/23/2020  . Acute cystitis with hematuria 12/19/2019  . Hypomagnesemia 07/11/2017  . Nausea vomiting and diarrhea 07/10/2017  . Hypokalemia 07/10/2017  . Chronic diastolic CHF (congestive heart failure) ([place]) 07/10/2017  . Seizure ([place]) 07/10/2017  . OSA (obstructive sleep apnea) 07/10/2017  . Sepsis ([place]) 07/10/2017  . Seizure disorder ([place]) 07/05/2017  . Pulmonary HTN ([place]) 07/05/2017  . Anxiety state   . Physical debility 06/23/2017  . 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 History of acute respiratory failure   . Tracheostomy, acute management ([place])   . Aspiration pneumonia ([place])   . Encephalopathy   . Status epilepticus ([place])   . Benign essential HTN   . 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 History of pulmonary embolism   . Acute blood loss anemia   . Diverticulitis of colon   .  Tachypnea   . Encounter for orogastric (OG) tube placement   . Encounter for central line placement   . Encounter for attention to tracheostomy ([place])   . Acute respiratory failure with hypoxia ([place])   . Acute encephalopathy 06/06/2017  . Diverticulitis of large intestine without perforation or abscess without bleeding   . Abdominal pain   . Non-intractable cyclical vomiting with nausea   . Abnormal CT of the abdomen   . Hypocalcemia 05/27/2017  . 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 Colitis 05/27/2017  . Central retinal vein occlusion of right eye 04/20/2016  . Nuclear sclerotic cataract of both eyes 04/20/2016  . Diabetic foot infection ([place]) 07/21/2015  . Diabetes mellitus type 2, controlled ([place]) 07/21/2015  . Cellulitis of foot, right 07/21/2015  . Cellulitis of right foot 07/21/2015  . Acute pulmonary embolism ([place]) 04/01/2013  . DVT (deep venous thrombosis) ([place]) 04/01/2013  . HX: breast cancer 04/01/2013  . Concussion 04/01/2013  . Diabetes ([place]) 04/01/2013  . Essential hypertension, benign 04/01/2013  . Hypothyroidism 04/01/2013  . OSA on CPAP 04/01/2013    [name] Boast., PT 05/31/2020, 4:56 PM  [place]. [place], [name], [address] Phone: [phone]   Fax:  [phone]  Name: Marijah Larranaga MRN: [id number] Date of Birth: 1940-08-06

## 2020-06-02 ENCOUNTER — Other Ambulatory Visit: Payer: Self-pay

## 2020-06-02 ENCOUNTER — Encounter: Payer: Self-pay | Admitting: Physical Therapy

## 2020-06-02 ENCOUNTER — Ambulatory Visit: Payer: Medicare PPO | Admitting: Physical Therapy

## 2020-06-02 DIAGNOSIS — R2689 Other abnormalities of gait and mobility: Secondary | ICD-10-CM

## 2020-06-02 DIAGNOSIS — M545 Low back pain, unspecified: Secondary | ICD-10-CM

## 2020-06-02 DIAGNOSIS — Z9181 History of falling: Secondary | ICD-10-CM

## 2020-06-02 DIAGNOSIS — M62830 Muscle spasm of back: Secondary | ICD-10-CM

## 2020-06-02 DIAGNOSIS — R5381 Other malaise: Secondary | ICD-10-CM

## 2020-06-02 DIAGNOSIS — M6281 Muscle weakness (generalized): Secondary | ICD-10-CM | POA: Diagnosis not present

## 2020-06-02 DIAGNOSIS — R262 Difficulty in walking, not elsewhere classified: Secondary | ICD-10-CM

## 2020-06-02 NOTE — Therapy (Signed)
[facility] [address] [place], [place], [address] Phone: [phone]   Fax:  [phone]  Physical Therapy Treatment  Patient Details  Name: Amanda Short MRN: [id number] Date of Birth: 07/02/1941 Referring Provider (PT): Rennard   Encounter Date: 06/02/2020   PT End of Session - 06/02/20 1347    Visit Number 41    Date for PT Re-Evaluation 06/18/20    Authorization Type [employer]    Activity Tolerance Patient tolerated treatment well    Behavior During Therapy WFL for tasks assessed/performed           Past Medical History:  Diagnosis Date  . Abdominal pain   . Abnormal CT of the abdomen   . Acute blood loss anemia   . Acute cystitis with hematuria 12/19/2019  . Acute encephalopathy 06/06/2017  . Acute pulmonary embolism (HCC) 04/01/2013  . Acute respiratory failure with hypoxia (HCC)   . Anxiety state   . Arthritis    "back, hands" (07/22/2015)  . Aspiration pneumonia (HCC)   . Benign essential HTN   . Cancer of left breast (HCC) 2006   S/P lumpectomy  . Cellulitis of foot, right 07/21/2015  . Cellulitis of right foot 07/21/2015  . Central retinal vein occlusion of right eye 04/20/2016  . Chronic diastolic CHF (congestive heart failure) (HCC) 07/10/2017  . Colitis 05/27/2017  . Complication of anesthesia    "brief breathing problem at surgery center in ~ 2005 when I had gallbladder OR"  . Concussion 03/2013   due to fall  . Depression   . Diabetes (HCC) 04/01/2013  . 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 Diabetes mellitus type 2, controlled (HCC) 07/21/2015  . Diabetic foot infection (HCC) 07/21/2015  . Diverticulitis of colon   . Diverticulitis of large intestine without perforation or abscess without bleeding   . Diverticulosis   . DVT (deep venous thrombosis) (HCC) 03/2013   LLE  . Encephalopathy   . Encounter for attention to tracheostomy (HCC)   . Encounter for central line placement   . Encounter  for orogastric (OG) tube placement   . Essential hypertension, benign 04/01/2013  . GERD (gastroesophageal reflux disease)   . History of acute respiratory failure   . History of pulmonary embolism   . HX: breast cancer 04/01/2013  . Hyperlipidemia   . Hypertension   . Hypocalcemia 05/27/2017  . Hypokalemia 07/10/2017  . Hypomagnesemia 07/11/2017  . Hypothyroidism   . Nausea vomiting and diarrhea 07/10/2017  . Non-intractable cyclical vomiting with nausea   . Nuclear sclerotic cataract of both eyes 04/20/2016  . OSA (obstructive sleep apnea) 07/10/2017  . OSA on CPAP 04/01/2013  . OSA treated with BiPAP   . Physical debility 06/23/2017  . 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 Pulmonary embolism (HCC) 03/2013  . Pulmonary HTN (HCC) 07/05/2017  . Seizure (HCC) 07/10/2017  . Seizure disorder (HCC) 07/05/2017  . Seizures (HCC)   . Sepsis (HCC) 07/10/2017  . Sleep apnea   . Status epilepticus (HCC)   . 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 Tachypnea   . Thyroid disease    Hypothyroid  . Tracheostomy, acute management (HCC)   . Type II diabetes mellitus (HCC)     Past Surgical History:  Procedure Laterality Date  . ABDOMINAL HYSTERECTOMY  1970s  . BREAST BIOPSY Left 2006  . BREAST LUMPECTOMY Left 2006  . BUNIONECTOMY WITH HAMMERTOE RECONSTRUCTION Left   . ESOPHAGOGASTRODUODENOSCOPY N/A 05/29/2017     Procedure: ESOPHAGOGASTRODUODENOSCOPY (EGD);  Surgeon: Perry, John N, MD;  Location: [facility];  Service: Endoscopy;  Laterality: N/A;  . JOINT REPLACEMENT    . LAPAROSCOPIC CHOLECYSTECTOMY  ~ 2005  . RIGHT/LEFT HEART CATH AND CORONARY ANGIOGRAPHY N/A 05/24/2020   Procedure: RIGHT/LEFT HEART CATH AND CORONARY ANGIOGRAPHY;  Surgeon: Bensimhon, Daniel R, MD;  Location: [facility];  Service: Cardiovascular;  Laterality: N/A;  . TOTAL HIP ARTHROPLASTY Left 2010  . TOTAL KNEE ARTHROPLASTY Bilateral 2005-2006    There were no vitals filed for this visit.   Subjective Assessment  - 06/02/20 1305    Subjective Patient reports that she had some heart palpitations this AM, she did not check her HR, but reports that she had been doing without the O2  most of the morning, she reports that after about 10 minutes it was okay, I checked O2 now it was 91% and HR was 82    Currently in Pain? No/denies                             [facility] Adult PT Treatment/Exercise - 06/02/20 0001      Ambulation/Gait   Gait Comments gait 90 feet and then 180 feet a few rest breaks due to SOB      High Level Balance   High Level Balance Activities Side stepping;Backward walking    High Level Balance Comments on airex head turns, on airex 4" step toe touches      Lumbar Exercises: Aerobic   Nustep level 5 x 6 minutes UE and LE's      Lumbar Exercises: Machines for Strengthening   Cybex Knee Extension 5# 2x15    Cybex Knee Flexion 20# 2x15      Lumbar Exercises: Standing   Other Standing Lumbar Exercises 3# marching, extension and hip abduction and HS curls with walker 2x10                     PT Short Term Goals - 12/04/19 1627      PT SHORT TERM GOAL #1   Title independent with intial HEP    Status Achieved             PT Long Term Goals - 06/02/20 1355      PT LONG TERM GOAL #1   Title increase overall hip LE strength for functional gait and safety hips and ankles to 4+/5    Status Partially Met                 Plan - 06/02/20 1347    Clinical Impression Statement Tried to go back to some weights but decresaed from previous, WE did a little more walking today, did well with the shorter distance no fatigue and no stopping, with the longer distance outside she needed two rest breaks due to fatigue    PT Next Visit Plan slowly try to progress if arm is okay will also have to see what happens with future cardiologist visit    Consulted and Agree with Plan of Care Patient           Patient will benefit from skilled therapeutic  intervention in order to improve the following deficits and impairments:  Abnormal gait, Decreased range of motion, Difficulty walking, Increased muscle spasms, Decreased activity tolerance, Pain, Decreased balance, Impaired flexibility, Improper body mechanics, Postural dysfunction, Decreased strength, Decreased mobility  Visit Diagnosis: Muscle weakness (generalized)  Risk for   falls  Difficulty in walking, not elsewhere classified  Physical debility  Acute bilateral low back pain without sciatica  Other abnormalities of gait and mobility  Muscle spasm of back     Problem List Patient Active Problem List   Diagnosis Date Noted  . Hypoxia 03/23/2020  . Acute cystitis with hematuria 12/19/2019  . Hypomagnesemia 07/11/2017  . Nausea vomiting and diarrhea 07/10/2017  . Hypokalemia 07/10/2017  . Chronic diastolic CHF (congestive heart failure) ([place]) 07/10/2017  . Seizure ([place]) 07/10/2017  . OSA (obstructive sleep apnea) 07/10/2017  . Sepsis ([place]) 07/10/2017  . Seizure disorder ([place]) 07/05/2017  . Pulmonary HTN ([place]) 07/05/2017  . Anxiety state   . Physical debility 06/23/2017  . 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 History of acute respiratory failure   . Tracheostomy, acute management ([place])   . Aspiration pneumonia ([place])   . Encephalopathy   . Status epilepticus ([place])   . Benign essential HTN   . 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 History of pulmonary embolism   . Acute blood loss anemia   . Diverticulitis of colon   . Tachypnea   . Encounter for orogastric (OG) tube placement   . Encounter for central line placement   . Encounter for attention to tracheostomy ([place])   . Acute respiratory failure with hypoxia ([place])   . Acute encephalopathy 06/06/2017  . Diverticulitis of large intestine without perforation or abscess without bleeding   . Abdominal pain   . Non-intractable cyclical vomiting with nausea   . Abnormal CT of the abdomen   . Hypocalcemia  05/27/2017  . 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 Colitis 05/27/2017  . Central retinal vein occlusion of right eye 04/20/2016  . Nuclear sclerotic cataract of both eyes 04/20/2016  . Diabetic foot infection ([place]) 07/21/2015  . Diabetes mellitus type 2, controlled ([place]) 07/21/2015  . Cellulitis of foot, right 07/21/2015  . Cellulitis of right foot 07/21/2015  . Acute pulmonary embolism ([place]) 04/01/2013  . DVT (deep venous thrombosis) ([place]) 04/01/2013  . HX: breast cancer 04/01/2013  . Concussion 04/01/2013  . Diabetes ([place]) 04/01/2013  . Essential hypertension, benign 04/01/2013  . Hypothyroidism 04/01/2013  . OSA on CPAP 04/01/2013    [name] Boast., PT 06/02/2020, 1:56 PM  [place]. [place], [name], [address] Phone: [phone]   Fax:  [phone]  Name: Amanda Short MRN: [id number] Date of Birth: 11-11-1940

## 2020-06-08 ENCOUNTER — Ambulatory Visit: Payer: Medicare PPO | Admitting: Physical Therapy

## 2020-06-08 ENCOUNTER — Other Ambulatory Visit: Payer: Self-pay

## 2020-06-08 DIAGNOSIS — M1990 Unspecified osteoarthritis, unspecified site: Secondary | ICD-10-CM | POA: Insufficient documentation

## 2020-06-08 DIAGNOSIS — R5381 Other malaise: Secondary | ICD-10-CM

## 2020-06-08 DIAGNOSIS — G4733 Obstructive sleep apnea (adult) (pediatric): Secondary | ICD-10-CM | POA: Insufficient documentation

## 2020-06-08 DIAGNOSIS — E079 Disorder of thyroid, unspecified: Secondary | ICD-10-CM | POA: Insufficient documentation

## 2020-06-08 DIAGNOSIS — M545 Low back pain, unspecified: Secondary | ICD-10-CM

## 2020-06-08 DIAGNOSIS — R569 Unspecified convulsions: Secondary | ICD-10-CM | POA: Insufficient documentation

## 2020-06-08 DIAGNOSIS — Z9181 History of falling: Secondary | ICD-10-CM

## 2020-06-08 DIAGNOSIS — R262 Difficulty in walking, not elsewhere classified: Secondary | ICD-10-CM

## 2020-06-08 DIAGNOSIS — G4730 Sleep apnea, unspecified: Secondary | ICD-10-CM | POA: Insufficient documentation

## 2020-06-08 DIAGNOSIS — M6281 Muscle weakness (generalized): Secondary | ICD-10-CM

## 2020-06-08 DIAGNOSIS — T8859XA Other complications of anesthesia, initial encounter: Secondary | ICD-10-CM | POA: Insufficient documentation

## 2020-06-08 DIAGNOSIS — E119 Type 2 diabetes mellitus without complications: Secondary | ICD-10-CM | POA: Insufficient documentation

## 2020-06-08 DIAGNOSIS — I10 Essential (primary) hypertension: Secondary | ICD-10-CM | POA: Insufficient documentation

## 2020-06-08 DIAGNOSIS — K5790 Diverticulosis of intestine, part unspecified, without perforation or abscess without bleeding: Secondary | ICD-10-CM | POA: Insufficient documentation

## 2020-06-08 DIAGNOSIS — E785 Hyperlipidemia, unspecified: Secondary | ICD-10-CM | POA: Insufficient documentation

## 2020-06-08 DIAGNOSIS — K219 Gastro-esophageal reflux disease without esophagitis: Secondary | ICD-10-CM | POA: Insufficient documentation

## 2020-06-08 DIAGNOSIS — F32A Depression, unspecified: Secondary | ICD-10-CM | POA: Insufficient documentation

## 2020-06-08 NOTE — Therapy (Signed)
[facility] [place], [name], [address] Phone: [phone]   Fax:  [phone]  Physical Therapy Treatment  Patient Details  Name: Amanda Short MRN: [id number] Date of Birth: 04/16/1941 Referring Provider (PT): Rennard   Encounter Date: 06/08/2020   PT End of Session - 06/08/20 1346    Visit Number 42    Number of Visits 46    Date for PT Re-Evaluation 06/18/20    Authorization Type [employer]    PT Start Time 1300    PT Stop Time 1347    PT Time Calculation (min) 47 min    Activity Tolerance Patient tolerated treatment well    Behavior During Therapy [facility] for tasks assessed/performed           Past Medical History:  Diagnosis Date  . Abdominal pain   . Abnormal CT of the abdomen   . Acute blood loss anemia   . Acute cystitis with hematuria 12/19/2019  . Acute encephalopathy 06/06/2017  . Acute pulmonary embolism ([place]) 04/01/2013  . Acute respiratory failure with hypoxia ([place])   . Anxiety state   . Arthritis    "back, hands" (07/22/2015)  . Aspiration pneumonia ([place])   . Benign essential HTN   . Cancer of left breast ([place]) 2006   S/P lumpectomy  . Cellulitis of foot, right 07/21/2015  . Cellulitis of right foot 07/21/2015  . Central retinal vein occlusion of right eye 04/20/2016  . Chronic diastolic CHF (congestive heart failure) ([place]) 07/10/2017  . Colitis 05/27/2017  . Complication of anesthesia    "brief breathing problem at surgery center in ~ 2005 when I had gallbladder OR"  . Concussion 03/2013   due to fall  . Depression   . Diabetes ([place]) 04/01/2013  . 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 Diabetes mellitus type 2, controlled ([place]) 07/21/2015  . Diabetic foot infection ([place]) 07/21/2015  . Diverticulitis of colon   . Diverticulitis of large intestine without perforation or abscess without bleeding   . Diverticulosis   . DVT (deep venous thrombosis) ([place]) 03/2013   LLE  . Encephalopathy    . Encounter for attention to tracheostomy ([place])   . Encounter for central line placement   . Encounter for orogastric (OG) tube placement   . Essential hypertension, benign 04/01/2013  . GERD (gastroesophageal reflux disease)   . History of acute respiratory failure   . History of pulmonary embolism   . HX: breast cancer 04/01/2013  . Hyperlipidemia   . Hypertension   . Hypocalcemia 05/27/2017  . Hypokalemia 07/10/2017  . Hypomagnesemia 07/11/2017  . Hypothyroidism   . Hypoxia 03/23/2020  . Nausea vomiting and diarrhea 07/10/2017  . Non-intractable cyclical vomiting with nausea   . Nuclear sclerotic cataract of both eyes 04/20/2016  . OSA (obstructive sleep apnea) 07/10/2017  . OSA on CPAP 04/01/2013  . OSA treated with BiPAP   . Physical debility 06/23/2017  . 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 Pulmonary embolism ([place]) 03/2013  . Pulmonary HTN ([place]) 07/05/2017  . Seizure ([place]) 07/10/2017  . Seizure disorder ([place]) 07/05/2017  . Seizures ([place])   . Sepsis ([place]) 07/10/2017  . Sleep apnea   . Status epilepticus ([place])   . 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 Tachypnea   . Thyroid disease    Hypothyroid  . Tracheostomy, acute management ([place])   . Type II diabetes mellitus ([place])     Past Surgical History:  Procedure Laterality  Date  . ABDOMINAL HYSTERECTOMY  1970s  . BREAST BIOPSY Left 2006  . BREAST LUMPECTOMY Left 2006  . BUNIONECTOMY WITH HAMMERTOE RECONSTRUCTION Left   . ESOPHAGOGASTRODUODENOSCOPY N/A 05/29/2017   Procedure: ESOPHAGOGASTRODUODENOSCOPY (EGD);  Surgeon: Irene Shipper, MD;  Location: [facility];  Service: Endoscopy;  Laterality: N/A;  . JOINT REPLACEMENT    . LAPAROSCOPIC CHOLECYSTECTOMY  ~ 2005  . RIGHT/LEFT HEART CATH AND CORONARY ANGIOGRAPHY N/A 05/24/2020   Procedure: RIGHT/LEFT HEART CATH AND CORONARY ANGIOGRAPHY;  Surgeon: Jolaine Artist, MD;  Location: [facility];  Service: Cardiovascular;  Laterality: N/A;  . TOTAL HIP ARTHROPLASTY Left  2010  . TOTAL KNEE ARTHROPLASTY Bilateral 2005-2006    There were no vitals filed for this visit.   Subjective Assessment - 06/08/20 1301    Subjective Patient reports an occasional heart palpitation, but no real issues, just fatigued feeling    Currently in Pain? No/denies              [facility] PT Assessment - 06/08/20 0001      Timed Up and Go Test   Normal TUG (seconds) 13                         [facility] Adult PT Treatment/Exercise - 06/08/20 0001      High Level Balance   High Level Balance Activities Negotiating over obstacles;Negotitating around obstacles    High Level Balance Comments she had some right low back pain with the obstacle course reports may have been being up longer.  on airex scapular stabilization      Lumbar Exercises: Aerobic   Nustep level 5 x 6 minutes UE and LE's      Lumbar Exercises: Machines for Strengthening   Cybex Knee Extension 10# 3x10    Cybex Knee Flexion 20# 2x15      Lumbar Exercises: Standing   Other Standing Lumbar Exercises 3# marching, extension and hip abduction and HS curls with walker 2x10       Lumbar Exercises: Seated   Other Seated Lumbar Exercises red tband ankle exercises                    PT Short Term Goals - 12/04/19 1627      PT SHORT TERM GOAL #1   Title independent with intial HEP    Status Achieved             PT Long Term Goals - 06/08/20 1351      PT LONG TERM GOAL #1   Title increase overall hip LE strength for functional gait and safety hips and ankles to 4+/5    Status Partially Met      PT LONG TERM GOAL #2   Title decrease TUG time to less than 14 seconds for functional gait and safety    Status Partially Met                 Plan - 06/08/20 1347    Clinical Impression Statement Patient with O2 saturation down to 82% after the Nustep, this is the lowest I have seen it since she started oxygen.  It quickly returned to 93% without any other issues.  She will be  seeing her cardiologist on Monday.   She seemed to be more fatigued today    PT Next Visit Plan will see if there is any changes from the cardiologist    Consulted and Agree with Plan of  Care Patient           Patient will benefit from skilled therapeutic intervention in order to improve the following deficits and impairments:  Abnormal gait, Decreased range of motion, Difficulty walking, Increased muscle spasms, Decreased activity tolerance, Pain, Decreased balance, Impaired flexibility, Improper body mechanics, Postural dysfunction, Decreased strength, Decreased mobility  Visit Diagnosis: Muscle weakness (generalized)  Risk for falls  Difficulty in walking, not elsewhere classified  Physical debility  Acute bilateral low back pain without sciatica     Problem List Patient Active Problem List   Diagnosis Date Noted  . Type II diabetes mellitus ([place])   . Thyroid disease   . Sleep apnea   . Seizures ([place])   . OSA treated with BiPAP   . Hypertension   . Hyperlipidemia   . GERD (gastroesophageal reflux disease)   . Diverticulosis   . Depression   . Complication of anesthesia   . Arthritis   . Hypoxia 03/23/2020  . Acute cystitis with hematuria 12/19/2019  . Hypomagnesemia 07/11/2017  . Nausea vomiting and diarrhea 07/10/2017  . Hypokalemia 07/10/2017  . Chronic diastolic CHF (congestive heart failure) ([place]) 07/10/2017  . Seizure ([place]) 07/10/2017  . OSA (obstructive sleep apnea) 07/10/2017  . Sepsis ([place]) 07/10/2017  . Seizure disorder ([place]) 07/05/2017  . Pulmonary HTN ([place]) 07/05/2017  . Anxiety state   . Physical debility 06/23/2017  . 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 History of acute respiratory failure   . Tracheostomy, acute management ([place])   . Aspiration pneumonia ([place])   . Encephalopathy   . Status epilepticus ([place])   . Benign essential HTN   . 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 History of pulmonary embolism   . Acute blood loss  anemia   . Diverticulitis of colon   . Tachypnea   . Encounter for orogastric (OG) tube placement   . Encounter for central line placement   . Encounter for attention to tracheostomy ([place])   . Acute respiratory failure with hypoxia ([place])   . Acute encephalopathy 06/06/2017  . Diverticulitis of large intestine without perforation or abscess without bleeding   . Abdominal pain   . Non-intractable cyclical vomiting with nausea   . Hypocalcemia 05/27/2017  . 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 Colitis 05/27/2017  . Central retinal vein occlusion of right eye 04/20/2016  . Nuclear sclerotic cataract of both eyes 04/20/2016  . Diabetic foot infection ([place]) 07/21/2015  . Diabetes mellitus type 2, controlled ([place]) 07/21/2015  . Cellulitis of foot, right 07/21/2015  . Cellulitis of right foot 07/21/2015  . Acute pulmonary embolism ([place]) 04/01/2013  . DVT (deep venous thrombosis) ([place]) 04/01/2013  . HX: breast cancer 04/01/2013  . Concussion 04/01/2013  . Diabetes ([place]) 04/01/2013  . Essential hypertension, benign 04/01/2013  . Hypothyroidism 04/01/2013  . OSA on CPAP 04/01/2013  . Pulmonary embolism ([place]) 03/2013  . Cancer of left breast [facility]) 2006    [name] Boast., PT 06/08/2020, 1:52 PM  [facility] [place], [name], [address] Phone: [phone]   Fax:  [phone]  Name: Amanda Short MRN: [id number] Date of Birth: [birth year]

## 2020-06-09 ENCOUNTER — Ambulatory Visit: Payer: Medicare PPO | Admitting: Cardiology

## 2020-06-12 NOTE — Progress Notes (Signed)
[facility]  NOTE  Referring Physician: Dr. Agustin Cree Primary Care: Libby Maw, MD Primary Cardiologist: Dr. Agustin Cree  HPI:  Amanda Short is a 79 y.o. female with multiple medical issues including DM2, HTN. OSA previously on CPAP), previous smoker (quit 1995), previous DVT/PE and h/o chronic respiratory failure s/p previous tracheostomy (removed) referred by Dr. Drue Dun for further evaluation of pulmonary HTN seen on echo.   Echo 7/21: LVEF 65-705 with restrictive diastolic filling  RV moderately enlarged with moderate HK. Mod to severe TR. Severe septal flattening RVSP estimated at 108.  CT abdomen 2/21: showed calcified granuloma in RLL VQ scan 8/21: Normal  In 2018 had severe n/v/d and led to intractable seizures. Got extubated but then developed recurrent distress but could not be re-intubated due to laryngeal swelling. Had emergent trach which was removed after several weeks. Smoked 1ppd x 20+ years. Quit in 1995.   I saw her for the first time in 10/21 and recommended R/L cath.   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 PFTs still pending. Remains very fatigued and SOB. No edema, orthopnea or PND. Denies syncope or presyncope. Says she does have a balance problem. Continues to wear O2 at 3L. Sats in 90s. Will drop in 70s without O2.    Past Medical History:  Diagnosis Date  . Abdominal pain   . Abnormal CT of the abdomen   . Acute blood loss anemia   . Acute cystitis with hematuria 12/19/2019  . Acute encephalopathy 06/06/2017  . Acute pulmonary embolism ([place]) 04/01/2013  . Acute respiratory failure with hypoxia ([place])   . Anxiety state   . Arthritis    "back, hands" (07/22/2015)  . Aspiration pneumonia ([place])   . Benign essential HTN    . Cancer of left breast ([place]) 2006   S/P lumpectomy  . Cellulitis of foot, right 07/21/2015  . Cellulitis of right foot 07/21/2015  . Central retinal vein occlusion of right eye 04/20/2016  . Chronic diastolic CHF (congestive heart failure) ([place]) 07/10/2017  . Colitis 05/27/2017  . Complication of anesthesia    "brief breathing problem at surgery center in ~ 2005 when I had gallbladder OR"  . Concussion 03/2013   due to fall  . Depression   . Diabetes ([place]) 04/01/2013  . 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 Diabetes mellitus type 2, controlled ([place]) 07/21/2015  . Diabetic foot infection ([place]) 07/21/2015  . Diverticulitis of colon   . Diverticulitis of large intestine without perforation or abscess without bleeding   . Diverticulosis   . DVT (deep venous thrombosis) ([place]) 03/2013   LLE  . Encephalopathy   . Encounter for attention to tracheostomy ([place])   . Encounter for central line placement   . Encounter for orogastric (OG) tube placement   . Essential hypertension, benign 04/01/2013  . GERD (gastroesophageal reflux disease)   . History of acute respiratory failure   . History of pulmonary embolism   . HX: breast cancer 04/01/2013  . Hyperlipidemia   . Hypertension   . Hypocalcemia 05/27/2017  . Hypokalemia 07/10/2017  . Hypomagnesemia 07/11/2017  . Hypothyroidism   . Hypoxia 03/23/2020  . Nausea vomiting  and diarrhea 07/10/2017  . Non-intractable cyclical vomiting with nausea   . Nuclear sclerotic cataract of both eyes 04/20/2016  . OSA (obstructive sleep apnea) 07/10/2017  . OSA on CPAP 04/01/2013  . OSA treated with BiPAP   . Physical debility 06/23/2017  . 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 Pulmonary embolism ([place]) 03/2013  . Pulmonary HTN ([place]) 07/05/2017  . Seizure ([place]) 07/10/2017  . Seizure disorder ([place]) 07/05/2017  . Seizures ([place])   . Sepsis ([place]) 07/10/2017  . Sleep apnea   . Status epilepticus ([place])   . 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 Tachypnea     . Thyroid disease    Hypothyroid  . Tracheostomy, acute management ([place])   . Type II diabetes mellitus ([place])     Current Outpatient Medications  Medication Sig Dispense Refill  . acetaminophen (TYLENOL) 500 MG tablet Take 500-1,000 mg by mouth every 6 (six) hours as needed for mild pain.     Marland Kitchen ALPRAZolam (XANAX) 0.5 MG tablet TAKE 1 TABLET BY MOUTH EVERYDAY AT BEDTIME 30 tablet 4  . atorvastatin (LIPITOR) 20 MG tablet Take 20 mg by mouth daily.    . BD PEN NEEDLE NANO 2ND GEN 32G X 4 MM MISC daily.    . calcium-vitamin D (OSCAL WITH D) 500-200 MG-UNIT tablet Take 1 tablet by mouth 2 (two) times daily.    Marland Kitchen diltiazem (CARDIZEM) 30 MG tablet Take 30 mg by mouth 2 (two) times daily.     . DULoxetine (CYMBALTA) 60 MG capsule Take 60 mg by mouth daily.    . febuxostat (ULORIC) 40 MG tablet Take 40 mg daily by mouth.    . furosemide (LASIX) 40 MG tablet Take 40 mg by mouth daily.    . insulin degludec (TRESIBA FLEXTOUCH) 100 UNIT/ML SOPN FlexTouch Pen Inject 26 Units into the skin daily.     Marland Kitchen levETIRAcetam (KEPPRA) 500 MG tablet Take 500 mg by mouth 2 (two) times daily.    Marland Kitchen levothyroxine (SYNTHROID, LEVOTHROID) 100 MCG tablet Take 100 mcg by mouth daily before breakfast.    . loratadine (CLARITIN) 10 MG tablet Take 10 mg by mouth daily as needed for allergies.    . metoprolol (TOPROL-XL) 200 MG 24 hr tablet Take 100 mg by mouth at bedtime.     . montelukast (SINGULAIR) 10 MG tablet Take 10 mg at bedtime by mouth.    . Multiple Vitamin (MULTIVITAMIN WITH MINERALS) TABS tablet Take 1 tablet daily by mouth.    . nateglinide (STARLIX) 120 MG tablet Take 120 mg by mouth daily.     Marland Kitchen omeprazole (PRILOSEC) 40 MG capsule Take 40 mg by mouth 2 (two) times daily.    . OXYGEN Inhale 3 L/min into the lungs.    Marland Kitchen OZEMPIC, 0.25 OR 0.5 MG/DOSE, 2 MG/1.5ML SOPN INJECT 0.5 MG WEEKLY 4.5 mL 4  . Rivaroxaban (XARELTO) 15 MG TABS tablet Take 15 mg by mouth daily.    Marland Kitchen spironolactone (ALDACTONE) 25 MG tablet  Take 12.5 mg by mouth daily.     No current facility-administered medications for this encounter.    Allergies  Allergen Reactions  . Allopurinol Nausea And Vomiting  . Aspirin     Causes jitters      Social History   Socioeconomic History  . Marital status: Married    Spouse name: Not on file  . Number of children: 1  . Years of education: Not on file  . Highest education level: Not on file  Occupational  History  . Occupation: retired  Tobacco Use  . Smoking status: Former Smoker    Packs/day: 1.00    Years: 30.00    Pack years: 30.00    Types: Cigarettes    Quit date: 1995    Years since quitting: 26.9  . Smokeless tobacco: Never Used  . Tobacco comment: "quit smoking cigarettes in the 1990s"  Vaping Use  . Vaping Use: Never used  Substance and Sexual Activity  . Alcohol use: No  . Drug use: No  . Sexual activity: Not Currently  Other Topics Concern  . Not on file  Social History Narrative  . Not on file   Social Determinants of Health   Financial Resource Strain: Low Risk   . Difficulty of Paying Living Expenses: Not hard at all  Food Insecurity: No Food Insecurity  . Worried About Charity fundraiser in the Last Year: Never true  . Ran Out of Food in the Last Year: Never true  Transportation Needs: No Transportation Needs  . Lack of Transportation (Medical): No  . Lack of Transportation (Non-Medical): No  Physical Activity: Insufficiently Active  . Days of Exercise per Week: 4 days  . Minutes of Exercise per Session: 30 min  Stress: No Stress Concern Present  . Feeling of Stress : Not at all  Social Connections: Moderately Isolated  . Frequency of Communication with Friends and Family: More than three times a week  . Frequency of Social Gatherings with Friends and Family: Never  . Attends Religious Services: Never  . Active Member of Clubs or Organizations: No  . Attends Archivist Meetings: Never  . Marital Status: Married  Arboriculturist Violence: Not At Risk  . Fear of Current or Ex-Partner: No  . Emotionally Abused: No  . Physically Abused: No  . Sexually Abused: No      Family History  Problem Relation Age of Onset  . Diabetes Other   . Breast cancer Sister   . Liver cancer Mother   . Uterine cancer Mother   . Hypertension Mother   . Colon cancer Maternal Grandmother   . Breast cancer Cousin        couple  . Kidney disease Sister   . Diabetes Sister     Vitals:   06/14/20 1031  BP: 128/70  Pulse: 77  SpO2: 95%  Weight: 71.1 kg (156 lb 12.8 oz)    PHYSICAL EXAM: General:  Sitting in chair. No resp difficulty on O2 HEENT: normal Neck: supple. no JVD. Carotids 2+ bilat; no bruits. No lymphadenopathy or thryomegaly appreciated. Cor: PMI nondisplaced. Regular rate & rhythm. Prominent P2.  No rubs, gallops or murmurs. Lungs: clear. Mildly decreased throughout Abdomen: soft, nontender, nondistended. No hepatosplenomegaly. No bruits or masses. Good bowel sounds. Extremities: no cyanosis, clubbing, rash, edema Neuro: alert & orientedx3, cranial nerves grossly intact. moves all 4 extremities w/o difficulty. Affect pleasant    ASSESSMENT & PLAN:  1. Pulmonary HTN with cor pulmonale, severe  - Echo 7/21: LVEF 65-70% with restrictive diastolic filling  RV moderately enlarged with moderate HK. Mod to severe TR. Severe septal flattening RVSP estimated at 108.  - CT abdomen 2/21: showed calcified granuloma in RLL - VQ scan 8/21: Normal - RHC 11/21 with severe PAH PA =  92/36 (56) PCW = 11 CI 1.8 PVR 15. I suspect predominantly WHO Group III but given how high pressures are may also have WHO Group I disease.  - Continue supplemental O2 -  PFTs pending - would like to see these prior to initiating [place] therapy. If severe may start with inhaled Tyvaso otherwise will use macitentan - Need to restart CPAP (needs repeat sleep study) - Auto-immune serology negative  2. Chronic hypoxic respiratory failure -  quit tobacco 1995 - on home O2 at 3L - has h/o tracheostomy - PFTs pending  3. OSA - need to restart CPAP  - will repeat sleep study as not done for many years. This has been ordered but not performed. Will f/u   Glori Bickers, MD  11:12 AM

## 2020-06-14 ENCOUNTER — Encounter (HOSPITAL_COMMUNITY): Payer: Self-pay | Admitting: Internal Medicine

## 2020-06-14 ENCOUNTER — Ambulatory Visit (HOSPITAL_COMMUNITY)
Admission: RE | Admit: 2020-06-14 | Discharge: 2020-06-14 | Disposition: A | Discharge status: Home / Self Care | Payer: Medicare PPO | Source: Ambulatory Visit | Attending: Internal Medicine | Admitting: Internal Medicine

## 2020-06-14 ENCOUNTER — Other Ambulatory Visit: Payer: Self-pay

## 2020-06-14 ENCOUNTER — Ambulatory Visit: Payer: Medicare PPO | Admitting: Physical Therapy

## 2020-06-14 ENCOUNTER — Encounter: Payer: Self-pay | Admitting: Physical Therapy

## 2020-06-14 VITALS — BP 128/70 | HR 77 | Wt 156.8 lb

## 2020-06-14 DIAGNOSIS — G40909 Epilepsy, unspecified, not intractable, without status epilepticus: Secondary | ICD-10-CM | POA: Insufficient documentation

## 2020-06-14 DIAGNOSIS — Z79899 Other long term (current) drug therapy: Secondary | ICD-10-CM | POA: Insufficient documentation

## 2020-06-14 DIAGNOSIS — Z794 Long term (current) use of insulin: Secondary | ICD-10-CM | POA: Diagnosis not present

## 2020-06-14 DIAGNOSIS — J9611 Chronic respiratory failure with hypoxia: Secondary | ICD-10-CM | POA: Diagnosis not present

## 2020-06-14 DIAGNOSIS — I110 Hypertensive heart disease with heart failure: Secondary | ICD-10-CM | POA: Diagnosis not present

## 2020-06-14 DIAGNOSIS — Z87891 Personal history of nicotine dependence: Secondary | ICD-10-CM | POA: Diagnosis not present

## 2020-06-14 DIAGNOSIS — Z888 Allergy status to other drugs, medicaments and biological substances status: Secondary | ICD-10-CM | POA: Diagnosis not present

## 2020-06-14 DIAGNOSIS — R2689 Other abnormalities of gait and mobility: Secondary | ICD-10-CM

## 2020-06-14 DIAGNOSIS — Z9981 Dependence on supplemental oxygen: Secondary | ICD-10-CM | POA: Insufficient documentation

## 2020-06-14 DIAGNOSIS — Z7901 Long term (current) use of anticoagulants: Secondary | ICD-10-CM | POA: Diagnosis not present

## 2020-06-14 DIAGNOSIS — I272 Pulmonary hypertension, unspecified: Secondary | ICD-10-CM

## 2020-06-14 DIAGNOSIS — J9601 Acute respiratory failure with hypoxia: Secondary | ICD-10-CM

## 2020-06-14 DIAGNOSIS — G4733 Obstructive sleep apnea (adult) (pediatric): Secondary | ICD-10-CM | POA: Insufficient documentation

## 2020-06-14 DIAGNOSIS — Z79890 Hormone replacement therapy (postmenopausal): Secondary | ICD-10-CM | POA: Diagnosis not present

## 2020-06-14 DIAGNOSIS — M545 Low back pain, unspecified: Secondary | ICD-10-CM

## 2020-06-14 DIAGNOSIS — Z9181 History of falling: Secondary | ICD-10-CM

## 2020-06-14 DIAGNOSIS — M6281 Muscle weakness (generalized): Secondary | ICD-10-CM | POA: Diagnosis not present

## 2020-06-14 DIAGNOSIS — Z86718 Personal history of other venous thrombosis and embolism: Secondary | ICD-10-CM | POA: Insufficient documentation

## 2020-06-14 DIAGNOSIS — I5032 Chronic diastolic (congestive) heart failure: Secondary | ICD-10-CM | POA: Insufficient documentation

## 2020-06-14 DIAGNOSIS — Z86711 Personal history of pulmonary embolism: Secondary | ICD-10-CM | POA: Insufficient documentation

## 2020-06-14 DIAGNOSIS — R262 Difficulty in walking, not elsewhere classified: Secondary | ICD-10-CM

## 2020-06-14 DIAGNOSIS — M62830 Muscle spasm of back: Secondary | ICD-10-CM

## 2020-06-14 DIAGNOSIS — Z886 Allergy status to analgesic agent status: Secondary | ICD-10-CM | POA: Insufficient documentation

## 2020-06-14 DIAGNOSIS — R5381 Other malaise: Secondary | ICD-10-CM

## 2020-06-14 DIAGNOSIS — Z8249 Family history of ischemic heart disease and other diseases of the circulatory system: Secondary | ICD-10-CM | POA: Insufficient documentation

## 2020-06-14 NOTE — Progress Notes (Signed)
Height: 5'1"    Weight: 156 lb BMI: 29.6  Today's Date: 06/14/20  STOP BANG RISK ASSESSMENT S (snore) Have you been told that you snore?     YES   T (tired) Are you often tired, fatigued, or sleepy during the day?   YES  O (obstruction) Do you stop breathing, choke, or gasp during sleep? NO   P (pressure) Do you have or are you being treated for high blood pressure? YES   B (BMI) Is your body index greater than 35 kg/m? NO   A (age) Are you 79 years old or older? YES   N (neck) Do you have a neck circumference greater than 16 inches?      G (gender) Are you a female? NO   TOTAL STOP/BANG "YES" ANSWERS 4                                                                       For Office Use Only              Procedure Order Form    YES to 3+ Stop Bang questions OR two clinical symptoms - patient qualifies for WatchPAT (CPT 95800)      Clinical Notes: Will consult Sleep Specialist and refer for management of therapy due to patient increased risk of Sleep Apnea. Ordering a sleep study due to the following two clinical symptoms: Excessive daytime sleepiness G47.10 / Loud snoring R06.83

## 2020-06-14 NOTE — Therapy (Signed)
[place]. [place], [name], [address] Phone: [phone]   Fax:  [phone]  Physical Therapy Treatment  Patient Details  Name: Amanda Short MRN: [id number] Date of Birth: October 30, 1940 Referring Provider (PT): Rennard   Encounter Date: 06/14/2020   PT End of Session - 06/14/20 1408    Visit Number 43    Date for PT Re-Evaluation 06/18/20    Authorization Type Hum[employer]    PT Start Time 7673    PT Stop Time 1348    PT Time Calculation (min) 43 min    Activity Tolerance Patient tolerated treatment well    Behavior During Therapy [facility] for tasks assessed/performed           Past Medical History:  Diagnosis Date  . Abdominal pain   . Abnormal CT of the abdomen   . Acute blood loss anemia   . Acute cystitis with hematuria 12/19/2019  . Acute encephalopathy 06/06/2017  . Acute pulmonary embolism ([place]) 04/01/2013  . Acute respiratory failure with hypoxia ([place])   . Anxiety state   . Arthritis    "back, hands" (07/22/2015)  . Aspiration pneumonia ([place])   . Benign essential HTN   . Cancer of left breast ([place]) 2006   S/P lumpectomy  . Cellulitis of foot, right 07/21/2015  . Cellulitis of right foot 07/21/2015  . Central retinal vein occlusion of right eye 04/20/2016  . Chronic diastolic CHF (congestive heart failure) ([place]) 07/10/2017  . Colitis 05/27/2017  . Complication of anesthesia    "brief breathing problem at surgery center in ~ 2005 when I had gallbladder OR"  . Concussion 03/2013   due to fall  . Depression   . Diabetes ([place]) 04/01/2013  . 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 Diabetes mellitus type 2, controlled ([place]) 07/21/2015  . Diabetic foot infection ([place]) 07/21/2015  . Diverticulitis of colon   . Diverticulitis of large intestine without perforation or abscess without bleeding   . Diverticulosis   . DVT (deep venous thrombosis) ([place]) 03/2013   LLE  . Encephalopathy   . Encounter for  attention to tracheostomy ([place])   . Encounter for central line placement   . Encounter for orogastric (OG) tube placement   . Essential hypertension, benign 04/01/2013  . GERD (gastroesophageal reflux disease)   . History of acute respiratory failure   . History of pulmonary embolism   . HX: breast cancer 04/01/2013  . Hyperlipidemia   . Hypertension   . Hypocalcemia 05/27/2017  . Hypokalemia 07/10/2017  . Hypomagnesemia 07/11/2017  . Hypothyroidism   . Hypoxia 03/23/2020  . Nausea vomiting and diarrhea 07/10/2017  . Non-intractable cyclical vomiting with nausea   . Nuclear sclerotic cataract of both eyes 04/20/2016  . OSA (obstructive sleep apnea) 07/10/2017  . OSA on CPAP 04/01/2013  . OSA treated with BiPAP   . Physical debility 06/23/2017  . 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 Pulmonary embolism ([place]) 03/2013  . Pulmonary HTN ([place]) 07/05/2017  . Seizure ([place]) 07/10/2017  . Seizure disorder ([place]) 07/05/2017  . Seizures ([place])   . Sepsis ([place]) 07/10/2017  . Sleep apnea   . Status epilepticus ([place])   . 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 Tachypnea   . Thyroid disease    Hypothyroid  . Tracheostomy, acute management ([place])   . Type II diabetes mellitus ([place])     Past Surgical History:  Procedure Laterality Date  . ABDOMINAL HYSTERECTOMY  1970s  .  BREAST BIOPSY Left 2006  . BREAST LUMPECTOMY Left 2006  . BUNIONECTOMY WITH HAMMERTOE RECONSTRUCTION Left   . ESOPHAGOGASTRODUODENOSCOPY N/A 05/29/2017   Procedure: ESOPHAGOGASTRODUODENOSCOPY (EGD);  Surgeon: Irene Shipper, MD;  Location[facility];  Service: Endoscopy;  Laterality: N/A;  . JOINT REPLACEMENT    . LAPAROSCOPIC CHOLECYSTECTOMY  ~ 2005  . RIGHT/LEFT HEART CATH AND CORONARY ANGIOGRAPHY N/A 05/24/2020   Procedure: RIGHT/LEFT HEART CATH AND CORONARY ANGIOGRAPHY;  Surgeon: Jolaine Artist, MD;  Location: [facility];  Service: Cardiovascular;  Laterality: N/A;  . TOTAL HIP ARTHROPLASTY Left 2010  . TOTAL  KNEE ARTHROPLASTY Bilateral 2005-2006    There were no vitals filed for this visit.   Subjective Assessment - 06/14/20 1312    Subjective Patient saw the cardiologist today, will have further testing and will hopefully find a treatment in the near future    Currently in Pain? No/denies                             [facility] Adult PT Treatment/Exercise - 06/14/20 0001      Ambulation/Gait   Gait Comments gait 90 feet without rest, then tried to go another 90 feet and she needed 4 rests to complete, c/o SOB and fatigue, O2 was 90%, did two more 90' walks with light HHA      High Level Balance   High Level Balance Comments on airex two way scap stabiliation, then reaching and head turns, occasional loss of balance that required Mod A to right      Lumbar Exercises: Aerobic   Nustep Level 5 300 steps, really had to give a lot of cues to get her to speed up,, took 7 minutes      Lumbar Exercises: Machines for Strengthening   Cybex Knee Extension 10# 3x10    Cybex Knee Flexion 25# 2x15      Lumbar Exercises: Standing   Other Standing Lumbar Exercises 3# marching, extension and hip abduction and HS curls with walker 2x10       Lumbar Exercises: Seated   Other Seated Lumbar Exercises red tband ankle exercises                    PT Short Term Goals - 12/04/19 1627      PT SHORT TERM GOAL #1   Title independent with intial HEP    Status Achieved             PT Long Term Goals - 06/14/20 1412      PT LONG TERM GOAL #1   Title increase overall hip LE strength for functional gait and safety hips and ankles to 4+/5    Status Partially Met      PT LONG TERM GOAL #2   Title decrease TUG time to less than 14 seconds for functional gait and safety    Status Partially Met      PT LONG TERM GOAL #3   Title increase BERG to 50/56 for safety and independence    Status Partially Met      PT LONG TERM GOAL #4   Title decrease LBP 50% with activities     Status Partially Met      PT LONG TERM GOAL #5   Title increase lumbar ROM 25%    Status Achieved                 Plan - 06/14/20 1409  Clinical Impression Statement Patient has a limit of about 90 feet of ambulation before she rests.  She really tends to be unable to walk much more than that due to fatigue in the legs back and due to shourtness of breath.  She has improved with her pain levels and her timed up and go and her Berg balance test, she is still really struggling with function and quality of life due to the limitations of her lungs and heart, the cardiologist thinks that PT can help, she will undergo further testing in the next month to see what medically can be done to help    PT Next Visit Plan write renewal    Consulted and Agree with Plan of Care Patient           Patient will benefit from skilled therapeutic intervention in order to improve the following deficits and impairments:  Abnormal gait, Decreased range of motion, Difficulty walking, Increased muscle spasms, Decreased activity tolerance, Pain, Decreased balance, Impaired flexibility, Improper body mechanics, Postural dysfunction, Decreased strength, Decreased mobility  Visit Diagnosis: Muscle weakness (generalized)  Risk for falls  Difficulty in walking, not elsewhere classified  Physical debility  Acute bilateral low back pain without sciatica  Other abnormalities of gait and mobility  Muscle spasm of back     Problem List Patient Active Problem List   Diagnosis Date Noted  . Type II diabetes mellitus ([place])   . Thyroid disease   . Sleep apnea   . Seizures ([place])   . OSA treated with BiPAP   . Hypertension   . Hyperlipidemia   . GERD (gastroesophageal reflux disease)   . Diverticulosis   . Depression   . Complication of anesthesia   . Arthritis   . Hypoxia 03/23/2020  . Acute cystitis with hematuria 12/19/2019  . Hypomagnesemia 07/11/2017  . Nausea vomiting and diarrhea  07/10/2017  . Hypokalemia 07/10/2017  . Chronic diastolic CHF (congestive heart failure) ([place]) 07/10/2017  . Seizure ([place]) 07/10/2017  . OSA (obstructive sleep apnea) 07/10/2017  . Sepsis ([place]) 07/10/2017  . Seizure disorder ([place]) 07/05/2017  . Pulmonary HTN ([place]) 07/05/2017  . Anxiety state   . Physical debility 06/23/2017  . 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 History of acute respiratory failure   . Tracheostomy, acute management ([place])   . Aspiration pneumonia ([place])   . Encephalopathy   . Status epilepticus ([place])   . Benign essential HTN   . 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 History of pulmonary embolism   . Acute blood loss anemia   . Diverticulitis of colon   . Tachypnea   . Encounter for orogastric (OG) tube placement   . Encounter for central line placement   . Encounter for attention to tracheostomy ([place])   . Acute respiratory failure with hypoxia ([place])   . Acute encephalopathy 06/06/2017  . Diverticulitis of large intestine without perforation or abscess without bleeding   . Abdominal pain   . Non-intractable cyclical vomiting with nausea   . Hypocalcemia 05/27/2017  . 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 Colitis 05/27/2017  . Central retinal vein occlusion of right eye 04/20/2016  . Nuclear sclerotic cataract of both eyes 04/20/2016  . Diabetic foot infection ([place]) 07/21/2015  . Diabetes mellitus type 2, controlled ([place]) 07/21/2015  . Cellulitis of foot, right 07/21/2015  . Cellulitis of right foot 07/21/2015  . Acute pulmonary embolism ([place]) 04/01/2013  . DVT (deep venous thrombosis) ([place]) 04/01/2013  . HX: breast  cancer 04/01/2013  . Concussion 04/01/2013  . Diabetes ([place]) 04/01/2013  . Essential hypertension, benign 04/01/2013  . Hypothyroidism 04/01/2013  . OSA on CPAP 04/01/2013  . Pulmonary embolism ([place]) 03/2013  . Cancer of left breast [facility]) 2006    [name] Boast., PT 06/14/2020, 2:19 PM  [place]. [place], [name], [address] Phone: [phone]   Fax:  [phone]  Name: Amanda Short MRN: [id number] Date of Birth: [birth year]

## 2020-06-14 NOTE — Addendum Note (Signed)
Encounter addended by: Scarlette Calico, RN on: 06/14/2020 11:32 AM  Actions taken: Clinical Note Signed

## 2020-06-14 NOTE — Patient Instructions (Signed)
Your physician has recommended that you have a pulmonary function test. Pulmonary Function Tests are a group of tests that measure how well air moves in and out of your lungs.  We are still in the process of getting your insurance to cover the home sleep study, we will let you know when this is approved and have you come in to get the equipment.  Please have your Primary Care provider reorder your Physical Therapy  Your physician recommends that you schedule a follow-up appointment in: 2-3 weeks with a TELEHEALTH VISIT  If you have any questions or concerns before your next appointment please send [place] a message through [place] or call our office at [phone].    TO LEAVE A MESSAGE FOR THE NURSE SELECT OPTION 2, PLEASE LEAVE A MESSAGE INCLUDING: . YOUR NAME . DATE OF BIRTH . CALL BACK NUMBER . REASON FOR CALL**this is important as we prioritize the call backs  [place] AS LONG AS YOU CALL BEFORE 4:00 PM  At the [facility], you and your health needs are our priority. As part of our continuing mission to provide you with exceptional heart care, we have created designated Provider Care Teams. These Care Teams include your primary Cardiologist (physician) and Advanced Practice Providers (APPs- Physician Assistants and Nurse Practitioners) who all work together to provide you with the care you need, when you need it.   You may see any of the following providers on your designated Care Team at your next follow up: Marland Kitchen Dr Glori Bickers . Dr Loralie Champagne . Darrick Grinder, NP . Lyda Jester, PA . Audry Riles, PharmD   Please be sure to bring in all your medications bottles to every appointment.

## 2020-06-16 ENCOUNTER — Ambulatory Visit: Payer: Medicare PPO

## 2020-06-16 ENCOUNTER — Other Ambulatory Visit: Payer: Medicare PPO

## 2020-06-16 ENCOUNTER — Encounter (HOSPITAL_COMMUNITY): Payer: Self-pay

## 2020-06-16 HISTORY — PX: CARDIAC CATHETERIZATION: SHX172

## 2020-06-17 ENCOUNTER — Other Ambulatory Visit: Payer: Self-pay

## 2020-06-17 ENCOUNTER — Ambulatory Visit: Payer: Medicare PPO | Attending: Family Medicine | Admitting: Physical Therapy

## 2020-06-17 ENCOUNTER — Encounter: Payer: Self-pay | Admitting: Physical Therapy

## 2020-06-17 DIAGNOSIS — M62830 Muscle spasm of back: Secondary | ICD-10-CM | POA: Insufficient documentation

## 2020-06-17 DIAGNOSIS — M6281 Muscle weakness (generalized): Secondary | ICD-10-CM | POA: Diagnosis not present

## 2020-06-17 DIAGNOSIS — M545 Low back pain, unspecified: Secondary | ICD-10-CM | POA: Insufficient documentation

## 2020-06-17 DIAGNOSIS — R5381 Other malaise: Secondary | ICD-10-CM | POA: Diagnosis present

## 2020-06-17 DIAGNOSIS — R2689 Other abnormalities of gait and mobility: Secondary | ICD-10-CM | POA: Diagnosis present

## 2020-06-17 DIAGNOSIS — R262 Difficulty in walking, not elsewhere classified: Secondary | ICD-10-CM | POA: Insufficient documentation

## 2020-06-17 DIAGNOSIS — Z9181 History of falling: Secondary | ICD-10-CM

## 2020-06-17 NOTE — Therapy (Signed)
[place]. [place], [name], [address] Phone: [phone]   Fax:  [phone]  Physical Therapy Treatment  Patient Details  Name: Amanda Short MRN: [id number] Date of Birth: 1940/12/29 Referring Provider (PT): Rennard   Encounter Date: 06/17/2020   PT End of Session - 06/17/20 1356    Visit Number 32    Date for PT Re-Evaluation 06/18/20    Authorization Type [employer]    PT Start Time 1304    PT Stop Time 1354    PT Time Calculation (min) 50 min    Activity Tolerance Patient tolerated treatment well    Behavior During Therapy [facility] for tasks assessed/performed           Past Medical History:  Diagnosis Date  . Abdominal pain   . Abnormal CT of the abdomen   . Acute blood loss anemia   . Acute cystitis with hematuria 12/19/2019  . Acute encephalopathy 06/06/2017  . Acute pulmonary embolism ([place]) 04/01/2013  . Acute respiratory failure with hypoxia ([place])   . Anxiety state   . Arthritis    "back, hands" (07/22/2015)  . Aspiration pneumonia ([place])   . Benign essential HTN   . Cancer of left breast ([place]) 2006   S/P lumpectomy  . Cellulitis of foot, right 07/21/2015  . Cellulitis of right foot 07/21/2015  . Central retinal vein occlusion of right eye 04/20/2016  . Chronic diastolic CHF (congestive heart failure) ([place]) 07/10/2017  . Colitis 05/27/2017  . Complication of anesthesia    "brief breathing problem at surgery center in ~ 2005 when I had gallbladder OR"  . Concussion 03/2013   due to fall  . Depression   . Diabetes ([place]) 04/01/2013  . 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 Diabetes mellitus type 2, controlled ([place]) 07/21/2015  . Diabetic foot infection ([place]) 07/21/2015  . Diverticulitis of colon   . Diverticulitis of large intestine without perforation or abscess without bleeding   . Diverticulosis   . DVT (deep venous thrombosis) ([place]) 03/2013   LLE  . Encephalopathy   . Encounter for  attention to tracheostomy ([place])   . Encounter for central line placement   . Encounter for orogastric (OG) tube placement   . Essential hypertension, benign 04/01/2013  . GERD (gastroesophageal reflux disease)   . History of acute respiratory failure   . History of pulmonary embolism   . HX: breast cancer 04/01/2013  . Hyperlipidemia   . Hypertension   . Hypocalcemia 05/27/2017  . Hypokalemia 07/10/2017  . Hypomagnesemia 07/11/2017  . Hypothyroidism   . Hypoxia 03/23/2020  . Nausea vomiting and diarrhea 07/10/2017  . Non-intractable cyclical vomiting with nausea   . Nuclear sclerotic cataract of both eyes 04/20/2016  . OSA (obstructive sleep apnea) 07/10/2017  . OSA on CPAP 04/01/2013  . OSA treated with BiPAP   . Physical debility 06/23/2017  . 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 Pulmonary embolism ([place]) 03/2013  . Pulmonary HTN ([place]) 07/05/2017  . Seizure ([place]) 07/10/2017  . Seizure disorder ([place]) 07/05/2017  . Seizures ([place])   . Sepsis ([place]) 07/10/2017  . Sleep apnea   . Status epilepticus ([place])   . 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 Tachypnea   . Thyroid disease    Hypothyroid  . Tracheostomy, acute management ([place])   . Type II diabetes mellitus ([place])     Past Surgical History:  Procedure Laterality Date  . ABDOMINAL HYSTERECTOMY  1970s  .  BREAST BIOPSY Left 2006  . BREAST LUMPECTOMY Left 2006  . BUNIONECTOMY WITH HAMMERTOE RECONSTRUCTION Left   . ESOPHAGOGASTRODUODENOSCOPY N/A 05/29/2017   Procedure: ESOPHAGOGASTRODUODENOSCOPY (EGD);  Surgeon: Irene Shipper, MD;  [facility];  Service: Endoscopy;  Laterality: N/A;  . JOINT REPLACEMENT    . LAPAROSCOPIC CHOLECYSTECTOMY  ~ 2005  . RIGHT/LEFT HEART CATH AND CORONARY ANGIOGRAPHY N/A 05/24/2020   Procedure: RIGHT/LEFT HEART CATH AND CORONARY ANGIOGRAPHY;  Surgeon: Jolaine Artist, MD;  Location: [facility];  Service: Cardiovascular;  Laterality: N/A;  . TOTAL HIP ARTHROPLASTY Left 2010  . TOTAL  KNEE ARTHROPLASTY Bilateral 2005-2006    There were no vitals filed for this visit.   Subjective Assessment - 06/17/20 1307    Subjective Patient will have some further testing for further treatment options for lungs vs heart    Currently in Pain? No/denies              [facility] PT Assessment - 06/17/20 0001      Ambulation/Gait   Gait Comments worked more with gait and trying to have a good pace., today did two 90 feet without rest and with minimal difficulty, did 120 feet without minimal difficlty and then did another 90 feet and O2 saturation  dropped to 82%.      Timed Up and Go Test   Normal TUG (seconds) 12                         [facility] Adult PT Treatment/Exercise - 06/17/20 0001      High Level Balance   High Level Balance Comments airex balance beam, side stepping,       Lumbar Exercises: Aerobic   Recumbent Bike 5 minutes level 1 encouragement to keep moving, did require rests at times    Nustep Level 5 goal 300 steps, cues for some speed as she tends to go very slow                    PT Short Term Goals - 12/04/19 1627      PT SHORT TERM GOAL #1   Title independent with intial HEP    Status Achieved             PT Long Term Goals - 06/17/20 1438      PT LONG TERM GOAL #1   Title increase overall hip LE strength for functional gait and safety hips and ankles to 4+/5    Status Partially Met      PT LONG TERM GOAL #2   Title decrease TUG time to less than 14 seconds for functional gait and safety    Status Achieved      PT LONG TERM GOAL #3   Title increase BERG to 50/56 for safety and independence    Status Partially Met      PT LONG TERM GOAL #4   Title decrease LBP 50% with activities    Status Partially Met      PT LONG TERM GOAL #5   Title increase lumbar ROM 25%    Status Achieved                 Plan - 06/17/20 1330    Clinical Impression Statement Patient has been having a little more difficulty with  her SOB and fatigue.  After 90 feet of walking requires rest, she had O2 drop to 82% with some walking today , this was  the lowest I have seen and she is on 3L oxygen.  She reports that she feels that she is getting better as she was able to go shoppin with difficulkty but she was able to do what she wanted.  Pain levels are better, timed up and go and Berg balance are better which will make her safer, her strength, endurance function have acturally dropped some with her breathing and heart issues, the MD's following her feel that she needs to continue PT.  She will have a few more tests and the hope will be to have a treatment plan and will help [place] with her function    PT Frequency 1x / week    PT Duration 6 weeks    PT Treatment/Interventions ADLs/Self Care Home Management;Electrical Stimulation;Moist Heat;Traction;Gait training;Neuromuscular re-education;Balance training;Therapeutic exercise;Therapeutic activities;Functional mobility training;Stair training;Patient/family education;Manual techniques    PT Next Visit Plan would like to continue after her testing to see if we can get her to a higher functional level and quality of life    Consulted and Agree with Plan of Care Patient           Patient will benefit from skilled therapeutic intervention in order to improve the following deficits and impairments:  Abnormal gait, Decreased range of motion, Difficulty walking, Increased muscle spasms, Decreased activity tolerance, Pain, Decreased balance, Impaired flexibility, Improper body mechanics, Postural dysfunction, Decreased strength, Decreased mobility  Visit Diagnosis: Muscle weakness (generalized)  Risk for falls  Difficulty in walking, not elsewhere classified  Physical debility  Acute bilateral low back pain without sciatica  Other abnormalities of gait and mobility  Muscle spasm of back     Problem List Patient Active Problem List   Diagnosis Date Noted  . Type II diabetes  mellitus ([place]   . Thyroid disease   . Sleep apnea   . Seizures ([place])   . OSA treated with BiPAP   . Hypertension   . Hyperlipidemia   . GERD (gastroesophageal reflux disease)   . Diverticulosis   . Depression   . Complication of anesthesia   . Arthritis   . Hypoxia 03/23/2020  . Acute cystitis with hematuria 12/19/2019  . Hypomagnesemia 07/11/2017  . Nausea vomiting and diarrhea 07/10/2017  . Hypokalemia 07/10/2017  . Chronic diastolic CHF (congestive heart failure) ([place]) 07/10/2017  . Seizure ([place]) 07/10/2017  . OSA (obstructive sleep apnea) 07/10/2017  . Sepsis ([place]) 07/10/2017  . Seizure disorder ([place]) 07/05/2017  . Pulmonary HTN ([place]) 07/05/2017  . Anxiety state   . Physical debility 06/23/2017  . 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 History of acute respiratory failure   . Tracheostomy, acute management ([place])   . Aspiration pneumonia ([place])   . Encephalopathy   . Status epilepticus ([place])   . Benign essential HTN   . 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 History of pulmonary embolism   . Acute blood loss anemia   . Diverticulitis of colon   . Tachypnea   . Encounter for orogastric (OG) tube placement   . Encounter for central line placement   . Encounter for attention to tracheostomy ([place])   . Acute respiratory failure with hypoxia ([place])   . Acute encephalopathy 06/06/2017  . Diverticulitis of large intestine without perforation or abscess without bleeding   . Abdominal pain   . Non-intractable cyclical vomiting with nausea   . Hypocalcemia 05/27/2017  . 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 Colitis 05/27/2017  . Central retinal vein occlusion of right  eye 04/20/2016  . Nuclear sclerotic cataract of both eyes 04/20/2016  . Diabetic foot infection ([place]) 07/21/2015  . Diabetes mellitus type 2, controlled ([place]) 07/21/2015  . Cellulitis of foot, right 07/21/2015  . Cellulitis of right foot 07/21/2015  . Acute pulmonary embolism ([place]) 04/01/2013  . DVT  (deep venous thrombosis) ([place]) 04/01/2013  . HX: breast cancer 04/01/2013  . Concussion 04/01/2013  . Diabetes ([place]) 04/01/2013  . Essential hypertension, benign 04/01/2013  . Hypothyroidism 04/01/2013  . OSA on CPAP 04/01/2013  . Pulmonary embolism ([place]) 03/2013  . Cancer of left breast [facility]) 2006    [name] Boast., PT 06/17/2020, 2:40 PM  [place]. [place], [name], [address] Phone: [phone]   Fax:  [phone]  Name: Amanda Short MRN: [id number] Date of Birth: 18-Nov-1940

## 2020-06-18 ENCOUNTER — Other Ambulatory Visit (HOSPITAL_COMMUNITY)
Admission: RE | Admit: 2020-06-18 | Discharge: 2020-06-18 | Disposition: A | Discharge status: Home / Self Care | Payer: Medicare PPO | Source: Ambulatory Visit | Attending: Internal Medicine | Admitting: Internal Medicine

## 2020-06-18 DIAGNOSIS — Z01812 Encounter for preprocedural laboratory examination: Secondary | ICD-10-CM | POA: Diagnosis present

## 2020-06-18 DIAGNOSIS — Z20822 Contact with and (suspected) exposure to covid-19: Secondary | ICD-10-CM | POA: Diagnosis not present

## 2020-06-18 LAB — SARS CORONAVIRUS 2 (TAT 6-24 HRS): SARS Coronavirus 2: NEGATIVE

## 2020-06-21 ENCOUNTER — Ambulatory Visit (HOSPITAL_COMMUNITY)
Admission: RE | Admit: 2020-06-21 | Discharge: 2020-06-21 | Disposition: A | Discharge status: Home / Self Care | Payer: Medicare PPO | Source: Ambulatory Visit | Attending: Internal Medicine | Admitting: Internal Medicine

## 2020-06-21 ENCOUNTER — Other Ambulatory Visit: Payer: Self-pay

## 2020-06-21 DIAGNOSIS — I272 Pulmonary hypertension, unspecified: Secondary | ICD-10-CM | POA: Diagnosis not present

## 2020-06-21 LAB — PULMONARY FUNCTION TEST
DL/VA % pred: 52 %
DL/VA: 2.19 ml/min/mmHg/L
DLCO unc % pred: 35 %
DLCO unc: 6.11 ml/min/mmHg
FEF 25-75 Post: 0.47 L/sec
FEF 25-75 Pre: 0.61 L/sec
FEF2575-%Change-Post: -23 %
FEF2575-%Pred-Post: 36 %
FEF2575-%Pred-Pre: 47 %
FEV1-%Change-Post: -5 %
FEV1-%Pred-Post: 62 %
FEV1-%Pred-Pre: 65 %
FEV1-Post: 1.05 L
FEV1-Pre: 1.11 L
FEV1FVC-%Change-Post: -2 %
FEV1FVC-%Pred-Pre: 91 %
FEV6-%Change-Post: -4 %
FEV6-%Pred-Post: 70 %
FEV6-%Pred-Pre: 73 %
FEV6-Post: 1.53 L
FEV6-Pre: 1.6 L
FEV6FVC-%Change-Post: -1 %
FEV6FVC-%Pred-Post: 101 %
FEV6FVC-%Pred-Pre: 102 %
FVC-%Change-Post: -3 %
FVC-%Pred-Post: 69 %
FVC-%Pred-Pre: 71 %
FVC-Post: 1.6 L
FVC-Pre: 1.65 L
Post FEV1/FVC ratio: 66 %
Post FEV6/FVC ratio: 96 %
Pre FEV1/FVC ratio: 67 %
Pre FEV6/FVC Ratio: 97 %
RV % pred: 94 %
RV: 2.1 L
TLC % pred: 82 %
TLC: 3.8 L

## 2020-06-21 MED ORDER — ALBUTEROL SULFATE (2.5 MG/3ML) 0.083% IN NEBU
2.5000 mg | INHALATION_SOLUTION | Freq: Once | RESPIRATORY_TRACT | Status: AC
  Administered 2020-06-21: 2.5 mg via RESPIRATORY_TRACT

## 2020-06-24 ENCOUNTER — Ambulatory Visit: Payer: Medicare PPO | Admitting: Family Medicine

## 2020-06-29 ENCOUNTER — Encounter: Payer: Self-pay | Admitting: Physical Therapy

## 2020-06-29 ENCOUNTER — Ambulatory Visit: Payer: Medicare PPO | Admitting: Physical Therapy

## 2020-06-29 ENCOUNTER — Other Ambulatory Visit: Payer: Self-pay

## 2020-06-29 DIAGNOSIS — M6281 Muscle weakness (generalized): Secondary | ICD-10-CM

## 2020-06-29 DIAGNOSIS — R5381 Other malaise: Secondary | ICD-10-CM

## 2020-06-29 DIAGNOSIS — M62830 Muscle spasm of back: Secondary | ICD-10-CM

## 2020-06-29 DIAGNOSIS — R2689 Other abnormalities of gait and mobility: Secondary | ICD-10-CM

## 2020-06-29 DIAGNOSIS — M545 Low back pain, unspecified: Secondary | ICD-10-CM

## 2020-06-29 DIAGNOSIS — R262 Difficulty in walking, not elsewhere classified: Secondary | ICD-10-CM

## 2020-06-29 DIAGNOSIS — Z9181 History of falling: Secondary | ICD-10-CM

## 2020-06-29 NOTE — Therapy (Signed)
[place] [place], [name], [address] Phone: [phone]   Fax:  [phone]  Physical Therapy Treatment  Patient Details  Name: Amanda Short MRN: [id number] Date of Birth: Jul 28, 1940 Referring Provider (PT): Rennard   Encounter Date: 06/29/2020   PT End of Session - 06/29/20 1707    Visit Number 45    Number of Visits 50    Date for PT Re-Evaluation 08/20/20    Authorization Type [employer]    PT Start Time 1528    PT Stop Time 1613    PT Time Calculation (min) 45 min    Activity Tolerance Patient tolerated treatment well    Behavior During Therapy [facility] for tasks assessed/performed           Past Medical History:  Diagnosis Date  . Abdominal pain   . Abnormal CT of the abdomen   . Acute blood loss anemia   . Acute cystitis with hematuria 12/19/2019  . Acute encephalopathy 06/06/2017  . Acute pulmonary embolism ([place]) 04/01/2013  . Acute respiratory failure with hypoxia ([place])   . Anxiety state   . Arthritis    "back, hands" (07/22/2015)  . Aspiration pneumonia ([place])   . Benign essential HTN   . Cancer of left breast ([place]) 2006   S/P lumpectomy  . Cellulitis of foot, right 07/21/2015  . Cellulitis of right foot 07/21/2015  . Central retinal vein occlusion of right eye 04/20/2016  . Chronic diastolic CHF (congestive heart failure) ([place]) 07/10/2017  . Colitis 05/27/2017  . Complication of anesthesia    "brief breathing problem at surgery center in ~ 2005 when I had gallbladder OR"  . Concussion 03/2013   due to fall  . Depression   . Diabetes ([place]) 04/01/2013  . 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 Diabetes mellitus type 2, controlled ([place]) 07/21/2015  . Diabetic foot infection ([place]) 07/21/2015  . Diverticulitis of colon   . Diverticulitis of large intestine without perforation or abscess without bleeding   . Diverticulosis   . DVT (deep venous thrombosis) ([place]) 03/2013   LLE  . Encephalopathy    . Encounter for attention to tracheostomy ([place])   . Encounter for central line placement   . Encounter for orogastric (OG) tube placement   . Essential hypertension, benign 04/01/2013  . GERD (gastroesophageal reflux disease)   . History of acute respiratory failure   . History of pulmonary embolism   . HX: breast cancer 04/01/2013  . Hyperlipidemia   . Hypertension   . Hypocalcemia 05/27/2017  . Hypokalemia 07/10/2017  . Hypomagnesemia 07/11/2017  . Hypothyroidism   . Hypoxia 03/23/2020  . Nausea vomiting and diarrhea 07/10/2017  . Non-intractable cyclical vomiting with nausea   . Nuclear sclerotic cataract of both eyes 04/20/2016  . OSA (obstructive sleep apnea) 07/10/2017  . OSA on CPAP 04/01/2013  . OSA treated with BiPAP   . Physical debility 06/23/2017  . 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 Pulmonary embolism ([place]) 03/2013  . Pulmonary HTN ([place]) 07/05/2017  . Seizure ([place]) 07/10/2017  . Seizure disorder ([place]) 07/05/2017  . Seizures ([place])   . Sepsis ([place]) 07/10/2017  . Sleep apnea   . Status epilepticus ([place])   . 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 Tachypnea   . Thyroid disease    Hypothyroid  . Tracheostomy, acute management ([place])   . Type II diabetes mellitus ([place])     Past Surgical History:  Procedure Laterality  Date  . ABDOMINAL HYSTERECTOMY  1970s  . BREAST BIOPSY Left 2006  . BREAST LUMPECTOMY Left 2006  . BUNIONECTOMY WITH HAMMERTOE RECONSTRUCTION Left   . ESOPHAGOGASTRODUODENOSCOPY N/A 05/29/2017   Procedure: ESOPHAGOGASTRODUODENOSCOPY (EGD);  Surgeon: Irene Shipper, MD;  Location: [facility];  Service: Endoscopy;  Laterality: N/A;  . JOINT REPLACEMENT    . LAPAROSCOPIC CHOLECYSTECTOMY  ~ 2005  . RIGHT/LEFT HEART CATH AND CORONARY ANGIOGRAPHY N/A 05/24/2020   Procedure: RIGHT/LEFT HEART CATH AND CORONARY ANGIOGRAPHY;  Surgeon: Jolaine Artist, MD;  Location: [facility];  Service: Cardiovascular;  Laterality: N/A;  . TOTAL HIP ARTHROPLASTY Left  2010  . TOTAL KNEE ARTHROPLASTY Bilateral 2005-2006    There were no vitals filed for this visit.   Subjective Assessment - 06/29/20 1536    Subjective Will call MD on Monday and get results and possible treatment plan,    Currently in Pain? No/denies                             [facility] Adult PT Treatment/Exercise - 06/29/20 0001      Ambulation/Gait   Gait Comments gait 115 without rest with O2 and HHA, gait 120 feet without rest      High Level Balance   High Level Balance Activities Negotitating around obstacles;Negotiating over obstacles;Side stepping;Backward walking    High Level Balance Comments on airex ball toss, on airex yellow tband two way scapular stabilization      Lumbar Exercises: Aerobic   Nustep level 5 x 6 minutes, a lot of cues to speed up      Lumbar Exercises: Machines for Strengthening   Cybex Knee Extension 10# 3x10    Cybex Knee Flexion 25# 2x15                    PT Short Term Goals - 12/04/19 1627      PT SHORT TERM GOAL #1   Title independent with intial HEP    Status Achieved             PT Long Term Goals - 06/17/20 1438      PT LONG TERM GOAL #1   Title increase overall hip LE strength for functional gait and safety hips and ankles to 4+/5    Status Partially Met      PT LONG TERM GOAL #2   Title decrease TUG time to less than 14 seconds for functional gait and safety    Status Achieved      PT LONG TERM GOAL #3   Title increase BERG to 50/56 for safety and independence    Status Partially Met      PT LONG TERM GOAL #4   Title decrease LBP 50% with activities    Status Partially Met      PT LONG TERM GOAL #5   Title increase lumbar ROM 25%    Status Achieved                 Plan - 06/29/20 1708    Clinical Impression Statement Patient with very cold hands today and they are purple in color, had some difficulty getting O2 saturation at first, then after warming up her hand we got it to  read 84%, then after some breathing exercises we were able to get up to 93%.  She was able to walk about 115 feet without rest but was very SOB after this, she  will be seeing or speaking with one of her MD's that ran some recent tests soon and hopefully will have a treatment plan for her, I will continue to work on balance, strength, function and quality of life with her independence    PT Next Visit Plan would like to continue after her testing to see if we can get her to a higher functional level and quality of life    Consulted and Agree with Plan of Care Patient           Patient will benefit from skilled therapeutic intervention in order to improve the following deficits and impairments:  Abnormal gait,Decreased range of motion,Difficulty walking,Increased muscle spasms,Decreased activity tolerance,Pain,Decreased balance,Impaired flexibility,Improper body mechanics,Postural dysfunction,Decreased strength,Decreased mobility  Visit Diagnosis: Muscle weakness (generalized)  Risk for falls  Difficulty in walking, not elsewhere classified  Physical debility  Acute bilateral low back pain without sciatica  Other abnormalities of gait and mobility  Muscle spasm of back     Problem List Patient Active Problem List   Diagnosis Date Noted  . Type II diabetes mellitus ([place])   . Thyroid disease   . Sleep apnea   . Seizures ([place])   . OSA treated with BiPAP   . Hypertension   . Hyperlipidemia   . GERD (gastroesophageal reflux disease)   . Diverticulosis   . Depression   . Complication of anesthesia   . Arthritis   . Hypoxia 03/23/2020  . Acute cystitis with hematuria 12/19/2019  . Hypomagnesemia 07/11/2017  . Nausea vomiting and diarrhea 07/10/2017  . Hypokalemia 07/10/2017  . Chronic diastolic CHF (congestive heart failure) ([place]) 07/10/2017  . Seizure ([place]) 07/10/2017  . OSA (obstructive sleep apnea) 07/10/2017  . Sepsis ([place]) 07/10/2017  . Seizure disorder ([place])  07/05/2017  . Pulmonary HTN ([place]) 07/05/2017  . Anxiety state   . Physical debility 06/23/2017  . 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 History of acute respiratory failure   . Tracheostomy, acute management ([place])   . Aspiration pneumonia ([place])   . Encephalopathy   . Status epilepticus ([place])   . Benign essential HTN   . 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 History of pulmonary embolism   . Acute blood loss anemia   . Diverticulitis of colon   . Tachypnea   . Encounter for orogastric (OG) tube placement   . Encounter for central line placement   . Encounter for attention to tracheostomy ([place])   . Acute respiratory failure with hypoxia ([place])   . Acute encephalopathy 06/06/2017  . Diverticulitis of large intestine without perforation or abscess without bleeding   . Abdominal pain   . Non-intractable cyclical vomiting with nausea   . Hypocalcemia 05/27/2017  . 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 Colitis 05/27/2017  . Central retinal vein occlusion of right eye 04/20/2016  . Nuclear sclerotic cataract of both eyes 04/20/2016  . Diabetic foot infection ([place]) 07/21/2015  . Diabetes mellitus type 2, controlled ([place]) 07/21/2015  . Cellulitis of foot, right 07/21/2015  . Cellulitis of right foot 07/21/2015  . Acute pulmonary embolism ([place]) 04/01/2013  . DVT (deep venous thrombosis) ([place]) 04/01/2013  . HX: breast cancer 04/01/2013  . Concussion 04/01/2013  . Diabetes ([place]) 04/01/2013  . Essential hypertension, benign 04/01/2013  . Hypothyroidism 04/01/2013  . OSA on CPAP 04/01/2013  . Pulmonary embolism ([place]) 03/2013  . Cancer of left breast [facility]) 2006    [name] Boast., PT 06/29/2020, 5:11 PM  [facility]  [place]. [place], [name], [address] Phone: [phone]   Fax:  [phone]  Name: Caroljean Monsivais MRN: [id number] Date of Birth: 07/14/1941

## 2020-07-02 ENCOUNTER — Ambulatory Visit: Payer: Medicare PPO | Admitting: Physical Therapy

## 2020-07-02 ENCOUNTER — Other Ambulatory Visit: Payer: Self-pay

## 2020-07-02 ENCOUNTER — Encounter: Payer: Self-pay | Admitting: Physical Therapy

## 2020-07-02 DIAGNOSIS — M62830 Muscle spasm of back: Secondary | ICD-10-CM

## 2020-07-02 DIAGNOSIS — R262 Difficulty in walking, not elsewhere classified: Secondary | ICD-10-CM

## 2020-07-02 DIAGNOSIS — M6281 Muscle weakness (generalized): Secondary | ICD-10-CM | POA: Diagnosis not present

## 2020-07-02 DIAGNOSIS — R2689 Other abnormalities of gait and mobility: Secondary | ICD-10-CM

## 2020-07-02 DIAGNOSIS — Z9181 History of falling: Secondary | ICD-10-CM

## 2020-07-02 DIAGNOSIS — R5381 Other malaise: Secondary | ICD-10-CM

## 2020-07-02 DIAGNOSIS — M545 Low back pain, unspecified: Secondary | ICD-10-CM

## 2020-07-02 NOTE — Therapy (Signed)
[place] [place], [name], [address] Phone: [phone]   Fax:  [phone]  Physical Therapy Treatment  Patient Details  Name: Amanda Short MRN: [id number] Date of Birth: 1941-05-08 Referring Provider (PT): Rennard   Encounter Date: 07/02/2020   PT End of Session - 07/02/20 1053    Visit Number 36    Number of Visits 41    Date for PT Re-Evaluation 08/20/20    Authorization Type [employer]    PT Start Time 1005    PT Stop Time 1052    PT Time Calculation (min) 47 min    Activity Tolerance Patient tolerated treatment well    Behavior During Therapy [facility] for tasks assessed/performed           Past Medical History:  Diagnosis Date  . Abdominal pain   . Abnormal CT of the abdomen   . Acute blood loss anemia   . Acute cystitis with hematuria 12/19/2019  . Acute encephalopathy 06/06/2017  . Acute pulmonary embolism ([place]) 04/01/2013  . Acute respiratory failure with hypoxia ([place])   . Anxiety state   . Arthritis    "back, hands" (07/22/2015)  . Aspiration pneumonia ([place])   . Benign essential HTN   . Cancer of left breast ([place]) 2006   S/P lumpectomy  . Cellulitis of foot, right 07/21/2015  . Cellulitis of right foot 07/21/2015  . Central retinal vein occlusion of right eye 04/20/2016  . Chronic diastolic CHF (congestive heart failure) ([place]) 07/10/2017  . Colitis 05/27/2017  . Complication of anesthesia    "brief breathing problem at surgery center in ~ 2005 when I had gallbladder OR"  . Concussion 03/2013   due to fall  . Depression   . Diabetes ([place]) 04/01/2013  . 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 Diabetes mellitus type 2, controlled ([place]) 07/21/2015  . Diabetic foot infection ([place]) 07/21/2015  . Diverticulitis of colon   . Diverticulitis of large intestine without perforation or abscess without bleeding   . Diverticulosis   . DVT (deep venous thrombosis) ([place]) 03/2013   LLE  . Encephalopathy    . Encounter for attention to tracheostomy ([place])   . Encounter for central line placement   . Encounter for orogastric (OG) tube placement   . Essential hypertension, benign 04/01/2013  . GERD (gastroesophageal reflux disease)   . History of acute respiratory failure   . History of pulmonary embolism   . HX: breast cancer 04/01/2013  . Hyperlipidemia   . Hypertension   . Hypocalcemia 05/27/2017  . Hypokalemia 07/10/2017  . Hypomagnesemia 07/11/2017  . Hypothyroidism   . Hypoxia 03/23/2020  . Nausea vomiting and diarrhea 07/10/2017  . Non-intractable cyclical vomiting with nausea   . Nuclear sclerotic cataract of both eyes 04/20/2016  . OSA (obstructive sleep apnea) 07/10/2017  . OSA on CPAP 04/01/2013  . OSA treated with BiPAP   . Physical debility 06/23/2017  . 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 Pulmonary embolism ([place]) 03/2013  . Pulmonary HTN ([place]) 07/05/2017  . Seizure ([place]) 07/10/2017  . Seizure disorder ([place]) 07/05/2017  . Seizures ([place])   . Sepsis ([place]) 07/10/2017  . Sleep apnea   . Status epilepticus ([place])   . 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 Tachypnea   . Thyroid disease    Hypothyroid  . Tracheostomy, acute management ([place])   . Type II diabetes mellitus ([place])     Past Surgical History:  Procedure Laterality  Date  . ABDOMINAL HYSTERECTOMY  1970s  . BREAST BIOPSY Left 2006  . BREAST LUMPECTOMY Left 2006  . BUNIONECTOMY WITH HAMMERTOE RECONSTRUCTION Left   . ESOPHAGOGASTRODUODENOSCOPY N/A 05/29/2017   Procedure: ESOPHAGOGASTRODUODENOSCOPY (EGD);  Surgeon: Irene Shipper, MD;  Location: [facility];  Service: Endoscopy;  Laterality: N/A;  . JOINT REPLACEMENT    . LAPAROSCOPIC CHOLECYSTECTOMY  ~ 2005  . RIGHT/LEFT HEART CATH AND CORONARY ANGIOGRAPHY N/A 05/24/2020   Procedure: RIGHT/LEFT HEART CATH AND CORONARY ANGIOGRAPHY;  Surgeon: Jolaine Artist, MD;  Location: [facility];  Service: Cardiovascular;  Laterality: N/A;  . TOTAL HIP ARTHROPLASTY Left  2010  . TOTAL KNEE ARTHROPLASTY Bilateral 2005-2006    There were no vitals filed for this visit.   Subjective Assessment - 07/02/20 1015    Subjective Reports feeling better, less pain and less shortness of breath today    Currently in Pain? No/denies                             [facility] Adult PT Treatment/Exercise - 07/02/20 0001      Ambulation/Gait   Gait Comments up and down stairs step over step 4" and 6", 115' with light HHA, 125' with light HHA, very short of breath but her O2 saturation was above 94%      High Level Balance   High Level Balance Activities Backward walking;Side stepping    High Level Balance Comments on airex ball toss, on airex red tband two way scapular stabilization, airex balance beam      Lumbar Exercises: Aerobic   Nustep level 5 x 6 minutes, a lot of cues to speed up      Lumbar Exercises: Standing   Other Standing Lumbar Exercises on airex 2# biceps and 2# puches in her available ROM      Lumbar Exercises: Seated   Long Arc Quad on Chair Both;2 sets;10 reps    LAQ on Chair Weights (lbs) 5#    Other Seated Lumbar Exercises marching 5# 2x10 each                    PT Short Term Goals - 12/04/19 1627      PT SHORT TERM GOAL #1   Title independent with intial HEP    Status Achieved             PT Long Term Goals - 07/02/20 1055      PT LONG TERM GOAL #1   Title increase overall hip LE strength for functional gait and safety hips and ankles to 4+/5    Status Partially Met      PT LONG TERM GOAL #2   Title decrease TUG time to less than 14 seconds for functional gait and safety    Status Achieved                 Plan - 07/02/20 1053    Clinical Impression Statement Patient short of breath with activity but her O2 saturation was good.  She will be talking to one MD on Monday and then another on Tuesday, she is excited to hopefully find out if they have a plan, She did need a lot of cues for posture,  she tended to slouch a lot today    PT Next Visit Plan would like to continue after her testing to see if we can get her to a higher functional level and quality of  life    Consulted and Agree with Plan of Care Patient           Patient will benefit from skilled therapeutic intervention in order to improve the following deficits and impairments:  Abnormal gait,Decreased range of motion,Difficulty walking,Increased muscle spasms,Decreased activity tolerance,Pain,Decreased balance,Impaired flexibility,Improper body mechanics,Postural dysfunction,Decreased strength,Decreased mobility  Visit Diagnosis: Muscle weakness (generalized)  Risk for falls  Difficulty in walking, not elsewhere classified  Physical debility  Acute bilateral low back pain without sciatica  Other abnormalities of gait and mobility  Muscle spasm of back     Problem List Patient Active Problem List   Diagnosis Date Noted  . Type II diabetes mellitus ([place])   . Thyroid disease   . Sleep apnea   . Seizures ([place])   . OSA treated with BiPAP   . Hypertension   . Hyperlipidemia   . GERD (gastroesophageal reflux disease)   . Diverticulosis   . Depression   . Complication of anesthesia   . Arthritis   . Hypoxia 03/23/2020  . Acute cystitis with hematuria 12/19/2019  . Hypomagnesemia 07/11/2017  . Nausea vomiting and diarrhea 07/10/2017  . Hypokalemia 07/10/2017  . Chronic diastolic CHF (congestive heart failure) ([place]) 07/10/2017  . Seizure ([place]) 07/10/2017  . OSA (obstructive sleep apnea) 07/10/2017  . Sepsis ([place]) 07/10/2017  . Seizure disorder ([place]) 07/05/2017  . Pulmonary HTN ([place]) 07/05/2017  . Anxiety state   . Physical debility 06/23/2017  . 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 History of acute respiratory failure   . Tracheostomy, acute management ([place])   . Aspiration pneumonia ([place])   . Encephalopathy   . Status epilepticus ([place])   . Benign essential HTN   . Diabetes mellitus type 2 in  nonobese (HCC)   . History of pulmonary embolism   . Acute blood loss anemia   . Diverticulitis of colon   . Tachypnea   . Encounter for orogastric (OG) tube placement   . Encounter for central line placement   . Encounter for attention to tracheostomy ([place])   . Acute respiratory failure with hypoxia ([place])   . Acute encephalopathy 06/06/2017  . Diverticulitis of large intestine without perforation or abscess without bleeding   . Abdominal pain   . Non-intractable cyclical vomiting with nausea   . Hypocalcemia 05/27/2017  . 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 Colitis 05/27/2017  . Central retinal vein occlusion of right eye 04/20/2016  . Nuclear sclerotic cataract of both eyes 04/20/2016  . Diabetic foot infection ([place]) 07/21/2015  . Diabetes mellitus type 2, controlled ([place]) 07/21/2015  . Cellulitis of foot, right 07/21/2015  . Cellulitis of right foot 07/21/2015  . Acute pulmonary embolism ([place]) 04/01/2013  . DVT (deep venous thrombosis) ([place]) 04/01/2013  . HX: breast cancer 04/01/2013  . Concussion 04/01/2013  . Diabetes ([place]) 04/01/2013  . Essential hypertension, benign 04/01/2013  . Hypothyroidism 04/01/2013  . OSA on CPAP 04/01/2013  . Pulmonary embolism ([place]) 03/2013  . Cancer of left breast [facility]) 2006    [name] Boast., PT 07/02/2020, 10:56 AM  [place] [place], [name], [address] Phone: [phone]   Fax:  [phone]  Name: Drema Eddington MRN: [id number] Date of Birth: 01/23/1941

## 2020-07-05 ENCOUNTER — Other Ambulatory Visit: Payer: Self-pay

## 2020-07-05 ENCOUNTER — Encounter (HOSPITAL_COMMUNITY): Payer: Self-pay

## 2020-07-05 ENCOUNTER — Ambulatory Visit (HOSPITAL_COMMUNITY)
Admission: RE | Admit: 2020-07-05 | Discharge: 2020-07-05 | Disposition: A | Discharge status: Home / Self Care | Payer: Medicare PPO | Source: Ambulatory Visit | Attending: Internal Medicine | Admitting: Internal Medicine

## 2020-07-05 ENCOUNTER — Telehealth (HOSPITAL_COMMUNITY): Payer: Self-pay

## 2020-07-05 VITALS — Wt 154.0 lb

## 2020-07-05 DIAGNOSIS — I272 Pulmonary hypertension, unspecified: Secondary | ICD-10-CM

## 2020-07-05 DIAGNOSIS — R911 Solitary pulmonary nodule: Secondary | ICD-10-CM

## 2020-07-05 DIAGNOSIS — I5032 Chronic diastolic (congestive) heart failure: Secondary | ICD-10-CM

## 2020-07-05 DIAGNOSIS — G4733 Obstructive sleep apnea (adult) (pediatric): Secondary | ICD-10-CM

## 2020-07-05 NOTE — Addendum Note (Signed)
Encounter addended by: Malena Edman, RN on: 07/05/2020 3:30 PM  Actions taken: Visit diagnoses modified, Order list changed, Diagnosis association updated, Clinical Note Signed, Order Reconciliation Section accessed, Medication List reviewed, Home Medications modified

## 2020-07-05 NOTE — Telephone Encounter (Signed)
Amanda Short has provided verbal consent on 07/05/2020 for a virtual visit (video or telephone).   CONSENT FOR VIRTUAL VISIT FOR:  Amanda Short  By participating in this virtual visit I agree to the following:  I hereby voluntarily request, consent and authorize [facility] and its employed or contracted physicians, Engineer, materials, nurse practitioners or other licensed health care professionals (the Practitioner), to provide me with telemedicine health care services (the Services") as deemed necessary by the treating Practitioner. I acknowledge and consent to receive the Services by the Practitioner via telemedicine. I understand that the telemedicine visit will involve communicating with the Practitioner through live audiovisual communication technology and the disclosure of certain medical information by electronic transmission. I acknowledge that I have been given the opportunity to request an in-person assessment or other available alternative prior to the telemedicine visit and am voluntarily participating in the telemedicine visit.  I understand that I have the right to withhold or withdraw my consent to the use of telemedicine in the course of my care at any time, without affecting my right to future care or treatment, and that the Practitioner or I may terminate the telemedicine visit at any time. I understand that I have the right to inspect all information obtained and/or recorded in the course of the telemedicine visit and may receive copies of available information for a reasonable fee.  I understand that some of the potential risks of receiving the Services via telemedicine include:   Delay or interruption in medical evaluation due to technological equipment failure or disruption;  Information transmitted may not be sufficient (e.g. poor resolution of images) to allow for appropriate medical decision making by the Practitioner; and/or   In rare instances, security  protocols could fail, causing a breach of personal health information.  Furthermore, I acknowledge that it is my responsibility to provide information about my medical history, conditions and care that is complete and accurate to the best of my ability. I acknowledge that Practitioner's advice, recommendations, and/or decision may be based on factors not within their control, such as incomplete or inaccurate data provided by me or distortions of diagnostic images or specimens that may result from electronic transmissions. I understand that the practice of medicine is not an exact science and that Practitioner makes no warranties or guarantees regarding treatment outcomes. I acknowledge that a copy of this consent can be made available to me via my patient portal ([facility]), or I can request a printed copy by calling the office of [facility].    I understand that my insurance will be billed for this visit.   I have read or had this consent read to me.  I understand the contents of this consent, which adequately explains the benefits and risks of the Services being provided via telemedicine.   I have been provided ample opportunity to ask questions regarding this consent and the Services and have had my questions answered to my satisfaction.  I give my informed consent for the services to be provided through the use of telemedicine in my medical care

## 2020-07-05 NOTE — Progress Notes (Signed)
Heart Failure TeleHealth Note  Due to national recommendations of social distancing due to [place] 19, Audio/video telehealth visit is felt to be most appropriate for this patient at this time.  See MyChart message from today for patient consent regarding telehealth for [facility]. The patient was identified personally using two identifiers.   Date:  07/05/2020   ID:  Amanda Short, DOB 05-Mar-1941, MRN [id number]  Location: Home  Provider location: [facility] Type of Visit: Established patient  PCP:  Libby Maw, MD  Cardiologist:  No primary care provider on file. Primary HF: Amanda Short  Chief Complaint: Heart Failure follow-up   History of Present Illness:  Amanda Harkleroadis a 79 y.o.femalewith multiple medical issues including DM2, HTN. OSA previously on CPAP), previous smoker (quit 1995), previous DVT/PE (2014) and h/o chronic respiratory failure s/p previous tracheostomy (removed) referred by Dr. Drue Dun for further evaluation of pulmonary HTN seen on echo.   Echo 7/21: LVEF 65-705 with restrictive diastolic filling  RV moderately enlarged with moderate HK. Mod to severe TR. Severe septal flattening RVSP estimated at 108.  CT abdomen 2/21: showed calcified granuloma in RLL VQ scan 8/21: Normal  In 2018 had severe n/v/d and led to intractable seizures. Got extubated but then developed recurrent distress but could not be re-intubated due to laryngeal swelling. Had emergent trach which was removed after several weeks. Smoked 1ppd x 20+ years. Quit in 1995.   I saw her for the first time in 10/21 and recommended R/L cath.   R/L 05/24/20 Minimal CAD (20%). Severe PAH Ao = 150/81 (109) LV = 150/18 RA = 12 RV = 94/13 PA = 92/36 (56) PCW = 11 Fick cardiac output/index = 3.0/1.8 PVR = 15.1 WU FA sat = 96% PA sat = 61%, 60%  PFTs  06/21/20 FEV1  1.11L (65%) FVC   1.65L  (71%) FEF 25-75  0.61L DLCO 35%  She  presents via audio conferencing for a telehealth visit today (she refuses video visit as she doesn't have video capabilities). Still very fatigued and short of breath with any activity. No edema, orthopnea or PND. Continues to wear O2 at 3L. Sats in high 80s to low 90s most of the time. Sats can drop into the 70s.   Amanda Short denies symptoms worrisome for COVID 19.   Past Medical History:  Diagnosis Date  . Abdominal pain   . Abnormal CT of the abdomen   . Acute blood loss anemia   . Acute cystitis with hematuria 12/19/2019  . Acute encephalopathy 06/06/2017  . Acute pulmonary embolism ([place]) 04/01/2013  . Acute respiratory failure with hypoxia ([place])   . Anxiety state   . Arthritis    "back, hands" (07/22/2015)  . Aspiration pneumonia ([place])   . Benign essential HTN   . Cancer of left breast ([place]) 2006   S/P lumpectomy  . Cellulitis of foot, right 07/21/2015  . Cellulitis of right foot 07/21/2015  . Central retinal vein occlusion of right eye 04/20/2016  . Chronic diastolic CHF (congestive heart failure) ([place]) 07/10/2017  . Colitis 05/27/2017  . Complication of anesthesia    "brief breathing problem at surgery center in ~ 2005 when I had gallbladder OR"  . Concussion 03/2013   due to fall  . Depression   . Diabetes ([place]) 04/01/2013  . 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 Diabetes mellitus type 2, controlled ([place]) 07/21/2015  . Diabetic foot infection ([place]) 07/21/2015  .  Diverticulitis of colon   . Diverticulitis of large intestine without perforation or abscess without bleeding   . Diverticulosis   . DVT (deep venous thrombosis) ([place]) 03/2013   LLE  . Encephalopathy   . Encounter for attention to tracheostomy ([place])   . Encounter for central line placement   . Encounter for orogastric (OG) tube placement   . Essential hypertension, benign 04/01/2013  . GERD (gastroesophageal reflux disease)   . History of acute respiratory failure   . History of pulmonary embolism   . HX:  breast cancer 04/01/2013  . Hyperlipidemia   . Hypertension   . Hypocalcemia 05/27/2017  . Hypokalemia 07/10/2017  . Hypomagnesemia 07/11/2017  . Hypothyroidism   . Hypoxia 03/23/2020  . Nausea vomiting and diarrhea 07/10/2017  . Non-intractable cyclical vomiting with nausea   . Nuclear sclerotic cataract of both eyes 04/20/2016  . OSA (obstructive sleep apnea) 07/10/2017  . OSA on CPAP 04/01/2013  . OSA treated with BiPAP   . Physical debility 06/23/2017  . 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 Pulmonary embolism ([place]) 03/2013  . Pulmonary HTN ([place]) 07/05/2017  . Seizure ([place]) 07/10/2017  . Seizure disorder ([place]) 07/05/2017  . Seizures ([place])   . Sepsis ([place]) 07/10/2017  . Sleep apnea   . Status epilepticus ([place])   . 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 Tachypnea   . Thyroid disease    Hypothyroid  . Tracheostomy, acute management ([place])   . Type II diabetes mellitus ([place])    Past Surgical History:  Procedure Laterality Date  . ABDOMINAL HYSTERECTOMY  1970s  . BREAST BIOPSY Left 2006  . BREAST LUMPECTOMY Left 2006  . BUNIONECTOMY WITH HAMMERTOE RECONSTRUCTION Left   . ESOPHAGOGASTRODUODENOSCOPY N/A 05/29/2017   Procedure: ESOPHAGOGASTRODUODENOSCOPY (EGD);  Surgeon: Irene Shipper, MD;  Location: [facility];  Service: Endoscopy;  Laterality: N/A;  . JOINT REPLACEMENT    . LAPAROSCOPIC CHOLECYSTECTOMY  ~ 2005  . RIGHT/LEFT HEART CATH AND CORONARY ANGIOGRAPHY N/A 05/24/2020   Procedure: RIGHT/LEFT HEART CATH AND CORONARY ANGIOGRAPHY;  Surgeon: Jolaine Artist, MD;  Location: [facility];  Service: Cardiovascular;  Laterality: N/A;  . TOTAL HIP ARTHROPLASTY Left 2010  . TOTAL KNEE ARTHROPLASTY Bilateral 2005-2006     Current Outpatient Medications  Medication Sig Dispense Refill  . acetaminophen (TYLENOL) 500 MG tablet Take 500-1,000 mg by mouth every 6 (six) hours as needed for mild pain.     Marland Kitchen ALPRAZolam (XANAX) 0.5 MG tablet TAKE 1 TABLET BY MOUTH EVERYDAY AT  BEDTIME 30 tablet 4  . atorvastatin (LIPITOR) 20 MG tablet Take 20 mg by mouth daily.    . BD PEN NEEDLE NANO 2ND GEN 32G X 4 MM MISC daily.    . calcium-vitamin D (OSCAL WITH D) 500-200 MG-UNIT tablet Take 1 tablet by mouth 2 (two) times daily.    Marland Kitchen diltiazem (CARDIZEM) 30 MG tablet Take 30 mg by mouth 2 (two) times daily.     . DULoxetine (CYMBALTA) 60 MG capsule Take 60 mg by mouth daily.    . febuxostat (ULORIC) 40 MG tablet Take 40 mg daily by mouth.    . furosemide (LASIX) 40 MG tablet Take 40 mg by mouth daily.    . insulin degludec (TRESIBA FLEXTOUCH) 100 UNIT/ML SOPN FlexTouch Pen Inject 26 Units into the skin daily.     Marland Kitchen levETIRAcetam (KEPPRA) 500 MG tablet Take 500 mg by mouth 2 (two) times daily.    Marland Kitchen levothyroxine (SYNTHROID, LEVOTHROID) 100 MCG tablet  Take 100 mcg by mouth daily before breakfast.    . loratadine (CLARITIN) 10 MG tablet Take 10 mg by mouth daily as needed for allergies.    . metoprolol (TOPROL-XL) 200 MG 24 hr tablet Take 100 mg by mouth at bedtime.     . montelukast (SINGULAIR) 10 MG tablet Take 10 mg at bedtime by mouth.    . Multiple Vitamin (MULTIVITAMIN WITH MINERALS) TABS tablet Take 1 tablet daily by mouth.    . nateglinide (STARLIX) 120 MG tablet Take 120 mg by mouth daily.     Marland Kitchen omeprazole (PRILOSEC) 40 MG capsule Take 40 mg by mouth 2 (two) times daily.    . OXYGEN Inhale 3 L/min into the lungs.    Marland Kitchen OZEMPIC, 0.25 OR 0.5 MG/DOSE, 2 MG/1.5ML SOPN INJECT 0.5 MG WEEKLY 4.5 mL 4  . Rivaroxaban (XARELTO) 15 MG TABS tablet Take 15 mg by mouth daily.    Marland Kitchen spironolactone (ALDACTONE) 25 MG tablet Take 12.5 mg by mouth daily.     No current facility-administered medications for this encounter.    Allergies:   Allopurinol and Aspirin   Social History:  The patient  reports that she quit smoking about 26 years ago. Her smoking use included cigarettes. She has a 30.00 pack-year smoking history. She has never used smokeless tobacco. She reports that she does not  drink alcohol and does not use drugs.   Family History:  The patient's family history includes Breast cancer in her cousin and sister; Colon cancer in her maternal grandmother; Diabetes in her sister and another family member; Hypertension in her mother; Kidney disease in her sister; Liver cancer in her mother; Uterine cancer in her mother.   ROS:  Please see the history of present illness.   All other systems are personally reviewed and negative.   Exam:  (Video/Tele Health Call; Exam is subjective and or/visual.) General:  Speaks in full sentences. No resp difficulty. Lungs: Normal respiratory effort with conversation.  Abdomen: Non-distended per patient report Extremities: Pt denies edema. Neuro: Alert & oriented x 3.   Recent Labs: 02/16/2020: ALT 8; TSH 0.190 05/14/2020: BUN 29; Creatinine, Ser 1.36; Platelets 248 05/24/2020: Hemoglobin 13.9; Potassium 4.3; Sodium 146  Personally reviewed   Wt Readings from Last 3 Encounters:  07/05/20 69.9 kg (154 lb)  06/14/20 71.1 kg (156 lb 12.8 oz)  05/24/20 70.8 kg (156 lb)      ASSESSMENT AND PLAN:   1. Pulmonary HTN with cor pulmonale, severe  - Echo 7/21: LVEF 65-70% with restrictive diastolic filling  RV moderately enlarged with moderate HK. Mod to severe TR. Severe septal flattening RVSP estimated at 108.  - CT abdomen 2/21: showed calcified granuloma in RLL - VQ scan 8/21: Normal - RHC 11/21 with severe PAH PA = 92/36 (56) PCW = 11 CI 1.8 PVR 15 - Continue supplemental O2. Will increase O2 to 4L (see below) - PFTs 12/21 show mild to moderate obstruction but no hyperinflation.  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 Will plan. 1. Hi-res CT of  chest 2. Increase O2 to 4L for now 3. Sleep study 4. Refer to Dr. Silas Flood in Pulmonary  2. Chronic hypoxic respiratory failure - quit tobacco 1995 -  on home O2 at 3L - has h/o tracheostomy - PFTs 12/21 show mild to moderate obstruction but no hyperinflation.  - Plan as above   3. OSA - need to restart CPAP  - will repeat sleep study as not done for many years. This has been ordered but not performed due to insurance approval   COVID screen The patient does not have any symptoms that suggest any further testing/ screening at this time.  Social distancing reinforced today.  Recommended follow-up:  As above  Relevant cardiac medications were reviewed at length with the patient today.   The patient does not have concerns regarding their medications at this time.   The following changes were made today:  As above  Today, I have spent 16 minutes with the patient with telehealth technology discussing the above issues .    Signed, Glori Bickers, MD  07/05/2020 2:29 PM  Advanced Heart Failure [facility] [address] [facility] [place] [address] [phone] (office) [phone] (fax)

## 2020-07-05 NOTE — Patient Instructions (Addendum)
It was wonderful speaking with you today!  Your physician recommends that you INCREASE your oxygen to 4L  You have been referred to Dr. Kavin Leech office, they will contact you to schedule an appointment  Non-Cardiac CT scanning, (CAT scanning), is a noninvasive, special x-ray that produces cross-sectional images of the body using x-rays and a computer. CT scans help physicians diagnose and treat medical conditions. For some CT exams, a contrast material is used to enhance visibility in the area of the body being studied. CT scans provide greater clarity and reveal more details than regular x-ray exams. ONCE WE APPROVE WITH INSURANCE, WE WILL CONTACT YOU TO SCHEDULE   Your physician has recommended that you have a sleep study. This test records several body functions during sleep, including: brain activity, eye movement, oxygen and carbon dioxide blood levels, heart rate and rhythm, breathing rate and rhythm, the flow of air through your mouth and nose, snoring, body muscle movements, and chest and belly movement. WE HAVE PUT YOU ON A WAITING LIST FOR WHEN HOME SLEEP STUDIES ARRIVE AT CLINIC AND WE WILL APPROVE WITH INSURANCE, ONCE WE APPROVE WE WILL CONTACT YOU   Your physician recommends that you schedule a follow-up appointment in: 2 months on February 7th at 10:20am  If you have any questions or concerns before your next appointment please send [place] a message through [facility] or call our office at [phone].    TO LEAVE A MESSAGE FOR THE NURSE SELECT OPTION 2, PLEASE LEAVE A MESSAGE INCLUDING: . YOUR NAME . DATE OF BIRTH . CALL BACK NUMBER . REASON FOR CALL**this is important as we prioritize the call backs  YOU WILL RECEIVE A CALL BACK THE SAME DAY AS LONG AS YOU CALL BEFORE 4:00 PM

## 2020-07-06 ENCOUNTER — Encounter: Payer: Self-pay | Admitting: Physical Therapy

## 2020-07-06 ENCOUNTER — Ambulatory Visit: Payer: Medicare PPO | Admitting: Physical Therapy

## 2020-07-06 DIAGNOSIS — R5381 Other malaise: Secondary | ICD-10-CM

## 2020-07-06 DIAGNOSIS — M62830 Muscle spasm of back: Secondary | ICD-10-CM

## 2020-07-06 DIAGNOSIS — R262 Difficulty in walking, not elsewhere classified: Secondary | ICD-10-CM

## 2020-07-06 DIAGNOSIS — Z9181 History of falling: Secondary | ICD-10-CM

## 2020-07-06 DIAGNOSIS — R2689 Other abnormalities of gait and mobility: Secondary | ICD-10-CM

## 2020-07-06 DIAGNOSIS — M545 Low back pain, unspecified: Secondary | ICD-10-CM

## 2020-07-06 DIAGNOSIS — M6281 Muscle weakness (generalized): Secondary | ICD-10-CM | POA: Diagnosis not present

## 2020-07-06 NOTE — Therapy (Signed)
[facility]. [place], [name], [address] Phone: [phone]   Fax:  [phone]  Physical Therapy Treatment  Patient Details  Name: Amanda Short MRN: [id number] Date of Birth: June 04, 1941 Referring Provider (PT): Rennard   Encounter Date: 07/06/2020   PT End of Session - 07/06/20 5726    Visit Number 57    Date for PT Re-Evaluation 08/20/20    Authorization Type [employer]    Activity Tolerance Patient tolerated treatment well    Behavior During Therapy [facility] for tasks assessed/performed           Past Medical History:  Diagnosis Date  . Abdominal pain   . Abnormal CT of the abdomen   . Acute blood loss anemia   . Acute cystitis with hematuria 12/19/2019  . Acute encephalopathy 06/06/2017  . Acute pulmonary embolism ([place]) 04/01/2013  . Acute respiratory failure with hypoxia ([place])   . Anxiety state   . Arthritis    "back, hands" (07/22/2015)  . Aspiration pneumonia ([place])   . Benign essential HTN   . Cancer of left breast ([place]) 2006   S/P lumpectomy  . Cellulitis of foot, right 07/21/2015  . Cellulitis of right foot 07/21/2015  . Central retinal vein occlusion of right eye 04/20/2016  . Chronic diastolic CHF (congestive heart failure) ([place]) 07/10/2017  . Colitis 05/27/2017  . Complication of anesthesia    "brief breathing problem at surgery center in ~ 2005 when I had gallbladder OR"  . Concussion 03/2013   due to fall  . Depression   . Diabetes ([place]) 04/01/2013  . 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 Diabetes mellitus type 2, controlled ([place]) 07/21/2015  . Diabetic foot infection ([place]) 07/21/2015  . Diverticulitis of colon   . Diverticulitis of large intestine without perforation or abscess without bleeding   . Diverticulosis   . DVT (deep venous thrombosis) ([place]) 03/2013   LLE  . Encephalopathy   . Encounter for attention to tracheostomy ([place])   . Encounter for central line placement   . Encounter  for orogastric (OG) tube placement   . Essential hypertension, benign 04/01/2013  . GERD (gastroesophageal reflux disease)   . History of acute respiratory failure   . History of pulmonary embolism   . HX: breast cancer 04/01/2013  . Hyperlipidemia   . Hypertension   . Hypocalcemia 05/27/2017  . Hypokalemia 07/10/2017  . Hypomagnesemia 07/11/2017  . Hypothyroidism   . Hypoxia 03/23/2020  . Nausea vomiting and diarrhea 07/10/2017  . Non-intractable cyclical vomiting with nausea   . Nuclear sclerotic cataract of both eyes 04/20/2016  . OSA (obstructive sleep apnea) 07/10/2017  . OSA on CPAP 04/01/2013  . OSA treated with BiPAP   . Physical debility 06/23/2017  . 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 Pulmonary embolism ([place]) 03/2013  . Pulmonary HTN ([place]) 07/05/2017  . Seizure ([place]) 07/10/2017  . Seizure disorder ([place]) 07/05/2017  . Seizures ([place])   . Sepsis ([place]) 07/10/2017  . Sleep apnea   . Status epilepticus ([place])   . 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 Tachypnea   . Thyroid disease    Hypothyroid  . Tracheostomy, acute management ([place])   . Type II diabetes mellitus ([place])     Past Surgical History:  Procedure Laterality Date  . ABDOMINAL HYSTERECTOMY  1970s  . BREAST BIOPSY Left 2006  . BREAST LUMPECTOMY Left 2006  . BUNIONECTOMY WITH HAMMERTOE RECONSTRUCTION Left   . ESOPHAGOGASTRODUODENOSCOPY  N/A 05/29/2017   Procedure: ESOPHAGOGASTRODUODENOSCOPY (EGD);  Surgeon: Irene Shipper, MD;  Location: [facility];  Service: Endoscopy;  Laterality: N/A;  . JOINT REPLACEMENT    . LAPAROSCOPIC CHOLECYSTECTOMY  ~ 2005  . RIGHT/LEFT HEART CATH AND CORONARY ANGIOGRAPHY N/A 05/24/2020   Procedure: RIGHT/LEFT HEART CATH AND CORONARY ANGIOGRAPHY;  Surgeon: Jolaine Artist, MD;  Location: [facility];  Service: Cardiovascular;  Laterality: N/A;  . TOTAL HIP ARTHROPLASTY Left 2010  . TOTAL KNEE ARTHROPLASTY Bilateral 2005-2006    There were no vitals filed for this visit.    Subjective Assessment - 07/06/20 1530    Subjective Saw the DR. she has been changed to 4 liters O2, she will be having a sleep study and a CT scan, at that time they will look at changing/adding different medications, prior to starting O2 saturation was 97%    Currently in Pain? No/denies                             [facility] Adult PT Treatment/Exercise - 07/06/20 0001      Ambulation/Gait   Gait Comments up and down stairs , then really worked more on wallking with the O2, 115' x 4 and then 175 feet, the shorter walks she would fatigue, but O2 only dropped once into the 85% range.      Lumbar Exercises: Aerobic   Nustep level 5 x 6 minutes, a lot of cues to speed up      Lumbar Exercises: Standing   Other Standing Lumbar Exercises 3# marching, extension and hip abduction and HS curls with walker 2x10     Other Standing Lumbar Exercises on airex 2# biceps and 2# puches in her available ROM      Lumbar Exercises: Seated   Long Arc Quad on Chair Both;2 sets;10 reps    LAQ on Chair Weights (lbs) 5#                    PT Short Term Goals - 12/04/19 1627      PT SHORT TERM GOAL #1   Title independent with intial HEP    Status Achieved             PT Long Term Goals - 07/02/20 1055      PT LONG TERM GOAL #1   Title increase overall hip LE strength for functional gait and safety hips and ankles to 4+/5    Status Partially Met      PT LONG TERM GOAL #2   Title decrease TUG time to less than 14 seconds for functional gait and safety    Status Achieved                 Plan - 07/06/20 1652    Clinical Impression Statement She is now on 4L O2, with walking today which I focused much more on, she did well, O2saturation stayed above 90% all but one time when it dropped to the mid 80's.  She did have fatigue  and shortness of breath with the 11' walks but really asked to sit when we did the 175 foot walk.    PT Next Visit Plan O2 is now at 4L, she is  being scheduled for a sleep study and for a CT and they have some plans to change medications once the results are    Consulted and Agree with Plan of Care Patient  Patient will benefit from skilled therapeutic intervention in order to improve the following deficits and impairments:  Abnormal gait,Decreased range of motion,Difficulty walking,Increased muscle spasms,Decreased activity tolerance,Pain,Decreased balance,Impaired flexibility,Improper body mechanics,Postural dysfunction,Decreased strength,Decreased mobility  Visit Diagnosis: Muscle weakness (generalized)  Risk for falls  Difficulty in walking, not elsewhere classified  Physical debility  Acute bilateral low back pain without sciatica  Other abnormalities of gait and mobility  Muscle spasm of back     Problem List Patient Active Problem List   Diagnosis Date Noted  . Type II diabetes mellitus ([place])   . Thyroid disease   . Sleep apnea   . Seizures ([place])   . OSA treated with BiPAP   . Hypertension   . Hyperlipidemia   . GERD (gastroesophageal reflux disease)   . Diverticulosis   . Depression   . Complication of anesthesia   . Arthritis   . Hypoxia 03/23/2020  . Acute cystitis with hematuria 12/19/2019  . Hypomagnesemia 07/11/2017  . Nausea vomiting and diarrhea 07/10/2017  . Hypokalemia 07/10/2017  . Chronic diastolic CHF (congestive heart failure) ([place]) 07/10/2017  . Seizure ([place]) 07/10/2017  . OSA (obstructive sleep apnea) 07/10/2017  . Sepsis ([place]) 07/10/2017  . Seizure disorder ([place]) 07/05/2017  . Pulmonary HTN ([place]) 07/05/2017  . Anxiety state   . Physical debility 06/23/2017  . 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 History of acute respiratory failure   . Tracheostomy, acute management ([place])   . Aspiration pneumonia ([place])   . Encephalopathy   . Status epilepticus ([place])   . Benign essential HTN   . 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 History of pulmonary embolism   .  Acute blood loss anemia   . Diverticulitis of colon   . Tachypnea   . Encounter for orogastric (OG) tube placement   . Encounter for central line placement   . Encounter for attention to tracheostomy ([place])   . Acute respiratory failure with hypoxia ([place])   . Acute encephalopathy 06/06/2017  . Diverticulitis of large intestine without perforation or abscess without bleeding   . Abdominal pain   . Non-intractable cyclical vomiting with nausea   . Hypocalcemia 05/27/2017  . 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 Colitis 05/27/2017  . Central retinal vein occlusion of right eye 04/20/2016  . Nuclear sclerotic cataract of both eyes 04/20/2016  . Diabetic foot infection ([place]) 07/21/2015  . Diabetes mellitus type 2, controlled ([place]) 07/21/2015  . Cellulitis of foot, right 07/21/2015  . Cellulitis of right foot 07/21/2015  . Acute pulmonary embolism ([place]) 04/01/2013  . DVT (deep venous thrombosis) ([place]) 04/01/2013  . HX: breast cancer 04/01/2013  . Concussion 04/01/2013  . Diabetes ([place]) 04/01/2013  . Essential hypertension, benign 04/01/2013  . Hypothyroidism 04/01/2013  . OSA on CPAP 04/01/2013  . Pulmonary embolism ([place]) 03/2013  . Cancer of left breast [facility]) 2006    [name] Boast., PT 07/06/2020, 4:56 PM  [facility]. [place], [name], [address] Phone: [phone]   Fax:  [phone]  Name: Amanda Short MRN: [id number] Date of Birth: 25-Mar-1941

## 2020-07-08 ENCOUNTER — Other Ambulatory Visit: Payer: Self-pay

## 2020-07-08 ENCOUNTER — Ambulatory Visit: Payer: Medicare PPO | Admitting: Physical Therapy

## 2020-07-08 ENCOUNTER — Encounter: Payer: Self-pay | Admitting: Physical Therapy

## 2020-07-08 DIAGNOSIS — R5381 Other malaise: Secondary | ICD-10-CM

## 2020-07-08 DIAGNOSIS — M6281 Muscle weakness (generalized): Secondary | ICD-10-CM | POA: Diagnosis not present

## 2020-07-08 DIAGNOSIS — M62830 Muscle spasm of back: Secondary | ICD-10-CM

## 2020-07-08 DIAGNOSIS — M545 Low back pain, unspecified: Secondary | ICD-10-CM

## 2020-07-08 DIAGNOSIS — R2689 Other abnormalities of gait and mobility: Secondary | ICD-10-CM

## 2020-07-08 DIAGNOSIS — Z9181 History of falling: Secondary | ICD-10-CM

## 2020-07-08 DIAGNOSIS — R262 Difficulty in walking, not elsewhere classified: Secondary | ICD-10-CM

## 2020-07-08 NOTE — Therapy (Signed)
[place] [place], [name], [address] Phone: [phone]   Fax:  [phone]  Physical Therapy Treatment  Patient Details  Name: Amanda Short MRN: [id number] Date of Birth: 1941-05-29 Referring Provider (PT): Rennard   Encounter Date: 07/08/2020   PT End of Session - 07/08/20 1421    Visit Number 48    Number of Visits 32    Date for PT Re-Evaluation 08/20/20    Authorization Type [employer]    PT Start Time 1352    PT Stop Time 1440    PT Time Calculation (min) 48 min    Activity Tolerance Patient limited by fatigue    Behavior During Therapy [facility] for tasks assessed/performed           Past Medical History:  Diagnosis Date   Abdominal pain    Abnormal CT of the abdomen    Acute blood loss anemia    Acute cystitis with hematuria 12/19/2019   Acute encephalopathy 06/06/2017   Acute pulmonary embolism ([place]) 04/01/2013   Acute respiratory failure with hypoxia ([place])    Anxiety state    Arthritis    "back, hands" (07/22/2015)   Aspiration pneumonia ([place])    Benign essential HTN    Cancer of left breast ([place]) 2006   S/P lumpectomy   Cellulitis of foot, right 07/21/2015   Cellulitis of right foot 07/21/2015   Central retinal vein occlusion of right eye 04/20/2016   Chronic diastolic CHF (congestive heart failure) ([place]) 07/10/2017   Colitis 00/92/3300   Complication of anesthesia    "brief breathing problem at surgery center in ~ 2005 when I had gallbladder OR"   Concussion 03/2013   due to fall   Depression    Diabetes ([place]) 04/01/2013   Diabetes mellitus type 2 in nonobese ([place])    Diabetes mellitus type 2, controlled ([place]) 07/21/2015   Diabetic foot infection ([place]) 07/21/2015   Diverticulitis of colon    Diverticulitis of large intestine without perforation or abscess without bleeding    Diverticulosis    DVT (deep venous thrombosis) ([place]) 03/2013   LLE   Encephalopathy     Encounter for attention to tracheostomy [facility])    Encounter for central line placement    Encounter for orogastric (OG) tube placement    Essential hypertension, benign 04/01/2013   GERD (gastroesophageal reflux disease)    History of acute respiratory failure    History of pulmonary embolism    HX: breast cancer 04/01/2013   Hyperlipidemia    Hypertension    Hypocalcemia 05/27/2017   Hypokalemia 07/10/2017   Hypomagnesemia 07/11/2017   Hypothyroidism    Hypoxia 03/23/2020   Nausea vomiting and diarrhea 07/10/2017   Non-intractable cyclical vomiting with nausea    Nuclear sclerotic cataract of both eyes 04/20/2016   OSA (obstructive sleep apnea) 07/10/2017   OSA on CPAP 04/01/2013   OSA treated with BiPAP    Physical debility 06/23/2017   Prolonged QT interval 05/27/2017   Pulmonary embolism ([place]) 03/2013   Pulmonary HTN ([place]) 07/05/2017   Seizure ([place]) 07/10/2017   Seizure disorder ([place]) 07/05/2017   Seizures ([place])    Sepsis ([place]) 07/10/2017   Sleep apnea    Status epilepticus ([place])    Status post trachelectomy 06/23/2017   Tachypnea    Thyroid disease    Hypothyroid   Tracheostomy, acute management ([place])    Type II diabetes mellitus ([place])     Past Surgical History:  Procedure Laterality  Date   ABDOMINAL HYSTERECTOMY  1970s   BREAST BIOPSY Left 2006   BREAST LUMPECTOMY Left 2006   BUNIONECTOMY WITH HAMMERTOE RECONSTRUCTION Left    ESOPHAGOGASTRODUODENOSCOPY N/A 05/29/2017   Procedure: ESOPHAGOGASTRODUODENOSCOPY (EGD);  Surgeon: Irene Shipper, MD;  Location: [facility];  Service: Endoscopy;  Laterality: N/A;   JOINT REPLACEMENT     LAPAROSCOPIC CHOLECYSTECTOMY  ~ 2005   RIGHT/LEFT HEART CATH AND CORONARY ANGIOGRAPHY N/A 05/24/2020   Procedure: RIGHT/LEFT HEART CATH AND CORONARY ANGIOGRAPHY;  Surgeon: Jolaine Artist, MD;  Location: [facility];  Service: Cardiovascular;  Laterality: N/A;   TOTAL HIP ARTHROPLASTY Left  2010   TOTAL KNEE ARTHROPLASTY Bilateral 2005-2006    There were no vitals filed for this visit.   Subjective Assessment - 07/08/20 1353    Subjective Patient reports that she ha not been sleeping well, not using the cpap, reports that she feels very tired and fatigued, O2 was 99%    Currently in Pain? No/denies                             [facility] Adult PT Treatment/Exercise - 07/08/20 0001      Ambulation/Gait   Gait Comments gait HHA x 120 feet x 2      Lumbar Exercises: Aerobic   Nustep level 3 x 6 minutes      Lumbar Exercises: Seated   Long Arc Quad on Chair Both;2 sets;10 reps    LAQ on Chair Weights (lbs) 3#    Other Seated Lumbar Exercises marching 3# 2x10 each    Other Seated Lumbar Exercises red tband ankle exercises 2x10 all motions bilatrally                    PT Short Term Goals - 12/04/19 1627      PT SHORT TERM GOAL #1   Title independent with intial HEP    Status Achieved             PT Long Term Goals - 07/08/20 1444      PT LONG TERM GOAL #1   Title increase overall hip LE strength for functional gait and safety hips and ankles to 4+/5    Status Partially Met      PT LONG TERM GOAL #3   Title increase BERG to 50/56 for safety and independence    Status Partially Met      PT LONG TERM GOAL #4   Title decrease LBP 50% with activities    Status Partially Met                 Plan - 07/08/20 1422    Clinical Impression Statement Patient reports that she was very tired, she thinks she is not sleeping well, I backed off some and we were still able to walk 120' 3x but she was fatigued and needed recovery, c/o SOB and fatigue in the legs, O2 saturation remained 93% and above throughout the session    PT Next Visit Plan O2 is now at 4L, she is being scheduled for a sleep study and for a CT and they have some plans to change medications once the results are    Consulted and Agree with Plan of Care Patient            Patient will benefit from skilled therapeutic intervention in order to improve the following deficits and impairments:  Abnormal gait,Decreased range of motion,Difficulty  walking,Increased muscle spasms,Decreased activity tolerance,Pain,Decreased balance,Impaired flexibility,Improper body mechanics,Postural dysfunction,Decreased strength,Decreased mobility  Visit Diagnosis: Muscle weakness (generalized)  Risk for falls  Difficulty in walking, not elsewhere classified  Physical debility  Acute bilateral low back pain without sciatica  Other abnormalities of gait and mobility  Muscle spasm of back     Problem List Patient Active Problem List   Diagnosis Date Noted   Type II diabetes mellitus ([place])    Thyroid disease    Sleep apnea    Seizures (HCC)    OSA treated with BiPAP    Hypertension    Hyperlipidemia    GERD (gastroesophageal reflux disease)    Diverticulosis    Depression    Complication of anesthesia    Arthritis    Hypoxia 03/23/2020   Acute cystitis with hematuria 12/19/2019   Hypomagnesemia 07/11/2017   Nausea vomiting and diarrhea 07/10/2017   Hypokalemia 07/10/2017   Chronic diastolic CHF (congestive heart failure) ([place]) 07/10/2017   Seizure ([place]) 07/10/2017   OSA (obstructive sleep apnea) 07/10/2017   Sepsis ([place]) 07/10/2017   Seizure disorder ([place]) 07/05/2017   Pulmonary HTN ([place]) 07/05/2017   Anxiety state    Physical debility 06/23/2017   Status post trachelectomy 06/23/2017   History of acute respiratory failure    Tracheostomy, acute management ([place])    Aspiration pneumonia ([place])    Encephalopathy    Status epilepticus ([place])    Benign essential HTN    Diabetes mellitus type 2 in nonobese ([place])    History of pulmonary embolism    Acute blood loss anemia    Diverticulitis of colon    Tachypnea    Encounter for orogastric (OG) tube placement    Encounter for central line placement    Encounter  for attention to tracheostomy ([place])    Acute respiratory failure with hypoxia ([place])    Acute encephalopathy 06/06/2017   Diverticulitis of large intestine without perforation or abscess without bleeding    Abdominal pain    Non-intractable cyclical vomiting with nausea    Hypocalcemia 05/27/2017   Prolonged QT interval 05/27/2017   Colitis 05/27/2017   Central retinal vein occlusion of right eye 04/20/2016   Nuclear sclerotic cataract of both eyes 04/20/2016   Diabetic foot infection ([place]) 07/21/2015   Diabetes mellitus type 2, controlled ([place]) 07/21/2015   Cellulitis of foot, right 07/21/2015   Cellulitis of right foot 07/21/2015   Acute pulmonary embolism ([place]) 04/01/2013   DVT (deep venous thrombosis) ([place]) 04/01/2013   HX: breast cancer 04/01/2013   Concussion 04/01/2013   Diabetes ([place]) 04/01/2013   Essential hypertension, benign 04/01/2013   Hypothyroidism 04/01/2013   OSA on CPAP 04/01/2013   Pulmonary embolism ([place]) 03/2013   Cancer of left breast ([place]) 2006    [name] Boast., PT 07/08/2020, 2:46 PM  [place] [place], [name], [address] Phone: [phone]   Fax:  [phone]  Name: Kayah Hecker MRN: [id number] Date of Birth: 1941-06-12

## 2020-07-12 DIAGNOSIS — R935 Abnormal findings on diagnostic imaging of other abdominal regions, including retroperitoneum: Secondary | ICD-10-CM | POA: Insufficient documentation

## 2020-07-13 ENCOUNTER — Ambulatory Visit: Payer: Medicare PPO | Admitting: Cardiology

## 2020-07-13 ENCOUNTER — Other Ambulatory Visit: Payer: Self-pay

## 2020-07-13 ENCOUNTER — Ambulatory Visit: Payer: Medicare PPO | Admitting: Physical Therapy

## 2020-07-13 ENCOUNTER — Encounter: Payer: Self-pay | Admitting: Cardiology

## 2020-07-13 VITALS — BP 102/54 | HR 75 | Ht 61.0 in | Wt 159.0 lb

## 2020-07-13 DIAGNOSIS — G4733 Obstructive sleep apnea (adult) (pediatric): Secondary | ICD-10-CM | POA: Diagnosis not present

## 2020-07-13 DIAGNOSIS — I272 Pulmonary hypertension, unspecified: Secondary | ICD-10-CM

## 2020-07-13 DIAGNOSIS — I5032 Chronic diastolic (congestive) heart failure: Secondary | ICD-10-CM | POA: Diagnosis not present

## 2020-07-13 DIAGNOSIS — I10 Essential (primary) hypertension: Secondary | ICD-10-CM | POA: Diagnosis not present

## 2020-07-13 DIAGNOSIS — Z9989 Dependence on other enabling machines and devices: Secondary | ICD-10-CM

## 2020-07-13 NOTE — Progress Notes (Signed)
Cardiology Office Note:    Date:  07/13/2020   ID:  Amanda Short, DOB 10-10-1940, MRN [id number]  PCP:  Libby Maw, MD  Cardiologist:  Jenne Campus, MD    Referring MD: Libby Maw,*   Chief Complaint  Patient presents with  . Follow-up  Am still short of breath  History of Present Illness:    Amanda Short is a 79 y.o. female with past medical history significant for severe pulmonary hypertension with mean pulmonary artery pressure 56 mmHg, pulmonary artery wedge pressure 11, pulmonary vascular resistance 15, suspected predominantly group 3, COPD, obstructive sleep apnea, history of pulmonary emboli with negative VQ scan in August 2021, essential hypertension, dyslipidemia. Comes today 2 months for follow-up overall doing for shortness of breath getting slightly worse use oxygen all the time she is tired and exhausted she is disappointed with the slowness of work-up.  She has been scheduled to see pulmonary waiting for appointment, she is scheduled to have sleep study and waiting for it.  However overall coping with the situation quite well.  She did have pulmonary function test done and based on that decision will be made what medication to use for pulmonary hypertension.  Denies have any palpitations no chest pain tightness squeezing pressure burning chest no swelling of lower extremities no proximal nocturnal dyspnea.  Past Medical History:  Diagnosis Date  . Abdominal pain   . Abnormal CT of the abdomen   . Acute blood loss anemia   . Acute cystitis with hematuria 12/19/2019  . Acute encephalopathy 06/06/2017  . Acute pulmonary embolism ([place]) 04/01/2013  . Acute respiratory failure with hypoxia ([place])   . Anxiety state   . Arthritis    "back, hands" (07/22/2015)  . Aspiration pneumonia ([place])   . Benign essential HTN   . Cancer of left breast ([place]) 2006   S/P lumpectomy  . Cellulitis of foot, right 07/21/2015  . Cellulitis of right foot 07/21/2015   . Central retinal vein occlusion of right eye 04/20/2016  . Chronic diastolic CHF (congestive heart failure) ([place]) 07/10/2017  . Colitis 05/27/2017  . Complication of anesthesia    "brief breathing problem at surgery center in ~ 2005 when I had gallbladder OR"  . Concussion 03/2013   due to fall  . Depression   . Diabetes ([place]) 04/01/2013  . 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 Diabetes mellitus type 2, controlled ([place]) 07/21/2015  . Diabetic foot infection ([place]) 07/21/2015  . Diverticulitis of colon   . Diverticulitis of large intestine without perforation or abscess without bleeding   . Diverticulosis   . DVT (deep venous thrombosis) ([place]) 03/2013   LLE  . Encephalopathy   . Encounter for attention to tracheostomy ([place])   . Encounter for central line placement   . Encounter for orogastric (OG) tube placement   . Essential hypertension, benign 04/01/2013  . GERD (gastroesophageal reflux disease)   . History of acute respiratory failure   . History of pulmonary embolism   . HX: breast cancer 04/01/2013  . Hyperlipidemia   . Hypertension   . Hypocalcemia 05/27/2017  . Hypokalemia 07/10/2017  . Hypomagnesemia 07/11/2017  . Hypothyroidism   . Hypoxia 03/23/2020  . Nausea vomiting and diarrhea 07/10/2017  . Non-intractable cyclical vomiting with nausea   . Nuclear sclerotic cataract of both eyes 04/20/2016  . OSA (obstructive sleep apnea) 07/10/2017  . OSA on CPAP 04/01/2013  . OSA treated with BiPAP   . Physical debility  06/23/2017  . 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 Pulmonary embolism ([place]) 03/2013  . Pulmonary HTN ([place]) 07/05/2017  . Seizure ([place]) 07/10/2017  . Seizure disorder ([place]) 07/05/2017  . Seizures ([place])   . Sepsis ([place]) 07/10/2017  . Sleep apnea   . Status epilepticus ([place])   . 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 Tachypnea   . Thyroid disease    Hypothyroid  . Tracheostomy, acute management ([place])   . Type II diabetes mellitus ([place])     Past Surgical  History:  Procedure Laterality Date  . ABDOMINAL HYSTERECTOMY  1970s  . BREAST BIOPSY Left 2006  . BREAST LUMPECTOMY Left 2006  . BUNIONECTOMY WITH HAMMERTOE RECONSTRUCTION Left   . CARDIAC CATHETERIZATION  06/2020  . ESOPHAGOGASTRODUODENOSCOPY N/A 05/29/2017   Procedure: ESOPHAGOGASTRODUODENOSCOPY (EGD);  Surgeon: Irene Shipper, MD;  Location: [facility];  Service: Endoscopy;  Laterality: N/A;  . JOINT REPLACEMENT    . LAPAROSCOPIC CHOLECYSTECTOMY  ~ 2005  . Pulmonary function test  2-3 weeks ago  . RIGHT/LEFT HEART CATH AND CORONARY ANGIOGRAPHY N/A 05/24/2020   Procedure: RIGHT/LEFT HEART CATH AND CORONARY ANGIOGRAPHY;  Surgeon: Jolaine Artist, MD;  Location: [facility];  Service: Cardiovascular;  Laterality: N/A;  . TOTAL HIP ARTHROPLASTY Left 2010  . TOTAL KNEE ARTHROPLASTY Bilateral 2005-2006    Current Medications: Current Meds  Medication Sig  . acetaminophen (TYLENOL) 500 MG tablet Take 500-1,000 mg by mouth every 6 (six) hours as needed for mild pain.   Marland Kitchen ALPRAZolam (XANAX) 0.5 MG tablet TAKE 1 TABLET BY MOUTH EVERYDAY AT BEDTIME  . atorvastatin (LIPITOR) 20 MG tablet Take 20 mg by mouth daily.  . calcium-vitamin D (OSCAL WITH D) 500-200 MG-UNIT tablet Take 1 tablet by mouth 2 (two) times daily.  Marland Kitchen diltiazem (CARDIZEM) 30 MG tablet Take 30 mg by mouth 2 (two) times daily.   . DULoxetine (CYMBALTA) 60 MG capsule Take 60 mg by mouth daily.  . febuxostat (ULORIC) 40 MG tablet Take 40 mg daily by mouth.  . furosemide (LASIX) 40 MG tablet Take 40 mg by mouth daily.  . insulin degludec (TRESIBA FLEXTOUCH) 100 UNIT/ML SOPN FlexTouch Pen Inject 26 Units into the skin daily.   Marland Kitchen levETIRAcetam (KEPPRA) 500 MG tablet Take 500 mg by mouth 2 (two) times daily.  Marland Kitchen levothyroxine (SYNTHROID, LEVOTHROID) 100 MCG tablet Take 100 mcg by mouth daily before breakfast.  . loratadine (CLARITIN) 10 MG tablet Take 10 mg by mouth daily as needed for allergies.  . metoprolol (TOPROL-XL)  200 MG 24 hr tablet Take 100 mg by mouth at bedtime.   . montelukast (SINGULAIR) 10 MG tablet Take 10 mg at bedtime by mouth.  . Multiple Vitamin (MULTIVITAMIN WITH MINERALS) TABS tablet Take 1 tablet daily by mouth.  . nateglinide (STARLIX) 120 MG tablet Take 120 mg by mouth daily.   Marland Kitchen omeprazole (PRILOSEC) 40 MG capsule Take 40 mg by mouth 2 (two) times daily.  . OXYGEN Inhale 4 L/min into the lungs.  Marland Kitchen OZEMPIC, 0.25 OR 0.5 MG/DOSE, 2 MG/1.5ML SOPN INJECT 0.5 MG WEEKLY  . Rivaroxaban (XARELTO) 15 MG TABS tablet Take 15 mg by mouth daily.  Marland Kitchen spironolactone (ALDACTONE) 25 MG tablet Take 12.5 mg by mouth daily.     Allergies:   Allopurinol and Aspirin   Social History   Socioeconomic History  . Marital status: Married    Spouse name: Not on file  . Number of children: 1  . Years of education: Not on  file  . Highest education level: Not on file  Occupational History  . Occupation: retired  Tobacco Use  . Smoking status: Former Smoker    Packs/day: 1.00    Years: 30.00    Pack years: 30.00    Types: Cigarettes    Quit date: 1995    Years since quitting: 27.0  . Smokeless tobacco: Never Used  . Tobacco comment: "quit smoking cigarettes in the 1990s"  Vaping Use  . Vaping Use: Never used  Substance and Sexual Activity  . Alcohol use: No  . Drug use: No  . Sexual activity: Not Currently  Other Topics Concern  . Not on file  Social History Narrative  . Not on file   Social Determinants of Health   Financial Resource Strain: Low Risk   . Difficulty of Paying Living Expenses: Not hard at all  Food Insecurity: No Food Insecurity  . Worried About Charity fundraiser in the Last Year: Never true  . Ran Out of Food in the Last Year: Never true  Transportation Needs: No Transportation Needs  . Lack of Transportation (Medical): No  . Lack of Transportation (Non-Medical): No  Physical Activity: Insufficiently Active  . Days of Exercise per Week: 4 days  . Minutes of Exercise  per Session: 30 min  Stress: No Stress Concern Present  . Feeling of Stress : Not at all  Social Connections: Moderately Isolated  . Frequency of Communication with Friends and Family: More than three times a week  . Frequency of Social Gatherings with Friends and Family: Never  . Attends Religious Services: Never  . Active Member of Clubs or Organizations: No  . Attends Archivist Meetings: Never  . Marital Status: Married     Family History: The patient's family history includes Breast cancer in her cousin and sister; Colon cancer in her maternal grandmother; Diabetes in her sister and another family member; Hypertension in her mother; Kidney disease in her sister; Liver cancer in her mother; Uterine cancer in her mother. ROS:   Please see the history of present illness.    All 14 point review of systems negative except as described per history of present illness  EKGs/Labs/Other Studies Reviewed:      Recent Labs: 02/16/2020: ALT 8; TSH 0.190 05/14/2020: BUN 29; Creatinine, Ser 1.36; Platelets 248 05/24/2020: Hemoglobin 13.9; Potassium 4.3; Sodium 146  Recent Lipid Panel    Component Value Date/Time   CHOL 131 07/11/2017 0347   TRIG 144 07/11/2017 0347   HDL 46 07/11/2017 0347   CHOLHDL 2.8 07/11/2017 0347   VLDL 29 07/11/2017 0347   LDLCALC 56 07/11/2017 0347    Physical Exam:    VS:  BP (!) 102/54 (BP Location: Right Arm, Patient Position: Sitting)   Pulse 75   Ht _0  (1.549 m)   Wt 159 lb (72.1 kg)   SpO2 94%   BMI 30.04 kg/m     Wt Readings from Last 3 Encounters:  07/13/20 159 lb (72.1 kg)  07/05/20 154 lb (69.9 kg)  06/14/20 156 lb 12.8 oz (71.1 kg)     GEN:  Well nourished, well developed in no acute distress HEENT: Normal NECK: No JVD; No carotid bruits LYMPHATICS: No lymphadenopathy CARDIAC: RRR, no murmurs, no rubs, no gallops RESPIRATORY:  Clear to auscultation without rales, wheezing or rhonchi  ABDOMEN: Soft, non-tender,  non-distended MUSCULOSKELETAL:  No edema; No deformity  SKIN: Warm and dry LOWER EXTREMITIES: no swelling NEUROLOGIC:  Alert and oriented  x 3 PSYCHIATRIC:  Normal affect   ASSESSMENT:    1. Pulmonary HTN ([place])   2. Chronic diastolic CHF (congestive heart failure) ([place])   3. Benign essential HTN   4. OSA on CPAP    PLAN:    In order of problems listed above:  1. Pulmonary hypertension which is severe follow-up our pulmonary/pulmonary hypertension clinic.  Awaiting continuation of evaluation.  The therapy will be initiated as guided by pulmonary hypertension clinic. 2. Chronic diastolic congestive heart appears to be compensated on physical exam today.  I will check Chem-7 today.  proBNP will be checked as well. 3. Essential hypertension actually blood pressure is on the lower side we will continue present management. 4. Obstructive sleep apnea awaiting sleep study.  Apparently scheduled to be done sometimes in February. 5. Dyslipidemia: I will check her fasting lipid profile.   Medication Adjustments/Labs and Tests Ordered: Current medicines are reviewed at length with the patient today.  Concerns regarding medicines are outlined above.  No orders of the defined types were placed in this encounter.  Medication changes: No orders of the defined types were placed in this encounter.   Signed, Park Liter, MD, [facility] 07/13/2020 3:35 PM    [facility]

## 2020-07-13 NOTE — Patient Instructions (Signed)
Medication Instructions:  Your physician recommends that you continue on your current medications as directed. Please refer to the Current Medication list given to you today.  *If you need a refill on your cardiac medications before your next appointment, please call your pharmacy*   Lab Work: Your physician recommends that you return for lab work in: TODAY BMP, Lipids If you have labs (blood work) drawn today and your tests are completely normal, you will receive your results only by: Marland Kitchen MyChart Message (if you have MyChart) OR . A paper copy in the mail If you have any lab test that is abnormal or we need to change your treatment, we will call you to review the results.   Testing/Procedures: None   Follow-Up: At [facility], you and your health needs are our priority.  As part of our continuing mission to provide you with exceptional heart care, we have created designated Provider Care Teams.  These Care Teams include your primary Cardiologist (physician) and Advanced Practice Providers (APPs -  Physician Assistants and Nurse Practitioners) who all work together to provide you with the care you need, when you need it.  We recommend signing up for the patient portal called "MyChart".  Sign up information is provided on this After Visit Summary.  MyChart is used to connect with patients for Virtual Visits (Telemedicine).  Patients are able to view lab/test results, encounter notes, upcoming appointments, etc.  Non-urgent messages can be sent to your provider as well.   To learn more about what you can do with MyChart, go to [email or web address].    Your next appointment:   3 month(s)  The format for your next appointment:   In Person  Provider:   Jenne Campus, MD   Other Instructions

## 2020-07-14 ENCOUNTER — Telehealth: Payer: Self-pay

## 2020-07-14 LAB — BASIC METABOLIC PANEL
BUN/Creatinine Ratio: 25 (ref 12–28)
BUN: 32 mg/dL — ABNORMAL HIGH (ref 8–27)
CO2: 30 mmol/L — ABNORMAL HIGH (ref 20–29)
Calcium: 9.7 mg/dL (ref 8.7–10.3)
Chloride: 100 mmol/L (ref 96–106)
Creatinine, Ser: 1.26 mg/dL — ABNORMAL HIGH (ref 0.57–1.00)
GFR calc Af Amer: 47 mL/min/{1.73_m2} — ABNORMAL LOW (ref >59)
GFR calc non Af Amer: 41 mL/min/{1.73_m2} — ABNORMAL LOW (ref >59)
Glucose: 145 mg/dL — ABNORMAL HIGH (ref 65–99)
Potassium: 4.9 mmol/L (ref 3.5–5.2)
Sodium: 143 mmol/L (ref 134–144)

## 2020-07-14 LAB — LIPID PANEL
Chol/HDL Ratio: 2.7 ratio (ref 0.0–4.4)
Cholesterol, Total: 131 mg/dL (ref 100–199)
HDL: 49 mg/dL (ref >39)
LDL Chol Calc (NIH): 55 mg/dL (ref 0–99)
Triglycerides: 164 mg/dL — ABNORMAL HIGH (ref 0–149)
VLDL Cholesterol Cal: 27 mg/dL (ref 5–40)

## 2020-07-14 NOTE — Telephone Encounter (Signed)
-----  Message from Park Liter, MD sent at 07/14/2020  8:24 AM EST ----- Labs are looking good including cholesterol.  All things are looking stable.  Continue present management

## 2020-07-14 NOTE — Telephone Encounter (Signed)
Patient notified of results and verbalized understanding.

## 2020-07-15 ENCOUNTER — Other Ambulatory Visit: Payer: Self-pay

## 2020-07-15 ENCOUNTER — Encounter: Payer: Self-pay | Admitting: Physical Therapy

## 2020-07-15 ENCOUNTER — Ambulatory Visit: Payer: Medicare PPO | Admitting: Physical Therapy

## 2020-07-15 DIAGNOSIS — Z9181 History of falling: Secondary | ICD-10-CM

## 2020-07-15 DIAGNOSIS — R5381 Other malaise: Secondary | ICD-10-CM

## 2020-07-15 DIAGNOSIS — M6281 Muscle weakness (generalized): Secondary | ICD-10-CM | POA: Diagnosis not present

## 2020-07-15 DIAGNOSIS — R262 Difficulty in walking, not elsewhere classified: Secondary | ICD-10-CM

## 2020-07-15 DIAGNOSIS — M545 Low back pain, unspecified: Secondary | ICD-10-CM

## 2020-07-15 NOTE — Therapy (Signed)
[place] [place], [name], [address] Phone: [phone]   Fax:  [phone]  Physical Therapy Treatment  Patient Details  Name: Amanda Short MRN: [id number] Date of Birth: Aug 22, 1940 Referring Provider (PT): Rennard   Encounter Date: 07/15/2020   PT End of Session - 07/15/20 1524    Visit Number 30    Number of Visits 19    Date for PT Re-Evaluation 08/20/20    Authorization Type [employer]    PT Start Time 2111    PT Stop Time 7356    PT Time Calculation (min) 40 min    Activity Tolerance Patient limited by fatigue    Behavior During Therapy [facility] for tasks assessed/performed           Past Medical History:  Diagnosis Date   Abdominal pain    Abnormal CT of the abdomen    Acute blood loss anemia    Acute cystitis with hematuria 12/19/2019   Acute encephalopathy 06/06/2017   Acute pulmonary embolism ([place]) 04/01/2013   Acute respiratory failure with hypoxia ([place])    Anxiety state    Arthritis    "back, hands" (07/22/2015)   Aspiration pneumonia ([place])    Benign essential HTN    Cancer of left breast ([place]) 2006   S/P lumpectomy   Cellulitis of foot, right 07/21/2015   Cellulitis of right foot 07/21/2015   Central retinal vein occlusion of right eye 04/20/2016   Chronic diastolic CHF (congestive heart failure) ([place]) 07/10/2017   Colitis 70/14/1030   Complication of anesthesia    "brief breathing problem at surgery center in ~ 2005 when I had gallbladder OR"   Concussion 03/2013   due to fall   Depression    Diabetes ([place]) 04/01/2013   Diabetes mellitus type 2 in nonobese ([place])    Diabetes mellitus type 2, controlled ([place]) 07/21/2015   Diabetic foot infection ([place]) 07/21/2015   Diverticulitis of colon    Diverticulitis of large intestine without perforation or abscess without bleeding    Diverticulosis    DVT (deep venous thrombosis) ([place]) 03/2013   LLE   Encephalopathy     Encounter for attention to tracheostomy [facility])    Encounter for central line placement    Encounter for orogastric (OG) tube placement    Essential hypertension, benign 04/01/2013   GERD (gastroesophageal reflux disease)    History of acute respiratory failure    History of pulmonary embolism    HX: breast cancer 04/01/2013   Hyperlipidemia    Hypertension    Hypocalcemia 05/27/2017   Hypokalemia 07/10/2017   Hypomagnesemia 07/11/2017   Hypothyroidism    Hypoxia 03/23/2020   Nausea vomiting and diarrhea 07/10/2017   Non-intractable cyclical vomiting with nausea    Nuclear sclerotic cataract of both eyes 04/20/2016   OSA (obstructive sleep apnea) 07/10/2017   OSA on CPAP 04/01/2013   OSA treated with BiPAP    Physical debility 06/23/2017   Prolonged QT interval 05/27/2017   Pulmonary embolism ([place]) 03/2013   Pulmonary HTN ([place]) 07/05/2017   Seizure ([place]) 07/10/2017   Seizure disorder ([place]) 07/05/2017   Seizures ([place])    Sepsis ([place]) 07/10/2017   Sleep apnea    Status epilepticus ([place])    Status post trachelectomy 06/23/2017   Tachypnea    Thyroid disease    Hypothyroid   Tracheostomy, acute management ([place])    Type II diabetes mellitus ([place])     Past Surgical History:  Procedure Laterality  Date   ABDOMINAL HYSTERECTOMY  1970s   BREAST BIOPSY Left 2006   BREAST LUMPECTOMY Left 2006   BUNIONECTOMY WITH HAMMERTOE RECONSTRUCTION Left    CARDIAC CATHETERIZATION  06/2020   ESOPHAGOGASTRODUODENOSCOPY N/A 05/29/2017   Procedure: ESOPHAGOGASTRODUODENOSCOPY (EGD);  Surgeon: Irene Shipper, MD;  [facility];  Service: Endoscopy;  Laterality: N/A;   JOINT REPLACEMENT     LAPAROSCOPIC CHOLECYSTECTOMY  ~ 2005   Pulmonary function test  2-3 weeks ago   RIGHT/LEFT HEART CATH AND CORONARY ANGIOGRAPHY N/A 05/24/2020   Procedure: RIGHT/LEFT HEART CATH AND CORONARY ANGIOGRAPHY;  Surgeon: Jolaine Artist, MD;  Location: [facility];   Service: Cardiovascular;  Laterality: N/A;   TOTAL HIP ARTHROPLASTY Left 2010   TOTAL KNEE ARTHROPLASTY Bilateral 2005-2006    There were no vitals filed for this visit.   Subjective Assessment - 07/15/20 1401    Subjective Patient saw the cardiologist yesterday.  Reports no new news.  She will see a pulmonologist 07/24/19.    Currently in Pain? No/denies                             [facility] Adult PT Treatment/Exercise - 07/15/20 0001      Ambulation/Gait   Gait Comments gait HHA x 110 feet x 3, she would get to this limit and would have pain and fatigue in the legs and the chest      Lumbar Exercises: Aerobic   Nustep level 4 x 6 minutes      Lumbar Exercises: Standing   Other Standing Lumbar Exercises 3# marching, extension and hip abduction and HS curls with walker 2x10     Other Standing Lumbar Exercises on airex red tband 2 ways      Lumbar Exercises: Seated   Long Arc Quad on Chair Both;2 sets;10 reps    Other Seated Lumbar Exercises marching 3# 2x10 each    Other Seated Lumbar Exercises red tband ankle exercises 2x10 all motions bilatrally                    PT Short Term Goals - 12/04/19 1627      PT SHORT TERM GOAL #1   Title independent with intial HEP    Status Achieved             PT Long Term Goals - 07/15/20 1537      PT LONG TERM GOAL #1   Title increase overall hip LE strength for functional gait and safety hips and ankles to 4+/5    Status Partially Met      PT LONG TERM GOAL #2   Title decrease TUG time to less than 14 seconds for functional gait and safety    Status Achieved      PT LONG TERM GOAL #3   Title increase BERG to 50/56 for safety and independence    Status Partially Met                 Plan - 07/15/20 1530    Clinical Impression Statement O2 saturation lowest measured today was 86%, she struggled with the walking today, would get really tired and c/o leg weakness and shaking at 110'.  She  tolerated everything else well, she is still very unsteady and requires a light HHA for balance.  She reports that she will see a pulmonologist next Friday    PT Next Visit Plan O2 is now at  4L, she is being scheduled for a sleep study and for a CT and they have some plans to change medications once the results are    Consulted and Agree with Plan of Care Patient           Patient will benefit from skilled therapeutic intervention in order to improve the following deficits and impairments:  Abnormal gait,Decreased range of motion,Difficulty walking,Increased muscle spasms,Decreased activity tolerance,Pain,Decreased balance,Impaired flexibility,Improper body mechanics,Postural dysfunction,Decreased strength,Decreased mobility  Visit Diagnosis: Muscle weakness (generalized)  Risk for falls  Difficulty in walking, not elsewhere classified  Physical debility  Acute bilateral low back pain without sciatica     Problem List Patient Active Problem List   Diagnosis Date Noted   Abnormal CT of the abdomen    Type II diabetes mellitus ([place])    Thyroid disease    Sleep apnea    Seizures ([place])    OSA treated with BiPAP    Hypertension    Hyperlipidemia    GERD (gastroesophageal reflux disease)    Diverticulosis    Depression    Complication of anesthesia    Arthritis    Hypoxia 03/23/2020   Acute cystitis with hematuria 12/19/2019   Hypomagnesemia 07/11/2017   Nausea vomiting and diarrhea 07/10/2017   Hypokalemia 07/10/2017   Chronic diastolic CHF (congestive heart failure) ([place]) 07/10/2017   Seizure ([place]) 07/10/2017   OSA (obstructive sleep apnea) 07/10/2017   Sepsis ([place]) 07/10/2017   Seizure disorder ([place]) 07/05/2017   Pulmonary HTN ([place]) 07/05/2017   Anxiety state    Physical debility 06/23/2017   Status post trachelectomy 06/23/2017   History of acute respiratory failure    Tracheostomy, acute management ([place])    Aspiration pneumonia  ([place])    Encephalopathy    Status epilepticus ([place])    Benign essential HTN    Diabetes mellitus type 2 in nonobese ([place])    History of pulmonary embolism    Acute blood loss anemia    Diverticulitis of colon    Tachypnea    Encounter for orogastric (OG) tube placement    Encounter for central line placement    Encounter for attention to tracheostomy ([place])    Acute respiratory failure with hypoxia ([place])    Acute encephalopathy 06/06/2017   Diverticulitis of large intestine without perforation or abscess without bleeding    Abdominal pain    Non-intractable cyclical vomiting with nausea    Hypocalcemia 05/27/2017   Prolonged QT interval 05/27/2017   Colitis 05/27/2017   Central retinal vein occlusion of right eye 04/20/2016   Nuclear sclerotic cataract of both eyes 04/20/2016   Diabetic foot infection ([place]) 07/21/2015   Diabetes mellitus type 2, controlled ([place]) 07/21/2015   Cellulitis of foot, right 07/21/2015   Cellulitis of right foot 07/21/2015   Acute pulmonary embolism ([place]) 04/01/2013   DVT (deep venous thrombosis) ([place]) 04/01/2013   HX: breast cancer 04/01/2013   Concussion 04/01/2013   Diabetes ([place]) 04/01/2013   Essential hypertension, benign 04/01/2013   Hypothyroidism 04/01/2013   OSA on CPAP 04/01/2013   Pulmonary embolism ([place]) 03/2013   Cancer of left breast ([place]) 2006    [name] Boast., PT 07/15/2020, 3:38 PM  [place]. [place], [name], [address] Phone: [phone]   Fax:  [phone]  Name: Amanda Short MRN: [id number] Date of Birth: 02-Nov-1940

## 2020-07-20 ENCOUNTER — Ambulatory Visit: Payer: Medicare PPO | Attending: Family Medicine | Admitting: Physical Therapy

## 2020-07-20 ENCOUNTER — Encounter: Payer: Self-pay | Admitting: Physical Therapy

## 2020-07-20 ENCOUNTER — Other Ambulatory Visit: Payer: Self-pay

## 2020-07-20 DIAGNOSIS — Z9181 History of falling: Secondary | ICD-10-CM | POA: Insufficient documentation

## 2020-07-20 DIAGNOSIS — M62830 Muscle spasm of back: Secondary | ICD-10-CM

## 2020-07-20 DIAGNOSIS — M6281 Muscle weakness (generalized): Secondary | ICD-10-CM | POA: Diagnosis not present

## 2020-07-20 DIAGNOSIS — M545 Low back pain, unspecified: Secondary | ICD-10-CM | POA: Insufficient documentation

## 2020-07-20 DIAGNOSIS — R262 Difficulty in walking, not elsewhere classified: Secondary | ICD-10-CM | POA: Insufficient documentation

## 2020-07-20 DIAGNOSIS — R2689 Other abnormalities of gait and mobility: Secondary | ICD-10-CM | POA: Diagnosis present

## 2020-07-20 DIAGNOSIS — R5381 Other malaise: Secondary | ICD-10-CM | POA: Insufficient documentation

## 2020-07-20 NOTE — Therapy (Signed)
[name]. [place], [name], [address] Phone: [phone]   Fax:  [phone] Progress Note Reporting Period 06/02/20 to 07/20/20  See note below for Objective Data and Assessment of Progress/Goals.      Physical Therapy Treatment  Patient Details  Name: Amanda Short MRN: [id number] Date of Birth: [birth year] Referring Provider (PT): Rennard   Encounter Date: 07/20/2020   PT End of Session - 07/20/20 1441    Visit Number 50    Number of Visits 46    Date for PT Re-Evaluation 08/20/20    Authorization Type Hum[employer]    PT Start Time 7591    PT Stop Time 1442    PT Time Calculation (min) 45 min    Activity Tolerance Patient limited by fatigue    Behavior During Therapy [facility] for tasks assessed/performed           Past Medical History:  Diagnosis Date  . Abdominal pain   . Abnormal CT of the abdomen   . Acute blood loss anemia   . Acute cystitis with hematuria 12/19/2019  . Acute encephalopathy 06/06/2017  . Acute pulmonary embolism ([place]) 04/01/2013  . Acute respiratory failure with hypoxia ([place])   . Anxiety state   . Arthritis    "back, hands" (07/22/2015)  . Aspiration pneumonia ([place])   . Benign essential HTN   . Cancer of left breast ([place]) 2006   S/P lumpectomy  . Cellulitis of foot, right 07/21/2015  . Cellulitis of right foot 07/21/2015  . Central retinal vein occlusion of right eye 04/20/2016  . Chronic diastolic CHF (congestive heart failure) ([place]) 07/10/2017  . Colitis 05/27/2017  . Complication of anesthesia    "brief breathing problem at surgery center in ~ 2005 when I had gallbladder OR"  . Concussion 03/2013   due to fall  . Depression   . Diabetes ([place]) 04/01/2013  . 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 Diabetes mellitus type 2, controlled ([place]) 07/21/2015  . Diabetic foot infection ([place]) 07/21/2015  . Diverticulitis of colon   . Diverticulitis of large intestine without perforation  or abscess without bleeding   . Diverticulosis   . DVT (deep venous thrombosis) ([place]) 03/2013   LLE  . Encephalopathy   . Encounter for attention to tracheostomy ([place])   . Encounter for central line placement   . Encounter for orogastric (OG) tube placement   . Essential hypertension, benign 04/01/2013  . GERD (gastroesophageal reflux disease)   . History of acute respiratory failure   . History of pulmonary embolism   . HX: breast cancer 04/01/2013  . Hyperlipidemia   . Hypertension   . Hypocalcemia 05/27/2017  . Hypokalemia 07/10/2017  . Hypomagnesemia 07/11/2017  . Hypothyroidism   . Hypoxia 03/23/2020  . Nausea vomiting and diarrhea 07/10/2017  . Non-intractable cyclical vomiting with nausea   . Nuclear sclerotic cataract of both eyes 04/20/2016  . OSA (obstructive sleep apnea) 07/10/2017  . OSA on CPAP 04/01/2013  . OSA treated with BiPAP   . Physical debility 06/23/2017  . 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 Pulmonary embolism ([place]) 03/2013  . Pulmonary HTN ([place]) 07/05/2017  . Seizure ([place]) 07/10/2017  . Seizure disorder ([place]) 07/05/2017  . Seizures ([place])   . Sepsis ([place]) 07/10/2017  . Sleep apnea   . Status epilepticus ([place])   . 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 Tachypnea   . Thyroid disease    Hypothyroid  .  Tracheostomy, acute management ([place])   . Type II diabetes mellitus ([place])     Past Surgical History:  Procedure Laterality Date  . ABDOMINAL HYSTERECTOMY  1970s  . BREAST BIOPSY Left 2006  . BREAST LUMPECTOMY Left 2006  . BUNIONECTOMY WITH HAMMERTOE RECONSTRUCTION Left   . CARDIAC CATHETERIZATION  06/2020  . ESOPHAGOGASTRODUODENOSCOPY N/A 05/29/2017   Procedure: ESOPHAGOGASTRODUODENOSCOPY (EGD);  Surgeon: Irene Shipper, MD;  Location: [facility];  Service: Endoscopy;  Laterality: N/A;  . JOINT REPLACEMENT    . LAPAROSCOPIC CHOLECYSTECTOMY  ~ 2005  . Pulmonary function test  2-3 weeks ago  . RIGHT/LEFT HEART CATH AND CORONARY ANGIOGRAPHY N/A 05/24/2020    Procedure: RIGHT/LEFT HEART CATH AND CORONARY ANGIOGRAPHY;  Surgeon: Jolaine Artist, MD;  Location: [facility];  Service: Cardiovascular;  Laterality: N/A;  . TOTAL HIP ARTHROPLASTY Left 2010  . TOTAL KNEE ARTHROPLASTY Bilateral 2005-2006    There were no vitals filed for this visit.   Subjective Assessment - 07/20/20 1403    Subjective I just seem to struggle some, up and down with energy and breathing, I checked O2 after 80 fet of ambulation and she as at 79% on 4L O2    Currently in Pain? No/denies                             [facility] Adult PT Treatment/Exercise - 07/20/20 0001      Ambulation/Gait   Gait Comments gait with one HHA x80 feet, then HHA 3x110 feet O2 between 79% and 92%      High Level Balance   High Level Balance Comments on airex ball toss, on airex red tband two way scapular stabilization, airex balance beam, on airex 4" toe touches      Lumbar Exercises: Machines for Strengthening   Cybex Knee Extension 10# 3x10    Cybex Knee Flexion 25# 2x15    Leg Press 20# 3x10                    PT Short Term Goals - 12/04/19 1627      PT SHORT TERM GOAL #1   Title independent with intial HEP    Status Achieved             PT Long Term Goals - 07/15/20 1537      PT LONG TERM GOAL #1   Title increase overall hip LE strength for functional gait and safety hips and ankles to 4+/5    Status Partially Met      PT LONG TERM GOAL #2   Title decrease TUG time to less than 14 seconds for functional gait and safety    Status Achieved      PT LONG TERM GOAL #3   Title increase BERG to 50/56 for safety and independence    Status Partially Met                 Plan - 07/20/20 1443    Clinical Impression Statement Patient really struggled more today with her breathing, I could detect wheezing shen she she would fatigue.  He O2 seemed to be from 79-92%, when it was lower she would struggle more, she was able to do  strength and balance activities without much issue, just fatigue in the legs, the walking seems to be what gets her and really limits her function and quality of life    PT Next Visit Plan O2  is now at 4L, she is being scheduled for a sleep study and for a CT and they have some plans to change medications once the results are    Consulted and Agree with Plan of Care Patient           Patient will benefit from skilled therapeutic intervention in order to improve the following deficits and impairments:  Abnormal gait,Decreased range of motion,Difficulty walking,Increased muscle spasms,Decreased activity tolerance,Pain,Decreased balance,Impaired flexibility,Improper body mechanics,Postural dysfunction,Decreased strength,Decreased mobility  Visit Diagnosis: Muscle weakness (generalized)  Risk for falls  Difficulty in walking, not elsewhere classified  Physical debility  Acute bilateral low back pain without sciatica  Other abnormalities of gait and mobility  Muscle spasm of back     Problem List Patient Active Problem List   Diagnosis Date Noted  . Abnormal CT of the abdomen   . Type II diabetes mellitus ([place])   . Thyroid disease   . Sleep apnea   . Seizures ([place])   . OSA treated with BiPAP   . Hypertension   . Hyperlipidemia   . GERD (gastroesophageal reflux disease)   . Diverticulosis   . Depression   . Complication of anesthesia   . Arthritis   . Hypoxia 03/23/2020  . Acute cystitis with hematuria 12/19/2019  . Hypomagnesemia 07/11/2017  . Nausea vomiting and diarrhea 07/10/2017  . Hypokalemia 07/10/2017  . Chronic diastolic CHF (congestive heart failure) ([place]) 07/10/2017  . Seizure ([place]) 07/10/2017  . OSA (obstructive sleep apnea) 07/10/2017  . Sepsis ([place]) 07/10/2017  . Seizure disorder ([place]) 07/05/2017  . Pulmonary HTN ([place]) 07/05/2017  . Anxiety state   . Physical debility 06/23/2017  . 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 History of acute respiratory  failure   . Tracheostomy, acute management ([place])   . Aspiration pneumonia ([place])   . Encephalopathy   . Status epilepticus ([place])   . Benign essential HTN   . 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 History of pulmonary embolism   . Acute blood loss anemia   . Diverticulitis of colon   . Tachypnea   . Encounter for orogastric (OG) tube placement   . Encounter for central line placement   . Encounter for attention to tracheostomy ([place])   . Acute respiratory failure with hypoxia ([place])   . Acute encephalopathy 06/06/2017  . Diverticulitis of large intestine without perforation or abscess without bleeding   . Abdominal pain   . Non-intractable cyclical vomiting with nausea   . Hypocalcemia 05/27/2017  . 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 Colitis 05/27/2017  . Central retinal vein occlusion of right eye 04/20/2016  . Nuclear sclerotic cataract of both eyes 04/20/2016  . Diabetic foot infection ([place]) 07/21/2015  . Diabetes mellitus type 2, controlled ([place]) 07/21/2015  . Cellulitis of foot, right 07/21/2015  . Cellulitis of right foot 07/21/2015  . Acute pulmonary embolism ([place]) 04/01/2013  . DVT (deep venous thrombosis) ([place]) 04/01/2013  . HX: breast cancer 04/01/2013  . Concussion 04/01/2013  . Diabetes ([place]) 04/01/2013  . Essential hypertension, benign 04/01/2013  . Hypothyroidism 04/01/2013  . OSA on CPAP 04/01/2013  . Pulmonary embolism ([place]) 03/2013  . Cancer of left breast [facility]) 2006    [name] Boast., PT 07/20/2020, 2:46 PM  [place]. [place], [name], [address] Phone: [phone]   Fax:  [phone]  Name: Kortlyn Koltz MRN: [id number] Date of Birth: Jul 25, 1940

## 2020-07-22 ENCOUNTER — Other Ambulatory Visit: Payer: Self-pay

## 2020-07-22 ENCOUNTER — Ambulatory Visit: Payer: Medicare PPO | Admitting: Physical Therapy

## 2020-07-22 ENCOUNTER — Encounter: Payer: Self-pay | Admitting: Physical Therapy

## 2020-07-22 DIAGNOSIS — M62830 Muscle spasm of back: Secondary | ICD-10-CM

## 2020-07-22 DIAGNOSIS — Z9181 History of falling: Secondary | ICD-10-CM

## 2020-07-22 DIAGNOSIS — R5381 Other malaise: Secondary | ICD-10-CM

## 2020-07-22 DIAGNOSIS — R262 Difficulty in walking, not elsewhere classified: Secondary | ICD-10-CM

## 2020-07-22 DIAGNOSIS — M6281 Muscle weakness (generalized): Secondary | ICD-10-CM | POA: Diagnosis not present

## 2020-07-22 DIAGNOSIS — R2689 Other abnormalities of gait and mobility: Secondary | ICD-10-CM

## 2020-07-22 DIAGNOSIS — M545 Low back pain, unspecified: Secondary | ICD-10-CM

## 2020-07-22 NOTE — Therapy (Signed)
[place]. [place], [name], [address] Phone: [phone]   Fax:  [phone]  Physical Therapy Treatment  Patient Details  Name: Amanda Short MRN: [id number] Date of Birth: 09/22/1940 Referring Provider (PT): Rennard   Encounter Date: 07/22/2020   PT End of Session - 07/22/20 1446    Visit Number 28    Number of Visits 88    Date for PT Re-Evaluation 08/20/20    Authorization Type [employer]    PT Start Time 5053    PT Stop Time 1438    PT Time Calculation (min) 44 min    Activity Tolerance Patient limited by fatigue    Behavior During Therapy [facility] for tasks assessed/performed           Past Medical History:  Diagnosis Date  . Abdominal pain   . Abnormal CT of the abdomen   . Acute blood loss anemia   . Acute cystitis with hematuria 12/19/2019  . Acute encephalopathy 06/06/2017  . Acute pulmonary embolism ([place]) 04/01/2013  . Acute respiratory failure with hypoxia ([place])   . Anxiety state   . Arthritis    "back, hands" (07/22/2015)  . Aspiration pneumonia ([place])   . Benign essential HTN   . Cancer of left breast ([place]) 2006   S/P lumpectomy  . Cellulitis of foot, right 07/21/2015  . Cellulitis of right foot 07/21/2015  . Central retinal vein occlusion of right eye 04/20/2016  . Chronic diastolic CHF (congestive heart failure) ([place]) 07/10/2017  . Colitis 05/27/2017  . Complication of anesthesia    "brief breathing problem at surgery center in ~ 2005 when I had gallbladder OR"  . Concussion 03/2013   due to fall  . Depression   . Diabetes ([place]) 04/01/2013  . 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 Diabetes mellitus type 2, controlled ([place]) 07/21/2015  . Diabetic foot infection ([place]) 07/21/2015  . Diverticulitis of colon   . Diverticulitis of large intestine without perforation or abscess without bleeding   . Diverticulosis   . DVT (deep venous thrombosis) ([place]) 03/2013   LLE  . Encephalopathy   .  Encounter for attention to tracheostomy ([place])   . Encounter for central line placement   . Encounter for orogastric (OG) tube placement   . Essential hypertension, benign 04/01/2013  . GERD (gastroesophageal reflux disease)   . History of acute respiratory failure   . History of pulmonary embolism   . HX: breast cancer 04/01/2013  . Hyperlipidemia   . Hypertension   . Hypocalcemia 05/27/2017  . Hypokalemia 07/10/2017  . Hypomagnesemia 07/11/2017  . Hypothyroidism   . Hypoxia 03/23/2020  . Nausea vomiting and diarrhea 07/10/2017  . Non-intractable cyclical vomiting with nausea   . Nuclear sclerotic cataract of both eyes 04/20/2016  . OSA (obstructive sleep apnea) 07/10/2017  . OSA on CPAP 04/01/2013  . OSA treated with BiPAP   . Physical debility 06/23/2017  . 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 Pulmonary embolism ([place]) 03/2013  . Pulmonary HTN ([place]) 07/05/2017  . Seizure ([place]) 07/10/2017  . Seizure disorder ([place]) 07/05/2017  . Seizures ([place])   . Sepsis ([place]) 07/10/2017  . Sleep apnea   . Status epilepticus ([place])   . 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 Tachypnea   . Thyroid disease    Hypothyroid  . Tracheostomy, acute management ([place])   . Type II diabetes mellitus ([place])     Past Surgical History:  Procedure Laterality  Date  . ABDOMINAL HYSTERECTOMY  1970s  . BREAST BIOPSY Left 2006  . BREAST LUMPECTOMY Left 2006  . BUNIONECTOMY WITH HAMMERTOE RECONSTRUCTION Left   . CARDIAC CATHETERIZATION  06/2020  . ESOPHAGOGASTRODUODENOSCOPY N/A 05/29/2017   Procedure: ESOPHAGOGASTRODUODENOSCOPY (EGD);  Surgeon: Irene Shipper, MD;  Location: [facility];  Service: Endoscopy;  Laterality: N/A;  . JOINT REPLACEMENT    . LAPAROSCOPIC CHOLECYSTECTOMY  ~ 2005  . Pulmonary function test  2-3 weeks ago  . RIGHT/LEFT HEART CATH AND CORONARY ANGIOGRAPHY N/A 05/24/2020   Procedure: RIGHT/LEFT HEART CATH AND CORONARY ANGIOGRAPHY;  Surgeon: Jolaine Artist, MD;  Location: [facility];   Service: Cardiovascular;  Laterality: N/A;  . TOTAL HIP ARTHROPLASTY Left 2010  . TOTAL KNEE ARTHROPLASTY Bilateral 2005-2006    There were no vitals filed for this visit.   Subjective Assessment - 07/22/20 1401    Subjective Patient reports that she had a bad day yesterday.She reports that O2 was fine yesterday just did not have energy    Currently in Pain? No/denies                             [facility] Adult PT Treatment/Exercise - 07/22/20 0001      Ambulation/Gait   Gait Comments gait with HHA up and down stairs and then 115' 2x this caused significant fatigue in the chest and in the legs.  Having to rest at about 90 feet each time for about 30 seconds      Lumbar Exercises: Aerobic   Nustep level 5 had her try to go to 500 steps, needed cues to speed up      Lumbar Exercises: Standing   Other Standing Lumbar Exercises 3# marching, extension and hip abduction and HS curls with walker 2x10       Lumbar Exercises: Seated   Long Arc Quad on Chair Both;2 sets;10 reps    LAQ on Chair Weights (lbs) 3#    Other Seated Lumbar Exercises marching 3# 2x10 each, 2# bicep curls                    PT Short Term Goals - 12/04/19 1627      PT SHORT TERM GOAL #1   Title independent with intial HEP    Status Achieved             PT Long Term Goals - 07/22/20 1454      PT LONG TERM GOAL #1   Title increase overall hip LE strength for functional gait and safety hips and ankles to 4+/5    Status Partially Met      PT LONG TERM GOAL #2   Title decrease TUG time to less than 14 seconds for functional gait and safety    Status Achieved      PT LONG TERM GOAL #3   Title increase BERG to 50/56 for safety and independence    Status Partially Met                 Plan - 07/22/20 1447    Clinical Impression Statement I have been trying to decide what to do with her treatmetn, function vs strength, I worry about pushing her too much and is this okay to  try to keep her functional with a higher quality of life or are we causing more issues by taking her to her limits, O@ saturation was b/n 95% and 88%  today.  I am feeling like I will work on both but limit her extreme fatigue as she seems to get this with about 110 feet of walking    PT Next Visit Plan O2 is now at 4L, she is being scheduled for a sleep study and for a CT and they have some plans to change medications once the results are    Consulted and Agree with Plan of Care Patient           Patient will benefit from skilled therapeutic intervention in order to improve the following deficits and impairments:  Abnormal gait,Decreased range of motion,Difficulty walking,Increased muscle spasms,Decreased activity tolerance,Pain,Decreased balance,Impaired flexibility,Improper body mechanics,Postural dysfunction,Decreased strength,Decreased mobility  Visit Diagnosis: Muscle weakness (generalized)  Risk for falls  Difficulty in walking, not elsewhere classified  Physical debility  Acute bilateral low back pain without sciatica  Other abnormalities of gait and mobility  Muscle spasm of back     Problem List Patient Active Problem List   Diagnosis Date Noted  . Abnormal CT of the abdomen   . Type II diabetes mellitus ([place])   . Thyroid disease   . Sleep apnea   . Seizures ([place])   . OSA treated with BiPAP   . Hypertension   . Hyperlipidemia   . GERD (gastroesophageal reflux disease)   . Diverticulosis   . Depression   . Complication of anesthesia   . Arthritis   . Hypoxia 03/23/2020  . Acute cystitis with hematuria 12/19/2019  . Hypomagnesemia 07/11/2017  . Nausea vomiting and diarrhea 07/10/2017  . Hypokalemia 07/10/2017  . Chronic diastolic CHF (congestive heart failure) ([place]) 07/10/2017  . Seizure ([place]) 07/10/2017  . OSA (obstructive sleep apnea) 07/10/2017  . Sepsis ([place]) 07/10/2017  . Seizure disorder ([place]) 07/05/2017  . Pulmonary HTN ([place]) 07/05/2017  . Anxiety  state   . Physical debility 06/23/2017  . 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 History of acute respiratory failure   . Tracheostomy, acute management ([place])   . Aspiration pneumonia ([place])   . Encephalopathy   . Status epilepticus ([place])   . Benign essential HTN   . 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 History of pulmonary embolism   . Acute blood loss anemia   . Diverticulitis of colon   . Tachypnea   . Encounter for orogastric (OG) tube placement   . Encounter for central line placement   . Encounter for attention to tracheostomy ([place])   . Acute respiratory failure with hypoxia ([place])   . Acute encephalopathy 06/06/2017  . Diverticulitis of large intestine without perforation or abscess without bleeding   . Abdominal pain   . Non-intractable cyclical vomiting with nausea   . Hypocalcemia 05/27/2017  . 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 Colitis 05/27/2017  . Central retinal vein occlusion of right eye 04/20/2016  . Nuclear sclerotic cataract of both eyes 04/20/2016  . Diabetic foot infection ([place]) 07/21/2015  . Diabetes mellitus type 2, controlled ([place]) 07/21/2015  . Cellulitis of foot, right 07/21/2015  . Cellulitis of right foot 07/21/2015  . Acute pulmonary embolism ([place]) 04/01/2013  . DVT (deep venous thrombosis) ([place]) 04/01/2013  . HX: breast cancer 04/01/2013  . Concussion 04/01/2013  . Diabetes ([place]) 04/01/2013  . Essential hypertension, benign 04/01/2013  . Hypothyroidism 04/01/2013  . OSA on CPAP 04/01/2013  . Pulmonary embolism ([place]) 03/2013  . Cancer of left breast [facility]) 2006    [name] Boast., PT 07/22/2020, 2:54 PM  [facility]  [address] [place], [name], [address] Phone: [phone]   Fax:  [phone]  Name: Amanda Short MRN: [id number] Date of Birth: [birth year]

## 2020-07-26 ENCOUNTER — Ambulatory Visit: Payer: Medicare PPO | Admitting: Physical Therapy

## 2020-07-26 ENCOUNTER — Encounter: Payer: Self-pay | Admitting: Physical Therapy

## 2020-07-26 ENCOUNTER — Encounter: Payer: Self-pay | Admitting: Emergency Medicine

## 2020-07-26 ENCOUNTER — Other Ambulatory Visit: Payer: Self-pay

## 2020-07-26 ENCOUNTER — Ambulatory Visit (INDEPENDENT_AMBULATORY_CARE_PROVIDER_SITE_OTHER): Payer: Medicare PPO | Admitting: Emergency Medicine

## 2020-07-26 VITALS — BP 110/70 | HR 69 | Temp 97.3°F | Ht 61.0 in | Wt 157.2 lb

## 2020-07-26 DIAGNOSIS — R911 Solitary pulmonary nodule: Secondary | ICD-10-CM

## 2020-07-26 DIAGNOSIS — M6281 Muscle weakness (generalized): Secondary | ICD-10-CM | POA: Diagnosis not present

## 2020-07-26 DIAGNOSIS — Z23 Encounter for immunization: Secondary | ICD-10-CM

## 2020-07-26 DIAGNOSIS — J441 Chronic obstructive pulmonary disease with (acute) exacerbation: Secondary | ICD-10-CM | POA: Insufficient documentation

## 2020-07-26 DIAGNOSIS — R262 Difficulty in walking, not elsewhere classified: Secondary | ICD-10-CM

## 2020-07-26 DIAGNOSIS — J449 Chronic obstructive pulmonary disease, unspecified: Secondary | ICD-10-CM | POA: Diagnosis not present

## 2020-07-26 DIAGNOSIS — I272 Pulmonary hypertension, unspecified: Secondary | ICD-10-CM | POA: Diagnosis not present

## 2020-07-26 DIAGNOSIS — M62830 Muscle spasm of back: Secondary | ICD-10-CM

## 2020-07-26 DIAGNOSIS — Z9181 History of falling: Secondary | ICD-10-CM

## 2020-07-26 DIAGNOSIS — R5381 Other malaise: Secondary | ICD-10-CM

## 2020-07-26 DIAGNOSIS — J9611 Chronic respiratory failure with hypoxia: Secondary | ICD-10-CM | POA: Diagnosis not present

## 2020-07-26 DIAGNOSIS — R2689 Other abnormalities of gait and mobility: Secondary | ICD-10-CM

## 2020-07-26 DIAGNOSIS — M545 Low back pain, unspecified: Secondary | ICD-10-CM

## 2020-07-26 HISTORY — DX: Solitary pulmonary nodule: R91.1

## 2020-07-26 MED ORDER — STIOLTO RESPIMAT 2.5-2.5 MCG/ACT IN AERS: 2.0000 | INHALATION_SPRAY | Freq: Every day | RESPIRATORY_TRACT | 0 refills | Status: DC

## 2020-07-26 NOTE — Therapy (Signed)
[place]. [place], [name], [address] Phone: [phone]   Fax:  [phone]  Physical Therapy Treatment  Patient Details  Name: Amanda Short MRN: [id number] Date of Birth: 1941/06/07 Referring Provider (PT): Rennard   Encounter Date: 07/26/2020   PT End of Session - 07/26/20 1447    Visit Number 25    Number of Visits 55    Date for PT Re-Evaluation 08/20/20    Authorization Type [employer]    PT Start Time 2549    PT Stop Time 1440    PT Time Calculation (min) 43 min    Activity Tolerance Patient tolerated treatment well    Behavior During Therapy [facility] for tasks assessed/performed           Past Medical History:  Diagnosis Date  . Abdominal pain   . Abnormal CT of the abdomen   . Acute blood loss anemia   . Acute cystitis with hematuria 12/19/2019  . Acute encephalopathy 06/06/2017  . Acute pulmonary embolism ([place]) 04/01/2013  . Acute respiratory failure with hypoxia ([place])   . Anxiety state   . Arthritis    "back, hands" (07/22/2015)  . Aspiration pneumonia ([place])   . Benign essential HTN   . Cancer of left breast ([place]) 2006   S/P lumpectomy  . Cellulitis of foot, right 07/21/2015  . Cellulitis of right foot 07/21/2015  . Central retinal vein occlusion of right eye 04/20/2016  . Chronic diastolic CHF (congestive heart failure) ([place]) 07/10/2017  . Colitis 05/27/2017  . Complication of anesthesia    "brief breathing problem at surgery center in ~ 2005 when I had gallbladder OR"  . Concussion 03/2013   due to fall  . Depression   . Diabetes ([place]) 04/01/2013  . 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 Diabetes mellitus type 2, controlled ([place]) 07/21/2015  . Diabetic foot infection ([place]) 07/21/2015  . Diverticulitis of colon   . Diverticulitis of large intestine without perforation or abscess without bleeding   . Diverticulosis   . DVT (deep venous thrombosis) ([place]) 03/2013   LLE  . Encephalopathy    . Encounter for attention to tracheostomy ([place])   . Encounter for central line placement   . Encounter for orogastric (OG) tube placement   . Essential hypertension, benign 04/01/2013  . GERD (gastroesophageal reflux disease)   . History of acute respiratory failure   . History of pulmonary embolism   . HX: breast cancer 04/01/2013  . Hyperlipidemia   . Hypertension   . Hypocalcemia 05/27/2017  . Hypokalemia 07/10/2017  . Hypomagnesemia 07/11/2017  . Hypothyroidism   . Hypoxia 03/23/2020  . Nausea vomiting and diarrhea 07/10/2017  . Non-intractable cyclical vomiting with nausea   . Nuclear sclerotic cataract of both eyes 04/20/2016  . OSA (obstructive sleep apnea) 07/10/2017  . OSA on CPAP 04/01/2013  . OSA treated with BiPAP   . Physical debility 06/23/2017  . 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 Pulmonary embolism ([place]) 03/2013  . Pulmonary HTN ([place]) 07/05/2017  . Seizure ([place]) 07/10/2017  . Seizure disorder ([place]) 07/05/2017  . Seizures ([place])   . Sepsis ([place]) 07/10/2017  . Sleep apnea   . Status epilepticus ([place])   . 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 Tachypnea   . Thyroid disease    Hypothyroid  . Tracheostomy, acute management ([place])   . Type II diabetes mellitus ([place])     Past Surgical History:  Procedure Laterality  Date  . ABDOMINAL HYSTERECTOMY  1970s  . BREAST BIOPSY Left 2006  . BREAST LUMPECTOMY Left 2006  . BUNIONECTOMY WITH HAMMERTOE RECONSTRUCTION Left   . CARDIAC CATHETERIZATION  06/2020  . ESOPHAGOGASTRODUODENOSCOPY N/A 05/29/2017   Procedure: ESOPHAGOGASTRODUODENOSCOPY (EGD);  Surgeon: Irene Shipper, MD;  Location: [facility];  Service: Endoscopy;  Laterality: N/A;  . JOINT REPLACEMENT    . LAPAROSCOPIC CHOLECYSTECTOMY  ~ 2005  . Pulmonary function test  2-3 weeks ago  . RIGHT/LEFT HEART CATH AND CORONARY ANGIOGRAPHY N/A 05/24/2020   Procedure: RIGHT/LEFT HEART CATH AND CORONARY ANGIOGRAPHY;  Surgeon: Jolaine Artist, MD;  Location: [facility];  Service: Cardiovascular;  Laterality: N/A;  . TOTAL HIP ARTHROPLASTY Left 2010  . TOTAL KNEE ARTHROPLASTY Bilateral 2005-2006    There were no vitals filed for this visit.   Subjective Assessment - 07/26/20 1404    Subjective Saw the pulmonologist today, she reports very tired and fatigued.  Has a CT scan in the future, a sleep study in the future, and was given a new medication that she will be taking once a day.  She is to continue PT and we are trying to keep her O2 above 88% if we need to we can increase O2 to 5L    Currently in Pain? No/denies                             [facility] Adult PT Treatment/Exercise - 07/26/20 0001      Ambulation/Gait   Gait Comments stairs 4" and 6" steps up and down, gait x 110 feet did not have as much SOB, WE did one more 100 foot walk and her battery on the oxygen went out, as she had been out most of the day with MD appointments      High Level Balance   High Level Balance Activities Side stepping    High Level Balance Comments on airex 2 way scap stab, and 6" toe touches      Lumbar Exercises: Aerobic   Nustep level 4 x 500 steps, took 8 minutes 20 seconds )2 dropped to 89%      Lumbar Exercises: Standing   Other Standing Lumbar Exercises 3# marching, extension and hip abduction and HS curls with walker 2x10       Lumbar Exercises: Seated   Long Arc Quad on Chair Both;2 sets;10 reps    Other Seated Lumbar Exercises marching 3# 2x10 each, 2# bicep curls                    PT Short Term Goals - 12/04/19 1627      PT SHORT TERM GOAL #1   Title independent with intial HEP    Status Achieved             PT Long Term Goals - 07/22/20 1454      PT LONG TERM GOAL #1   Title increase overall hip LE strength for functional gait and safety hips and ankles to 4+/5    Status Partially Met      PT LONG TERM GOAL #2   Title decrease TUG time to less than 14 seconds for functional gait and safety    Status  Achieved      PT LONG TERM GOAL #3   Title increase BERG to 50/56 for safety and independence    Status Partially Met  Plan - 07/26/20 1448    Clinical Impression Statement Patient saw the pulmonologist, we have some guidance, she was started on a new medication today.  She will be scheduled for a sleep study and for a High Resolution CT.  We are to try to keep her oxygen above 88%, we can bump up to 5L O2 if needed with exertion.  Today's session we were b/n 94% and 88%, she had less fatigue with today's session, She does report that she took the medication today, reports that she feels jittery.  She did have the O2 battery run out towards the end of the session today    PT Next Visit Plan keep O2 above 88% can go to 5L if needed    Consulted and Agree with Plan of Care Patient           Patient will benefit from skilled therapeutic intervention in order to improve the following deficits and impairments:  Abnormal gait,Decreased range of motion,Difficulty walking,Increased muscle spasms,Decreased activity tolerance,Pain,Decreased balance,Impaired flexibility,Improper body mechanics,Postural dysfunction,Decreased strength,Decreased mobility  Visit Diagnosis: Muscle weakness (generalized)  Risk for falls  Difficulty in walking, not elsewhere classified  Physical debility  Acute bilateral low back pain without sciatica  Other abnormalities of gait and mobility  Muscle spasm of back     Problem List Patient Active Problem List   Diagnosis Date Noted  . COPD (chronic obstructive pulmonary disease) ([place]) 07/26/2020  . Pulmonary nodule 07/26/2020  . Abnormal CT of the abdomen   . Type II diabetes mellitus ([place])   . Thyroid disease   . Seizures ([place])   . Hypertension   . Hyperlipidemia   . GERD (gastroesophageal reflux disease)   . Diverticulosis   . Depression   . Complication of anesthesia   . Arthritis   . Hypoxia 03/23/2020  . Acute cystitis  with hematuria 12/19/2019  . Hypomagnesemia 07/11/2017  . Nausea vomiting and diarrhea 07/10/2017  . Hypokalemia 07/10/2017  . Chronic diastolic CHF (congestive heart failure) ([place]) 07/10/2017  . Seizure ([place]) 07/10/2017  . OSA (obstructive sleep apnea) 07/10/2017  . Sepsis ([place]) 07/10/2017  . Seizure disorder ([place]) 07/05/2017  . Pulmonary HTN ([place]) 07/05/2017  . Anxiety state   . Physical debility 06/23/2017  . 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 History of acute respiratory failure   . Tracheostomy, acute management ([place])   . Encephalopathy   . Status epilepticus ([place])   . Benign essential HTN   . 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 History of pulmonary embolism   . Acute blood loss anemia   . Diverticulitis of colon   . Tachypnea   . Encounter for orogastric (OG) tube placement   . Encounter for central line placement   . Encounter for attention to tracheostomy ([place])   . Chronic respiratory failure with hypoxia ([place])   . Acute encephalopathy 06/06/2017  . Diverticulitis of large intestine without perforation or abscess without bleeding   . Abdominal pain   . Non-intractable cyclical vomiting with nausea   . Hypocalcemia 05/27/2017  . 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 Colitis 05/27/2017  . Central retinal vein occlusion of right eye 04/20/2016  . Nuclear sclerotic cataract of both eyes 04/20/2016  . Diabetic foot infection ([place]) 07/21/2015  . Diabetes mellitus type 2, controlled ([place]) 07/21/2015  . Cellulitis of foot, right 07/21/2015  . Cellulitis of right foot 07/21/2015  . Acute pulmonary embolism ([place]) 04/01/2013  . DVT (deep venous thrombosis) ([place]) 04/01/2013  .  HX: breast cancer 04/01/2013  . Concussion 04/01/2013  . Diabetes ([place]) 04/01/2013  . Essential hypertension, benign 04/01/2013  . Hypothyroidism 04/01/2013  . Pulmonary embolism ([place]) 03/2013  . Cancer of left breast [facility]) 2006    [name] Boast., PT 07/26/2020, 2:53 PM  [place]. [place], [name], [address] Phone: [phone]   Fax:  [phone]  Name: Amanda Short MRN: [id number] Date of Birth: [birth year]

## 2020-07-26 NOTE — Progress Notes (Signed)
Subjective:    Patient ID: Amanda Short, female    DOB: [birth year], 80 y.o.   MRN: [id number]  HPI 80 year old former smoker (30 pack years) with a history of obstructive sleep apnea previously on on BiPAP/CPAP, COPD, remote thromboembolic disease with PE, systemic hypertension, associated severe secondary pulmonary hypertension, hypoxemic respiratory failure on 2 L/min, previously with tracheostomy (Dec[place]ulated).  Also with a history of breast cancer, diabetes mellitus.  Right heart cath 05/24/2020 PAP 92/36 (56), PVR 15, PAOP 11  Pulmonary function testing done 06/21/2020 reviewed by me, shows moderately severe obstruction, possible associated restriction.  She had difficulty performing the lung volume maneuver.  Decreased diffusion capacity that does not correct when adjusted for alveolar volume (pulmonary vascular)  VQ scan 8/21, normal CT abdomen 2/21 reviewed by me, showed a right lower lobe calcified granuloma.  A high-resolution CT chest is pending RF, Scl-70, ANCA all negative on 05/14/20  She tells me that she has exertional and resting dyspnea.  No cough, no sputum.  Occasionally hears wheezing Most recently she was on CPAP 8 cmH2O, stopped it about 4-5 months ago. Home PSG ordered and pending to establish OSA. She has used several masks, has trouble keeping in place. The tubing was cumbersome. Sometimes mask would come off in the night    Review of Systems As per HPI  Past Medical History:  Diagnosis Date  . Abdominal pain   . Abnormal CT of the abdomen   . Acute blood loss anemia   . Acute cystitis with hematuria 12/19/2019  . Acute encephalopathy 06/06/2017  . Acute pulmonary embolism ([place]) 04/01/2013  . Acute respiratory failure with hypoxia ([place])   . Anxiety state   . Arthritis    "back, hands" (07/22/2015)  . Aspiration pneumonia ([place])   . Benign essential HTN   . Cancer of left breast ([place]) 2006   S/P lumpectomy  . Cellulitis of foot, right 07/21/2015  .  Cellulitis of right foot 07/21/2015  . Central retinal vein occlusion of right eye 04/20/2016  . Chronic diastolic CHF (congestive heart failure) ([place]) 07/10/2017  . Colitis 05/27/2017  . Complication of anesthesia    "brief breathing problem at surgery center in ~ 2005 when I had gallbladder OR"  . Concussion 03/2013   due to fall  . Depression   . Diabetes ([place]) 04/01/2013  . 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 Diabetes mellitus type 2, controlled ([place]) 07/21/2015  . Diabetic foot infection ([place]) 07/21/2015  . Diverticulitis of colon   . Diverticulitis of large intestine without perforation or abscess without bleeding   . Diverticulosis   . DVT (deep venous thrombosis) ([place]) 03/2013   LLE  . Encephalopathy   . Encounter for attention to tracheostomy ([place])   . Encounter for central line placement   . Encounter for orogastric (OG) tube placement   . Essential hypertension, benign 04/01/2013  . GERD (gastroesophageal reflux disease)   . History of acute respiratory failure   . History of pulmonary embolism   . HX: breast cancer 04/01/2013  . Hyperlipidemia   . Hypertension   . Hypocalcemia 05/27/2017  . Hypokalemia 07/10/2017  . Hypomagnesemia 07/11/2017  . Hypothyroidism   . Hypoxia 03/23/2020  . Nausea vomiting and diarrhea 07/10/2017  . Non-intractable cyclical vomiting with nausea   . Nuclear sclerotic cataract of both eyes 04/20/2016  . OSA (obstructive sleep apnea) 07/10/2017  . OSA on CPAP 04/01/2013  . OSA treated with BiPAP   .  Physical debility 06/23/2017  . 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 Pulmonary embolism ([place]) 03/2013  . Pulmonary HTN ([place]) 07/05/2017  . Seizure ([place]) 07/10/2017  . Seizure disorder ([place]) 07/05/2017  . Seizures ([place])   . Sepsis ([place]) 07/10/2017  . Sleep apnea   . Status epilepticus ([place])   . 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 Tachypnea   . Thyroid disease    Hypothyroid  . Tracheostomy, acute management ([place])   . Type II diabetes  mellitus (HCC)      Family History  Problem Relation Age of Onset  . Diabetes Other   . Breast cancer Sister   . Liver cancer Mother   . Uterine cancer Mother   . Hypertension Mother   . Colon cancer Maternal Grandmother   . Breast cancer Cousin        couple  . Kidney disease Sister   . Diabetes Sister      Social History   Socioeconomic History  . Marital status: Married    Spouse name: Not on file  . Number of children: 1  . Years of education: Not on file  . Highest education level: Not on file  Occupational History  . Occupation: retired  Tobacco Use  . Smoking status: Former Smoker    Packs/day: 1.00    Years: 30.00    Pack years: 30.00    Types: Cigarettes    Quit date: 1995    Years since quitting: 27.0  . Smokeless tobacco: Never Used  . Tobacco comment: "quit smoking cigarettes in the 1990s"  Vaping Use  . Vaping Use: Never used  Substance and Sexual Activity  . Alcohol use: No  . Drug use: No  . Sexual activity: Not Currently  Other Topics Concern  . Not on file  Social History Narrative  . Not on file   Social Determinants of Health   Financial Resource Strain: Low Risk   . Difficulty of Paying Living Expenses: Not hard at all  Food Insecurity: No Food Insecurity  . Worried About Charity fundraiser in the Last Year: Never true  . Ran Out of Food in the Last Year: Never true  Transportation Needs: No Transportation Needs  . Lack of Transportation (Medical): No  . Lack of Transportation (Non-Medical): No  Physical Activity: Insufficiently Active  . Days of Exercise per Week: 4 days  . Minutes of Exercise per Session: 30 min  Stress: No Stress Concern Present  . Feeling of Stress : Not at all  Social Connections: Moderately Isolated  . Frequency of Communication with Friends and Family: More than three times a week  . Frequency of Social Gatherings with Friends and Family: Never  . Attends Religious Services: Never  . Active Member of Clubs  or Organizations: No  . Attends Archivist Meetings: Never  . Marital Status: Married  Human resources officer Violence: Not At Risk  . Fear of Current or Ex-Partner: No  . Emotionally Abused: No  . Physically Abused: No  . Sexually Abused: No     Allergies  Allergen Reactions  . Allopurinol Nausea And Vomiting  . Aspirin     Causes jitters     Outpatient Medications Prior to Visit  Medication Sig Dispense Refill  . acetaminophen (TYLENOL) 500 MG tablet Take 500-1,000 mg by mouth every 6 (six) hours as needed for mild pain.     Marland Kitchen ALPRAZolam (XANAX) 0.5 MG tablet TAKE 1 TABLET BY MOUTH EVERYDAY AT BEDTIME 30 tablet  4  . atorvastatin (LIPITOR) 20 MG tablet Take 20 mg by mouth daily.    . calcium-vitamin D (OSCAL WITH D) 500-200 MG-UNIT tablet Take 1 tablet by mouth 2 (two) times daily.    Marland Kitchen diltiazem (CARDIZEM) 30 MG tablet Take 30 mg by mouth 2 (two) times daily.     . DULoxetine (CYMBALTA) 60 MG capsule Take 60 mg by mouth daily.    . febuxostat (ULORIC) 40 MG tablet Take 40 mg daily by mouth.    . furosemide (LASIX) 40 MG tablet Take 40 mg by mouth daily.    . insulin degludec (TRESIBA FLEXTOUCH) 100 UNIT/ML SOPN FlexTouch Pen Inject 26 Units into the skin daily.     Marland Kitchen levETIRAcetam (KEPPRA) 500 MG tablet Take 500 mg by mouth 2 (two) times daily.    Marland Kitchen levothyroxine (SYNTHROID, LEVOTHROID) 100 MCG tablet Take 100 mcg by mouth daily before breakfast.    . loratadine (CLARITIN) 10 MG tablet Take 10 mg by mouth daily as needed for allergies.    . metoprolol (TOPROL-XL) 200 MG 24 hr tablet Take 100 mg by mouth at bedtime.     . montelukast (SINGULAIR) 10 MG tablet Take 10 mg at bedtime by mouth.    . Multiple Vitamin (MULTIVITAMIN WITH MINERALS) TABS tablet Take 1 tablet daily by mouth.    . nateglinide (STARLIX) 120 MG tablet Take 120 mg by mouth daily.     Marland Kitchen omeprazole (PRILOSEC) 40 MG capsule Take 40 mg by mouth 2 (two) times daily.    . OXYGEN Inhale 4 L/min into the lungs.     Marland Kitchen OZEMPIC, 0.25 OR 0.5 MG/DOSE, 2 MG/1.5ML SOPN INJECT 0.5 MG WEEKLY 4.5 mL 4  . Rivaroxaban (XARELTO) 15 MG TABS tablet Take 15 mg by mouth daily.    Marland Kitchen spironolactone (ALDACTONE) 25 MG tablet Take 12.5 mg by mouth daily.     No facility-administered medications prior to visit.         Objective:   Physical Exam Vitals:   07/26/20 1128  BP: 110/70  Pulse: 69  Temp: (!) 97.3 F (36.3 C)  TempSrc: Temporal  SpO2: 91%  Weight: 157 lb 3.2 oz (71.3 kg)  Height: _0  (1.549 m)   Gen: Pleasant, overweight woman, in no distress,  normal affect  ENT: No lesions,  mouth clear,  oropharynx clear, no postnasal drip  Neck: No JVD, no stridor  Lungs: No use of accessory muscles, distant no crackles or wheezing on normal respiration, no wheeze on forced expiration  Cardiovascular: RRR, heart sounds normal with prominent S2, no murmur or gallops, no peripheral edema  Musculoskeletal: No deformities, no cyanosis or clubbing  Neuro: alert, awake, non focal  Skin: Warm, no lesions or rash      Assessment & Plan:  Chronic respiratory failure with hypoxia (HCC) Both due to and also a contributor to her PAH.  Need to keep her adequately oxygenated to the best of our ability.  She is working with physical therapy, has desaturations into the 80s, also occurs with long walks.  We will try to uptitrate her oxygen is much as possible on her pulse system, probably to 5 L/min with exertion  Pulmonary HTN ([place]) Multifactorial secondary pulmonary hypertension.  Need to work to address all potential contributors including daytime oxygenation, nocturnal oxygenation with OSA/OHS.  Systemic hypertension, history of thromboembolic disease (on anticoagulation).  Titrate oxygen appropriately.  A home sleep study is pending, suspect she will need a formal CPAP titration study  to optimize pressures and mask.  Continue her anticoagulation.  A CT chest is pending to look for interstitial lung  disease-autoimmune screening has been negative so far.  Her pulmonary function testing shows obstructive lung disease and she has not been treated for COPD.  We will try Stiolto to see if she gets benefit.  I suspect that if we can get her on bronchodilator therapy then she would be a reasonable candidate for Tyvaso if this was deemed to be the most appropriate medication to treat her PAH.  Can discuss with cardiology.  COPD (chronic obstructive pulmonary disease) ([place]) Based on her tobacco history and also her PFT.  I believe she might benefit from a trial of bronchodilator therapy.  We will try Stiolto to see if she gets benefit.  Follow-up in about 1 month to assess.  Pulmonary nodule Calcified right lower lobe pulmonary nodule, benign and likely due to prior granulomatous disease.  Baltazar Apo, MD, PhD 07/26/2020, 12:20 PM [facility] [phone] or if no answer [phone]

## 2020-07-26 NOTE — Assessment & Plan Note (Signed)
Calcified right lower lobe pulmonary nodule, benign and likely due to prior granulomatous disease.

## 2020-07-26 NOTE — Addendum Note (Signed)
Addended by: Vanessa Barbara on: 07/26/2020 01:38 PM   Modules accepted: Orders

## 2020-07-26 NOTE — Assessment & Plan Note (Signed)
Multifactorial secondary pulmonary hypertension.  Need to work to address all potential contributors including daytime oxygenation, nocturnal oxygenation with OSA/OHS.  Systemic hypertension, history of thromboembolic disease (on anticoagulation).  Titrate oxygen appropriately.  A home sleep study is pending, suspect she will need a formal CPAP titration study to optimize pressures and mask.  Continue her anticoagulation.  A CT chest is pending to look for interstitial lung disease-autoimmune screening has been negative so far.  Her pulmonary function testing shows obstructive lung disease and she has not been treated for COPD.  We will try Stiolto to see if she gets benefit.  I suspect that if we can get her on bronchodilator therapy then she would be a reasonable candidate for Tyvaso if this was deemed to be the most appropriate medication to treat her PAH.  Can discuss with cardiology.

## 2020-07-26 NOTE — Patient Instructions (Addendum)
Agree with home sleep study to confirm your obstructive sleep apnea.  I suspect that you will ultimately require a CPAP/BiPAP titration study in the sleep lab to determine what pressure you will require and to find the best fitting mask We will try starting Stiolto 2 puffs once daily.  Keep track of whether this medication helps your breathing. Agree with your oxygen at 4 L/min.  If possible increase to 5 L/min with walking, heavier exertion like your physical therapy.  Our goal is to keep your oxygen saturations > 88% as much as possible. Get your high-resolution CT scan of the chest as planned. We will review your testing, your response to inhaled medication, with doctors Bensimhon and Agustin Cree to determine which pulmonary hypertension medications might be most beneficial. Follow with Dr Lamonte Sakai in 1 month or next available

## 2020-07-26 NOTE — Assessment & Plan Note (Signed)
Both due to and also a contributor to her PAH.  Need to keep her adequately oxygenated to the best of our ability.  She is working with physical therapy, has desaturations into the 80s, also occurs with long walks.  We will try to uptitrate her oxygen is much as possible on her pulse system, probably to 5 L/min with exertion

## 2020-07-26 NOTE — Assessment & Plan Note (Signed)
Based on her tobacco history and also her PFT.  I believe she might benefit from a trial of bronchodilator therapy.  We will try Stiolto to see if she gets benefit.  Follow-up in about 1 month to assess.

## 2020-07-27 ENCOUNTER — Ambulatory Visit: Payer: Medicare PPO | Admitting: Family Medicine

## 2020-07-27 NOTE — Telephone Encounter (Signed)
Please see patients mychart message  I was pleased with meeting you and our visit interaction.  I feel more confident about treatment now and confident that with your guidance my condition can be helped.  Thank you and I look forward to working with you on my case.

## 2020-07-29 ENCOUNTER — Encounter: Payer: Self-pay | Admitting: Physical Therapy

## 2020-07-29 ENCOUNTER — Other Ambulatory Visit: Payer: Self-pay

## 2020-07-29 ENCOUNTER — Ambulatory Visit: Payer: Medicare PPO | Admitting: Physical Therapy

## 2020-07-29 DIAGNOSIS — M62830 Muscle spasm of back: Secondary | ICD-10-CM

## 2020-07-29 DIAGNOSIS — R5381 Other malaise: Secondary | ICD-10-CM

## 2020-07-29 DIAGNOSIS — R262 Difficulty in walking, not elsewhere classified: Secondary | ICD-10-CM

## 2020-07-29 DIAGNOSIS — M6281 Muscle weakness (generalized): Secondary | ICD-10-CM | POA: Diagnosis not present

## 2020-07-29 DIAGNOSIS — Z9181 History of falling: Secondary | ICD-10-CM

## 2020-07-29 DIAGNOSIS — M545 Low back pain, unspecified: Secondary | ICD-10-CM

## 2020-07-29 DIAGNOSIS — R2689 Other abnormalities of gait and mobility: Secondary | ICD-10-CM

## 2020-07-29 NOTE — Therapy (Signed)
[place] [place], [name], [address] Phone: [phone]   Fax:  [phone]  Physical Therapy Treatment  Patient Details  Name: Vienna Folden MRN: [id number] Date of Birth: 10/17/1940 Referring Provider (PT): Rennard   Encounter Date: 07/29/2020   PT End of Session - 07/29/20 1622    Visit Number 55    Number of Visits 21    Date for PT Re-Evaluation 08/20/20    Authorization Type [employer]    PT Start Time 4585    PT Stop Time 1441    PT Time Calculation (min) 46 min    Activity Tolerance Patient tolerated treatment well    Behavior During Therapy [facility] for tasks assessed/performed           Past Medical History:  Diagnosis Date  . Abdominal pain   . Abnormal CT of the abdomen   . Acute blood loss anemia   . Acute cystitis with hematuria 12/19/2019  . Acute encephalopathy 06/06/2017  . Acute pulmonary embolism ([place]) 04/01/2013  . Acute respiratory failure with hypoxia ([place])   . Anxiety state   . Arthritis    "back, hands" (07/22/2015)  . Aspiration pneumonia ([place])   . Benign essential HTN   . Cancer of left breast ([place]) 2006   S/P lumpectomy  . Cellulitis of foot, right 07/21/2015  . Cellulitis of right foot 07/21/2015  . Central retinal vein occlusion of right eye 04/20/2016  . Chronic diastolic CHF (congestive heart failure) ([place]) 07/10/2017  . Colitis 05/27/2017  . Complication of anesthesia    "brief breathing problem at surgery center in ~ 2005 when I had gallbladder OR"  . Concussion 03/2013   due to fall  . Depression   . Diabetes ([place]) 04/01/2013  . 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 Diabetes mellitus type 2, controlled ([place]) 07/21/2015  . Diabetic foot infection ([place]) 07/21/2015  . Diverticulitis of colon   . Diverticulitis of large intestine without perforation or abscess without bleeding   . Diverticulosis   . DVT (deep venous thrombosis) ([place]) 03/2013   LLE  . Encephalopathy    . Encounter for attention to tracheostomy ([place])   . Encounter for central line placement   . Encounter for orogastric (OG) tube placement   . Essential hypertension, benign 04/01/2013  . GERD (gastroesophageal reflux disease)   . History of acute respiratory failure   . History of pulmonary embolism   . HX: breast cancer 04/01/2013  . Hyperlipidemia   . Hypertension   . Hypocalcemia 05/27/2017  . Hypokalemia 07/10/2017  . Hypomagnesemia 07/11/2017  . Hypothyroidism   . Hypoxia 03/23/2020  . Nausea vomiting and diarrhea 07/10/2017  . Non-intractable cyclical vomiting with nausea   . Nuclear sclerotic cataract of both eyes 04/20/2016  . OSA (obstructive sleep apnea) 07/10/2017  . OSA on CPAP 04/01/2013  . OSA treated with BiPAP   . Physical debility 06/23/2017  . 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 Pulmonary embolism ([place]) 03/2013  . Pulmonary HTN ([place]) 07/05/2017  . Seizure ([place]) 07/10/2017  . Seizure disorder ([place]) 07/05/2017  . Seizures ([place])   . Sepsis ([place]) 07/10/2017  . Sleep apnea   . Status epilepticus ([place])   . 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 Tachypnea   . Thyroid disease    Hypothyroid  . Tracheostomy, acute management ([place])   . Type II diabetes mellitus ([place])     Past Surgical History:  Procedure Laterality  Date  . ABDOMINAL HYSTERECTOMY  1970s  . BREAST BIOPSY Left 2006  . BREAST LUMPECTOMY Left 2006  . BUNIONECTOMY WITH HAMMERTOE RECONSTRUCTION Left   . CARDIAC CATHETERIZATION  06/2020  . ESOPHAGOGASTRODUODENOSCOPY N/A 05/29/2017   Procedure: ESOPHAGOGASTRODUODENOSCOPY (EGD);  Surgeon: Irene Shipper, MD;  Location: [facility];  Service: Endoscopy;  Laterality: N/A;  . JOINT REPLACEMENT    . LAPAROSCOPIC CHOLECYSTECTOMY  ~ 2005  . Pulmonary function test  2-3 weeks ago  . RIGHT/LEFT HEART CATH AND CORONARY ANGIOGRAPHY N/A 05/24/2020   Procedure: RIGHT/LEFT HEART CATH AND CORONARY ANGIOGRAPHY;  Surgeon: Jolaine Artist, MD;  Location: [facility];  Service: Cardiovascular;  Laterality: N/A;  . TOTAL HIP ARTHROPLASTY Left 2010  . TOTAL KNEE ARTHROPLASTY Bilateral 2005-2006    There were no vitals filed for this visit.   Subjective Assessment - 07/29/20 1400    Subjective Patient reports still jittery after taking medicine, feels like when she is short of breath it is not as loig and she recovers better.    Currently in Pain? No/denies                             [facility] Adult PT Treatment/Exercise - 07/29/20 0001      Ambulation/Gait   Gait Comments stairs up and down step over step 4" and 6", walking light HHA x115 feet and then rest O2 91% c/o fatigue in her legs and then HHA walk x 80 feet      High Level Balance   High Level Balance Comments on airex balance beam in the parallel bars O2 dropped to 88%, side steppping on the balance beam, on airex red tband two ways, and 6" toe touches      Lumbar Exercises: Aerobic   Nustep level 5 500 steps alittle over 9 minutes      Lumbar Exercises: Machines for Strengthening   Cybex Knee Extension 5# 3x10    Cybex Knee Flexion 25# 3x10                    PT Short Term Goals - 12/04/19 1627      PT SHORT TERM GOAL #1   Title independent with intial HEP    Status Achieved             PT Long Term Goals - 07/29/20 1624      PT LONG TERM GOAL #2   Title decrease TUG time to less than 14 seconds for functional gait and safety    Status Achieved      PT LONG TERM GOAL #3   Title increase BERG to 50/56 for safety and independence    Status Partially Met      PT LONG TERM GOAL #4   Title decrease LBP 50% with activities    Status Achieved      PT LONG TERM GOAL #5   Title increase lumbar ROM 25%    Status Achieved                 Plan - 07/29/20 1623    Clinical Impression Statement Patient did well with the high level balance, she struggled with c/o fatigue in her legs, her O2 saturation remained 88%-93% during the session.   She does go slow and during the balance activities she tends to drop more in the oxygen due to concentration and holding her breath    PT Next  Visit Plan keep O2 above 88% can go to 5L if needed    Consulted and Agree with Plan of Care Patient           Patient will benefit from skilled therapeutic intervention in order to improve the following deficits and impairments:  Abnormal gait,Decreased range of motion,Difficulty walking,Increased muscle spasms,Decreased activity tolerance,Pain,Decreased balance,Impaired flexibility,Improper body mechanics,Postural dysfunction,Decreased strength,Decreased mobility  Visit Diagnosis: Muscle weakness (generalized)  Risk for falls  Difficulty in walking, not elsewhere classified  Physical debility  Acute bilateral low back pain without sciatica  Other abnormalities of gait and mobility  Muscle spasm of back     Problem List Patient Active Problem List   Diagnosis Date Noted  . COPD (chronic obstructive pulmonary disease) ([place]) 07/26/2020  . Pulmonary nodule 07/26/2020  . Abnormal CT of the abdomen   . Type II diabetes mellitus ([place])   . Thyroid disease   . Seizures ([place])   . Hypertension   . Hyperlipidemia   . GERD (gastroesophageal reflux disease)   . Diverticulosis   . Depression   . Complication of anesthesia   . Arthritis   . Hypoxia 03/23/2020  . Acute cystitis with hematuria 12/19/2019  . Hypomagnesemia 07/11/2017  . Nausea vomiting and diarrhea 07/10/2017  . Hypokalemia 07/10/2017  . Chronic diastolic CHF (congestive heart failure) ([place]) 07/10/2017  . Seizure ([place]) 07/10/2017  . OSA (obstructive sleep apnea) 07/10/2017  . Sepsis ([place]) 07/10/2017  . Seizure disorder ([place]) 07/05/2017  . Pulmonary HTN ([place]) 07/05/2017  . Anxiety state   . Physical debility 06/23/2017  . 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 History of acute respiratory failure   . Tracheostomy, acute management ([place])   . Encephalopathy   . Status  epilepticus ([place])   . Benign essential HTN   . 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 History of pulmonary embolism   . Acute blood loss anemia   . Diverticulitis of colon   . Tachypnea   . Encounter for orogastric (OG) tube placement   . Encounter for central line placement   . Encounter for attention to tracheostomy ([place])   . Chronic respiratory failure with hypoxia ([place])   . Acute encephalopathy 06/06/2017  . Diverticulitis of large intestine without perforation or abscess without bleeding   . Abdominal pain   . Non-intractable cyclical vomiting with nausea   . Hypocalcemia 05/27/2017  . 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 Colitis 05/27/2017  . Central retinal vein occlusion of right eye 04/20/2016  . Nuclear sclerotic cataract of both eyes 04/20/2016  . Diabetic foot infection ([place]) 07/21/2015  . Diabetes mellitus type 2, controlled ([place]) 07/21/2015  . Cellulitis of foot, right 07/21/2015  . Cellulitis of right foot 07/21/2015  . Acute pulmonary embolism ([place]) 04/01/2013  . DVT (deep venous thrombosis) ([place]) 04/01/2013  . HX: breast cancer 04/01/2013  . Concussion 04/01/2013  . Diabetes ([place]) 04/01/2013  . Essential hypertension, benign 04/01/2013  . Hypothyroidism 04/01/2013  . Pulmonary embolism ([place]) 03/2013  . Cancer of left breast [facility]) 2006    [name] Boast., PT 07/29/2020, 4:25 PM  [place]. [place], [name], [address] Phone: [phone]   Fax:  [phone]  Name: Levetta Bognar MRN: [id number] Date of Birth: [birth year]

## 2020-08-03 ENCOUNTER — Ambulatory Visit: Payer: Medicare PPO | Admitting: Physical Therapy

## 2020-08-05 ENCOUNTER — Ambulatory Visit: Payer: Medicare PPO | Admitting: Physical Therapy

## 2020-08-05 ENCOUNTER — Other Ambulatory Visit: Payer: Self-pay

## 2020-08-05 ENCOUNTER — Encounter: Payer: Self-pay | Admitting: Physical Therapy

## 2020-08-05 DIAGNOSIS — M62830 Muscle spasm of back: Secondary | ICD-10-CM

## 2020-08-05 DIAGNOSIS — R2689 Other abnormalities of gait and mobility: Secondary | ICD-10-CM

## 2020-08-05 DIAGNOSIS — M6281 Muscle weakness (generalized): Secondary | ICD-10-CM | POA: Diagnosis not present

## 2020-08-05 DIAGNOSIS — Z9181 History of falling: Secondary | ICD-10-CM

## 2020-08-05 DIAGNOSIS — R262 Difficulty in walking, not elsewhere classified: Secondary | ICD-10-CM

## 2020-08-05 DIAGNOSIS — M545 Low back pain, unspecified: Secondary | ICD-10-CM

## 2020-08-05 DIAGNOSIS — R5381 Other malaise: Secondary | ICD-10-CM

## 2020-08-05 NOTE — Therapy (Signed)
[place]. [place], [name], [address] Phone: [phone]   Fax:  [phone]  Physical Therapy Treatment  Patient Details  Name: Amanda Short MRN: [id number] Date of Birth: 01-02-1941 Referring Provider (PT): Rennard   Encounter Date: 08/05/2020   PT End of Session - 08/05/20 1534    Visit Number 58    Number of Visits 74    Date for PT Re-Evaluation 08/20/20    Authorization Type [employer]    PT Start Time 0034    PT Stop Time 1520    PT Time Calculation (min) 42 min    Activity Tolerance Patient tolerated treatment well    Behavior During Therapy [facility] for tasks assessed/performed           Past Medical History:  Diagnosis Date  . Abdominal pain   . Abnormal CT of the abdomen   . Acute blood loss anemia   . Acute cystitis with hematuria 12/19/2019  . Acute encephalopathy 06/06/2017  . Acute pulmonary embolism ([place]) 04/01/2013  . Acute respiratory failure with hypoxia ([place])   . Anxiety state   . Arthritis    "back, hands" (07/22/2015)  . Aspiration pneumonia ([place])   . Benign essential HTN   . Cancer of left breast ([place]) 2006   S/P lumpectomy  . Cellulitis of foot, right 07/21/2015  . Cellulitis of right foot 07/21/2015  . Central retinal vein occlusion of right eye 04/20/2016  . Chronic diastolic CHF (congestive heart failure) ([place]) 07/10/2017  . Colitis 05/27/2017  . Complication of anesthesia    "brief breathing problem at surgery center in ~ 2005 when I had gallbladder OR"  . Concussion 03/2013   due to fall  . Depression   . Diabetes ([place]) 04/01/2013  . 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 Diabetes mellitus type 2, controlled ([place]) 07/21/2015  . Diabetic foot infection ([place]) 07/21/2015  . Diverticulitis of colon   . Diverticulitis of large intestine without perforation or abscess without bleeding   . Diverticulosis   . DVT (deep venous thrombosis) ([place]) 03/2013   LLE  . Encephalopathy    . Encounter for attention to tracheostomy ([place])   . Encounter for central line placement   . Encounter for orogastric (OG) tube placement   . Essential hypertension, benign 04/01/2013  . GERD (gastroesophageal reflux disease)   . History of acute respiratory failure   . History of pulmonary embolism   . HX: breast cancer 04/01/2013  . Hyperlipidemia   . Hypertension   . Hypocalcemia 05/27/2017  . Hypokalemia 07/10/2017  . Hypomagnesemia 07/11/2017  . Hypothyroidism   . Hypoxia 03/23/2020  . Nausea vomiting and diarrhea 07/10/2017  . Non-intractable cyclical vomiting with nausea   . Nuclear sclerotic cataract of both eyes 04/20/2016  . OSA (obstructive sleep apnea) 07/10/2017  . OSA on CPAP 04/01/2013  . OSA treated with BiPAP   . Physical debility 06/23/2017  . 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 Pulmonary embolism ([place]) 03/2013  . Pulmonary HTN ([place]) 07/05/2017  . Seizure ([place]) 07/10/2017  . Seizure disorder ([place]) 07/05/2017  . Seizures ([place])   . Sepsis ([place]) 07/10/2017  . Sleep apnea   . Status epilepticus ([place])   . 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 Tachypnea   . Thyroid disease    Hypothyroid  . Tracheostomy, acute management ([place])   . Type II diabetes mellitus ([place])     Past Surgical History:  Procedure Laterality  Date  . ABDOMINAL HYSTERECTOMY  1970s  . BREAST BIOPSY Left 2006  . BREAST LUMPECTOMY Left 2006  . BUNIONECTOMY WITH HAMMERTOE RECONSTRUCTION Left   . CARDIAC CATHETERIZATION  06/2020  . ESOPHAGOGASTRODUODENOSCOPY N/A 05/29/2017   Procedure: ESOPHAGOGASTRODUODENOSCOPY (EGD);  Surgeon: Irene Shipper, MD;  Location: [facility];  Service: Endoscopy;  Laterality: N/A;  . JOINT REPLACEMENT    . LAPAROSCOPIC CHOLECYSTECTOMY  ~ 2005  . Pulmonary function test  2-3 weeks ago  . RIGHT/LEFT HEART CATH AND CORONARY ANGIOGRAPHY N/A 05/24/2020   Procedure: RIGHT/LEFT HEART CATH AND CORONARY ANGIOGRAPHY;  Surgeon: Jolaine Artist, MD;  Location: [facility];  Service: Cardiovascular;  Laterality: N/A;  . TOTAL HIP ARTHROPLASTY Left 2010  . TOTAL KNEE ARTHROPLASTY Bilateral 2005-2006    There were no vitals filed for this visit.   Subjective Assessment - 08/05/20 1447    Subjective Patient reports that she had 2 very good days and then yesterday and today not as good, her hands are cold and I had some difficulty getting O2 saturation to read on the pulse ox.  She and her husband report low blood sugar today    Currently in Pain? No/denies                             [facility] Adult PT Treatment/Exercise - 08/05/20 0001      Ambulation/Gait   Gait Comments walking 100 feet x 5, O2 from 89% to 92% with 4L O2      High Level Balance   High Level Balance Comments on airex balance beam in the parallel bars side steppping on the balance beam, on airex red tband two ways, and 6" toe touches      Lumbar Exercises: Aerobic   Nustep level 4 500 steps alittle over 9 minutes      Lumbar Exercises: Machines for Strengthening   Cybex Knee Extension 5# 3x10    Cybex Knee Flexion 25# 3x10                    PT Short Term Goals - 12/04/19 1627      PT SHORT TERM GOAL #1   Title independent with intial HEP    Status Achieved             PT Long Term Goals - 08/05/20 1546      PT LONG TERM GOAL #1   Title increase overall hip LE strength for functional gait and safety hips and ankles to 4+/5    Status Partially Met      PT LONG TERM GOAL #2   Title decrease TUG time to less than 14 seconds for functional gait and safety    Status Achieved                 Plan - 08/05/20 1541    Clinical Impression Statement I did more walking today, I struggled to get the puls ox to read today possibly due to cold fingers, I wanted to assure that we had a valid reading before doing much as the MD goals are to keep O2 saturation above 88%.  She did okay today reports that she has had some dizziness but reports that she  has been having low blood sugar.  The walking does seem to really fatigue her legs and her breathing.    PT Next Visit Plan I will see her 1x next week.  She will have a high resolution CT scan the following week.  We will see about [place] continuing our treatment, will need to ask for moree visits           Patient will benefit from skilled therapeutic intervention in order to improve the following deficits and impairments:  Abnormal gait,Decreased range of motion,Difficulty walking,Increased muscle spasms,Decreased activity tolerance,Pain,Decreased balance,Impaired flexibility,Improper body mechanics,Postural dysfunction,Decreased strength,Decreased mobility  Visit Diagnosis: Muscle weakness (generalized)  Risk for falls  Difficulty in walking, not elsewhere classified  Physical debility  Acute bilateral low back pain without sciatica  Other abnormalities of gait and mobility  Muscle spasm of back     Problem List Patient Active Problem List   Diagnosis Date Noted  . COPD (chronic obstructive pulmonary disease) ([place]) 07/26/2020  . Pulmonary nodule 07/26/2020  . Abnormal CT of the abdomen   . Type II diabetes mellitus ([place])   . Thyroid disease   . Seizures ([place])   . Hypertension   . Hyperlipidemia   . GERD (gastroesophageal reflux disease)   . Diverticulosis   . Depression   . Complication of anesthesia   . Arthritis   . Hypoxia 03/23/2020  . Acute cystitis with hematuria 12/19/2019  . Hypomagnesemia 07/11/2017  . Nausea vomiting and diarrhea 07/10/2017  . Hypokalemia 07/10/2017  . Chronic diastolic CHF (congestive heart failure) ([place]) 07/10/2017  . Seizure ([place]) 07/10/2017  . OSA (obstructive sleep apnea) 07/10/2017  . Sepsis ([place]) 07/10/2017  . Seizure disorder ([place]) 07/05/2017  . Pulmonary HTN ([place]) 07/05/2017  . Anxiety state   . Physical debility 06/23/2017  . 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 History of acute respiratory failure   . Tracheostomy,  acute management ([place])   . Encephalopathy   . Status epilepticus ([place])   . Benign essential HTN   . 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 History of pulmonary embolism   . Acute blood loss anemia   . Diverticulitis of colon   . Tachypnea   . Encounter for orogastric (OG) tube placement   . Encounter for central line placement   . Encounter for attention to tracheostomy ([place])   . Chronic respiratory failure with hypoxia ([place])   . Acute encephalopathy 06/06/2017  . Diverticulitis of large intestine without perforation or abscess without bleeding   . Abdominal pain   . Non-intractable cyclical vomiting with nausea   . Hypocalcemia 05/27/2017  . 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 Colitis 05/27/2017  . Central retinal vein occlusion of right eye 04/20/2016  . Nuclear sclerotic cataract of both eyes 04/20/2016  . Diabetic foot infection ([employer]) 07/21/2015  . Diabetes mellitus type 2, controlled ([place]) 07/21/2015  . Cellulitis of foot, right 07/21/2015  . Cellulitis of right foot 07/21/2015  . Acute pulmonary embolism ([place]) 04/01/2013  . DVT (deep venous thrombosis) ([place]) 04/01/2013  . HX: breast cancer 04/01/2013  . Concussion 04/01/2013  . Diabetes ([place]) 04/01/2013  . Essential hypertension, benign 04/01/2013  . Hypothyroidism 04/01/2013  . Pulmonary embolism ([place]) 03/2013  . Cancer of left breast [facility]) 2006    [name] Boast., PT 08/05/2020, 3:47 PM  [place]. [place], [name], [address] Phone: [phone]   Fax:  [phone]  Name: Shiryl Ruddy MRN: [id number] Date of Birth: [birth year]

## 2020-08-10 ENCOUNTER — Ambulatory Visit: Payer: Medicare PPO | Admitting: Physical Therapy

## 2020-08-10 ENCOUNTER — Encounter: Payer: Self-pay | Admitting: Physical Therapy

## 2020-08-10 ENCOUNTER — Other Ambulatory Visit: Payer: Self-pay

## 2020-08-10 DIAGNOSIS — Z9181 History of falling: Secondary | ICD-10-CM

## 2020-08-10 DIAGNOSIS — M6281 Muscle weakness (generalized): Secondary | ICD-10-CM | POA: Diagnosis not present

## 2020-08-10 DIAGNOSIS — R2689 Other abnormalities of gait and mobility: Secondary | ICD-10-CM

## 2020-08-10 DIAGNOSIS — R262 Difficulty in walking, not elsewhere classified: Secondary | ICD-10-CM

## 2020-08-10 DIAGNOSIS — M62830 Muscle spasm of back: Secondary | ICD-10-CM

## 2020-08-10 DIAGNOSIS — R5381 Other malaise: Secondary | ICD-10-CM

## 2020-08-10 DIAGNOSIS — M545 Low back pain, unspecified: Secondary | ICD-10-CM

## 2020-08-10 NOTE — Therapy (Signed)
[place]. [place], [name], [address] Phone: [phone]   Fax:  [phone]  Physical Therapy Treatment  Patient Details  Name: Amanda Short MRN: [id number] Date of Birth: March 31, 1941 Referring Provider (PT): Rennard   Encounter Date: 08/10/2020   PT End of Session - 08/10/20 1710    Visit Number 40    Date for PT Re-Evaluation 08/20/20    Authorization Type [employer]    PT Start Time 1352    PT Stop Time 1435    PT Time Calculation (min) 43 min    Activity Tolerance Patient limited by fatigue    Behavior During Therapy [facility] for tasks assessed/performed           Past Medical History:  Diagnosis Date  . Abdominal pain   . Abnormal CT of the abdomen   . Acute blood loss anemia   . Acute cystitis with hematuria 12/19/2019  . Acute encephalopathy 06/06/2017  . Acute pulmonary embolism ([place]) 04/01/2013  . Acute respiratory failure with hypoxia ([place])   . Anxiety state   . Arthritis    "back, hands" (07/22/2015)  . Aspiration pneumonia ([place])   . Benign essential HTN   . Cancer of left breast ([place]) 2006   S/P lumpectomy  . Cellulitis of foot, right 07/21/2015  . Cellulitis of right foot 07/21/2015  . Central retinal vein occlusion of right eye 04/20/2016  . Chronic diastolic CHF (congestive heart failure) ([place]) 07/10/2017  . Colitis 05/27/2017  . Complication of anesthesia    "brief breathing problem at surgery center in ~ 2005 when I had gallbladder OR"  . Concussion 03/2013   due to fall  . Depression   . Diabetes ([place]) 04/01/2013  . 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 Diabetes mellitus type 2, controlled ([place]) 07/21/2015  . Diabetic foot infection ([place]) 07/21/2015  . Diverticulitis of colon   . Diverticulitis of large intestine without perforation or abscess without bleeding   . Diverticulosis   . DVT (deep venous thrombosis) ([place]) 03/2013   LLE  . Encephalopathy   . Encounter for attention to  tracheostomy ([place])   . Encounter for central line placement   . Encounter for orogastric (OG) tube placement   . Essential hypertension, benign 04/01/2013  . GERD (gastroesophageal reflux disease)   . History of acute respiratory failure   . History of pulmonary embolism   . HX: breast cancer 04/01/2013  . Hyperlipidemia   . Hypertension   . Hypocalcemia 05/27/2017  . Hypokalemia 07/10/2017  . Hypomagnesemia 07/11/2017  . Hypothyroidism   . Hypoxia 03/23/2020  . Nausea vomiting and diarrhea 07/10/2017  . Non-intractable cyclical vomiting with nausea   . Nuclear sclerotic cataract of both eyes 04/20/2016  . OSA (obstructive sleep apnea) 07/10/2017  . OSA on CPAP 04/01/2013  . OSA treated with BiPAP   . Physical debility 06/23/2017  . 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 Pulmonary embolism ([place]) 03/2013  . Pulmonary HTN ([place]) 07/05/2017  . Seizure ([place]) 07/10/2017  . Seizure disorder ([place]) 07/05/2017  . Seizures ([place])   . Sepsis ([place]) 07/10/2017  . Sleep apnea   . Status epilepticus ([place])   . 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 Tachypnea   . Thyroid disease    Hypothyroid  . Tracheostomy, acute management ([place])   . Type II diabetes mellitus ([place])     Past Surgical History:  Procedure Laterality Date  . ABDOMINAL HYSTERECTOMY  1970s  .  BREAST BIOPSY Left 2006  . BREAST LUMPECTOMY Left 2006  . BUNIONECTOMY WITH HAMMERTOE RECONSTRUCTION Left   . CARDIAC CATHETERIZATION  06/2020  . ESOPHAGOGASTRODUODENOSCOPY N/A 05/29/2017   Procedure: ESOPHAGOGASTRODUODENOSCOPY (EGD);  Surgeon: Irene Shipper, MD;  Location: [facility];  Service: Endoscopy;  Laterality: N/A;  . JOINT REPLACEMENT    . LAPAROSCOPIC CHOLECYSTECTOMY  ~ 2005  . Pulmonary function test  2-3 weeks ago  . RIGHT/LEFT HEART CATH AND CORONARY ANGIOGRAPHY N/A 05/24/2020   Procedure: RIGHT/LEFT HEART CATH AND CORONARY ANGIOGRAPHY;  Surgeon: Jolaine Artist, MD;  Location: [facility];  Service: Cardiovascular;   Laterality: N/A;  . TOTAL HIP ARTHROPLASTY Left 2010  . TOTAL KNEE ARTHROPLASTY Bilateral 2005-2006    There were no vitals filed for this visit.   Subjective Assessment - 08/10/20 1356    Subjective Patient reports that she fell coming out of the bathroom, reports that she tripped on the flip flops she was wearing, reports sore in the abdomen and the knees.    Currently in Pain? Yes    Pain Location Knee    Pain Orientation Right;Left    Pain Descriptors / Indicators Sore    Aggravating Factors  falling yesterday                             [facility] Adult PT Treatment/Exercise - 08/10/20 0001      Ambulation/Gait   Gait Comments stairs up and over 2x and she became very fatigued O2 levels 88%, gait 110 feet x2      High Level Balance   High Level Balance Activities Side stepping      Lumbar Exercises: Aerobic   Nustep level 5 with cues for her to try above 60 reps / minute, she struggled with this.      Lumbar Exercises: Standing   Row Both;Theraband;20 reps    Theraband Level (Row) Level 2 (Red)    Row Limitations on airex      Lumbar Exercises: Seated   Other Seated Lumbar Exercises red tband ankle exercises 2x10 all motions bilatrally                    PT Short Term Goals - 12/04/19 1627      PT SHORT TERM GOAL #1   Title independent with intial HEP    Status Achieved             PT Long Term Goals - 08/10/20 1717      PT LONG TERM GOAL #1   Title increase overall hip LE strength for functional gait and safety hips and ankles to 4+/5    Status Partially Met      PT LONG TERM GOAL #2   Title decrease TUG time to less than 14 seconds for functional gait and safety    Status Achieved      PT LONG TERM GOAL #3   Title increase BERG to 50/56 for safety and independence    Status Partially Met      PT LONG TERM GOAL #4   Title decrease LBP 50% with activities    Status Achieved      PT LONG TERM GOAL #5   Title increase  lumbar ROM 25%    Status Achieved                 Plan - 08/10/20 1711    Clinical Impression Statement Working with  the pulse oximeter, we started at 95% on 4L O2, with the stairs she dropped to 88%, with walking she would be at 90% with her feeling fatigue and shortness of breath, I have continued to follow her and work with her to help with her function and quality of life, she has seen the pulmonologist and the cardiologist, she has had a change in medication that seems to help her some with the shortnes of breath.  She will be having a high resolution CT scan of the heart and lungs, she will also be scheduled for a sleep studyall trying to find help with her primary Dx of the pulmonary HTN and the CHF.  Her ability to walk has been doing better with the fatigue, she did have a fall yesterday that seems to have set her back some.  She has c/o some abdominal pain and knee pain with the fall.    PT Next Visit Plan Will ask to start seeing her again the weak of February 7th, she should have the results of CT scan    Consulted and Agree with Plan of Care Patient           Patient will benefit from skilled therapeutic intervention in order to improve the following deficits and impairments:  Abnormal gait,Decreased range of motion,Difficulty walking,Increased muscle spasms,Decreased activity tolerance,Pain,Decreased balance,Impaired flexibility,Improper body mechanics,Postural dysfunction,Decreased strength,Decreased mobility  Visit Diagnosis: Muscle weakness (generalized)  Risk for falls  Difficulty in walking, not elsewhere classified  Physical debility  Acute bilateral low back pain without sciatica  Other abnormalities of gait and mobility  Muscle spasm of back     Problem List Patient Active Problem List   Diagnosis Date Noted  . COPD (chronic obstructive pulmonary disease) ([place]) 07/26/2020  . Pulmonary nodule 07/26/2020  . Abnormal CT of the abdomen   . Type II  diabetes mellitus ([place])   . Thyroid disease   . Seizures ([place])   . Hypertension   . Hyperlipidemia   . GERD (gastroesophageal reflux disease)   . Diverticulosis   . Depression   . Complication of anesthesia   . Arthritis   . Hypoxia 03/23/2020  . Acute cystitis with hematuria 12/19/2019  . Hypomagnesemia 07/11/2017  . Nausea vomiting and diarrhea 07/10/2017  . Hypokalemia 07/10/2017  . Chronic diastolic CHF (congestive heart failure) ([place]) 07/10/2017  . Seizure ([place]) 07/10/2017  . OSA (obstructive sleep apnea) 07/10/2017  . Sepsis ([place]) 07/10/2017  . Seizure disorder ([place]) 07/05/2017  . Pulmonary HTN ([place]) 07/05/2017  . Anxiety state   . Physical debility 06/23/2017  . 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 History of acute respiratory failure   . Tracheostomy, acute management ([place])   . Encephalopathy   . Status epilepticus ([place])   . Benign essential HTN   . 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 History of pulmonary embolism   . Acute blood loss anemia   . Diverticulitis of colon   . Tachypnea   . Encounter for orogastric (OG) tube placement   . Encounter for central line placement   . Encounter for attention to tracheostomy ([place])   . Chronic respiratory failure with hypoxia ([place])   . Acute encephalopathy 06/06/2017  . Diverticulitis of large intestine without perforation or abscess without bleeding   . Abdominal pain   . Non-intractable cyclical vomiting with nausea   . Hypocalcemia 05/27/2017  . 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 Colitis 05/27/2017  . Central retinal vein occlusion of right  eye 04/20/2016  . Nuclear sclerotic cataract of both eyes 04/20/2016  . Diabetic foot infection ([place]) 07/21/2015  . Diabetes mellitus type 2, controlled ([place]) 07/21/2015  . Cellulitis of foot, right 07/21/2015  . Cellulitis of right foot 07/21/2015  . Acute pulmonary embolism ([place]) 04/01/2013  . DVT (deep venous thrombosis) ([place]) 04/01/2013  . HX: breast cancer  04/01/2013  . Concussion 04/01/2013  . Diabetes ([place]) 04/01/2013  . Essential hypertension, benign 04/01/2013  . Hypothyroidism 04/01/2013  . Pulmonary embolism ([place]) 03/2013  . Cancer of left breast [facility]) 2006    [name] Boast., PT 08/10/2020, 5:21 PM  [place]. [place], [name], [address] Phone: [phone]   Fax:  [phone]  Name: Amanda Short MRN: [id number] Date of Birth: 05/08/1941

## 2020-08-11 ENCOUNTER — Telehealth (HOSPITAL_COMMUNITY): Payer: Self-pay | Admitting: Cardiology

## 2020-08-11 NOTE — Telephone Encounter (Signed)
Called and spoke with patient, advised that I had received a message from [facility] with the HF clinic and that she was doing well on the Stiolto inhaler.  She is starting on the second sample.  Advised that she has a prescription that has been sent to her pharmacy when she needs it.  Advised that I would make Dr. Lamonte Sakai aware.  Dr. Lamonte Sakai, Just an FYI that patient is doing well on the new inhaler, Stiolto.

## 2020-08-11 NOTE — Telephone Encounter (Signed)
Pt called to report she had started the new inhaler (respimat) Reports she is doing well after 2 weeks, denies any side effects or reactions. Pt would like to continue   Advised I would forward message over to pulm to address further as this is the HF clinic. Pt appreciative of returned call.

## 2020-08-12 NOTE — Telephone Encounter (Signed)
Thank you very much

## 2020-08-17 ENCOUNTER — Ambulatory Visit: Payer: Medicare PPO | Admitting: Physical Therapy

## 2020-08-18 ENCOUNTER — Other Ambulatory Visit: Payer: Self-pay

## 2020-08-18 ENCOUNTER — Ambulatory Visit (HOSPITAL_COMMUNITY)
Admission: RE | Admit: 2020-08-18 | Discharge: 2020-08-18 | Disposition: A | Discharge status: Home / Self Care | Payer: Medicare PPO | Source: Ambulatory Visit | Attending: Internal Medicine | Admitting: Internal Medicine

## 2020-08-18 DIAGNOSIS — I5032 Chronic diastolic (congestive) heart failure: Secondary | ICD-10-CM | POA: Diagnosis not present

## 2020-08-19 MED ORDER — STIOLTO RESPIMAT 2.5-2.5 MCG/ACT IN AERS
2.0000 | INHALATION_SPRAY | Freq: Every day | RESPIRATORY_TRACT | 5 refills | Status: DC
Start: 2020-08-19 — End: 2021-02-15

## 2020-08-21 NOTE — Progress Notes (Signed)
Heart Failure TeleHealth Note  Due to national recommendations of social distancing due to [place] 19, Audio/video telehealth visit is felt to be most appropriate for this patient at this time.  See MyChart message from today for patient consent regarding telehealth for [facility]. The patient was identified personally using two identifiers.   Date:  08/21/2020   ID:  [name], DOB June 29, 1941, MRN [id number]  Location: Home  Provider location: [facility] Type of Visit: Established patient  PCP:  Haydee Salter, MD  Cardiologist:  No primary care provider on file. Primary HF: [place]  Chief Complaint: PAH follow-up   History of Present Illness:  Bell Harkleroadis a 80 y.o.femalewith DM2, HTN. OSA previously on CPAP), previous smoker (quit 1995), previous DVT/PE (2014) and h/o chronic respiratory failure s/p previous tracheostomy (removed) referred by Dr. Drue Dun for further evaluation of pulmonary HTN seen on echo.   In 2018 had severe n/v/d and led to intractable seizures. Got extubated but then developed recurrent distress but could not be re-intubated due to laryngeal swelling. Had emergent trach which was removed after several weeks. Smoked 1ppd x 20+ years. Quit in 1995.   Echo 7/21: LVEF 65-705 with restrictive diastolic filling  RV moderately enlarged with moderate HK. Mod to severe TR. Severe septal flattening RVSP estimated at 108.  CT abdomen 2/21: showed calcified granuloma in RLL VQ scan 8/21: Normal  I saw her for the first time in 10/21 and recommended R/L cath.   R/L 05/24/20 Minimal CAD (20%). Severe PAH Ao = 150/81 (109) LV = 150/18 RA = 12 RV = 94/13 PA = 92/36 (56) PCW = 11 Fick cardiac output/index = 3.0/1.8 PVR = 15.1 WU FA sat = 96% PA sat = 61%, 60%  PFTs  06/21/20 FEV1  1.11L (65%) FVC   1.65L  (71%) FEF 25-75  0.61L DLCO 35%  Post cath I recommended trial of Tyvaso but I wanted to  maximize her oxygenation. O2 increased, Sleep study and Hi-res CT ordered. Referred to Pulmonary. Saw Dr. Lamonte Sakai and Stiolto added for COPD. O2 increased.   HiRes chest CT 08/18/20: suggestive of early ILD and emphysema. Also with LM and 3v coronary calcifications,   Presents today for a telehealth visit. Was initially scheduled for in-office visit but has had diarrhea and weakness for 2 days so requested a televisit. Says her breathing is much better with the inhalers No edema, orthopnea or PND. Now wearing 4L O2. When walking around house will go into 80s  Aribelle Malek denies symptoms worrisome for COVID 19.   Past Medical History:  Diagnosis Date  . Abdominal pain   . Abnormal CT of the abdomen   . Acute blood loss anemia   . Acute cystitis with hematuria 12/19/2019  . Acute encephalopathy 06/06/2017  . Acute pulmonary embolism ([place]) 04/01/2013  . Acute respiratory failure with hypoxia ([place])   . Anxiety state   . Arthritis    "back, hands" (07/22/2015)  . Aspiration pneumonia ([place])   . Benign essential HTN   . Cancer of left breast ([place]) 2006   S/P lumpectomy  . Cellulitis of foot, right 07/21/2015  . Cellulitis of right foot 07/21/2015  . Central retinal vein occlusion of right eye 04/20/2016  . Chronic diastolic CHF (congestive heart failure) ([place]) 07/10/2017  . Colitis 05/27/2017  . Complication of anesthesia    "brief breathing problem at surgery center in ~ 2005 when I had gallbladder OR"  .  Concussion 03/2013   due to fall  . Depression   . Diabetes ([place]) 04/01/2013  . 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 Diabetes mellitus type 2, controlled ([place]) 07/21/2015  . Diabetic foot infection ([place]) 07/21/2015  . Diverticulitis of colon   . Diverticulitis of large intestine without perforation or abscess without bleeding   . Diverticulosis   . DVT (deep venous thrombosis) ([place]) 03/2013   LLE  . Encephalopathy   . Encounter for attention to tracheostomy ([place])   . Encounter  for central line placement   . Encounter for orogastric (OG) tube placement   . Essential hypertension, benign 04/01/2013  . GERD (gastroesophageal reflux disease)   . History of acute respiratory failure   . History of pulmonary embolism   . HX: breast cancer 04/01/2013  . Hyperlipidemia   . Hypertension   . Hypocalcemia 05/27/2017  . Hypokalemia 07/10/2017  . Hypomagnesemia 07/11/2017  . Hypothyroidism   . Hypoxia 03/23/2020  . Nausea vomiting and diarrhea 07/10/2017  . Non-intractable cyclical vomiting with nausea   . Nuclear sclerotic cataract of both eyes 04/20/2016  . OSA (obstructive sleep apnea) 07/10/2017  . OSA on CPAP 04/01/2013  . OSA treated with BiPAP   . Physical debility 06/23/2017  . 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 Pulmonary embolism ([place]) 03/2013  . Pulmonary HTN ([place]) 07/05/2017  . Seizure ([place]) 07/10/2017  . Seizure disorder ([place]) 07/05/2017  . Seizures ([place])   . Sepsis ([place]) 07/10/2017  . Sleep apnea   . Status epilepticus ([place])   . 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 Tachypnea   . Thyroid disease    Hypothyroid  . Tracheostomy, acute management ([place])   . Type II diabetes mellitus ([place])    Past Surgical History:  Procedure Laterality Date  . ABDOMINAL HYSTERECTOMY  1970s  . BREAST BIOPSY Left 2006  . BREAST LUMPECTOMY Left 2006  . BUNIONECTOMY WITH HAMMERTOE RECONSTRUCTION Left   . CARDIAC CATHETERIZATION  06/2020  . ESOPHAGOGASTRODUODENOSCOPY N/A 05/29/2017   Procedure: ESOPHAGOGASTRODUODENOSCOPY (EGD);  Surgeon: Irene Shipper, MD;  Location: [facility];  Service: Endoscopy;  Laterality: N/A;  . JOINT REPLACEMENT    . LAPAROSCOPIC CHOLECYSTECTOMY  ~ 2005  . Pulmonary function test  2-3 weeks ago  . RIGHT/LEFT HEART CATH AND CORONARY ANGIOGRAPHY N/A 05/24/2020   Procedure: RIGHT/LEFT HEART CATH AND CORONARY ANGIOGRAPHY;  Surgeon: Jolaine Artist, MD;  Location: [facility];  Service: Cardiovascular;  Laterality: N/A;  . TOTAL HIP  ARTHROPLASTY Left 2010  . TOTAL KNEE ARTHROPLASTY Bilateral 2005-2006     Current Outpatient Medications  Medication Sig Dispense Refill  . acetaminophen (TYLENOL) 500 MG tablet Take 500-1,000 mg by mouth every 6 (six) hours as needed for mild pain.     Marland Kitchen ALPRAZolam (XANAX) 0.5 MG tablet TAKE 1 TABLET BY MOUTH EVERYDAY AT BEDTIME 30 tablet 4  . atorvastatin (LIPITOR) 20 MG tablet Take 20 mg by mouth daily.    . calcium-vitamin D (OSCAL WITH D) 500-200 MG-UNIT tablet Take 1 tablet by mouth 2 (two) times daily.    Marland Kitchen diltiazem (CARDIZEM) 30 MG tablet Take 30 mg by mouth 2 (two) times daily.     . DULoxetine (CYMBALTA) 60 MG capsule Take 60 mg by mouth daily.    . febuxostat (ULORIC) 40 MG tablet Take 40 mg daily by mouth.    . furosemide (LASIX) 40 MG tablet Take 40 mg by mouth daily.    . insulin degludec (  TRESIBA FLEXTOUCH) 100 UNIT/ML SOPN FlexTouch Pen Inject 26 Units into the skin daily.     Marland Kitchen levETIRAcetam (KEPPRA) 500 MG tablet Take 500 mg by mouth 2 (two) times daily.    Marland Kitchen levothyroxine (SYNTHROID, LEVOTHROID) 100 MCG tablet Take 100 mcg by mouth daily before breakfast.    . loratadine (CLARITIN) 10 MG tablet Take 10 mg by mouth daily as needed for allergies.    . metoprolol (TOPROL-XL) 200 MG 24 hr tablet Take 100 mg by mouth at bedtime.     . montelukast (SINGULAIR) 10 MG tablet Take 10 mg at bedtime by mouth.    . Multiple Vitamin (MULTIVITAMIN WITH MINERALS) TABS tablet Take 1 tablet daily by mouth.    . nateglinide (STARLIX) 120 MG tablet Take 120 mg by mouth daily.     Marland Kitchen omeprazole (PRILOSEC) 40 MG capsule Take 40 mg by mouth 2 (two) times daily.    . OXYGEN Inhale 4 L/min into the lungs.    Marland Kitchen OZEMPIC, 0.25 OR 0.5 MG/DOSE, 2 MG/1.5ML SOPN INJECT 0.5 MG WEEKLY 4.5 mL 4  . Rivaroxaban (XARELTO) 15 MG TABS tablet Take 15 mg by mouth daily.    Marland Kitchen spironolactone (ALDACTONE) 25 MG tablet Take 12.5 mg by mouth daily.    . Tiotropium Bromide-Olodaterol (STIOLTO RESPIMAT) 2.5-2.5 MCG/ACT  AERS Inhale 2 puffs into the lungs daily. 4 g 5   No current facility-administered medications for this encounter.    Allergies:   Allopurinol and Aspirin   Social History:  The patient  reports that she quit smoking about 27 years ago. Her smoking use included cigarettes. She has a 30.00 pack-year smoking history. She has never used smokeless tobacco. She reports that she does not drink alcohol and does not use drugs.   Family History:  The patient's family history includes Breast cancer in her cousin and sister; Colon cancer in her maternal grandmother; Diabetes in her sister and another family member; Hypertension in her mother; Kidney disease in her sister; Liver cancer in her mother; Uterine cancer in her mother.   ROS:  Please see the history of present illness.   All other systems are personally reviewed and negative.   BP 130/79   Exam:  (Video/Tele Health Call; Exam is subjective and or/visual.) General:  Speaks in full sentences. No resp difficulty. Lungs: Normal respiratory effort with conversation.  Abdomen: Non-distended per patient report Extremities: Pt denies edema. Neuro: Alert & oriented x 3.   Recent Labs: 02/16/2020: ALT 8; TSH 0.190 05/14/2020: Platelets 248 05/24/2020: Hemoglobin 13.9 07/13/2020: BUN 32; Creatinine, Ser 1.26; Potassium 4.9; Sodium 143  Personally reviewed   Wt Readings from Last 3 Encounters:  07/26/20 71.3 kg (157 lb 3.2 oz)  07/13/20 72.1 kg (159 lb)  07/05/20 69.9 kg (154 lb)      ASSESSMENT AND PLAN:   1. Pulmonary HTN with cor pulmonale, severe  - Echo 7/21: LVEF 65-70% with restrictive diastolic filling  RV moderately enlarged with moderate HK. Mod to severe TR. Severe septal flattening RVSP estimated at 108.  - CT abdomen 2/21: showed calcified granuloma in RLL - VQ scan 8/21: Normal - RHC 11/21 with severe PAH PA = 92/36 (56) PCW = 11 CI 1.8 PVR 15 - Continue supplemental O2. Now wearing 4L O2 with improved saturations  - PFTs  12/21 show mild to moderate obstruction but no hyperinflation.  - Need to restart CPAP (needs repeat sleep study) - Auto-immune serology negative - HiRes chest CT 08/18/20: suggestive of early  ILD and emphysema. 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 - She has benefited Stiolto and increased O2 supplementation. Will see her back in 2 months and cosndier Tyvaso  2. Chronic hypoxic respiratory failure - quit tobacco 1995 - on home O2 now 3L -> 4L - has h/o tracheostomy - PFTs 12/21 show mild to moderate obstruction but no hyperinflation.  - HiRes chest CT 08/18/20: suggestive of early ILD and emphysema. Also with LM and 3v coronary calcifications,  - Following with Dr. Lamonte Sakai in Pulmonary. Has benefitted from [place]  3. OSA - need to restart CPAP  - will repeat sleep study as not done for many years. This has been ordered but not performed due to insurance approval   COVID screen The patient does not have any symptoms that suggest any further testing/ screening at this time.  Social distancing reinforced today.  Recommended follow-up:  As above  Relevant cardiac medications were reviewed at length with the patient today.   The patient does not have concerns regarding their medications at this time.   The following changes were made today:  As above  Today, I have spent 13 minutes with the patient with telehealth technology discussing the above issues .    Signed,  Glori Bickers, MD  1:48 PM

## 2020-08-23 ENCOUNTER — Other Ambulatory Visit: Payer: Self-pay

## 2020-08-23 ENCOUNTER — Ambulatory Visit: Payer: Medicare PPO | Admitting: Family Medicine

## 2020-08-23 ENCOUNTER — Ambulatory Visit (HOSPITAL_COMMUNITY)
Admission: RE | Admit: 2020-08-23 | Discharge: 2020-08-23 | Disposition: A | Discharge status: Home / Self Care | Payer: Medicare PPO | Source: Ambulatory Visit | Attending: Internal Medicine | Admitting: Internal Medicine

## 2020-08-23 ENCOUNTER — Encounter (HOSPITAL_COMMUNITY): Payer: Self-pay | Admitting: Internal Medicine

## 2020-08-23 DIAGNOSIS — I2510 Atherosclerotic heart disease of native coronary artery without angina pectoris: Secondary | ICD-10-CM | POA: Diagnosis not present

## 2020-08-23 DIAGNOSIS — G4733 Obstructive sleep apnea (adult) (pediatric): Secondary | ICD-10-CM

## 2020-08-23 DIAGNOSIS — J9611 Chronic respiratory failure with hypoxia: Secondary | ICD-10-CM

## 2020-08-23 DIAGNOSIS — I272 Pulmonary hypertension, unspecified: Secondary | ICD-10-CM

## 2020-08-26 ENCOUNTER — Other Ambulatory Visit: Payer: Self-pay

## 2020-08-27 ENCOUNTER — Ambulatory Visit: Payer: Medicare PPO | Admitting: Family Medicine

## 2020-08-27 ENCOUNTER — Encounter: Payer: Self-pay | Admitting: Family Medicine

## 2020-08-27 VITALS — BP 118/70 | HR 71 | Temp 96.1°F | Ht 61.0 in | Wt 158.2 lb

## 2020-08-27 DIAGNOSIS — E1122 Type 2 diabetes mellitus with diabetic chronic kidney disease: Secondary | ICD-10-CM

## 2020-08-27 DIAGNOSIS — N182 Chronic kidney disease, stage 2 (mild): Secondary | ICD-10-CM

## 2020-08-27 DIAGNOSIS — I10 Essential (primary) hypertension: Secondary | ICD-10-CM | POA: Diagnosis not present

## 2020-08-27 DIAGNOSIS — I5032 Chronic diastolic (congestive) heart failure: Secondary | ICD-10-CM

## 2020-08-27 DIAGNOSIS — I272 Pulmonary hypertension, unspecified: Secondary | ICD-10-CM

## 2020-08-27 DIAGNOSIS — E039 Hypothyroidism, unspecified: Secondary | ICD-10-CM

## 2020-08-27 NOTE — Progress Notes (Signed)
[facility] [address] [place] [address] Dept: [phone] Dept Fax: [phone]  Acute Office Visit  Subjective:    Patient ID: Amanda Short, female    DOB: 03/15/1941, 80 y.o..   MRN: [id number]  Chief Complaint  Patient presents with  . Transitions Of Care    TOC from Dr. Ethelene Hal, no concerns.     History of Present Illness:  Patient is in today with her husband for routine reassessment. Amanda Short has a complex medical history involving pulmonary hypertension, subsequent heart failure, hypertension, Type 2 diabetes, and stage 3 CKD. She is followed by Dr. Haroldine Short (cardiology) and Dr. Lamonte [facility] (pulmonology). She is on home )2 at 4 l/min. Recently a Stiolto inhaler (tiotropium/olodaterol) was added to her regimen. Otherwise, she is feeling stable with no new complaints.  Past Medical History: Patient Active Problem List   Diagnosis Date Noted  . COPD (chronic obstructive pulmonary disease) ([place]) 07/26/2020  . Pulmonary nodule 07/26/2020  . Abnormal CT of the abdomen   . Thyroid disease   . Seizures ([place])   . Hyperlipidemia   . GERD (gastroesophageal reflux disease)   . Diverticulosis   . Depression   . Complication of anesthesia   . Arthritis   . Hypoxia 03/23/2020  . Acute cystitis with hematuria 12/19/2019  . Hypomagnesemia 07/11/2017  . Nausea vomiting and diarrhea 07/10/2017  . Hypokalemia 07/10/2017  . Chronic diastolic CHF (congestive heart failure) ([place]) 07/10/2017  . Seizure ([place]) 07/10/2017  . OSA (obstructive sleep apnea) 07/10/2017  . Sepsis ([place]) 07/10/2017  . Seizure disorder ([place]) 07/05/2017  . Pulmonary hypertension ([place]) 07/05/2017  . Anxiety state   . Physical debility 06/23/2017  . 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 History of acute respiratory failure   . Tracheostomy, acute management ([place])   . Status epilepticus ([place])   . Benign essential HTN   . Acute blood loss  anemia   . Diverticulitis of colon   . Tachypnea   . Encounter for orogastric (OG) tube placement   . Encounter for central line placement   . Encounter for attention to tracheostomy ([place])   . Chronic respiratory failure with hypoxia ([place])   . Acute encephalopathy 06/06/2017  . Diverticulitis of large intestine without perforation or abscess without bleeding   . Abdominal pain   . Non-intractable cyclical vomiting with nausea   . Hypocalcemia 05/27/2017  . 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 Colitis 05/27/2017  . Central retinal vein occlusion of right eye 04/20/2016  . Nuclear sclerotic cataract of both eyes 04/20/2016  . Diabetic foot infection ([place]) 07/21/2015  . Diabetes mellitus type 2, controlled ([place]) 07/21/2015  . Cellulitis of foot, right 07/21/2015  . Acute pulmonary embolism ([place]) 04/01/2013  . DVT (deep venous thrombosis) ([place]) 04/01/2013  . HX: breast cancer 04/01/2013  . Concussion 04/01/2013  . Essential hypertension, benign 04/01/2013  . Hypothyroidism 04/01/2013  . Cancer of left breast ([place]) 2006   Past Surgical History:  Procedure Laterality Date  . ABDOMINAL HYSTERECTOMY  1970s  . BREAST BIOPSY Left 2006  . BREAST LUMPECTOMY Left 2006  . BUNIONECTOMY WITH HAMMERTOE RECONSTRUCTION Left   . CARDIAC CATHETERIZATION  06/2020  . ESOPHAGOGASTRODUODENOSCOPY N/A 05/29/2017   Procedure: ESOPHAGOGASTRODUODENOSCOPY (EGD);  Surgeon: Irene Shipper, MD;  Location: [facility];  Service: Endoscopy;  Laterality: N/A;  . JOINT REPLACEMENT    . LAPAROSCOPIC CHOLECYSTECTOMY  ~ 2005  . Pulmonary function test  2-3 weeks ago  . RIGHT/LEFT  HEART CATH AND CORONARY ANGIOGRAPHY N/A 05/24/2020   Procedure: RIGHT/LEFT HEART CATH AND CORONARY ANGIOGRAPHY;  Surgeon: Jolaine Artist, MD;  Location: [facility];  Service: Cardiovascular;  Laterality: N/A;  . TOTAL HIP ARTHROPLASTY Left 2010  . TOTAL KNEE ARTHROPLASTY Bilateral 2005-2006   Family History  Problem Relation  Age of Onset  . Diabetes Other   . Breast cancer Sister   . Liver cancer Mother   . Uterine cancer Mother   . Hypertension Mother   . Colon cancer Maternal Grandmother   . Breast cancer Cousin        couple  . Kidney disease Sister   . Diabetes Sister    Outpatient Medications Prior to Visit  Medication Sig Dispense Refill  . acetaminophen (TYLENOL) 500 MG tablet Take 500-1,000 mg by mouth every 6 (six) hours as needed for mild pain.     Marland Kitchen ALPRAZolam (XANAX) 0.5 MG tablet TAKE 1 TABLET BY MOUTH EVERYDAY AT BEDTIME 30 tablet 4  . atorvastatin (LIPITOR) 20 MG tablet Take 20 mg by mouth daily.    . calcium-vitamin D (OSCAL WITH D) 500-200 MG-UNIT tablet Take 1 tablet by mouth 2 (two) times daily.    Marland Kitchen diltiazem (CARDIZEM) 30 MG tablet Take 30 mg by mouth 2 (two) times daily.     . DULoxetine (CYMBALTA) 60 MG capsule Take 60 mg by mouth daily.    . febuxostat (ULORIC) 40 MG tablet Take 40 mg daily by mouth.    . furosemide (LASIX) 40 MG tablet Take 40 mg by mouth daily.    . insulin degludec (TRESIBA FLEXTOUCH) 100 UNIT/ML SOPN FlexTouch Pen Inject 26 Units into the skin daily.     Marland Kitchen levETIRAcetam (KEPPRA) 500 MG tablet Take 500 mg by mouth 2 (two) times daily.    Marland Kitchen levothyroxine (SYNTHROID, LEVOTHROID) 100 MCG tablet Take 100 mcg by mouth daily before breakfast.    . loratadine (CLARITIN) 10 MG tablet Take 10 mg by mouth daily as needed for allergies.    . metoprolol (TOPROL-XL) 200 MG 24 hr tablet Take 100 mg by mouth at bedtime.     . montelukast (SINGULAIR) 10 MG tablet Take 10 mg at bedtime by mouth.    . Multiple Vitamin (MULTIVITAMIN WITH MINERALS) TABS tablet Take 1 tablet daily by mouth.    . nateglinide (STARLIX) 120 MG tablet Take 120 mg by mouth daily.     Marland Kitchen omeprazole (PRILOSEC) 40 MG capsule Take 40 mg by mouth 2 (two) times daily.    . OXYGEN Inhale 4 L/min into the lungs.    Marland Kitchen OZEMPIC, 0.25 OR 0.5 MG/DOSE, 2 MG/1.5ML SOPN INJECT 0.5 MG WEEKLY 4.5 mL 4  . Rivaroxaban  (XARELTO) 15 MG TABS tablet Take 15 mg by mouth daily.    Marland Kitchen spironolactone (ALDACTONE) 25 MG tablet Take 12.5 mg by mouth daily.    . Tiotropium Bromide-Olodaterol (STIOLTO RESPIMAT) 2.5-2.5 MCG/ACT AERS Inhale 2 puffs into the lungs daily. 4 g 5   No facility-administered medications prior to visit.   Allergies  Allergen Reactions  . Allopurinol Nausea And Vomiting  . Aspirin     Causes jitters   Objective:   Today's Vitals   08/27/20 1329  BP: 118/70  Pulse: 71  Temp: (!) 96.1 F (35.6 C)  TempSrc: Temporal  SpO2: (!) 89%  Weight: 158 lb 3.2 oz (71.8 kg)  Height: _0  (1.549 m)   Body mass index is 29.89 kg/m.   General: Well developed,  well nourished. No acute distress. Lungs: Clear to auscultation bilaterally. CV: RRR without murmurs or rubs. Pulses 2+ bilaterally. Extremities:1+ edema noted of the LLE, trace of RLE. Skin: Warm and dry. No rashes. Psych: Alert and oriented. Normal mood and affect.  Health Maintenance Due  Topic Date Due  . Hepatitis C Screening  Never done  . FOOT EXAM  Never done  . URINE MICROALBUMIN  Never done  . DEXA SCAN  Never done  . PNA vac Low Risk Adult (2 of 2 - PCV13) 06/20/2016  . HEMOGLOBIN A1C  01/09/2018     Assessment & Plan:   1. Pulmonary hypertension (HCC) Stable. Recent addition of Stiolto inhaler. Continue oxygen therapy.  2. Essential hypertension, benign BP in good control. Continue diltiazem.  - Basic metabolic panel  3. Controlled type 2 diabetes mellitus with stage 2 chronic kidney disease, without long-term current use of insulin ([place]) Due for HbA1c to monitor control. Currently on Tresiba insulin pen at 24 units daily, Starlix, and Ozempic.  - Hemoglobin A1c - Lipid panel  4. Hypothyroidism, unspecified type Due for monitoring of therapy.  - TSH  5. Chronic diastolic CHF (congestive heart failure) (HCC) On metoprolol, Lasix, and Aldactone. Weight is stable and lungs are clear. Will  monitor.   Haydee Salter, MD

## 2020-08-28 LAB — BASIC METABOLIC PANEL
BUN/Creatinine Ratio: 26 (calc) — ABNORMAL HIGH (ref 6–22)
BUN: 37 mg/dL — ABNORMAL HIGH (ref 7–25)
CO2: 30 mmol/L (ref 20–32)
Calcium: 9.4 mg/dL (ref 8.6–10.4)
Chloride: 100 mmol/L (ref 98–110)
Creat: 1.45 mg/dL — ABNORMAL HIGH (ref 0.60–0.93)
Glucose, Bld: 144 mg/dL — ABNORMAL HIGH (ref 65–99)
Potassium: 4.8 mmol/L (ref 3.5–5.3)
Sodium: 140 mmol/L (ref 135–146)

## 2020-08-28 LAB — TSH: TSH: 0.99 mIU/L (ref 0.40–4.50)

## 2020-08-28 LAB — HEMOGLOBIN A1C
Hgb A1c MFr Bld: 7.3 % of total Hgb — ABNORMAL HIGH (ref <5.7)
Mean Plasma Glucose: 163 mg/dL
eAG (mmol/L): 9 mmol/L

## 2020-08-28 LAB — LIPID PANEL
Cholesterol: 101 mg/dL (ref <200)
HDL: 43 mg/dL — ABNORMAL LOW (ref >=50)
LDL Cholesterol (Calc): 35 mg/dL (calc)
Non-HDL Cholesterol (Calc): 58 mg/dL (calc) (ref <130)
Total CHOL/HDL Ratio: 2.3 (calc) (ref <5.0)
Triglycerides: 157 mg/dL — ABNORMAL HIGH (ref <150)

## 2020-08-30 ENCOUNTER — Ambulatory Visit: Payer: Medicare PPO | Admitting: Physical Therapy

## 2020-08-31 ENCOUNTER — Encounter: Payer: Self-pay | Admitting: Family Medicine

## 2020-08-31 DIAGNOSIS — N182 Chronic kidney disease, stage 2 (mild): Secondary | ICD-10-CM

## 2020-08-31 DIAGNOSIS — E1122 Type 2 diabetes mellitus with diabetic chronic kidney disease: Secondary | ICD-10-CM

## 2020-08-31 MED ORDER — OZEMPIC (1 MG/DOSE) 2 MG/1.5ML ~~LOC~~ SOPN: 1.0000 mg | PEN_INJECTOR | SUBCUTANEOUS | 5 refills | Status: DC

## 2020-09-01 ENCOUNTER — Other Ambulatory Visit: Payer: Self-pay

## 2020-09-01 ENCOUNTER — Encounter: Payer: Self-pay | Admitting: Emergency Medicine

## 2020-09-01 ENCOUNTER — Ambulatory Visit: Payer: Medicare PPO | Admitting: Emergency Medicine

## 2020-09-01 VITALS — BP 128/72 | HR 65 | Temp 97.5°F | Ht 61.0 in | Wt 159.4 lb

## 2020-09-01 DIAGNOSIS — J449 Chronic obstructive pulmonary disease, unspecified: Secondary | ICD-10-CM | POA: Diagnosis not present

## 2020-09-01 DIAGNOSIS — J9611 Chronic respiratory failure with hypoxia: Secondary | ICD-10-CM | POA: Diagnosis not present

## 2020-09-01 DIAGNOSIS — R0683 Snoring: Secondary | ICD-10-CM

## 2020-09-01 DIAGNOSIS — I272 Pulmonary hypertension, unspecified: Secondary | ICD-10-CM

## 2020-09-01 NOTE — Patient Instructions (Addendum)
We will work on getting a split-night sleep study set up so that we can make progress with starting you back on CPAP treatment. Continue your oxygen, 4 L/min with exertion.  You likely need to wear oxygen at rest as well.  We want to keep your oxygen saturation greater than 90%.  Please use this based on your oximetry levels. We will continue Stiolto 2 puffs once daily. Agree with possible treatment for pulmonary hypertension going forward.  Dr. Lamonte Sakai will send notes to Dr. Haroldine Laws. Follow with Dr Lamonte Sakai in 3 months or sooner if you have any problems.

## 2020-09-01 NOTE — Progress Notes (Signed)
Subjective:    Patient ID: Amanda Short, female    DOB: [birth year], 80 y.o.   MRN: [id number]  HPI 80 year old former smoker (30 pack years) with a history of obstructive sleep apnea previously on on BiPAP/CPAP, COPD, remote thromboembolic disease with PE, systemic hypertension, associated severe secondary pulmonary hypertension, hypoxemic respiratory failure on 2 L/min, previously with tracheostomy (Decannulated).  Also with a history of breast cancer, diabetes mellitus.  Right heart cath 05/24/2020 PAP 92/36 (56), PVR 15, PAOP 11  Pulmonary function testing done 06/21/2020 reviewed by me, shows moderately severe obstruction, possible associated restriction.  She had difficulty performing the lung volume maneuver.  Decreased diffusion capacity that does not correct when adjusted for alveolar volume (pulmonary vascular)  VQ scan 8/21, normal CT abdomen 2/21 reviewed by me, showed a right lower lobe calcified granuloma.  A high-resolution CT chest is pending RF, Scl-70, ANCA all negative on 05/14/20  She tells me that she has exertional and resting dyspnea.  No cough, no sputum.  Occasionally hears wheezing Most recently she was on CPAP 8 cmH2O, stopped it about 4-5 months ago. Home PSG ordered and pending to establish OSA. She has used several masks, has trouble keeping in place. The tubing was cumbersome. Sometimes mask would come off in the night  ROV 09/01/20 --80 year old woman who follows up today for shortness of breath and multifactorial pulmonary hypertension.  She has a history of tobacco use with evidence for COPD on pulmonary function testing, also associated restrictive lung disease, OSA previously on BiPAP, chronic hypoxemic respiratory failure.  High-resolution CT scan of the chest 08/19/2020 reviewed by me, shows small calcified granulomata of unclear significance.  There are some mild diffuse bronchial wall thickening and centrilobular paraseptal emphysematous change, some areas  of mild cylindrical bronchiectasis without any evidence of groundglass septal thickening or honeycomb change.  There is some focal subpleural interstitial change present without overt ILD.  This may reflect early, evolving interstitial disease..  I tried Stiolto empirically at her last visit to see if you get benefit, she reports that it may may be helping a slight amount - eases some chest tightness especially in the morning. She is on 4L/min with activity, sometimes is on RA at rest.  She needs to be back on CPAP - hasn't been set up for PSG yet. Need to figure out why.  Considering targeted PAH therapy either macitentan or Tyvaso, following Dr. Haroldine Short.   Review of Systems As per HPI      Objective:   Physical Exam Vitals:   09/01/20 1552 09/01/20 1556  BP: 128/72   Pulse: 65   Temp: (!) 97.5 F (36.4 C)   TempSrc: Temporal   SpO2: 96% 96%  Weight: 159 lb 6.4 oz (72.3 kg)   Height: _0  (1.549 m)    Gen: Pleasant, overweight woman, in no distress,  normal affect  ENT: No lesions,  mouth clear,  oropharynx clear, no postnasal drip  Neck: No JVD, no stridor  Lungs: No use of accessory muscles, distant no crackles or wheezing on normal respiration, no wheeze on forced expiration  Cardiovascular: RRR, heart sounds normal with prominent S2, no murmur or gallops, no peripheral edema  Musculoskeletal: No deformities, no cyanosis or clubbing  Neuro: alert, awake, non focal  Skin: Warm, no lesions or rash      Assessment & Plan:  COPD (chronic obstructive pulmonary disease) (HCC) No profound response to the bronchodilator but she has had some benefit from the Stiolto.  I think that it has helped her enough to continue it and she agrees.  Chronic respiratory failure with hypoxia (HCC) Multifactorial chronic hypoxemic respiratory failure.  Reinforced with her that she needed to wear oxygen to keep her saturation greater than 90%.  She uses up to 4 L/min with exertion,  sometimes is on room air at rest.  Have asked her to confirm that she saturates adequately when she is on room air.  Pulmonary hypertension (HCC) Again multifactorial.  Probably at least in part at a proportion to her lung disease, OSA.  She is going to discuss with Dr. Haroldine Short possibly starting Tyvaso versus an alternative.  I do think she may benefit.  Treating her COPD, her hypoxemia has been important.  We also need to address her OSA if she is going to be optimally treated.  She has not had a PSG yet, not clear to me why.  I will try to obtain a split-night study.  Baltazar Apo, MD, PhD 09/01/2020, 5:37 PM  Pulmonary and Critical Care [phone] or if no answer [phone]

## 2020-09-01 NOTE — Assessment & Plan Note (Signed)
No profound response to the bronchodilator but she has had some benefit from the [facility].  I think that it has helped her enough to continue it and she agrees.

## 2020-09-01 NOTE — Assessment & Plan Note (Signed)
Multifactorial chronic hypoxemic respiratory failure.  Reinforced with her that she needed to wear oxygen to keep her saturation greater than 90%.  She uses up to 4 L/min with exertion, sometimes is on room air at rest.  Have asked her to confirm that she saturates adequately when she is on room air.

## 2020-09-01 NOTE — Assessment & Plan Note (Signed)
Again multifactorial.  Probably at least in part at a proportion to her lung disease, OSA.  She is going to discuss with Dr. Haroldine Laws possibly starting Tyvaso versus an alternative.  I do think she may benefit.  Treating her COPD, her hypoxemia has been important.  We also need to address her OSA if she is going to be optimally treated.  She has not had a PSG yet, not clear to me why.  I will try to obtain a split-night study.

## 2020-09-03 ENCOUNTER — Ambulatory Visit: Payer: Medicare PPO | Admitting: Physical Therapy

## 2020-09-07 ENCOUNTER — Ambulatory Visit: Payer: Medicare PPO | Admitting: Physical Therapy

## 2020-09-09 ENCOUNTER — Telehealth: Payer: Self-pay

## 2020-09-09 ENCOUNTER — Ambulatory Visit: Payer: Medicare PPO | Admitting: Physical Therapy

## 2020-09-09 ENCOUNTER — Telehealth (INDEPENDENT_AMBULATORY_CARE_PROVIDER_SITE_OTHER): Payer: Medicare PPO | Admitting: Family Medicine

## 2020-09-09 DIAGNOSIS — R0981 Nasal congestion: Secondary | ICD-10-CM

## 2020-09-09 DIAGNOSIS — R197 Diarrhea, unspecified: Secondary | ICD-10-CM | POA: Diagnosis not present

## 2020-09-09 DIAGNOSIS — R059 Cough, unspecified: Secondary | ICD-10-CM

## 2020-09-09 DIAGNOSIS — R52 Pain, unspecified: Secondary | ICD-10-CM | POA: Diagnosis not present

## 2020-09-09 MED ORDER — BENZONATATE 100 MG PO CAPS: 100.0000 mg | ORAL_CAPSULE | Freq: Three times a day (TID) | ORAL | 0 refills | Status: AC | PRN

## 2020-09-09 NOTE — Telephone Encounter (Signed)
Pt calling to inform provider that she has has diarrhea on and off, body aches and a cough x4 days.  Pt thinks she has a sinus infection, headache behind eyes, runny, congestion in nose.  Pt would like a call to inform her what she can do about these symptoms.  I offered pt a virtual visit with Dr. Maudie Mercury at [facility].

## 2020-09-09 NOTE — Progress Notes (Signed)
Virtual Visit via Telephone Note  I connected with Amanda Short on 09/09/20 at  3:20 PM EST by telephone and verified that I am speaking with the correct person using two identifiers.   I discussed the limitations, risks, security and privacy concerns of performing an evaluation and management service by telephone and the availability of in person appointments. I also discussed with the patient that there may be a patient responsible charge related to this service. The patient expressed understanding and agreed to proceed.  Location patient: home, [place] Location provider: work or home office Participants present for the call: patient, provider Patient did not have a visit with me in the prior 7 days to address this/these issue(s).   History of Present Illness:  Acute telemedicine visit for : -Onset: 09/04/20 -Symptoms include: diarrhea on and off, achy, nasal congestion, mild headache, cough -Denies: fever, vomiting, CP, SOB changed from baseline, inability to eat/drink/get out of bed, known sick contacts -Pertinent past medical history:HTN, pulm HTN, COPD, OSA, hx DVT/PE, CHF, DM, hypothyroid, seizure, hx of cancer, HLD -BS have been good - around 94 fasting -Pertinent medication allergies: Amanda, allopurinol -COVID-19 vaccine status: 3 doses of covid shot; had flu shot as well   Observations/Objective: Patient sounds cheerful and well on the phone. I do not appreciate any SOB. Speech and thought processing are grossly intact. Patient reported vitals:  Assessment and Plan:  Diarrhea, unspecified type  Nasal congestion  Cough  Body aches  -we discussed possible serious and likely etiologies, options for evaluation and workup, limitations of telemedicine visit vs in person visit, treatment, treatment risks and precautions. Pt prefers to treat via telemedicine empirically rather than in person at this moment.  Suspect viral illness, possible COVID-19, possible influenza versus  other.  She is out of the joint treatment window for likely benefit from Tamiflu, so opted against flu testing or treatment.  Did advise Covid testing and discussed options for testing, treatment options, potential complications and precautions.  Advised if she has a positive test to schedule follow-up video visit.  In the interim opted for treatment of the cough with Tessalon, nasal saline for the nasal congestion and Imodium if needed for the diarrhea.  She does report the diarrhea seems to have resolved.  Advised a follow-up visit with her primary care doctor next week to check in.  She agrees to schedule this. Advised to seek prompt in person care if worsening, new symptoms arise, or if is not improving with treatment. Advised of options for inperson care in case PCP office not available. Did let the patient know that I only do telemedicine shifts for [place] on Tuesdays and Thursdays and advised a follow up visit with PCP or at an [facility] if has further questions or concerns.   Follow Up Instructions:  I did not refer this patient for an OV with me in the next 24 hours for this/these issue(s).  I discussed the assessment and treatment plan with the patient. The patient was provided an opportunity to ask questions and all were answered. The patient agreed with the plan and demonstrated an understanding of the instructions.   I spent 15 minutes on the date of this visit in the care of this patient. See summary of tasks completed to properly care for this patient in the detailed notes above which also included counseling of above, review of PMH, medications, allergies, evaluation of the patient and ordering and/or  instructing patient on testing and care options.  Lucretia Kern, DO

## 2020-09-09 NOTE — Patient Instructions (Addendum)
  HOME CARE TIPS:  -[place] testing information: https://www.[facility].org/ OR [phone] Most pharmacies also offer testing and home test kits. If your Covid test is positive, please make a prompt follow-up with your primary care office or prompt video visit follow-up through [facility] health.  -I sent the medication(s) we discussed to your pharmacy: Meds ordered this encounter  Medications  . benzonatate (TESSALON PERLES) 100 MG capsule    Sig: Take 1 capsule (100 mg total) by mouth 3 (three) times daily as needed.    Dispense:  20 capsule    Refill:  0     -can use tylenol if needed per instructions for pain or fevers.  -If any further diarrhea, can use Imodium per instructions on the package.  -can use nasal saline a few times per day if you have nasal congestion  -stay hydrated, eat small healthy meals - avoid dairy  -follow up with your doctor in 2-3 days unless improving and feeling better  -stay home while sick, except to seek medical care, and if you have COVID19 ideally it would be best to stay home for a full 10 days since the onset of symptoms PLUS one day of no fever and feeling better. Wear a good mask (such as N95 or KN95) if around others to reduce the risk of transmission.  It was nice to meet you today, and I really hope you are feeling better soon. I help [facility] out with telemedicine visits on Tuesdays and Thursdays and am available for visits on those days. If you have any concerns or questions following this visit please schedule a follow up visit with your Primary Care doctor or seek care at a local urgent care clinic to avoid delays in care.    Seek in person care or schedule a follow up video visit promptly if your symptoms worsen, new concerns arise or you are not improving with treatment. Call 911 and/or seek emergency care if your symptoms are severe or life threatening.

## 2020-09-14 ENCOUNTER — Ambulatory Visit: Payer: Medicare PPO | Attending: Family Medicine | Admitting: Physical Therapy

## 2020-09-14 ENCOUNTER — Encounter: Payer: Self-pay | Admitting: Physical Therapy

## 2020-09-14 ENCOUNTER — Other Ambulatory Visit: Payer: Self-pay

## 2020-09-14 DIAGNOSIS — M545 Low back pain, unspecified: Secondary | ICD-10-CM | POA: Diagnosis present

## 2020-09-14 DIAGNOSIS — M6281 Muscle weakness (generalized): Secondary | ICD-10-CM

## 2020-09-14 DIAGNOSIS — M62830 Muscle spasm of back: Secondary | ICD-10-CM | POA: Diagnosis present

## 2020-09-14 DIAGNOSIS — Z9181 History of falling: Secondary | ICD-10-CM | POA: Diagnosis present

## 2020-09-14 DIAGNOSIS — R2689 Other abnormalities of gait and mobility: Secondary | ICD-10-CM | POA: Insufficient documentation

## 2020-09-14 DIAGNOSIS — R5381 Other malaise: Secondary | ICD-10-CM

## 2020-09-14 DIAGNOSIS — R262 Difficulty in walking, not elsewhere classified: Secondary | ICD-10-CM

## 2020-09-14 NOTE — Therapy (Signed)
[place]. [place], [name], [address] Phone: [phone]   Fax:  [phone]  Physical Therapy Treatment  Patient Details  Name: Amanda Short MRN: [id number] Date of Birth: 10-07-1940 Referring Provider (PT): Rennard   Encounter Date: 09/14/2020   PT End of Session - 09/14/20 1351    Visit Number 80    Number of Visits 61    Date for PT Re-Evaluation 10/14/20    Authorization Type [employer]    PT Start Time 1300    PT Stop Time 1400    PT Time Calculation (min) 60 min    Activity Tolerance Patient limited by fatigue;Patient limited by pain    Behavior During Therapy [facility] for tasks assessed/performed           Past Medical History:  Diagnosis Date  . Abdominal pain   . Abnormal CT of the abdomen   . Acute blood loss anemia   . Acute cystitis with hematuria 12/19/2019  . Acute encephalopathy 06/06/2017  . Acute pulmonary embolism ([place]) 04/01/2013  . Acute respiratory failure with hypoxia ([place])   . Anxiety state   . Arthritis    "back, hands" (07/22/2015)  . Aspiration pneumonia ([place])   . Benign essential HTN   . Cancer of left breast ([place]) 2006   S/P lumpectomy  . Cellulitis of foot, right 07/21/2015  . Cellulitis of right foot 07/21/2015  . Central retinal vein occlusion of right eye 04/20/2016  . Chronic diastolic CHF (congestive heart failure) ([place]) 07/10/2017  . Colitis 05/27/2017  . Complication of anesthesia    "brief breathing problem at surgery center in ~ 2005 when I had gallbladder OR"  . Concussion 03/2013   due to fall  . Depression   . Diabetes ([place]) 04/01/2013  . 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 Diabetes mellitus type 2, controlled ([place]) 07/21/2015  . Diabetic foot infection ([place]) 07/21/2015  . Diverticulitis of colon   . Diverticulitis of large intestine without perforation or abscess without bleeding   . Diverticulosis   . DVT (deep venous thrombosis) ([place]) 03/2013   LLE  .  Encephalopathy   . Encounter for attention to tracheostomy ([place])   . Encounter for central line placement   . Encounter for orogastric (OG) tube placement   . Essential hypertension, benign 04/01/2013  . GERD (gastroesophageal reflux disease)   . History of acute respiratory failure   . History of pulmonary embolism   . HX: breast cancer 04/01/2013  . Hyperlipidemia   . Hypertension   . Hypocalcemia 05/27/2017  . Hypokalemia 07/10/2017  . Hypomagnesemia 07/11/2017  . Hypothyroidism   . Hypoxia 03/23/2020  . Nausea vomiting and diarrhea 07/10/2017  . Non-intractable cyclical vomiting with nausea   . Nuclear sclerotic cataract of both eyes 04/20/2016  . OSA (obstructive sleep apnea) 07/10/2017  . OSA on CPAP 04/01/2013  . OSA treated with BiPAP   . Physical debility 06/23/2017  . 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 Pulmonary embolism ([place]) 03/2013  . Pulmonary HTN ([place]) 07/05/2017  . Seizure ([place]) 07/10/2017  . Seizure disorder ([place]) 07/05/2017  . Seizures ([place])   . Sepsis ([place]) 07/10/2017  . Sleep apnea   . Status epilepticus ([place])   . 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 Tachypnea   . Thyroid disease    Hypothyroid  . Tracheostomy, acute management ([place])   . Type II diabetes mellitus ([place])     Past Surgical History:  Procedure Laterality Date  . ABDOMINAL HYSTERECTOMY  1970s  . BREAST BIOPSY Left 2006  . BREAST LUMPECTOMY Left 2006  . BUNIONECTOMY WITH HAMMERTOE RECONSTRUCTION Left   . CARDIAC CATHETERIZATION  06/2020  . ESOPHAGOGASTRODUODENOSCOPY N/A 05/29/2017   Procedure: ESOPHAGOGASTRODUODENOSCOPY (EGD);  Surgeon: Irene Shipper, MD;  Location: [facility];  Service: Endoscopy;  Laterality: N/A;  . JOINT REPLACEMENT    . LAPAROSCOPIC CHOLECYSTECTOMY  ~ 2005  . Pulmonary function test  2-3 weeks ago  . RIGHT/LEFT HEART CATH AND CORONARY ANGIOGRAPHY N/A 05/24/2020   Procedure: RIGHT/LEFT HEART CATH AND CORONARY ANGIOGRAPHY;  Surgeon: Jolaine Artist, MD;  Location:  [facility];  Service: Cardiovascular;  Laterality: N/A;  . TOTAL HIP ARTHROPLASTY Left 2010  . TOTAL KNEE ARTHROPLASTY Bilateral 2005-2006    There were no vitals filed for this visit.   Subjective Assessment - 09/14/20 1305    Subjective Patient has not been in to see PT in about a month, she reports that she developed issues with a possible virus and was wiped out, she reports feeling very weak and had about a week that she "really could not get out of bed", also suffered from leg cramps, she has been advised by MD to have O2 on all the time at 4L.  O2 saturation prior to starting was 92%    Currently in Pain? Yes    Pain Score 7     Pain Location Back    Pain Orientation Left    Pain Descriptors / Indicators Sore;Aching    Pain Onset 1 to 4 weeks ago    Pain Frequency Constant    Aggravating Factors  reports that her back has hurt since she fell on January 24th              [facility] PT Assessment - 09/14/20 0001      Strength   Overall Strength Comments hips 4-/5, knees 4-/5, ankles 4/5      Palpation   Palpation comment tender in the left lumbar area and into the left buttock      Timed Up and Go Test   Normal TUG (seconds) 25    TUG Comments not very stable , almost like a toddler, wide BOS, very stiff LE's                         [facility] Adult PT Treatment/Exercise - 09/14/20 0001      Ambulation/Gait   Gait Comments no device, mostly HHA, slow, WBOS, very unsteady, 2x 70'      Lumbar Exercises: Aerobic   Nustep level 4 x 6 minutes      Lumbar Exercises: Seated   Long Arc Quad on Chair Both;2 sets;10 reps    LAQ on Chair Weights (lbs) 3#    Other Seated Lumbar Exercises marching 3# 2x10 each, 2# bicep curls    Other Seated Lumbar Exercises red tband knee flexion 2x10      Moist Heat Therapy   Number Minutes Moist Heat 12 Minutes    Moist Heat Location Lumbar Spine      Electrical Stimulation   Electrical Stimulation Location lumbar area     Electrical Stimulation Action IFC    Electrical Stimulation Parameters sitting    Electrical Stimulation Goals Pain                    PT Short Term Goals - 12/04/19 1627  PT SHORT TERM GOAL #1   Title independent with intial HEP    Status Achieved             PT Long Term Goals - 09/14/20 1401      PT LONG TERM GOAL #1   Title increase overall hip LE strength for functional gait and safety hips and ankles to 4+/5    Status On-going      PT LONG TERM GOAL #2   Title decrease TUG time to less than 14 seconds for functional gait and safety    Status On-going                 Plan - 09/14/20 1352    Clinical Impression Statement Patient has not been in to PT in over a month, she had a fall prior to that last visit and was not feeling well, she report sthat she got some sort of virus and was in bed, a few days could not get out of bed, had significant leg cramps.  She is now on 4L O2.  She reports a second fall about 2 weeks ago but not all the way down to the ground.  Her husband reports that she really could not do much at all during the past few weeks.  She comes back and is much more slow TUG 28 seconds, and is off balance, walks like a toddler, slow, WBOS and teetering side to side.    PT Next Visit Plan Patient has been out for over a month, got sick, doing worse, slower walking and more unsteady    Consulted and Agree with Plan of Care Patient           Patient will benefit from skilled therapeutic intervention in order to improve the following deficits and impairments:  Abnormal gait,Decreased range of motion,Difficulty walking,Increased muscle spasms,Decreased activity tolerance,Pain,Decreased balance,Impaired flexibility,Improper body mechanics,Postural dysfunction,Decreased strength,Decreased mobility  Visit Diagnosis: Muscle weakness (generalized)  Risk for falls  Difficulty in walking, not elsewhere classified  Physical debility  Acute  bilateral low back pain without sciatica  Muscle spasm of back  Other abnormalities of gait and mobility     Problem List Patient Active Problem List   Diagnosis Date Noted  . COPD (chronic obstructive pulmonary disease) ([place]) 07/26/2020  . Pulmonary nodule 07/26/2020  . Abnormal CT of the abdomen   . Thyroid disease   . Seizures ([place])   . Hyperlipidemia   . GERD (gastroesophageal reflux disease)   . Diverticulosis   . Depression   . Complication of anesthesia   . Arthritis   . Hypoxia 03/23/2020  . Acute cystitis with hematuria 12/19/2019  . Hypomagnesemia 07/11/2017  . Nausea vomiting and diarrhea 07/10/2017  . Hypokalemia 07/10/2017  . Chronic diastolic CHF (congestive heart failure) ([place]) 07/10/2017  . Seizure ([place]) 07/10/2017  . OSA (obstructive sleep apnea) 07/10/2017  . Sepsis ([place]) 07/10/2017  . Seizure disorder ([place]) 07/05/2017  . Pulmonary hypertension ([place]) 07/05/2017  . Anxiety state   . Physical debility 06/23/2017  . 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 History of acute respiratory failure   . Tracheostomy, acute management ([place])   . Status epilepticus ([place])   . Benign essential HTN   . Acute blood loss anemia   . Diverticulitis of colon   . Tachypnea   . Encounter for orogastric (OG) tube placement   . Encounter for central line placement   . Encounter for attention to tracheostomy ([place])   . Chronic respiratory failure  with hypoxia ([place])   . Acute encephalopathy 06/06/2017  . Diverticulitis of large intestine without perforation or abscess without bleeding   . Abdominal pain   . Non-intractable cyclical vomiting with nausea   . Hypocalcemia 05/27/2017  . 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 Colitis 05/27/2017  . Central retinal vein occlusion of right eye 04/20/2016  . Nuclear sclerotic cataract of both eyes 04/20/2016  . Diabetic foot infection ([place]) 07/21/2015  . Diabetes mellitus type 2, controlled ([place]) 07/21/2015  . Cellulitis of foot,  right 07/21/2015  . Acute pulmonary embolism ([place]) 04/01/2013  . DVT (deep venous thrombosis) ([place]) 04/01/2013  . HX: breast cancer 04/01/2013  . Concussion 04/01/2013  . Essential hypertension, benign 04/01/2013  . Hypothyroidism 04/01/2013  . Cancer of left breast [facility]) 2006    [name] Boast., PT 09/14/2020, 2:01 PM  [address] [place]. [place], [name], [address] Phone: [phone]   Fax:  [phone]  Name: Neosha Switalski MRN: [id number] Date of Birth: 23-Sep-1940

## 2020-09-16 ENCOUNTER — Ambulatory Visit: Payer: Medicare PPO | Admitting: Physical Therapy

## 2020-09-16 ENCOUNTER — Other Ambulatory Visit: Payer: Self-pay

## 2020-09-16 ENCOUNTER — Encounter: Payer: Self-pay | Admitting: Physical Therapy

## 2020-09-16 DIAGNOSIS — M6281 Muscle weakness (generalized): Secondary | ICD-10-CM | POA: Diagnosis not present

## 2020-09-16 DIAGNOSIS — R5381 Other malaise: Secondary | ICD-10-CM

## 2020-09-16 DIAGNOSIS — R2689 Other abnormalities of gait and mobility: Secondary | ICD-10-CM

## 2020-09-16 DIAGNOSIS — M545 Low back pain, unspecified: Secondary | ICD-10-CM

## 2020-09-16 DIAGNOSIS — Z9181 History of falling: Secondary | ICD-10-CM

## 2020-09-16 DIAGNOSIS — R262 Difficulty in walking, not elsewhere classified: Secondary | ICD-10-CM

## 2020-09-16 DIAGNOSIS — M62830 Muscle spasm of back: Secondary | ICD-10-CM

## 2020-09-16 NOTE — Therapy (Signed)
[place]. [place], [name], [address] Phone: [phone]   Fax:  [phone]  Physical Therapy Treatment  Patient Details  Name: Amanda Short MRN: [id number] Date of Birth: 01/14/1941 Referring Provider (PT): Rennard   Encounter Date: 09/16/2020   PT End of Session - 09/16/20 1406    Visit Number 69    Number of Visits 24    Date for PT Re-Evaluation 10/14/20    Authorization Type [employer]    PT Start Time 1308    PT Stop Time 1351    PT Time Calculation (min) 43 min    Activity Tolerance Patient tolerated treatment well    Behavior During Therapy [facility] for tasks assessed/performed           Past Medical History:  Diagnosis Date  . Abdominal pain   . Abnormal CT of the abdomen   . Acute blood loss anemia   . Acute cystitis with hematuria 12/19/2019  . Acute encephalopathy 06/06/2017  . Acute pulmonary embolism ([place]) 04/01/2013  . Acute respiratory failure with hypoxia ([place])   . Anxiety state   . Arthritis    "back, hands" (07/22/2015)  . Aspiration pneumonia ([place])   . Benign essential HTN   . Cancer of left breast ([place]) 2006   S/P lumpectomy  . Cellulitis of foot, right 07/21/2015  . Cellulitis of right foot 07/21/2015  . Central retinal vein occlusion of right eye 04/20/2016  . Chronic diastolic CHF (congestive heart failure) ([place]) 07/10/2017  . Colitis 05/27/2017  . Complication of anesthesia    "brief breathing problem at surgery center in ~ 2005 when I had gallbladder OR"  . Concussion 03/2013   due to fall  . Depression   . Diabetes ([place]) 04/01/2013  . 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 Diabetes mellitus type 2, controlled ([place]) 07/21/2015  . Diabetic foot infection ([place]) 07/21/2015  . Diverticulitis of colon   . Diverticulitis of large intestine without perforation or abscess without bleeding   . Diverticulosis   . DVT (deep venous thrombosis) ([place]) 03/2013   LLE  . Encephalopathy   .  Encounter for attention to tracheostomy ([place])   . Encounter for central line placement   . Encounter for orogastric (OG) tube placement   . Essential hypertension, benign 04/01/2013  . GERD (gastroesophageal reflux disease)   . History of acute respiratory failure   . History of pulmonary embolism   . HX: breast cancer 04/01/2013  . Hyperlipidemia   . Hypertension   . Hypocalcemia 05/27/2017  . Hypokalemia 07/10/2017  . Hypomagnesemia 07/11/2017  . Hypothyroidism   . Hypoxia 03/23/2020  . Nausea vomiting and diarrhea 07/10/2017  . Non-intractable cyclical vomiting with nausea   . Nuclear sclerotic cataract of both eyes 04/20/2016  . OSA (obstructive sleep apnea) 07/10/2017  . OSA on CPAP 04/01/2013  . OSA treated with BiPAP   . Physical debility 06/23/2017  . 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 Pulmonary embolism ([place]) 03/2013  . Pulmonary HTN ([place]) 07/05/2017  . Seizure ([place]) 07/10/2017  . Seizure disorder ([place]) 07/05/2017  . Seizures ([place])   . Sepsis ([place]) 07/10/2017  . Sleep apnea   . Status epilepticus ([place])   . 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 Tachypnea   . Thyroid disease    Hypothyroid  . Tracheostomy, acute management ([place])   . Type II diabetes mellitus ([place])     Past Surgical History:  Procedure Laterality  Date  . ABDOMINAL HYSTERECTOMY  1970s  . BREAST BIOPSY Left 2006  . BREAST LUMPECTOMY Left 2006  . BUNIONECTOMY WITH HAMMERTOE RECONSTRUCTION Left   . CARDIAC CATHETERIZATION  06/2020  . ESOPHAGOGASTRODUODENOSCOPY N/A 05/29/2017   Procedure: ESOPHAGOGASTRODUODENOSCOPY (EGD);  Surgeon: Irene Shipper, MD;  Location: [facility];  Service: Endoscopy;  Laterality: N/A;  . JOINT REPLACEMENT    . LAPAROSCOPIC CHOLECYSTECTOMY  ~ 2005  . Pulmonary function test  2-3 weeks ago  . RIGHT/LEFT HEART CATH AND CORONARY ANGIOGRAPHY N/A 05/24/2020   Procedure: RIGHT/LEFT HEART CATH AND CORONARY ANGIOGRAPHY;  Surgeon: Jolaine Artist, MD;  Location: [place];   Service: Cardiovascular;  Laterality: N/A;  . TOTAL HIP ARTHROPLASTY Left 2010  . TOTAL KNEE ARTHROPLASTY Bilateral 2005-2006    There were no vitals filed for this visit.   Subjective Assessment - 09/16/20 1309    Subjective REports that the pain is a little better, pain is now more on the right low back    Currently in Pain? Yes    Pain Score 4     Pain Location Back    Pain Orientation Right    Pain Relieving Factors the trreatment helped                             [facility] Adult PT Treatment/Exercise - 09/16/20 0001      Ambulation/Gait   Gait Comments up and down steps, step over step both handrails, this really taxes her O2 dropped from 100 % to 90%      High Level Balance   High Level Balance Activities Side stepping    High Level Balance Comments on airex balance beam in the parallel bars side steppping on the balance beam, on airex red tband two ways, and 6" toe touches      Lumbar Exercises: Stretches   Gastroc Stretch Right;Left;3 reps;20 seconds      Lumbar Exercises: Aerobic   Nustep level 5 x 500 steps took over 9 minutes      Lumbar Exercises: Standing   Other Standing Lumbar Exercises 3# marching, extension and hip abduction and HS curls with walker 2x10       Lumbar Exercises: Seated   Long Arc Quad on Chair Both;2 sets;10 reps    LAQ on Chair Weights (lbs) 4#                    PT Short Term Goals - 12/04/19 1627      PT SHORT TERM GOAL #1   Title independent with intial HEP    Status Achieved             PT Long Term Goals - 09/14/20 1401      PT LONG TERM GOAL #1   Title increase overall hip LE strength for functional gait and safety hips and ankles to 4+/5    Status On-going      PT LONG TERM GOAL #2   Title decrease TUG time to less than 14 seconds for functional gait and safety    Status On-going                 Plan - 09/16/20 1406    Clinical Impression Statement Patient had O2 saturation at  100% with doing 2 up and 3 down stairs the O2 saturation dropped to 90% and she was wheezing some and very fatigued, this is the highest I have  seen the O2 saturation.  After her being sick the past month and for one week not being able to get out of bed, she has definitely regressed with her abilities but the oxygen is staying where it needs to be, the MD wants it above 88%.  She is much more unsteady and needs close CGA due to this    PT Next Visit Plan Patient has been out for over a month, got sick, doing worse, slower walking and more unsteady    Consulted and Agree with Plan of Care Patient           Patient will benefit from skilled therapeutic intervention in order to improve the following deficits and impairments:  Abnormal gait,Decreased range of motion,Difficulty walking,Increased muscle spasms,Decreased activity tolerance,Pain,Decreased balance,Impaired flexibility,Improper body mechanics,Postural dysfunction,Decreased strength,Decreased mobility  Visit Diagnosis: Muscle weakness (generalized)  Risk for falls  Difficulty in walking, not elsewhere classified  Physical debility  Acute bilateral low back pain without sciatica  Muscle spasm of back  Other abnormalities of gait and mobility     Problem List Patient Active Problem List   Diagnosis Date Noted  . COPD (chronic obstructive pulmonary disease) ([place]) 07/26/2020  . Pulmonary nodule 07/26/2020  . Abnormal CT of the abdomen   . Thyroid disease   . Seizures ([place])   . Hyperlipidemia   . GERD (gastroesophageal reflux disease)   . Diverticulosis   . Depression   . Complication of anesthesia   . Arthritis   . Hypoxia 03/23/2020  . Acute cystitis with hematuria 12/19/2019  . Hypomagnesemia 07/11/2017  . Nausea vomiting and diarrhea 07/10/2017  . Hypokalemia 07/10/2017  . Chronic diastolic CHF (congestive heart failure) ([place]) 07/10/2017  . Seizure ([place]) 07/10/2017  . OSA (obstructive sleep apnea) 07/10/2017  .  Sepsis ([place]) 07/10/2017  . Seizure disorder ([place]) 07/05/2017  . Pulmonary hypertension ([place]) 07/05/2017  . Anxiety state   . Physical debility 06/23/2017  . 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 History of acute respiratory failure   . Tracheostomy, acute management ([place])   . Status epilepticus ([place])   . Benign essential HTN   . Acute blood loss anemia   . Diverticulitis of colon   . Tachypnea   . Encounter for orogastric (OG) tube placement   . Encounter for central line placement   . Encounter for attention to tracheostomy ([place])   . Chronic respiratory failure with hypoxia ([place])   . Acute encephalopathy 06/06/2017  . Diverticulitis of large intestine without perforation or abscess without bleeding   . Abdominal pain   . Non-intractable cyclical vomiting with nausea   . Hypocalcemia 05/27/2017  . 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 Colitis 05/27/2017  . Central retinal vein occlusion of right eye 04/20/2016  . Nuclear sclerotic cataract of both eyes 04/20/2016  . Diabetic foot infection ([place]) 07/21/2015  . Diabetes mellitus type 2, controlled ([place]) 07/21/2015  . Cellulitis of foot, right 07/21/2015  . Acute pulmonary embolism ([place]) 04/01/2013  . DVT (deep venous thrombosis) ([place]) 04/01/2013  . HX: breast cancer 04/01/2013  . Concussion 04/01/2013  . Essential hypertension, benign 04/01/2013  . Hypothyroidism 04/01/2013  . Cancer of left breast [facility]) 2006    [name] Boast., PT 09/16/2020, 3:12 PM  [place]. [place], [name], [address] Phone: [phone]   Fax:  [phone]  Name: Scotty Pinder MRN: [id number] Date of Birth: February 12, 1941

## 2020-09-19 ENCOUNTER — Other Ambulatory Visit: Payer: Self-pay | Admitting: Family Medicine

## 2020-09-19 DIAGNOSIS — G4700 Insomnia, unspecified: Secondary | ICD-10-CM

## 2020-09-20 ENCOUNTER — Inpatient Hospital Stay: Admission: RE | Admit: 2020-09-20 | Payer: Medicare PPO | Source: Ambulatory Visit

## 2020-09-20 ENCOUNTER — Other Ambulatory Visit: Payer: Medicare PPO

## 2020-09-21 ENCOUNTER — Ambulatory Visit: Payer: Medicare PPO | Admitting: Physical Therapy

## 2020-09-21 ENCOUNTER — Telehealth: Payer: Self-pay | Admitting: Family Medicine

## 2020-09-21 DIAGNOSIS — E039 Hypothyroidism, unspecified: Secondary | ICD-10-CM

## 2020-09-21 DIAGNOSIS — M1990 Unspecified osteoarthritis, unspecified site: Secondary | ICD-10-CM

## 2020-09-21 MED ORDER — DULOXETINE HCL 60 MG PO CPEP: 60.0000 mg | ORAL_CAPSULE | Freq: Every day | ORAL | 3 refills | Status: DC

## 2020-09-21 MED ORDER — LEVOTHYROXINE SODIUM 100 MCG PO TABS: 100.0000 ug | ORAL_TABLET | Freq: Every day | ORAL | 3 refills | Status: DC

## 2020-09-21 NOTE — Telephone Encounter (Signed)
Pt called and said she needs a refill on  1. DULoxetine (CYMBALTA) 60 MG capsule 2. levothyroxine (SYNTHROID, LEVOTHROID) 100 MCG tablet She said the pharmacy would not fill because it had her previous provider on it

## 2020-09-21 NOTE — Telephone Encounter (Signed)
Please review and advise.   Thanks.  Dm/cma

## 2020-09-22 ENCOUNTER — Other Ambulatory Visit: Payer: Medicare PPO

## 2020-09-22 NOTE — Telephone Encounter (Signed)
lft VM that RX was sent to her pharmacy and if any problems to call us back. Dm/cma

## 2020-09-29 ENCOUNTER — Other Ambulatory Visit: Payer: Self-pay

## 2020-09-29 ENCOUNTER — Encounter: Payer: Self-pay | Admitting: Physical Therapy

## 2020-09-29 ENCOUNTER — Ambulatory Visit: Payer: Medicare PPO | Admitting: Physical Therapy

## 2020-09-29 DIAGNOSIS — R262 Difficulty in walking, not elsewhere classified: Secondary | ICD-10-CM

## 2020-09-29 DIAGNOSIS — Z9181 History of falling: Secondary | ICD-10-CM

## 2020-09-29 DIAGNOSIS — R5381 Other malaise: Secondary | ICD-10-CM

## 2020-09-29 DIAGNOSIS — M6281 Muscle weakness (generalized): Secondary | ICD-10-CM

## 2020-09-29 DIAGNOSIS — M62830 Muscle spasm of back: Secondary | ICD-10-CM

## 2020-09-29 DIAGNOSIS — M545 Low back pain, unspecified: Secondary | ICD-10-CM

## 2020-09-29 NOTE — Therapy (Signed)
[place]. [place], [name], [address] Phone: [phone]   Fax:  [phone]  Physical Therapy Treatment  Patient Details  Name: Amanda Short MRN: [id number] Date of Birth: [birth year] Referring Provider (PT): Rennard   Encounter Date: 09/29/2020   PT End of Session - 09/29/20 6440    Visit Number 97    Number of Visits 50    Date for PT Re-Evaluation 10/14/20    Authorization Type [employer]    PT Start Time 1522    PT Stop Time 1604    PT Time Calculation (min) 42 min    Activity Tolerance Patient tolerated treatment well;Patient limited by fatigue    Behavior During Therapy [facility] for tasks assessed/performed           Past Medical History:  Diagnosis Date  . Abdominal pain   . Abnormal CT of the abdomen   . Acute blood loss anemia   . Acute cystitis with hematuria 12/19/2019  . Acute encephalopathy 06/06/2017  . Acute pulmonary embolism ([place]) 04/01/2013  . Acute respiratory failure with hypoxia ([place])   . Anxiety state   . Arthritis    "back, hands" (07/22/2015)  . Aspiration pneumonia ([place])   . Benign essential HTN   . Cancer of left breast ([place]) 2006   S/P lumpectomy  . Cellulitis of foot, right 07/21/2015  . Cellulitis of right foot 07/21/2015  . Central retinal vein occlusion of right eye 04/20/2016  . Chronic diastolic CHF (congestive heart failure) ([place]) 07/10/2017  . Colitis 05/27/2017  . Complication of anesthesia    "brief breathing problem at surgery center in ~ 2005 when I had gallbladder OR"  . Concussion 03/2013   due to fall  . Depression   . Diabetes ([place]) 04/01/2013  . 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 Diabetes mellitus type 2, controlled ([place]) 07/21/2015  . Diabetic foot infection ([place]) 07/21/2015  . Diverticulitis of colon   . Diverticulitis of large intestine without perforation or abscess without bleeding   . Diverticulosis   . DVT (deep venous thrombosis) ([place]) 03/2013    LLE  . Encephalopathy   . Encounter for attention to tracheostomy ([place])   . Encounter for central line placement   . Encounter for orogastric (OG) tube placement   . Essential hypertension, benign 04/01/2013  . GERD (gastroesophageal reflux disease)   . History of acute respiratory failure   . History of pulmonary embolism   . HX: breast cancer 04/01/2013  . Hyperlipidemia   . Hypertension   . Hypocalcemia 05/27/2017  . Hypokalemia 07/10/2017  . Hypomagnesemia 07/11/2017  . Hypothyroidism   . Hypoxia 03/23/2020  . Nausea vomiting and diarrhea 07/10/2017  . Non-intractable cyclical vomiting with nausea   . Nuclear sclerotic cataract of both eyes 04/20/2016  . OSA (obstructive sleep apnea) 07/10/2017  . OSA on CPAP 04/01/2013  . OSA treated with BiPAP   . Physical debility 06/23/2017  . 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 Pulmonary embolism ([place]) 03/2013  . Pulmonary HTN ([place]) 07/05/2017  . Seizure ([place]) 07/10/2017  . Seizure disorder ([place]) 07/05/2017  . Seizures ([place])   . Sepsis ([place]) 07/10/2017  . Sleep apnea   . Status epilepticus ([place])   . 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 Tachypnea   . Thyroid disease    Hypothyroid  . Tracheostomy, acute management ([place])   . Type II diabetes mellitus ([place])     Past Surgical History:  Procedure Laterality Date  . ABDOMINAL HYSTERECTOMY  1970s  . BREAST BIOPSY Left 2006  . BREAST LUMPECTOMY Left 2006  . BUNIONECTOMY WITH HAMMERTOE RECONSTRUCTION Left   . CARDIAC CATHETERIZATION  06/2020  . ESOPHAGOGASTRODUODENOSCOPY N/A 05/29/2017   Procedure: ESOPHAGOGASTRODUODENOSCOPY (EGD);  Surgeon: Irene Shipper, MD;  Location: [facility];  Service: Endoscopy;  Laterality: N/A;  . JOINT REPLACEMENT    . LAPAROSCOPIC CHOLECYSTECTOMY  ~ 2005  . Pulmonary function test  2-3 weeks ago  . RIGHT/LEFT HEART CATH AND CORONARY ANGIOGRAPHY N/A 05/24/2020   Procedure: RIGHT/LEFT HEART CATH AND CORONARY ANGIOGRAPHY;  Surgeon: Jolaine Artist, MD;   Location: [facility];  Service: Cardiovascular;  Laterality: N/A;  . TOTAL HIP ARTHROPLASTY Left 2010  . TOTAL KNEE ARTHROPLASTY Bilateral 2005-2006    There were no vitals filed for this visit.   Subjective Assessment - 09/29/20 1521    Subjective Patient reports a fall last week, she was bending over and fell forward.  She had some bruises.  She reports that her back is hurting more again.  She and her husband report not doing well since the fall    Currently in Pain? Yes    Pain Score 3     Pain Location Back    Pain Descriptors / Indicators Aching;Sore    Aggravating Factors  falling              [facility] PT Assessment - 09/29/20 0001      Timed Up and Go Test   Normal TUG (seconds) 22    TUG Comments still unsteady close SBA                         [facility] Adult PT Treatment/Exercise - 09/29/20 0001      Ambulation/Gait   Gait Comments gait 60 feet up and over stairs and rest 1 minute O2 drops to 88%, then up and over again and the 60 feet, on the way back she needed CGA and required 3 rest breaks.  We then walked 70 feet x 2 then rests, would take about 2 minutes to recover O2 to 92%      Lumbar Exercises: Seated   Long Arc Quad on Chair Both;2 sets;10 reps    LAQ on Chair Weights (lbs) 3#    Other Seated Lumbar Exercises marching 3# 2x10 each, 2# bicep curls    Other Seated Lumbar Exercises red tband knee flexion 2x10                    PT Short Term Goals - 12/04/19 1627      PT SHORT TERM GOAL #1   Title independent with intial HEP    Status Achieved             PT Long Term Goals - 09/14/20 1401      PT LONG TERM GOAL #1   Title increase overall hip LE strength for functional gait and safety hips and ankles to 4+/5    Status On-going      PT LONG TERM GOAL #2   Title decrease TUG time to less than 14 seconds for functional gait and safety    Status On-going                 Plan - 09/29/20 1803    Clinical  Impression Statement Patient had a fall last week, she and her husband report that she has not been  doing well.  REports that walking has been more diffiuclt and more unsteady.  She had some incresaed c/o back pain, I put heat on the back as we did the seated exercsies for about 10 minutes.  With the walking she did well the first time, but the second time she required 3 rest breaks in 60 feet.  The next two times she would need the same rest due to fatigue in the legs and some shortness of breath, she had O2 drop only to 88% but with rest and breathing it would return to 92% on the 4L O2    PT Next Visit Plan Patient has really struggled over the past 6 weeks.  Continue to try tohelp her function and safety    Consulted and Agree with Plan of Care Patient           Patient will benefit from skilled therapeutic intervention in order to improve the following deficits and impairments:  Abnormal gait,Decreased range of motion,Difficulty walking,Increased muscle spasms,Decreased activity tolerance,Pain,Decreased balance,Impaired flexibility,Improper body mechanics,Postural dysfunction,Decreased strength,Decreased mobility  Visit Diagnosis: Muscle weakness (generalized)  Risk for falls  Difficulty in walking, not elsewhere classified  Physical debility  Acute bilateral low back pain without sciatica  Muscle spasm of back     Problem List Patient Active Problem List   Diagnosis Date Noted  . COPD (chronic obstructive pulmonary disease) ([place]) 07/26/2020  . Pulmonary nodule 07/26/2020  . Abnormal CT of the abdomen   . Thyroid disease   . Seizures ([place])   . Hyperlipidemia   . GERD (gastroesophageal reflux disease)   . Diverticulosis   . Depression   . Complication of anesthesia   . Arthritis   . Hypoxia 03/23/2020  . Acute cystitis with hematuria 12/19/2019  . Hypomagnesemia 07/11/2017  . Nausea vomiting and diarrhea 07/10/2017  . Hypokalemia 07/10/2017  . Chronic diastolic CHF  (congestive heart failure) ([place]) 07/10/2017  . Seizure ([place]) 07/10/2017  . OSA (obstructive sleep apnea) 07/10/2017  . Sepsis ([place]) 07/10/2017  . Seizure disorder ([place]) 07/05/2017  . Pulmonary hypertension ([employer]) 07/05/2017  . Anxiety state   . Physical debility 06/23/2017  . 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 History of acute respiratory failure   . Status epilepticus ([place])   . Benign essential HTN   . Acute blood loss anemia   . Diverticulitis of colon   . Chronic respiratory failure with hypoxia ([place])   . Acute encephalopathy 06/06/2017  . Diverticulitis of large intestine without perforation or abscess without bleeding   . Non-intractable cyclical vomiting with nausea   . Hypocalcemia 05/27/2017  . 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 Colitis 05/27/2017  . Central retinal vein occlusion of right eye 04/20/2016  . Nuclear sclerotic cataract of both eyes 04/20/2016  . Diabetic foot infection ([place]) 07/21/2015  . Diabetes mellitus type 2, controlled ([place]) 07/21/2015  . Cellulitis of foot, right 07/21/2015  . Acute pulmonary embolism ([place]) 04/01/2013  . DVT (deep venous thrombosis) ([place]) 04/01/2013  . HX: breast cancer 04/01/2013  . Concussion 04/01/2013  . Essential hypertension, benign 04/01/2013  . Hypothyroidism 04/01/2013  . Cancer of left breast [facility]) 2006    [name] Boast., PT 09/29/2020, 6:10 PM  [place]. [place], [name], [address] Phone: [phone]   Fax:  [phone]  Name: Janesia Joswick MRN: [id number] Date of Birth: [birth year]

## 2020-09-30 ENCOUNTER — Encounter: Payer: Self-pay | Admitting: Physical Therapy

## 2020-09-30 ENCOUNTER — Ambulatory Visit: Payer: Medicare PPO | Admitting: Physical Therapy

## 2020-09-30 DIAGNOSIS — Z9181 History of falling: Secondary | ICD-10-CM

## 2020-09-30 DIAGNOSIS — M6281 Muscle weakness (generalized): Secondary | ICD-10-CM

## 2020-09-30 DIAGNOSIS — R262 Difficulty in walking, not elsewhere classified: Secondary | ICD-10-CM

## 2020-09-30 DIAGNOSIS — R5381 Other malaise: Secondary | ICD-10-CM

## 2020-09-30 DIAGNOSIS — M545 Low back pain, unspecified: Secondary | ICD-10-CM

## 2020-09-30 NOTE — Therapy (Signed)
[place]. [place], [name], [address] Phone: [phone]   Fax:  [phone]  Physical Therapy Treatment  Patient Details  Name: Amanda Short MRN: [id number] Date of Birth: 1940/12/19 Referring Provider (PT): Rennard   Encounter Date: 09/30/2020   PT End of Session - 09/30/20 1530    Visit Number 79    Number of Visits 86    Date for PT Re-Evaluation 10/14/20    Authorization Type [employer]    PT Start Time 1440    PT Stop Time 1521    PT Time Calculation (min) 41 min    Activity Tolerance Patient tolerated treatment well;Patient limited by fatigue    Behavior During Therapy [facility] for tasks assessed/performed           Past Medical History:  Diagnosis Date  . Abdominal pain   . Abnormal CT of the abdomen   . Acute blood loss anemia   . Acute cystitis with hematuria 12/19/2019  . Acute encephalopathy 06/06/2017  . Acute pulmonary embolism ([place]) 04/01/2013  . Acute respiratory failure with hypoxia ([place])   . Anxiety state   . Arthritis    "back, hands" (07/22/2015)  . Aspiration pneumonia ([place])   . Benign essential HTN   . Cancer of left breast ([place]) 2006   S/P lumpectomy  . Cellulitis of foot, right 07/21/2015  . Cellulitis of right foot 07/21/2015  . Central retinal vein occlusion of right eye 04/20/2016  . Chronic diastolic CHF (congestive heart failure) ([place]) 07/10/2017  . Colitis 05/27/2017  . Complication of anesthesia    "brief breathing problem at surgery center in ~ 2005 when I had gallbladder OR"  . Concussion 03/2013   due to fall  . Depression   . Diabetes ([place]) 04/01/2013  . 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 Diabetes mellitus type 2, controlled ([place]) 07/21/2015  . Diabetic foot infection ([place]) 07/21/2015  . Diverticulitis of colon   . Diverticulitis of large intestine without perforation or abscess without bleeding   . Diverticulosis   . DVT (deep venous thrombosis) ([place]) 03/2013    LLE  . Encephalopathy   . Encounter for attention to tracheostomy ([place])   . Encounter for central line placement   . Encounter for orogastric (OG) tube placement   . Essential hypertension, benign 04/01/2013  . GERD (gastroesophageal reflux disease)   . History of acute respiratory failure   . History of pulmonary embolism   . HX: breast cancer 04/01/2013  . Hyperlipidemia   . Hypertension   . Hypocalcemia 05/27/2017  . Hypokalemia 07/10/2017  . Hypomagnesemia 07/11/2017  . Hypothyroidism   . Hypoxia 03/23/2020  . Nausea vomiting and diarrhea 07/10/2017  . Non-intractable cyclical vomiting with nausea   . Nuclear sclerotic cataract of both eyes 04/20/2016  . OSA (obstructive sleep apnea) 07/10/2017  . OSA on CPAP 04/01/2013  . OSA treated with BiPAP   . Physical debility 06/23/2017  . 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 Pulmonary embolism ([place]) 03/2013  . Pulmonary HTN ([place]) 07/05/2017  . Seizure ([place]) 07/10/2017  . Seizure disorder ([place]) 07/05/2017  . Seizures ([place])   . Sepsis ([place]) 07/10/2017  . Sleep apnea   . Status epilepticus ([place])   . 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 Tachypnea   . Thyroid disease    Hypothyroid  . Tracheostomy, acute management ([place])   . Type II diabetes mellitus ([place])     Past Surgical History:  Procedure Laterality Date  . ABDOMINAL HYSTERECTOMY  1970s  . BREAST BIOPSY Left 2006  . BREAST LUMPECTOMY Left 2006  . BUNIONECTOMY WITH HAMMERTOE RECONSTRUCTION Left   . CARDIAC CATHETERIZATION  06/2020  . ESOPHAGOGASTRODUODENOSCOPY N/A 05/29/2017   Procedure: ESOPHAGOGASTRODUODENOSCOPY (EGD);  Surgeon: Irene Shipper, MD;  Location: [facility];  Service: Endoscopy;  Laterality: N/A;  . JOINT REPLACEMENT    . LAPAROSCOPIC CHOLECYSTECTOMY  ~ 2005  . Pulmonary function test  2-3 weeks ago  . RIGHT/LEFT HEART CATH AND CORONARY ANGIOGRAPHY N/A 05/24/2020   Procedure: RIGHT/LEFT HEART CATH AND CORONARY ANGIOGRAPHY;  Surgeon: Jolaine Artist, MD;   Location: [place] [facility];  Service: Cardiovascular;  Laterality: N/A;  . TOTAL HIP ARTHROPLASTY Left 2010  . TOTAL KNEE ARTHROPLASTY Bilateral 2005-2006    There were no vitals filed for this visit.   Subjective Assessment - 09/30/20 1446    Subjective I was very tired after the treatment yesterday, reports still some difficulty walking    Currently in Pain? No/denies                             [facility] Adult PT Treatment/Exercise - 09/30/20 0001      Ambulation/Gait   Gait Comments gait 60' x 3, then to 120 feet but needed 4 rest breaks due to fatigue in th elegs.      High Level Balance   High Level Balance Activities Side stepping;Backward walking;Negotiating over obstacles    High Level Balance Comments on airex red tband rows and extension with UE's, 4" toe touches, obstacle course, standing reaching      Lumbar Exercises: Aerobic   Nustep Level 5 x 6 minutes cues to stay at 60 steps per minute                    PT Short Term Goals - 12/04/19 1627      PT SHORT TERM GOAL #1   Title independent with intial HEP    Status Achieved             PT Long Term Goals - 09/30/20 1724      PT LONG TERM GOAL #1   Title increase overall hip LE strength for functional gait and safety hips and ankles to 4+/5    Status On-going      PT LONG TERM GOAL #2   Title decrease TUG time to less than 14 seconds for functional gait and safety    Status Partially Met      PT LONG TERM GOAL #3   Title increase BERG to 50/56 for safety and independence    Status Partially Met      PT LONG TERM GOAL #4   Title decrease LBP 50% with activities    Status Achieved                 Plan - 09/30/20 1530    Clinical Impression Statement Patient still struggling with fatigue, really needing rest at about 50 feet but can push to 60', her O2 saturation drops to 88% with this on 4L O2.  She needs close CGA for balancxe actities.  C/O fatigue in the legs  first and then in her breathing.  I am working to keep her functional with her abiltiy to walk in the home and be safe.    PT Next Visit Plan Patient has really struggled over the past 6 weeks.  Continue to try tohelp her function and safety    Consulted and Agree with Plan of Care Patient           Patient will benefit from skilled therapeutic intervention in order to improve the following deficits and impairments:  Abnormal gait,Decreased range of motion,Difficulty walking,Increased muscle spasms,Decreased activity tolerance,Pain,Decreased balance,Impaired flexibility,Improper body mechanics,Postural dysfunction,Decreased strength,Decreased mobility  Visit Diagnosis: Muscle weakness (generalized)  Risk for falls  Difficulty in walking, not elsewhere classified  Physical debility  Acute bilateral low back pain without sciatica     Problem List Patient Active Problem List   Diagnosis Date Noted  . COPD (chronic obstructive pulmonary disease) ([place]) 07/26/2020  . Pulmonary nodule 07/26/2020  . Abnormal CT of the abdomen   . Thyroid disease   . Seizures ([place])   . Hyperlipidemia   . GERD (gastroesophageal reflux disease)   . Diverticulosis   . Depression   . Complication of anesthesia   . Arthritis   . Hypoxia 03/23/2020  . Acute cystitis with hematuria 12/19/2019  . Hypomagnesemia 07/11/2017  . Nausea vomiting and diarrhea 07/10/2017  . Hypokalemia 07/10/2017  . Chronic diastolic CHF (congestive heart failure) ([place]) 07/10/2017  . Seizure ([place]) 07/10/2017  . OSA (obstructive sleep apnea) 07/10/2017  . Sepsis ([place]) 07/10/2017  . Seizure disorder ([place]) 07/05/2017  . Pulmonary hypertension ([place]) 07/05/2017  . Anxiety state   . Physical debility 06/23/2017  . 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 History of acute respiratory failure   . Status epilepticus ([place])   . Benign essential HTN   . Acute blood loss anemia   . Diverticulitis of colon   . Chronic respiratory  failure with hypoxia ([place])   . Acute encephalopathy 06/06/2017  . Diverticulitis of large intestine without perforation or abscess without bleeding   . Non-intractable cyclical vomiting with nausea   . Hypocalcemia 05/27/2017  . 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 Colitis 05/27/2017  . Central retinal vein occlusion of right eye 04/20/2016  . Nuclear sclerotic cataract of both eyes 04/20/2016  . Diabetic foot infection ([place]) 07/21/2015  . Diabetes mellitus type 2, controlled ([place]) 07/21/2015  . Cellulitis of foot, right 07/21/2015  . Acute pulmonary embolism ([place]) 04/01/2013  . DVT (deep venous thrombosis) ([place]) 04/01/2013  . HX: breast cancer 04/01/2013  . Concussion 04/01/2013  . Essential hypertension, benign 04/01/2013  . Hypothyroidism 04/01/2013  . Cancer of left breast [facility]) 2006    [name] Boast., PT 09/30/2020, 5:25 PM  [place]. [place], [name], [address] Phone: [phone]   Fax:  [phone]  Name: Amanda Short MRN: [id number] Date of Birth: [birth year]

## 2020-10-04 ENCOUNTER — Other Ambulatory Visit: Payer: Self-pay

## 2020-10-04 ENCOUNTER — Encounter: Payer: Self-pay | Admitting: Physical Therapy

## 2020-10-04 ENCOUNTER — Ambulatory Visit: Payer: Medicare PPO | Admitting: Physical Therapy

## 2020-10-04 DIAGNOSIS — M6281 Muscle weakness (generalized): Secondary | ICD-10-CM

## 2020-10-04 DIAGNOSIS — R262 Difficulty in walking, not elsewhere classified: Secondary | ICD-10-CM

## 2020-10-04 DIAGNOSIS — R5381 Other malaise: Secondary | ICD-10-CM

## 2020-10-04 DIAGNOSIS — M545 Low back pain, unspecified: Secondary | ICD-10-CM

## 2020-10-04 DIAGNOSIS — Z9181 History of falling: Secondary | ICD-10-CM

## 2020-10-04 NOTE — Therapy (Signed)
[place]. [place], [name], [address] Phone: [phone]   Fax:  [phone] Progress Note Reporting Period 07/22/20 to 10/04/20  See note below for Objective Data and Assessment of Progress/Goals.      Physical Therapy Treatment  Patient Details  Name: Amanda Short MRN: [id number] Date of Birth: 06-22-1941 Referring Provider (PT): Rennard   Encounter Date: 10/04/2020   PT End of Session - 10/04/20 1350    Visit Number 60    Number of Visits 14    Date for PT Re-Evaluation 10/14/20    Authorization Type [employer]    PT Start Time 1301    PT Stop Time 1348    PT Time Calculation (min) 47 min    Activity Tolerance Patient tolerated treatment well;Patient limited by fatigue    Behavior During Therapy [facility] for tasks assessed/performed           Past Medical History:  Diagnosis Date  . Abdominal pain   . Abnormal CT of the abdomen   . Acute blood loss anemia   . Acute cystitis with hematuria 12/19/2019  . Acute encephalopathy 06/06/2017  . Acute pulmonary embolism ([place]) 04/01/2013  . Acute respiratory failure with hypoxia ([place])   . Anxiety state   . Arthritis    "back, hands" (07/22/2015)  . Aspiration pneumonia ([place])   . Benign essential HTN   . Cancer of left breast ([place]) 2006   S/P lumpectomy  . Cellulitis of foot, right 07/21/2015  . Cellulitis of right foot 07/21/2015  . Central retinal vein occlusion of right eye 04/20/2016  . Chronic diastolic CHF (congestive heart failure) ([place]) 07/10/2017  . Colitis 05/27/2017  . Complication of anesthesia    "brief breathing problem at surgery center in ~ 2005 when I had gallbladder OR"  . Concussion 03/2013   due to fall  . Depression   . Diabetes ([place]) 04/01/2013  . 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 Diabetes mellitus type 2, controlled ([place]) 07/21/2015  . Diabetic foot infection ([place]) 07/21/2015  . Diverticulitis of colon   . Diverticulitis of  large intestine without perforation or abscess without bleeding   . Diverticulosis   . DVT (deep venous thrombosis) ([place]) 03/2013   LLE  . Encephalopathy   . Encounter for attention to tracheostomy ([place])   . Encounter for central line placement   . Encounter for orogastric (OG) tube placement   . Essential hypertension, benign 04/01/2013  . GERD (gastroesophageal reflux disease)   . History of acute respiratory failure   . History of pulmonary embolism   . HX: breast cancer 04/01/2013  . Hyperlipidemia   . Hypertension   . Hypocalcemia 05/27/2017  . Hypokalemia 07/10/2017  . Hypomagnesemia 07/11/2017  . Hypothyroidism   . Hypoxia 03/23/2020  . Nausea vomiting and diarrhea 07/10/2017  . Non-intractable cyclical vomiting with nausea   . Nuclear sclerotic cataract of both eyes 04/20/2016  . OSA (obstructive sleep apnea) 07/10/2017  . OSA on CPAP 04/01/2013  . OSA treated with BiPAP   . Physical debility 06/23/2017  . 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 Pulmonary embolism ([place]) 03/2013  . Pulmonary HTN ([place]) 07/05/2017  . Seizure ([place]) 07/10/2017  . Seizure disorder ([place]) 07/05/2017  . Seizures ([place])   . Sepsis ([place]) 07/10/2017  . Sleep apnea   . Status epilepticus ([place])   . 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 Tachypnea   . Thyroid disease  Hypothyroid  . Tracheostomy, acute management ([place])   . Type II diabetes mellitus ([place])     Past Surgical History:  Procedure Laterality Date  . ABDOMINAL HYSTERECTOMY  1970s  . BREAST BIOPSY Left 2006  . BREAST LUMPECTOMY Left 2006  . BUNIONECTOMY WITH HAMMERTOE RECONSTRUCTION Left   . CARDIAC CATHETERIZATION  06/2020  . ESOPHAGOGASTRODUODENOSCOPY N/A 05/29/2017   Procedure: ESOPHAGOGASTRODUODENOSCOPY (EGD);  Surgeon: Irene Shipper, MD;  Location: [facility];  Service: Endoscopy;  Laterality: N/A;  . JOINT REPLACEMENT    . LAPAROSCOPIC CHOLECYSTECTOMY  ~ 2005  . Pulmonary function test  2-3 weeks ago  . RIGHT/LEFT HEART CATH AND  CORONARY ANGIOGRAPHY N/A 05/24/2020   Procedure: RIGHT/LEFT HEART CATH AND CORONARY ANGIOGRAPHY;  Surgeon: Jolaine Artist, MD;  Location: [place] [facility];  Service: Cardiovascular;  Laterality: N/A;  . TOTAL HIP ARTHROPLASTY Left 2010  . TOTAL KNEE ARTHROPLASTY Bilateral 2005-2006    There were no vitals filed for this visit.   Subjective Assessment - 10/04/20 1315    Subjective Patient reports that she struggled some the past few hours trying to showe and dress without use of O2    Currently in Pain? No/denies                             [facility] Adult PT Treatment/Exercise - 10/04/20 0001      Ambulation/Gait   Gait Comments 60' x 3 with HHA, then 120' with 3-4 rests      Lumbar Exercises: Aerobic   Recumbent Bike 5 minutes level 1 encouragement to keep moving, did require rests every 45-60 seconds    Nustep Level 5 x 6 minutes cues to stay at 60 steps per minute      Lumbar Exercises: Standing   Other Standing Lumbar Exercises 3# hip abduction and hip extension 2x10    Other Standing Lumbar Exercises on airex red tband 2 ways, 6" toe touches with light HHA for balance, needed 2x with Mod A due to loss of balance      Lumbar Exercises: Seated   Long Arc Quad on Chair Both;2 sets;10 reps    Other Seated Lumbar Exercises marching 3# 2x10 each, 2# bicep curls                    PT Short Term Goals - 12/04/19 1627      PT SHORT TERM GOAL #1   Title independent with intial HEP    Status Achieved             PT Long Term Goals - 09/30/20 1724      PT LONG TERM GOAL #1   Title increase overall hip LE strength for functional gait and safety hips and ankles to 4+/5    Status On-going      PT LONG TERM GOAL #2   Title decrease TUG time to less than 14 seconds for functional gait and safety    Status Partially Met      PT LONG TERM GOAL #3   Title increase BERG to 50/56 for safety and independence    Status Partially Met      PT LONG  TERM GOAL #4   Title decrease LBP 50% with activities    Status Achieved                 Plan - 10/04/20 1351    Clinical Impression Statement starting out patient  O2 was 84%, had to have her work on breathing and then did a little exercise, once we did htis the O2 stayed above 92%, she reports that she had been without the O2 for a while due to showering and dressing.  She fatiuges easily and I worked on putting together a few exercises at a time prior to letting her rest.    PT Next Visit Plan Patient has really struggled over the past 6 weeks.  Continue to try to help her function and safety    Consulted and Agree with Plan of Care Patient           Patient will benefit from skilled therapeutic intervention in order to improve the following deficits and impairments:  Abnormal gait,Decreased range of motion,Difficulty walking,Increased muscle spasms,Decreased activity tolerance,Pain,Decreased balance,Impaired flexibility,Improper body mechanics,Postural dysfunction,Decreased strength,Decreased mobility  Visit Diagnosis: Muscle weakness (generalized)  Risk for falls  Difficulty in walking, not elsewhere classified  Physical debility  Acute bilateral low back pain without sciatica     Problem List Patient Active Problem List   Diagnosis Date Noted  . COPD (chronic obstructive pulmonary disease) ([place]) 07/26/2020  . Pulmonary nodule 07/26/2020  . Abnormal CT of the abdomen   . Thyroid disease   . Seizures ([place])   . Hyperlipidemia   . GERD (gastroesophageal reflux disease)   . Diverticulosis   . Depression   . Complication of anesthesia   . Arthritis   . Hypoxia 03/23/2020  . Acute cystitis with hematuria 12/19/2019  . Hypomagnesemia 07/11/2017  . Nausea vomiting and diarrhea 07/10/2017  . Hypokalemia 07/10/2017  . Chronic diastolic CHF (congestive heart failure) ([place]) 07/10/2017  . Seizure ([place]) 07/10/2017  . OSA (obstructive sleep apnea) 07/10/2017  . Sepsis  ([place]) 07/10/2017  . Seizure disorder ([place]) 07/05/2017  . Pulmonary hypertension ([place]) 07/05/2017  . Anxiety state   . Physical debility 06/23/2017  . 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 History of acute respiratory failure   . Status epilepticus ([place])   . Benign essential HTN   . Acute blood loss anemia   . Diverticulitis of colon   . Chronic respiratory failure with hypoxia ([place])   . Acute encephalopathy 06/06/2017  . Diverticulitis of large intestine without perforation or abscess without bleeding   . Non-intractable cyclical vomiting with nausea   . Hypocalcemia 05/27/2017  . 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 Colitis 05/27/2017  . Central retinal vein occlusion of right eye 04/20/2016  . Nuclear sclerotic cataract of both eyes 04/20/2016  . Diabetic foot infection ([place]) 07/21/2015  . Diabetes mellitus type 2, controlled ([place]) 07/21/2015  . Cellulitis of foot, right 07/21/2015  . Acute pulmonary embolism ([place]) 04/01/2013  . DVT (deep venous thrombosis) ([place]) 04/01/2013  . HX: breast cancer 04/01/2013  . Concussion 04/01/2013  . Essential hypertension, benign 04/01/2013  . Hypothyroidism 04/01/2013  . Cancer of left breast [facility]) 2006    [name] Boast., PT 10/04/2020, 1:53 PM  [place]. [place], [name], [address] Phone: [phone]   Fax:  [phone]  Name: Shernell Saldierna MRN: [id number] Date of Birth: [birth year]

## 2020-10-06 ENCOUNTER — Encounter: Payer: Self-pay | Admitting: Physical Therapy

## 2020-10-06 ENCOUNTER — Ambulatory Visit: Payer: Medicare PPO | Admitting: Physical Therapy

## 2020-10-06 ENCOUNTER — Other Ambulatory Visit: Payer: Self-pay

## 2020-10-06 DIAGNOSIS — M6281 Muscle weakness (generalized): Secondary | ICD-10-CM

## 2020-10-06 DIAGNOSIS — R5381 Other malaise: Secondary | ICD-10-CM

## 2020-10-06 DIAGNOSIS — M62830 Muscle spasm of back: Secondary | ICD-10-CM

## 2020-10-06 DIAGNOSIS — M545 Low back pain, unspecified: Secondary | ICD-10-CM

## 2020-10-06 DIAGNOSIS — R262 Difficulty in walking, not elsewhere classified: Secondary | ICD-10-CM

## 2020-10-06 DIAGNOSIS — Z9181 History of falling: Secondary | ICD-10-CM

## 2020-10-06 NOTE — Therapy (Signed)
[facility] [place], [name], [address] Phone: [phone]   Fax:  [phone]  Physical Therapy Treatment  Patient Details  Name: Amanda Short MRN: [id number] Date of Birth: 24-Jul-1940 Referring Provider (PT): Rennard   Encounter Date: 10/06/2020   PT End of Session - 10/06/20 5638    Visit Number 63    Number of Visits 49    Date for PT Re-Evaluation 10/14/20    Authorization Type [employer]    Activity Tolerance Patient tolerated treatment well;Patient limited by fatigue    Behavior During Therapy [facility] for tasks assessed/performed           Past Medical History:  Diagnosis Date  . Abdominal pain   . Abnormal CT of the abdomen   . Acute blood loss anemia   . Acute cystitis with hematuria 12/19/2019  . Acute encephalopathy 06/06/2017  . Acute pulmonary embolism ([place]) 04/01/2013  . Acute respiratory failure with hypoxia ([place])   . Anxiety state   . Arthritis    "back, hands" (07/22/2015)  . Aspiration pneumonia ([place])   . Benign essential HTN   . Cancer of left breast ([place]) 2006   S/P lumpectomy  . Cellulitis of foot, right 07/21/2015  . Cellulitis of right foot 07/21/2015  . Central retinal vein occlusion of right eye 04/20/2016  . Chronic diastolic CHF (congestive heart failure) ([place]) 07/10/2017  . Colitis 05/27/2017  . Complication of anesthesia    "brief breathing problem at surgery center in ~ 2005 when I had gallbladder OR"  . Concussion 03/2013   due to fall  . Depression   . Diabetes ([place]) 04/01/2013  . 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 Diabetes mellitus type 2, controlled ([place]) 07/21/2015  . Diabetic foot infection ([place]) 07/21/2015  . Diverticulitis of colon   . Diverticulitis of large intestine without perforation or abscess without bleeding   . Diverticulosis   . DVT (deep venous thrombosis) ([place]) 03/2013   LLE  . Encephalopathy   . Encounter for attention to tracheostomy ([place])   .  Encounter for central line placement   . Encounter for orogastric (OG) tube placement   . Essential hypertension, benign 04/01/2013  . GERD (gastroesophageal reflux disease)   . History of acute respiratory failure   . History of pulmonary embolism   . HX: breast cancer 04/01/2013  . Hyperlipidemia   . Hypertension   . Hypocalcemia 05/27/2017  . Hypokalemia 07/10/2017  . Hypomagnesemia 07/11/2017  . Hypothyroidism   . Hypoxia 03/23/2020  . Nausea vomiting and diarrhea 07/10/2017  . Non-intractable cyclical vomiting with nausea   . Nuclear sclerotic cataract of both eyes 04/20/2016  . OSA (obstructive sleep apnea) 07/10/2017  . OSA on CPAP 04/01/2013  . OSA treated with BiPAP   . Physical debility 06/23/2017  . 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 Pulmonary embolism ([place]) 03/2013  . Pulmonary HTN ([place]) 07/05/2017  . Seizure ([place]) 07/10/2017  . Seizure disorder ([place]) 07/05/2017  . Seizures ([place])   . Sepsis ([place]) 07/10/2017  . Sleep apnea   . Status epilepticus ([place])   . 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 Tachypnea   . Thyroid disease    Hypothyroid  . Tracheostomy, acute management ([place] )   . Type II diabetes mellitus ([place])     Past Surgical History:  Procedure Laterality Date  . ABDOMINAL HYSTERECTOMY  1970s  . BREAST BIOPSY Left 2006  . BREAST LUMPECTOMY Left 2006  .  BUNIONECTOMY WITH HAMMERTOE RECONSTRUCTION Left   . CARDIAC CATHETERIZATION  06/2020  . ESOPHAGOGASTRODUODENOSCOPY N/A 05/29/2017   Procedure: ESOPHAGOGASTRODUODENOSCOPY (EGD);  Surgeon: Irene Shipper, MD;  Location: [facility];  Service: Endoscopy;  Laterality: N/A;  . JOINT REPLACEMENT    . LAPAROSCOPIC CHOLECYSTECTOMY  ~ 2005  . Pulmonary function test  2-3 weeks ago  . RIGHT/LEFT HEART CATH AND CORONARY ANGIOGRAPHY N/A 05/24/2020   Procedure: RIGHT/LEFT HEART CATH AND CORONARY ANGIOGRAPHY;  Surgeon: Jolaine Artist, MD;  Location: [facility];  Service: Cardiovascular;  Laterality: N/A;  .  TOTAL HIP ARTHROPLASTY Left 2010  . TOTAL KNEE ARTHROPLASTY Bilateral 2005-2006    There were no vitals filed for this visit.   Subjective Assessment - 10/06/20 1310    Subjective Reports that she is feeling a little better today    Currently in Pain? Yes    Pain Score 2     Pain Location Back    Pain Descriptors / Indicators Sore    Aggravating Factors  back always hurts int he morning                             [facility] Adult PT Treatment/Exercise - 10/06/20 0001      Ambulation/Gait   Gait Comments 5 x 65' she had to have multiple rests on each 89' distance      High Level Balance   High Level Balance Activities Side stepping;Backward walking;Negotiating over obstacles      Lumbar Exercises: Aerobic   Nustep Level 5 x 6 minutes cues to stay at 60 steps per minute      Lumbar Exercises: Machines for Strengthening   Cybex Knee Extension 5# 3x10    Cybex Knee Flexion 25# 3x10                    PT Short Term Goals - 12/04/19 1627      PT SHORT TERM GOAL #1   Title independent with intial HEP    Status Achieved             PT Long Term Goals - 10/06/20 1356      PT LONG TERM GOAL #1   Title increase overall hip LE strength for functional gait and safety hips and ankles to 4+/5    Status On-going                 Plan - 10/06/20 1354    Clinical Impression Statement Fatigue and weakness seem to be more pronounced, she used to be able to do 15-20# leg extension today we did 5#, She is requiring multiple breaks (while standing to walk 65'", she c/o fatigue in the legs, her O2 today was 86% after the nustep but with cues to breath through her nose it quickly went to 94%    PT Next Visit Plan work on her functional endurance    Consulted and Agree with Plan of Care Patient           Patient will benefit from skilled therapeutic intervention in order to improve the following deficits and impairments:  Abnormal gait,Decreased range  of motion,Difficulty walking,Increased muscle spasms,Decreased activity tolerance,Pain,Decreased balance,Impaired flexibility,Improper body mechanics,Postural dysfunction,Decreased strength,Decreased mobility  Visit Diagnosis: Muscle weakness (generalized)  Risk for falls  Difficulty in walking, not elsewhere classified  Physical debility  Acute bilateral low back pain without sciatica  Muscle spasm of back     Problem  List Patient Active Problem List   Diagnosis Date Noted  . COPD (chronic obstructive pulmonary disease) ([place]) 07/26/2020  . Pulmonary nodule 07/26/2020  . Abnormal CT of the abdomen   . Thyroid disease   . Seizures ([place])   . Hyperlipidemia   . GERD (gastroesophageal reflux disease)   . Diverticulosis   . Depression   . Complication of anesthesia   . Arthritis   . Hypoxia 03/23/2020  . Acute cystitis with hematuria 12/19/2019  . Hypomagnesemia 07/11/2017  . Nausea vomiting and diarrhea 07/10/2017  . Hypokalemia 07/10/2017  . Chronic diastolic CHF (congestive heart failure) ([place]) 07/10/2017  . Seizure ([place]) 07/10/2017  . OSA (obstructive sleep apnea) 07/10/2017  . Sepsis ([place]) 07/10/2017  . Seizure disorder ([place]) 07/05/2017  . Pulmonary hypertension ([place]) 07/05/2017  . Anxiety state   . Physical debility 06/23/2017  . 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 History of acute respiratory failure   . Status epilepticus ([place])   . Benign essential HTN   . Acute blood loss anemia   . Diverticulitis of colon   . Chronic respiratory failure with hypoxia ([place])   . Acute encephalopathy 06/06/2017  . Diverticulitis of large intestine without perforation or abscess without bleeding   . Non-intractable cyclical vomiting with nausea   . Hypocalcemia 05/27/2017  . 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 Colitis 05/27/2017  . Central retinal vein occlusion of right eye 04/20/2016  . Nuclear sclerotic cataract of both eyes 04/20/2016  . Diabetic foot infection  ([place]) 07/21/2015  . Diabetes mellitus type 2, controlled ([place]) 07/21/2015  . Cellulitis of foot, right 07/21/2015  . Acute pulmonary embolism ([place]) 04/01/2013  . DVT (deep venous thrombosis) ([place]) 04/01/2013  . HX: breast cancer 04/01/2013  . Concussion 04/01/2013  . Essential hypertension, benign 04/01/2013  . Hypothyroidism 04/01/2013  . Cancer of left breast [facility]) 2006    [name] Boast., PT 10/06/2020, 2:46 PM  [facility]. [place], [name], [address] Phone: [phone]   Fax:  [phone]  Name: Amanda Short MRN: [id number] Date of Birth: 12/08/1940

## 2020-10-11 ENCOUNTER — Ambulatory Visit: Payer: Medicare PPO | Admitting: Physical Therapy

## 2020-10-12 ENCOUNTER — Other Ambulatory Visit: Payer: Self-pay

## 2020-10-12 ENCOUNTER — Encounter: Payer: Self-pay | Admitting: Cardiology

## 2020-10-12 ENCOUNTER — Ambulatory Visit: Payer: Medicare PPO | Admitting: Cardiology

## 2020-10-12 ENCOUNTER — Encounter: Payer: Self-pay | Admitting: Family Medicine

## 2020-10-12 VITALS — BP 102/60 | HR 48 | Ht 61.0 in | Wt 157.0 lb

## 2020-10-12 DIAGNOSIS — E1169 Type 2 diabetes mellitus with other specified complication: Secondary | ICD-10-CM

## 2020-10-12 DIAGNOSIS — G4733 Obstructive sleep apnea (adult) (pediatric): Secondary | ICD-10-CM

## 2020-10-12 DIAGNOSIS — I10 Essential (primary) hypertension: Secondary | ICD-10-CM

## 2020-10-12 DIAGNOSIS — R0902 Hypoxemia: Secondary | ICD-10-CM

## 2020-10-12 DIAGNOSIS — E785 Hyperlipidemia, unspecified: Secondary | ICD-10-CM

## 2020-10-12 DIAGNOSIS — J690 Pneumonitis due to inhalation of food and vomit: Secondary | ICD-10-CM

## 2020-10-12 DIAGNOSIS — Z8709 Personal history of other diseases of the respiratory system: Secondary | ICD-10-CM

## 2020-10-12 DIAGNOSIS — N182 Chronic kidney disease, stage 2 (mild): Secondary | ICD-10-CM

## 2020-10-12 DIAGNOSIS — G4730 Sleep apnea, unspecified: Secondary | ICD-10-CM

## 2020-10-12 DIAGNOSIS — Z9989 Dependence on other enabling machines and devices: Secondary | ICD-10-CM

## 2020-10-12 DIAGNOSIS — R5381 Other malaise: Secondary | ICD-10-CM

## 2020-10-12 DIAGNOSIS — S060X9A Concussion with loss of consciousness of unspecified duration, initial encounter: Secondary | ICD-10-CM

## 2020-10-12 DIAGNOSIS — E1369 Other specified diabetes mellitus with other specified complication: Secondary | ICD-10-CM

## 2020-10-12 DIAGNOSIS — J9611 Chronic respiratory failure with hypoxia: Secondary | ICD-10-CM

## 2020-10-12 DIAGNOSIS — Z853 Personal history of malignant neoplasm of breast: Secondary | ICD-10-CM

## 2020-10-12 DIAGNOSIS — E119 Type 2 diabetes mellitus without complications: Secondary | ICD-10-CM

## 2020-10-12 DIAGNOSIS — I272 Pulmonary hypertension, unspecified: Secondary | ICD-10-CM | POA: Diagnosis not present

## 2020-10-12 DIAGNOSIS — I5032 Chronic diastolic (congestive) heart failure: Secondary | ICD-10-CM | POA: Diagnosis not present

## 2020-10-12 DIAGNOSIS — L089 Local infection of the skin and subcutaneous tissue, unspecified: Secondary | ICD-10-CM

## 2020-10-12 DIAGNOSIS — F323 Major depressive disorder, single episode, severe with psychotic features: Secondary | ICD-10-CM

## 2020-10-12 DIAGNOSIS — I824Y9 Acute embolism and thrombosis of unspecified deep veins of unspecified proximal lower extremity: Secondary | ICD-10-CM | POA: Diagnosis not present

## 2020-10-12 DIAGNOSIS — H2513 Age-related nuclear cataract, bilateral: Secondary | ICD-10-CM

## 2020-10-12 DIAGNOSIS — E1122 Type 2 diabetes mellitus with diabetic chronic kidney disease: Secondary | ICD-10-CM

## 2020-10-12 DIAGNOSIS — I2699 Other pulmonary embolism without acute cor pulmonale: Secondary | ICD-10-CM

## 2020-10-12 DIAGNOSIS — G9340 Encephalopathy, unspecified: Secondary | ICD-10-CM

## 2020-10-12 DIAGNOSIS — J449 Chronic obstructive pulmonary disease, unspecified: Secondary | ICD-10-CM

## 2020-10-12 DIAGNOSIS — R9431 Abnormal electrocardiogram [ECG] [EKG]: Secondary | ICD-10-CM

## 2020-10-12 DIAGNOSIS — L03115 Cellulitis of right lower limb: Secondary | ICD-10-CM

## 2020-10-12 DIAGNOSIS — E876 Hypokalemia: Secondary | ICD-10-CM

## 2020-10-12 DIAGNOSIS — E11628 Type 2 diabetes mellitus with other skin complications: Secondary | ICD-10-CM

## 2020-10-12 MED ORDER — METOPROLOL SUCCINATE ER 50 MG PO TB24: 50.0000 mg | ORAL_TABLET | Freq: Every day | ORAL | 3 refills | Status: DC

## 2020-10-12 NOTE — Addendum Note (Signed)
Addended by: Orvan July on: 10/12/2020 03:03 PM   Modules accepted: Orders

## 2020-10-12 NOTE — Progress Notes (Signed)
Cardiology Office Note:    Date:  10/12/2020   ID:  Amanda Short, DOB [birth year], MRN [id number]  PCP:  Haydee Salter, MD  Cardiologist:  Jenne Campus, MD    Referring MD: Libby Maw,*   Chief Complaint  Patient presents with  . Palpitations  . chest tigthness with excessive activies    History of Present Illness:    Amanda Short is a 80 y.o. female   with past medical history significant for severe pulmonary hypertension with mean pulmonary artery pressure 56 mmHg, pulmonary artery wedge pressure 11, pulmonary vascular resistance 15, suspected predominantly group 3, COPD, obstructive sleep apnea, history of pulmonary emboli with negative VQ scan in August 2021, essential hypertension, dyslipidemia. Comes today 2 months of follow-up she did see pulmonary she was asked to wear oxygen all the time.  She is also waiting to have a sleep study.  Overall she seems to be doing better.  She said she got good days and bad days and good days she is able to do some on the bed that she will get short of breath quite easily.  Describe also have some tightness in the chest when she does take which I think is related to pulmonary hypertension.  Past Medical History:  Diagnosis Date  . Abdominal pain   . Abnormal CT of the abdomen   . Acute blood loss anemia   . Acute cystitis with hematuria 12/19/2019  . Acute encephalopathy 06/06/2017  . Acute pulmonary embolism ([place]) 04/01/2013  . Acute respiratory failure with hypoxia ([place])   . Anxiety state   . Arthritis    "back, hands" (07/22/2015)  . Aspiration pneumonia ([place])   . Benign essential HTN   . Cancer of left breast ([place]) 2006   S/P lumpectomy  . Cellulitis of foot, right 07/21/2015  . Cellulitis of right foot 07/21/2015  . Central retinal vein occlusion of right eye 04/20/2016  . Chronic diastolic CHF (congestive heart failure) ([place]) 07/10/2017  . Colitis 05/27/2017  . Complication of anesthesia    "brief breathing  problem at surgery center in ~ 2005 when I had gallbladder OR"  . Concussion 03/2013   due to fall  . Depression   . Diabetes ([place]) 04/01/2013  . 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 Diabetes mellitus type 2, controlled ([place]) 07/21/2015  . Diabetic foot infection ([place]) 07/21/2015  . Diverticulitis of colon   . Diverticulitis of large intestine without perforation or abscess without bleeding   . Diverticulosis   . DVT (deep venous thrombosis) ([place]) 03/2013   LLE  . Encephalopathy   . Encounter for attention to tracheostomy ([place])   . Encounter for central line placement   . Encounter for orogastric (OG) tube placement   . Essential hypertension, benign 04/01/2013  . GERD (gastroesophageal reflux disease)   . History of acute respiratory failure   . History of pulmonary embolism   . HX: breast cancer 04/01/2013  . Hyperlipidemia   . Hypertension   . Hypocalcemia 05/27/2017  . Hypokalemia 07/10/2017  . Hypomagnesemia 07/11/2017  . Hypothyroidism   . Hypoxia 03/23/2020  . Nausea vomiting and diarrhea 07/10/2017  . Non-intractable cyclical vomiting with nausea   . Nuclear sclerotic cataract of both eyes 04/20/2016  . OSA (obstructive sleep apnea) 07/10/2017  . OSA on CPAP 04/01/2013  . OSA treated with BiPAP   . Physical debility 06/23/2017  . 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 Pulmonary embolism ([place]) 03/2013  .  Pulmonary HTN ([place]) 07/05/2017  . Seizure ([place]) 07/10/2017  . Seizure disorder ([place]) 07/05/2017  . Seizures ([place])   . Sepsis ([place]) 07/10/2017  . Sleep apnea   . Status epilepticus ([place])   . 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 Tachypnea   . Thyroid disease    Hypothyroid  . Tracheostomy, acute management ([place])   . Type II diabetes mellitus ([place])     Past Surgical History:  Procedure Laterality Date  . ABDOMINAL HYSTERECTOMY  1970s  . BREAST BIOPSY Left 2006  . BREAST LUMPECTOMY Left 2006  . BUNIONECTOMY WITH HAMMERTOE RECONSTRUCTION Left   . CARDIAC  CATHETERIZATION  06/2020  . ESOPHAGOGASTRODUODENOSCOPY N/A 05/29/2017   Procedure: ESOPHAGOGASTRODUODENOSCOPY (EGD);  Surgeon: Irene Shipper, MD;  Location: [facility];  Service: Endoscopy;  Laterality: N/A;  . JOINT REPLACEMENT    . LAPAROSCOPIC CHOLECYSTECTOMY  ~ 2005  . Pulmonary function test  2-3 weeks ago  . RIGHT/LEFT HEART CATH AND CORONARY ANGIOGRAPHY N/A 05/24/2020   Procedure: RIGHT/LEFT HEART CATH AND CORONARY ANGIOGRAPHY;  Surgeon: Jolaine Artist, MD;  Location: [facility];  Service: Cardiovascular;  Laterality: N/A;  . TOTAL HIP ARTHROPLASTY Left 2010  . TOTAL KNEE ARTHROPLASTY Bilateral 2005-2006    Current Medications: Current Meds  Medication Sig  . acetaminophen (TYLENOL) 500 MG tablet Take 500-1,000 mg by mouth every 6 (six) hours as needed for mild pain.   Marland Kitchen ALPRAZolam (XANAX) 0.5 MG tablet TAKE 1 TABLET BY MOUTH EVERYDAY AT BEDTIME (Patient taking differently: Take 0.5 mg by mouth at bedtime.)  . atorvastatin (LIPITOR) 20 MG tablet Take 20 mg by mouth daily.  . benzonatate (TESSALON PERLES) 100 MG capsule Take 1 capsule (100 mg total) by mouth 3 (three) times daily as needed. (Patient taking differently: Take 100 mg by mouth 3 (three) times daily as needed for cough.)  . calcium-vitamin D (OSCAL WITH D) 500-200 MG-UNIT tablet Take 1 tablet by mouth 2 (two) times daily.  Marland Kitchen diltiazem (CARDIZEM) 30 MG tablet Take 30 mg by mouth 2 (two) times daily.   . DULoxetine (CYMBALTA) 60 MG capsule Take 1 capsule (60 mg total) by mouth daily.  . febuxostat (ULORIC) 40 MG tablet Take 40 mg daily by mouth.  . furosemide (LASIX) 40 MG tablet Take 40 mg by mouth daily.  . insulin degludec (TRESIBA FLEXTOUCH) 100 UNIT/ML SOPN FlexTouch Pen Inject 26 Units into the skin daily.   Marland Kitchen levETIRAcetam (KEPPRA) 500 MG tablet Take 500 mg by mouth 2 (two) times daily.  Marland Kitchen levothyroxine (SYNTHROID) 100 MCG tablet Take 1 tablet (100 mcg total) by mouth daily before breakfast.  .  loratadine (CLARITIN) 10 MG tablet Take 10 mg by mouth daily as needed for allergies.  . metoprolol (TOPROL-XL) 200 MG 24 hr tablet Take 100 mg by mouth at bedtime.   . montelukast (SINGULAIR) 10 MG tablet Take 10 mg at bedtime by mouth.  . Multiple Vitamin (MULTIVITAMIN WITH MINERALS) TABS tablet Take 1 tablet by mouth daily. Unknown strengfth  . nateglinide (STARLIX) 120 MG tablet Take 120 mg by mouth daily.   Marland Kitchen omeprazole (PRILOSEC) 40 MG capsule Take 40 mg by mouth 2 (two) times daily.  . OXYGEN Inhale 4 L/min into the lungs as directed. 24 hours a day  . Rivaroxaban (XARELTO) 15 MG TABS tablet Take 15 mg by mouth daily.  . Semaglutide, 1 MG/DOSE, (OZEMPIC, 1 MG/DOSE,) 2 MG/1.5ML SOPN Inject 1 mg into the skin once a week.  . spironolactone (ALDACTONE) 25  MG tablet Take 12.5 mg by mouth daily.  . Tiotropium Bromide-Olodaterol (STIOLTO RESPIMAT) 2.5-2.5 MCG/ACT AERS Inhale 2 puffs into the lungs daily.     Allergies:   Allopurinol and Aspirin   Social History   Socioeconomic History  . Marital status: Married    Spouse name: Not on file  . Number of children: 1  . Years of education: Not on file  . Highest education level: Not on file  Occupational History  . Occupation: retired  Tobacco Use  . Smoking status: Former Smoker    Packs/day: 1.00    Years: 30.00    Pack years: 30.00    Types: Cigarettes    Quit date: 1995    Years since quitting: 27.2  . Smokeless tobacco: Never Used  . Tobacco comment: "quit smoking cigarettes in the 1990s"  Vaping Use  . Vaping Use: Never used  Substance and Sexual Activity  . Alcohol use: No  . Drug use: No  . Sexual activity: Not Currently  Other Topics Concern  . Not on file  Social History Narrative  . Not on file   Social Determinants of Health   Financial Resource Strain: Low Risk   . Difficulty of Paying Living Expenses: Not hard at all  Food Insecurity: No Food Insecurity  . Worried About Charity fundraiser in the Last  Year: Never true  . Ran Out of Food in the Last Year: Never true  Transportation Needs: No Transportation Needs  . Lack of Transportation (Medical): No  . Lack of Transportation (Non-Medical): No  Physical Activity: Insufficiently Active  . Days of Exercise per Week: 4 days  . Minutes of Exercise per Session: 30 min  Stress: No Stress Concern Present  . Feeling of Stress : Not at all  Social Connections: Moderately Isolated  . Frequency of Communication with Friends and Family: More than three times a week  . Frequency of Social Gatherings with Friends and Family: Never  . Attends Religious Services: Never  . Active Member of Clubs or Organizations: No  . Attends Archivist Meetings: Never  . Marital Status: Married     Family History: The patient's family history includes Breast cancer in her cousin and sister; Colon cancer in her maternal grandmother; Diabetes in her sister and another family member; Hypertension in her mother; Kidney disease in her sister; Liver cancer in her mother; Uterine cancer in her mother. ROS:   Please see the history of present illness.    All 14 point review of systems negative except as described per history of present illness  EKGs/Labs/Other Studies Reviewed:    Cardiac catheterization done in November 2021 showed: Findings:  Ao = 150/81 (109) LV =  150/18 RA =  12 RV =  94/13 PA =  92/36 (56) PCW = 11 Fick cardiac output/index = 3.0/1.8 PVR = 15.1 WU FA sat = 96% PA sat = 61%, 60%  Assessment: 1. Minimal non-obstructive CAD 2. Normal LV function 3. Severe PAH with cor pulmonale    Recent Labs: 02/16/2020: ALT 8 05/14/2020: Platelets 248 05/24/2020: Hemoglobin 13.9 08/27/2020: BUN 37; Creat 1.45; Potassium 4.8; Sodium 140; TSH 0.99  Recent Lipid Panel    Component Value Date/Time   CHOL 101 08/27/2020 1421   CHOL 131 07/13/2020 1556   TRIG 157 (H) 08/27/2020 1421   HDL 43 (L) 08/27/2020 1421   HDL 49 07/13/2020 1556    CHOLHDL 2.3 08/27/2020 1421   VLDL 29 07/11/2017 0347  LDLCALC 35 08/27/2020 1421    Physical Exam:    VS:  BP 102/60 (BP Location: Left Arm, Patient Position: Sitting)   Pulse (!) 48   Ht _0  (1.549 m)   Wt 157 lb (71.2 kg)   SpO2 (!) 81%   BMI 29.66 kg/m     Wt Readings from Last 3 Encounters:  10/12/20 157 lb (71.2 kg)  09/01/20 159 lb 6.4 oz (72.3 kg)  08/27/20 158 lb 3.2 oz (71.8 kg)     GEN:  Well nourished, well developed in no acute distress HEENT: Normal NECK: No JVD; No carotid bruits LYMPHATICS: No lymphadenopathy CARDIAC: RRR, no murmurs, no rubs, no gallops RESPIRATORY:  Clear to auscultation without rales, wheezing or rhonchi  ABDOMEN: Soft, non-tender, non-distended MUSCULOSKELETAL:  No edema; No deformity  SKIN: Warm and dry LOWER EXTREMITIES: no swelling NEUROLOGIC:  Alert and oriented x 3 PSYCHIATRIC:  Normal affect   ASSESSMENT:    1. Deep vein thrombosis (DVT) of proximal lower extremity, unspecified chronicity, unspecified laterality ([place])   2. Essential hypertension, benign   3. Benign essential HTN   4. Pulmonary hypertension (HCC)   5. Chronic diastolic CHF (congestive heart failure) (HCC)   6. Pulmonary embolism, unspecified chronicity, unspecified pulmonary embolism type, unspecified whether acute cor pulmonale present ([place])   7. Primary hypertension   8. Chronic respiratory failure with hypoxia (HCC)   9. OSA (obstructive sleep apnea)   10. Hypoxia   11. OSA on CPAP   12. Aspiration pneumonia, unspecified aspiration pneumonia type, unspecified laterality, unspecified part of lung ([place])   13. OSA treated with BiPAP   14. Sleep apnea, unspecified type   15. Diabetic foot infection ([place])   16. Controlled type 2 diabetes mellitus with stage 2 chronic kidney disease, without long-term current use of insulin ([place])   17. Other specified diabetes mellitus with other specified complication, unspecified whether long term insulin use ([place])    18. Diabetes mellitus type 2 in nonobese (HCC)   19. Type 2 diabetes mellitus with other specified complication, unspecified whether long term insulin use ([place])   20. Encephalopathy   21. Cellulitis of right foot   22. HX: breast cancer   23. Concussion with loss of consciousness, initial encounter   24. Hypocalcemia   25. Prolonged QT interval   26. Physical debility   27. History of acute respiratory failure   28. Hypokalemia   29. Hypomagnesemia   30. Nuclear sclerotic cataract of both eyes   31. Hyperlipidemia, unspecified hyperlipidemia type   32. Current severe episode of major depressive disorder with psychotic features, unspecified whether recurrent ([place])   33. Chronic obstructive pulmonary disease, unspecified COPD type ([place])    PLAN:    In order of problems listed above:  1. Pulmonary hypertension that being managed by our pulmonary hypertension clinic as well as pulmonary.  She is awaiting for a sleep study.  He is on oxygen all the time which I will continue.  Overall she seems to be stable. 2. Chronic diastolic congestive heart failure is to be compensated.  I am concerned about her bradycardia today EKG will be done and then medication will be modified if needed. 3. Essential hypertension actually her blood pressure is on the lower side and again anticipate need to lower her beta-blocker. 4. Obstructive sleep apnea awaiting sleep study. 5. Dyslipidemia: I did review her K PN which show me her LDL of 35 HDL 43 this is from 11 February of this year.  We will continue present management.   Medication Adjustments/Labs and Tests Ordered: Current medicines are reviewed at length with the patient today.  Concerns regarding medicines are outlined above.  No orders of the defined types were placed in this encounter.  Medication changes: No orders of the defined types were placed in this encounter.   Signed, Park Liter, MD, [facility] 10/12/2020 2:37 PM    [place]

## 2020-10-12 NOTE — Patient Instructions (Addendum)
Medication Instructions:  Your physician has recommended you make the following change in your medication:  DECREASE: Metoprolol (Toprol-XL) 50 mg once daily *If you need a refill on your cardiac medications before your next appointment, please call your pharmacy*   Lab Work: None If you have labs (blood work) drawn today and your tests are completely normal, you will receive your results only by: Marland Kitchen MyChart Message (if you have MyChart) OR . A paper copy in the mail If you have any lab test that is abnormal or we need to change your treatment, we will call you to review the results.   Testing/Procedures: None   Follow-Up: At [facility], you and your health needs are our priority.  As part of our continuing mission to provide you with exceptional heart care, we have created designated Provider Care Teams.  These Care Teams include your primary Cardiologist (physician) and Advanced Practice Providers (APPs -  Physician Assistants and Nurse Practitioners) who all work together to provide you with the care you need, when you need it.  We recommend signing up for the patient portal called "MyChart".  Sign up information is provided on this After Visit Summary.  MyChart is used to connect with patients for Virtual Visits (Telemedicine).  Patients are able to view lab/test results, encounter notes, upcoming appointments, etc.  Non-urgent messages can be sent to your provider as well.   To learn more about what you can do with MyChart, go to [email or web address].    Your next appointment:   3 month(s)  The format for your next appointment:   In Person  Provider:   Jenne Campus, MD   Other Instructions

## 2020-10-13 ENCOUNTER — Ambulatory Visit: Payer: Medicare PPO | Admitting: Physical Therapy

## 2020-10-17 ENCOUNTER — Other Ambulatory Visit: Payer: Self-pay | Admitting: Family Medicine

## 2020-10-18 ENCOUNTER — Ambulatory Visit (HOSPITAL_BASED_OUTPATIENT_CLINIC_OR_DEPARTMENT_OTHER): Payer: Medicare PPO | Attending: Emergency Medicine | Admitting: Pulmonary Disease

## 2020-10-18 ENCOUNTER — Other Ambulatory Visit: Payer: Self-pay

## 2020-10-18 DIAGNOSIS — I272 Pulmonary hypertension, unspecified: Secondary | ICD-10-CM

## 2020-10-18 DIAGNOSIS — R0683 Snoring: Secondary | ICD-10-CM | POA: Insufficient documentation

## 2020-10-18 DIAGNOSIS — J9611 Chronic respiratory failure with hypoxia: Secondary | ICD-10-CM

## 2020-10-18 DIAGNOSIS — R0902 Hypoxemia: Secondary | ICD-10-CM | POA: Insufficient documentation

## 2020-10-18 NOTE — Telephone Encounter (Signed)
Refill request for   Xarelto 15 mg LR  Hx provider LOV 10/12/20 FOV  None scheduled.   Please review and advise.  Thanks.   Dm/cma

## 2020-10-19 DIAGNOSIS — J9611 Chronic respiratory failure with hypoxia: Secondary | ICD-10-CM | POA: Diagnosis not present

## 2020-10-19 DIAGNOSIS — R0683 Snoring: Secondary | ICD-10-CM | POA: Diagnosis not present

## 2020-10-19 DIAGNOSIS — I272 Pulmonary hypertension, unspecified: Secondary | ICD-10-CM | POA: Diagnosis not present

## 2020-10-19 NOTE — Procedures (Signed)
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  The channels recorded and monitored were frontal, central and occipital EEG, electrooculogram (EOG), submentalis EMG (chin), nasal and oral airflow, thoracic and abdominal wall motion, anterior tibialis EMG, snore microphone, electrocardiogram, and pulse oximetry.  MEDICATIONS Medications self-administered by patient taken the night of the study : ALPRAZOLAM, TYLENOL PM  SLEEP ARCHITECTURE The study was initiated at 10:07:06 PM and ended at 5:03:58 AM.  Sleep onset time was 72.1 minutes and the sleep efficiency was 44.3%%. The total sleep time was 184.5 minutes.  Stage REM latency was 253.0 minutes.  The patient spent 15.7%% of the night in stage N1 sleep, 81.8%% in stage N2 sleep, 0.0%% in stage N3 and 2.4% in REM.  Alpha intrusion was absent.  Supine sleep was 100.00%.  RESPIRATORY PARAMETERS The overall apnea/hypopnea index (AHI) was 3.6 per hour. There were 10 total apneas, including 8 obstructive, 2 central and 0 mixed apneas. There were 1 hypopneas and 35 RERAs.  The AHI during Stage REM sleep was 13.3 per hour.  AHI while supine was 3.6 per hour.  The mean oxygen saturation was 96.4%. The minimum SpO2 during sleep was 93.0%. Study was performed on 4 L O2  soft snoring was noted during this  study.  CARDIAC DATA The 2 lead EKG demonstrated sinus rhythm. The mean heart rate was 58.7 beats per minute. Other EKG findings include: None. LEG MOVEMENT DATA The total PLMS were 0 with a resulting PLMS index of 0.0. Associated arousal with leg movement index was 2.3 .  IMPRESSIONS - No significant obstructive sleep apnea occurred during this study (AHI = 3.6/h). - No significant central sleep apnea occurred during this study (CAI = 0.7/h). - The patient had minimal or no oxygen desaturation during the study (Min O2 = 93.0%) - The patient snored with soft snoring volume. - No cardiac abnormalities were noted during this study. - Clinically significant periodic limb movements did not occur during sleep. No significant associated arousals.  DIAGNOSIS - Nocturnal Hypoxemia (G47.36)   RECOMMENDATIONS - Continue 4L O2 during sleep - Avoid alcohol, sedatives and other CNS depressants that may worsen sleep apnea and disrupt normal sleep architecture. - Sleep hygiene should be reviewed to assess factors that may improve sleep quality. - Weight management and regular exercise should be initiated or continued if appropriate.   Kara Mead MD Board Certified in [place]

## 2020-10-21 ENCOUNTER — Other Ambulatory Visit: Payer: Self-pay

## 2020-10-21 ENCOUNTER — Ambulatory Visit (HOSPITAL_COMMUNITY)
Admission: RE | Admit: 2020-10-21 | Discharge: 2020-10-21 | Disposition: A | Discharge status: Home / Self Care | Payer: Medicare PPO | Source: Ambulatory Visit | Attending: Internal Medicine | Admitting: Internal Medicine

## 2020-10-21 ENCOUNTER — Encounter (HOSPITAL_COMMUNITY): Payer: Self-pay | Admitting: Internal Medicine

## 2020-10-21 VITALS — BP 118/78 | HR 68 | Wt 148.8 lb

## 2020-10-21 DIAGNOSIS — I272 Pulmonary hypertension, unspecified: Secondary | ICD-10-CM | POA: Insufficient documentation

## 2020-10-21 DIAGNOSIS — G4733 Obstructive sleep apnea (adult) (pediatric): Secondary | ICD-10-CM | POA: Diagnosis not present

## 2020-10-21 DIAGNOSIS — I2584 Coronary atherosclerosis due to calcified coronary lesion: Secondary | ICD-10-CM | POA: Diagnosis not present

## 2020-10-21 DIAGNOSIS — Z79890 Hormone replacement therapy (postmenopausal): Secondary | ICD-10-CM | POA: Diagnosis not present

## 2020-10-21 DIAGNOSIS — Z86711 Personal history of pulmonary embolism: Secondary | ICD-10-CM | POA: Diagnosis not present

## 2020-10-21 DIAGNOSIS — I2781 Cor pulmonale (chronic): Secondary | ICD-10-CM | POA: Diagnosis not present

## 2020-10-21 DIAGNOSIS — Z886 Allergy status to analgesic agent status: Secondary | ICD-10-CM | POA: Insufficient documentation

## 2020-10-21 DIAGNOSIS — Z87891 Personal history of nicotine dependence: Secondary | ICD-10-CM | POA: Insufficient documentation

## 2020-10-21 DIAGNOSIS — J9611 Chronic respiratory failure with hypoxia: Secondary | ICD-10-CM | POA: Diagnosis not present

## 2020-10-21 DIAGNOSIS — I10 Essential (primary) hypertension: Secondary | ICD-10-CM | POA: Diagnosis not present

## 2020-10-21 DIAGNOSIS — Z7984 Long term (current) use of oral hypoglycemic drugs: Secondary | ICD-10-CM | POA: Diagnosis not present

## 2020-10-21 DIAGNOSIS — Z96653 Presence of artificial knee joint, bilateral: Secondary | ICD-10-CM | POA: Diagnosis not present

## 2020-10-21 DIAGNOSIS — Z7951 Long term (current) use of inhaled steroids: Secondary | ICD-10-CM | POA: Diagnosis not present

## 2020-10-21 DIAGNOSIS — Z79899 Other long term (current) drug therapy: Secondary | ICD-10-CM | POA: Diagnosis not present

## 2020-10-21 DIAGNOSIS — Z7901 Long term (current) use of anticoagulants: Secondary | ICD-10-CM | POA: Insufficient documentation

## 2020-10-21 DIAGNOSIS — Z9981 Dependence on supplemental oxygen: Secondary | ICD-10-CM | POA: Insufficient documentation

## 2020-10-21 DIAGNOSIS — I2510 Atherosclerotic heart disease of native coronary artery without angina pectoris: Secondary | ICD-10-CM | POA: Diagnosis not present

## 2020-10-21 DIAGNOSIS — Z96642 Presence of left artificial hip joint: Secondary | ICD-10-CM | POA: Insufficient documentation

## 2020-10-21 DIAGNOSIS — Z86718 Personal history of other venous thrombosis and embolism: Secondary | ICD-10-CM | POA: Diagnosis not present

## 2020-10-21 MED ORDER — TYVASO 0.6 MG/ML IN SOLN: 18.0000 ug | Freq: Four times a day (QID) | RESPIRATORY_TRACT | Status: AC

## 2020-10-21 NOTE — Patient Instructions (Addendum)
Start Tyvaso, this is an inhaled medication used 4 times a day, it has to come from a specialty pharmacy, once approved by your insurance company the pharmacy will have a nurse work with you on using the inhaler and increasing the dose gradually  Your physician recommends that you schedule a follow-up appointment in: 4 months  If you have any questions or concerns before your next appointment please send [place] a message through [place] or call our office at [phone].    TO LEAVE A MESSAGE FOR THE NURSE SELECT OPTION 2, PLEASE LEAVE A MESSAGE INCLUDING: . YOUR NAME . DATE OF BIRTH . CALL BACK NUMBER . REASON FOR CALL**this is important as we prioritize the call backs  [place] AS LONG AS YOU CALL BEFORE 4:00 PM  At the [facility], you and your health needs are our priority. As part of our continuing mission to provide you with exceptional heart care, we have created designated Provider Care Teams. These Care Teams include your primary Cardiologist (physician) and Advanced Practice Providers (APPs- Physician Assistants and Nurse Practitioners) who all work together to provide you with the care you need, when you need it.   You may see any of the following providers on your designated Care Team at your next follow up: Marland Kitchen Dr Glori Bickers . Dr Loralie Champagne . Dr Vickki Muff . Darrick Grinder, NP . Lyda Jester, [place] . Audry Riles, PharmD   Please be sure to bring in all your medications bottles to every appointment.

## 2020-10-21 NOTE — Addendum Note (Signed)
Encounter addended by: Scarlette Calico, RN on: 10/21/2020 2:43 PM  Actions taken: Order list changed, Clinical Note Signed

## 2020-10-21 NOTE — Progress Notes (Addendum)
Advanced [facility] Clinic Note   Date:  10/21/2020   ID:  Amanda Short, DOB [birth year], MRN [id number]  Location: Home  Provider location: [facility] Type of Visit: Established patient  PCP:  Haydee Salter, MD  Cardiologist:  No primary care provider on file. Primary HF: Liem Copenhaver  Chief Complaint: PAH follow-up   History of Present Illness:  Amanda Harkleroadis a 80 y.o.femalewith DM2, HTN. OSA previously on CPAP), previous smoker (quit 1995), previous DVT/PE (2014) and h/o chronic respiratory failure s/p previous tracheostomy (removed) referred by Dr. Drue Dun for further evaluation of pulmonary HTN seen on echo.   In 2018 had severe n/v/d and led to intractable seizures. Got extubated but then developed recurrent distress but could not be re-intubated due to laryngeal swelling. Had emergent trach which was removed after several weeks. Smoked 1ppd x 20+ years. Quit in 1995.   Echo 7/21: LVEF 65-705 with restrictive diastolic filling  RV moderately enlarged with moderate HK. Mod to severe TR. Severe septal flattening RVSP estimated at 108.  CT abdomen 2/21: showed calcified granuloma in RLL VQ scan 8/21: Normal  I saw her for the first time in 10/21 and recommended R/L cath.   R/L 05/24/20 Minimal CAD (20%). Severe PAH Ao = 150/81 (109) LV = 150/18 RA = 12 RV = 94/13 PA = 92/36 (56) PCW = 11 Fick cardiac output/index = 3.0/1.8 PVR = 15.1 WU FA sat = 96% PA sat = 61%, 60%  PFTs  06/21/20 FEV1  1.11L (65%) FVC   1.65L  (71%) FEF 25-75  0.61L DLCO 35%  Post cath I recommended trial of Tyvaso but I wanted to maximize her oxygenation. O2 increased, Sleep study and Hi-res CT ordered. Referred to Pulmonary. Saw Dr. Lamonte Sakai and Stiolto added for COPD   HiRes chest CT 08/18/20: suggestive of early ILD and emphysema. Also with LM and 3v coronary calcifications.  Had sleep study in 2/22/ AHI 3.6  Here for f/u with her husband. Now  wearing 4L O2. Checks her sats about once a day and they are usually over 90s. Still gets SOB very easily. Can do ADLs but gets wiped out easily. No edema, orthopnea or PND.    Past Medical History:  Diagnosis Date  . Abdominal pain   . Abnormal CT of the abdomen   . Acute blood loss anemia   . Acute cystitis with hematuria 12/19/2019  . Acute encephalopathy 06/06/2017  . Acute pulmonary embolism ([place]) 04/01/2013  . Acute respiratory failure with hypoxia ([place])   . Anxiety state   . Arthritis    "back, hands" (07/22/2015)  . Aspiration pneumonia ([place])   . Benign essential HTN   . Cancer of left breast ([place]) 2006   S/P lumpectomy  . Cellulitis of foot, right 07/21/2015  . Cellulitis of right foot 07/21/2015  . Central retinal vein occlusion of right eye 04/20/2016  . Chronic diastolic CHF (congestive heart failure) ([place]) 07/10/2017  . Colitis 05/27/2017  . Complication of anesthesia    "brief breathing problem at surgery center in ~ 2005 when I had gallbladder OR"  . Concussion 03/2013   due to fall  . Depression   . Diabetes ([place]) 04/01/2013  . 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 Diabetes mellitus type 2, controlled ([place]) 07/21/2015  . Diabetic foot infection ([place]) 07/21/2015  . Diverticulitis of colon   . Diverticulitis of large intestine without perforation or abscess without bleeding   .  Diverticulosis   . DVT (deep venous thrombosis) ([place]) 03/2013   LLE  . Encephalopathy   . Encounter for attention to tracheostomy ([place])   . Encounter for central line placement   . Encounter for orogastric (OG) tube placement   . Essential hypertension, benign 04/01/2013  . GERD (gastroesophageal reflux disease)   . History of acute respiratory failure   . History of pulmonary embolism   . HX: breast cancer 04/01/2013  . Hyperlipidemia   . Hypertension   . Hypocalcemia 05/27/2017  . Hypokalemia 07/10/2017  . Hypomagnesemia 07/11/2017  . Hypothyroidism   . Hypoxia 03/23/2020  . Nausea  vomiting and diarrhea 07/10/2017  . Non-intractable cyclical vomiting with nausea   . Nuclear sclerotic cataract of both eyes 04/20/2016  . OSA (obstructive sleep apnea) 07/10/2017  . OSA on CPAP 04/01/2013  . OSA treated with BiPAP   . Physical debility 06/23/2017  . 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 Pulmonary embolism ([place]) 03/2013  . Pulmonary HTN ([place]) 07/05/2017  . Seizure ([place]) 07/10/2017  . Seizure disorder ([place]) 07/05/2017  . Seizures ([place])   . Sepsis ([place]) 07/10/2017  . Sleep apnea   . Status epilepticus ([place])   . 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 Tachypnea   . Thyroid disease    Hypothyroid  . Tracheostomy, acute management ([place])   . Type II diabetes mellitus ([place])    Past Surgical History:  Procedure Laterality Date  . ABDOMINAL HYSTERECTOMY  1970s  . BREAST BIOPSY Left 2006  . BREAST LUMPECTOMY Left 2006  . BUNIONECTOMY WITH HAMMERTOE RECONSTRUCTION Left   . CARDIAC CATHETERIZATION  06/2020  . ESOPHAGOGASTRODUODENOSCOPY N/A 05/29/2017   Procedure: ESOPHAGOGASTRODUODENOSCOPY (EGD);  Surgeon: Irene Shipper, MD;  Location: [facility];  Service: Endoscopy;  Laterality: N/A;  . JOINT REPLACEMENT    . LAPAROSCOPIC CHOLECYSTECTOMY  ~ 2005  . Pulmonary function test  2-3 weeks ago  . RIGHT/LEFT HEART CATH AND CORONARY ANGIOGRAPHY N/A 05/24/2020   Procedure: RIGHT/LEFT HEART CATH AND CORONARY ANGIOGRAPHY;  Surgeon: Jolaine Artist, MD;  Location: [facility];  Service: Cardiovascular;  Laterality: N/A;  . TOTAL HIP ARTHROPLASTY Left 2010  . TOTAL KNEE ARTHROPLASTY Bilateral 2005-2006     Current Outpatient Medications  Medication Sig Dispense Refill  . acetaminophen (TYLENOL) 500 MG tablet Take 500-1,000 mg by mouth every 6 (six) hours as needed for mild pain.     Marland Kitchen ALPRAZolam (XANAX) 0.5 MG tablet TAKE 1 TABLET BY MOUTH EVERYDAY AT BEDTIME (Patient taking differently: Take 0.5 mg by mouth at bedtime.) 30 tablet 4  . atorvastatin (LIPITOR) 20 MG tablet  Take 20 mg by mouth daily.    . benzonatate (TESSALON PERLES) 100 MG capsule Take 1 capsule (100 mg total) by mouth 3 (three) times daily as needed. (Patient taking differently: Take 100 mg by mouth 3 (three) times daily as needed for cough.) 20 capsule 0  . calcium-vitamin D (OSCAL WITH D) 500-200 MG-UNIT tablet Take 1 tablet by mouth 2 (two) times daily.    Marland Kitchen diltiazem (CARDIZEM) 30 MG tablet Take 30 mg by mouth 2 (two) times daily.     . DULoxetine (CYMBALTA) 60 MG capsule Take 1 capsule (60 mg total) by mouth daily. 90 capsule 3  . febuxostat (ULORIC) 40 MG tablet Take 40 mg daily by mouth.    . furosemide (LASIX) 40 MG tablet Take 40 mg by mouth daily.    . insulin degludec (TRESIBA FLEXTOUCH) 100 UNIT/ML SOPN FlexTouch Pen Inject  26 Units into the skin daily.     Marland Kitchen levETIRAcetam (KEPPRA) 500 MG tablet Take 500 mg by mouth 2 (two) times daily.    Marland Kitchen levothyroxine (SYNTHROID) 100 MCG tablet Take 1 tablet (100 mcg total) by mouth daily before breakfast. 90 tablet 3  . loratadine (CLARITIN) 10 MG tablet Take 10 mg by mouth daily as needed for allergies.    . metoprolol succinate (TOPROL XL) 50 MG 24 hr tablet Take 1 tablet (50 mg total) by mouth daily. Take with or immediately following a meal. 90 tablet 3  . montelukast (SINGULAIR) 10 MG tablet Take 10 mg at bedtime by mouth.    . Multiple Vitamin (MULTIVITAMIN WITH MINERALS) TABS tablet Take 1 tablet by mouth daily. Unknown strengfth    . nateglinide (STARLIX) 120 MG tablet Take 120 mg by mouth daily.     Marland Kitchen omeprazole (PRILOSEC) 40 MG capsule Take 40 mg by mouth 2 (two) times daily.    . OXYGEN Inhale 4 L/min into the lungs as directed. 24 hours a day    . Semaglutide, 1 MG/DOSE, (OZEMPIC, 1 MG/DOSE,) 2 MG/1.5ML SOPN Inject 1 mg into the skin once a week. 3 mL 5  . spironolactone (ALDACTONE) 25 MG tablet Take 12.5 mg by mouth daily.    . Tiotropium Bromide-Olodaterol (STIOLTO RESPIMAT) 2.5-2.5 MCG/ACT AERS Inhale 2 puffs into the lungs daily. 4  g 5  . XARELTO 15 MG TABS tablet TAKE 1 TABLET BY MOUTH EVERY DAY 30 tablet 3   No current facility-administered medications for this encounter.    Allergies:   Allopurinol and Aspirin   Social History:  The patient  reports that she quit smoking about 27 years ago. Her smoking use included cigarettes. She has a 30.00 pack-year smoking history. She has never used smokeless tobacco. She reports that she does not drink alcohol and does not use drugs.   Family History:  The patient's family history includes Breast cancer in her cousin and sister; Colon cancer in her maternal grandmother; Diabetes in her sister and another family member; Hypertension in her mother; Kidney disease in her sister; Liver cancer in her mother; Uterine cancer in her mother.   ROS:  Please see the history of present illness.   All other systems are personally reviewed and negative.   Vitals:   10/21/20 1409  BP: 118/78  Pulse: 68  SpO2: 94%  Weight: 67.5 kg (148 lb 12.8 oz)    Exam:  General:  Sitting in chair on O2 No resp difficulty HEENT: normal Neck: supple. no JVD. Carotids 2+ bilat; no bruits. No lymphadenopathy or thryomegaly appreciated. Cor: PMI nondisplaced. Regular rate & rhythm. No rubs, gallops or murmurs. Lungs: clear with decreased breath sounds Abdomen: soft, nontender, nondistended. No hepatosplenomegaly. No bruits or masses. Good bowel sounds. Extremities: no cyanosis, clubbing, rash, edema Neuro: alert & orientedx3, cranial nerves grossly intact. moves all 4 extremities w/o difficulty. Affect pleasant   Recent Labs: 02/16/2020: ALT 8 05/14/2020: Platelets 248 05/24/2020: Hemoglobin 13.9 08/27/2020: BUN 37; Creat 1.45; Potassium 4.8; Sodium 140; TSH 0.99  Personally reviewed   Wt Readings from Last 3 Encounters:  10/18/20 71.2 kg (157 lb)  10/12/20 71.2 kg (157 lb)  09/01/20 72.3 kg (159 lb 6.4 oz)      ASSESSMENT AND PLAN:   1. Pulmonary arterial HTN with cor pulmonale, severe   - Echo 7/21: LVEF 65-70% with restrictive diastolic filling  RV moderately enlarged with moderate HK. Mod to severe TR. Severe septal  flattening RVSP estimated at 108.  - CT abdomen 2/21: showed calcified granuloma in RLL - VQ scan 8/21: Normal - RHC 11/21 with severe PAH PA = 92/36 (56) PCW = 11 CI 1.8 PVR 15 - Continue supplemental O2. Now wearing 4L O2 with improved saturations  - PFTs 12/21 show mild to moderate obstruction but no hyperinflation. - Auto-immune serology negative - HiRes chest CT 08/18/20: suggestive of early ILD and emphysema. 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 - She has benefited Stiolto and increased O2 supplementation.  - Will start Tyvaso today.  - We discussed Pulmonary Rehab would like to defer for now. I told her I would keep bugging her about it  2. Chronic hypoxic respiratory failure - quit tobacco 1995 - on home O2 @ 4L encouraged her to wear O2 and check sats to keep > 90% - has h/o tracheostomy - PFTs 12/21 show mild to moderate obstruction but no hyperinflation.  - HiRes chest CT 08/18/20: suggestive of early ILD and emphysema. Also with LM and 3v coronary calcifications,  - Following with Dr. Lamonte Sakai in Pulmonary. Has benefitted from [employer] Tyvaso   3. OSA - AH 2/2 3.6 -> no significant OSA   Glori Bickers, MD  2:04 PM

## 2020-11-01 ENCOUNTER — Other Ambulatory Visit: Payer: Self-pay

## 2020-11-01 ENCOUNTER — Encounter: Payer: Self-pay | Admitting: Physical Therapy

## 2020-11-01 ENCOUNTER — Ambulatory Visit: Payer: Medicare PPO | Attending: Family Medicine | Admitting: Physical Therapy

## 2020-11-01 ENCOUNTER — Telehealth (HOSPITAL_COMMUNITY): Payer: Self-pay | Admitting: Pharmacist

## 2020-11-01 DIAGNOSIS — M62830 Muscle spasm of back: Secondary | ICD-10-CM | POA: Insufficient documentation

## 2020-11-01 DIAGNOSIS — R262 Difficulty in walking, not elsewhere classified: Secondary | ICD-10-CM

## 2020-11-01 DIAGNOSIS — M6281 Muscle weakness (generalized): Secondary | ICD-10-CM | POA: Diagnosis not present

## 2020-11-01 DIAGNOSIS — R2689 Other abnormalities of gait and mobility: Secondary | ICD-10-CM | POA: Insufficient documentation

## 2020-11-01 DIAGNOSIS — R5381 Other malaise: Secondary | ICD-10-CM

## 2020-11-01 DIAGNOSIS — Z9181 History of falling: Secondary | ICD-10-CM

## 2020-11-01 DIAGNOSIS — M545 Low back pain, unspecified: Secondary | ICD-10-CM | POA: Insufficient documentation

## 2020-11-01 NOTE — Therapy (Signed)
[facility]. [place], [name], [address] Phone: [phone]   Fax:  [phone]  Physical Therapy Treatment  Patient Details  Name: Amanda Short MRN: [id number] Date of Birth: December 19, 1940 Referring Provider (PT): Rennard   Encounter Date: 11/01/2020   PT End of Session - 11/01/20 1708    Visit Number 52    Number of Visits 83    Date for PT Re-Evaluation 01/10/21    Authorization Type [employer]    PT Start Time 9357    PT Stop Time 1610    PT Time Calculation (min) 40 min    Activity Tolerance Patient tolerated treatment well;Patient limited by fatigue    Behavior During Therapy [facility] for tasks assessed/performed           Past Medical History:  Diagnosis Date  . Abdominal pain   . Abnormal CT of the abdomen   . Acute blood loss anemia   . Acute cystitis with hematuria 12/19/2019  . Acute encephalopathy 06/06/2017  . Acute pulmonary embolism ([place]) 04/01/2013  . Acute respiratory failure with hypoxia ([place])   . Anxiety state   . Arthritis    "back, hands" (07/22/2015)  . Aspiration pneumonia ([place])   . Benign essential HTN   . Cancer of left breast ([place]) 2006   S/P lumpectomy  . Cellulitis of foot, right 07/21/2015  . Cellulitis of right foot 07/21/2015  . Central retinal vein occlusion of right eye 04/20/2016  . Chronic diastolic CHF (congestive heart failure) ([place]) 07/10/2017  . Colitis 05/27/2017  . Complication of anesthesia    "brief breathing problem at surgery center in ~ 2005 when I had gallbladder OR"  . Concussion 03/2013   due to fall  . Depression   . Diabetes ([place]) 04/01/2013  . 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 Diabetes mellitus type 2, controlled ([place]) 07/21/2015  . Diabetic foot infection ([place]) 07/21/2015  . Diverticulitis of colon   . Diverticulitis of large intestine without perforation or abscess without bleeding   . Diverticulosis   . DVT (deep venous thrombosis) ([place]) 03/2013    LLE  . Encephalopathy   . Encounter for attention to tracheostomy ([place])   . Encounter for central line placement   . Encounter for orogastric (OG) tube placement   . Essential hypertension, benign 04/01/2013  . GERD (gastroesophageal reflux disease)   . History of acute respiratory failure   . History of pulmonary embolism   . HX: breast cancer 04/01/2013  . Hyperlipidemia   . Hypertension   . Hypocalcemia 05/27/2017  . Hypokalemia 07/10/2017  . Hypomagnesemia 07/11/2017  . Hypothyroidism   . Hypoxia 03/23/2020  . Nausea vomiting and diarrhea 07/10/2017  . Non-intractable cyclical vomiting with nausea   . Nuclear sclerotic cataract of both eyes 04/20/2016  . OSA (obstructive sleep apnea) 07/10/2017  . OSA on CPAP 04/01/2013  . OSA treated with BiPAP   . Physical debility 06/23/2017  . 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 Pulmonary embolism ([place]) 03/2013  . Pulmonary HTN ([place]) 07/05/2017  . Seizure ([place]) 07/10/2017  . Seizure disorder ([place]) 07/05/2017  . Seizures ([place])   . Sepsis ([place]) 07/10/2017  . Sleep apnea   . Status epilepticus ([place])   . 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 Tachypnea   . Thyroid disease    Hypothyroid  . Tracheostomy, acute management ([place])   . Type II diabetes mellitus ([place])     Past Surgical History:  Procedure Laterality Date  . ABDOMINAL HYSTERECTOMY  1970s  . BREAST BIOPSY Left 2006  . BREAST LUMPECTOMY Left 2006  . BUNIONECTOMY WITH HAMMERTOE RECONSTRUCTION Left   . CARDIAC CATHETERIZATION  06/2020  . ESOPHAGOGASTRODUODENOSCOPY N/A 05/29/2017   Procedure: ESOPHAGOGASTRODUODENOSCOPY (EGD);  Surgeon: Irene Shipper, MD;  Location: [facility];  Service: Endoscopy;  Laterality: N/A;  . JOINT REPLACEMENT    . LAPAROSCOPIC CHOLECYSTECTOMY  ~ 2005  . Pulmonary function test  2-3 weeks ago  . RIGHT/LEFT HEART CATH AND CORONARY ANGIOGRAPHY N/A 05/24/2020   Procedure: RIGHT/LEFT HEART CATH AND CORONARY ANGIOGRAPHY;  Surgeon: Jolaine Artist, MD;   Location: [facility];  Service: Cardiovascular;  Laterality: N/A;  . TOTAL HIP ARTHROPLASTY Left 2010  . TOTAL KNEE ARTHROPLASTY Bilateral 2005-2006    There were no vitals filed for this visit.   Subjective Assessment - 11/01/20 1538    Subjective Patient has not been in to PT in about 4 weeks, this is due to the break in scheduling with her insurance coverage. She denies any falls, she reports off balance and her hands are purple today, O2 saturation is 90%    Currently in Pain? Yes    Pain Score 3     Pain Location Back    Pain Orientation Lower;Right              [facility] PT Assessment - 11/01/20 0001      Ambulation/Gait   Gait Comments able to walk 120' without rest took 1 minute 15 seconds., we did this a second time at the same speed  she struggled a little bit more the O2 dropped to 85%      [employer]   Sit to Stand Able to stand without using hands and stabilize independently    Standing Unsupported Able to stand safely 2 minutes    Sitting with Back Unsupported but Feet Supported on Floor or Stool Able to sit safely and securely 2 minutes    Stand to Sit Sits safely with minimal use of hands    Transfers Able to transfer safely, definite need of hands    Standing Unsupported with Eyes Closed Able to stand 10 seconds with supervision    Standing Unsupported with Feet Together Able to place feet together independently and stand 1 minute safely    From Standing, Reach Forward with Outstretched Arm Can reach confidently >25 cm (10")    From Standing Position, Pick up Object from Floor Able to pick up shoe safely and easily    From Standing Position, Turn to Look Behind Over each Shoulder Looks behind one side only/other side shows less weight shift    Turn 360 Degrees Able to turn 360 degrees safely but slowly    Standing Unsupported, Alternately Place Feet on Step/Stool Able to complete 4 steps without aid or supervision    Standing Unsupported, One Foot in  Front Needs help to step but can hold 15 seconds    Standing on One Leg Tries to lift leg/unable to hold 3 seconds but remains standing independently    Total Score 43      Timed Up and Go Test   Normal TUG (seconds) 27    TUG Comments LOB x 1 caught herself using the door                         [facility] Adult PT Treatment/Exercise - 11/01/20 0001  High Level Balance   High Level Balance Activities Side stepping;Backward walking;Negotiating over obstacles      Lumbar Exercises: Machines for Strengthening   Cybex Knee Extension 5# 3x10    Cybex Knee Flexion 20# 3x10                    PT Short Term Goals - 12/04/19 1627      PT SHORT TERM GOAL #1   Title independent with intial HEP    Status Achieved             PT Long Term Goals - 11/01/20 1714      PT LONG TERM GOAL #1   Title increase overall hip LE strength for functional gait and safety hips and ankles to 4+/5    Status On-going      PT LONG TERM GOAL #2   Title decrease TUG time to less than 14 seconds for functional gait and safety    Status Partially Met      PT LONG TERM GOAL #3   Title increase BERG to 50/56 for safety and independence    Status Partially Met      PT LONG TERM GOAL #4   Title decrease LBP 50% with activities    Status Achieved                 Plan - 11/01/20 1709    Clinical Impression Statement Patient ran out of the current certification of her insurance, we have not seen her in about a month, she has had some more testing done and some medications have been changed. She returns with better ability to tolerate walking, today 120 feet before needing to sit and rest, Her balance ont he [place] and the [place] did regress with her time away.  This puts her at a higher risk for falls.  She is on the 4Liters of O2 at all times, her MD wants [place] to keep oxygen levels at 88% and above, there was a few times today that the O2 went to 86% but with cues for the pursed  lip breathing it would return to 90+ % in less than 1 minute, once concern today is that her hands were purple in color, they have always been cold but the discoloration was much more pronounced    PT Next Visit Plan will resume and try to maximize her function    Consulted and Agree with Plan of Care Patient           Patient will benefit from skilled therapeutic intervention in order to improve the following deficits and impairments:  Abnormal gait,Decreased range of motion,Difficulty walking,Increased muscle spasms,Decreased activity tolerance,Pain,Decreased balance,Impaired flexibility,Improper body mechanics,Postural dysfunction,Decreased strength,Decreased mobility  Visit Diagnosis: Muscle weakness (generalized)  Risk for falls  Difficulty in walking, not elsewhere classified  Physical debility  Acute bilateral low back pain without sciatica  Muscle spasm of back     Problem List Patient Active Problem List   Diagnosis Date Noted  . Snoring 10/18/2020  . COPD (chronic obstructive pulmonary disease) ([place]) 07/26/2020  . Pulmonary nodule 07/26/2020  . Abnormal CT of the abdomen   . Seizures ([place])   . Hyperlipidemia   . GERD (gastroesophageal reflux disease)   . Diverticulosis   . Depression   . Complication of anesthesia   . Arthritis   . Hypoxia 03/23/2020  . Acute cystitis with hematuria 12/19/2019  . Hypomagnesemia 07/11/2017  . Hypokalemia 07/10/2017  . Chronic diastolic CHF (  congestive heart failure) ([place]) 07/10/2017  . Seizure ([place]) 07/10/2017  . OSA (obstructive sleep apnea) 07/10/2017  . Seizure disorder ([place]) 07/05/2017  . Pulmonary hypertension ([place]) 07/05/2017  . Anxiety state   . Physical debility 06/23/2017  . 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 History of acute respiratory failure   . Status epilepticus ([place])   . Acute blood loss anemia   . Chronic respiratory failure with hypoxia ([place])   . Acute encephalopathy 06/06/2017  . Diverticulitis  of large intestine without perforation or abscess without bleeding   . Non-intractable cyclical vomiting with nausea   . Hypocalcemia 05/27/2017  . 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 Colitis 05/27/2017  . Central retinal vein occlusion of right eye 04/20/2016  . Nuclear sclerotic cataract of both eyes 04/20/2016  . Diabetic foot infection ([place]) 07/21/2015  . Diabetes mellitus type 2, controlled ([place]) 07/21/2015  . Cellulitis of foot, right 07/21/2015  . Acute pulmonary embolism ([place]) 04/01/2013  . DVT (deep venous thrombosis) ([place]) 04/01/2013  . Concussion 04/01/2013  . Essential hypertension 04/01/2013  . Hypothyroidism 04/01/2013  . History of breast cancer 2006    [name] Boast., PT 11/01/2020, 5:14 PM  [place]. [place], [name], [address] Phone: [phone]   Fax:  [phone]  Name: Amanda Short MRN: [id number] Date of Birth: August 11, 1940

## 2020-11-01 NOTE — Addendum Note (Signed)
Encounter addended by: Jolaine Artist, MD on: 11/01/2020 2:07 PM  Actions taken: Clinical Note Signed

## 2020-11-01 NOTE — Telephone Encounter (Signed)
Sent in [employer] enrollment application to [employer] for [employer].   Audry Riles, PharmD, BCPS, BCCP, CPP Heart Failure Clinic Pharmacist [phone]

## 2020-11-03 ENCOUNTER — Ambulatory Visit: Payer: Medicare PPO | Admitting: Physical Therapy

## 2020-11-08 ENCOUNTER — Ambulatory Visit: Payer: Medicare PPO | Admitting: Physical Therapy

## 2020-11-08 ENCOUNTER — Encounter: Payer: Self-pay | Admitting: Physical Therapy

## 2020-11-08 ENCOUNTER — Other Ambulatory Visit: Payer: Self-pay

## 2020-11-08 DIAGNOSIS — M62830 Muscle spasm of back: Secondary | ICD-10-CM

## 2020-11-08 DIAGNOSIS — M6281 Muscle weakness (generalized): Secondary | ICD-10-CM | POA: Diagnosis not present

## 2020-11-08 DIAGNOSIS — M545 Low back pain, unspecified: Secondary | ICD-10-CM

## 2020-11-08 DIAGNOSIS — Z9181 History of falling: Secondary | ICD-10-CM

## 2020-11-08 DIAGNOSIS — R2689 Other abnormalities of gait and mobility: Secondary | ICD-10-CM

## 2020-11-08 DIAGNOSIS — R262 Difficulty in walking, not elsewhere classified: Secondary | ICD-10-CM

## 2020-11-08 DIAGNOSIS — R5381 Other malaise: Secondary | ICD-10-CM

## 2020-11-08 NOTE — Therapy (Signed)
[facility]. [place], [name], [address] Phone: [phone]   Fax:  [phone]  Physical Therapy Treatment  Patient Details  Name: Amanda Short MRN: [id number] Date of Birth: 1940/12/13 Referring Provider (PT): Rennard   Encounter Date: 11/08/2020   PT End of Session - 11/08/20 1605    Visit Number 68    Number of Visits 2    Date for PT Re-Evaluation 01/10/21    Authorization Type Humana    PT Start Time 1524    PT Stop Time 1610    PT Time Calculation (min) 46 min    Activity Tolerance Patient tolerated treatment well;Patient limited by fatigue    Behavior During Therapy [facility] for tasks assessed/performed           Past Medical History:  Diagnosis Date  . Abdominal pain   . Abnormal CT of the abdomen   . Acute blood loss anemia   . Acute cystitis with hematuria 12/19/2019  . Acute encephalopathy 06/06/2017  . Acute pulmonary embolism ([place]) 04/01/2013  . Acute respiratory failure with hypoxia ([place])   . Anxiety state   . Arthritis    "back, hands" (07/22/2015)  . Aspiration pneumonia ([place])   . Benign essential HTN   . Cancer of left breast ([place]) 2006   S/P lumpectomy  . Cellulitis of foot, right 07/21/2015  . Cellulitis of right foot 07/21/2015  . Central retinal vein occlusion of right eye 04/20/2016  . Chronic diastolic CHF (congestive heart failure) ([place]) 07/10/2017  . Colitis 05/27/2017  . Complication of anesthesia    "brief breathing problem at surgery center in ~ 2005 when I had gallbladder OR"  . Concussion 03/2013   due to fall  . Depression   . Diabetes ([place]) 04/01/2013  . 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 Diabetes mellitus type 2, controlled ([place]) 07/21/2015  . Diabetic foot infection ([place]) 07/21/2015  . Diverticulitis of colon   . Diverticulitis of large intestine without perforation or abscess without bleeding   . Diverticulosis   . DVT (deep venous thrombosis) ([place]) 03/2013    LLE  . Encephalopathy   . Encounter for attention to tracheostomy ([place])   . Encounter for central line placement   . Encounter for orogastric (OG) tube placement   . Essential hypertension, benign 04/01/2013  . GERD (gastroesophageal reflux disease)   . History of acute respiratory failure   . History of pulmonary embolism   . HX: breast cancer 04/01/2013  . Hyperlipidemia   . Hypertension   . Hypocalcemia 05/27/2017  . Hypokalemia 07/10/2017  . Hypomagnesemia 07/11/2017  . Hypothyroidism   . Hypoxia 03/23/2020  . Nausea vomiting and diarrhea 07/10/2017  . Non-intractable cyclical vomiting with nausea   . Nuclear sclerotic cataract of both eyes 04/20/2016  . OSA (obstructive sleep apnea) 07/10/2017  . OSA on CPAP 04/01/2013  . OSA treated with BiPAP   . Physical debility 06/23/2017  . 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 Pulmonary embolism ([place]) 03/2013  . Pulmonary HTN ([place]) 07/05/2017  . Seizure ([place]) 07/10/2017  . Seizure disorder ([place]) 07/05/2017  . Seizures ([place])   . Sepsis ([place]) 07/10/2017  . Sleep apnea   . Status epilepticus ([place])   . 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 Tachypnea   . Thyroid disease    Hypothyroid  . Tracheostomy, acute management ([place])   . Type II diabetes mellitus ([place])     Past Surgical History:  Procedure Laterality Date  . ABDOMINAL HYSTERECTOMY  1970s  . BREAST BIOPSY Left 2006  . BREAST LUMPECTOMY Left 2006  . BUNIONECTOMY WITH HAMMERTOE RECONSTRUCTION Left   . CARDIAC CATHETERIZATION  06/2020  . ESOPHAGOGASTRODUODENOSCOPY N/A 05/29/2017   Procedure: ESOPHAGOGASTRODUODENOSCOPY (EGD);  Surgeon: Irene Shipper, MD;  Location: [facility];  Service: Endoscopy;  Laterality: N/A;  . JOINT REPLACEMENT    . LAPAROSCOPIC CHOLECYSTECTOMY  ~ 2005  . Pulmonary function test  2-3 weeks ago  . RIGHT/LEFT HEART CATH AND CORONARY ANGIOGRAPHY N/A 05/24/2020   Procedure: RIGHT/LEFT HEART CATH AND CORONARY ANGIOGRAPHY;  Surgeon: Jolaine Artist, MD;   Location: [facility];  Service: Cardiovascular;  Laterality: N/A;  . TOTAL HIP ARTHROPLASTY Left 2010  . TOTAL KNEE ARTHROPLASTY Bilateral 2005-2006    There were no vitals filed for this visit.   Subjective Assessment - 11/08/20 1532    Subjective Patient had a fall last Tuesday night, she reports that it was about 9:30PM, she was getting ready for bed, she had been without oxygen for over 30 minutes.  She does not use an assistive device, she reports that her legs just gave out without any warning, no shortness of breath, she reports that prior to this she had some heaviness in her chest.  Reports that she has been sore in the knees and the back since then    Currently in Pain? Yes    Pain Score 3     Pain Location Back    Pain Orientation Lower                             [facility] Adult PT Treatment/Exercise - 11/08/20 0001      Ambulation/Gait   Gait Comments HHA, 3x 120 feet      Lumbar Exercises: Stretches   Gastroc Stretch Right;4 reps;10 seconds      Lumbar Exercises: Aerobic   Nustep Level 5 x 6 minutes cues to stay at 60 steps per minute      Lumbar Exercises: Standing   Row Both;Theraband;20 reps    Theraband Level (Row) Level 2 (Red)    Row Limitations on airex    Shoulder Extension Both;20 reps;Theraband    Theraband Level (Shoulder Extension) Level 2 (Red)    Shoulder Extension Limitations on airex      Lumbar Exercises: Seated   Long Arc Quad on Chair Both;2 sets;10 reps    LAQ on Chair Weights (lbs) 3#    Other Seated Lumbar Exercises marching 3# 2x10 each, 2# bicep curls    Other Seated Lumbar Exercises red tband knee flexion 2x10, red tband ankle exercises                    PT Short Term Goals - 12/04/19 1627      PT SHORT TERM GOAL #1   Title independent with intial HEP    Status Achieved             PT Long Term Goals - 11/01/20 1714      PT LONG TERM GOAL #1   Title increase overall hip LE strength for  functional gait and safety hips and ankles to 4+/5    Status On-going      PT LONG TERM GOAL #2   Title decrease TUG time to less than 14 seconds for functional gait and safety    Status Partially Met  PT LONG TERM GOAL #3   Title increase BERG to 50/56 for safety and independence    Status Partially Met      PT LONG TERM GOAL #4   Title decrease LBP 50% with activities    Status Achieved                 Plan - 11/08/20 1606    Clinical Impression Statement Patient has had a fall last week, reports that her knees just gave out, she is unsure of a specific cause, reports that she was getting ready for bed and had been without O2.  She reports that she has had knee and back soreness since then, reports overall doing well, all exercises today with her on 4L O2.  She had her O2 saturation above 90% everytime I checked it.  She was very fatigued with walking but was able to perform with a short rest break during the walks    PT Next Visit Plan will resume and try to maximize her function    Consulted and Agree with Plan of Care Patient           Patient will benefit from skilled therapeutic intervention in order to improve the following deficits and impairments:  Abnormal gait,Decreased range of motion,Difficulty walking,Increased muscle spasms,Decreased activity tolerance,Pain,Decreased balance,Impaired flexibility,Improper body mechanics,Postural dysfunction,Decreased strength,Decreased mobility  Visit Diagnosis: Muscle weakness (generalized)  Risk for falls  Difficulty in walking, not elsewhere classified  Physical debility  Acute bilateral low back pain without sciatica  Muscle spasm of back  Other abnormalities of gait and mobility     Problem List Patient Active Problem List   Diagnosis Date Noted  . Snoring 10/18/2020  . COPD (chronic obstructive pulmonary disease) ([place]) 07/26/2020  . Pulmonary nodule 07/26/2020  . Abnormal CT of the abdomen   .  Seizures ([place])   . Hyperlipidemia   . GERD (gastroesophageal reflux disease)   . Diverticulosis   . Depression   . Complication of anesthesia   . Arthritis   . Hypoxia 03/23/2020  . Acute cystitis with hematuria 12/19/2019  . Hypomagnesemia 07/11/2017  . Hypokalemia 07/10/2017  . Chronic diastolic CHF (congestive heart failure) ([place]) 07/10/2017  . Seizure ([place]) 07/10/2017  . OSA (obstructive sleep apnea) 07/10/2017  . Seizure disorder ([place]) 07/05/2017  . Pulmonary hypertension ([place]) 07/05/2017  . Anxiety state   . Physical debility 06/23/2017  . 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 History of acute respiratory failure   . Status epilepticus ([place])   . Acute blood loss anemia   . Chronic respiratory failure with hypoxia ([place])   . Acute encephalopathy 06/06/2017  . Diverticulitis of large intestine without perforation or abscess without bleeding   . Non-intractable cyclical vomiting with nausea   . Hypocalcemia 05/27/2017  . 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 Colitis 05/27/2017  . Central retinal vein occlusion of right eye 04/20/2016  . Nuclear sclerotic cataract of both eyes 04/20/2016  . Diabetic foot infection ([place]) 07/21/2015  . Diabetes mellitus type 2, controlled ([place]) 07/21/2015  . Cellulitis of foot, right 07/21/2015  . Acute pulmonary embolism ([place]) 04/01/2013  . DVT (deep venous thrombosis) ([place]) 04/01/2013  . Concussion 04/01/2013  . Essential hypertension 04/01/2013  . Hypothyroidism 04/01/2013  . History of breast cancer 2006    [name] Boast., PT 11/08/2020, 4:37 PM  [place]. [place], [name], [address] Phone: [phone]   Fax:  [phone]  Name: Amanda Short  MRN: [id number] Date of Birth: [birth year]

## 2020-11-08 NOTE — Telephone Encounter (Signed)
Patient Advocate Encounter   Received notification from [facility] that precertification for Tyvaso is required.   PA submitted via fax Status is pending   Will continue to follow.   Audry Riles, PharmD, BCPS, BCCP, CPP Heart Failure Clinic Pharmacist [phone]

## 2020-11-10 ENCOUNTER — Ambulatory Visit: Payer: Medicare PPO | Admitting: Physical Therapy

## 2020-11-10 ENCOUNTER — Telehealth (HOSPITAL_COMMUNITY): Payer: Self-pay | Admitting: Pharmacy Technician

## 2020-11-10 ENCOUNTER — Encounter (HOSPITAL_COMMUNITY): Payer: Self-pay

## 2020-11-10 ENCOUNTER — Encounter: Payer: Self-pay | Admitting: Physical Therapy

## 2020-11-10 ENCOUNTER — Other Ambulatory Visit: Payer: Self-pay

## 2020-11-10 DIAGNOSIS — M545 Low back pain, unspecified: Secondary | ICD-10-CM

## 2020-11-10 DIAGNOSIS — Z9181 History of falling: Secondary | ICD-10-CM

## 2020-11-10 DIAGNOSIS — M6281 Muscle weakness (generalized): Secondary | ICD-10-CM

## 2020-11-10 DIAGNOSIS — R262 Difficulty in walking, not elsewhere classified: Secondary | ICD-10-CM

## 2020-11-10 DIAGNOSIS — R5381 Other malaise: Secondary | ICD-10-CM

## 2020-11-10 NOTE — Telephone Encounter (Addendum)
Advanced Heart Failure Patient Advocate Encounter  Prior Authorization for [employer] Kit has been approved.    PA# [id number] Effective dates: 11/10/20 through 07/16/21  Sent PA approval to [facility].

## 2020-11-10 NOTE — Therapy (Signed)
[place]. [place], [name], [address] Phone: [phone]   Fax:  [phone]  Physical Therapy Treatment  Patient Details  Name: Amanda Short MRN: [id number] Date of Birth: [birth year] Referring Provider (PT): Rennard   Encounter Date: 11/10/2020   PT End of Session - 11/10/20 1508    Visit Number 17    Date for PT Re-Evaluation 01/10/21    Authorization Type [employer]    PT Start Time 1351    PT Stop Time 1433    PT Time Calculation (min) 42 min    Activity Tolerance Patient tolerated treatment well;Patient limited by fatigue    Behavior During Therapy [facility] for tasks assessed/performed           Past Medical History:  Diagnosis Date  . Abdominal pain   . Abnormal CT of the abdomen   . Acute blood loss anemia   . Acute cystitis with hematuria 12/19/2019  . Acute encephalopathy 06/06/2017  . Acute pulmonary embolism ([place]) 04/01/2013  . Acute respiratory failure with hypoxia ([place])   . Anxiety state   . Arthritis    "back, hands" (07/22/2015)  . Aspiration pneumonia ([place])   . Benign essential HTN   . Cancer of left breast ([place]) 2006   S/P lumpectomy  . Cellulitis of foot, right 07/21/2015  . Cellulitis of right foot 07/21/2015  . Central retinal vein occlusion of right eye 04/20/2016  . Chronic diastolic CHF (congestive heart failure) ([place]) 07/10/2017  . Colitis 05/27/2017  . Complication of anesthesia    "brief breathing problem at surgery center in ~ 2005 when I had gallbladder OR"  . Concussion 03/2013   due to fall  . Depression   . Diabetes ([place]) 04/01/2013  . 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 Diabetes mellitus type 2, controlled ([place]) 07/21/2015  . Diabetic foot infection ([place]) 07/21/2015  . Diverticulitis of colon   . Diverticulitis of large intestine without perforation or abscess without bleeding   . Diverticulosis   . DVT (deep venous thrombosis) ([place]) 03/2013   LLE  . Encephalopathy    . Encounter for attention to tracheostomy ([place])   . Encounter for central line placement   . Encounter for orogastric (OG) tube placement   . Essential hypertension, benign 04/01/2013  . GERD (gastroesophageal reflux disease)   . History of acute respiratory failure   . History of pulmonary embolism   . HX: breast cancer 04/01/2013  . Hyperlipidemia   . Hypertension   . Hypocalcemia 05/27/2017  . Hypokalemia 07/10/2017  . Hypomagnesemia 07/11/2017  . Hypothyroidism   . Hypoxia 03/23/2020  . Nausea vomiting and diarrhea 07/10/2017  . Non-intractable cyclical vomiting with nausea   . Nuclear sclerotic cataract of both eyes 04/20/2016  . OSA (obstructive sleep apnea) 07/10/2017  . OSA on CPAP 04/01/2013  . OSA treated with BiPAP   . Physical debility 06/23/2017  . 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 Pulmonary embolism ([place]) 03/2013  . Pulmonary HTN ([place]) 07/05/2017  . Seizure ([place]) 07/10/2017  . Seizure disorder ([place]) 07/05/2017  . Seizures ([place])   . Sepsis ([place]) 07/10/2017  . Sleep apnea   . Status epilepticus ([place])   . 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 Tachypnea   . Thyroid disease    Hypothyroid  . Tracheostomy, acute management ([place])   . Type II diabetes mellitus ([place])     Past Surgical History:  Procedure Laterality Date  . ABDOMINAL  HYSTERECTOMY  1970s  . BREAST BIOPSY Left 2006  . BREAST LUMPECTOMY Left 2006  . BUNIONECTOMY WITH HAMMERTOE RECONSTRUCTION Left   . CARDIAC CATHETERIZATION  06/2020  . ESOPHAGOGASTRODUODENOSCOPY N/A 05/29/2017   Procedure: ESOPHAGOGASTRODUODENOSCOPY (EGD);  Surgeon: Irene Shipper, MD;  Location: [facility];  Service: Endoscopy;  Laterality: N/A;  . JOINT REPLACEMENT    . LAPAROSCOPIC CHOLECYSTECTOMY  ~ 2005  . Pulmonary function test  2-3 weeks ago  . RIGHT/LEFT HEART CATH AND CORONARY ANGIOGRAPHY N/A 05/24/2020   Procedure: RIGHT/LEFT HEART CATH AND CORONARY ANGIOGRAPHY;  Surgeon: Jolaine Artist, MD;  Location: [facility];  Service: Cardiovascular;  Laterality: N/A;  . TOTAL HIP ARTHROPLASTY Left 2010  . TOTAL KNEE ARTHROPLASTY Bilateral 2005-2006    There were no vitals filed for this visit.   Subjective Assessment - 11/10/20 1355    Subjective I have not had any more falls, just still recovering    Currently in Pain? Yes    Pain Score 3     Pain Location Back    Pain Orientation Lower    Pain Descriptors / Indicators Aching;Tightness;Sore                             [facility] Adult PT Treatment/Exercise - 11/10/20 0001      Ambulation/Gait   Gait Comments gait with light HHA 150'x1, 110'x1 and 100'x 1      Lumbar Exercises: Machines for Strengthening   Cybex Knee Extension 5# 3x10    Cybex Knee Flexion 25# 3x10      Lumbar Exercises: Seated   Long Arc Quad on Chair Both;2 sets;10 reps    LAQ on Chair Weights (lbs) 3#    Other Seated Lumbar Exercises marching 3# 2x10 each, 3# bicep curls                    PT Short Term Goals - 12/04/19 1627      PT SHORT TERM GOAL #1   Title independent with intial HEP    Status Achieved             PT Long Term Goals - 11/10/20 1510      PT LONG TERM GOAL #1   Title increase overall hip LE strength for functional gait and safety hips and ankles to 4+/5    Status On-going      PT LONG TERM GOAL #2   Title decrease TUG time to less than 14 seconds for functional gait and safety    Status Partially Met      PT LONG TERM GOAL #3   Title increase BERG to 50/56 for safety and independence    Status Partially Met                 Plan - 11/10/20 1508    Clinical Impression Statement O2 saturation stayed above 90% during session today.  I asked her about her biggest issues at home balance vs fatigue, she reported fatigue.  She has been able to do better with our walks with less breaks but still limited due to the fatigue and typically the distance will decrease with every walk.  She remains unsteady with times  of being off balance and requiringmod A to right    PT Next Visit Plan will resume and try to maximize her function    Consulted and Agree with Plan of Care Patient  Patient will benefit from skilled therapeutic intervention in order to improve the following deficits and impairments:  Abnormal gait,Decreased range of motion,Difficulty walking,Increased muscle spasms,Decreased activity tolerance,Pain,Decreased balance,Impaired flexibility,Improper body mechanics,Postural dysfunction,Decreased strength,Decreased mobility  Visit Diagnosis: Muscle weakness (generalized)  Risk for falls  Difficulty in walking, not elsewhere classified  Physical debility  Acute bilateral low back pain without sciatica     Problem List Patient Active Problem List   Diagnosis Date Noted  . Snoring 10/18/2020  . COPD (chronic obstructive pulmonary disease) ([place]) 07/26/2020  . Pulmonary nodule 07/26/2020  . Abnormal CT of the abdomen   . Seizures ([place])   . Hyperlipidemia   . GERD (gastroesophageal reflux disease)   . Diverticulosis   . Depression   . Complication of anesthesia   . Arthritis   . Hypoxia 03/23/2020  . Acute cystitis with hematuria 12/19/2019  . Hypomagnesemia 07/11/2017  . Hypokalemia 07/10/2017  . Chronic diastolic CHF (congestive heart failure) ([place]) 07/10/2017  . Seizure ([place]) 07/10/2017  . OSA (obstructive sleep apnea) 07/10/2017  . Seizure disorder ([place]) 07/05/2017  . Pulmonary hypertension ([place]) 07/05/2017  . Anxiety state   . Physical debility 06/23/2017  . 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 History of acute respiratory failure   . Status epilepticus ([place])   . Acute blood loss anemia   . Chronic respiratory failure with hypoxia ([place])   . Acute encephalopathy 06/06/2017  . Diverticulitis of large intestine without perforation or abscess without bleeding   . Non-intractable cyclical vomiting with nausea   . Hypocalcemia 05/27/2017  . Prolonged QT  interval 05/27/2017  . Colitis 05/27/2017  . Central retinal vein occlusion of right eye 04/20/2016  . Nuclear sclerotic cataract of both eyes 04/20/2016  . Diabetic foot infection ([place]) 07/21/2015  . Diabetes mellitus type 2, controlled ([place]) 07/21/2015  . Cellulitis of foot, right 07/21/2015  . Acute pulmonary embolism ([place]) 04/01/2013  . DVT (deep venous thrombosis) ([place]) 04/01/2013  . Concussion 04/01/2013  . Essential hypertension 04/01/2013  . Hypothyroidism 04/01/2013  . History of breast cancer 2006    [name] Boast., PT 11/10/2020, 3:11 PM  [place]. [place], [name], [address] Phone: [phone]   Fax:  [phone]  Name: Amanda Short MRN: [id number] Date of Birth: 07-23-1940

## 2020-11-10 NOTE — Telephone Encounter (Signed)
Advanced Heart Failure Patient Advocate Encounter  Pre-certification for Amanda Short has been approved. Approval sent to [place].   PA# [id number] Effective dates: 07/17/20 through 07/16/21   Audry Riles, PharmD, BCPS, BCCP, CPP Heart Failure Clinic Pharmacist [phone]

## 2020-11-11 ENCOUNTER — Telehealth (HOSPITAL_COMMUNITY): Payer: Self-pay | Admitting: Pharmacy Technician

## 2020-11-11 NOTE — Telephone Encounter (Signed)
Advanced Heart Failure Patient Advocate Encounter  Received a message that the requested Tyvaso starter kit needs to be authorized thorough the patient's part B insurance.   Sent in [place] via fax.

## 2020-11-12 NOTE — Telephone Encounter (Signed)
Called patient. Scheduled patient for appointment. [place]. Offered her 11/23/20 she could not do this day. So she was scheduled for 11/30/20. Offered sooner appointment in [place] this is too far for her. She will call if things change or get worse between now and appointment. No further questions.

## 2020-11-15 ENCOUNTER — Ambulatory Visit: Payer: Medicare PPO | Admitting: Physical Therapy

## 2020-11-17 ENCOUNTER — Ambulatory Visit: Payer: Medicare PPO | Admitting: Physical Therapy

## 2020-11-20 ENCOUNTER — Encounter (HOSPITAL_COMMUNITY): Payer: Self-pay

## 2020-11-22 ENCOUNTER — Other Ambulatory Visit: Payer: Self-pay

## 2020-11-22 ENCOUNTER — Encounter: Payer: Self-pay | Admitting: Physical Therapy

## 2020-11-22 ENCOUNTER — Ambulatory Visit: Payer: Medicare PPO | Attending: Family Medicine | Admitting: Physical Therapy

## 2020-11-22 DIAGNOSIS — M545 Low back pain, unspecified: Secondary | ICD-10-CM

## 2020-11-22 DIAGNOSIS — R2689 Other abnormalities of gait and mobility: Secondary | ICD-10-CM | POA: Diagnosis present

## 2020-11-22 DIAGNOSIS — M6281 Muscle weakness (generalized): Secondary | ICD-10-CM | POA: Insufficient documentation

## 2020-11-22 DIAGNOSIS — Z9181 History of falling: Secondary | ICD-10-CM | POA: Diagnosis present

## 2020-11-22 DIAGNOSIS — R262 Difficulty in walking, not elsewhere classified: Secondary | ICD-10-CM | POA: Diagnosis present

## 2020-11-22 DIAGNOSIS — R5381 Other malaise: Secondary | ICD-10-CM

## 2020-11-22 DIAGNOSIS — M62830 Muscle spasm of back: Secondary | ICD-10-CM | POA: Diagnosis present

## 2020-11-22 NOTE — Therapy (Signed)
[facility]. [place], [name], [address] Phone: [phone]   Fax:  [phone]  Physical Therapy Treatment  Patient Details  Name: Amanda Short MRN: [id number] Date of Birth: 06/06/1941 No data recorded  Encounter Date: 11/22/2020   PT End of Session - 11/22/20 1444    Visit Number 65    Number of Visits 53    Date for PT Re-Evaluation 01/10/21    Authorization Type [employer]    PT Start Time 1352    PT Stop Time 1443    PT Time Calculation (min) 51 min    Activity Tolerance Patient tolerated treatment well;Patient limited by fatigue    Behavior During Therapy [facility] for tasks assessed/performed           Past Medical History:  Diagnosis Date  . Abdominal pain   . Abnormal CT of the abdomen   . Acute blood loss anemia   . Acute cystitis with hematuria 12/19/2019  . Acute encephalopathy 06/06/2017  . Acute pulmonary embolism ([place]) 04/01/2013  . Acute respiratory failure with hypoxia ([place])   . Anxiety state   . Arthritis    "back, hands" (07/22/2015)  . Aspiration pneumonia ([place])   . Benign essential HTN   . Cancer of left breast ([place]) 2006   S/P lumpectomy  . Cellulitis of foot, right 07/21/2015  . Cellulitis of right foot 07/21/2015  . Central retinal vein occlusion of right eye 04/20/2016  . Chronic diastolic CHF (congestive heart failure) ([place]) 07/10/2017  . Colitis 05/27/2017  . Complication of anesthesia    "brief breathing problem at surgery center in ~ 2005 when I had gallbladder OR"  . Concussion 03/2013   due to fall  . Depression   . Diabetes ([place]) 04/01/2013  . 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 Diabetes mellitus type 2, controlled ([place]) 07/21/2015  . Diabetic foot infection ([place]) 07/21/2015  . Diverticulitis of colon   . Diverticulitis of large intestine without perforation or abscess without bleeding   . Diverticulosis   . DVT (deep venous thrombosis) ([place]) 03/2013   LLE  .  Encephalopathy   . Encounter for attention to tracheostomy ([place])   . Encounter for central line placement   . Encounter for orogastric (OG) tube placement   . Essential hypertension, benign 04/01/2013  . GERD (gastroesophageal reflux disease)   . History of acute respiratory failure   . History of pulmonary embolism   . HX: breast cancer 04/01/2013  . Hyperlipidemia   . Hypertension   . Hypocalcemia 05/27/2017  . Hypokalemia 07/10/2017  . Hypomagnesemia 07/11/2017  . Hypothyroidism   . Hypoxia 03/23/2020  . Nausea vomiting and diarrhea 07/10/2017  . Non-intractable cyclical vomiting with nausea   . Nuclear sclerotic cataract of both eyes 04/20/2016  . OSA (obstructive sleep apnea) 07/10/2017  . OSA on CPAP 04/01/2013  . OSA treated with BiPAP   . Physical debility 06/23/2017  . 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 Pulmonary embolism ([place]) 03/2013  . Pulmonary HTN ([place]) 07/05/2017  . Seizure ([place]) 07/10/2017  . Seizure disorder ([place]) 07/05/2017  . Seizures ([place])   . Sepsis ([place]) 07/10/2017  . Sleep apnea   . Status epilepticus ([place])   . 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 Tachypnea   . Thyroid disease    Hypothyroid  . Tracheostomy, acute management ([place])   . Type II diabetes mellitus ([place])     Past Surgical History:  Procedure  Laterality Date  . ABDOMINAL HYSTERECTOMY  1970s  . BREAST BIOPSY Left 2006  . BREAST LUMPECTOMY Left 2006  . BUNIONECTOMY WITH HAMMERTOE RECONSTRUCTION Left   . CARDIAC CATHETERIZATION  06/2020  . ESOPHAGOGASTRODUODENOSCOPY N/A 05/29/2017   Procedure: ESOPHAGOGASTRODUODENOSCOPY (EGD);  Surgeon: Irene Shipper, MD;  Location: [facility];  Service: Endoscopy;  Laterality: N/A;  . JOINT REPLACEMENT    . LAPAROSCOPIC CHOLECYSTECTOMY  ~ 2005  . Pulmonary function test  2-3 weeks ago  . RIGHT/LEFT HEART CATH AND CORONARY ANGIOGRAPHY N/A 05/24/2020   Procedure: RIGHT/LEFT HEART CATH AND CORONARY ANGIOGRAPHY;  Surgeon: Jolaine Artist, MD;  Location:  [facility];  Service: Cardiovascular;  Laterality: N/A;  . TOTAL HIP ARTHROPLASTY Left 2010  . TOTAL KNEE ARTHROPLASTY Bilateral 2005-2006    There were no vitals filed for this visit.   Subjective Assessment - 11/22/20 1357    Subjective Patient has not been to PT in about 12 days, she reports that she has been feeling bad with her breathing, reports that she has been having to be on the phone a lot trying to get a new medication set up.  Looking to take the first dose on Friday but has a nurse coming out this week to instruct her in how to use it    Currently in Pain? Yes    Pain Score 7     Pain Location Back    Pain Orientation Lower    Aggravating Factors  unsure of why the increae of low back pain over the past week              [facility] PT Assessment - 11/22/20 0001      Timed Up and Go Test   Normal TUG (seconds) 21                         [facility] Adult PT Treatment/Exercise - 11/22/20 0001      Ambulation/Gait   Gait Comments gait up and down stairs very slow and short of breath, gait x 140 feet in 2 minutes 12 seconds then needed to stop due to fatigue, then gait x 120 feet to the care      High Level Balance   High Level Balance Activities Side stepping;Negotitating around obstacles;Negotiating over obstacles    High Level Balance Comments 4" toe tocuhes      Lumbar Exercises: Aerobic   Nustep Level 5 x 7 minutes cues to stay at 60 steps per minute      Lumbar Exercises: Seated   Long Arc Quad on Chair Both;2 sets;10 reps    LAQ on Chair Weights (lbs) 3#    Other Seated Lumbar Exercises marching 3# 2x10 each, 3# bicep curls    Other Seated Lumbar Exercises red tband knee flexion 2x10, red tband ankle exercises                    PT Short Term Goals - 12/04/19 1627      PT SHORT TERM GOAL #1   Title independent with intial HEP    Status Achieved             PT Long Term Goals - 11/22/20 1446      PT LONG TERM GOAL #1    Title increase overall hip LE strength for functional gait and safety hips and ankles to 4+/5    Status On-going      PT LONG TERM GOAL #  2   Title decrease TUG time to less than 14 seconds for functional gait and safety    Status Partially Met      PT LONG TERM GOAL #3   Title increase BERG to 50/56 for safety and independence    Status Partially Met                 Plan - 11/22/20 1445    Clinical Impression Statement Patiet nwas absent over the past week, she reports trying to get the new breathing medication, will try to have that set up on Friday.  She comes in with more back pain, she is more unsteady on her feet, requiring more times with HHA, I did test her TUG and it improved > 6 seconds.    PT Next Visit Plan will resume and try to maximize her function    Consulted and Agree with Plan of Care Patient           Patient will benefit from skilled therapeutic intervention in order to improve the following deficits and impairments:  Abnormal gait,Decreased range of motion,Difficulty walking,Increased muscle spasms,Decreased activity tolerance,Pain,Decreased balance,Impaired flexibility,Improper body mechanics,Postural dysfunction,Decreased strength,Decreased mobility  Visit Diagnosis: Muscle weakness (generalized)  Risk for falls  Difficulty in walking, not elsewhere classified  Physical debility  Acute bilateral low back pain without sciatica  Muscle spasm of back     Problem List Patient Active Problem List   Diagnosis Date Noted  . Snoring 10/18/2020  . COPD (chronic obstructive pulmonary disease) ([place]) 07/26/2020  . Pulmonary nodule 07/26/2020  . Abnormal CT of the abdomen   . Seizures ([place])   . Hyperlipidemia   . GERD (gastroesophageal reflux disease)   . Diverticulosis   . Depression   . Complication of anesthesia   . Arthritis   . Hypoxia 03/23/2020  . Acute cystitis with hematuria 12/19/2019  . Hypomagnesemia 07/11/2017  . Hypokalemia  07/10/2017  . Chronic diastolic CHF (congestive heart failure) ([place]) 07/10/2017  . Seizure ([place]) 07/10/2017  . OSA (obstructive sleep apnea) 07/10/2017  . Seizure disorder ([place]) 07/05/2017  . Pulmonary hypertension ([place]) 07/05/2017  . Anxiety state   . Physical debility 06/23/2017  . 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 History of acute respiratory failure   . Status epilepticus ([place])   . Acute blood loss anemia   . Chronic respiratory failure with hypoxia ([place])   . Acute encephalopathy 06/06/2017  . Diverticulitis of large intestine without perforation or abscess without bleeding   . Non-intractable cyclical vomiting with nausea   . Hypocalcemia 05/27/2017  . 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 Colitis 05/27/2017  . Central retinal vein occlusion of right eye 04/20/2016  . Nuclear sclerotic cataract of both eyes 04/20/2016  . Diabetic foot infection ([place]) 07/21/2015  . Diabetes mellitus type 2, controlled ([place]) 07/21/2015  . Cellulitis of foot, right 07/21/2015  . Acute pulmonary embolism ([place]) 04/01/2013  . DVT (deep venous thrombosis) ([place]) 04/01/2013  . Concussion 04/01/2013  . Essential hypertension 04/01/2013  . Hypothyroidism 04/01/2013  . History of breast cancer 2006    [name] Boast., PT 11/22/2020, 2:47 PM  [place]. [place], [name], [address] Phone: [phone]   Fax:  [phone]  Name: Amanda Short MRN: [id number] Date of Birth: 1940/09/07

## 2020-11-24 ENCOUNTER — Ambulatory Visit: Payer: Medicare PPO | Admitting: Physical Therapy

## 2020-11-25 DIAGNOSIS — G4730 Sleep apnea, unspecified: Secondary | ICD-10-CM | POA: Insufficient documentation

## 2020-11-25 DIAGNOSIS — Z86711 Personal history of pulmonary embolism: Secondary | ICD-10-CM | POA: Insufficient documentation

## 2020-11-25 DIAGNOSIS — Z430 Encounter for attention to tracheostomy: Secondary | ICD-10-CM | POA: Insufficient documentation

## 2020-11-25 DIAGNOSIS — Z452 Encounter for adjustment and management of vascular access device: Secondary | ICD-10-CM | POA: Insufficient documentation

## 2020-11-25 DIAGNOSIS — G9340 Encephalopathy, unspecified: Secondary | ICD-10-CM | POA: Insufficient documentation

## 2020-11-25 DIAGNOSIS — J690 Pneumonitis due to inhalation of food and vomit: Secondary | ICD-10-CM | POA: Insufficient documentation

## 2020-11-25 DIAGNOSIS — E079 Disorder of thyroid, unspecified: Secondary | ICD-10-CM | POA: Insufficient documentation

## 2020-11-25 DIAGNOSIS — E119 Type 2 diabetes mellitus without complications: Secondary | ICD-10-CM | POA: Insufficient documentation

## 2020-11-25 DIAGNOSIS — Z4659 Encounter for fitting and adjustment of other gastrointestinal appliance and device: Secondary | ICD-10-CM | POA: Insufficient documentation

## 2020-11-25 DIAGNOSIS — I10 Essential (primary) hypertension: Secondary | ICD-10-CM | POA: Insufficient documentation

## 2020-11-25 DIAGNOSIS — J9601 Acute respiratory failure with hypoxia: Secondary | ICD-10-CM | POA: Insufficient documentation

## 2020-11-25 DIAGNOSIS — R0682 Tachypnea, not elsewhere classified: Secondary | ICD-10-CM | POA: Insufficient documentation

## 2020-11-25 DIAGNOSIS — K5732 Diverticulitis of large intestine without perforation or abscess without bleeding: Secondary | ICD-10-CM | POA: Insufficient documentation

## 2020-11-25 DIAGNOSIS — R109 Unspecified abdominal pain: Secondary | ICD-10-CM | POA: Insufficient documentation

## 2020-11-25 DIAGNOSIS — G4733 Obstructive sleep apnea (adult) (pediatric): Secondary | ICD-10-CM | POA: Insufficient documentation

## 2020-11-29 ENCOUNTER — Ambulatory Visit: Payer: Medicare PPO | Admitting: Physical Therapy

## 2020-11-30 ENCOUNTER — Ambulatory Visit: Payer: Medicare PPO | Admitting: Cardiology

## 2020-11-30 ENCOUNTER — Other Ambulatory Visit: Payer: Self-pay

## 2020-11-30 ENCOUNTER — Encounter: Payer: Self-pay | Admitting: Cardiology

## 2020-11-30 VITALS — BP 108/62 | HR 60 | Ht 61.0 in | Wt 147.0 lb

## 2020-11-30 DIAGNOSIS — I10 Essential (primary) hypertension: Secondary | ICD-10-CM

## 2020-11-30 DIAGNOSIS — G4733 Obstructive sleep apnea (adult) (pediatric): Secondary | ICD-10-CM

## 2020-11-30 DIAGNOSIS — R0789 Other chest pain: Secondary | ICD-10-CM

## 2020-11-30 DIAGNOSIS — I272 Pulmonary hypertension, unspecified: Secondary | ICD-10-CM

## 2020-11-30 NOTE — Patient Instructions (Signed)
Medication Instructions:  Your physician recommends that you continue on your current medications as directed. Please refer to the Current Medication list given to you today.  *If you need a refill on your cardiac medications before your next appointment, please call your pharmacy*   Lab Work: None If you have labs (blood work) drawn today and your tests are completely normal, you will receive your results only by: Marland Kitchen MyChart Message (if you have MyChart) OR . A paper copy in the mail If you have any lab test that is abnormal or we need to change your treatment, we will call you to review the results.   Testing/Procedures: None   Follow-Up: At [facility], you and your health needs are our priority.  As part of our continuing mission to provide you with exceptional heart care, we have created designated Provider Care Teams.  These Care Teams include your primary Cardiologist (physician) and Advanced Practice Providers (APPs -  Physician Assistants and Nurse Practitioners) who all work together to provide you with the care you need, when you need it.  We recommend signing up for the patient portal called "MyChart".  Sign up information is provided on this After Visit Summary.  MyChart is used to connect with patients for Virtual Visits (Telemedicine).  Patients are able to view lab/test results, encounter notes, upcoming appointments, etc.  Non-urgent messages can be sent to your provider as well.   To learn more about what you can do with MyChart, go to [email or web address].    Your next appointment:   4 month(s)  The format for your next appointment:   In Person  Provider:   Jenne Campus, MD   Other Instructions

## 2020-11-30 NOTE — Progress Notes (Signed)
Cardiology Office Note:    Date:  11/30/2020   ID:  Amanda Short, DOB 1940-11-25, MRN [id number]  PCP:  Haydee Salter, MD  Cardiologist:  Jenne Campus, MD    Referring MD: Haydee Salter, MD   Chief Complaint  Patient presents with  . Chest Pain  . leg unstable    History of Present Illness:    Amanda Short is a 80 y.o. female with past medical history significant for type 2 diabetes, essential hypertension, obstructive sleep apnea, ex smoker, history of DVT PE in 2014, history of chronic respiratory failure, severe pulmonary hypertension which is probably Group 1 with group 3 pulmonary arterial hypertension.  She requested to be seen because couple days ago she went to the restroom without oxygen she was brushing her teeth and started having some chest pain.  Then she started walking to the room and then she became very weak to the point that she went to her knees.  She did not passed out pain lasted maybe for a minute or so since that time there is no point more pain.  She is fairly sedentary.  She does do some exercises however any effort will bring shortness of breath.  Even folding clothes after laundry will bring short of breath she understand this and she is fine with it.  She is very grateful for the care she is getting.  Past Medical History:  Diagnosis Date  . Abdominal pain   . Abnormal CT of the abdomen   . Acute blood loss anemia   . Acute cystitis with hematuria 12/19/2019  . Acute encephalopathy 06/06/2017  . Acute pulmonary embolism ([place]) 04/01/2013  . Acute respiratory failure with hypoxia ([place])   . Anxiety state   . Arthritis    "back, hands" (07/22/2015)  . Aspiration pneumonia ([place])   . Benign essential HTN   . Cancer of left breast ([place]) 2006   S/P lumpectomy  . Cellulitis of foot, right 07/21/2015  . Cellulitis of right foot 07/21/2015  . Central retinal vein occlusion of right eye 04/20/2016  . Chronic diastolic CHF (congestive heart failure) ([place])  07/10/2017  . Chronic respiratory failure with hypoxia ([place])   . Colitis 05/27/2017  . Complication of anesthesia    "brief breathing problem at surgery center in ~ 2005 when I had gallbladder OR"  . Concussion 03/2013   due to fall  . Depression   . Diabetes ([place]) 04/01/2013  . 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 Diabetes mellitus type 2, controlled ([place]) 07/21/2015  . Diabetic foot infection ([place]) 07/21/2015  . Diverticulitis of colon   . Diverticulitis of large intestine without perforation or abscess without bleeding   . Diverticulosis   . DVT (deep venous thrombosis) ([place]) 03/2013   LLE  . Encephalopathy   . Encounter for attention to tracheostomy ([place])   . Encounter for central line placement   . Encounter for orogastric (OG) tube placement   . Essential hypertension 04/01/2013  . Essential hypertension, benign 04/01/2013  . GERD (gastroesophageal reflux disease)   . History of acute respiratory failure   . History of breast cancer 2006   S/P lumpectomy  . History of pulmonary embolism   . HX: breast cancer 04/01/2013  . Hyperlipidemia   . Hypertension   . Hypocalcemia 05/27/2017  . Hypokalemia 07/10/2017  . Hypomagnesemia 07/11/2017  . Hypothyroidism   . Hypoxia 03/23/2020  . Nausea vomiting and diarrhea 07/10/2017  . Non-intractable cyclical vomiting  with nausea   . Nuclear sclerotic cataract of both eyes 04/20/2016  . OSA (obstructive sleep apnea) 07/10/2017  . OSA on CPAP 04/01/2013  . OSA treated with BiPAP   . Physical debility 06/23/2017  . 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 Pulmonary embolism ([place]) 03/2013  . Pulmonary HTN ([place]) 07/05/2017  . Pulmonary hypertension ([place]) 07/05/2017  . Pulmonary nodule 07/26/2020  . Seizure ([place]) 07/10/2017  . Seizure disorder ([place]) 07/05/2017  . Seizures ([place])   . Sepsis ([place]) 07/10/2017  . Sleep apnea   . Status epilepticus ([place])   . 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 Tachypnea   . Thyroid disease    Hypothyroid   . Tracheostomy, acute management ([place])   . Type II diabetes mellitus ([place])     Past Surgical History:  Procedure Laterality Date  . ABDOMINAL HYSTERECTOMY  1970s  . BREAST BIOPSY Left 2006  . BREAST LUMPECTOMY Left 2006  . BUNIONECTOMY WITH HAMMERTOE RECONSTRUCTION Left   . CARDIAC CATHETERIZATION  06/2020  . ESOPHAGOGASTRODUODENOSCOPY N/A 05/29/2017   Procedure: ESOPHAGOGASTRODUODENOSCOPY (EGD);  Surgeon: Irene Shipper, MD;  Location: [facility];  Service: Endoscopy;  Laterality: N/A;  . JOINT REPLACEMENT    . LAPAROSCOPIC CHOLECYSTECTOMY  ~ 2005  . Pulmonary function test  2-3 weeks ago  . RIGHT/LEFT HEART CATH AND CORONARY ANGIOGRAPHY N/A 05/24/2020   Procedure: RIGHT/LEFT HEART CATH AND CORONARY ANGIOGRAPHY;  Surgeon: Jolaine Artist, MD;  Location: [facility];  Service: Cardiovascular;  Laterality: N/A;  . TOTAL HIP ARTHROPLASTY Left 2010  . TOTAL KNEE ARTHROPLASTY Bilateral 2005-2006    Current Medications: Current Meds  Medication Sig  . acetaminophen (TYLENOL) 500 MG tablet Take 500-1,000 mg by mouth every 6 (six) hours as needed for mild pain.   Marland Kitchen ALPRAZolam (XANAX) 0.5 MG tablet TAKE 1 TABLET BY MOUTH EVERYDAY AT BEDTIME (Patient taking differently: Take 0.5 mg by mouth at bedtime as needed for sleep.)  . atorvastatin (LIPITOR) 20 MG tablet Take 20 mg by mouth daily.  . benzonatate (TESSALON PERLES) 100 MG capsule Take 1 capsule (100 mg total) by mouth 3 (three) times daily as needed. (Patient taking differently: Take 100 mg by mouth 3 (three) times daily as needed for cough.)  . calcium-vitamin D (OSCAL WITH D) 500-200 MG-UNIT tablet Take 1 tablet by mouth 2 (two) times daily.  Marland Kitchen diltiazem (CARDIZEM) 30 MG tablet Take 30 mg by mouth 2 (two) times daily.   . DULoxetine (CYMBALTA) 60 MG capsule Take 1 capsule (60 mg total) by mouth daily.  . febuxostat (ULORIC) 40 MG tablet Take 40 mg daily by mouth.  . furosemide (LASIX) 40 MG tablet Take 40 mg by mouth  daily.  . insulin degludec (TRESIBA FLEXTOUCH) 100 UNIT/ML SOPN FlexTouch Pen Inject 24 Units into the skin daily.  Marland Kitchen levETIRAcetam (KEPPRA) 500 MG tablet Take 500 mg by mouth 2 (two) times daily.  Marland Kitchen levothyroxine (SYNTHROID) 100 MCG tablet Take 1 tablet (100 mcg total) by mouth daily before breakfast.  . loratadine (CLARITIN) 10 MG tablet Take 10 mg by mouth daily as needed for allergies.  . metoprolol succinate (TOPROL XL) 50 MG 24 hr tablet Take 1 tablet (50 mg total) by mouth daily. Take with or immediately following a meal.  . montelukast (SINGULAIR) 10 MG tablet Take 10 mg at bedtime by mouth.  . Multiple Vitamin (MULTIVITAMIN WITH MINERALS) TABS tablet Take 1 tablet by mouth daily. Unknown strengfth  . nateglinide (STARLIX) 120 MG tablet Take 120  mg by mouth daily.   Marland Kitchen omeprazole (PRILOSEC) 40 MG capsule Take 40 mg by mouth 2 (two) times daily.  . OXYGEN Inhale 4 L/min into the lungs as directed. 24 hours a day  . Semaglutide, 1 MG/DOSE, (OZEMPIC, 1 MG/DOSE,) 2 MG/1.5ML SOPN Inject 1 mg into the skin once a week.  . spironolactone (ALDACTONE) 25 MG tablet Take 12.5 mg by mouth daily.  . Tiotropium Bromide-Olodaterol (STIOLTO RESPIMAT) 2.5-2.5 MCG/ACT AERS Inhale 2 puffs into the lungs daily.  . Treprostinil (TYVASO) 0.6 MG/ML SOLN Inhale 18 mcg into the lungs in the morning, at noon, in the evening, and at bedtime.  Alveda Reasons 15 MG TABS tablet TAKE 1 TABLET BY MOUTH EVERY DAY (Patient taking differently: Take 15 mg by mouth daily with supper.)     Allergies:   Allopurinol and Aspirin   Social History   Socioeconomic History  . Marital status: Married    Spouse name: Not on file  . Number of children: 1  . Years of education: Not on file  . Highest education level: Not on file  Occupational History  . Occupation: retired  Tobacco Use  . Smoking status: Former Smoker    Packs/day: 1.00    Years: 30.00    Pack years: 30.00    Types: Cigarettes    Quit date: 1995    Years  since quitting: 27.3  . Smokeless tobacco: Never Used  . Tobacco comment: "quit smoking cigarettes in the 1990s"  Vaping Use  . Vaping Use: Never used  Substance and Sexual Activity  . Alcohol use: No  . Drug use: No  . Sexual activity: Not Currently  Other Topics Concern  . Not on file  Social History Narrative  . Not on file   Social Determinants of Health   Financial Resource Strain: Low Risk   . Difficulty of Paying Living Expenses: Not hard at all  Food Insecurity: No Food Insecurity  . Worried About Charity fundraiser in the Last Year: Never true  . Ran Out of Food in the Last Year: Never true  Transportation Needs: No Transportation Needs  . Lack of Transportation (Medical): No  . Lack of Transportation (Non-Medical): No  Physical Activity: Insufficiently Active  . Days of Exercise per Week: 4 days  . Minutes of Exercise per Session: 30 min  Stress: No Stress Concern Present  . Feeling of Stress : Not at all  Social Connections: Moderately Isolated  . Frequency of Communication with Friends and Family: More than three times a week  . Frequency of Social Gatherings with Friends and Family: Never  . Attends Religious Services: Never  . Active Member of Clubs or Organizations: No  . Attends Archivist Meetings: Never  . Marital Status: Married     Family History: The patient's family history includes Breast cancer in her cousin and sister; Colon cancer in her maternal grandmother; Diabetes in her sister and another family member; Hypertension in her mother; Kidney disease in her sister; Liver cancer in her mother; Uterine cancer in her mother. ROS:   Please see the history of present illness.    All 14 point review of systems negative except as described per history of present illness  EKGs/Labs/Other Studies Reviewed:      Recent Labs: 02/16/2020: ALT 8 05/14/2020: Platelets 248 05/24/2020: Hemoglobin 13.9 08/27/2020: BUN 37; Creat 1.45; Potassium 4.8;  Sodium 140; TSH 0.99  Recent Lipid Panel    Component Value Date/Time   CHOL  101 08/27/2020 1421   CHOL 131 07/13/2020 1556   TRIG 157 (H) 08/27/2020 1421   HDL 43 (L) 08/27/2020 1421   HDL 49 07/13/2020 1556   CHOLHDL 2.3 08/27/2020 1421   VLDL 29 07/11/2017 0347   LDLCALC 35 08/27/2020 1421    Physical Exam:    VS:  BP 108/62 (BP Location: Right Arm, Patient Position: Sitting)   Pulse 60   Ht _0  (1.549 m)   Wt 147 lb (66.7 kg)   SpO2 90%   BMI 27.78 kg/m     Wt Readings from Last 3 Encounters:  11/30/20 147 lb (66.7 kg)  10/21/20 148 lb 12.8 oz (67.5 kg)  10/18/20 157 lb (71.2 kg)     GEN:  Well nourished, well developed in no acute distress HEENT: Normal NECK: No JVD; No carotid bruits LYMPHATICS: No lymphadenopathy CARDIAC: RRR, no murmurs, no rubs, no gallops RESPIRATORY:  Clear to auscultation without rales, wheezing or rhonchi  ABDOMEN: Soft, non-tender, non-distended MUSCULOSKELETAL:  No edema; No deformity  SKIN: Warm and dry LOWER EXTREMITIES: no swelling NEUROLOGIC:  Alert and oriented x 3 PSYCHIATRIC:  Normal affect   ASSESSMENT:    1. Pulmonary hypertension (HCC)   2. Essential hypertension, benign   3. OSA treated with BiPAP   4. Atypical chest pain    PLAN:    In order of problems listed above:  1. Episode of chest pain I suspect is related to hypoxia with pulmonary hypertension with some effort she was brushing her teeth without oxygen which was quite significant effort for her.  I asked her to keep using oxygen as much as she can.  Also when she does some effort should to be careful. 2. Pulmonary hypertension management excellent blood goal is by our pulmonary hypertension clinic.  She does have appoint with them within the next few weeks.  I asked her to make sure that she mention about the fact that she did have a chest pain.  Some of the medications that I given for pulmonary hypertension can bring some chest pain.  Luckily there was only  one episode. 3. Obstructive sleep apnea managed with CPAP. 4. Essential hypertension blood pressure actually is on the lower side.  We will continue present management.   Medication Adjustments/Labs and Tests Ordered: Current medicines are reviewed at length with the patient today.  Concerns regarding medicines are outlined above.  No orders of the defined types were placed in this encounter.  Medication changes: No orders of the defined types were placed in this encounter.   Signed, Park Liter, MD, [name] 11/30/2020 3:25 PM    [place]

## 2020-12-01 ENCOUNTER — Ambulatory Visit: Payer: Medicare PPO | Admitting: Physical Therapy

## 2020-12-06 ENCOUNTER — Encounter: Payer: Self-pay | Admitting: Physical Therapy

## 2020-12-06 ENCOUNTER — Ambulatory Visit: Payer: Medicare PPO | Admitting: Physical Therapy

## 2020-12-06 ENCOUNTER — Other Ambulatory Visit: Payer: Self-pay

## 2020-12-06 DIAGNOSIS — M6281 Muscle weakness (generalized): Secondary | ICD-10-CM | POA: Diagnosis not present

## 2020-12-06 DIAGNOSIS — M62830 Muscle spasm of back: Secondary | ICD-10-CM

## 2020-12-06 DIAGNOSIS — R5381 Other malaise: Secondary | ICD-10-CM

## 2020-12-06 DIAGNOSIS — M545 Low back pain, unspecified: Secondary | ICD-10-CM

## 2020-12-06 DIAGNOSIS — R262 Difficulty in walking, not elsewhere classified: Secondary | ICD-10-CM

## 2020-12-06 DIAGNOSIS — Z9181 History of falling: Secondary | ICD-10-CM

## 2020-12-06 NOTE — Therapy (Signed)
[place]. [place], [name], [address] Phone: [phone]   Fax:  [phone]  Physical Therapy Treatment  Patient Details  Name: Amanda Short MRN: [id number] Date of Birth: 1941-03-12 No data recorded  Encounter Date: 12/06/2020   PT End of Session - 12/06/20 1436    Visit Number 5    Number of Visits 12    Date for PT Re-Evaluation 01/10/21    Authorization Type [employer]    PT Start Time 1350    PT Stop Time 1432    PT Time Calculation (min) 42 min    Activity Tolerance Patient tolerated treatment well;Patient limited by fatigue    Behavior During Therapy [facility] for tasks assessed/performed           Past Medical History:  Diagnosis Date  . Abdominal pain   . Abnormal CT of the abdomen   . Acute blood loss anemia   . Acute cystitis with hematuria 12/19/2019  . Acute encephalopathy 06/06/2017  . Acute pulmonary embolism ([place]) 04/01/2013  . Acute respiratory failure with hypoxia ([place])   . Anxiety state   . Arthritis    "back, hands" (07/22/2015)  . Aspiration pneumonia ([place])   . Benign essential HTN   . Cancer of left breast ([place]) 2006   S/P lumpectomy  . Cellulitis of foot, right 07/21/2015  . Cellulitis of right foot 07/21/2015  . Central retinal vein occlusion of right eye 04/20/2016  . Chronic diastolic CHF (congestive heart failure) ([place]) 07/10/2017  . Chronic respiratory failure with hypoxia ([place])   . Colitis 05/27/2017  . Complication of anesthesia    "brief breathing problem at surgery center in ~ 2005 when I had gallbladder OR"  . Concussion 03/2013   due to fall  . Depression   . Diabetes ([place]) 04/01/2013  . 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 Diabetes mellitus type 2, controlled ([place]) 07/21/2015  . Diabetic foot infection ([place]) 07/21/2015  . Diverticulitis of colon   . Diverticulitis of large intestine without perforation or abscess without bleeding   . Diverticulosis   . DVT (deep  venous thrombosis) ([place]) 03/2013   LLE  . Encephalopathy   . Encounter for attention to tracheostomy ([place])   . Encounter for central line placement   . Encounter for orogastric (OG) tube placement   . Essential hypertension 04/01/2013  . Essential hypertension, benign 04/01/2013  . GERD (gastroesophageal reflux disease)   . History of acute respiratory failure   . History of breast cancer 2006   S/P lumpectomy  . History of pulmonary embolism   . HX: breast cancer 04/01/2013  . Hyperlipidemia   . Hypertension   . Hypocalcemia 05/27/2017  . Hypokalemia 07/10/2017  . Hypomagnesemia 07/11/2017  . Hypothyroidism   . Hypoxia 03/23/2020  . Nausea vomiting and diarrhea 07/10/2017  . Non-intractable cyclical vomiting with nausea   . Nuclear sclerotic cataract of both eyes 04/20/2016  . OSA (obstructive sleep apnea) 07/10/2017  . OSA on CPAP 04/01/2013  . OSA treated with BiPAP   . Physical debility 06/23/2017  . 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 Pulmonary embolism ([place]) 03/2013  . Pulmonary HTN ([place]) 07/05/2017  . Pulmonary hypertension ([place]) 07/05/2017  . Pulmonary nodule 07/26/2020  . Seizure ([place]) 07/10/2017  . Seizure disorder ([place]) 07/05/2017  . Seizures ([place])   . Sepsis ([place]) 07/10/2017  . Sleep apnea   . Status epilepticus ([place])   . Status post trachelectomy  06/23/2017  . Tachypnea   . Thyroid disease    Hypothyroid  . Tracheostomy, acute management ([place])   . Type II diabetes mellitus ([place])     Past Surgical History:  Procedure Laterality Date  . ABDOMINAL HYSTERECTOMY  1970s  . BREAST BIOPSY Left 2006  . BREAST LUMPECTOMY Left 2006  . BUNIONECTOMY WITH HAMMERTOE RECONSTRUCTION Left   . CARDIAC CATHETERIZATION  06/2020  . ESOPHAGOGASTRODUODENOSCOPY N/A 05/29/2017   Procedure: ESOPHAGOGASTRODUODENOSCOPY (EGD);  Surgeon: Irene Shipper, MD;  Location: [facility];  Service: Endoscopy;  Laterality: N/A;  . JOINT REPLACEMENT    . LAPAROSCOPIC CHOLECYSTECTOMY  ~ 2005  .  Pulmonary function test  2-3 weeks ago  . RIGHT/LEFT HEART CATH AND CORONARY ANGIOGRAPHY N/A 05/24/2020   Procedure: RIGHT/LEFT HEART CATH AND CORONARY ANGIOGRAPHY;  Surgeon: Jolaine Artist, MD;  Location: [facility];  Service: Cardiovascular;  Laterality: N/A;  . TOTAL HIP ARTHROPLASTY Left 2010  . TOTAL KNEE ARTHROPLASTY Bilateral 2005-2006    There were no vitals filed for this visit.   Subjective Assessment - 12/06/20 1355    Subjective It has been two weeks since she has been in to PT, She has been placed on a new medication and has had some side effects that she has been struggling with.    Currently in Pain? No/denies                             [facility] Adult PT Treatment/Exercise - 12/06/20 0001      Ambulation/Gait   Gait Comments gait with HHA, 2 x 180 feet, on the second one she had to rest while stnading due to fatigue, stairs with 2 handrails up and over 2x, then gait to the car 130' with one rest break      Lumbar Exercises: Aerobic   Nustep Level 4 x 7 minutes cues to stay at 60 steps per minute      Lumbar Exercises: Standing   Row Both;Theraband;20 reps    Theraband Level (Row) Level 2 (Red)    Row Limitations on airex    Shoulder Extension Both;20 reps;Theraband    Theraband Level (Shoulder Extension) Level 2 (Red)    Shoulder Extension Limitations on airex    Other Standing Lumbar Exercises 3# hip abduction and hip extension 2x10      Lumbar Exercises: Seated   Long Arc Quad on Chair Both;2 sets;10 reps    LAQ on Chair Weights (lbs) 3#                    PT Short Term Goals - 12/04/19 1627      PT SHORT TERM GOAL #1   Title independent with intial HEP    Status Achieved             PT Long Term Goals - 11/22/20 1446      PT LONG TERM GOAL #1   Title increase overall hip LE strength for functional gait and safety hips and ankles to 4+/5    Status On-going      PT LONG TERM GOAL #2   Title decrease TUG time  to less than 14 seconds for functional gait and safety    Status Partially Met      PT LONG TERM GOAL #3   Title increase BERG to 50/56 for safety and independence    Status Partially Met  Plan - 12/06/20 1438    Clinical Impression Statement Paitne out the past two weeks, has a new medication that she is using, She had some side effects that she was unable to come in to see [place].  She does report a little better breathing and that the side effects are less, she seemed to tolerate the walking today with a little less fatigue, still fatigue in the legs and some shaking, needing a small standing rest break.    PT Next Visit Plan will resume and try to maximize her function    Consulted and Agree with Plan of Care Patient           Patient will benefit from skilled therapeutic intervention in order to improve the following deficits and impairments:  Abnormal gait,Decreased range of motion,Difficulty walking,Increased muscle spasms,Decreased activity tolerance,Pain,Decreased balance,Impaired flexibility,Improper body mechanics,Postural dysfunction,Decreased strength,Decreased mobility  Visit Diagnosis: Muscle weakness (generalized)  Risk for falls  Difficulty in walking, not elsewhere classified  Physical debility  Acute bilateral low back pain without sciatica  Muscle spasm of back     Problem List Patient Active Problem List   Diagnosis Date Noted  . Atypical chest pain 11/30/2020  . Type II diabetes mellitus ([place])   . Tracheostomy, acute management ([place])   . Thyroid disease   . Tachypnea   . Sleep apnea   . OSA treated with BiPAP   . Hypertension   . History of pulmonary embolism   . Encounter for orogastric (OG) tube placement   . Encounter for central line placement   . Encounter for attention to tracheostomy ([place])   . Encephalopathy   . Diverticulitis of colon   . 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 Benign essential HTN   .  Aspiration pneumonia ([place])   . Acute respiratory failure with hypoxia ([place])   . Abdominal pain   . Snoring 10/18/2020  . COPD (chronic obstructive pulmonary disease) ([place]) 07/26/2020  . Pulmonary nodule 07/26/2020  . Abnormal CT of the abdomen   . Seizures ([place])   . Hyperlipidemia   . GERD (gastroesophageal reflux disease)   . Diverticulosis   . Depression   . Complication of anesthesia   . Arthritis   . Hypoxia 03/23/2020  . Acute cystitis with hematuria 12/19/2019  . Hypomagnesemia 07/11/2017  . Hypokalemia 07/10/2017  . Chronic diastolic CHF (congestive heart failure) ([place]) 07/10/2017  . Seizure ([place]) 07/10/2017  . OSA (obstructive sleep apnea) 07/10/2017  . Sepsis ([place]) 07/10/2017  . Nausea vomiting and diarrhea 07/10/2017  . Seizure disorder ([place]) 07/05/2017  . Pulmonary hypertension ([place]) 07/05/2017  . Pulmonary HTN ([place]) 07/05/2017  . Anxiety state   . Physical debility 06/23/2017  . 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 History of acute respiratory failure   . Status epilepticus ([place])   . Acute blood loss anemia   . Chronic respiratory failure with hypoxia ([place])   . Acute encephalopathy 06/06/2017  . Diverticulitis of large intestine without perforation or abscess without bleeding   . Non-intractable cyclical vomiting with nausea   . Hypocalcemia 05/27/2017  . 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 Colitis 05/27/2017  . Central retinal vein occlusion of right eye 04/20/2016  . Nuclear sclerotic cataract of both eyes 04/20/2016  . Diabetic foot infection ([place]) 07/21/2015  . Diabetes mellitus type 2, controlled ([place]) 07/21/2015  . Cellulitis of foot, right 07/21/2015  . Cellulitis of right foot 07/21/2015  . Acute pulmonary embolism ([place]) 04/01/2013  . DVT (  deep venous thrombosis) ([place]) 04/01/2013  . Concussion 04/01/2013  . Essential hypertension 04/01/2013  . Hypothyroidism 04/01/2013  . OSA on CPAP 04/01/2013  . HX: breast cancer 04/01/2013  . Essential  hypertension, benign 04/01/2013  . Diabetes ([place]) 04/01/2013  . Pulmonary embolism ([place]) 03/2013  . History of breast cancer 2006  . Cancer of left breast [facility]) 2006    [name] Boast., PT 12/06/2020, 2:42 PM  [place]. [place], [name], [address] Phone: [phone]   Fax:  [phone]  Name: Debie Ashline MRN: [id number] Date of Birth: 08/02/1940

## 2020-12-08 ENCOUNTER — Other Ambulatory Visit: Payer: Self-pay

## 2020-12-08 ENCOUNTER — Encounter: Payer: Self-pay | Admitting: Physical Therapy

## 2020-12-08 ENCOUNTER — Ambulatory Visit: Payer: Medicare PPO | Admitting: Physical Therapy

## 2020-12-08 DIAGNOSIS — M545 Low back pain, unspecified: Secondary | ICD-10-CM

## 2020-12-08 DIAGNOSIS — M6281 Muscle weakness (generalized): Secondary | ICD-10-CM | POA: Diagnosis not present

## 2020-12-08 DIAGNOSIS — M62830 Muscle spasm of back: Secondary | ICD-10-CM

## 2020-12-08 DIAGNOSIS — R5381 Other malaise: Secondary | ICD-10-CM

## 2020-12-08 DIAGNOSIS — Z9181 History of falling: Secondary | ICD-10-CM

## 2020-12-08 DIAGNOSIS — R2689 Other abnormalities of gait and mobility: Secondary | ICD-10-CM

## 2020-12-08 DIAGNOSIS — R262 Difficulty in walking, not elsewhere classified: Secondary | ICD-10-CM

## 2020-12-08 NOTE — Therapy (Signed)
[place] [place], [name], [address] Phone: [phone]   Fax:  [phone]  Physical Therapy Treatment  Patient Details  Name: Amanda Short MRN: [id number] Date of Birth: 12-06-1940 No data recorded  Encounter Date: 12/08/2020   PT End of Session - 12/08/20 1541    Visit Number 66    Number of Visits 95    Date for PT Re-Evaluation 01/10/21    Authorization Type [employer]    PT Start Time 1350    PT Stop Time 1433    PT Time Calculation (min) 43 min    Activity Tolerance Patient tolerated treatment well    Behavior During Therapy [facility] for tasks assessed/performed           Past Medical History:  Diagnosis Date  . Abdominal pain   . Abnormal CT of the abdomen   . Acute blood loss anemia   . Acute cystitis with hematuria 12/19/2019  . Acute encephalopathy 06/06/2017  . Acute pulmonary embolism ([place]) 04/01/2013  . Acute respiratory failure with hypoxia ([place])   . Anxiety state   . Arthritis    "back, hands" (07/22/2015)  . Aspiration pneumonia ([place])   . Benign essential HTN   . Cancer of left breast ([place]) 2006   S/P lumpectomy  . Cellulitis of foot, right 07/21/2015  . Cellulitis of right foot 07/21/2015  . Central retinal vein occlusion of right eye 04/20/2016  . Chronic diastolic CHF (congestive heart failure) ([place]) 07/10/2017  . Chronic respiratory failure with hypoxia ([place])   . Colitis 05/27/2017  . Complication of anesthesia    "brief breathing problem at surgery center in ~ 2005 when I had gallbladder OR"  . Concussion 03/2013   due to fall  . Depression   . Diabetes ([place]) 04/01/2013  . 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 Diabetes mellitus type 2, controlled ([place]) 07/21/2015  . Diabetic foot infection ([place]) 07/21/2015  . Diverticulitis of colon   . Diverticulitis of large intestine without perforation or abscess without bleeding   . Diverticulosis   . DVT (deep venous thrombosis) ([place])  03/2013   LLE  . Encephalopathy   . Encounter for attention to tracheostomy ([place])   . Encounter for central line placement   . Encounter for orogastric (OG) tube placement   . Essential hypertension 04/01/2013  . Essential hypertension, benign 04/01/2013  . GERD (gastroesophageal reflux disease)   . History of acute respiratory failure   . History of breast cancer 2006   S/P lumpectomy  . History of pulmonary embolism   . HX: breast cancer 04/01/2013  . Hyperlipidemia   . Hypertension   . Hypocalcemia 05/27/2017  . Hypokalemia 07/10/2017  . Hypomagnesemia 07/11/2017  . Hypothyroidism   . Hypoxia 03/23/2020  . Nausea vomiting and diarrhea 07/10/2017  . Non-intractable cyclical vomiting with nausea   . Nuclear sclerotic cataract of both eyes 04/20/2016  . OSA (obstructive sleep apnea) 07/10/2017  . OSA on CPAP 04/01/2013  . OSA treated with BiPAP   . Physical debility 06/23/2017  . 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 Pulmonary embolism ([place]) 03/2013  . Pulmonary HTN ([place]) 07/05/2017  . Pulmonary hypertension ([place]) 07/05/2017  . Pulmonary nodule 07/26/2020  . Seizure ([place]) 07/10/2017  . Seizure disorder ([place]) 07/05/2017  . Seizures ([place])   . Sepsis ([place]) 07/10/2017  . Sleep apnea   . Status epilepticus ([place])   . 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  Tachypnea   . Thyroid disease    Hypothyroid  . Tracheostomy, acute management ([place])   . Type II diabetes mellitus ([place])     Past Surgical History:  Procedure Laterality Date  . ABDOMINAL HYSTERECTOMY  1970s  . BREAST BIOPSY Left 2006  . BREAST LUMPECTOMY Left 2006  . BUNIONECTOMY WITH HAMMERTOE RECONSTRUCTION Left   . CARDIAC CATHETERIZATION  06/2020  . ESOPHAGOGASTRODUODENOSCOPY N/A 05/29/2017   Procedure: ESOPHAGOGASTRODUODENOSCOPY (EGD);  Surgeon: Irene Shipper, MD;  Location: [facility];  Service: Endoscopy;  Laterality: N/A;  . JOINT REPLACEMENT    . LAPAROSCOPIC CHOLECYSTECTOMY  ~ 2005  . Pulmonary function test  2-3  weeks ago  . RIGHT/LEFT HEART CATH AND CORONARY ANGIOGRAPHY N/A 05/24/2020   Procedure: RIGHT/LEFT HEART CATH AND CORONARY ANGIOGRAPHY;  Surgeon: Jolaine Artist, MD;  Location: [facility];  Service: Cardiovascular;  Laterality: N/A;  . TOTAL HIP ARTHROPLASTY Left 2010  . TOTAL KNEE ARTHROPLASTY Bilateral 2005-2006    There were no vitals filed for this visit.   Subjective Assessment - 12/08/20 1425    Subjective reports that she had increased LBP and hip pain this morning    Currently in Pain? Yes    Pain Score 6     Pain Location Back    Aggravating Factors  woke up hurting and had difficulty walking                             [facility] Adult PT Treatment/Exercise - 12/08/20 0001      Ambulation/Gait   Gait Comments gait with light HHA 4x100 feet, O2 saturation remained at88% for all      High Level Balance   High Level Balance Activities Side stepping;Backward walking;Direction changes      Lumbar Exercises: Aerobic   Nustep level 5 x 8 minutes      Lumbar Exercises: Standing   Row Both;Theraband;20 reps    Theraband Level (Row) Level 2 (Red)    Row Limitations on airex    Shoulder Extension Both;20 reps;Theraband    Theraband Level (Shoulder Extension) Level 2 (Red)    Shoulder Extension Limitations on airex    Other Standing Lumbar Exercises 3# hip abduction and hip extension 2x10      Lumbar Exercises: Seated   Long Arc Quad on Chair Both;2 sets;10 reps    LAQ on Chair Weights (lbs) 3#    Other Seated Lumbar Exercises marching 3# 2x10 each, 3# bicep curls    Other Seated Lumbar Exercises red tband knee flexion 2x10, red tband ankle exercises                    PT Short Term Goals - 12/04/19 1627      PT SHORT TERM GOAL #1   Title independent with intial HEP    Status Achieved             PT Long Term Goals - 12/08/20 1545      PT LONG TERM GOAL #1   Title increase overall hip LE strength for functional gait and  safety hips and ankles to 4+/5    Status Partially Met      PT LONG TERM GOAL #2   Title decrease TUG time to less than 14 seconds for functional gait and safety    Status Partially Met      PT LONG TERM GOAL #3   Title increase BERG to 50/56  for safety and independence    Status Partially Met                 Plan - 12/08/20 1542    Clinical Impression Statement reported more pain in the low back and the hips today, this did not slow her down and she did not want to treat the pain, the new medication has continued to bother her with the poor sleep and other issues.  She has been able to keep oxygen above 88% on the 4L O2 the last few times I have seen her, she does report that she thinks she is doing better but does report that yesterday she never had oxygen above 88% and felt bad(fatigue)    PT Next Visit Plan will resume and try to maximize her function    Consulted and Agree with Plan of Care Patient           Patient will benefit from skilled therapeutic intervention in order to improve the following deficits and impairments:  Abnormal gait,Decreased range of motion,Difficulty walking,Increased muscle spasms,Decreased activity tolerance,Pain,Decreased balance,Impaired flexibility,Improper body mechanics,Postural dysfunction,Decreased strength,Decreased mobility  Visit Diagnosis: Muscle weakness (generalized)  Risk for falls  Difficulty in walking, not elsewhere classified  Physical debility  Acute bilateral low back pain without sciatica  Muscle spasm of back  Other abnormalities of gait and mobility     Problem List Patient Active Problem List   Diagnosis Date Noted  . Atypical chest pain 11/30/2020  . Type II diabetes mellitus ([place])   . Tracheostomy, acute management ([place])   . Thyroid disease   . Tachypnea   . Sleep apnea   . OSA treated with BiPAP   . Hypertension   . History of pulmonary embolism   . Encounter for orogastric (OG) tube placement    . Encounter for central line placement   . Encounter for attention to tracheostomy ([place])   . Encephalopathy   . Diverticulitis of colon   . 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 Benign essential HTN   . Aspiration pneumonia ([place])   . Acute respiratory failure with hypoxia ([place])   . Abdominal pain   . Snoring 10/18/2020  . COPD (chronic obstructive pulmonary disease) ([place]) 07/26/2020  . Pulmonary nodule 07/26/2020  . Abnormal CT of the abdomen   . Seizures ([place])   . Hyperlipidemia   . GERD (gastroesophageal reflux disease)   . Diverticulosis   . Depression   . Complication of anesthesia   . Arthritis   . Hypoxia 03/23/2020  . Acute cystitis with hematuria 12/19/2019  . Hypomagnesemia 07/11/2017  . Hypokalemia 07/10/2017  . Chronic diastolic CHF (congestive heart failure) ([place]) 07/10/2017  . Seizure ([place]) 07/10/2017  . OSA (obstructive sleep apnea) 07/10/2017  . Sepsis ([place]) 07/10/2017  . Nausea vomiting and diarrhea 07/10/2017  . Seizure disorder ([place]) 07/05/2017  . Pulmonary hypertension ([place]) 07/05/2017  . Pulmonary HTN ([place]) 07/05/2017  . Anxiety state   . Physical debility 06/23/2017  . 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 History of acute respiratory failure   . Status epilepticus ([place])   . Acute blood loss anemia   . Chronic respiratory failure with hypoxia ([place])   . Acute encephalopathy 06/06/2017  . Diverticulitis of large intestine without perforation or abscess without bleeding   . Non-intractable cyclical vomiting with nausea   . Hypocalcemia 05/27/2017  . 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 Colitis 05/27/2017  . Central retinal vein occlusion of right eye  04/20/2016  . Nuclear sclerotic cataract of both eyes 04/20/2016  . Diabetic foot infection ([place]) 07/21/2015  . Diabetes mellitus type 2, controlled ([place]) 07/21/2015  . Cellulitis of foot, right 07/21/2015  . Cellulitis of right foot 07/21/2015  . Acute pulmonary embolism ([place]) 04/01/2013   . DVT (deep venous thrombosis) ([place]) 04/01/2013  . Concussion 04/01/2013  . Essential hypertension 04/01/2013  . Hypothyroidism 04/01/2013  . OSA on CPAP 04/01/2013  . HX: breast cancer 04/01/2013  . Essential hypertension, benign 04/01/2013  . Diabetes ([place]) 04/01/2013  . Pulmonary embolism ([place]) 03/2013  . History of breast cancer 2006  . Cancer of left breast [facility]) 2006    [name] Boast., PT 12/08/2020, 3:46 PM  [place]. [place], [name], [address] Phone: [phone]   Fax:  [phone]  Name: Amanda Short MRN: [id number] Date of Birth: 07/21/1940

## 2020-12-22 ENCOUNTER — Other Ambulatory Visit: Payer: Self-pay

## 2020-12-22 ENCOUNTER — Ambulatory Visit: Payer: Medicare PPO | Attending: Family Medicine | Admitting: Physical Therapy

## 2020-12-22 ENCOUNTER — Encounter: Payer: Self-pay | Admitting: Physical Therapy

## 2020-12-22 DIAGNOSIS — Z9181 History of falling: Secondary | ICD-10-CM | POA: Diagnosis present

## 2020-12-22 DIAGNOSIS — R2689 Other abnormalities of gait and mobility: Secondary | ICD-10-CM | POA: Insufficient documentation

## 2020-12-22 DIAGNOSIS — R262 Difficulty in walking, not elsewhere classified: Secondary | ICD-10-CM | POA: Diagnosis present

## 2020-12-22 DIAGNOSIS — R5381 Other malaise: Secondary | ICD-10-CM | POA: Diagnosis present

## 2020-12-22 DIAGNOSIS — M6281 Muscle weakness (generalized): Secondary | ICD-10-CM

## 2020-12-22 DIAGNOSIS — M62830 Muscle spasm of back: Secondary | ICD-10-CM | POA: Diagnosis present

## 2020-12-22 DIAGNOSIS — M545 Low back pain, unspecified: Secondary | ICD-10-CM | POA: Diagnosis present

## 2020-12-22 NOTE — Therapy (Signed)
[place] [address]. [place], [name], [address] Phone: [phone]   Fax:  [phone]  Physical Therapy Treatment  Patient Details  Name: Amanda Short MRN: [id number] Date of Birth: [birth year] No data recorded  Encounter Date: 12/22/2020   PT End of Session - 12/22/20 1611    Visit Number 98    Number of Visits 21    Date for PT Re-Evaluation 01/10/21    Authorization Type [employer]    PT Start Time 1525    PT Stop Time 1610    PT Time Calculation (min) 45 min    Activity Tolerance Patient limited by fatigue    Behavior During Therapy [facility] for tasks assessed/performed           Past Medical History:  Diagnosis Date  . Abdominal pain   . Abnormal CT of the abdomen   . Acute blood loss anemia   . Acute cystitis with hematuria 12/19/2019  . Acute encephalopathy 06/06/2017  . Acute pulmonary embolism ([place]) 04/01/2013  . Acute respiratory failure with hypoxia ([place])   . Anxiety state   . Arthritis    "back, hands" (07/22/2015)  . Aspiration pneumonia ([place])   . Benign essential HTN   . Cancer of left breast ([place]) 2006   S/P lumpectomy  . Cellulitis of foot, right 07/21/2015  . Cellulitis of right foot 07/21/2015  . Central retinal vein occlusion of right eye 04/20/2016  . Chronic diastolic CHF (congestive heart failure) ([place]) 07/10/2017  . Chronic respiratory failure with hypoxia ([place])   . Colitis 05/27/2017  . Complication of anesthesia    "brief breathing problem at surgery center in ~ 2005 when I had gallbladder OR"  . Concussion 03/2013   due to fall  . Depression   . Diabetes ([place]) 04/01/2013  . 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 Diabetes mellitus type 2, controlled ([place]) 07/21/2015  . Diabetic foot infection ([place]) 07/21/2015  . Diverticulitis of colon   . Diverticulitis of large intestine without perforation or abscess without bleeding   . Diverticulosis   . DVT (deep venous thrombosis) ([place]) 03/2013    LLE  . Encephalopathy   . Encounter for attention to tracheostomy ([place] )   . Encounter for central line placement   . Encounter for orogastric (OG) tube placement   . Essential hypertension 04/01/2013  . Essential hypertension, benign 04/01/2013  . GERD (gastroesophageal reflux disease)   . History of acute respiratory failure   . History of breast cancer 2006   S/P lumpectomy  . History of pulmonary embolism   . HX: breast cancer 04/01/2013  . Hyperlipidemia   . Hypertension   . Hypocalcemia 05/27/2017  . Hypokalemia 07/10/2017  . Hypomagnesemia 07/11/2017  . Hypothyroidism   . Hypoxia 03/23/2020  . Nausea vomiting and diarrhea 07/10/2017  . Non-intractable cyclical vomiting with nausea   . Nuclear sclerotic cataract of both eyes 04/20/2016  . OSA (obstructive sleep apnea) 07/10/2017  . OSA on CPAP 04/01/2013  . OSA treated with BiPAP   . Physical debility 06/23/2017  . 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 Pulmonary embolism ([place]) 03/2013  . Pulmonary HTN ([place]) 07/05/2017  . Pulmonary hypertension ([place]) 07/05/2017  . Pulmonary nodule 07/26/2020  . Seizure ([place]) 07/10/2017  . Seizure disorder ([place]) 07/05/2017  . Seizures ([place])   . Sepsis ([place]) 07/10/2017  . Sleep apnea   . Status epilepticus ([place])   . 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  Tachypnea   . Thyroid disease    Hypothyroid  . Tracheostomy, acute management ([place])   . Type II diabetes mellitus ([place])     Past Surgical History:  Procedure Laterality Date  . ABDOMINAL HYSTERECTOMY  1970s  . BREAST BIOPSY Left 2006  . BREAST LUMPECTOMY Left 2006  . BUNIONECTOMY WITH HAMMERTOE RECONSTRUCTION Left   . CARDIAC CATHETERIZATION  06/2020  . ESOPHAGOGASTRODUODENOSCOPY N/A 05/29/2017   Procedure: ESOPHAGOGASTRODUODENOSCOPY (EGD);  Surgeon: Irene Shipper, MD;  Location: [facility];  Service: Endoscopy;  Laterality: N/A;  . JOINT REPLACEMENT    . LAPAROSCOPIC CHOLECYSTECTOMY  ~ 2005  . Pulmonary function test  2-3 weeks ago   . RIGHT/LEFT HEART CATH AND CORONARY ANGIOGRAPHY N/A 05/24/2020   Procedure: RIGHT/LEFT HEART CATH AND CORONARY ANGIOGRAPHY;  Surgeon: Jolaine Artist, MD;  Location: [facility];  Service: Cardiovascular;  Laterality: N/A;  . TOTAL HIP ARTHROPLASTY Left 2010  . TOTAL KNEE ARTHROPLASTY Bilateral 2005-2006    There were no vitals filed for this visit.   Subjective Assessment - 12/22/20 1531    Subjective Patient reports that she has continued to have issues with the medication, she is reporting very weak legs and difficulty walking, she reports increased LBP and hip pain    Currently in Pain? Yes    Pain Score 5     Pain Location Hip    Pain Orientation Right;Left    Pain Descriptors / Indicators Aching    Aggravating Factors  worse in the morning                             [facility] Adult PT Treatment/Exercise - 12/22/20 0001      Ambulation/Gait   Gait Comments gait with light HHA 4x100 feet, O2 saturation remained at88% for all, stiars step over step close CGA      High Level Balance   High Level Balance Activities Side stepping;Backward walking;Direction changes      Lumbar Exercises: Aerobic   Nustep level 4 x 7 minutes      Lumbar Exercises: Seated   Long Arc Quad on Chair Both;2 sets;10 reps    Other Seated Lumbar Exercises marching 3# 2x10 each, 3# bicep curls                    PT Short Term Goals - 12/04/19 1627      PT SHORT TERM GOAL #1   Title independent with intial HEP    Status Achieved             PT Long Term Goals - 12/22/20 1618      PT LONG TERM GOAL #1   Title increase overall hip LE strength for functional gait and safety hips and ankles to 4+/5    Status Partially Met      PT LONG TERM GOAL #2   Title decrease TUG time to less than 14 seconds for functional gait and safety    Status Partially Met      PT LONG TERM GOAL #3   Title increase BERG to 50/56 for safety and independence    Status Partially  Met                 Plan - 12/22/20 1611    Clinical Impression Statement Patient has not been in to PT in 2 weeks, they report some issues with the new medication. She has better O2 saturation  from 89% -95% however she is having difficulty with endurance, very shaky at 100 feet and some difficulty with weakness of the LE's    PT Next Visit Plan work on the leg strength and her endurance, she is limited by fatigue    Consulted and Agree with Plan of Care Patient           Patient will benefit from skilled therapeutic intervention in order to improve the following deficits and impairments:  Abnormal gait,Decreased range of motion,Difficulty walking,Increased muscle spasms,Decreased activity tolerance,Pain,Decreased balance,Impaired flexibility,Improper body mechanics,Postural dysfunction,Decreased strength,Decreased mobility  Visit Diagnosis: Muscle weakness (generalized)  Risk for falls  Difficulty in walking, not elsewhere classified  Physical debility  Acute bilateral low back pain without sciatica  Muscle spasm of back     Problem List Patient Active Problem List   Diagnosis Date Noted  . Atypical chest pain 11/30/2020  . Type II diabetes mellitus ([place])   . Tracheostomy, acute management ([place])   . Thyroid disease   . Tachypnea   . Sleep apnea   . OSA treated with BiPAP   . Hypertension   . History of pulmonary embolism   . Encounter for orogastric (OG) tube placement   . Encounter for central line placement   . Encounter for attention to tracheostomy ([place])   . Encephalopathy   . Diverticulitis of colon   . 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 Benign essential HTN   . Aspiration pneumonia ([place])   . Acute respiratory failure with hypoxia ([place])   . Abdominal pain   . Snoring 10/18/2020  . COPD (chronic obstructive pulmonary disease) ([place]) 07/26/2020  . Pulmonary nodule 07/26/2020  . Abnormal CT of the abdomen   . Seizures ([place])   .  Hyperlipidemia   . GERD (gastroesophageal reflux disease)   . Diverticulosis   . Depression   . Complication of anesthesia   . Arthritis   . Hypoxia 03/23/2020  . Acute cystitis with hematuria 12/19/2019  . Hypomagnesemia 07/11/2017  . Hypokalemia 07/10/2017  . Chronic diastolic CHF (congestive heart failure) ([place]) 07/10/2017  . Seizure ([place]) 07/10/2017  . OSA (obstructive sleep apnea) 07/10/2017  . Sepsis ([place]) 07/10/2017  . Nausea vomiting and diarrhea 07/10/2017  . Seizure disorder ([place]) 07/05/2017  . Pulmonary hypertension ([place]) 07/05/2017  . Pulmonary HTN ([place]) 07/05/2017  . Anxiety state   . Physical debility 06/23/2017  . 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 History of acute respiratory failure   . Status epilepticus ([place])   . Acute blood loss anemia   . Chronic respiratory failure with hypoxia ([place])   . Acute encephalopathy 06/06/2017  . Diverticulitis of large intestine without perforation or abscess without bleeding   . Non-intractable cyclical vomiting with nausea   . Hypocalcemia 05/27/2017  . 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 Colitis 05/27/2017  . Central retinal vein occlusion of right eye 04/20/2016  . Nuclear sclerotic cataract of both eyes 04/20/2016  . Diabetic foot infection ([place]) 07/21/2015  . Diabetes mellitus type 2, controlled ([place]) 07/21/2015  . Cellulitis of foot, right 07/21/2015  . Cellulitis of right foot 07/21/2015  . Acute pulmonary embolism ([place]) 04/01/2013  . DVT (deep venous thrombosis) ([place]) 04/01/2013  . Concussion 04/01/2013  . Essential hypertension 04/01/2013  . Hypothyroidism 04/01/2013  . OSA on CPAP 04/01/2013  . HX: breast cancer 04/01/2013  . Essential hypertension, benign 04/01/2013  . Diabetes ([place]) 04/01/2013  . Pulmonary embolism ([place]) 03/2013  . History of breast  cancer 2006  . Cancer of left breast [facility]) 2006    [name] Boast., PT 12/22/2020, 4:20 PM  [place]. [place], [name], [address] Phone: [phone]   Fax:  [phone]  Name: Seleen Walter MRN: [id number] Date of Birth: [birth year]

## 2020-12-24 ENCOUNTER — Ambulatory Visit: Payer: Medicare PPO | Admitting: Physical Therapy

## 2020-12-24 ENCOUNTER — Other Ambulatory Visit: Payer: Self-pay

## 2020-12-24 ENCOUNTER — Encounter: Payer: Self-pay | Admitting: Physical Therapy

## 2020-12-24 DIAGNOSIS — M6281 Muscle weakness (generalized): Secondary | ICD-10-CM | POA: Diagnosis not present

## 2020-12-24 DIAGNOSIS — Z9181 History of falling: Secondary | ICD-10-CM

## 2020-12-24 DIAGNOSIS — R5381 Other malaise: Secondary | ICD-10-CM

## 2020-12-24 DIAGNOSIS — R262 Difficulty in walking, not elsewhere classified: Secondary | ICD-10-CM

## 2020-12-24 DIAGNOSIS — M62830 Muscle spasm of back: Secondary | ICD-10-CM

## 2020-12-24 DIAGNOSIS — R2689 Other abnormalities of gait and mobility: Secondary | ICD-10-CM

## 2020-12-24 DIAGNOSIS — M545 Low back pain, unspecified: Secondary | ICD-10-CM

## 2020-12-24 NOTE — Therapy (Signed)
[place] [place], [name], [address] Phone: [phone]   Fax:  [phone]  Physical Therapy Treatment  Patient Details  Name: Amanda Short MRN: [id number] Date of Birth: 1941-01-17 No data recorded  Encounter Date: 12/24/2020   PT End of Session - 12/24/20 1142     Visit Number 73    Number of Visits 4    Date for PT Re-Evaluation 01/10/21    Authorization Type [employer]    PT Start Time 0174    PT Stop Time 1135    PT Time Calculation (min) 44 min    Activity Tolerance Patient limited by fatigue    Behavior During Therapy [facility] for tasks assessed/performed             Past Medical History:  Diagnosis Date   Abdominal pain    Abnormal CT of the abdomen    Acute blood loss anemia    Acute cystitis with hematuria 12/19/2019   Acute encephalopathy 06/06/2017   Acute pulmonary embolism ([place]) 04/01/2013   Acute respiratory failure with hypoxia ([place])    Anxiety state    Arthritis    "back, hands" (07/22/2015)   Aspiration pneumonia (HCC)    Benign essential HTN    Cancer of left breast ([place]) 2006   S/P lumpectomy   Cellulitis of foot, right 07/21/2015   Cellulitis of right foot 07/21/2015   Central retinal vein occlusion of right eye 04/20/2016   Chronic diastolic CHF (congestive heart failure) ([place]) 07/10/2017   Chronic respiratory failure with hypoxia ([place])    Colitis 94/49/6759   Complication of anesthesia    "brief breathing problem at surgery center in ~ 2005 when I had gallbladder OR"   Concussion 03/2013   due to fall   Depression    Diabetes ([place]) 04/01/2013   Diabetes mellitus type 2 in nonobese ([place])    Diabetes mellitus type 2, controlled ([place]) 07/21/2015   Diabetic foot infection ([place]) 07/21/2015   Diverticulitis of colon    Diverticulitis of large intestine without perforation or abscess without bleeding    Diverticulosis    DVT (deep venous thrombosis) ([place]) 03/2013   LLE   Encephalopathy     Encounter for attention to tracheostomy [facility])    Encounter for central line placement    Encounter for orogastric (OG) tube placement    Essential hypertension 04/01/2013   Essential hypertension, benign 04/01/2013   GERD (gastroesophageal reflux disease)    History of acute respiratory failure    History of breast cancer 2006   S/P lumpectomy   History of pulmonary embolism    HX: breast cancer 04/01/2013   Hyperlipidemia    Hypertension    Hypocalcemia 05/27/2017   Hypokalemia 07/10/2017   Hypomagnesemia 07/11/2017   Hypothyroidism    Hypoxia 03/23/2020   Nausea vomiting and diarrhea 07/10/2017   Non-intractable cyclical vomiting with nausea    Nuclear sclerotic cataract of both eyes 04/20/2016   OSA (obstructive sleep apnea) 07/10/2017   OSA on CPAP 04/01/2013   OSA treated with BiPAP    Physical debility 06/23/2017   Prolonged QT interval 05/27/2017   Pulmonary embolism ([place]) 03/2013   Pulmonary HTN ([place]) 07/05/2017   Pulmonary hypertension ([place]) 07/05/2017   Pulmonary nodule 07/26/2020   Seizure () 07/10/2017   Seizure disorder ([place]) 07/05/2017   Seizures ([place])    Sepsis ([place]) 07/10/2017   Sleep apnea    Status epilepticus ([place])    Status post trachelectomy  06/23/2017   Tachypnea    Thyroid disease    Hypothyroid   Tracheostomy, acute management ([place])    Type II diabetes mellitus ([place])     Past Surgical History:  Procedure Laterality Date   ABDOMINAL HYSTERECTOMY  1970s   BREAST BIOPSY Left 2006   BREAST LUMPECTOMY Left 2006   BUNIONECTOMY WITH HAMMERTOE RECONSTRUCTION Left    CARDIAC CATHETERIZATION  06/2020   ESOPHAGOGASTRODUODENOSCOPY N/A 05/29/2017   Procedure: ESOPHAGOGASTRODUODENOSCOPY (EGD);  Surgeon: Irene Shipper, MD;  [facility];  Service: Endoscopy;  Laterality: N/A;   JOINT REPLACEMENT     LAPAROSCOPIC CHOLECYSTECTOMY  ~ 2005   Pulmonary function test  2-3 weeks ago   RIGHT/LEFT HEART CATH AND CORONARY ANGIOGRAPHY N/A 05/24/2020    Procedure: RIGHT/LEFT HEART CATH AND CORONARY ANGIOGRAPHY;  Surgeon: Jolaine Artist, MD;  Location: [facility];  Service: Cardiovascular;  Laterality: N/A;   TOTAL HIP ARTHROPLASTY Left 2010   TOTAL KNEE ARTHROPLASTY Bilateral 2005-2006    There were no vitals filed for this visit.   Subjective Assessment - 12/24/20 1052     Subjective Patient reports that she was really fatigued after the last visit    Currently in Pain? Yes    Pain Score 7     Pain Location Back    Pain Orientation Lower    Pain Descriptors / Indicators Sore;Tightness    Aggravating Factors  woke up with pain in the back                               [facility] Adult PT Treatment/Exercise - 12/24/20 0001       Ambulation/Gait   Gait Comments gait with HHA x 75', then fatigue in the chest and legs, we repeated this 5 more times wiht same result unable to go much past 70 feet prior to SOB and leg fatigue      Lumbar Exercises: Aerobic   Nustep level 5 x 7 minutes      Lumbar Exercises: Standing   Other Standing Lumbar Exercises 3# hip abduction and hip extension 2x10      Lumbar Exercises: Seated   Long Arc Quad on Chair Both;2 sets;10 reps    LAQ on Chair Weights (lbs) 3#    Other Seated Lumbar Exercises marching 3# 2x10 each, 3# bicep curls    Other Seated Lumbar Exercises red tband knee flexion 2x10, red tband ankle exercises                      PT Short Term Goals - 12/04/19 1627       PT SHORT TERM GOAL #1   Title independent with intial HEP    Status Achieved               PT Long Term Goals - 12/22/20 1618       PT LONG TERM GOAL #1   Title increase overall hip LE strength for functional gait and safety hips and ankles to 4+/5    Status Partially Met      PT LONG TERM GOAL #2   Title decrease TUG time to less than 14 seconds for functional gait and safety    Status Partially Met      PT LONG TERM GOAL #3   Title increase BERG to 50/56 for  safety and independence    Status Partially Met  Plan - 12/24/20 1143     Clinical Impression Statement Patient had increase fatigue today, had increased back pain as well, her max distance today was 70 feet prior to SOB and leg fatigue, she had O2 sats that remined above 89%.  Still reports not feeling as well as she did a few weeks ago    PT Next Visit Plan work on the leg strength and her endurance, she is limited by fatigue    Consulted and Agree with Plan of Care Patient             Patient will benefit from skilled therapeutic intervention in order to improve the following deficits and impairments:  Abnormal gait, Decreased range of motion, Difficulty walking, Increased muscle spasms, Decreased activity tolerance, Pain, Decreased balance, Impaired flexibility, Improper body mechanics, Postural dysfunction, Decreased strength, Decreased mobility  Visit Diagnosis: Muscle weakness (generalized)  Risk for falls  Difficulty in walking, not elsewhere classified  Physical debility  Acute bilateral low back pain without sciatica  Muscle spasm of back  Other abnormalities of gait and mobility     Problem List Patient Active Problem List   Diagnosis Date Noted   Atypical chest pain 11/30/2020   Type II diabetes mellitus ([place])    Tracheostomy, acute management ([place])    Thyroid disease    Tachypnea    Sleep apnea    OSA treated with BiPAP    Hypertension    History of pulmonary embolism    Encounter for orogastric (OG) tube placement    Encounter for central line placement    Encounter for attention to tracheostomy ([place])    Encephalopathy    Diverticulitis of colon    Diabetes mellitus type 2 in nonobese ([place])    Benign essential HTN    Aspiration pneumonia ([place])    Acute respiratory failure with hypoxia ([place])    Abdominal pain    Snoring 10/18/2020   COPD (chronic obstructive pulmonary disease) ([place]) 07/26/2020   Pulmonary nodule  07/26/2020   Abnormal CT of the abdomen    Seizures ([place])    Hyperlipidemia    GERD (gastroesophageal reflux disease)    Diverticulosis    Depression    Complication of anesthesia    Arthritis    Hypoxia 03/23/2020   Acute cystitis with hematuria 12/19/2019   Hypomagnesemia 07/11/2017   Hypokalemia 07/10/2017   Chronic diastolic CHF (congestive heart failure) ([place]) 07/10/2017   Seizure ([place]) 07/10/2017   OSA (obstructive sleep apnea) 07/10/2017   Sepsis ([place]) 07/10/2017   Nausea vomiting and diarrhea 07/10/2017   Seizure disorder ([place]) 07/05/2017   Pulmonary hypertension ([place]) 07/05/2017   Pulmonary HTN ([place]) 07/05/2017   Anxiety state    Physical debility 06/23/2017   Status post trachelectomy 06/23/2017   History of acute respiratory failure    Status epilepticus ([place])    Acute blood loss anemia    Chronic respiratory failure with hypoxia (HCC)    Acute encephalopathy 06/06/2017   Diverticulitis of large intestine without perforation or abscess without bleeding    Non-intractable cyclical vomiting with nausea    Hypocalcemia 05/27/2017   Prolonged QT interval 05/27/2017   Colitis 05/27/2017   Central retinal vein occlusion of right eye 04/20/2016   Nuclear sclerotic cataract of both eyes 04/20/2016   Diabetic foot infection ([place]) 07/21/2015   Diabetes mellitus type 2, controlled ([place]) 07/21/2015   Cellulitis of foot, right 07/21/2015   Cellulitis of right foot 07/21/2015   Acute pulmonary embolism ([place]) 04/01/2013  DVT (deep venous thrombosis) ([place]) 04/01/2013   Concussion 04/01/2013   Essential hypertension 04/01/2013   Hypothyroidism 04/01/2013   OSA on CPAP 04/01/2013   HX: breast cancer 04/01/2013   Essential hypertension, benign 04/01/2013   Diabetes ([place]) 04/01/2013   Pulmonary embolism ([place]) 03/2013   History of breast cancer 2006   Cancer of left breast [facility]) 2006    [name] Boast., PT 12/24/2020, 11:44 AM  [place] [place], [name], [address] Phone: [phone]   Fax:  [phone]  Name: Amanda Short MRN: [id number] Date of Birth: [birth year]

## 2020-12-27 ENCOUNTER — Ambulatory Visit: Payer: Medicare PPO | Admitting: Physical Therapy

## 2020-12-28 ENCOUNTER — Telehealth (HOSPITAL_COMMUNITY): Payer: Self-pay | Admitting: Pharmacy Technician

## 2020-12-28 NOTE — Telephone Encounter (Signed)
Advanced Heart Failure Patient Advocate Encounter  Advanced Heart Failure Patient Advocate Encounter  Prior Authorization for Tyvaso (part b billing) has been approved.    Effective dates: 11/11/20 through 07/16/21  The patient was cleared to start Tyvaso on 11/12/20.  Reached out to [facility] to confirm that the patient is tolerating the new medication.  Charlann Boxer, CPhT

## 2020-12-31 ENCOUNTER — Ambulatory Visit: Payer: Medicare PPO | Admitting: Physical Therapy

## 2021-01-03 ENCOUNTER — Telehealth (HOSPITAL_COMMUNITY): Payer: Self-pay | Admitting: Pharmacy Technician

## 2021-01-03 NOTE — Telephone Encounter (Signed)
Advanced Heart Failure Patient Advocate Encounter  I received a reply that the patient has not called the nurse back with [facility], despite her having called the patient 3 times.  I called the patient a few times myself and was able to get a hold of her on her husband's phone.  The patient and her husband assured me they would be calling as soon as we hung up to set up another nursing visit.  Charlann Boxer, CPhT

## 2021-01-04 ENCOUNTER — Telehealth (HOSPITAL_COMMUNITY): Payer: Self-pay | Admitting: Pharmacy Technician

## 2021-01-04 NOTE — Telephone Encounter (Signed)
Advanced Heart Failure Patient Advocate Encounter  When speaking with the patient regarding Tyvaso, she mentioned that she was experiencing being sweaty, coughing and has a sore throat with mucus. Consulted with Jasmine, ([place]) and was able to relay information to the patient for possible relief. The following options were provided to the patient  Benadryl (plain only) -Claritin  -Coricidin HBP  -Mucinex (plain only) -Delsym Cough -Honey Cough -Robitussin (plain or DM only) -Vicks 44 cough and Vicks 44E -Vicks vapor rub -Saline nasal spray -Ziacam nasal spray   Also advised the patient to contact PCP for Covid testing. The patient's husband stated that she was taking Robitussin DM.   Wished her a quick recovery.  Charlann Boxer, CPhT

## 2021-01-10 ENCOUNTER — Encounter: Payer: Self-pay | Admitting: Physical Therapy

## 2021-01-10 ENCOUNTER — Other Ambulatory Visit: Payer: Self-pay

## 2021-01-10 ENCOUNTER — Ambulatory Visit: Payer: Medicare PPO | Admitting: Physical Therapy

## 2021-01-10 DIAGNOSIS — M545 Low back pain, unspecified: Secondary | ICD-10-CM

## 2021-01-10 DIAGNOSIS — M6281 Muscle weakness (generalized): Secondary | ICD-10-CM

## 2021-01-10 DIAGNOSIS — R262 Difficulty in walking, not elsewhere classified: Secondary | ICD-10-CM

## 2021-01-10 DIAGNOSIS — R5381 Other malaise: Secondary | ICD-10-CM

## 2021-01-10 DIAGNOSIS — Z9181 History of falling: Secondary | ICD-10-CM

## 2021-01-10 NOTE — Therapy (Signed)
[place]. [place], [name], [address] Phone: [phone]   Fax:  [phone]  Physical Therapy Treatment  Patient Details  Name: Amanda Short MRN: [id number] Date of Birth: April 04, 1941 No data recorded  Encounter Date: 01/10/2021   PT End of Session - 01/10/21 1745     Visit Number 66    Authorization Type [employer]    PT Start Time 1287    PT Stop Time 8676    PT Time Calculation (min) 43 min    Activity Tolerance Patient limited by fatigue    Behavior During Therapy [facility] for tasks assessed/performed             Past Medical History:  Diagnosis Date   Abdominal pain    Abnormal CT of the abdomen    Acute blood loss anemia    Acute cystitis with hematuria 12/19/2019   Acute encephalopathy 06/06/2017   Acute pulmonary embolism ([place]) 04/01/2013   Acute respiratory failure with hypoxia ([place])    Anxiety state    Arthritis    "back, hands" (07/22/2015)   Aspiration pneumonia ([place])    Benign essential HTN    Cancer of left breast ([place]) 2006   S/P lumpectomy   Cellulitis of foot, right 07/21/2015   Cellulitis of right foot 07/21/2015   Central retinal vein occlusion of right eye 04/20/2016   Chronic diastolic CHF (congestive heart failure) ([place]) 07/10/2017   Chronic respiratory failure with hypoxia ([place])    Colitis 72/03/4708   Complication of anesthesia    "brief breathing problem at surgery center in ~ 2005 when I had gallbladder OR"   Concussion 03/2013   due to fall   Depression    Diabetes ([place]) 04/01/2013   Diabetes mellitus type 2 in nonobese ([place])    Diabetes mellitus type 2, controlled ([place]) 07/21/2015   Diabetic foot infection ([place]) 07/21/2015   Diverticulitis of colon    Diverticulitis of large intestine without perforation or abscess without bleeding    Diverticulosis    DVT (deep venous thrombosis) ([place]) 03/2013   LLE   Encephalopathy    Encounter for attention to tracheostomy [facility])     Encounter for central line placement    Encounter for orogastric (OG) tube placement    Essential hypertension 04/01/2013   Essential hypertension, benign 04/01/2013   GERD (gastroesophageal reflux disease)    History of acute respiratory failure    History of breast cancer 2006   S/P lumpectomy   History of pulmonary embolism    HX: breast cancer 04/01/2013   Hyperlipidemia    Hypertension    Hypocalcemia 05/27/2017   Hypokalemia 07/10/2017   Hypomagnesemia 07/11/2017   Hypothyroidism    Hypoxia 03/23/2020   Nausea vomiting and diarrhea 07/10/2017   Non-intractable cyclical vomiting with nausea    Nuclear sclerotic cataract of both eyes 04/20/2016   OSA (obstructive sleep apnea) 07/10/2017   OSA on CPAP 04/01/2013   OSA treated with BiPAP    Physical debility 06/23/2017   Prolonged QT interval 05/27/2017   Pulmonary embolism ([place]) 03/2013   Pulmonary HTN ([place]) 07/05/2017   Pulmonary hypertension ([employer]) 07/05/2017   Pulmonary nodule 07/26/2020   Seizure ([place]) 07/10/2017   Seizure disorder ([place]) 07/05/2017   Seizures ([place])    Sepsis ([place]) 07/10/2017   Sleep apnea    Status epilepticus ([place])    Status post trachelectomy 06/23/2017   Tachypnea    Thyroid disease    Hypothyroid  Tracheostomy, acute management ([employer])    Type II diabetes mellitus ([place])     Past Surgical History:  Procedure Laterality Date   ABDOMINAL HYSTERECTOMY  1970s   BREAST BIOPSY Left 2006   BREAST LUMPECTOMY Left 2006   BUNIONECTOMY WITH HAMMERTOE RECONSTRUCTION Left    CARDIAC CATHETERIZATION  06/2020   ESOPHAGOGASTRODUODENOSCOPY N/A 05/29/2017   Procedure: ESOPHAGOGASTRODUODENOSCOPY (EGD);  Surgeon: Irene Shipper, MD;  Location[facility];  Service: Endoscopy;  Laterality: N/A;   JOINT REPLACEMENT     LAPAROSCOPIC CHOLECYSTECTOMY  ~ 2005   Pulmonary function test  2-3 weeks ago   RIGHT/LEFT HEART CATH AND CORONARY ANGIOGRAPHY N/A 05/24/2020   Procedure: RIGHT/LEFT HEART CATH AND CORONARY ANGIOGRAPHY;   Surgeon: Jolaine Artist, MD;  Location: [facility];  Service: Cardiovascular;  Laterality: N/A;   TOTAL HIP ARTHROPLASTY Left 2010   TOTAL KNEE ARTHROPLASTY Bilateral 2005-2006    There were no vitals filed for this visit.   Subjective Assessment - 01/10/21 1710     Subjective Patient has had some ups and downs with the change in new medication and then altering dosages, she has been sick, not sleeping well and had one fall over the past 3 weeks.  She reports that she feels like the side effects are now under control but she does report that they will change her dosage again of Friday.    Currently in Pain? No/denies                [facility] PT Assessment - 01/10/21 0001       Timed Up and Go Test   Normal TUG (seconds) 19                           [facility] Adult PT Treatment/Exercise - 01/10/21 0001       Ambulation/Gait   Gait Comments gait with HHA and using 4L O2, x 100 feet, we did this multiple times her O2 would drop to 82-84%, this is the first time in about 2 months that it has dropped below 88%.      High Level Balance   High Level Balance Comments stadning on the red tband for balance using rows and extensions      Lumbar Exercises: Aerobic   Nustep level 5 x 7 minutes                      PT Short Term Goals - 12/04/19 1627       PT SHORT TERM GOAL #1   Title independent with intial HEP    Status Achieved               PT Long Term Goals - 01/10/21 1809       PT LONG TERM GOAL #1   Title increase overall hip LE strength for functional gait and safety hips and ankles to 4+/5    Status Partially Met      PT LONG TERM GOAL #2   Title decrease TUG time to less than 14 seconds for functional gait and safety    Status Partially Met      PT LONG TERM GOAL #3   Title increase BERG to 50/56 for safety and independence    Status Partially Met      PT LONG TERM GOAL #4   Title decrease LBP 50% with activities     Status Achieved  Plan - 01/10/21 1750     Clinical Impression Statement Patient was very sporadic in her attendance with PT over the past period of approval, she has had 2 falls in the past 10 weeks, she has had times where she has had side effects from the new medication that has really caused her to not feel well and has had a cold.  Today was the first time in the past 10-12 weeks that her O2 saturation was below 88%, she was 82-84% today but with rest and pursed lip breathing we were able to get it above 92%.  She feels like she is doing better but has had the issues with the medication, Fatigue is her biggest issue, being able to walk >120 feet at times and then really struggling to walk 70 feet due to fatigue in the legs and the chest.  She has decreased her TUG time to 19 seconds and increased her berg balance score to 43/56 which should decrease her risk for falls    PT Frequency 1x / week    PT Duration 12 weeks    PT Treatment/Interventions ADLs/Self Care Home Management;Electrical Stimulation;Moist Heat;Traction;Gait training;Neuromuscular re-education;Balance training;Therapeutic exercise;Therapeutic activities;Functional mobility training;Stair training;Patient/family education;Manual techniques    PT Next Visit Plan work on the leg strength and her endurance, she is limited by fatigue    Consulted and Agree with Plan of Care Patient             Patient will benefit from skilled therapeutic intervention in order to improve the following deficits and impairments:  Abnormal gait, Decreased range of motion, Difficulty walking, Increased muscle spasms, Decreased activity tolerance, Pain, Decreased balance, Impaired flexibility, Improper body mechanics, Postural dysfunction, Decreased strength, Decreased mobility  Visit Diagnosis: Muscle weakness (generalized)  Difficulty in walking, not elsewhere classified  Physical debility  Acute bilateral low back  pain without sciatica  Risk for falls     Problem List Patient Active Problem List   Diagnosis Date Noted   Atypical chest pain 11/30/2020   Type II diabetes mellitus ([place])    Tracheostomy, acute management ([place])    Thyroid disease    Tachypnea    Sleep apnea    OSA treated with BiPAP    Hypertension    History of pulmonary embolism    Encounter for orogastric (OG) tube placement    Encounter for central line placement    Encounter for attention to tracheostomy ([place])    Encephalopathy    Diverticulitis of colon    Diabetes mellitus type 2 in nonobese (HCC)    Benign essential HTN    Aspiration pneumonia ([place])    Acute respiratory failure with hypoxia ([place])    Abdominal pain    Snoring 10/18/2020   COPD (chronic obstructive pulmonary disease) ([place]) 07/26/2020   Pulmonary nodule 07/26/2020   Abnormal CT of the abdomen    Seizures ([place])    Hyperlipidemia    GERD (gastroesophageal reflux disease)    Diverticulosis    Depression    Complication of anesthesia    Arthritis    Hypoxia 03/23/2020   Acute cystitis with hematuria 12/19/2019   Hypomagnesemia 07/11/2017   Hypokalemia 07/10/2017   Chronic diastolic CHF (congestive heart failure) ([place]) 07/10/2017   Seizure ([place]) 07/10/2017   OSA (obstructive sleep apnea) 07/10/2017   Sepsis ([place]) 07/10/2017   Nausea vomiting and diarrhea 07/10/2017   Seizure disorder ([place]) 07/05/2017   Pulmonary hypertension ([place]) 07/05/2017   Pulmonary HTN ([place]) 07/05/2017   Anxiety state  Physical debility 06/23/2017   Status post trachelectomy 06/23/2017   History of acute respiratory failure    Status epilepticus (HCC)    Acute blood loss anemia    Chronic respiratory failure with hypoxia (HCC)    Acute encephalopathy 06/06/2017   Diverticulitis of large intestine without perforation or abscess without bleeding    Non-intractable cyclical vomiting with nausea    Hypocalcemia 05/27/2017   Prolonged QT interval 05/27/2017    Colitis 05/27/2017   Central retinal vein occlusion of right eye 04/20/2016   Nuclear sclerotic cataract of both eyes 04/20/2016   Diabetic foot infection ([place]) 07/21/2015   Diabetes mellitus type 2, controlled ([place]) 07/21/2015   Cellulitis of foot, right 07/21/2015   Cellulitis of right foot 07/21/2015   Acute pulmonary embolism ([place]) 04/01/2013   DVT (deep venous thrombosis) ([place]) 04/01/2013   Concussion 04/01/2013   Essential hypertension 04/01/2013   Hypothyroidism 04/01/2013   OSA on CPAP 04/01/2013   HX: breast cancer 04/01/2013   Essential hypertension, benign 04/01/2013   Diabetes ([place]) 04/01/2013   Pulmonary embolism ([place]) 03/2013   History of breast cancer 2006   Cancer of left breast ([place]) 2006    [name] Boast., PT 01/10/2021, 6:11 PM  [place], [name], [address] Phone: [phone]   Fax:  [phone]  Name: Amanda Short MRN: [id number] Date of Birth: Jul 28, 1940

## 2021-01-19 ENCOUNTER — Other Ambulatory Visit: Payer: Self-pay

## 2021-01-19 ENCOUNTER — Encounter: Payer: Self-pay | Admitting: Physical Therapy

## 2021-01-19 ENCOUNTER — Ambulatory Visit: Payer: Medicare PPO | Attending: Family Medicine | Admitting: Physical Therapy

## 2021-01-19 DIAGNOSIS — M545 Low back pain, unspecified: Secondary | ICD-10-CM | POA: Diagnosis present

## 2021-01-19 DIAGNOSIS — M6281 Muscle weakness (generalized): Secondary | ICD-10-CM | POA: Diagnosis not present

## 2021-01-19 DIAGNOSIS — R2689 Other abnormalities of gait and mobility: Secondary | ICD-10-CM

## 2021-01-19 DIAGNOSIS — R5381 Other malaise: Secondary | ICD-10-CM

## 2021-01-19 DIAGNOSIS — M62830 Muscle spasm of back: Secondary | ICD-10-CM | POA: Diagnosis present

## 2021-01-19 DIAGNOSIS — R262 Difficulty in walking, not elsewhere classified: Secondary | ICD-10-CM | POA: Diagnosis present

## 2021-01-19 DIAGNOSIS — Z9181 History of falling: Secondary | ICD-10-CM | POA: Diagnosis present

## 2021-01-19 NOTE — Therapy (Signed)
[place]. [place], [name], [address] Phone: [phone]   Fax:  [phone]  Physical Therapy Treatment  Patient Details  Name: Amanda Short MRN: [id number] Date of Birth: 08/15/1940 No data recorded  Encounter Date: 01/19/2021   PT End of Session - 01/19/21 1630     Visit Number 69    Number of Visits 56    Date for PT Re-Evaluation 04/06/21    Authorization Type [employer]    Authorization Time Period 1 of 12    PT Start Time 1525    PT Stop Time 1612    PT Time Calculation (min) 47 min    Activity Tolerance Patient limited by fatigue    Behavior During Therapy [facility] for tasks assessed/performed             Past Medical History:  Diagnosis Date   Abdominal pain    Abnormal CT of the abdomen    Acute blood loss anemia    Acute cystitis with hematuria 12/19/2019   Acute encephalopathy 06/06/2017   Acute pulmonary embolism ([place]) 04/01/2013   Acute respiratory failure with hypoxia ([place])    Anxiety state    Arthritis    "back, hands" (07/22/2015)   Aspiration pneumonia ([place])    Benign essential HTN    Cancer of left breast ([place]) 2006   S/P lumpectomy   Cellulitis of foot, right 07/21/2015   Cellulitis of right foot 07/21/2015   Central retinal vein occlusion of right eye 04/20/2016   Chronic diastolic CHF (congestive heart failure) ([place]) 07/10/2017   Chronic respiratory failure with hypoxia ([place])    Colitis 52/48/1859   Complication of anesthesia    "brief breathing problem at surgery center in ~ 2005 when I had gallbladder OR"   Concussion 03/2013   due to fall   Depression    Diabetes ([place]) 04/01/2013   Diabetes mellitus type 2 in nonobese ([place])    Diabetes mellitus type 2, controlled ([place]) 07/21/2015   Diabetic foot infection ([place]) 07/21/2015   Diverticulitis of colon    Diverticulitis of large intestine without perforation or abscess without bleeding    Diverticulosis    DVT (deep venous thrombosis)  ([place]) 03/2013   LLE   Encephalopathy    Encounter for attention to tracheostomy ([place])    Encounter for central line placement    Encounter for orogastric (OG) tube placement    Essential hypertension 04/01/2013   Essential hypertension, benign 04/01/2013   GERD (gastroesophageal reflux disease)    History of acute respiratory failure    History of breast cancer 2006   S/P lumpectomy   History of pulmonary embolism    HX: breast cancer 04/01/2013   Hyperlipidemia    Hypertension    Hypocalcemia 05/27/2017   Hypokalemia 07/10/2017   Hypomagnesemia 07/11/2017   Hypothyroidism    Hypoxia 03/23/2020   Nausea vomiting and diarrhea 07/10/2017   Non-intractable cyclical vomiting with nausea    Nuclear sclerotic cataract of both eyes 04/20/2016   OSA (obstructive sleep apnea) 07/10/2017   OSA on CPAP 04/01/2013   OSA treated with BiPAP    Physical debility 06/23/2017   Prolonged QT interval 05/27/2017   Pulmonary embolism ([place]) 03/2013   Pulmonary HTN ([place]) 07/05/2017   Pulmonary hypertension ([place]) 07/05/2017   Pulmonary nodule 07/26/2020   Seizure ([place]) 07/10/2017   Seizure disorder ([place]) 07/05/2017   Seizures ([place])    Sepsis ([place]) 07/10/2017   Sleep apnea  Status epilepticus ([place])    Status post trachelectomy 06/23/2017   Tachypnea    Thyroid disease    Hypothyroid   Tracheostomy, acute management ([place])    Type II diabetes mellitus ([place])     Past Surgical History:  Procedure Laterality Date   ABDOMINAL HYSTERECTOMY  1970s   BREAST BIOPSY Left 2006   BREAST LUMPECTOMY Left 2006   BUNIONECTOMY WITH HAMMERTOE RECONSTRUCTION Left    CARDIAC CATHETERIZATION  06/2020   ESOPHAGOGASTRODUODENOSCOPY N/A 05/29/2017   Procedure: ESOPHAGOGASTRODUODENOSCOPY (EGD);  Surgeon: Irene Shipper, MD;  Location: [facility];  Service: Endoscopy;  Laterality: N/A;   JOINT REPLACEMENT     LAPAROSCOPIC CHOLECYSTECTOMY  ~ 2005   Pulmonary function test  2-3 weeks ago   RIGHT/LEFT HEART CATH AND  CORONARY ANGIOGRAPHY N/A 05/24/2020   Procedure: RIGHT/LEFT HEART CATH AND CORONARY ANGIOGRAPHY;  Surgeon: Jolaine Artist, MD;  Location: [facility];  Service: Cardiovascular;  Laterality: N/A;   TOTAL HIP ARTHROPLASTY Left 2010   TOTAL KNEE ARTHROPLASTY Bilateral 2005-2006    There were no vitals filed for this visit.   Subjective Assessment - 01/19/21 1539     Subjective Patient reprots that she feels like she has less issues with the side efeects of the medication, no falls, reports not walking much at home, reports that the hot weather really takes it out of her.    Currently in Pain? No/denies                               [facility] Adult PT Treatment/Exercise - 01/19/21 0001       Ambulation/Gait   Gait Comments HHA x 75', then 100 feet, then 100 feet, this would take [place] to 87% O2 saturation, finished with 2 x 75'      High Level Balance   High Level Balance Comments standing on the red tband for balance using rows and extensions, on airex 4" toe touches      Lumbar Exercises: Aerobic   Nustep level 4 x 8 minutes 400 steps      Lumbar Exercises: Standing   Other Standing Lumbar Exercises 3# hip abduction and hip extension 2x10      Lumbar Exercises: Seated   Long Arc Quad on Chair Both;2 sets;10 reps    LAQ on Chair Weights (lbs) 3#                      PT Short Term Goals - 12/04/19 1627       PT SHORT TERM GOAL #1   Title independent with intial HEP    Status Achieved               PT Long Term Goals - 01/19/21 1635       PT LONG TERM GOAL #2   Title decrease TUG time to less than 14 seconds for functional gait and safety    Status Partially Met                   Plan - 01/19/21 1631     Clinical Impression Statement Patient is reporting that she is having less complications from the medication, she is on 4L O2 at all times now.  She has a limitation the last 2 visits of about 100 feet prior to  needing to sit due to fatigue in the chest and the legs, O2 would struggle to maintain 88%  which is what the MD wants [place] to keep it at or above, it did drop to 85% wiuth the first walk, but seemed to do better with subsequent walks and it could have been due to more cues for breathing    PT Next Visit Plan work on the leg strength and her endurance, she is limited by fatigue    Consulted and Agree with Plan of Care Patient             Patient will benefit from skilled therapeutic intervention in order to improve the following deficits and impairments:  Abnormal gait, Decreased range of motion, Difficulty walking, Increased muscle spasms, Decreased activity tolerance, Pain, Decreased balance, Impaired flexibility, Improper body mechanics, Postural dysfunction, Decreased strength, Decreased mobility  Visit Diagnosis: Muscle weakness (generalized)  Difficulty in walking, not elsewhere classified  Physical debility  Acute bilateral low back pain without sciatica  Risk for falls  Muscle spasm of back  Other abnormalities of gait and mobility     Problem List Patient Active Problem List   Diagnosis Date Noted   Atypical chest pain 11/30/2020   Type II diabetes mellitus ([place])    Tracheostomy, acute management ([place])    Thyroid disease    Tachypnea    Sleep apnea    OSA treated with BiPAP    Hypertension    History of pulmonary embolism    Encounter for orogastric (OG) tube placement    Encounter for central line placement    Encounter for attention to tracheostomy ([place])    Encephalopathy    Diverticulitis of colon    Diabetes mellitus type 2 in nonobese ([place])    Benign essential HTN    Aspiration pneumonia ([place])    Acute respiratory failure with hypoxia ([place])    Abdominal pain    Snoring 10/18/2020   COPD (chronic obstructive pulmonary disease) ([place]) 07/26/2020   Pulmonary nodule 07/26/2020   Abnormal CT of the abdomen    Seizures ([place])    Hyperlipidemia    GERD  (gastroesophageal reflux disease)    Diverticulosis    Depression    Complication of anesthesia    Arthritis    Hypoxia 03/23/2020   Acute cystitis with hematuria 12/19/2019   Hypomagnesemia 07/11/2017   Hypokalemia 07/10/2017   Chronic diastolic CHF (congestive heart failure) ([place]) 07/10/2017   Seizure ([place]) 07/10/2017   OSA (obstructive sleep apnea) 07/10/2017   Sepsis ([place]) 07/10/2017   Nausea vomiting and diarrhea 07/10/2017   Seizure disorder ([place]) 07/05/2017   Pulmonary hypertension ([place]) 07/05/2017   Pulmonary HTN ([place]) 07/05/2017   Anxiety state    Physical debility 06/23/2017   Status post trachelectomy 06/23/2017   History of acute respiratory failure    Status epilepticus ([place])    Acute blood loss anemia    Chronic respiratory failure with hypoxia (HCC)    Acute encephalopathy 06/06/2017   Diverticulitis of large intestine without perforation or abscess without bleeding    Non-intractable cyclical vomiting with nausea    Hypocalcemia 05/27/2017   Prolonged QT interval 05/27/2017   Colitis 05/27/2017   Central retinal vein occlusion of right eye 04/20/2016   Nuclear sclerotic cataract of both eyes 04/20/2016   Diabetic foot infection ([place]) 07/21/2015   Diabetes mellitus type 2, controlled ([place]) 07/21/2015   Cellulitis of foot, right 07/21/2015   Cellulitis of right foot 07/21/2015   Acute pulmonary embolism ([place]) 04/01/2013   DVT (deep venous thrombosis) ([place]) 04/01/2013   Concussion 04/01/2013   Essential hypertension 04/01/2013  Hypothyroidism 04/01/2013   OSA on CPAP 04/01/2013   HX: breast cancer 04/01/2013   Essential hypertension, benign 04/01/2013   Diabetes ([place]) 04/01/2013   Pulmonary embolism ([place]) 03/2013   History of breast cancer 2006   Cancer of left breast [facility]) 2006    [name] Boast., PT 01/19/2021, 4:36 PM  [place]. [place], [name], [address] Phone: [phone]    Fax:  [phone]  Name: Amanda Short MRN: [id number] Date of Birth: Sep 15, 1940

## 2021-01-31 ENCOUNTER — Ambulatory Visit: Payer: Medicare PPO | Admitting: Cardiology

## 2021-02-02 ENCOUNTER — Ambulatory Visit: Payer: Medicare PPO | Admitting: Physical Therapy

## 2021-02-02 ENCOUNTER — Encounter: Payer: Self-pay | Admitting: Physical Therapy

## 2021-02-02 ENCOUNTER — Other Ambulatory Visit: Payer: Self-pay

## 2021-02-02 DIAGNOSIS — R2689 Other abnormalities of gait and mobility: Secondary | ICD-10-CM

## 2021-02-02 DIAGNOSIS — M6281 Muscle weakness (generalized): Secondary | ICD-10-CM | POA: Diagnosis not present

## 2021-02-02 DIAGNOSIS — M62830 Muscle spasm of back: Secondary | ICD-10-CM

## 2021-02-02 DIAGNOSIS — M545 Low back pain, unspecified: Secondary | ICD-10-CM

## 2021-02-02 DIAGNOSIS — R5381 Other malaise: Secondary | ICD-10-CM

## 2021-02-02 DIAGNOSIS — Z9181 History of falling: Secondary | ICD-10-CM

## 2021-02-02 DIAGNOSIS — R262 Difficulty in walking, not elsewhere classified: Secondary | ICD-10-CM

## 2021-02-02 NOTE — Therapy (Signed)
[place]. [place], [name], [address] Phone: [phone]   Fax:  [phone]  Physical Therapy Treatment  Patient Details  Name: Amanda Short MRN: [id number] Date of Birth: 03-18-1941 No data recorded  Encounter Date: 02/02/2021   PT End of Session - 02/02/21 1611     Visit Number 72    Number of Visits 82    Date for PT Re-Evaluation 04/06/21    Authorization Type [employer]    Authorization Time Period 2 of 12    PT Start Time 1525    PT Stop Time 1611    PT Time Calculation (min) 46 min    Activity Tolerance Patient limited by fatigue    Behavior During Therapy [facility] for tasks assessed/performed             Past Medical History:  Diagnosis Date   Abdominal pain    Abnormal CT of the abdomen    Acute blood loss anemia    Acute cystitis with hematuria 12/19/2019   Acute encephalopathy 06/06/2017   Acute pulmonary embolism ([place]) 04/01/2013   Acute respiratory failure with hypoxia ([place])    Anxiety state    Arthritis    "back, hands" (07/22/2015)   Aspiration pneumonia (HCC)    Benign essential HTN    Cancer of left breast ([place]) 2006   S/P lumpectomy   Cellulitis of foot, right 07/21/2015   Cellulitis of right foot 07/21/2015   Central retinal vein occlusion of right eye 04/20/2016   Chronic diastolic CHF (congestive heart failure) ([place]) 07/10/2017   Chronic respiratory failure with hypoxia ([employer])    Colitis 61/44/3154   Complication of anesthesia    "brief breathing problem at surgery center in ~ 2005 when I had gallbladder OR"   Concussion 03/2013   due to fall   Depression    Diabetes ([place]) 04/01/2013   Diabetes mellitus type 2 in nonobese ([place])    Diabetes mellitus type 2, controlled ([place]) 07/21/2015   Diabetic foot infection ([place]) 07/21/2015   Diverticulitis of colon    Diverticulitis of large intestine without perforation or abscess without bleeding    Diverticulosis    DVT (deep venous thrombosis)  ([place]) 03/2013   LLE   Encephalopathy    Encounter for attention to tracheostomy ([place])    Encounter for central line placement    Encounter for orogastric (OG) tube placement    Essential hypertension 04/01/2013   Essential hypertension, benign 04/01/2013   GERD (gastroesophageal reflux disease)    History of acute respiratory failure    History of breast cancer 2006   S/P lumpectomy   History of pulmonary embolism    HX: breast cancer 04/01/2013   Hyperlipidemia    Hypertension    Hypocalcemia 05/27/2017   Hypokalemia 07/10/2017   Hypomagnesemia 07/11/2017   Hypothyroidism    Hypoxia 03/23/2020   Nausea vomiting and diarrhea 07/10/2017   Non-intractable cyclical vomiting with nausea    Nuclear sclerotic cataract of both eyes 04/20/2016   OSA (obstructive sleep apnea) 07/10/2017   OSA on CPAP 04/01/2013   OSA treated with BiPAP    Physical debility 06/23/2017   Prolonged QT interval 05/27/2017   Pulmonary embolism ([place]) 03/2013   Pulmonary HTN ([place]) 07/05/2017   Pulmonary hypertension ([place]) 07/05/2017   Pulmonary nodule 07/26/2020   Seizure ([place]) 07/10/2017   Seizure disorder ([place]) 07/05/2017   Seizures ([place])    Sepsis ([place]) 07/10/2017   Sleep apnea  Status epilepticus ([place])    Status post trachelectomy 06/23/2017   Tachypnea    Thyroid disease    Hypothyroid   Tracheostomy, acute management ([place])    Type II diabetes mellitus ([place])     Past Surgical History:  Procedure Laterality Date   ABDOMINAL HYSTERECTOMY  1970s   BREAST BIOPSY Left 2006   BREAST LUMPECTOMY Left 2006   BUNIONECTOMY WITH HAMMERTOE RECONSTRUCTION Left    CARDIAC CATHETERIZATION  06/2020   ESOPHAGOGASTRODUODENOSCOPY N/A 05/29/2017   Procedure: ESOPHAGOGASTRODUODENOSCOPY (EGD);  Surgeon: Irene Shipper, MD;  Location: [facility];  Service: Endoscopy;  Laterality: N/A;   JOINT REPLACEMENT     LAPAROSCOPIC CHOLECYSTECTOMY  ~ 2005   Pulmonary function test  2-3 weeks ago   RIGHT/LEFT HEART CATH AND  CORONARY ANGIOGRAPHY N/A 05/24/2020   Procedure: RIGHT/LEFT HEART CATH AND CORONARY ANGIOGRAPHY;  Surgeon: Jolaine Artist, MD;  Location: [facility];  Service: Cardiovascular;  Laterality: N/A;   TOTAL HIP ARTHROPLASTY Left 2010   TOTAL KNEE ARTHROPLASTY Bilateral 2005-2006    There were no vitals filed for this visit.   Subjective Assessment - 02/02/21 1539     Subjective Patient has not been in for 2 weeks, no falls, she reports that she has been chanign medications and she reports some issues with this, reports HA and nausea with this.  She reports O2 at home has been 88% and above.She reports that she did some shopping but not well tolerated due to fatigue    Currently in Pain? No/denies    Pain Location Back    Aggravating Factors  reports back pain is worse in the morning or when bending over and dressing                [facility] PT Assessment - 02/02/21 0001       Timed Up and Go Test   Normal TUG (seconds) 17                           [facility] Adult PT Treatment/Exercise - 02/02/21 0001       Ambulation/Gait   Gait Comments HHA gait 110 feet then really started to lose her balance, she reported fatigue we did this multiple times and she would really struggle past 100 feet, O2 sats were 90+% complained of mostly in the legs      Therapeutic Activites    Therapeutic Activities Other Therapeutic Activities    Other Therapeutic Activities patient describes difficulty getting in the tub, they only have a tub to shower in, she cannot raise her foot up and has her husband lift the leg, we practiced this      Lumbar Exercises: Aerobic   Nustep level 5 x 6 minutes                      PT Short Term Goals - 12/04/19 1627       PT SHORT TERM GOAL #1   Title independent with intial HEP    Status Achieved               PT Long Term Goals - 02/02/21 1613       PT LONG TERM GOAL #1   Title increase overall hip LE strength for  functional gait and safety hips and ankles to 4+/5    Status Partially Met      PT LONG TERM GOAL #2   Title decrease TUG  time to less than 14 seconds for functional gait and safety    Status Partially Met                   Plan - 02/02/21 1611     Clinical Impression Statement Patient reports difficulty getting in and out of the tub, has to have husband lift her leg, we did some problem solving and practicing different ways for this for safety.  She was able to decresae her TUG time and able to walk 100-120 feet prior to stopping due to leg fatigue, O2 saturation remains high    PT Next Visit Plan work on the leg strength and her endurance, she is limited by fatigue    Consulted and Agree with Plan of Care Patient             Patient will benefit from skilled therapeutic intervention in order to improve the following deficits and impairments:  Abnormal gait, Decreased range of motion, Difficulty walking, Increased muscle spasms, Decreased activity tolerance, Pain, Decreased balance, Impaired flexibility, Improper body mechanics, Postural dysfunction, Decreased strength, Decreased mobility  Visit Diagnosis: Muscle weakness (generalized)  Difficulty in walking, not elsewhere classified  Physical debility  Acute bilateral low back pain without sciatica  Risk for falls  Muscle spasm of back  Other abnormalities of gait and mobility     Problem List Patient Active Problem List   Diagnosis Date Noted   Atypical chest pain 11/30/2020   Type II diabetes mellitus ([place])    Tracheostomy, acute management ([place])    Thyroid disease    Tachypnea    Sleep apnea    OSA treated with BiPAP    Hypertension    History of pulmonary embolism    Encounter for orogastric (OG) tube placement    Encounter for central line placement    Encounter for attention to tracheostomy ([place])    Encephalopathy    Diverticulitis of colon    Diabetes mellitus type 2 in nonobese (HCC)     Benign essential HTN    Aspiration pneumonia ([place])    Acute respiratory failure with hypoxia ([place])    Abdominal pain    Snoring 10/18/2020   COPD (chronic obstructive pulmonary disease) ([place]) 07/26/2020   Pulmonary nodule 07/26/2020   Abnormal CT of the abdomen    Seizures ([place])    Hyperlipidemia    GERD (gastroesophageal reflux disease)    Diverticulosis    Depression    Complication of anesthesia    Arthritis    Hypoxia 03/23/2020   Acute cystitis with hematuria 12/19/2019   Hypomagnesemia 07/11/2017   Hypokalemia 07/10/2017   Chronic diastolic CHF (congestive heart failure) ([place]) 07/10/2017   Seizure ([place]) 07/10/2017   OSA (obstructive sleep apnea) 07/10/2017   Sepsis ([place]) 07/10/2017   Nausea vomiting and diarrhea 07/10/2017   Seizure disorder ([place]) 07/05/2017   Pulmonary hypertension ([place]) 07/05/2017   Pulmonary HTN ([place]) 07/05/2017   Anxiety state    Physical debility 06/23/2017   Status post trachelectomy 06/23/2017   History of acute respiratory failure    Status epilepticus ([place])    Acute blood loss anemia    Chronic respiratory failure with hypoxia ([place])    Acute encephalopathy 06/06/2017   Diverticulitis of large intestine without perforation or abscess without bleeding    Non-intractable cyclical vomiting with nausea    Hypocalcemia 05/27/2017   Prolonged QT interval 05/27/2017   Colitis 05/27/2017   Central retinal vein occlusion of right eye 04/20/2016  Nuclear sclerotic cataract of both eyes 04/20/2016   Diabetic foot infection ([place]) 07/21/2015   Diabetes mellitus type 2, controlled ([place]) 07/21/2015   Cellulitis of foot, right 07/21/2015   Cellulitis of right foot 07/21/2015   Acute pulmonary embolism ([place]) 04/01/2013   DVT (deep venous thrombosis) ([place]) 04/01/2013   Concussion 04/01/2013   Essential hypertension 04/01/2013   Hypothyroidism 04/01/2013   OSA on CPAP 04/01/2013   HX: breast cancer 04/01/2013   Essential hypertension, benign  04/01/2013   Diabetes ([place]) 04/01/2013   Pulmonary embolism ([place]) 03/2013   History of breast cancer 2006   Cancer of left breast ([place]) 2006    [name] Boast., PT 02/02/2021, 4:14 PM  [place]. [place], [name], [address] Phone: [phone]   Fax:  [phone]  Name: Pyper Olexa MRN: [id number] Date of Birth: Apr 01, 1941

## 2021-02-08 ENCOUNTER — Other Ambulatory Visit: Payer: Self-pay

## 2021-02-08 ENCOUNTER — Encounter: Payer: Self-pay | Admitting: Physical Therapy

## 2021-02-08 ENCOUNTER — Ambulatory Visit: Payer: Medicare PPO | Admitting: Physical Therapy

## 2021-02-08 DIAGNOSIS — M6281 Muscle weakness (generalized): Secondary | ICD-10-CM | POA: Diagnosis not present

## 2021-02-08 DIAGNOSIS — M62830 Muscle spasm of back: Secondary | ICD-10-CM

## 2021-02-08 DIAGNOSIS — R262 Difficulty in walking, not elsewhere classified: Secondary | ICD-10-CM

## 2021-02-08 DIAGNOSIS — R5381 Other malaise: Secondary | ICD-10-CM

## 2021-02-08 DIAGNOSIS — Z9181 History of falling: Secondary | ICD-10-CM

## 2021-02-08 DIAGNOSIS — M545 Low back pain, unspecified: Secondary | ICD-10-CM

## 2021-02-08 DIAGNOSIS — R2689 Other abnormalities of gait and mobility: Secondary | ICD-10-CM

## 2021-02-08 NOTE — Therapy (Signed)
[place]. [place], [name], [address] Phone: [phone]   Fax:  [phone]  Physical Therapy Treatment  Patient Details  Name: Amanda Short MRN: [id number] Date of Birth: May 21, 1941 No data recorded  Encounter Date: 02/08/2021   PT End of Session - 02/08/21 1613     Visit Number 5    Date for PT Re-Evaluation 04/06/21    Authorization Type [employer]    Authorization Time Period 3 of 12    PT Start Time 1525    PT Stop Time 1610    PT Time Calculation (min) 45 min    Activity Tolerance Patient limited by fatigue    Behavior During Therapy [facility] for tasks assessed/performed             Past Medical History:  Diagnosis Date   Abdominal pain    Abnormal CT of the abdomen    Acute blood loss anemia    Acute cystitis with hematuria 12/19/2019   Acute encephalopathy 06/06/2017   Acute pulmonary embolism ([place]) 04/01/2013   Acute respiratory failure with hypoxia ([place])    Anxiety state    Arthritis    "back, hands" (07/22/2015)   Aspiration pneumonia (HCC)    Benign essential HTN    Cancer of left breast ([place]) 2006   S/P lumpectomy   Cellulitis of foot, right 07/21/2015   Cellulitis of right foot 07/21/2015   Central retinal vein occlusion of right eye 04/20/2016   Chronic diastolic CHF (congestive heart failure) ([place]) 07/10/2017   Chronic respiratory failure with hypoxia ([place])    Colitis 25/36/6440   Complication of anesthesia    "brief breathing problem at surgery center in ~ 2005 when I had gallbladder OR"   Concussion 03/2013   due to fall   Depression    Diabetes ([place]) 04/01/2013   Diabetes mellitus type 2 in nonobese ([place])    Diabetes mellitus type 2, controlled ([place]) 07/21/2015   Diabetic foot infection ([place]) 07/21/2015   Diverticulitis of colon    Diverticulitis of large intestine without perforation or abscess without bleeding    Diverticulosis    DVT (deep venous thrombosis) ([place]) 03/2013   LLE    Encephalopathy    Encounter for attention to tracheostomy [facility])    Encounter for central line placement    Encounter for orogastric (OG) tube placement    Essential hypertension 04/01/2013   Essential hypertension, benign 04/01/2013   GERD (gastroesophageal reflux disease)    History of acute respiratory failure    History of breast cancer 2006   S/P lumpectomy   History of pulmonary embolism    HX: breast cancer 04/01/2013   Hyperlipidemia    Hypertension    Hypocalcemia 05/27/2017   Hypokalemia 07/10/2017   Hypomagnesemia 07/11/2017   Hypothyroidism    Hypoxia 03/23/2020   Nausea vomiting and diarrhea 07/10/2017   Non-intractable cyclical vomiting with nausea    Nuclear sclerotic cataract of both eyes 04/20/2016   OSA (obstructive sleep apnea) 07/10/2017   OSA on CPAP 04/01/2013   OSA treated with BiPAP    Physical debility 06/23/2017   Prolonged QT interval 05/27/2017   Pulmonary embolism ([place]) 03/2013   Pulmonary HTN ([place]) 07/05/2017   Pulmonary hypertension ([place]) 07/05/2017   Pulmonary nodule 07/26/2020   Seizure ([place]) 07/10/2017   Seizure disorder ([place]) 07/05/2017   Seizures ([place])    Sepsis ([place]) 07/10/2017   Sleep apnea    Status epilepticus ([place])    Status  post trachelectomy 06/23/2017   Tachypnea    Thyroid disease    Hypothyroid   Tracheostomy, acute management ([place])    Type II diabetes mellitus ([place])     Past Surgical History:  Procedure Laterality Date   ABDOMINAL HYSTERECTOMY  1970s   BREAST BIOPSY Left 2006   BREAST LUMPECTOMY Left 2006   BUNIONECTOMY WITH HAMMERTOE RECONSTRUCTION Left    CARDIAC CATHETERIZATION  06/2020   ESOPHAGOGASTRODUODENOSCOPY N/A 05/29/2017   Procedure: ESOPHAGOGASTRODUODENOSCOPY (EGD);  Surgeon: Irene Shipper, MD;  Location: [facility];  Service: Endoscopy;  Laterality: N/A;   JOINT REPLACEMENT     LAPAROSCOPIC CHOLECYSTECTOMY  ~ 2005   Pulmonary function test  2-3 weeks ago   RIGHT/LEFT HEART CATH AND CORONARY ANGIOGRAPHY N/A  05/24/2020   Procedure: RIGHT/LEFT HEART CATH AND CORONARY ANGIOGRAPHY;  Surgeon: Jolaine Artist, MD;  Location: [facility];  Service: Cardiovascular;  Laterality: N/A;   TOTAL HIP ARTHROPLASTY Left 2010   TOTAL KNEE ARTHROPLASTY Bilateral 2005-2006    There were no vitals filed for this visit.   Subjective Assessment - 02/08/21 1535     Subjective Patient reports that she does not feel well, they will change medications on Friday, she reports increased leg weakness and fatigue, she is using walker in the house.  Prior to starting PT O2 saturation was 92% on 4 L O2    Currently in Pain? Yes    Pain Score 4     Pain Location Back    Pain Orientation Lower    Pain Descriptors / Indicators Sore                               [facility] Adult PT Treatment/Exercise - 02/08/21 0001       Ambulation/Gait   Gait Comments gait HHA 100 feet, rest, 80 feet, rest, 100 feet, rest and 100 feet,      Lumbar Exercises: Aerobic   Nustep level 4 x 6 minutes 300 total steps      Lumbar Exercises: Seated   Long Arc Quad on Chair Both;2 sets;10 reps    LAQ on Chair Weights (lbs) 3#    Other Seated Lumbar Exercises marching 3# 2x10 each, 3# bicep curls                      PT Short Term Goals - 12/04/19 1627       PT SHORT TERM GOAL #1   Title independent with intial HEP    Status Achieved               PT Long Term Goals - 02/08/21 1617       PT LONG TERM GOAL #1   Title increase overall hip LE strength for functional gait and safety hips and ankles to 4+/5    Status Partially Met      PT LONG TERM GOAL #2   Title decrease TUG time to less than 14 seconds for functional gait and safety    Status Partially Met                   Plan - 02/08/21 1614     Clinical Impression Statement Patient came in and O2 saturation was 92% after two walks her O2 was up to 98%.  She reports issues of not feeling well and reports that they may  change medicine this Friday.  This may also change  her overall feeling, the good thing is now it seems that the oxygen is doing better with exertion, she was still able to do multiple walks but the limit is about 100 feet due to fatigue in the chest    PT Next Visit Plan really work on her function if we can improve for a higher quality of life    Consulted and Agree with Plan of Care Patient             Patient will benefit from skilled therapeutic intervention in order to improve the following deficits and impairments:  Abnormal gait, Decreased range of motion, Difficulty walking, Increased muscle spasms, Decreased activity tolerance, Pain, Decreased balance, Impaired flexibility, Improper body mechanics, Postural dysfunction, Decreased strength, Decreased mobility  Visit Diagnosis: Muscle weakness (generalized)  Difficulty in walking, not elsewhere classified  Physical debility  Acute bilateral low back pain without sciatica  Risk for falls  Muscle spasm of back  Other abnormalities of gait and mobility     Problem List Patient Active Problem List   Diagnosis Date Noted   Atypical chest pain 11/30/2020   Type II diabetes mellitus ([place])    Tracheostomy, acute management ([place])    Thyroid disease    Tachypnea    Sleep apnea    OSA treated with BiPAP    Hypertension    History of pulmonary embolism    Encounter for orogastric (OG) tube placement    Encounter for central line placement    Encounter for attention to tracheostomy ([place])    Encephalopathy    Diverticulitis of colon    Diabetes mellitus type 2 in nonobese ([place])    Benign essential HTN    Aspiration pneumonia ([place])    Acute respiratory failure with hypoxia ([place])    Abdominal pain    Snoring 10/18/2020   COPD (chronic obstructive pulmonary disease) ([place]) 07/26/2020   Pulmonary nodule 07/26/2020   Abnormal CT of the abdomen    Seizures ([place])    Hyperlipidemia    GERD (gastroesophageal reflux disease)     Diverticulosis    Depression    Complication of anesthesia    Arthritis    Hypoxia 03/23/2020   Acute cystitis with hematuria 12/19/2019   Hypomagnesemia 07/11/2017   Hypokalemia 07/10/2017   Chronic diastolic CHF (congestive heart failure) ([place]) 07/10/2017   Seizure ([place]) 07/10/2017   OSA (obstructive sleep apnea) 07/10/2017   Sepsis ([place]) 07/10/2017   Nausea vomiting and diarrhea 07/10/2017   Seizure disorder ([place]) 07/05/2017   Pulmonary hypertension ([place]) 07/05/2017   Pulmonary HTN ([place]) 07/05/2017   Anxiety state    Physical debility 06/23/2017   Status post trachelectomy 06/23/2017   History of acute respiratory failure    Status epilepticus ([place])    Acute blood loss anemia    Chronic respiratory failure with hypoxia (HCC)    Acute encephalopathy 06/06/2017   Diverticulitis of large intestine without perforation or abscess without bleeding    Non-intractable cyclical vomiting with nausea    Hypocalcemia 05/27/2017   Prolonged QT interval 05/27/2017   Colitis 05/27/2017   Central retinal vein occlusion of right eye 04/20/2016   Nuclear sclerotic cataract of both eyes 04/20/2016   Diabetic foot infection ([place]) 07/21/2015   Diabetes mellitus type 2, controlled ([place]) 07/21/2015   Cellulitis of foot, right 07/21/2015   Cellulitis of right foot 07/21/2015   Acute pulmonary embolism ([place]) 04/01/2013   DVT (deep venous thrombosis) ([place]) 04/01/2013   Concussion 04/01/2013   Essential hypertension 04/01/2013  Hypothyroidism 04/01/2013   OSA on CPAP 04/01/2013   HX: breast cancer 04/01/2013   Essential hypertension, benign 04/01/2013   Diabetes ([place]) 04/01/2013   Pulmonary embolism ([place]) 03/2013   History of breast cancer 2006   Cancer of left breast ([place]) 2006    [name] Boast., PT 02/08/2021, 4:18 PM  [place]. [place], [name], [address] Phone: [phone]   Fax:  [phone]  Name:  Amanda Short MRN: [id number] Date of Birth: 1941-01-01

## 2021-02-14 ENCOUNTER — Other Ambulatory Visit: Payer: Self-pay

## 2021-02-14 ENCOUNTER — Other Ambulatory Visit: Payer: Self-pay | Admitting: Emergency Medicine

## 2021-02-14 ENCOUNTER — Ambulatory Visit: Payer: Medicare PPO | Attending: Family Medicine | Admitting: Physical Therapy

## 2021-02-14 ENCOUNTER — Encounter: Payer: Self-pay | Admitting: Physical Therapy

## 2021-02-14 ENCOUNTER — Other Ambulatory Visit: Payer: Self-pay | Admitting: Family Medicine

## 2021-02-14 DIAGNOSIS — R262 Difficulty in walking, not elsewhere classified: Secondary | ICD-10-CM

## 2021-02-14 DIAGNOSIS — R2689 Other abnormalities of gait and mobility: Secondary | ICD-10-CM

## 2021-02-14 DIAGNOSIS — M6281 Muscle weakness (generalized): Secondary | ICD-10-CM | POA: Diagnosis not present

## 2021-02-14 DIAGNOSIS — M545 Low back pain, unspecified: Secondary | ICD-10-CM | POA: Diagnosis present

## 2021-02-14 DIAGNOSIS — R5381 Other malaise: Secondary | ICD-10-CM

## 2021-02-14 DIAGNOSIS — M62830 Muscle spasm of back: Secondary | ICD-10-CM

## 2021-02-14 DIAGNOSIS — Z9181 History of falling: Secondary | ICD-10-CM | POA: Diagnosis present

## 2021-02-14 NOTE — Therapy (Signed)
[place]. [place], [name], [address] Phone: [phone]   Fax:  [phone]  Physical Therapy Treatment  Patient Details  Name: Amanda Short MRN: [id number] Date of Birth: 1940-08-26 No data recorded  Encounter Date: 02/14/2021   PT End of Session - 02/14/21 1619     Visit Number 30    Number of Visits 55    Date for PT Re-Evaluation 04/06/21    Authorization Type Humana    Authorization Time Period 4 of 12    PT Start Time 1526    PT Stop Time 1615    PT Time Calculation (min) 49 min    Activity Tolerance Patient tolerated treatment well    Behavior During Therapy [facility] for tasks assessed/performed             Past Medical History:  Diagnosis Date   Abdominal pain    Abnormal CT of the abdomen    Acute blood loss anemia    Acute cystitis with hematuria 12/19/2019   Acute encephalopathy 06/06/2017   Acute pulmonary embolism ([place]) 04/01/2013   Acute respiratory failure with hypoxia ([place])    Anxiety state    Arthritis    "back, hands" (07/22/2015)   Aspiration pneumonia ([place])    Benign essential HTN    Cancer of left breast ([place]) 2006   S/P lumpectomy   Cellulitis of foot, right 07/21/2015   Cellulitis of right foot 07/21/2015   Central retinal vein occlusion of right eye 04/20/2016   Chronic diastolic CHF (congestive heart failure) ([place]) 07/10/2017   Chronic respiratory failure with hypoxia ([place])    Colitis 00/16/4290   Complication of anesthesia    "brief breathing problem at surgery center in ~ 2005 when I had gallbladder OR"   Concussion 03/2013   due to fall   Depression    Diabetes ([place]) 04/01/2013   Diabetes mellitus type 2 in nonobese ([place])    Diabetes mellitus type 2, controlled ([place]) 07/21/2015   Diabetic foot infection ([place]) 07/21/2015   Diverticulitis of colon    Diverticulitis of large intestine without perforation or abscess without bleeding    Diverticulosis    DVT (deep venous  thrombosis) ([place]) 03/2013   LLE   Encephalopathy    Encounter for attention to tracheostomy ([place])    Encounter for central line placement    Encounter for orogastric (OG) tube placement    Essential hypertension 04/01/2013   Essential hypertension, benign 04/01/2013   GERD (gastroesophageal reflux disease)    History of acute respiratory failure    History of breast cancer 2006   S/P lumpectomy   History of pulmonary embolism    HX: breast cancer 04/01/2013   Hyperlipidemia    Hypertension    Hypocalcemia 05/27/2017   Hypokalemia 07/10/2017   Hypomagnesemia 07/11/2017   Hypothyroidism    Hypoxia 03/23/2020   Nausea vomiting and diarrhea 07/10/2017   Non-intractable cyclical vomiting with nausea    Nuclear sclerotic cataract of both eyes 04/20/2016   OSA (obstructive sleep apnea) 07/10/2017   OSA on CPAP 04/01/2013   OSA treated with BiPAP    Physical debility 06/23/2017   Prolonged QT interval 05/27/2017   Pulmonary embolism ([place]) 03/2013   Pulmonary HTN ([place]) 07/05/2017   Pulmonary hypertension ([place]) 07/05/2017   Pulmonary nodule 07/26/2020   Seizure ([place]) 07/10/2017   Seizure disorder ([place]) 07/05/2017   Seizures ([place])    Sepsis ([place]) 07/10/2017   Sleep apnea  Status epilepticus ([place])    Status post trachelectomy 06/23/2017   Tachypnea    Thyroid disease    Hypothyroid   Tracheostomy, acute management ([place])    Type II diabetes mellitus ([place])     Past Surgical History:  Procedure Laterality Date   ABDOMINAL HYSTERECTOMY  1970s   BREAST BIOPSY Left 2006   BREAST LUMPECTOMY Left 2006   BUNIONECTOMY WITH HAMMERTOE RECONSTRUCTION Left    CARDIAC CATHETERIZATION  06/2020   ESOPHAGOGASTRODUODENOSCOPY N/A 05/29/2017   Procedure: ESOPHAGOGASTRODUODENOSCOPY (EGD);  Surgeon: Irene Shipper, MD;  Location: [facility];  Service: Endoscopy;  Laterality: N/A;   JOINT REPLACEMENT     LAPAROSCOPIC CHOLECYSTECTOMY  ~ 2005   Pulmonary function test  2-3 weeks ago   RIGHT/LEFT HEART  CATH AND CORONARY ANGIOGRAPHY N/A 05/24/2020   Procedure: RIGHT/LEFT HEART CATH AND CORONARY ANGIOGRAPHY;  Surgeon: Jolaine Artist, MD;  Location: [facility];  Service: Cardiovascular;  Laterality: N/A;   TOTAL HIP ARTHROPLASTY Left 2010   TOTAL KNEE ARTHROPLASTY Bilateral 2005-2006    There were no vitals filed for this visit.   Subjective Assessment - 02/14/21 1533     Subjective I changed my medications, reports some increased HA and some decreased sleep    Currently in Pain? No/denies                               [facility] Adult PT Treatment/Exercise - 02/14/21 0001       Ambulation/Gait   Gait Comments HHA gait 100 feet x 4, this is about her max distance today due to lung and leg fatigue      High Level Balance   High Level Balance Comments on airex tband rows and extensions, 6" toe touches on the airex, on airex head turns and reaches      Lumbar Exercises: Aerobic   Nustep level 4  500 steps took 9.5 minutes      Lumbar Exercises: Standing   Other Standing Lumbar Exercises 3# hip abduction and hip extension 2x10, hip flexion                      PT Short Term Goals - 12/04/19 1627       PT SHORT TERM GOAL #1   Title independent with intial HEP    Status Achieved               PT Long Term Goals - 02/14/21 1622       PT LONG TERM GOAL #1   Title increase overall hip LE strength for functional gait and safety hips and ankles to 4+/5    Status Partially Met      PT LONG TERM GOAL #2   Title decrease TUG time to less than 14 seconds for functional gait and safety    Status Partially Met                   Plan - 02/14/21 1619     Clinical Impression Statement O2 prior to starting was 86% with cues for breathing it went to 88%, with walking her O2 went to 93%.  She was a little more off balance today with the airex activities.  Her tolerance to walking was about 100 feet, she would c/o fatigue in the  chest and in the legs.    PT Next Visit Plan really work on her function if we can  improve for a higher quality of life    Consulted and Agree with Plan of Care Patient             Patient will benefit from skilled therapeutic intervention in order to improve the following deficits and impairments:  Abnormal gait, Decreased range of motion, Difficulty walking, Increased muscle spasms, Decreased activity tolerance, Pain, Decreased balance, Impaired flexibility, Improper body mechanics, Postural dysfunction, Decreased strength, Decreased mobility  Visit Diagnosis: Muscle weakness (generalized)  Difficulty in walking, not elsewhere classified  Physical debility  Acute bilateral low back pain without sciatica  Risk for falls  Muscle spasm of back  Other abnormalities of gait and mobility     Problem List Patient Active Problem List   Diagnosis Date Noted   Atypical chest pain 11/30/2020   Type II diabetes mellitus ([place])    Tracheostomy, acute management ([place])    Thyroid disease    Tachypnea    Sleep apnea    OSA treated with BiPAP    Hypertension    History of pulmonary embolism    Encounter for orogastric (OG) tube placement    Encounter for central line placement    Encounter for attention to tracheostomy ([place])    Encephalopathy    Diverticulitis of colon    Diabetes mellitus type 2 in nonobese ([place])    Benign essential HTN    Aspiration pneumonia ([place])    Acute respiratory failure with hypoxia ([place])    Abdominal pain    Snoring 10/18/2020   COPD (chronic obstructive pulmonary disease) ([place]) 07/26/2020   Pulmonary nodule 07/26/2020   Abnormal CT of the abdomen    Seizures ([place])    Hyperlipidemia    GERD (gastroesophageal reflux disease)    Diverticulosis    Depression    Complication of anesthesia    Arthritis    Hypoxia 03/23/2020   Acute cystitis with hematuria 12/19/2019   Hypomagnesemia 07/11/2017   Hypokalemia 07/10/2017   Chronic diastolic CHF  (congestive heart failure) ([place]) 07/10/2017   Seizure ([place]) 07/10/2017   OSA (obstructive sleep apnea) 07/10/2017   Sepsis ([place]) 07/10/2017   Nausea vomiting and diarrhea 07/10/2017   Seizure disorder ([place]) 07/05/2017   Pulmonary hypertension ([place]) 07/05/2017   Pulmonary HTN ([place]) 07/05/2017   Anxiety state    Physical debility 06/23/2017   Status post trachelectomy 06/23/2017   History of acute respiratory failure    Status epilepticus ([place])    Acute blood loss anemia    Chronic respiratory failure with hypoxia ([place])    Acute encephalopathy 06/06/2017   Diverticulitis of large intestine without perforation or abscess without bleeding    Non-intractable cyclical vomiting with nausea    Hypocalcemia 05/27/2017   Prolonged QT interval 05/27/2017   Colitis 05/27/2017   Central retinal vein occlusion of right eye 04/20/2016   Nuclear sclerotic cataract of both eyes 04/20/2016   Diabetic foot infection ([place]) 07/21/2015   Diabetes mellitus type 2, controlled ([place]) 07/21/2015   Cellulitis of foot, right 07/21/2015   Cellulitis of right foot 07/21/2015   Acute pulmonary embolism ([place]) 04/01/2013   DVT (deep venous thrombosis) ([place]) 04/01/2013   Concussion 04/01/2013   Essential hypertension 04/01/2013   Hypothyroidism 04/01/2013   OSA on CPAP 04/01/2013   HX: breast cancer 04/01/2013   Essential hypertension, benign 04/01/2013   Diabetes ([place]) 04/01/2013   Pulmonary embolism ([place]) 03/2013   History of breast cancer 2006   Cancer of left breast ([place]) 2006    [name] Boast., PT  02/14/2021, 4:23 PM  [place]. [place], [name], [address] Phone: [phone]   Fax:  [phone]  Name: Emilea Goga MRN: [id number] Date of Birth: May 05, 1941

## 2021-02-14 NOTE — Telephone Encounter (Signed)
Refill request for:  Xarelto 15 mg LR 10/18/20, #0 , 3 rf LOV 08/27/20 FOV  none scheduled.   Please review and advise.  Thanks.  Dm/cma

## 2021-02-16 ENCOUNTER — Other Ambulatory Visit: Payer: Self-pay | Admitting: Family Medicine

## 2021-02-16 DIAGNOSIS — G4700 Insomnia, unspecified: Secondary | ICD-10-CM

## 2021-02-21 ENCOUNTER — Other Ambulatory Visit: Payer: Self-pay

## 2021-02-21 ENCOUNTER — Ambulatory Visit (HOSPITAL_COMMUNITY)
Admission: RE | Admit: 2021-02-21 | Discharge: 2021-02-21 | Disposition: A | Discharge status: Home / Self Care | Payer: Medicare PPO | Source: Ambulatory Visit | Attending: Internal Medicine | Admitting: Internal Medicine

## 2021-02-21 DIAGNOSIS — J9611 Chronic respiratory failure with hypoxia: Secondary | ICD-10-CM

## 2021-02-21 DIAGNOSIS — I5032 Chronic diastolic (congestive) heart failure: Secondary | ICD-10-CM

## 2021-02-21 DIAGNOSIS — I272 Pulmonary hypertension, unspecified: Secondary | ICD-10-CM

## 2021-02-21 NOTE — Addendum Note (Signed)
Encounter addended by: Scarlette Calico, RN on: 02/21/2021 5:13 PM  Actions taken: Order list changed, Diagnosis association updated, Clinical Note Signed

## 2021-02-21 NOTE — Progress Notes (Signed)
Heart Failure TeleHealth Note  Due to national recommendations of social distancing due to [place] 19, Audio/video telehealth visit is felt to be most appropriate for this patient at this time.  See MyChart message from today for patient consent regarding telehealth for [facility]. The patient was identified personally using two identifiers.   Date:  02/21/2021   ID:  Amanda Short, DOB November 05, 1940, MRN [id number]  Location: Home  Provider location:  [facility] Type of Visit: Established patient  PCP:  Amanda Salter, MD  Cardiologist:  None Primary HF: Amanda Short  Chief Complaint: Heart Failure follow-up   History of Present Illness:  Amanda Short is a 79 y.o. female with DM2, HTN. OSA previously on CPAP), previous smoker (quit 1995), previous DVT/PE (2014) and h/o chronic respiratory failure s/p previous tracheostomy (removed) referred by Amanda Short for further evaluation of pulmonary HTN.   In 2018 had severe n/v/d and led to intractable seizures. Got extubated but then developed recurrent distress but could not be re-intubated due to laryngeal swelling. Had emergent trach which was removed after several weeks. Smoked 1ppd x 20+ years. Quit in 1995.    Echo 7/21: LVEF 65-70% with restrictive diastolic filling  RV moderately enlarged with moderate HK. Mod to severe TR. Severe septal flattening RVSP estimated at 147mHG  CT abdomen 2/21: showed calcified granuloma in RLL VQ scan 8/21: Normal   I saw her for the first time in 10/21 and recommended R/L cath.   R/L 05/24/20 Minimal CAD (20%). Severe PAH Ao = 150/81 (109) LV =  150/18 RA =  12 RV =  94/13 PA =  92/36 (56) PCW = 11 Fick cardiac output/index = 3.0/1.8 PVR = 15.1 WU FA sat = 96% PA sat = 61%, 60%   PFTs 06/21/20 FEV1  1.11L (65%) FVC   1.65L  (71%) FEF 25-75  0.61L DLCO 35%   Post cath I recommended trial of Tyvaso but I wanted to maximize her oxygenation. O2  increased, Sleep study and Hi-res CT ordered. Referred to Pulmonary. Saw Amanda Short added for COPD   HiRes chest CT 08/18/20: suggestive of early ILD and emphysema. Also with LM and 3v coronary calcifications.   Had sleep study in 2/22/ AHI 3.6  In 4/22 started Tyvaso.    SHe presents via aEngineer, civil (consulting)for a telehealth visit today. Says she is doing alright. The hot weather has made her more SOB. She feels the Tyvaso is definitely helping particularly with her energy. Now taking 6 puffs 4x/day. Going up to 7 on Friday. Having HAs that last 2 hours otherwise well tolerated. No real GI side effects    Amanda Short denies symptoms worrisome for COVID 19.   Past Medical History:  Diagnosis Date   Abdominal pain    Abnormal CT of the abdomen    Acute blood loss anemia    Acute cystitis with hematuria 12/19/2019   Acute encephalopathy 06/06/2017   Acute pulmonary embolism ([place] 04/01/2013   Acute respiratory failure with hypoxia (HCC)    Anxiety state    Arthritis    "back, hands" (07/22/2015)   Aspiration pneumonia ([place]    Benign essential HTN    Cancer of left breast ([place] 2006   S/P lumpectomy   Cellulitis of foot, right 07/21/2015   Cellulitis of right foot 07/21/2015   Central retinal vein occlusion of right eye 04/20/2016   Chronic diastolic CHF (congestive heart failure) ([place] 07/10/2017  Chronic respiratory failure with hypoxia (HCC)    Colitis 62/70/3500   Complication of anesthesia    "brief breathing problem at surgery center in ~ 2005 when I had gallbladder OR"   Concussion 03/2013   due to fall   Depression    Diabetes ([place]) 04/01/2013   Diabetes mellitus type 2 in nonobese [facility])    Diabetes mellitus type 2, controlled ([place]) 07/21/2015   Diabetic foot infection ([place]) 07/21/2015   Diverticulitis of colon    Diverticulitis of large intestine without perforation or abscess without bleeding    Diverticulosis    DVT (deep venous thrombosis) ([place]) 03/2013    LLE   Encephalopathy    Encounter for attention to tracheostomy [facility])    Encounter for central line placement    Encounter for orogastric (OG) tube placement    Essential hypertension 04/01/2013   Essential hypertension, benign 04/01/2013   GERD (gastroesophageal reflux disease)    History of acute respiratory failure    History of breast cancer 2006   S/P lumpectomy   History of pulmonary embolism    HX: breast cancer 04/01/2013   Hyperlipidemia    Hypertension    Hypocalcemia 05/27/2017   Hypokalemia 07/10/2017   Hypomagnesemia 07/11/2017   Hypothyroidism    Hypoxia 03/23/2020   Nausea vomiting and diarrhea 07/10/2017   Non-intractable cyclical vomiting with nausea    Nuclear sclerotic cataract of both eyes 04/20/2016   OSA (obstructive sleep apnea) 07/10/2017   OSA on CPAP 04/01/2013   OSA treated with BiPAP    Physical debility 06/23/2017   Prolonged QT interval 05/27/2017   Pulmonary embolism ([place]) 03/2013   Pulmonary HTN ([place]) 07/05/2017   Pulmonary hypertension ([place]) 07/05/2017   Pulmonary nodule 07/26/2020   Seizure ([place]) 07/10/2017   Seizure disorder ([place]) 07/05/2017   Seizures ([place])    Sepsis ([place]) 07/10/2017   Sleep apnea    Status epilepticus ([place])    Status post trachelectomy 06/23/2017   Tachypnea    Thyroid disease    Hypothyroid   Tracheostomy, acute management ([place])    Type II diabetes mellitus ([place])    Past Surgical History:  Procedure Laterality Date   ABDOMINAL HYSTERECTOMY  1970s   BREAST BIOPSY Left 2006   BREAST LUMPECTOMY Left 2006   BUNIONECTOMY WITH HAMMERTOE RECONSTRUCTION Left    CARDIAC CATHETERIZATION  06/2020   ESOPHAGOGASTRODUODENOSCOPY N/A 05/29/2017   Procedure: ESOPHAGOGASTRODUODENOSCOPY (EGD);  Surgeon: Amanda Shipper, MD;  Location: [facility];  Service: Endoscopy;  Laterality: N/A;   JOINT REPLACEMENT     LAPAROSCOPIC CHOLECYSTECTOMY  ~ 2005   Pulmonary function test  2-3 weeks ago   RIGHT/LEFT HEART CATH AND CORONARY ANGIOGRAPHY  N/A 05/24/2020   Procedure: RIGHT/LEFT HEART CATH AND CORONARY ANGIOGRAPHY;  Surgeon: Jolaine Artist, MD;  Location: [facility];  Service: Cardiovascular;  Laterality: N/A;   TOTAL HIP ARTHROPLASTY Left 2010   TOTAL KNEE ARTHROPLASTY Bilateral 2005-2006     Current Outpatient Medications  Medication Sig Dispense Refill   acetaminophen (TYLENOL) 500 MG tablet Take 500-1,000 mg by mouth every 6 (six) hours as needed for mild pain.      ALPRAZolam (XANAX) 0.5 MG tablet Take 1 tablet (0.5 mg total) by mouth at bedtime as needed for sleep. 30 tablet 0   atorvastatin (LIPITOR) 20 MG tablet Take 20 mg by mouth daily.     benzonatate (TESSALON PERLES) 100 MG capsule Take 1 capsule (100 mg total) by mouth 3 (three) times daily as  needed. (Patient taking differently: Take 100 mg by mouth 3 (three) times daily as needed for cough.) 20 capsule 0   calcium-vitamin D (OSCAL WITH D) 500-200 MG-UNIT tablet Take 1 tablet by mouth 2 (two) times daily.     diltiazem (CARDIZEM) 30 MG tablet Take 30 mg by mouth 2 (two) times daily.      DULoxetine (CYMBALTA) 60 MG capsule Take 1 capsule (60 mg total) by mouth daily. 90 capsule 3   febuxostat (ULORIC) 40 MG tablet Take 40 mg daily by mouth.     furosemide (LASIX) 40 MG tablet Take 40 mg by mouth daily.     insulin degludec (TRESIBA FLEXTOUCH) 100 UNIT/ML SOPN FlexTouch Pen Inject 24 Units into the skin daily.     levETIRAcetam (KEPPRA) 500 MG tablet Take 500 mg by mouth 2 (two) times daily.     levothyroxine (SYNTHROID) 100 MCG tablet Take 1 tablet (100 mcg total) by mouth daily before breakfast. 90 tablet 3   loratadine (CLARITIN) 10 MG tablet Take 10 mg by mouth daily as needed for allergies.     metoprolol succinate (TOPROL XL) 50 MG 24 hr tablet Take 1 tablet (50 mg total) by mouth daily. Take with or immediately following a meal. 90 tablet 3   montelukast (SINGULAIR) 10 MG tablet Take 10 mg at bedtime by mouth.     Multiple Vitamin (MULTIVITAMIN  WITH MINERALS) TABS tablet Take 1 tablet by mouth daily. Unknown strengfth     nateglinide (STARLIX) 120 MG tablet Take 120 mg by mouth daily.      omeprazole (PRILOSEC) 40 MG capsule Take 40 mg by mouth 2 (two) times daily.     OXYGEN Inhale 4 L/min into the lungs as directed. 24 hours a day     spironolactone (ALDACTONE) 25 MG tablet Take 12.5 mg by mouth daily.     Short RESPIMAT 2.5-2.5 MCG/ACT AERS INHALE 2 PUFFS BY MOUTH INTO THE LUNGS DAILY 1 each 5   Treprostinil (TYVASO) 0.6 MG/ML SOLN Inhale 18 mcg into the lungs in the morning, at noon, in the evening, and at bedtime.     XARELTO 15 MG TABS tablet TAKE 1 TABLET BY MOUTH EVERY DAY 90 tablet 3   No current facility-administered medications for this encounter.    Allergies:   Allopurinol and Aspirin   Social History:  The patient  reports that she quit smoking about 27 years ago. Her smoking use included cigarettes. She has a 30.00 pack-year smoking history. She has never used smokeless tobacco. She reports that she does not drink alcohol and does not use drugs.   Family History:  The patient's family history includes Breast cancer in her cousin and sister; Colon cancer in her maternal grandmother; Diabetes in her sister and another family member; Hypertension in her mother; Kidney disease in her sister; Liver cancer in her mother; Uterine cancer in her mother.   ROS:  Please see the history of present illness.   All other systems are personally reviewed and negative.   BP    125/87 HR 67  O2 sat: 87% Exam:  (Video/Tele Health Call; Exam is subjective and or/visual.) General:  Speaks in full sentences. No resp difficulty. Lungs: Normal respiratory effort with conversation.  Abdomen: Non-distended per patient report Extremities: Pt denies edema. Neuro: Alert & oriented x 3.   Recent Labs: 05/14/2020: Platelets 248 05/24/2020: Hemoglobin 13.9 08/27/2020: BUN 37; Creat 1.45; Potassium 4.8; Sodium 140; TSH 0.99  Personally  reviewed   Wt  Readings from Last 3 Encounters:  11/30/20 66.7 kg (147 lb)  10/21/20 67.5 kg (148 lb 12.8 oz)  10/18/20 71.2 kg (157 lb)      ASSESSMENT AND PLAN:  1. Pulmonary arterial HTN with cor pulmonale, severe - Echo 7/21: LVEF 65-70% with restrictive diastolic filling  RV moderately enlarged with moderate HK. Mod to severe TR. Severe septal flattening RVSP estimated at 108. - CT abdomen 2/21: showed calcified granuloma in RLL - VQ scan 8/21: Normal - RHC 11/21 with severe PAH PA =  92/36 (56) PCW = 11 CI 1.8 PVR 15 - Continue supplemental O2. Now wearing 4L O2 with improved saturations - PFTs 12/21 show mild to moderate obstruction but no hyperinflation. - Auto-immune serology negative - HiRes chest CT 08/18/20: suggestive of early ILD and emphysema. 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 - She has benefited Short and increased O2 supplementation. - Started Tyvaso in 4/22 feels some better. Will continue to titrate as SEs permit. Can take Tylenol to help with HAs - See back in 2 months with repeat Echo  - We discussed Pulmonary Rehab would like to defer for now. I told her I would keep bugging her about it   2. Chronic hypoxic respiratory failure - quit tobacco 1995 - on home O2 @ 4L encouraged her to wear O2 and check sats to keep > 90% - has h/o tracheostomy - PFTs 12/21 show mild to moderate obstruction but no hyperinflation. - HiRes chest CT 08/18/20: suggestive of early ILD and emphysema. Also with LM and 3v coronary calcifications, - Following with Dr. Lamonte Sakai in Pulmonary. Has benefitted from [place] - Continue to titrateTyvaso    3. OSA - AH 2/2 3.6 -> no significant OSA    COVID  screen The patient does not have any symptoms that suggest any further testing/ screening at this time.  Social distancing reinforced today.  Recommended follow-up:  As above  Relevant cardiac medications were reviewed at length with the patient today.   The patient does not have concerns regarding their medications at this time.   The following changes were made today:  As above  Today, I have spent 14 minutes with the patient with telehealth technology discussing the above issues .    Signed, Glori Bickers, MD  02/21/2021 1:34 PM  [facility] [address] [place] [address] [phone] (office) [phone] (fax)

## 2021-02-21 NOTE — Patient Instructions (Signed)
Continue current medications  Your physician recommends that you schedule a follow-up appointment in: 2-3 months with echocardiogram, our office will call you to schedule this  If you have any questions or concerns before your next appointment please send [place] a message through [place] or call our office at [phone].    TO LEAVE A MESSAGE FOR THE NURSE SELECT OPTION 2, PLEASE LEAVE A MESSAGE INCLUDING: YOUR NAME DATE OF BIRTH CALL BACK NUMBER REASON FOR CALL**this is important as we prioritize the call backs  YOU WILL RECEIVE A CALL BACK THE SAME DAY AS LONG AS YOU CALL BEFORE 4:00 PM  milAt the [facility], you and your health needs are our priority. As part of our continuing mission to provide you with exceptional heart care, we have created designated Provider Care Teams. These Care Teams include your primary Cardiologist (physician) and Advanced Practice Providers (APPs- Physician Assistants and Nurse Practitioners) who all work together to provide you with the care you need, when you need it.   You may see any of the following providers on your designated Care Team at your next follow up: Dr Glori Bickers Dr Loralie Champagne Dr Patrice Paradise, NP Lyda Jester, [place] Ginnie Smart Audry Riles, PharmD   Please be sure to bring in all your medications bottles to every appointment.

## 2021-02-21 NOTE — Progress Notes (Signed)
  Bensimhon, Shaune Pascal, MD  You 3 hours ago (1:44 PM)     F/u appt 2-3 months with echo please       Orders placed, sent to schedulers to arrange echo and f/u

## 2021-02-22 ENCOUNTER — Ambulatory Visit: Payer: Medicare PPO | Admitting: Physical Therapy

## 2021-03-01 ENCOUNTER — Ambulatory Visit: Payer: Medicare PPO | Admitting: Physical Therapy

## 2021-03-08 ENCOUNTER — Other Ambulatory Visit: Payer: Self-pay

## 2021-03-08 ENCOUNTER — Ambulatory Visit: Payer: Medicare PPO | Admitting: Physical Therapy

## 2021-03-08 ENCOUNTER — Encounter: Payer: Self-pay | Admitting: Physical Therapy

## 2021-03-08 DIAGNOSIS — Z9181 History of falling: Secondary | ICD-10-CM

## 2021-03-08 DIAGNOSIS — M6281 Muscle weakness (generalized): Secondary | ICD-10-CM | POA: Diagnosis not present

## 2021-03-08 DIAGNOSIS — M62830 Muscle spasm of back: Secondary | ICD-10-CM

## 2021-03-08 DIAGNOSIS — R262 Difficulty in walking, not elsewhere classified: Secondary | ICD-10-CM

## 2021-03-08 DIAGNOSIS — R5381 Other malaise: Secondary | ICD-10-CM

## 2021-03-08 DIAGNOSIS — M545 Low back pain, unspecified: Secondary | ICD-10-CM

## 2021-03-08 DIAGNOSIS — R2689 Other abnormalities of gait and mobility: Secondary | ICD-10-CM

## 2021-03-08 NOTE — Therapy (Signed)
[place], [name], [address] Phone: [phone]   Fax:  [phone]  Physical Therapy Treatment  Patient Details  Name: Amanda Short MRN: [id number] Date of Birth: [birth year] No data recorded  Encounter Date: 03/08/2021   PT End of Session - 03/08/21 1721     Visit Number 53    Number of Visits 53    Date for PT Re-Evaluation 04/06/21    Authorization Type [employer]    Authorization Time Period 5 of 12    PT Start Time 1526    PT Stop Time 1614    PT Time Calculation (min) 48 min    Activity Tolerance Patient limited by fatigue    Behavior During Therapy [facility] for tasks assessed/performed             Past Medical History:  Diagnosis Date   Abdominal pain    Abnormal CT of the abdomen    Acute blood loss anemia    Acute cystitis with hematuria 12/19/2019   Acute encephalopathy 06/06/2017   Acute pulmonary embolism ([place]) 04/01/2013   Acute respiratory failure with hypoxia ([place])    Anxiety state    Arthritis    "back, hands" (07/22/2015)   Aspiration pneumonia ([place])    Benign essential HTN    Cancer of left breast ([place]) 2006   S/P lumpectomy   Cellulitis of foot, right 07/21/2015   Cellulitis of right foot 07/21/2015   Central retinal vein occlusion of right eye 04/20/2016   Chronic diastolic CHF (congestive heart failure) ([place]) 07/10/2017   Chronic respiratory failure with hypoxia ([place])    Colitis 38/18/2993   Complication of anesthesia    "brief breathing problem at surgery center in ~ 2005 when I had gallbladder OR"   Concussion 03/2013   due to fall   Depression    Diabetes ([place]) 04/01/2013   Diabetes mellitus type 2 in nonobese ([place])    Diabetes mellitus type 2, controlled ([place]) 07/21/2015   Diabetic foot infection ([place]) 07/21/2015   Diverticulitis of colon    Diverticulitis of large intestine without perforation or abscess without bleeding    Diverticulosis    DVT (deep venous thrombosis)  ([place]) 03/2013   LLE   Encephalopathy    Encounter for attention to tracheostomy ([place])    Encounter for central line placement    Encounter for orogastric (OG) tube placement    Essential hypertension 04/01/2013   Essential hypertension, benign 04/01/2013   GERD (gastroesophageal reflux disease)    History of acute respiratory failure    History of breast cancer 2006   S/P lumpectomy   History of pulmonary embolism    HX: breast cancer 04/01/2013   Hyperlipidemia    Hypertension    Hypocalcemia 05/27/2017   Hypokalemia 07/10/2017   Hypomagnesemia 07/11/2017   Hypothyroidism    Hypoxia 03/23/2020   Nausea vomiting and diarrhea 07/10/2017   Non-intractable cyclical vomiting with nausea    Nuclear sclerotic cataract of both eyes 04/20/2016   OSA (obstructive sleep apnea) 07/10/2017   OSA on CPAP 04/01/2013   OSA treated with BiPAP    Physical debility 06/23/2017   Prolonged QT interval 05/27/2017   Pulmonary embolism ([place]) 03/2013   Pulmonary HTN ([place]) 07/05/2017   Pulmonary hypertension ([place]) 07/05/2017   Pulmonary nodule 07/26/2020   Seizure ([place]) 07/10/2017   Seizure disorder ([place]) 07/05/2017   Seizures ([place])    Sepsis ([place]) 07/10/2017   Sleep apnea  Status epilepticus ([place])    Status post trachelectomy 06/23/2017   Tachypnea    Thyroid disease    Hypothyroid   Tracheostomy, acute management ([place])    Type II diabetes mellitus ([place])     Past Surgical History:  Procedure Laterality Date   ABDOMINAL HYSTERECTOMY  1970s   BREAST BIOPSY Left 2006   BREAST LUMPECTOMY Left 2006   BUNIONECTOMY WITH HAMMERTOE RECONSTRUCTION Left    CARDIAC CATHETERIZATION  06/2020   ESOPHAGOGASTRODUODENOSCOPY N/A 05/29/2017   Procedure: ESOPHAGOGASTRODUODENOSCOPY (EGD);  Surgeon: Irene Shipper, MD;  Location: [facility];  Service: Endoscopy;  Laterality: N/A;   JOINT REPLACEMENT     LAPAROSCOPIC CHOLECYSTECTOMY  ~ 2005   Pulmonary function test  2-3 weeks ago   RIGHT/LEFT HEART CATH AND  CORONARY ANGIOGRAPHY N/A 05/24/2020   Procedure: RIGHT/LEFT HEART CATH AND CORONARY ANGIOGRAPHY;  Surgeon: Jolaine Artist, MD;  Location: [facility];  Service: Cardiovascular;  Laterality: N/A;   TOTAL HIP ARTHROPLASTY Left 2010   TOTAL KNEE ARTHROPLASTY Bilateral 2005-2006    There were no vitals filed for this visit.   Subjective Assessment - 03/08/21 1554     Subjective Patient has been having significant issues with her medicine and having HA and not sleeping as well as fatigue, I had to cancel some appointments due to not feeling well    Currently in Pain? Yes    Pain Score 4     Pain Location Back    Pain Orientation Lower    Pain Descriptors / Indicators Sharp;Shooting    Aggravating Factors  twisting                [facility] PT Assessment - 03/08/21 0001       Timed Up and Go Test   Normal TUG (seconds) 18                           [facility] Adult PT Treatment/Exercise - 03/08/21 0001       Ambulation/Gait   Gait Comments gait with HHA 3x 110 feet, everytime we walked the O2 saturation went up      High Level Balance   High Level Balance Comments sidestepping      Lumbar Exercises: Aerobic   UBE (Upper Arm Bike) level 4 x 2 minutes    Nustep level 4 x 6 minutes      Lumbar Exercises: Seated   Long Arc Quad on Chair Both;2 sets;10 reps    LAQ on Chair Weights (lbs) 3#    Other Seated Lumbar Exercises marching 3# 2x10 each, 3# bicep curls                      PT Short Term Goals - 12/04/19 1627       PT SHORT TERM GOAL #1   Title independent with intial HEP    Status Achieved               PT Long Term Goals - 03/08/21 1726       PT LONG TERM GOAL #1   Title increase overall hip LE strength for functional gait and safety hips and ankles to 4+/5    Status Partially Met      PT LONG TERM GOAL #2   Title decrease TUG time to less than 14 seconds for functional gait and safety      PT LONG TERM GOAL #3    Title  increase BERG to 50/56 for safety and independence    Status Partially Met                   Plan - 03/08/21 1721     Clinical Impression Statement Patient has missed the pst three weeks, I intentionally asked for the visits and a very long period of time due to her difficulty breathing, the past three weeks she reports increased dosage of medicine and having bad HA and not sleeping well.  She reports that she is talking to the MD's about this and trying to see if she has reached her max dose.  I took her O2 saturation when she came in and it was 86% on 4 L O2.  After doing some walking with me and exerciseing it was 90-96% with her exerting and making her breathe, I encouraged her to walk with her husband at home for this reason of getting better oxygen exchange with exercise and walking.  We also looked together at different equipment for shower as her and her husband report great difficulty getting in and out of the shower.  Looing at benches, she reports that she used to have a bench but she did not like it.  Currently husband lifts her legs up and over the tub    PT Next Visit Plan seems to be doing okay with her function and oxygen, still very fatigued and reports not doing much at home.    Consulted and Agree with Plan of Care Patient;Family member/caregiver    Family Member Consulted husband Sid             Patient will benefit from skilled therapeutic intervention in order to improve the following deficits and impairments:  Abnormal gait, Difficulty walking, Increased muscle spasms, Decreased activity tolerance, Pain, Decreased balance, Impaired flexibility, Improper body mechanics, Postural dysfunction, Decreased strength, Decreased mobility  Visit Diagnosis: Muscle weakness (generalized)  Difficulty in walking, not elsewhere classified  Physical debility  Acute bilateral low back pain without sciatica  Risk for falls  Muscle spasm of back  Other  abnormalities of gait and mobility     Problem List Patient Active Problem List   Diagnosis Date Noted   Atypical chest pain 11/30/2020   Type II diabetes mellitus ([place])    Tracheostomy, acute management ([place])    Thyroid disease    Tachypnea    Sleep apnea    OSA treated with BiPAP    Hypertension    History of pulmonary embolism    Encounter for orogastric (OG) tube placement    Encounter for central line placement    Encounter for attention to tracheostomy ([place])    Encephalopathy    Diverticulitis of colon    Diabetes mellitus type 2 in nonobese ([place])    Benign essential HTN    Aspiration pneumonia ([place])    Acute respiratory failure with hypoxia ([place])    Abdominal pain    Snoring 10/18/2020   COPD (chronic obstructive pulmonary disease) ([place]) 07/26/2020   Pulmonary nodule 07/26/2020   Abnormal CT of the abdomen    Seizures ([place])    Hyperlipidemia    GERD (gastroesophageal reflux disease)    Diverticulosis    Depression    Complication of anesthesia    Arthritis    Hypoxia 03/23/2020   Acute cystitis with hematuria 12/19/2019   Hypomagnesemia 07/11/2017   Hypokalemia 07/10/2017   Chronic diastolic CHF (congestive heart failure) ([place]) 07/10/2017   Seizure ([place]) 07/10/2017  OSA (obstructive sleep apnea) 07/10/2017   Sepsis ([place]) 07/10/2017   Nausea vomiting and diarrhea 07/10/2017   Seizure disorder ([place]) 07/05/2017   Pulmonary hypertension ([place]) 07/05/2017   Pulmonary HTN ([place]) 07/05/2017   Anxiety state    Physical debility 06/23/2017   Status post trachelectomy 06/23/2017   History of acute respiratory failure    Status epilepticus ([place])    Acute blood loss anemia    Chronic respiratory failure with hypoxia ([place])    Acute encephalopathy 06/06/2017   Diverticulitis of large intestine without perforation or abscess without bleeding    Non-intractable cyclical vomiting with nausea    Hypocalcemia 05/27/2017   Prolonged QT interval 05/27/2017   Colitis  05/27/2017   Central retinal vein occlusion of right eye 04/20/2016   Nuclear sclerotic cataract of both eyes 04/20/2016   Diabetic foot infection ([place]) 07/21/2015   Diabetes mellitus type 2, controlled ([place]) 07/21/2015   Cellulitis of foot, right 07/21/2015   Cellulitis of right foot 07/21/2015   Acute pulmonary embolism ([place]) 04/01/2013   DVT (deep venous thrombosis) ([place]) 04/01/2013   Concussion 04/01/2013   Essential hypertension 04/01/2013   Hypothyroidism 04/01/2013   OSA on CPAP 04/01/2013   HX: breast cancer 04/01/2013   Essential hypertension, benign 04/01/2013   Diabetes ([place]) 04/01/2013   Pulmonary embolism ([place]) 03/2013   History of breast cancer 2006   Cancer of left breast ([place]) 2006    [name] Boast., PT 03/08/2021, 5:27 PM  [place] [place], [name], [address] Phone: [phone]   Fax:  [phone]  Name: Amanda Short MRN: [id number] Date of Birth: Apr 18, 1941

## 2021-03-15 ENCOUNTER — Encounter: Payer: Self-pay | Admitting: Physical Therapy

## 2021-03-15 ENCOUNTER — Ambulatory Visit: Payer: Medicare PPO | Admitting: Physical Therapy

## 2021-03-16 ENCOUNTER — Other Ambulatory Visit: Payer: Self-pay | Admitting: Family Medicine

## 2021-03-16 DIAGNOSIS — G4700 Insomnia, unspecified: Secondary | ICD-10-CM

## 2021-03-16 NOTE — Telephone Encounter (Signed)
refill request for: Alprazolam 0.5 mg LR  02/16/21,#30, 0 rf(Dr Kremer) LOV  08/27/20 FOV  none scheduled.    Lease review and advise. Thanks.  Dm/cma

## 2021-03-17 NOTE — Telephone Encounter (Signed)
Spoke to patient and scheduled her an appointment at 11:30 to f/u meds.  Dm/cma

## 2021-03-18 ENCOUNTER — Ambulatory Visit (INDEPENDENT_AMBULATORY_CARE_PROVIDER_SITE_OTHER): Payer: Medicare PPO | Admitting: Family Medicine

## 2021-03-18 ENCOUNTER — Encounter: Payer: Self-pay | Admitting: Family Medicine

## 2021-03-18 ENCOUNTER — Other Ambulatory Visit: Payer: Self-pay

## 2021-03-18 VITALS — BP 118/84 | HR 63 | Temp 96.3°F | Ht 61.0 in | Wt 150.6 lb

## 2021-03-18 DIAGNOSIS — I272 Pulmonary hypertension, unspecified: Secondary | ICD-10-CM | POA: Diagnosis not present

## 2021-03-18 DIAGNOSIS — N182 Chronic kidney disease, stage 2 (mild): Secondary | ICD-10-CM

## 2021-03-18 DIAGNOSIS — I10 Essential (primary) hypertension: Secondary | ICD-10-CM

## 2021-03-18 DIAGNOSIS — G4700 Insomnia, unspecified: Secondary | ICD-10-CM | POA: Insufficient documentation

## 2021-03-18 DIAGNOSIS — E1122 Type 2 diabetes mellitus with diabetic chronic kidney disease: Secondary | ICD-10-CM

## 2021-03-18 DIAGNOSIS — F5101 Primary insomnia: Secondary | ICD-10-CM | POA: Diagnosis not present

## 2021-03-18 DIAGNOSIS — E039 Hypothyroidism, unspecified: Secondary | ICD-10-CM

## 2021-03-18 DIAGNOSIS — E785 Hyperlipidemia, unspecified: Secondary | ICD-10-CM

## 2021-03-18 MED ORDER — ALPRAZOLAM 0.5 MG PO TABS: 0.5000 mg | ORAL_TABLET | Freq: Every evening | ORAL | 0 refills | Status: DC | PRN

## 2021-03-18 NOTE — Progress Notes (Signed)
[facility] [address] [place] [address] Dept: [phone] Dept Fax: [phone]  Chronic Care Office Visit  Subjective:    Patient ID: Amanda Short, female    DOB: 07/11/1941, 79 y.o..   MRN: [id number]  Chief Complaint  Patient presents with   Follow-up    6 mo f/u. Med recheck    History of Present Illness:  Patient is in today for reassessment of chronic medical issues.  Amanda Short was last seen by me in Feb. 2022. She notes that her health has been stable since that time. She continues to follow with Dr. Haroldine Laws related to her pulmonary hypertension. She is currently on Tyvaso (treprostinil). She is taking 7 puffs via nebulizer four times a day. She has had some headache and GI upset, which can be common side effects and may limit increasing her dose further. She conues on Stiolto  Amanda Short a history of heart failure (managed with metoprololand spironolactone), hypertension, Type 2 diabetes (managed with neteglinide and insulin degludec), and stage 3 CKD. She has hyperlipidemia (on atorvastatin). and hypothyroidism (on levothyroxine). She has ahd past DVT and ius on Xarelto.  Amanda Short  has a history of using Xanax at bedtime. She notes this was started in the hospital due to anxiety, but has been continued long-term to assist with sleep. She has been on a stable dose.  Past Medical History: Patient Active Problem List   Diagnosis Date Noted   Atypical chest pain 11/30/2020   History of pulmonary embolism    Encounter for orogastric (OG) tube placement    Abdominal pain    COPD (chronic obstructive pulmonary disease) ([place]) 07/26/2020   Pulmonary nodule 07/26/2020   Abnormal CT of the abdomen    Hyperlipidemia    GERD (gastroesophageal reflux disease)    Diverticulosis    Depression    Complication of anesthesia    Arthritis    Acute cystitis with hematuria 12/19/2019   Hypomagnesemia  07/11/2017   Hypokalemia 07/10/2017   Chronic diastolic CHF (congestive heart failure) ([place]) 07/10/2017   OSA (obstructive sleep apnea) 07/10/2017   Seizure disorder ([place]) 07/05/2017   Pulmonary hypertension ([place]) 07/05/2017   Anxiety state    Physical debility 06/23/2017   Status post trachelectomy 06/23/2017   History of acute respiratory failure    Status epilepticus (H[facility])    Acute blood loss anemia    Chronic respiratory failure with hypoxia ([facility])    Acute encephalopathy 06/06/2017   Diverticulitis of large intestine without perforation or abscess without bleeding    Non-intractable cyclical vomiting with nausea    Hypocalcemia 05/27/2017   Prolonged QT interval 05/27/2017   Colitis 05/27/2017   Central retinal vein occlusion of right eye 04/20/2016   Nuclear sclerotic cataract of both eyes 04/20/2016   Diabetic foot infection ([place]) 07/21/2015   Diabetes mellitus type 2, controlled ([place]) 07/21/2015   DVT (deep venous thrombosis) ([place]) 04/01/2013   Essential hypertension 04/01/2013   Hypothyroidism 04/01/2013   HX: breast cancer 04/01/2013   Pulmonary embolism ([place]) 03/2013   History of breast cancer 2006   Cancer of left breast ([place]) 2006   Past Surgical History:  Procedure Laterality Date   ABDOMINAL HYSTERECTOMY  1970s   BREAST BIOPSY Left 2006   BREAST LUMPECTOMY Left 2006   BUNIONECTOMY WITH HAMMERTOE RECONSTRUCTION Left    CARDIAC CATHETERIZATION  06/2020   ESOPHAGOGASTRODUODENOSCOPY N/A 05/29/2017   Procedure: ESOPHAGOGASTRODUODENOSCOPY (EGD);  Surgeon: Irene Shipper, MD;  Location:  [facility];  Service: Endoscopy;  Laterality: N/A;   JOINT REPLACEMENT     LAPAROSCOPIC CHOLECYSTECTOMY  ~ 2005   Pulmonary function test  2-3 weeks ago   RIGHT/LEFT HEART CATH AND CORONARY ANGIOGRAPHY N/A 05/24/2020   Procedure: RIGHT/LEFT HEART CATH AND CORONARY ANGIOGRAPHY;  Surgeon: Jolaine Artist, MD;  Location: [facility];  Service: Cardiovascular;  Laterality:  N/A;   TOTAL HIP ARTHROPLASTY Left 2010   TOTAL KNEE ARTHROPLASTY Bilateral 2005-2006   Family History  Problem Relation Age of Onset   Diabetes Other    Breast cancer Sister    Liver cancer Mother    Uterine cancer Mother    Hypertension Mother    Colon cancer Maternal Grandmother    Breast cancer Cousin        couple   Kidney disease Sister    Diabetes Sister    Outpatient Medications Prior to Visit  Medication Sig Dispense Refill   acetaminophen (TYLENOL) 500 MG tablet Take 500-1,000 mg by mouth every 6 (six) hours as needed for mild pain.      atorvastatin (LIPITOR) 20 MG tablet Take 20 mg by mouth daily.     benzonatate (TESSALON PERLES) 100 MG capsule Take 1 capsule (100 mg total) by mouth 3 (three) times daily as needed. (Patient taking differently: Take 100 mg by mouth 3 (three) times daily as needed for cough.) 20 capsule 0   calcium-vitamin D (OSCAL WITH D) 500-200 MG-UNIT tablet Take 1 tablet by mouth 2 (two) times daily.     diltiazem (CARDIZEM) 30 MG tablet Take 30 mg by mouth 2 (two) times daily.      DULoxetine (CYMBALTA) 60 MG capsule Take 1 capsule (60 mg total) by mouth daily. 90 capsule 3   febuxostat (ULORIC) 40 MG tablet Take 40 mg daily by mouth.     furosemide (LASIX) 40 MG tablet Take 40 mg by mouth daily.     insulin degludec (TRESIBA FLEXTOUCH) 100 UNIT/ML SOPN FlexTouch Pen Inject 24 Units into the skin daily.     levETIRAcetam (KEPPRA) 500 MG tablet Take 500 mg by mouth 2 (two) times daily.     levothyroxine (SYNTHROID) 100 MCG tablet Take 1 tablet (100 mcg total) by mouth daily before breakfast. 90 tablet 3   loratadine (CLARITIN) 10 MG tablet Take 10 mg by mouth daily as needed for allergies.     metoprolol succinate (TOPROL XL) 50 MG 24 hr tablet Take 1 tablet (50 mg total) by mouth daily. Take with or immediately following a meal. 90 tablet 3   montelukast (SINGULAIR) 10 MG tablet Take 10 mg at bedtime by mouth.     Multiple Vitamin (MULTIVITAMIN WITH  MINERALS) TABS tablet Take 1 tablet by mouth daily. Unknown strengfth     nateglinide (STARLIX) 120 MG tablet Take 120 mg by mouth daily.      omeprazole (PRILOSEC) 40 MG capsule Take 40 mg by mouth 2 (two) times daily.     OXYGEN Inhale 4 L/min into the lungs as directed. 24 hours a day     spironolactone (ALDACTONE) 25 MG tablet Take 12.5 mg by mouth daily.     STIOLTO RESPIMAT 2.5-2.5 MCG/ACT AERS INHALE 2 PUFFS BY MOUTH INTO THE LUNGS DAILY 1 each 5   Treprostinil (TYVASO) 0.6 MG/ML SOLN Inhale 18 mcg into the lungs in the morning, at noon, in the evening, and at bedtime.     XARELTO 15 MG TABS tablet TAKE 1 TABLET BY MOUTH EVERY  DAY 90 tablet 3   ALPRAZolam (XANAX) 0.5 MG tablet Take 1 tablet (0.5 mg total) by mouth at bedtime as needed for sleep. 30 tablet 0   No facility-administered medications prior to visit.   Allergies  Allergen Reactions   Allopurinol Nausea And Vomiting   Aspirin     Causes jitters    Objective:   Today's Vitals   03/18/21 1132  BP: 118/84  Pulse: 63  Temp: (!) 96.3 F (35.7 C)  TempSrc: Temporal  SpO2: 94%  Weight: 150 lb 9.6 oz (68.3 kg)  Height: _0  (1.549 m)   Body mass index is 28.46 kg/m.   General: Well developed, well nourished. No acute distress. Psych: Alert and oriented. Normal mood and affect.  Health Maintenance Due  Topic Date Due   FOOT EXAM  Never done   URINE MICROALBUMIN  Never done   Zoster Vaccines- Shingrix (1 of 2) Never done   DEXA SCAN  Never done   PNA vac Low Risk Adult (2 of 2 - PCV13) 06/20/2016   COVID-19 Vaccine (4 - Booster for Pfizer series) 09/03/2020   OPHTHALMOLOGY EXAM  11/12/2020   INFLUENZA VACCINE  02/14/2021   HEMOGLOBIN A1C  02/24/2021     Assessment & Plan:   1. Essential hypertension Blood pressure is at goal currently. We will monitor this.  2. Pulmonary hypertension ([place]) Ms. Killilea  will continue to follow with cardiology and pulmonology.  3. Insomnia I will plan to continue her  on lorazepam qhs.  - ALPRAZolam (XANAX) 0.5 MG tablet; Take 1 tablet (0.5 mg total) by mouth at bedtime as needed for sleep.  Dispense: 30 tablet; Refill: 0  4. Controlled type 2 diabetes mellitus with stage 2 chronic kidney disease, without long-term current use of insulin ([place]) Due for lab follow-up for her diabetes. I have asked that Ms. Ligas  be scheduled for a Transfer of Care appointment, as this was never done to allow me to assume her overall care.  - Microalbumin / creatinine urine ratio; Future - Basic metabolic panel; Future - Hemoglobin A1c; Future - Urinalysis, Routine w reflex microscopic; Future  5. Hypothyroidism, unspecified type Due to reassess TSH to determine adequacy of thyroid replacement.  - TSH; Future  6. Hyperlipidemia, unspecified hyperlipidemia type Due for lipids to assess adequacy of atorvastatin.  - Lipid panel; Future  Haydee Salter, MD

## 2021-03-19 ENCOUNTER — Other Ambulatory Visit: Payer: Self-pay | Admitting: Family Medicine

## 2021-03-22 ENCOUNTER — Ambulatory Visit: Payer: Medicare PPO | Admitting: Physical Therapy

## 2021-03-25 ENCOUNTER — Other Ambulatory Visit: Payer: Medicare PPO

## 2021-03-28 ENCOUNTER — Other Ambulatory Visit: Payer: Medicare PPO

## 2021-03-29 ENCOUNTER — Telehealth (HOSPITAL_COMMUNITY): Payer: Self-pay | Admitting: *Deleted

## 2021-03-29 ENCOUNTER — Ambulatory Visit: Payer: Medicare PPO | Admitting: Physical Therapy

## 2021-03-29 NOTE — Telephone Encounter (Signed)
Pt called c/o bad headache and stomach pains since increasing Tyvaso to the "7th dose".  Pt wants to know if she needs to decrease or stop medication.   Routed to [place] for advice

## 2021-03-31 ENCOUNTER — Other Ambulatory Visit: Payer: Medicare PPO

## 2021-03-31 NOTE — Telephone Encounter (Signed)
Pt returned call she is aware and agreeable with plan.

## 2021-03-31 NOTE — Telephone Encounter (Signed)
Left vm for pt to return my call.  

## 2021-04-01 ENCOUNTER — Encounter: Payer: Self-pay | Admitting: Family Medicine

## 2021-04-01 ENCOUNTER — Other Ambulatory Visit (INDEPENDENT_AMBULATORY_CARE_PROVIDER_SITE_OTHER): Payer: Medicare PPO

## 2021-04-01 ENCOUNTER — Other Ambulatory Visit: Payer: Self-pay

## 2021-04-01 DIAGNOSIS — E785 Hyperlipidemia, unspecified: Secondary | ICD-10-CM

## 2021-04-01 DIAGNOSIS — E1122 Type 2 diabetes mellitus with diabetic chronic kidney disease: Secondary | ICD-10-CM

## 2021-04-01 DIAGNOSIS — N182 Chronic kidney disease, stage 2 (mild): Secondary | ICD-10-CM | POA: Diagnosis not present

## 2021-04-01 DIAGNOSIS — N1832 Chronic kidney disease, stage 3b: Secondary | ICD-10-CM | POA: Insufficient documentation

## 2021-04-01 DIAGNOSIS — E039 Hypothyroidism, unspecified: Secondary | ICD-10-CM

## 2021-04-01 LAB — URINALYSIS, ROUTINE W REFLEX MICROSCOPIC
Nitrite: NEGATIVE
Specific Gravity, Urine: 1.025 (ref 1.000–1.030)
Total Protein, Urine: 30 — AB
Urine Glucose: NEGATIVE
Urobilinogen, UA: 1 (ref 0.0–1.0)
pH: 6 (ref 5.0–8.0)

## 2021-04-01 LAB — MICROALBUMIN / CREATININE URINE RATIO
Creatinine,U: 179.7 mg/dL
Microalb Creat Ratio: 18.6 mg/g (ref 0.0–30.0)
Microalb, Ur: 33.4 mg/dL — ABNORMAL HIGH (ref 0.0–1.9)

## 2021-04-01 LAB — TSH: TSH: 0.53 u[IU]/mL (ref 0.35–5.50)

## 2021-04-01 LAB — LIPID PANEL
Cholesterol: 97 mg/dL (ref 0–200)
HDL: 44.5 mg/dL (ref >39.00)
LDL Cholesterol: 35 mg/dL (ref 0–99)
NonHDL: 52.11
Total CHOL/HDL Ratio: 2
Triglycerides: 85 mg/dL (ref 0.0–149.0)
VLDL: 17 mg/dL (ref 0.0–40.0)

## 2021-04-01 LAB — BASIC METABOLIC PANEL
BUN: 29 mg/dL — ABNORMAL HIGH (ref 6–23)
CO2: 29 mEq/L (ref 19–32)
Calcium: 9.6 mg/dL (ref 8.4–10.5)
Chloride: 104 mEq/L (ref 96–112)
Creatinine, Ser: 1.35 mg/dL — ABNORMAL HIGH (ref 0.40–1.20)
GFR: 37.23 mL/min — ABNORMAL LOW (ref >60.00)
Glucose, Bld: 88 mg/dL (ref 70–99)
Potassium: 5.3 mEq/L — ABNORMAL HIGH (ref 3.5–5.1)
Sodium: 142 mEq/L (ref 135–145)

## 2021-04-01 LAB — HEMOGLOBIN A1C: Hgb A1c MFr Bld: 6.7 % — ABNORMAL HIGH (ref 4.6–6.5)

## 2021-04-01 NOTE — Progress Notes (Signed)
Per the orders of Dr. Gena Fray pt is here for labs, tolerated draw well. Pt was able to provide adequate amount of urine for sample requested.

## 2021-04-04 ENCOUNTER — Encounter: Payer: Self-pay | Admitting: Family Medicine

## 2021-04-04 DIAGNOSIS — R8271 Bacteriuria: Secondary | ICD-10-CM

## 2021-04-05 ENCOUNTER — Other Ambulatory Visit: Payer: Self-pay

## 2021-04-05 ENCOUNTER — Encounter: Payer: Self-pay | Admitting: Physical Therapy

## 2021-04-05 ENCOUNTER — Ambulatory Visit: Payer: Medicare PPO | Attending: Family Medicine | Admitting: Physical Therapy

## 2021-04-05 DIAGNOSIS — M62830 Muscle spasm of back: Secondary | ICD-10-CM | POA: Diagnosis present

## 2021-04-05 DIAGNOSIS — R2689 Other abnormalities of gait and mobility: Secondary | ICD-10-CM

## 2021-04-05 DIAGNOSIS — M545 Low back pain, unspecified: Secondary | ICD-10-CM

## 2021-04-05 DIAGNOSIS — M6281 Muscle weakness (generalized): Secondary | ICD-10-CM | POA: Diagnosis not present

## 2021-04-05 DIAGNOSIS — R5381 Other malaise: Secondary | ICD-10-CM

## 2021-04-05 DIAGNOSIS — R262 Difficulty in walking, not elsewhere classified: Secondary | ICD-10-CM | POA: Diagnosis present

## 2021-04-05 DIAGNOSIS — Z9181 History of falling: Secondary | ICD-10-CM

## 2021-04-05 NOTE — Therapy (Signed)
[place]. [place], [name], [address] Phone: [phone]   Fax:  [phone]  Physical Therapy Treatment  Patient Details  Name: Amanda Short MRN: [id number] Date of Birth: 05/11/1941 No data recorded  Encounter Date: 04/05/2021   PT End of Session - 04/05/21 1610     Visit Number 10    PT Start Time 1528    PT Stop Time 3154    PT Time Calculation (min) 42 min    Activity Tolerance Patient limited by fatigue    Behavior During Therapy [facility] for tasks assessed/performed             Past Medical History:  Diagnosis Date   Abdominal pain    Abnormal CT of the abdomen    Acute blood loss anemia    Acute cystitis with hematuria 12/19/2019   Acute encephalopathy 06/06/2017   Acute pulmonary embolism ([place]) 04/01/2013   Acute respiratory failure with hypoxia ([place])    Anxiety state    Arthritis    "back, hands" (07/22/2015)   Aspiration pneumonia ([place])    Benign essential HTN    Cancer of left breast ([place]) 2006   S/P lumpectomy   Cellulitis of foot, right 07/21/2015   Cellulitis of right foot 07/21/2015   Central retinal vein occlusion of right eye 04/20/2016   Chronic diastolic CHF (congestive heart failure) ([place]) 07/10/2017   Chronic respiratory failure with hypoxia ([place])    Colitis 00/86/7619   Complication of anesthesia    "brief breathing problem at surgery center in ~ 2005 when I had gallbladder OR"   Concussion 03/2013   due to fall   Depression    Diabetes ([place]) 04/01/2013   Diabetes mellitus type 2 in nonobese ([place])    Diabetes mellitus type 2, controlled ([place]) 07/21/2015   Diabetic foot infection ([place]) 07/21/2015   Diverticulitis of colon    Diverticulitis of large intestine without perforation or abscess without bleeding    Diverticulosis    DVT (deep venous thrombosis) ([place]) 03/2013   LLE   Encephalopathy    Encounter for attention to tracheostomy [facility])    Encounter for central line placement     Encounter for orogastric (OG) tube placement    Essential hypertension 04/01/2013   Essential hypertension, benign 04/01/2013   GERD (gastroesophageal reflux disease)    History of acute respiratory failure    History of breast cancer 2006   S/P lumpectomy   History of pulmonary embolism    HX: breast cancer 04/01/2013   Hyperlipidemia    Hypertension    Hypocalcemia 05/27/2017   Hypokalemia 07/10/2017   Hypomagnesemia 07/11/2017   Hypothyroidism    Hypoxia 03/23/2020   Nausea vomiting and diarrhea 07/10/2017   Non-intractable cyclical vomiting with nausea    Nuclear sclerotic cataract of both eyes 04/20/2016   OSA (obstructive sleep apnea) 07/10/2017   OSA on CPAP 04/01/2013   OSA treated with BiPAP    Physical debility 06/23/2017   Prolonged QT interval 05/27/2017   Pulmonary embolism ([place]) 03/2013   Pulmonary HTN ([place]) 07/05/2017   Pulmonary hypertension ([place]) 07/05/2017   Pulmonary nodule 07/26/2020   Seizure ([place]) 07/10/2017   Seizure disorder ([place]) 07/05/2017   Seizures ([place])    Sepsis ([place]) 07/10/2017   Sleep apnea    Status epilepticus ([place])    Status post trachelectomy 06/23/2017   Tachypnea    Thyroid disease    Hypothyroid   Tracheostomy, acute management ([place])  Type II diabetes mellitus ([place])     Past Surgical History:  Procedure Laterality Date   ABDOMINAL HYSTERECTOMY  1970s   BREAST BIOPSY Left 2006   BREAST LUMPECTOMY Left 2006   BUNIONECTOMY WITH HAMMERTOE RECONSTRUCTION Left    CARDIAC CATHETERIZATION  06/2020   ESOPHAGOGASTRODUODENOSCOPY N/A 05/29/2017   Procedure: ESOPHAGOGASTRODUODENOSCOPY (EGD);  Surgeon: Irene Shipper, MD;  Location: [facility];  Service: Endoscopy;  Laterality: N/A;   JOINT REPLACEMENT     LAPAROSCOPIC CHOLECYSTECTOMY  ~ 2005   Pulmonary function test  2-3 weeks ago   RIGHT/LEFT HEART CATH AND CORONARY ANGIOGRAPHY N/A 05/24/2020   Procedure: RIGHT/LEFT HEART CATH AND CORONARY ANGIOGRAPHY;  Surgeon: Jolaine Artist, MD;   Location: [facility];  Service: Cardiovascular;  Laterality: N/A;   TOTAL HIP ARTHROPLASTY Left 2010   TOTAL KNEE ARTHROPLASTY Bilateral 2005-2006    There were no vitals filed for this visit.   Subjective Assessment - 04/05/21 1532     Subjective Patient has not been in to PT in about 4 weeks, she reports that she is still struggling with medications, side effects, breathing and not overall feeling well.  She reports that she really has not felt well and has not been out of the house much.  She does report a fall about 3 weeks ago, she reports that she was bending down to get something and the knee gave out.    Currently in Pain? Yes    Pain Score 8     Pain Location Back    Pain Orientation Lower    Pain Descriptors / Indicators Sore;Aching    Aggravating Factors  walking makes it worse pain an 8-9/10 some pain in to the ribs    Pain Relieving Factors nothing really helps                [facility] PT Assessment - 04/05/21 0001       Timed Up and Go Test   Normal TUG (seconds) 17                           [facility] Adult PT Treatment/Exercise - 04/05/21 0001       Ambulation/Gait   Gait Comments HHA 171fet x 2 wtih rests due to fatigue and some baclk pain      Lumbar Exercises: Seated   Long Arc Quad on Chair Both;2 sets;10 reps    LAQ on Chair Weights (lbs) 2    Other Seated Lumbar Exercises marching 2# 2x10 each, 3# bicep curls    Other Seated Lumbar Exercises red tband HS curl                     PT Education - 04/05/21 1610     Education Details Went over with her and her husband about them coming in and doing the two or three machines on their own    Person(s) Educated Patient;Spouse    Methods Explanation;Demonstration    Comprehension Verbalized understanding              PT Short Term Goals - 12/04/19 1627       PT SHORT TERM GOAL #1   Title independent with intial HEP    Status Achieved               PT  Long Term Goals - 04/05/21 1655       PT LONG TERM GOAL #1   Title  increase overall hip LE strength for functional gait and safety hips and ankles to 4+/5    Status Not Met      PT LONG TERM GOAL #2   Title decrease TUG time to less than 14 seconds for functional gait and safety    Status Partially Met      PT LONG TERM GOAL #3   Title increase BERG to 50/56 for safety and independence    Status Partially Met      PT LONG TERM GOAL #4   Title decrease LBP 50% with activities    Status Partially Met      PT LONG TERM GOAL #5   Title increase lumbar ROM 25%    Status Achieved                   Plan - 04/05/21 1611     Clinical Impression Statement Patient continued to struggle with feeling good enough to come to PT due to medication and medical complications.  She has reduced the medicaiton that was causing side effects but still having issues.  She has not been to PT in the past month  I spoke with her and her husband about trying to do some exercises in our gym on their own, they were agreeable.  I think this will allow her to be out with some medical supervision and do some exercises but allow her to do it when she feels good.  She has had a fall in the past month and is having back pain.  Her O2 saturation remained b/n 88-90%.  Her Tug time did improve slightly.  100' of ambulation with a single HHA is about her max now due to LBP and fatigue.    PT Next Visit Plan Will D/C as we have not had consistent PT and minimal gains, I do worry about her regressing with her medical complications and troubles, we did go over the gym activities that they could do here to help maintain    Consulted and Agree with Plan of Care Patient;Family member/caregiver    Family Member Consulted husband Sid             Patient will benefit from skilled therapeutic intervention in order to improve the following deficits and impairments:  Abnormal gait, Difficulty walking, Increased muscle  spasms, Decreased activity tolerance, Pain, Decreased balance, Impaired flexibility, Improper body mechanics, Postural dysfunction, Decreased strength, Decreased mobility  Visit Diagnosis: Muscle weakness (generalized)  Difficulty in walking, not elsewhere classified  Physical debility  Acute bilateral low back pain without sciatica  Risk for falls  Muscle spasm of back  Other abnormalities of gait and mobility     Problem List Patient Active Problem List   Diagnosis Date Noted   Chronic kidney disease, stage 3b ([place]) 04/01/2021   Insomnia 03/18/2021   Atypical chest pain 11/30/2020   History of pulmonary embolism    Encounter for orogastric (OG) tube placement    Abdominal pain    COPD (chronic obstructive pulmonary disease) ([place]) 07/26/2020   Pulmonary nodule 07/26/2020   Abnormal CT of the abdomen    Hyperlipidemia    GERD (gastroesophageal reflux disease)    Diverticulosis    Depression    Complication of anesthesia    Arthritis    Acute cystitis with hematuria 12/19/2019   Hypomagnesemia 07/11/2017   Hypokalemia 07/10/2017   Chronic diastolic CHF (congestive heart failure) ([place]) 07/10/2017   Seizure disorder ([place]) 07/05/2017   Pulmonary hypertension ([place]) 07/05/2017  Anxiety state    Physical debility 06/23/2017   Status post trachelectomy 06/23/2017   History of acute respiratory failure    Status epilepticus (HCC)    Acute blood loss anemia    Chronic respiratory failure with hypoxia (HCC)    Acute encephalopathy 06/06/2017   Diverticulitis of large intestine without perforation or abscess without bleeding    Non-intractable cyclical vomiting with nausea    Hypocalcemia 05/27/2017   Prolonged QT interval 05/27/2017   Colitis 05/27/2017   Central retinal vein occlusion of right eye 04/20/2016   Nuclear sclerotic cataract of both eyes 04/20/2016   Diabetic foot infection ([place]) 07/21/2015   Diabetes mellitus type 2, controlled ([place]) 07/21/2015   DVT  (deep venous thrombosis) ([place]) 04/01/2013   Essential hypertension 04/01/2013   Hypothyroidism 04/01/2013   HX: breast cancer 04/01/2013   Pulmonary embolism ([place]) 03/2013   History of breast cancer 2006   Cancer of left breast [facility]) 2006    [name] Boast, PT 04/05/2021, 4:56 PM  [place]. [place], [name], [address] Phone: [phone]   Fax:  [phone]  Name: Amanda Short MRN: [id number] Date of Birth: [birth year]

## 2021-04-05 NOTE — Telephone Encounter (Signed)
Patient reports having urinary symptoms.  Please review my chart message and advise.   Thanks.  Dm/cma

## 2021-04-06 MED ORDER — SULFAMETHOXAZOLE-TRIMETHOPRIM 800-160 MG PO TABS: 1.0000 | ORAL_TABLET | Freq: Two times a day (BID) | ORAL | 0 refills | Status: DC

## 2021-04-06 NOTE — Addendum Note (Signed)
Addended by: Haydee Salter on: 04/06/2021 06:17 PM   Modules accepted: Orders

## 2021-04-07 NOTE — Telephone Encounter (Signed)
Unable to leave VM. Will try later. Dm/cma

## 2021-04-08 NOTE — Telephone Encounter (Signed)
Patient notified VIA phone that RX was sent to the pharmacy.  Dm/cma

## 2021-04-18 ENCOUNTER — Other Ambulatory Visit: Payer: Self-pay | Admitting: Family Medicine

## 2021-04-18 ENCOUNTER — Ambulatory Visit: Payer: Medicare PPO | Admitting: Cardiology

## 2021-04-18 DIAGNOSIS — F5101 Primary insomnia: Secondary | ICD-10-CM

## 2021-04-18 DIAGNOSIS — N182 Chronic kidney disease, stage 2 (mild): Secondary | ICD-10-CM

## 2021-04-18 DIAGNOSIS — E1122 Type 2 diabetes mellitus with diabetic chronic kidney disease: Secondary | ICD-10-CM

## 2021-04-18 MED ORDER — NATEGLINIDE 120 MG PO TABS: 120.0000 mg | ORAL_TABLET | Freq: Every day | ORAL | 3 refills | Status: DC

## 2021-04-18 NOTE — Telephone Encounter (Signed)
Refill request for:  Alprazolam 0.5 mg  LR 03/18/21, #30, 0 rf  Diltazem 30 mg LR  HX provider  Uloric 40 mg LR  hx provider LOV 03/18/21 FOV 06/17/21   Please review and advise  Thanks. Dm/cma

## 2021-05-16 ENCOUNTER — Other Ambulatory Visit: Payer: Self-pay | Admitting: Family Medicine

## 2021-05-16 DIAGNOSIS — F5101 Primary insomnia: Secondary | ICD-10-CM

## 2021-05-23 ENCOUNTER — Other Ambulatory Visit: Payer: Self-pay

## 2021-05-23 ENCOUNTER — Encounter: Payer: Self-pay | Admitting: Cardiology

## 2021-05-23 ENCOUNTER — Ambulatory Visit (INDEPENDENT_AMBULATORY_CARE_PROVIDER_SITE_OTHER): Payer: Medicare PPO | Admitting: Cardiology

## 2021-05-23 VITALS — BP 128/72 | HR 70 | Ht 61.0 in | Wt 147.0 lb

## 2021-05-23 DIAGNOSIS — J9611 Chronic respiratory failure with hypoxia: Secondary | ICD-10-CM

## 2021-05-23 DIAGNOSIS — I272 Pulmonary hypertension, unspecified: Secondary | ICD-10-CM | POA: Diagnosis not present

## 2021-05-23 DIAGNOSIS — J449 Chronic obstructive pulmonary disease, unspecified: Secondary | ICD-10-CM

## 2021-05-23 NOTE — Patient Instructions (Signed)
Medication Instructions:  Your physician recommends that you continue on your current medications as directed. Please refer to the Current Medication list given to you today.  *If you need a refill on your cardiac medications before your next appointment, please call your pharmacy*   Lab Work: None If you have labs (blood work) drawn today and your tests are completely normal, you will receive your results only by: [place] (if you have MyChart) OR A paper copy in the mail If you have any lab test that is abnormal or we need to change your treatment, we will call you to review the results.   Testing/Procedures: None   Follow-Up: At [facility], you and your health needs are our priority.  As part of our continuing mission to provide you with exceptional heart care, we have created designated Provider Care Teams.  These Care Teams include your primary Cardiologist (physician) and Advanced Practice Providers (APPs -  Physician Assistants and Nurse Practitioners) who all work together to provide you with the care you need, when you need it.  We recommend signing up for the patient portal called "MyChart".  Sign up information is provided on this After Visit Summary.  MyChart is used to connect with patients for Virtual Visits (Telemedicine).  Patients are able to view lab/test results, encounter notes, upcoming appointments, etc.  Non-urgent messages can be sent to your provider as well.   To learn more about what you can do with MyChart, go to [email or web address].    Your next appointment:   5 month(s)  The format for your next appointment:   In Person  Provider:   Jenne Campus, MD{    Other Instructions

## 2021-05-23 NOTE — Progress Notes (Signed)
Cardiology Office Note:    Date:  05/23/2021   ID:  Amanda Short, DOB [birth year], MRN [id number]  PCP:  Amanda Salter, MD  Cardiologist:  Amanda Campus, MD    Referring MD: Amanda Salter, MD   Chief Complaint  Patient presents with   Extremity Weakness    History of Present Illness:    Amanda Short is a 80 y.o. female   with past medical history significant for type 2 diabetes, essential hypertension, obstructive sleep apnea, ex smoker, history of DVT PE in 2014, history of chronic respiratory failure, severe pulmonary hypertension which is probably Group 1 with group 3 pulmonary arterial hypertension. She comes today to my office for follow-up overall she said respiratory wise she is doing well actually she tells me she is doing better.  She will not have to live with her condition and coping with distortion quite well.  Biggest complaint that she has right now is weakness of her lower extremities.  She is spending majority of time sitting in the chair and she tried to walk somewhat but this difficulty now she is getting up 3 to walk.  She participated in respiratory rehab I asked her to talk to her about potentially doing some exercises for her legs.  And I showed her also some exercises that need to be done for that.  She denies have any chest pain, she does get headaches sometimes but that is related to medication she takes for pulmonary hypertension.  Past Medical History:  Diagnosis Date   Abdominal pain    Abnormal CT of the abdomen    Acute blood loss anemia    Acute cystitis with hematuria 12/19/2019   Acute encephalopathy 06/06/2017   Acute pulmonary embolism ([place]) 04/01/2013   Acute respiratory failure with hypoxia (HCC)    Anxiety state    Arthritis    "back, hands" (07/22/2015)   Aspiration pneumonia (HCC)    Benign essential HTN    Cancer of left breast ([place]) 2006   S/P lumpectomy   Cellulitis of foot, right 07/21/2015   Cellulitis of right foot 07/21/2015    Central retinal vein occlusion of right eye 04/20/2016   Chronic diastolic CHF (congestive heart failure) ([place]) 07/10/2017   Chronic respiratory failure with hypoxia ([place])    Colitis 82/95/6213   Complication of anesthesia    "brief breathing problem at surgery center in ~ 2005 when I had gallbladder OR"   Concussion 03/2013   due to fall   Depression    Diabetes ([place]) 04/01/2013   Diabetes mellitus type 2 in nonobese ([place])    Diabetes mellitus type 2, controlled ([place]) 07/21/2015   Diabetic foot infection ([place]) 07/21/2015   Diverticulitis of colon    Diverticulitis of large intestine without perforation or abscess without bleeding    Diverticulosis    DVT (deep venous thrombosis) ([place]) 03/2013   LLE   Encephalopathy    Encounter for attention to tracheostomy [facility])    Encounter for central line placement    Encounter for orogastric (OG) tube placement    Essential hypertension 04/01/2013   Essential hypertension, benign 04/01/2013   GERD (gastroesophageal reflux disease)    History of acute respiratory failure    History of breast cancer 2006   S/P lumpectomy   History of pulmonary embolism    HX: breast cancer 04/01/2013   Hyperlipidemia    Hypertension    Hypocalcemia 05/27/2017   Hypokalemia 07/10/2017   Hypomagnesemia 07/11/2017   Hypothyroidism  Hypoxia 03/23/2020   Nausea vomiting and diarrhea 07/10/2017   Non-intractable cyclical vomiting with nausea    Nuclear sclerotic cataract of both eyes 04/20/2016   OSA (obstructive sleep apnea) 07/10/2017   OSA on CPAP 04/01/2013   OSA treated with BiPAP    Physical debility 06/23/2017   Prolonged QT interval 05/27/2017   Pulmonary embolism ([place]) 03/2013   Pulmonary HTN ([place]) 07/05/2017   Pulmonary hypertension ([place]) 07/05/2017   Pulmonary nodule 07/26/2020   Seizure ([place]) 07/10/2017   Seizure disorder ([place]) 07/05/2017   Seizures ([place])    Sepsis ([place]) 07/10/2017   Sleep apnea    Status epilepticus ([place])    Status post  trachelectomy 06/23/2017   Tachypnea    Thyroid disease    Hypothyroid   Tracheostomy, acute management ([place])    Type II diabetes mellitus ([place])     Past Surgical History:  Procedure Laterality Date   ABDOMINAL HYSTERECTOMY  1970s   BREAST BIOPSY Left 2006   BREAST LUMPECTOMY Left 2006   BUNIONECTOMY WITH HAMMERTOE RECONSTRUCTION Left    CARDIAC CATHETERIZATION  06/2020   ESOPHAGOGASTRODUODENOSCOPY N/A 05/29/2017   Procedure: ESOPHAGOGASTRODUODENOSCOPY (EGD);  Surgeon: Irene Shipper, MD;  Location: [facility];  Service: Endoscopy;  Laterality: N/A;   JOINT REPLACEMENT     LAPAROSCOPIC CHOLECYSTECTOMY  ~ 2005   Pulmonary function test  2-3 weeks ago   RIGHT/LEFT HEART CATH AND CORONARY ANGIOGRAPHY N/A 05/24/2020   Procedure: RIGHT/LEFT HEART CATH AND CORONARY ANGIOGRAPHY;  Surgeon: Jolaine Artist, MD;  Location: [facility];  Service: Cardiovascular;  Laterality: N/A;   TOTAL HIP ARTHROPLASTY Left 2010   TOTAL KNEE ARTHROPLASTY Bilateral 2005-2006    Current Medications: Current Meds  Medication Sig   acetaminophen (TYLENOL) 500 MG tablet Take 500-1,000 mg by mouth every 6 (six) hours as needed for mild pain.    ALPRAZolam (XANAX) 0.5 MG tablet TAKE 1 TABLET (0.5 MG TOTAL) BY MOUTH AT BEDTIME AS NEEDED FOR SLEEP.   atorvastatin (LIPITOR) 20 MG tablet Take 20 mg by mouth daily.   benzonatate (TESSALON PERLES) 100 MG capsule Take 1 capsule (100 mg total) by mouth 3 (three) times daily as needed. (Patient taking differently: Take 100 mg by mouth 3 (three) times daily as needed for cough.)   calcium-vitamin D (OSCAL WITH D) 500-200 MG-UNIT tablet Take 1 tablet by mouth 2 (two) times daily.   diltiazem (CARDIZEM) 30 MG tablet TAKE 1 TABLET BY MOUTH TWICE A DAY (Patient taking differently: Take 30 mg by mouth 2 (two) times daily.)   DULoxetine (CYMBALTA) 60 MG capsule Take 1 capsule (60 mg total) by mouth daily.   febuxostat (ULORIC) 40 MG tablet TAKE 1 TABLET BY MOUTH EVERY  DAY (Patient taking differently: Take 40 mg by mouth daily.)   furosemide (LASIX) 40 MG tablet Take 40 mg by mouth daily.   insulin degludec (TRESIBA FLEXTOUCH) 100 UNIT/ML SOPN FlexTouch Pen Inject 24 Units into the skin daily.   levETIRAcetam (KEPPRA) 500 MG tablet Take 500 mg by mouth 2 (two) times daily.   levothyroxine (SYNTHROID) 100 MCG tablet Take 1 tablet (100 mcg total) by mouth daily before breakfast.   loratadine (CLARITIN) 10 MG tablet Take 10 mg by mouth daily as needed for allergies.   metoprolol succinate (TOPROL XL) 50 MG 24 hr tablet Take 1 tablet (50 mg total) by mouth daily. Take with or immediately following a meal.   montelukast (SINGULAIR) 10 MG tablet Take 10 mg at bedtime by mouth.  Multiple Vitamin (MULTIVITAMIN WITH MINERALS) TABS tablet Take 1 tablet by mouth daily. Unknown strengfth   nateglinide (STARLIX) 120 MG tablet Take 1 tablet (120 mg total) by mouth daily.   omeprazole (PRILOSEC) 40 MG capsule Take 40 mg by mouth 2 (two) times daily.   OXYGEN Inhale 4 L/min into the lungs as directed. 24 hours a day   spironolactone (ALDACTONE) 25 MG tablet Take 12.5 mg by mouth daily.   STIOLTO RESPIMAT 2.5-2.5 MCG/ACT AERS INHALE 2 PUFFS BY MOUTH INTO THE LUNGS DAILY (Patient taking differently: Inhale 2 puffs into the lungs daily.)   sulfamethoxazole-trimethoprim (BACTRIM DS) 800-160 MG tablet Take 1 tablet by mouth 2 (two) times daily.   Treprostinil (TYVASO) 0.6 MG/ML SOLN Inhale 18 mcg into the lungs in the morning, at noon, in the evening, and at bedtime.   XARELTO 15 MG TABS tablet TAKE 1 TABLET BY MOUTH EVERY DAY (Patient taking differently: Take 15 mg by mouth daily with supper.)     Allergies:   Allopurinol and Aspirin   Social History   Socioeconomic History   Marital status: Married    Spouse name: Not on file   Number of children: 1   Years of education: Not on file   Highest education level: Not on file  Occupational History   Occupation: retired   Tobacco Use   Smoking status: Former    Packs/day: 1.00    Years: 30.00    Pack years: 30.00    Types: Cigarettes    Quit date: 1995    Years since quitting: 27.8   Smokeless tobacco: Never   Tobacco comments:    "quit smoking cigarettes in the 1990s"  Vaping Use   Vaping Use: Never used  Substance and Sexual Activity   Alcohol use: No   Drug use: No   Sexual activity: Not Currently  Other Topics Concern   Not on file  Social History Narrative   Not on file   Social Determinants of Health   Financial Resource Strain: Not on file  Food Insecurity: Not on file  Transportation Needs: Not on file  Physical Activity: Not on file  Stress: Not on file  Social Connections: Not on file     Family History: The patient's family history includes Breast cancer in her cousin and sister; Colon cancer in her maternal grandmother; Diabetes in her sister and another family member; Hypertension in her mother; Kidney disease in her sister; Liver cancer in her mother; Uterine cancer in her mother. ROS:   Please see the history of present illness.    All 14 point review of systems negative except as described per history of present illness  EKGs/Labs/Other Studies Reviewed:      Recent Labs: 05/24/2020: Hemoglobin 13.9 04/01/2021: BUN 29; Creatinine, Ser 1.35; Potassium 5.3 No hemolysis seen; Sodium 142; TSH 0.53  Recent Lipid Panel    Component Value Date/Time   CHOL 97 04/01/2021 0900   CHOL 131 07/13/2020 1556   TRIG 85.0 04/01/2021 0900   HDL 44.50 04/01/2021 0900   HDL 49 07/13/2020 1556   CHOLHDL 2 04/01/2021 0900   VLDL 17.0 04/01/2021 0900   LDLCALC 35 04/01/2021 0900   LDLCALC 35 08/27/2020 1421    Physical Exam:    VS:  BP 128/72 (BP Location: Left Arm, Patient Position: Sitting)   Pulse 70   Ht 5' 1" (1.549 m)   Wt 147 lb (66.7 kg)   SpO2 96%   BMI 27.78 kg/m  Wt Readings from Last 3 Encounters:  05/23/21 147 lb (66.7 kg)  03/18/21 150 lb 9.6 oz (68.3  kg)  11/30/20 147 lb (66.7 kg)     GEN:  Well nourished, well developed in no acute distress HEENT: Normal NECK: No JVD; No carotid bruits LYMPHATICS: No lymphadenopathy CARDIAC: RRR, no murmurs, no rubs, no gallops RESPIRATORY:  Clear to auscultation without rales, wheezing or rhonchi  ABDOMEN: Soft, non-tender, non-distended MUSCULOSKELETAL:  No edema; No deformity  SKIN: Warm and dry LOWER EXTREMITIES: no swelling NEUROLOGIC:  Alert and oriented x 3 PSYCHIATRIC:  Normal affect   ASSESSMENT:    1. Pulmonary hypertension (HCC)   2. Chronic respiratory failure with hypoxia (HCC)   3. Chronic obstructive pulmonary disease, unspecified COPD type ([place])    PLAN:    In order of problems listed above:  Pulmonary hypertension.  Echocardiogram will be done 2 days anxiously waiting to see effect of current treatment clinically she seems to be doing better Chronic respiratory failure with hypoxemia.  She is on oxygen which we will continue. Chest pain that she complained about last time but does not have anymore right now. Essential hypertension blood pressure well controlled continue present management. Dyslipidemia I did review K PN which show LDL 35 HDL 44.  Continue present management.   Medication Adjustments/Labs and Tests Ordered: Current medicines are reviewed at length with the patient today.  Concerns regarding medicines are outlined above.  No orders of the defined types were placed in this encounter.  Medication changes: No orders of the defined types were placed in this encounter.   Signed, Park Liter, MD, [facility] 05/23/2021 4:49 PM    [place]

## 2021-05-25 ENCOUNTER — Encounter (HOSPITAL_COMMUNITY): Payer: Self-pay | Admitting: Internal Medicine

## 2021-05-25 ENCOUNTER — Ambulatory Visit (HOSPITAL_COMMUNITY)
Admission: RE | Admit: 2021-05-25 | Discharge: 2021-05-25 | Disposition: A | Discharge status: Home / Self Care | Payer: Medicare PPO | Source: Ambulatory Visit | Attending: Internal Medicine | Admitting: Internal Medicine

## 2021-05-25 ENCOUNTER — Ambulatory Visit (HOSPITAL_BASED_OUTPATIENT_CLINIC_OR_DEPARTMENT_OTHER)
Admission: RE | Admit: 2021-05-25 | Discharge: 2021-05-25 | Disposition: A | Discharge status: Home / Self Care | Payer: Medicare PPO | Source: Ambulatory Visit | Attending: Internal Medicine | Admitting: Internal Medicine

## 2021-05-25 VITALS — BP 138/78 | HR 74 | Wt 147.0 lb

## 2021-05-25 DIAGNOSIS — J9611 Chronic respiratory failure with hypoxia: Secondary | ICD-10-CM | POA: Diagnosis not present

## 2021-05-25 DIAGNOSIS — I3139 Other pericardial effusion (noninflammatory): Secondary | ICD-10-CM | POA: Diagnosis not present

## 2021-05-25 DIAGNOSIS — Z9981 Dependence on supplemental oxygen: Secondary | ICD-10-CM | POA: Insufficient documentation

## 2021-05-25 DIAGNOSIS — I2510 Atherosclerotic heart disease of native coronary artery without angina pectoris: Secondary | ICD-10-CM | POA: Insufficient documentation

## 2021-05-25 DIAGNOSIS — I2729 Other secondary pulmonary hypertension: Secondary | ICD-10-CM | POA: Insufficient documentation

## 2021-05-25 DIAGNOSIS — Z87891 Personal history of nicotine dependence: Secondary | ICD-10-CM | POA: Diagnosis not present

## 2021-05-25 DIAGNOSIS — G4733 Obstructive sleep apnea (adult) (pediatric): Secondary | ICD-10-CM | POA: Insufficient documentation

## 2021-05-25 DIAGNOSIS — Z86718 Personal history of other venous thrombosis and embolism: Secondary | ICD-10-CM | POA: Insufficient documentation

## 2021-05-25 DIAGNOSIS — E875 Hyperkalemia: Secondary | ICD-10-CM

## 2021-05-25 DIAGNOSIS — I082 Rheumatic disorders of both aortic and tricuspid valves: Secondary | ICD-10-CM | POA: Diagnosis not present

## 2021-05-25 DIAGNOSIS — I110 Hypertensive heart disease with heart failure: Secondary | ICD-10-CM | POA: Insufficient documentation

## 2021-05-25 DIAGNOSIS — Z8249 Family history of ischemic heart disease and other diseases of the circulatory system: Secondary | ICD-10-CM | POA: Diagnosis not present

## 2021-05-25 DIAGNOSIS — I5032 Chronic diastolic (congestive) heart failure: Secondary | ICD-10-CM

## 2021-05-25 DIAGNOSIS — Z853 Personal history of malignant neoplasm of breast: Secondary | ICD-10-CM | POA: Diagnosis not present

## 2021-05-25 DIAGNOSIS — R42 Dizziness and giddiness: Secondary | ICD-10-CM | POA: Diagnosis not present

## 2021-05-25 DIAGNOSIS — I272 Pulmonary hypertension, unspecified: Secondary | ICD-10-CM | POA: Diagnosis not present

## 2021-05-25 DIAGNOSIS — Z79899 Other long term (current) drug therapy: Secondary | ICD-10-CM | POA: Insufficient documentation

## 2021-05-25 DIAGNOSIS — Z7951 Long term (current) use of inhaled steroids: Secondary | ICD-10-CM | POA: Insufficient documentation

## 2021-05-25 DIAGNOSIS — E785 Hyperlipidemia, unspecified: Secondary | ICD-10-CM | POA: Diagnosis not present

## 2021-05-25 DIAGNOSIS — E119 Type 2 diabetes mellitus without complications: Secondary | ICD-10-CM | POA: Insufficient documentation

## 2021-05-25 LAB — ECHOCARDIOGRAM COMPLETE
AR max vel: 2.54 cm2
AV Area VTI: 2.22 cm2
AV Area mean vel: 2.36 cm2
AV Mean grad: 1 mmHg
AV Peak grad: 1.1 mmHg
Ao pk vel: 0.53 m/s
Area-P 1/2: 4.74 cm2
S' Lateral: 2.6 cm

## 2021-05-25 LAB — BASIC METABOLIC PANEL
Anion gap: 8 (ref 5–15)
BUN: 28 mg/dL — ABNORMAL HIGH (ref 8–23)
CO2: 33 mmol/L — ABNORMAL HIGH (ref 22–32)
Calcium: 10.1 mg/dL (ref 8.9–10.3)
Chloride: 100 mmol/L (ref 98–111)
Creatinine, Ser: 1.28 mg/dL — ABNORMAL HIGH (ref 0.44–1.00)
GFR, Estimated: 42 mL/min — ABNORMAL LOW (ref >60)
Glucose, Bld: 97 mg/dL (ref 70–99)
Potassium: 4.9 mmol/L (ref 3.5–5.1)
Sodium: 141 mmol/L (ref 135–145)

## 2021-05-25 NOTE — Progress Notes (Signed)
Advanced HF Clinic Note   Date:  05/25/2021   ID:  Amanda Short, DOB Aug 22, 1940, MRN [id number]  Location: Home  Provider location: [facility] Type of Visit: Established patient  PCP:  Haydee Salter, MD  Cardiologist:  None Primary HF: Bensimhon Pulmonary: Dr Lamonte Sakai.   Chief Complaint: PAH follow-up   History of Present Illness: Amanda Short is a 80 y.o. female with DM2, HTN. OSA previously on CPAP), previous smoker (quit 1995), previous DVT/PE (2014) and h/o chronic respiratory failure s/p previous tracheostomy (removed) referred by Dr. Drue Dun for further evaluation of pulmonary HTN seen on echo.   In 2018 had severe n/v/d and led to intractable seizures. Got extubated but then developed recurrent distress but could not be re-intubated due to laryngeal swelling. Had emergent trach which was removed after several weeks. Smoked 1ppd x 20+ years. Quit in 1995.    Echo 7/21: LVEF 65-705 with restrictive diastolic filling  RV moderately enlarged with moderate HK. Mod to severe TR. Severe septal flattening RVSP estimated at 108.  CT abdomen 2/21: showed calcified granuloma in RLL VQ scan 8/21: Normal   Seen in HF clinic for the first time 10/21 and recommended R/L cath.    R/L 05/24/20 Minimal CAD (20%). Severe PAH Ao = 150/81 (109) LV =  150/18 RA =  12 RV =  94/13 PA =  92/36 (56) PCW = 11 Fick cardiac output/index = 3.0/1.8 PVR = 15.1 WU FA sat = 96% PA sat = 61%, 60%  PFTs  06/21/20 FEV1  1.11L (65%) FVC   1.65L  (71%) FEF 25-75  0.61L DLCO 35%  Post cath recommended trial of Tyvaso but wanted to maximize her oxygenation. O2 increased, Sleep study and Hi-res CT ordered. Referred to Pulmonary. Saw Dr. Lamonte Sakai and Stiolto added for COPD   HiRes chest CT 08/18/20: suggestive of early ILD and emphysema. Also with LM and 3v coronary calcifications.  Had sleep study in 2/22/ AHI 3.6   Started on Tyvaso 04/22.   Had video visit  08/22. Continue to titrate Tyvaso. Declined referral to Pulmonary Rehab.   Today she returns for [facility] follow up with her husband. Now on Tyvasso 6 puff 4 times a day. She reduced from 7 puff due to multiple side effects. Says she has mild headache with 6 puffs and feels like it is manageable. Feels a little better on Tyvasso. Occasional lightheadedness. SOB with exertion. Able to walk short distances in the house. No syncope/presyncope. Denies PND/Orthopnea. Appetite ok. No fever or chills. Weight at home fluctuates 3-4 pounds.Completed out patient physical therapy She is going to [employer] to use machines once a week.  Taking all medications.     Past Medical History:  Diagnosis Date   Abdominal pain    Abnormal CT of the abdomen    Acute blood loss anemia    Acute cystitis with hematuria 12/19/2019   Acute encephalopathy 06/06/2017   Acute pulmonary embolism ([place]) 04/01/2013   Acute respiratory failure with hypoxia (HCC)    Anxiety state    Arthritis    "back, hands" (07/22/2015)   Aspiration pneumonia ([place])    Benign essential HTN    Cancer of left breast ([place]) 2006   S/P lumpectomy   Cellulitis of foot, right 07/21/2015   Cellulitis of right foot 07/21/2015   Central retinal vein occlusion of right eye 04/20/2016   Chronic diastolic CHF (congestive heart failure) ([place]) 07/10/2017   Chronic  respiratory failure with hypoxia ([place])    Colitis 09/73/5329   Complication of anesthesia    "brief breathing problem at surgery center in ~ 2005 when I had gallbladder OR"   Concussion 03/2013   due to fall   Depression    Diabetes ([place]) 04/01/2013   Diabetes mellitus type 2 in nonobese [facility])    Diabetes mellitus type 2, controlled ([place]) 07/21/2015   Diabetic foot infection ([place]) 07/21/2015   Diverticulitis of colon    Diverticulitis of large intestine without perforation or abscess without bleeding    Diverticulosis    DVT (deep venous thrombosis) ([place]) 03/2013   LLE   Encephalopathy     Encounter for attention to tracheostomy [facility])    Encounter for central line placement    Encounter for orogastric (OG) tube placement    Essential hypertension 04/01/2013   Essential hypertension, benign 04/01/2013   GERD (gastroesophageal reflux disease)    History of acute respiratory failure    History of breast cancer 2006   S/P lumpectomy   History of pulmonary embolism    HX: breast cancer 04/01/2013   Hyperlipidemia    Hypertension    Hypocalcemia 05/27/2017   Hypokalemia 07/10/2017   Hypomagnesemia 07/11/2017   Hypothyroidism    Hypoxia 03/23/2020   Nausea vomiting and diarrhea 07/10/2017   Non-intractable cyclical vomiting with nausea    Nuclear sclerotic cataract of both eyes 04/20/2016   OSA (obstructive sleep apnea) 07/10/2017   OSA on CPAP 04/01/2013   OSA treated with BiPAP    Physical debility 06/23/2017   Prolonged QT interval 05/27/2017   Pulmonary embolism ([place]) 03/2013   Pulmonary HTN ([place]) 07/05/2017   Pulmonary hypertension ([place]) 07/05/2017   Pulmonary nodule 07/26/2020   Seizure ([place]) 07/10/2017   Seizure disorder ([place]) 07/05/2017   Seizures ([place])    Sepsis ([place]) 07/10/2017   Sleep apnea    Status epilepticus ([place])    Status post trachelectomy 06/23/2017   Tachypnea    Thyroid disease    Hypothyroid   Tracheostomy, acute management ([place])    Type II diabetes mellitus ([place])    Past Surgical History:  Procedure Laterality Date   ABDOMINAL HYSTERECTOMY  1970s   BREAST BIOPSY Left 2006   BREAST LUMPECTOMY Left 2006   BUNIONECTOMY WITH HAMMERTOE RECONSTRUCTION Left    CARDIAC CATHETERIZATION  06/2020   ESOPHAGOGASTRODUODENOSCOPY N/A 05/29/2017   Procedure: ESOPHAGOGASTRODUODENOSCOPY (EGD);  Surgeon: Irene Shipper, MD;  Location: [facility];  Service: Endoscopy;  Laterality: N/A;   JOINT REPLACEMENT     LAPAROSCOPIC CHOLECYSTECTOMY  ~ 2005   Pulmonary function test  2-3 weeks ago   RIGHT/LEFT HEART CATH AND CORONARY ANGIOGRAPHY N/A 05/24/2020   Procedure:  RIGHT/LEFT HEART CATH AND CORONARY ANGIOGRAPHY;  Surgeon: Jolaine Artist, MD;  Location: [facility];  Service: Cardiovascular;  Laterality: N/A;   TOTAL HIP ARTHROPLASTY Left 2010   TOTAL KNEE ARTHROPLASTY Bilateral 2005-2006     Current Outpatient Medications  Medication Sig Dispense Refill   acetaminophen (TYLENOL) 500 MG tablet Take 500-1,000 mg by mouth every 6 (six) hours as needed for mild pain.      ALPRAZolam (XANAX) 0.5 MG tablet TAKE 1 TABLET (0.5 MG TOTAL) BY MOUTH AT BEDTIME AS NEEDED FOR SLEEP. 30 tablet 0   atorvastatin (LIPITOR) 20 MG tablet Take 20 mg by mouth daily.     benzonatate (TESSALON PERLES) 100 MG capsule Take 1 capsule (100 mg total) by mouth 3 (three) times daily as needed. (  Patient taking differently: Take 100 mg by mouth 3 (three) times daily as needed for cough.) 20 capsule 0   calcium-vitamin D (OSCAL WITH D) 500-200 MG-UNIT tablet Take 1 tablet by mouth 2 (two) times daily.     diltiazem (CARDIZEM) 30 MG tablet TAKE 1 TABLET BY MOUTH TWICE A DAY 180 tablet 3   DULoxetine (CYMBALTA) 60 MG capsule Take 1 capsule (60 mg total) by mouth daily. 90 capsule 3   febuxostat (ULORIC) 40 MG tablet TAKE 1 TABLET BY MOUTH EVERY DAY 90 tablet 3   furosemide (LASIX) 40 MG tablet Take 40 mg by mouth daily.     insulin degludec (TRESIBA FLEXTOUCH) 100 UNIT/ML SOPN FlexTouch Pen Inject 24 Units into the skin daily.     levETIRAcetam (KEPPRA) 500 MG tablet Take 500 mg by mouth 2 (two) times daily.     levothyroxine (SYNTHROID) 100 MCG tablet Take 1 tablet (100 mcg total) by mouth daily before breakfast. 90 tablet 3   loratadine (CLARITIN) 10 MG tablet Take 10 mg by mouth daily as needed for allergies.     metoprolol succinate (TOPROL XL) 50 MG 24 hr tablet Take 1 tablet (50 mg total) by mouth daily. Take with or immediately following a meal. 90 tablet 3   montelukast (SINGULAIR) 10 MG tablet Take 10 mg at bedtime by mouth.     Multiple Vitamin (MULTIVITAMIN WITH  MINERALS) TABS tablet Take 1 tablet by mouth daily. Unknown strengfth     nateglinide (STARLIX) 120 MG tablet Take 1 tablet (120 mg total) by mouth daily. 30 tablet 3   omeprazole (PRILOSEC) 40 MG capsule Take 40 mg by mouth 2 (two) times daily.     OXYGEN Inhale 4 L/min into the lungs as directed. 24 hours a day     spironolactone (ALDACTONE) 25 MG tablet Take 12.5 mg by mouth daily.     STIOLTO RESPIMAT 2.5-2.5 MCG/ACT AERS INHALE 2 PUFFS BY MOUTH INTO THE LUNGS DAILY 1 each 5   Treprostinil (TYVASO) 0.6 MG/ML SOLN Inhale 18 mcg into the lungs in the morning, at noon, in the evening, and at bedtime.     XARELTO 15 MG TABS tablet TAKE 1 TABLET BY MOUTH EVERY DAY (Patient taking differently: daily with supper.) 90 tablet 3   No current facility-administered medications for this encounter.    Allergies:   Allopurinol and Aspirin   Social History:  The patient  reports that she quit smoking about 27 years ago. Her smoking use included cigarettes. She has a 30.00 pack-year smoking history. She has never used smokeless tobacco. She reports that she does not drink alcohol and does not use drugs.   Family History:  The patient's family history includes Breast cancer in her cousin and sister; Colon cancer in her maternal grandmother; Diabetes in her sister and another family member; Hypertension in her mother; Kidney disease in her sister; Liver cancer in her mother; Uterine cancer in her mother.   ROS:  Please see the history of present illness.   All other systems are personally reviewed and negative.   Vitals:   05/25/21 1431  BP: 138/78  Pulse: 74  SpO2: 98%  Weight: 66.7 kg (147 lb)    Lab Results  Component Value Date   CREATININE 1.35 (H) 04/01/2021   CREATININE 1.45 (H) 08/27/2020   CREATININE 1.26 (H) 07/13/2020    Exam:  General:  Arrived in a wheelchair. No resp difficulty HEENT: normal Neck: supple. JVP 7-8 . Carotids 2+ bilat;  no bruits. No lymphadenopathy or thryomegaly  appreciated. Cor: PMI nondisplaced. Regular rate & rhythm. No rubs, gallops or murmurs. Lungs: clear on 4 liters .  Abdomen: soft, nontender, nondistended. No hepatosplenomegaly. No bruits or masses. Good bowel sounds. Extremities: no cyanosis, clubbing, rash, edema Neuro: alert & orientedx3, cranial nerves grossly intact. moves all 4 extremities w/o difficulty. Affect pleasant.   Recent Labs: 04/01/2021: BUN 29; Creatinine, Ser 1.35; Potassium 5.3 No hemolysis seen; Sodium 142; TSH 0.53  Personally reviewed   Wt Readings from Last 3 Encounters:  05/25/21 66.7 kg (147 lb)  05/23/21 66.7 kg (147 lb)  03/18/21 68.3 kg (150 lb 9.6 oz)      ASSESSMENT AND PLAN:   1. Pulmonary arterial HTN with cor pulmonale, severe  - Echo 7/21: LVEF 65-70% with restrictive diastolic filling  RV moderately enlarged with moderate HK. Mod to severe TR. Severe septal flattening RVSP estimated at 108.  - CT abdomen 2/21: showed calcified granuloma in RLL - VQ scan 8/21: Normal - RHC 11/21 with severe PAH PA =  92/36 (56) PCW = 11 CI 1.8 PVR 15 - Continue supplemental O2. Remains on 4L O2 with stable saturations.  - PFTs 12/21 show mild to moderate obstruction but no hyperinflation. - Auto-immune serology negative - HiRes chest CT 08/18/20: suggestive of early ILD and emphysema. 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 - She has benefited Stiolto and increased O2 supplementation.  - Continue Tyvaso 6 puffs 4 times a day. Intolerant 7 puffs due to multiple side effects.  - NYHA III. Volume status stable. Continue lasix and spiro at current dose.  - Discussed Pulmonary Rehab. She would like to hold off until the beginning of  the year.  - Dr Haroldine Laws will review Echo and call with results.   - Next visit obtain 6 MW. Will need RHC soon to re-evaluate hemodynamics.    2. Chronic hypoxic respiratory failure - quit tobacco 1995 - on home O2 @ 4L encouraged her to wear O2 and check sats to keep > 90% - has h/o tracheostomy - PFTs 12/21 show mild to moderate obstruction but no hyperinflation.  - HiRes chest CT 08/18/20: suggestive of early ILD and emphysema. Also with LM and 3v coronary calcifications,  - Following with Dr. Lamonte Sakai in Pulmonary. Has benefitted from Ava stable today.     3. OSA - AH 2/2 3.6 -> no significant OSA  4. Hyperkalemia Had BMET in September with elevated potassium.  Check BMET today. If K high will need to stop spironolactone.    Follow up 4 weeks with Dr Haroldine Laws.  Jannetta Massey NP-C  3:19 PM

## 2021-05-25 NOTE — Progress Notes (Signed)
  Echocardiogram 2D Echocardiogram has been performed.  Darlina Sicilian M 05/25/2021, 1:39 PM

## 2021-05-25 NOTE — Patient Instructions (Signed)
Labs done today. We will contact you only if your labs are abnormal.  A 6 minute walk test will be done at your next appointment.   No medication changes were made. Please continue all current medications as prescribed.  Your physician recommends that you schedule a follow-up appointment in: 4 weeks with Dr. Haroldine Laws  If you have any questions or concerns before your next appointment please send [place] a message through [facility] or call our office at [phone].    TO LEAVE A MESSAGE FOR THE NURSE SELECT OPTION 2, PLEASE LEAVE A MESSAGE INCLUDING: YOUR NAME DATE OF BIRTH CALL BACK NUMBER REASON FOR CALL**this is important as we prioritize the call backs  YOU WILL RECEIVE A CALL BACK THE SAME DAY AS LONG AS YOU CALL BEFORE 4:00 PM   Do the following things EVERYDAY: Weigh yourself in the morning before breakfast. Write it down and keep it in a log. Take your medicines as prescribed Eat low salt foods--Limit salt (sodium) to 2000 mg per day.  Stay as active as you can everyday Limit all fluids for the day to less than 2 liters   At the [facility], you and your health needs are our priority. As part of our continuing mission to provide you with exceptional heart care, we have created designated Provider Care Teams. These Care Teams include your primary Cardiologist (physician) and Advanced Practice Providers (APPs- Physician Assistants and Nurse Practitioners) who all work together to provide you with the care you need, when you need it.   You may see any of the following providers on your designated Care Team at your next follow up: Dr Glori Bickers Dr Haynes Kerns, NP Lyda Jester, [place] Audry Riles, PharmD   Please be sure to bring in all your medications bottles to every appointment.

## 2021-06-02 ENCOUNTER — Encounter: Payer: Self-pay | Admitting: Physical Therapy

## 2021-06-15 ENCOUNTER — Other Ambulatory Visit: Payer: Self-pay | Admitting: Family

## 2021-06-15 DIAGNOSIS — F5101 Primary insomnia: Secondary | ICD-10-CM

## 2021-06-17 ENCOUNTER — Encounter: Payer: Medicare PPO | Admitting: Family Medicine

## 2021-06-21 ENCOUNTER — Encounter (HOSPITAL_COMMUNITY): Payer: Medicare PPO | Admitting: Internal Medicine

## 2021-06-28 ENCOUNTER — Telehealth (HOSPITAL_COMMUNITY): Payer: Self-pay | Admitting: *Deleted

## 2021-06-28 NOTE — Telephone Encounter (Signed)
Pt left vm stating Dr.Bensimhon had to leave for an emergency during her office visit and she wanted to know her echo results and his thoughts moving forward with her care.  Routed to [place]

## 2021-06-29 ENCOUNTER — Encounter (HOSPITAL_COMMUNITY): Payer: Self-pay | Admitting: *Deleted

## 2021-06-29 NOTE — Telephone Encounter (Signed)
Left detailed vm. Mychart message also sent to pt.

## 2021-07-14 ENCOUNTER — Ambulatory Visit: Payer: Medicare PPO

## 2021-07-16 ENCOUNTER — Other Ambulatory Visit: Payer: Self-pay | Admitting: Family Medicine

## 2021-07-16 DIAGNOSIS — E1122 Type 2 diabetes mellitus with diabetic chronic kidney disease: Secondary | ICD-10-CM

## 2021-07-16 DIAGNOSIS — N182 Chronic kidney disease, stage 2 (mild): Secondary | ICD-10-CM

## 2021-07-18 ENCOUNTER — Other Ambulatory Visit: Payer: Self-pay | Admitting: Family Medicine

## 2021-07-18 ENCOUNTER — Other Ambulatory Visit: Payer: Self-pay | Admitting: Cardiology

## 2021-07-18 DIAGNOSIS — F5101 Primary insomnia: Secondary | ICD-10-CM

## 2021-07-28 ENCOUNTER — Encounter: Payer: Self-pay | Admitting: Physical Therapy

## 2021-08-04 ENCOUNTER — Encounter (HOSPITAL_COMMUNITY): Payer: Medicare PPO | Admitting: Internal Medicine

## 2021-08-12 ENCOUNTER — Telehealth (HOSPITAL_COMMUNITY): Payer: Self-pay | Admitting: Vascular Surgery

## 2021-08-12 NOTE — Telephone Encounter (Signed)
Left pt VM to resch 3/6 appt to another day due to provider being out of town

## 2021-08-13 ENCOUNTER — Encounter: Payer: Self-pay | Admitting: Physical Therapy

## 2021-08-15 ENCOUNTER — Other Ambulatory Visit: Payer: Self-pay | Admitting: Family Medicine

## 2021-08-15 DIAGNOSIS — F5101 Primary insomnia: Secondary | ICD-10-CM

## 2021-08-15 NOTE — Telephone Encounter (Signed)
Refill request for: Alprazolam 0.5 mg LR 07/18/21 LOV 03/18/21 FOV 08/24/21  Please review and advise.  Thanks. Dm/cma

## 2021-08-24 ENCOUNTER — Encounter: Payer: Medicare PPO | Admitting: Family Medicine

## 2021-08-24 ENCOUNTER — Telehealth: Payer: Self-pay | Admitting: Family Medicine

## 2021-08-24 NOTE — Telephone Encounter (Signed)
Pt's husband called in to reschedule her app, she is throwing up this morning and feeling sick overall. I offered a mychart visit, he declined. She did reschedule.

## 2021-09-14 ENCOUNTER — Other Ambulatory Visit: Payer: Self-pay | Admitting: Family Medicine

## 2021-09-14 DIAGNOSIS — F5101 Primary insomnia: Secondary | ICD-10-CM

## 2021-09-16 NOTE — Addendum Note (Signed)
Encounter addended by: Annie Paras on: 09/16/2021 3:55 PM  Actions taken: Letter saved

## 2021-09-19 ENCOUNTER — Encounter (HOSPITAL_COMMUNITY): Payer: Medicare PPO | Admitting: Internal Medicine

## 2021-09-29 ENCOUNTER — Other Ambulatory Visit: Payer: Self-pay | Admitting: Family Medicine

## 2021-09-29 ENCOUNTER — Encounter (HOSPITAL_COMMUNITY): Payer: Medicare PPO | Admitting: Internal Medicine

## 2021-09-30 ENCOUNTER — Encounter (HOSPITAL_COMMUNITY): Payer: Medicare PPO | Admitting: Internal Medicine

## 2021-10-04 ENCOUNTER — Other Ambulatory Visit: Payer: Self-pay | Admitting: Family Medicine

## 2021-10-04 DIAGNOSIS — E1122 Type 2 diabetes mellitus with diabetic chronic kidney disease: Secondary | ICD-10-CM

## 2021-10-06 ENCOUNTER — Encounter (HOSPITAL_COMMUNITY): Payer: Medicare PPO | Admitting: Internal Medicine

## 2021-10-06 ENCOUNTER — Encounter: Payer: Self-pay | Admitting: Physical Therapy

## 2021-10-10 ENCOUNTER — Other Ambulatory Visit: Payer: Self-pay | Admitting: Family Medicine

## 2021-10-10 DIAGNOSIS — M1990 Unspecified osteoarthritis, unspecified site: Secondary | ICD-10-CM

## 2021-10-10 DIAGNOSIS — E039 Hypothyroidism, unspecified: Secondary | ICD-10-CM

## 2021-10-11 ENCOUNTER — Encounter: Payer: Medicare PPO | Admitting: Family Medicine

## 2021-10-15 ENCOUNTER — Other Ambulatory Visit: Payer: Self-pay | Admitting: Family Medicine

## 2021-10-15 DIAGNOSIS — E1122 Type 2 diabetes mellitus with diabetic chronic kidney disease: Secondary | ICD-10-CM

## 2021-10-19 ENCOUNTER — Encounter: Payer: Self-pay | Admitting: Physical Therapy

## 2021-10-21 ENCOUNTER — Ambulatory Visit: Payer: Medicare PPO | Admitting: Cardiology

## 2021-10-27 ENCOUNTER — Encounter: Payer: Medicare PPO | Admitting: Family Medicine

## 2021-11-04 ENCOUNTER — Other Ambulatory Visit: Payer: Self-pay | Admitting: Family Medicine

## 2021-11-04 DIAGNOSIS — E1122 Type 2 diabetes mellitus with diabetic chronic kidney disease: Secondary | ICD-10-CM

## 2021-11-06 ENCOUNTER — Encounter (HOSPITAL_COMMUNITY): Payer: Self-pay | Admitting: Emergency Medicine

## 2021-11-06 ENCOUNTER — Emergency Department (HOSPITAL_COMMUNITY)
Admission: EM | Admit: 2021-11-06 | Discharge: 2021-11-06 | Disposition: A | Discharge status: Home / Self Care | Payer: Medicare PPO | Source: Home / Self Care | Attending: Emergency Medicine | Admitting: Emergency Medicine

## 2021-11-06 ENCOUNTER — Emergency Department (HOSPITAL_COMMUNITY): Payer: Medicare PPO

## 2021-11-06 ENCOUNTER — Other Ambulatory Visit: Payer: Self-pay

## 2021-11-06 DIAGNOSIS — J441 Chronic obstructive pulmonary disease with (acute) exacerbation: Secondary | ICD-10-CM | POA: Diagnosis not present

## 2021-11-06 DIAGNOSIS — E039 Hypothyroidism, unspecified: Secondary | ICD-10-CM | POA: Insufficient documentation

## 2021-11-06 DIAGNOSIS — R059 Cough, unspecified: Secondary | ICD-10-CM | POA: Insufficient documentation

## 2021-11-06 DIAGNOSIS — Z79899 Other long term (current) drug therapy: Secondary | ICD-10-CM | POA: Insufficient documentation

## 2021-11-06 DIAGNOSIS — Z20822 Contact with and (suspected) exposure to covid-19: Secondary | ICD-10-CM | POA: Insufficient documentation

## 2021-11-06 DIAGNOSIS — I130 Hypertensive heart and chronic kidney disease with heart failure and stage 1 through stage 4 chronic kidney disease, or unspecified chronic kidney disease: Secondary | ICD-10-CM | POA: Insufficient documentation

## 2021-11-06 DIAGNOSIS — Z7901 Long term (current) use of anticoagulants: Secondary | ICD-10-CM | POA: Insufficient documentation

## 2021-11-06 DIAGNOSIS — M7989 Other specified soft tissue disorders: Secondary | ICD-10-CM | POA: Insufficient documentation

## 2021-11-06 DIAGNOSIS — E1122 Type 2 diabetes mellitus with diabetic chronic kidney disease: Secondary | ICD-10-CM | POA: Insufficient documentation

## 2021-11-06 DIAGNOSIS — N1832 Chronic kidney disease, stage 3b: Secondary | ICD-10-CM | POA: Insufficient documentation

## 2021-11-06 DIAGNOSIS — I5033 Acute on chronic diastolic (congestive) heart failure: Secondary | ICD-10-CM

## 2021-11-06 DIAGNOSIS — R2243 Localized swelling, mass and lump, lower limb, bilateral: Secondary | ICD-10-CM | POA: Insufficient documentation

## 2021-11-06 DIAGNOSIS — I509 Heart failure, unspecified: Secondary | ICD-10-CM | POA: Diagnosis not present

## 2021-11-06 LAB — COMPREHENSIVE METABOLIC PANEL
ALT: 27 U/L (ref 0–44)
AST: 42 U/L — ABNORMAL HIGH (ref 15–41)
Albumin: 3.3 g/dL — ABNORMAL LOW (ref 3.5–5.0)
Alkaline Phosphatase: 70 U/L (ref 38–126)
Anion gap: 7 (ref 5–15)
BUN: 25 mg/dL — ABNORMAL HIGH (ref 8–23)
CO2: 28 mmol/L (ref 22–32)
Calcium: 9 mg/dL (ref 8.9–10.3)
Chloride: 101 mmol/L (ref 98–111)
Creatinine, Ser: 1.32 mg/dL — ABNORMAL HIGH (ref 0.44–1.00)
GFR, Estimated: 41 mL/min — ABNORMAL LOW (ref >60)
Glucose, Bld: 80 mg/dL (ref 70–99)
Potassium: 3.9 mmol/L (ref 3.5–5.1)
Sodium: 136 mmol/L (ref 135–145)
Total Bilirubin: 0.9 mg/dL (ref 0.3–1.2)
Total Protein: 5.8 g/dL — ABNORMAL LOW (ref 6.5–8.1)

## 2021-11-06 LAB — BRAIN NATRIURETIC PEPTIDE: B Natriuretic Peptide: 2981.9 pg/mL — ABNORMAL HIGH (ref 0.0–100.0)

## 2021-11-06 LAB — CBC WITH DIFFERENTIAL/PLATELET
Abs Immature Granulocytes: 0.02 10*3/uL (ref 0.00–0.07)
Basophils Absolute: 0.1 10*3/uL (ref 0.0–0.1)
Basophils Relative: 1 %
Eosinophils Absolute: 0.1 10*3/uL (ref 0.0–0.5)
Eosinophils Relative: 1 %
HCT: 39.4 % (ref 36.0–46.0)
Hemoglobin: 12 g/dL (ref 12.0–15.0)
Immature Granulocytes: 0 %
Lymphocytes Relative: 16 %
Lymphs Abs: 1.5 10*3/uL (ref 0.7–4.0)
MCH: 27.8 pg (ref 26.0–34.0)
MCHC: 30.5 g/dL (ref 30.0–36.0)
MCV: 91.2 fL (ref 80.0–100.0)
Monocytes Absolute: 1.3 10*3/uL — ABNORMAL HIGH (ref 0.1–1.0)
Monocytes Relative: 14 %
Neutro Abs: 6.3 10*3/uL (ref 1.7–7.7)
Neutrophils Relative %: 68 %
Platelets: 257 10*3/uL (ref 150–400)
RBC: 4.32 MIL/uL (ref 3.87–5.11)
RDW: 16.9 % — ABNORMAL HIGH (ref 11.5–15.5)
WBC: 9.3 10*3/uL (ref 4.0–10.5)
nRBC: 0.3 % — ABNORMAL HIGH (ref 0.0–0.2)

## 2021-11-06 LAB — TROPONIN I (HIGH SENSITIVITY)
Troponin I (High Sensitivity): 48 ng/L — ABNORMAL HIGH (ref <18)
Troponin I (High Sensitivity): 45 ng/L — ABNORMAL HIGH (ref <18)

## 2021-11-06 LAB — RESP PANEL BY RT-PCR (FLU A&B, COVID) ARPGX2
Influenza A by PCR: NEGATIVE
Influenza B by PCR: NEGATIVE
SARS Coronavirus 2 by RT PCR: NEGATIVE

## 2021-11-06 MED ORDER — IPRATROPIUM-ALBUTEROL 0.5-2.5 (3) MG/3ML IN SOLN
3.0000 mL | Freq: Once | RESPIRATORY_TRACT | Status: AC
  Administered 2021-11-06: 3 mL via RESPIRATORY_TRACT
  Filled 2021-11-06: qty 3

## 2021-11-06 MED ORDER — FUROSEMIDE 10 MG/ML IJ SOLN
40.0000 mg | Freq: Once | INTRAMUSCULAR | Status: AC
  Administered 2021-11-06: 40 mg via INTRAVENOUS
  Filled 2021-11-06: qty 4

## 2021-11-06 NOTE — ED Notes (Signed)
RN reviewed discharge instructions with pt. Pt verbalized understanding and had no further questions. VSS upon discharge. ?

## 2021-11-06 NOTE — ED Provider Notes (Signed)
?[facility] ?Provider Note ? ? ?CSN: [id number] ?Arrival date & time: 11/06/21  1751 ? ?  ? ?History ? ?Chief Complaint  ?Patient presents with  ? Shortness of Breath  ? ? ?Amanda Short is a 81 y.o. female. ? ?HPI ?81 year old female with a history of chronic respiratory failure on 4 L of oxygen, diastolic CHF, pulmonary hypertension, and multiple other comorbidities presents with shortness of breath.  She chronically is short of breath but has been worse since yesterday.  She had a lot of difficulty sleeping last night.  Some cough with productive sputum but she is not sure what color.  She has had on and off chest tightness and a heaviness in her chest.  Symptoms dramatically improved when EMS put them on their oxygen and they ultimately put her on 6 L due to sats in the high 80s on her home oxygen.  No chest pain currently.  She has had some wheezing.  When asked about leg swelling, which seems to be present now, the patient states that she has not seen this recently and the husband states her legs have not been swollen recently.  No fevers though she gets chronic night sweats which have been stronger over the last couple nights. ? ?Home Medications ?Prior to Admission medications   ?Medication Sig Start Date End Date Taking? Authorizing Provider  ?acetaminophen (TYLENOL) 500 MG tablet Take 500-1,000 mg by mouth every 6 (six) hours as needed for mild pain.     [provider]  ?ALPRAZolam Duanne Moron) 0.5 MG tablet TAKE 1 TABLET (0.5 MG TOTAL) BY MOUTH AT BEDTIME AS NEEDED FOR SLEEP. 09/14/21   Haydee Salter, MD  ?atorvastatin (LIPITOR) 20 MG tablet Take 20 mg by mouth daily.    [provider]  ?benzonatate (TESSALON PERLES) 100 MG capsule Take 1 capsule (100 mg total) by mouth 3 (three) times daily as needed. ?Patient taking differently: Take 100 mg by mouth 3 (three) times daily as needed for cough. 09/09/20   Lucretia Kern, DO  ?calcium-vitamin D (OSCAL  WITH D) 500-200 MG-UNIT tablet Take 1 tablet by mouth 2 (two) times daily.    [provider]  ?diltiazem (CARDIZEM) 30 MG tablet TAKE 1 TABLET BY MOUTH TWICE A DAY 04/18/21   Haydee Salter, MD  ?DULoxetine (CYMBALTA) 60 MG capsule TAKE 1 CAPSULE BY MOUTH EVERY DAY 10/10/21   Haydee Salter, MD  ?febuxostat (ULORIC) 40 MG tablet TAKE 1 TABLET BY MOUTH EVERY DAY 04/18/21   Haydee Salter, MD  ?furosemide (LASIX) 40 MG tablet Take 40 mg by mouth daily.    [provider]  ?levETIRAcetam (KEPPRA) 500 MG tablet Take 500 mg by mouth 2 (two) times daily.    [provider]  ?levothyroxine (SYNTHROID) 100 MCG tablet TAKE 1 TABLET BY MOUTH DAILY BEFORE BREAKFAST. 10/10/21   Haydee Salter, MD  ?loratadine (CLARITIN) 10 MG tablet Take 10 mg by mouth daily as needed for allergies.    [provider]  ?metoprolol succinate (TOPROL-XL) 50 MG 24 hr tablet Take 1 tablet (50 mg total) by mouth daily. 07/19/21   Park Liter, MD  ?montelukast (SINGULAIR) 10 MG tablet Take 10 mg at bedtime by mouth.    [provider]  ?Multiple Vitamin (MULTIVITAMIN WITH MINERALS) TABS tablet Take 1 tablet by mouth daily. Unknown strengfth    [provider]  ?nateglinide (STARLIX) 120 MG tablet TAKE 1 TABLET BY MOUTH EVERY DAY 07/16/21  Haydee Salter, MD  ?omeprazole (PRILOSEC) 40 MG capsule Take 40 mg by mouth 2 (two) times daily.    [provider]  ?OXYGEN Inhale 4 L/min into the lungs as directed. 24 hours a day    [provider]  ?spironolactone (ALDACTONE) 25 MG tablet Take 12.5 mg by mouth daily.    [provider]  ?STIOLTO RESPIMAT 2.5-2.5 MCG/ACT AERS INHALE 2 PUFFS BY MOUTH INTO THE LUNGS DAILY 02/15/21   Collene Gobble, MD  ?Treprostinil (TYVASO) 0.6 MG/ML SOLN Inhale 18 mcg into the lungs in the morning, at noon, in the evening, and at bedtime. 10/21/20   Bensimhon, Shaune Pascal, MD  ?TRESIBA FLEXTOUCH 100 UNIT/ML FlexTouch Pen INJECT 28 UNITS DAILY OR  AS DIRECTED 10/17/21   Haydee Salter, MD  ?Alveda Reasons 15 MG TABS tablet TAKE 1 TABLET BY MOUTH EVERY DAY ?Patient taking differently: daily with supper. 02/14/21   Haydee Salter, MD  ?   ? ?Allergies    ?Allopurinol and Aspirin   ? ?Review of Systems   ?Review of Systems  ?Constitutional:  Negative for fever.  ?Respiratory:  Positive for cough, chest tightness, shortness of breath and wheezing.   ?Cardiovascular:  Positive for chest pain and leg swelling.  ? ?Physical Exam ?Updated Vital Signs ?BP 127/71   Pulse 96   Temp 98.4 ?F (36.9 ?C) (Oral)   Resp 16   Ht _0  (1.549 m)   Wt 65.8 kg   SpO2 97%   BMI 27.40 kg/m?  ?Physical Exam ?Vitals and nursing note reviewed.  ?Constitutional:   ?   General: She is not in acute distress. ?   Appearance: She is well-developed. She is not ill-appearing or diaphoretic.  ?HENT:  ?   Head: Normocephalic and atraumatic.  ?Cardiovascular:  ?   Rate and Rhythm: Normal rate and regular rhythm.  ?   Pulses:     ?     Dorsalis pedis pulses are 2+ on the right side and 2+ on the left side.  ?   Heart sounds: Normal heart sounds.  ?Pulmonary:  ?   Effort: Pulmonary effort is normal. No tachypnea, accessory muscle usage or respiratory distress.  ?   Breath sounds: Decreased breath sounds (mild, diffuse) present.  ?Abdominal:  ?   Palpations: Abdomen is soft.  ?   Tenderness: There is no abdominal tenderness.  ?Musculoskeletal:  ?   Right lower leg: Edema present.  ?   Left lower leg: Edema present.  ?   Comments: Mild discoloration to the distal feet which is chronic per patient and husband.  Otherwise has pitting edema up to the lower legs just inferior to the knees bilaterally.  ?Skin: ?   General: Skin is warm and dry.  ?Neurological:  ?   Mental Status: She is alert.  ? ? ?ED Results / Procedures / Treatments   ?Labs ?(all labs ordered are listed, but only abnormal results are displayed) ?Labs Reviewed  ?COMPREHENSIVE METABOLIC PANEL - Abnormal; Notable for the following  components:  ?    Result Value  ? BUN 25 (*)   ? Creatinine, Ser 1.32 (*)   ? Total Protein 5.8 (*)   ? Albumin 3.3 (*)   ? AST 42 (*)   ? GFR, Estimated 41 (*)   ? All other components within normal limits  ?BRAIN NATRIURETIC PEPTIDE - Abnormal; Notable for the following components:  ? B Natriuretic Peptide 2,981.9 (*)   ? All other  components within normal limits  ?CBC WITH DIFFERENTIAL/PLATELET - Abnormal; Notable for the following components:  ? RDW 16.9 (*)   ? nRBC 0.3 (*)   ? Monocytes Absolute 1.3 (*)   ? All other components within normal limits  ?TROPONIN I (HIGH SENSITIVITY) - Abnormal; Notable for the following components:  ? Troponin I (High Sensitivity) 48 (*)   ? All other components within normal limits  ?TROPONIN I (HIGH SENSITIVITY) - Abnormal; Notable for the following components:  ? Troponin I (High Sensitivity) 45 (*)   ? All other components within normal limits  ?RESP PANEL BY RT-PCR (FLU A&B, COVID) ARPGX2  ? ? ?EKG ?EKG Interpretation ? ?Date/Time:  Sunday November 06 2021 19:56:53 EDT ?Ventricular Rate:  95 ?PR Interval:  147 ?QRS Duration: 106 ?QT Interval:  359 ?QTC Calculation: 452 ?R Axis:   130 ?Text Interpretation: Sinus rhythm Probable left atrial enlargement Right ventricular hypertrophy ST changes improved since 2021 Confirmed by Sherwood Gambler [phone]) on 11/06/2021 8:09:04 PM ? ?Radiology ?DG Chest 2 View ? ?Result Date: 11/06/2021 ?CLINICAL DATA:  Dyspnea. EXAM: CHEST - 2 VIEW COMPARISON:  February 27, 2020 FINDINGS: Tortuosity of the aorta. Cardiomediastinal silhouette is mildly enlarged. Mediastinal contours appear intact. There is no evidence of focal airspace consolidation, pleural effusion or pneumothorax. Osseous structures are without acute abnormality. Soft tissues are grossly normal. IMPRESSION: 1. Mildly enlarged cardiac silhouette. 2. No evidence of focal airspace consolidation. Electronically Signed   By: Fidela Salisbury M.D.   On: 11/06/2021 19:14    ? ?Procedures ?Procedures  ? ? ?Medications Ordered in ED ?Medications  ?ipratropium-albuterol (DUONEB) 0.5-2.5 (3) MG/3ML nebulizer solution 3 mL (3 mLs Nebulization Given 11/06/21 1901)  ?furosemide (LASIX) injection 40 mg (40 mg Int

## 2021-11-06 NOTE — ED Triage Notes (Signed)
PT to ER via EMS from home with c/o increasing SHOB over last several months, getting significantly worse last night. Pt also reports weakness.  EMS reports pt on 2-4 L chronically, per fire sata on arrival 89% on same, pt increased to 6L and states she is improved with same. Pt also reports increase in weakness. ?

## 2021-11-06 NOTE — Discharge Instructions (Signed)
Increase your Lasix/furosemide from a half a tablet up to a full tablet (40 mg) for the next 3 days.  Call your cardiologist tomorrow for further details on treatment. ? ?If at any point you develop new or worsening shortness of breath, low oxygen saturations, chest pain, fevers, worsening leg swelling, or any other new/concerning symptoms then return to the ER for evaluation. ?

## 2021-11-06 NOTE — ED Notes (Addendum)
Pt ambulated with this RN while on 4L [place]. Pt O2 88-90%  while ambulating. O2 maintained 88% after pt was assisted back to bed. O2 adjusted to 5L, O2 92%. Regenia Skeeter MD made aware.  ?

## 2021-11-06 NOTE — ED Notes (Signed)
Pt stated that she would like to go home. Fuller Plan MD made aware. ?

## 2021-11-07 ENCOUNTER — Other Ambulatory Visit: Payer: Self-pay

## 2021-11-07 ENCOUNTER — Emergency Department (HOSPITAL_COMMUNITY): Payer: Medicare PPO

## 2021-11-07 ENCOUNTER — Encounter (HOSPITAL_COMMUNITY): Payer: Self-pay

## 2021-11-07 ENCOUNTER — Telehealth (HOSPITAL_COMMUNITY): Payer: Self-pay | Admitting: *Deleted

## 2021-11-07 ENCOUNTER — Inpatient Hospital Stay (HOSPITAL_COMMUNITY)
Admission: EM | Admit: 2021-11-07 | Discharge: 2021-11-14 | DRG: 208 | Disposition: E | Payer: Medicare PPO | Attending: Pulmonary Disease | Admitting: Pulmonary Disease

## 2021-11-07 DIAGNOSIS — I5033 Acute on chronic diastolic (congestive) heart failure: Secondary | ICD-10-CM | POA: Diagnosis present

## 2021-11-07 DIAGNOSIS — R578 Other shock: Secondary | ICD-10-CM | POA: Diagnosis not present

## 2021-11-07 DIAGNOSIS — Z86718 Personal history of other venous thrombosis and embolism: Secondary | ICD-10-CM

## 2021-11-07 DIAGNOSIS — Z888 Allergy status to other drugs, medicaments and biological substances status: Secondary | ICD-10-CM

## 2021-11-07 DIAGNOSIS — J441 Chronic obstructive pulmonary disease with (acute) exacerbation: Principal | ICD-10-CM

## 2021-11-07 DIAGNOSIS — K219 Gastro-esophageal reflux disease without esophagitis: Secondary | ICD-10-CM | POA: Diagnosis present

## 2021-11-07 DIAGNOSIS — I50811 Acute right heart failure: Secondary | ICD-10-CM | POA: Diagnosis present

## 2021-11-07 DIAGNOSIS — Z96653 Presence of artificial knee joint, bilateral: Secondary | ICD-10-CM | POA: Diagnosis present

## 2021-11-07 DIAGNOSIS — I468 Cardiac arrest due to other underlying condition: Secondary | ICD-10-CM | POA: Diagnosis not present

## 2021-11-07 DIAGNOSIS — F419 Anxiety disorder, unspecified: Secondary | ICD-10-CM | POA: Diagnosis present

## 2021-11-07 DIAGNOSIS — I2729 Other secondary pulmonary hypertension: Secondary | ICD-10-CM | POA: Diagnosis present

## 2021-11-07 DIAGNOSIS — M109 Gout, unspecified: Secondary | ICD-10-CM | POA: Diagnosis present

## 2021-11-07 DIAGNOSIS — Z79899 Other long term (current) drug therapy: Secondary | ICD-10-CM

## 2021-11-07 DIAGNOSIS — E1122 Type 2 diabetes mellitus with diabetic chronic kidney disease: Secondary | ICD-10-CM | POA: Diagnosis present

## 2021-11-07 DIAGNOSIS — Z20822 Contact with and (suspected) exposure to covid-19: Secondary | ICD-10-CM | POA: Diagnosis present

## 2021-11-07 DIAGNOSIS — N179 Acute kidney failure, unspecified: Secondary | ICD-10-CM | POA: Diagnosis not present

## 2021-11-07 DIAGNOSIS — I272 Pulmonary hypertension, unspecified: Secondary | ICD-10-CM | POA: Diagnosis present

## 2021-11-07 DIAGNOSIS — Z515 Encounter for palliative care: Secondary | ICD-10-CM

## 2021-11-07 DIAGNOSIS — J9621 Acute and chronic respiratory failure with hypoxia: Secondary | ICD-10-CM | POA: Diagnosis present

## 2021-11-07 DIAGNOSIS — R001 Bradycardia, unspecified: Secondary | ICD-10-CM | POA: Diagnosis not present

## 2021-11-07 DIAGNOSIS — N1832 Chronic kidney disease, stage 3b: Secondary | ICD-10-CM | POA: Diagnosis present

## 2021-11-07 DIAGNOSIS — G4733 Obstructive sleep apnea (adult) (pediatric): Secondary | ICD-10-CM | POA: Diagnosis present

## 2021-11-07 DIAGNOSIS — I130 Hypertensive heart and chronic kidney disease with heart failure and stage 1 through stage 4 chronic kidney disease, or unspecified chronic kidney disease: Secondary | ICD-10-CM | POA: Diagnosis present

## 2021-11-07 DIAGNOSIS — Z803 Family history of malignant neoplasm of breast: Secondary | ICD-10-CM

## 2021-11-07 DIAGNOSIS — Z7985 Long-term (current) use of injectable non-insulin antidiabetic drugs: Secondary | ICD-10-CM

## 2021-11-07 DIAGNOSIS — G40901 Epilepsy, unspecified, not intractable, with status epilepticus: Secondary | ICD-10-CM | POA: Diagnosis present

## 2021-11-07 DIAGNOSIS — I509 Heart failure, unspecified: Secondary | ICD-10-CM

## 2021-11-07 DIAGNOSIS — Z833 Family history of diabetes mellitus: Secondary | ICD-10-CM

## 2021-11-07 DIAGNOSIS — Z886 Allergy status to analgesic agent status: Secondary | ICD-10-CM

## 2021-11-07 DIAGNOSIS — E785 Hyperlipidemia, unspecified: Secondary | ICD-10-CM | POA: Diagnosis present

## 2021-11-07 DIAGNOSIS — R911 Solitary pulmonary nodule: Secondary | ICD-10-CM | POA: Diagnosis present

## 2021-11-07 DIAGNOSIS — Z96642 Presence of left artificial hip joint: Secondary | ICD-10-CM | POA: Diagnosis present

## 2021-11-07 DIAGNOSIS — I2781 Cor pulmonale (chronic): Secondary | ICD-10-CM | POA: Diagnosis present

## 2021-11-07 DIAGNOSIS — G9340 Encephalopathy, unspecified: Secondary | ICD-10-CM | POA: Diagnosis present

## 2021-11-07 DIAGNOSIS — E1165 Type 2 diabetes mellitus with hyperglycemia: Secondary | ICD-10-CM | POA: Diagnosis present

## 2021-11-07 DIAGNOSIS — Z7901 Long term (current) use of anticoagulants: Secondary | ICD-10-CM

## 2021-11-07 DIAGNOSIS — Z66 Do not resuscitate: Secondary | ICD-10-CM | POA: Diagnosis not present

## 2021-11-07 DIAGNOSIS — I5082 Biventricular heart failure: Secondary | ICD-10-CM | POA: Diagnosis present

## 2021-11-07 DIAGNOSIS — Z7951 Long term (current) use of inhaled steroids: Secondary | ICD-10-CM

## 2021-11-07 DIAGNOSIS — Z87891 Personal history of nicotine dependence: Secondary | ICD-10-CM

## 2021-11-07 DIAGNOSIS — Z9981 Dependence on supplemental oxygen: Secondary | ICD-10-CM

## 2021-11-07 DIAGNOSIS — Z8249 Family history of ischemic heart disease and other diseases of the circulatory system: Secondary | ICD-10-CM

## 2021-11-07 DIAGNOSIS — E039 Hypothyroidism, unspecified: Secondary | ICD-10-CM | POA: Diagnosis present

## 2021-11-07 DIAGNOSIS — Z86711 Personal history of pulmonary embolism: Secondary | ICD-10-CM

## 2021-11-07 DIAGNOSIS — Z853 Personal history of malignant neoplasm of breast: Secondary | ICD-10-CM

## 2021-11-07 DIAGNOSIS — E11649 Type 2 diabetes mellitus with hypoglycemia without coma: Secondary | ICD-10-CM | POA: Diagnosis not present

## 2021-11-07 DIAGNOSIS — R0602 Shortness of breath: Principal | ICD-10-CM

## 2021-11-07 DIAGNOSIS — Z79890 Hormone replacement therapy (postmenopausal): Secondary | ICD-10-CM

## 2021-11-07 DIAGNOSIS — E875 Hyperkalemia: Secondary | ICD-10-CM | POA: Diagnosis not present

## 2021-11-07 DIAGNOSIS — Z841 Family history of disorders of kidney and ureter: Secondary | ICD-10-CM

## 2021-11-07 LAB — CBC
HCT: 40.6 % (ref 36.0–46.0)
Hemoglobin: 12.3 g/dL (ref 12.0–15.0)
MCH: 27.6 pg (ref 26.0–34.0)
MCHC: 30.3 g/dL (ref 30.0–36.0)
MCV: 91.2 fL (ref 80.0–100.0)
Platelets: 251 10*3/uL (ref 150–400)
RBC: 4.45 MIL/uL (ref 3.87–5.11)
RDW: 17.2 % — ABNORMAL HIGH (ref 11.5–15.5)
WBC: 8.4 10*3/uL (ref 4.0–10.5)
nRBC: 0.4 % — ABNORMAL HIGH (ref 0.0–0.2)

## 2021-11-07 LAB — BASIC METABOLIC PANEL
Anion gap: 8 (ref 5–15)
BUN: 31 mg/dL — ABNORMAL HIGH (ref 8–23)
CO2: 31 mmol/L (ref 22–32)
Calcium: 9.7 mg/dL (ref 8.9–10.3)
Chloride: 105 mmol/L (ref 98–111)
Creatinine, Ser: 1.48 mg/dL — ABNORMAL HIGH (ref 0.44–1.00)
GFR, Estimated: 36 mL/min — ABNORMAL LOW (ref >60)
Glucose, Bld: 110 mg/dL — ABNORMAL HIGH (ref 70–99)
Potassium: 4.4 mmol/L (ref 3.5–5.1)
Sodium: 144 mmol/L (ref 135–145)

## 2021-11-07 LAB — TROPONIN I (HIGH SENSITIVITY): Troponin I (High Sensitivity): 54 ng/L — ABNORMAL HIGH (ref <18)

## 2021-11-07 MED ORDER — FUROSEMIDE 10 MG/ML IJ SOLN
60.0000 mg | Freq: Once | INTRAMUSCULAR | Status: AC
  Administered 2021-11-07: 60 mg via INTRAVENOUS
  Filled 2021-11-07: qty 8

## 2021-11-07 NOTE — ED Provider Notes (Signed)
?[facility] DEPT ?Provider Note ? ? ?CSN: [id number] ?Arrival date & time: 10/23/2021  2113 ? ?  ? ?History ? ?Chief Complaint  ?Patient presents with  ? Shortness of Breath  ? ? ?Amanda Short is a 81 y.o. female. ? ?HPI ? ?Patient with medical history including chronic respiratory failure on 4 L via nasal cannula, CHF pulmonary hypertension, presents with complaints of shortness of breath and worsening leg swelling.  Patient states that she has been having difficulty with her shortness breath for last 2 weeks but over the last 2 days it has gotten a lot worse, states that she is more fatigued and becomes more out of breath with exertion, states she will some slight chest pressure but this will since resolved, she states that she is worsening swelling her legs, she has been taking her Lasix which has not been helping her very much, states she been good urine output.  She notes that she came to the emergency room yesterday and was sent home.  She states she is back today because SOB has gotten worsed and feels it is from all the fluid in her lungs.  She denies URI-like symptoms fevers chills cough congestion stomach pains nausea vomiting diarrhea. ? ?Reviewed patient's chart currently managed by Dr. Haroldine Laws on 20 mg of Lasix, she was seen yesterday with an elevated BNP and in the 2000's, without significant edema seen on chest x-ray, she had peripheral edema present, she was increased to 40 mg of Lasix and was discharged home. ? ?Home Medications ?Prior to Admission medications   ?Medication Sig Start Date End Date Taking? Authorizing Provider  ?acetaminophen (TYLENOL) 500 MG tablet Take 500-1,000 mg by mouth every 6 (six) hours as needed for mild pain.     [provider]  ?ALPRAZolam Duanne Moron) 0.5 MG tablet TAKE 1 TABLET (0.5 MG TOTAL) BY MOUTH AT BEDTIME AS NEEDED FOR SLEEP. 09/14/21   Haydee Salter, MD  ?atorvastatin (LIPITOR) 20 MG tablet Take 20 mg by mouth daily.     [provider]  ?benzonatate (TESSALON PERLES) 100 MG capsule Take 1 capsule (100 mg total) by mouth 3 (three) times daily as needed. ?Patient taking differently: Take 100 mg by mouth 3 (three) times daily as needed for cough. 09/09/20   Lucretia Kern, DO  ?calcium-vitamin D (OSCAL WITH D) 500-200 MG-UNIT tablet Take 1 tablet by mouth 2 (two) times daily.    [provider]  ?diltiazem (CARDIZEM) 30 MG tablet TAKE 1 TABLET BY MOUTH TWICE A DAY 04/18/21   Haydee Salter, MD  ?DULoxetine (CYMBALTA) 60 MG capsule TAKE 1 CAPSULE BY MOUTH EVERY DAY 10/10/21   Haydee Salter, MD  ?febuxostat (ULORIC) 40 MG tablet TAKE 1 TABLET BY MOUTH EVERY DAY 04/18/21   Haydee Salter, MD  ?furosemide (LASIX) 40 MG tablet Take 40 mg by mouth daily.    [provider]  ?levETIRAcetam (KEPPRA) 500 MG tablet Take 500 mg by mouth 2 (two) times daily.    [provider]  ?levothyroxine (SYNTHROID) 100 MCG tablet TAKE 1 TABLET BY MOUTH DAILY BEFORE BREAKFAST. 10/10/21   Haydee Salter, MD  ?loratadine (CLARITIN) 10 MG tablet Take 10 mg by mouth daily as needed for allergies.    [provider]  ?metoprolol succinate (TOPROL-XL) 50 MG 24 hr tablet Take 1 tablet (50 mg total) by mouth daily. 07/19/21   Park Liter, MD  ?montelukast (SINGULAIR) 10 MG tablet Take 10 mg at bedtime  by mouth.    [provider]  ?Multiple Vitamin (MULTIVITAMIN WITH MINERALS) TABS tablet Take 1 tablet by mouth daily. Unknown strengfth    [provider]  ?nateglinide (STARLIX) 120 MG tablet TAKE 1 TABLET BY MOUTH EVERY DAY 07/16/21   Haydee Salter, MD  ?omeprazole (PRILOSEC) 40 MG capsule Take 40 mg by mouth 2 (two) times daily.    [provider]  ?OXYGEN Inhale 4 L/min into the lungs as directed. 24 hours a day    [provider]  ?spironolactone (ALDACTONE) 25 MG tablet Take 12.5 mg by mouth daily.    [provider]  ?STIOLTO RESPIMAT 2.5-2.5 MCG/ACT AERS INHALE 2  PUFFS BY MOUTH INTO THE LUNGS DAILY 02/15/21   Collene Gobble, MD  ?Treprostinil (TYVASO) 0.6 MG/ML SOLN Inhale 18 mcg into the lungs in the morning, at noon, in the evening, and at bedtime. 10/21/20   Bensimhon, Shaune Pascal, MD  ?TRESIBA FLEXTOUCH 100 UNIT/ML FlexTouch Pen INJECT 28 UNITS DAILY OR AS DIRECTED 10/17/21   Haydee Salter, MD  ?Alveda Reasons 15 MG TABS tablet TAKE 1 TABLET BY MOUTH EVERY DAY ?Patient taking differently: daily with supper. 02/14/21   Haydee Salter, MD  ?   ? ?Allergies    ?Allopurinol and Aspirin   ? ?Review of Systems   ?Review of Systems  ?Constitutional:  Negative for chills and fever.  ?Respiratory:  Positive for shortness of breath.   ?Cardiovascular:  Positive for leg swelling. Negative for chest pain and palpitations.  ?Gastrointestinal:  Negative for abdominal pain.  ?Neurological:  Negative for headaches.  ? ?Physical Exam ?Updated Vital Signs ?BP (!) 131/92   Pulse 98   Temp 98.6 ?F (37 ?C) (Oral)   Resp 16   Ht 5' (1.524 m)   Wt 65.8 kg   SpO2 99%   BMI 28.32 kg/m?  ?Physical Exam ?Vitals and nursing note reviewed.  ?Constitutional:   ?   General: She is not in acute distress. ?   Appearance: She is not ill-appearing.  ?HENT:  ?   Head: Normocephalic and atraumatic.  ?   Nose: No congestion.  ?Eyes:  ?   Conjunctiva/sclera: Conjunctivae normal.  ?Cardiovascular:  ?   Rate and Rhythm: Normal rate and regular rhythm.  ?   Pulses: Normal pulses.  ?   Heart sounds: No murmur heard. ?  No friction rub. No gallop.  ?Pulmonary:  ?   Effort: No respiratory distress.  ?   Breath sounds: No wheezing, rhonchi or rales.  ?   Comments: Currently on 6 L via nasal cannula without signs of acute respiratory distress nontachypneic nonhypoxic speaking full sentences, patient had slightly diminished lung sounds lower lobes bilaterally no rales rhonchi or stridor present. ?Abdominal:  ?   Palpations: Abdomen is soft.  ?   Tenderness: There is no abdominal tenderness. There is no right CVA tenderness  or left CVA tenderness.  ?Musculoskeletal:  ?   Right lower leg: Edema present.  ?   Left lower leg: Edema present.  ?   Comments: 2+ pitting edema up to the shins bilaterally, without overlying skin changes, 2+ dorsal pedal pulses.  ?Skin: ?   General: Skin is warm and dry.  ?Neurological:  ?   Mental Status: She is alert.  ?Psychiatric:     ?   Mood and Affect: Mood normal.  ? ? ?ED Results / Procedures / Treatments   ?Labs ?(all labs ordered are listed, but only abnormal  results are displayed) ?Labs Reviewed  ?CBC - Abnormal; Notable for the following components:  ?    Result Value  ? RDW 17.2 (*)   ? nRBC 0.4 (*)   ? All other components within normal limits  ?BASIC METABOLIC PANEL - Abnormal; Notable for the following components:  ? Glucose, Bld 110 (*)   ? BUN 31 (*)   ? Creatinine, Ser 1.48 (*)   ? GFR, Estimated 36 (*)   ? All other components within normal limits  ?BRAIN NATRIURETIC PEPTIDE - Abnormal; Notable for the following components:  ? B Natriuretic Peptide 3,386.0 (*)   ? All other components within normal limits  ?TROPONIN I (HIGH SENSITIVITY) - Abnormal; Notable for the following components:  ? Troponin I (High Sensitivity) 54 (*)   ? All other components within normal limits  ?TROPONIN I (HIGH SENSITIVITY) - Abnormal; Notable for the following components:  ? Troponin I (High Sensitivity) 59 (*)   ? All other components within normal limits  ?COMPREHENSIVE METABOLIC PANEL  ?MAGNESIUM  ?CBC WITH DIFFERENTIAL/PLATELET  ?HEMOGLOBIN A1C  ? ? ?EKG ?None ? ?Radiology ?DG Chest 2 View ? ?Result Date: 11/06/2021 ?CLINICAL DATA:  Dyspnea. EXAM: CHEST - 2 VIEW COMPARISON:  February 27, 2020 FINDINGS: Tortuosity of the aorta. Cardiomediastinal silhouette is mildly enlarged. Mediastinal contours appear intact. There is no evidence of focal airspace consolidation, pleural effusion or pneumothorax. Osseous structures are without acute abnormality. Soft tissues are grossly normal. IMPRESSION: 1. Mildly enlarged  cardiac silhouette. 2. No evidence of focal airspace consolidation. Electronically Signed   By: Fidela Salisbury M.D.   On: 11/06/2021 19:14  ? ?DG Chest Portable 1 View ? ?Result Date: 10/20/2021 ?CLINICAL DATA:

## 2021-11-07 NOTE — Telephone Encounter (Signed)
Pt asked that I forward emergency dept note to [place] [place] and see if she needs to make any changes. Pt also asked if she needs sooner office visit than 5/15.  Routed to [place] for advice.  ?

## 2021-11-07 NOTE — ED Triage Notes (Signed)
Pt to ED via [facility] from home.  Pt c/o shortness of breath and vomiting today.  Pt states she was seen for same yesterday.  Pt wears 4L [place] at home, EMS reports pt 02 sat was in the 80s with this. NRB placed by EMS, 02sat=100% on NRB.  Pt A&Ox4, sitting up, tachypnic on arrival.  ? ?EMS VS: ?160/80 ?HR=104 ?100% NRB ? ?

## 2021-11-08 DIAGNOSIS — J9611 Chronic respiratory failure with hypoxia: Secondary | ICD-10-CM | POA: Diagnosis not present

## 2021-11-08 DIAGNOSIS — I130 Hypertensive heart and chronic kidney disease with heart failure and stage 1 through stage 4 chronic kidney disease, or unspecified chronic kidney disease: Secondary | ICD-10-CM | POA: Diagnosis present

## 2021-11-08 DIAGNOSIS — J9621 Acute and chronic respiratory failure with hypoxia: Secondary | ICD-10-CM | POA: Diagnosis present

## 2021-11-08 DIAGNOSIS — R0603 Acute respiratory distress: Secondary | ICD-10-CM | POA: Diagnosis not present

## 2021-11-08 DIAGNOSIS — Z853 Personal history of malignant neoplasm of breast: Secondary | ICD-10-CM | POA: Diagnosis not present

## 2021-11-08 DIAGNOSIS — G40901 Epilepsy, unspecified, not intractable, with status epilepticus: Secondary | ICD-10-CM | POA: Diagnosis present

## 2021-11-08 DIAGNOSIS — Z20822 Contact with and (suspected) exposure to covid-19: Secondary | ICD-10-CM | POA: Diagnosis present

## 2021-11-08 DIAGNOSIS — I50811 Acute right heart failure: Secondary | ICD-10-CM

## 2021-11-08 DIAGNOSIS — N179 Acute kidney failure, unspecified: Secondary | ICD-10-CM | POA: Diagnosis not present

## 2021-11-08 DIAGNOSIS — I509 Heart failure, unspecified: Secondary | ICD-10-CM | POA: Diagnosis present

## 2021-11-08 DIAGNOSIS — I5082 Biventricular heart failure: Secondary | ICD-10-CM | POA: Diagnosis present

## 2021-11-08 DIAGNOSIS — E1122 Type 2 diabetes mellitus with diabetic chronic kidney disease: Secondary | ICD-10-CM | POA: Diagnosis present

## 2021-11-08 DIAGNOSIS — G9340 Encephalopathy, unspecified: Secondary | ICD-10-CM | POA: Diagnosis present

## 2021-11-08 DIAGNOSIS — Z7189 Other specified counseling: Secondary | ICD-10-CM | POA: Diagnosis not present

## 2021-11-08 DIAGNOSIS — R578 Other shock: Secondary | ICD-10-CM | POA: Diagnosis not present

## 2021-11-08 DIAGNOSIS — R4182 Altered mental status, unspecified: Secondary | ICD-10-CM | POA: Diagnosis not present

## 2021-11-08 DIAGNOSIS — M109 Gout, unspecified: Secondary | ICD-10-CM | POA: Diagnosis present

## 2021-11-08 DIAGNOSIS — J441 Chronic obstructive pulmonary disease with (acute) exacerbation: Secondary | ICD-10-CM | POA: Diagnosis present

## 2021-11-08 DIAGNOSIS — G9341 Metabolic encephalopathy: Secondary | ICD-10-CM | POA: Diagnosis not present

## 2021-11-08 DIAGNOSIS — K219 Gastro-esophageal reflux disease without esophagitis: Secondary | ICD-10-CM | POA: Diagnosis present

## 2021-11-08 DIAGNOSIS — E11649 Type 2 diabetes mellitus with hypoglycemia without coma: Secondary | ICD-10-CM | POA: Diagnosis not present

## 2021-11-08 DIAGNOSIS — E039 Hypothyroidism, unspecified: Secondary | ICD-10-CM | POA: Diagnosis present

## 2021-11-08 DIAGNOSIS — Z86718 Personal history of other venous thrombosis and embolism: Secondary | ICD-10-CM | POA: Diagnosis not present

## 2021-11-08 DIAGNOSIS — Z9981 Dependence on supplemental oxygen: Secondary | ICD-10-CM | POA: Diagnosis not present

## 2021-11-08 DIAGNOSIS — I469 Cardiac arrest, cause unspecified: Secondary | ICD-10-CM | POA: Diagnosis not present

## 2021-11-08 DIAGNOSIS — Z515 Encounter for palliative care: Secondary | ICD-10-CM | POA: Diagnosis not present

## 2021-11-08 DIAGNOSIS — I2729 Other secondary pulmonary hypertension: Secondary | ICD-10-CM | POA: Diagnosis present

## 2021-11-08 DIAGNOSIS — N1832 Chronic kidney disease, stage 3b: Secondary | ICD-10-CM | POA: Diagnosis present

## 2021-11-08 DIAGNOSIS — I2781 Cor pulmonale (chronic): Secondary | ICD-10-CM | POA: Diagnosis present

## 2021-11-08 DIAGNOSIS — Z66 Do not resuscitate: Secondary | ICD-10-CM | POA: Diagnosis not present

## 2021-11-08 DIAGNOSIS — Z86711 Personal history of pulmonary embolism: Secondary | ICD-10-CM | POA: Diagnosis not present

## 2021-11-08 DIAGNOSIS — R001 Bradycardia, unspecified: Secondary | ICD-10-CM | POA: Diagnosis not present

## 2021-11-08 DIAGNOSIS — I5033 Acute on chronic diastolic (congestive) heart failure: Secondary | ICD-10-CM | POA: Diagnosis present

## 2021-11-08 LAB — CBC WITH DIFFERENTIAL/PLATELET
Abs Immature Granulocytes: 0.03 10*3/uL (ref 0.00–0.07)
Basophils Absolute: 0.1 10*3/uL (ref 0.0–0.1)
Basophils Relative: 1 %
Eosinophils Absolute: 0.3 10*3/uL (ref 0.0–0.5)
Eosinophils Relative: 3 %
HCT: 38 % (ref 36.0–46.0)
Hemoglobin: 11.8 g/dL — ABNORMAL LOW (ref 12.0–15.0)
Immature Granulocytes: 0 %
Lymphocytes Relative: 20 %
Lymphs Abs: 1.9 10*3/uL (ref 0.7–4.0)
MCH: 27.8 pg (ref 26.0–34.0)
MCHC: 31.1 g/dL (ref 30.0–36.0)
MCV: 89.6 fL (ref 80.0–100.0)
Monocytes Absolute: 1.6 10*3/uL — ABNORMAL HIGH (ref 0.1–1.0)
Monocytes Relative: 17 %
Neutro Abs: 5.6 10*3/uL (ref 1.7–7.7)
Neutrophils Relative %: 59 %
Platelets: 243 10*3/uL (ref 150–400)
RBC: 4.24 MIL/uL (ref 3.87–5.11)
RDW: 17.2 % — ABNORMAL HIGH (ref 11.5–15.5)
WBC: 9.4 10*3/uL (ref 4.0–10.5)
nRBC: 0.2 % (ref 0.0–0.2)

## 2021-11-08 LAB — BLOOD GAS, VENOUS
Acid-Base Excess: 10.7 mmol/L — ABNORMAL HIGH (ref 0.0–2.0)
Bicarbonate: 37.2 mmol/L — ABNORMAL HIGH (ref 20.0–28.0)
O2 Saturation: 33.3 %
Patient temperature: 36.9
pCO2, Ven: 56 mmHg (ref 44–60)
pH, Ven: 7.43 (ref 7.25–7.43)
pO2, Ven: <31 mmHg — CL (ref 32–45)

## 2021-11-08 LAB — COMPREHENSIVE METABOLIC PANEL
ALT: 27 U/L (ref 0–44)
AST: 50 U/L — ABNORMAL HIGH (ref 15–41)
Albumin: 3.4 g/dL — ABNORMAL LOW (ref 3.5–5.0)
Alkaline Phosphatase: 70 U/L (ref 38–126)
Anion gap: 10 (ref 5–15)
BUN: 29 mg/dL — ABNORMAL HIGH (ref 8–23)
CO2: 31 mmol/L (ref 22–32)
Calcium: 9.6 mg/dL (ref 8.9–10.3)
Chloride: 103 mmol/L (ref 98–111)
Creatinine, Ser: 1.35 mg/dL — ABNORMAL HIGH (ref 0.44–1.00)
GFR, Estimated: 40 mL/min — ABNORMAL LOW (ref >60)
Glucose, Bld: 46 mg/dL — ABNORMAL LOW (ref 70–99)
Potassium: 3.7 mmol/L (ref 3.5–5.1)
Sodium: 144 mmol/L (ref 135–145)
Total Bilirubin: 0.9 mg/dL (ref 0.3–1.2)
Total Protein: 6.1 g/dL — ABNORMAL LOW (ref 6.5–8.1)

## 2021-11-08 LAB — HEMOGLOBIN A1C
Hgb A1c MFr Bld: 7.7 % — ABNORMAL HIGH (ref 4.8–5.6)
Mean Plasma Glucose: 174.29 mg/dL

## 2021-11-08 LAB — CBG MONITORING, ED
Glucose-Capillary: 171 mg/dL — ABNORMAL HIGH (ref 70–99)
Glucose-Capillary: 36 mg/dL — CL (ref 70–99)
Glucose-Capillary: 309 mg/dL — ABNORMAL HIGH (ref 70–99)
Glucose-Capillary: 308 mg/dL — ABNORMAL HIGH (ref 70–99)

## 2021-11-08 LAB — TROPONIN I (HIGH SENSITIVITY): Troponin I (High Sensitivity): 59 ng/L — ABNORMAL HIGH (ref <18)

## 2021-11-08 LAB — TSH: TSH: 0.295 u[IU]/mL — ABNORMAL LOW (ref 0.350–4.500)

## 2021-11-08 LAB — BRAIN NATRIURETIC PEPTIDE: B Natriuretic Peptide: 3386 pg/mL — ABNORMAL HIGH (ref 0.0–100.0)

## 2021-11-08 LAB — GLUCOSE, CAPILLARY
Glucose-Capillary: 200 mg/dL — ABNORMAL HIGH (ref 70–99)
Glucose-Capillary: 281 mg/dL — ABNORMAL HIGH (ref 70–99)

## 2021-11-08 LAB — MAGNESIUM: Magnesium: 1.6 mg/dL — ABNORMAL LOW (ref 1.7–2.4)

## 2021-11-08 MED ORDER — INSULIN ASPART 100 UNIT/ML IJ SOLN
0.0000 [IU] | Freq: Three times a day (TID) | INTRAMUSCULAR | Status: DC
  Administered 2021-11-08: 3 [IU] via SUBCUTANEOUS
  Administered 2021-11-08: 11 [IU] via SUBCUTANEOUS
  Administered 2021-11-09: 3 [IU] via SUBCUTANEOUS
  Filled 2021-11-08: qty 0.15

## 2021-11-08 MED ORDER — BUDESONIDE 0.25 MG/2ML IN SUSP
0.2500 mg | Freq: Two times a day (BID) | RESPIRATORY_TRACT | Status: DC
  Administered 2021-11-08 – 2021-11-11 (×7): 0.25 mg via RESPIRATORY_TRACT
  Filled 2021-11-08 (×7): qty 2

## 2021-11-08 MED ORDER — POTASSIUM CHLORIDE CRYS ER 20 MEQ PO TBCR
40.0000 meq | EXTENDED_RELEASE_TABLET | Freq: Once | ORAL | Status: AC
Start: 2021-11-08 — End: 2021-11-08
  Administered 2021-11-08: 40 meq via ORAL
  Filled 2021-11-08: qty 2

## 2021-11-08 MED ORDER — DILTIAZEM HCL 30 MG PO TABS
30.0000 mg | ORAL_TABLET | Freq: Two times a day (BID) | ORAL | Status: DC
  Administered 2021-11-08 – 2021-11-09 (×2): 30 mg via ORAL
  Filled 2021-11-08 (×2): qty 1

## 2021-11-08 MED ORDER — SODIUM CHLORIDE 0.9 % IV SOLN: 250.0000 mL | INTRAVENOUS | Status: DC | PRN

## 2021-11-08 MED ORDER — PREDNISONE 20 MG PO TABS
20.0000 mg | ORAL_TABLET | Freq: Every day | ORAL | Status: DC
  Administered 2021-11-08 – 2021-11-09 (×2): 20 mg via ORAL
  Filled 2021-11-08 (×2): qty 1

## 2021-11-08 MED ORDER — LEVETIRACETAM 500 MG PO TABS
500.0000 mg | ORAL_TABLET | Freq: Two times a day (BID) | ORAL | Status: DC
  Administered 2021-11-08 – 2021-11-09 (×2): 500 mg via ORAL
  Filled 2021-11-08 (×2): qty 1

## 2021-11-08 MED ORDER — DEXTROSE 50 % IV SOLN: 25.0000 g | Freq: Once | INTRAVENOUS | Status: DC

## 2021-11-08 MED ORDER — INSULIN ASPART 100 UNIT/ML IJ SOLN
0.0000 [IU] | Freq: Three times a day (TID) | INTRAMUSCULAR | Status: DC
  Filled 2021-11-08: qty 0.15

## 2021-11-08 MED ORDER — DEXTROSE 50 % IV SOLN
INTRAVENOUS | Status: AC
  Administered 2021-11-08: 25 g via INTRAVENOUS
  Filled 2021-11-08: qty 50

## 2021-11-08 MED ORDER — FEBUXOSTAT 40 MG PO TABS
40.0000 mg | ORAL_TABLET | Freq: Every day | ORAL | Status: DC
Start: 2021-11-08 — End: 2021-11-09
  Administered 2021-11-09: 40 mg via ORAL
  Filled 2021-11-08: qty 1

## 2021-11-08 MED ORDER — ACETAMINOPHEN 325 MG PO TABS
650.0000 mg | ORAL_TABLET | Freq: Four times a day (QID) | ORAL | Status: DC | PRN
  Filled 2021-11-08: qty 2

## 2021-11-08 MED ORDER — ACETAMINOPHEN 500 MG PO TABS: 500.0000 mg | ORAL_TABLET | Freq: Four times a day (QID) | ORAL | Status: DC | PRN

## 2021-11-08 MED ORDER — MONTELUKAST SODIUM 10 MG PO TABS
10.0000 mg | ORAL_TABLET | Freq: Every day | ORAL | Status: DC
  Administered 2021-11-08: 10 mg via ORAL
  Filled 2021-11-08: qty 1

## 2021-11-08 MED ORDER — SODIUM CHLORIDE 0.9% FLUSH
3.0000 mL | Freq: Two times a day (BID) | INTRAVENOUS | Status: DC
  Administered 2021-11-08 – 2021-11-10 (×6): 3 mL via INTRAVENOUS

## 2021-11-08 MED ORDER — LEVOTHYROXINE SODIUM 100 MCG PO TABS
100.0000 ug | ORAL_TABLET | Freq: Every day | ORAL | Status: DC
  Administered 2021-11-09: 100 ug via ORAL
  Filled 2021-11-08: qty 1

## 2021-11-08 MED ORDER — BENZONATATE 100 MG PO CAPS: 100.0000 mg | ORAL_CAPSULE | Freq: Three times a day (TID) | ORAL | Status: DC | PRN

## 2021-11-08 MED ORDER — ATORVASTATIN CALCIUM 10 MG PO TABS
20.0000 mg | ORAL_TABLET | Freq: Every day | ORAL | Status: DC
  Administered 2021-11-09: 20 mg via ORAL
  Filled 2021-11-08: qty 2

## 2021-11-08 MED ORDER — TREPROSTINIL 0.6 MG/ML IN SOLN: 18.0000 ug | Freq: Four times a day (QID) | RESPIRATORY_TRACT | Status: DC

## 2021-11-08 MED ORDER — PANTOPRAZOLE SODIUM 40 MG PO TBEC
40.0000 mg | DELAYED_RELEASE_TABLET | Freq: Every day | ORAL | Status: DC
Start: 2021-11-08 — End: 2021-11-09
  Administered 2021-11-09: 40 mg via ORAL
  Filled 2021-11-08: qty 1

## 2021-11-08 MED ORDER — DEXTROSE 50 % IV SOLN: 25.0000 g | INTRAVENOUS | Status: DC | PRN

## 2021-11-08 MED ORDER — SODIUM CHLORIDE 0.9% FLUSH: 3.0000 mL | INTRAVENOUS | Status: DC | PRN

## 2021-11-08 MED ORDER — RIVAROXABAN 15 MG PO TABS
15.0000 mg | ORAL_TABLET | Freq: Every day | ORAL | Status: DC
  Administered 2021-11-08: 15 mg via ORAL
  Filled 2021-11-08 (×2): qty 1

## 2021-11-08 MED ORDER — FUROSEMIDE 40 MG PO TABS: 40.0000 mg | ORAL_TABLET | Freq: Every day | ORAL | Status: DC

## 2021-11-08 MED ORDER — SPIRONOLACTONE 12.5 MG HALF TABLET
12.5000 mg | ORAL_TABLET | Freq: Every day | ORAL | Status: DC
  Administered 2021-11-09: 12.5 mg via ORAL
  Filled 2021-11-08: qty 1

## 2021-11-08 MED ORDER — METOPROLOL TARTRATE 25 MG PO TABS
50.0000 mg | ORAL_TABLET | Freq: Two times a day (BID) | ORAL | Status: DC
  Administered 2021-11-08 – 2021-11-09 (×2): 50 mg via ORAL
  Filled 2021-11-08 (×2): qty 2

## 2021-11-08 MED ORDER — ALPRAZOLAM 0.5 MG PO TABS
0.5000 mg | ORAL_TABLET | Freq: Every evening | ORAL | Status: DC | PRN
  Administered 2021-11-08: 0.5 mg via ORAL
  Filled 2021-11-08: qty 1

## 2021-11-08 MED ORDER — DULOXETINE HCL 30 MG PO CPEP
60.0000 mg | ORAL_CAPSULE | Freq: Every day | ORAL | Status: DC
  Administered 2021-11-09: 60 mg via ORAL
  Filled 2021-11-08: qty 2

## 2021-11-08 MED ORDER — POLYETHYLENE GLYCOL 3350 17 G PO PACK: 17.0000 g | PACK | Freq: Every day | ORAL | Status: DC | PRN

## 2021-11-08 MED ORDER — IPRATROPIUM BROMIDE 0.02 % IN SOLN
0.5000 mg | Freq: Three times a day (TID) | RESPIRATORY_TRACT | Status: DC
  Administered 2021-11-09 – 2021-11-10 (×4): 0.5 mg via RESPIRATORY_TRACT
  Filled 2021-11-08 (×4): qty 2.5

## 2021-11-08 MED ORDER — LORATADINE 10 MG PO TABS: 10.0000 mg | ORAL_TABLET | Freq: Every day | ORAL | Status: DC | PRN

## 2021-11-08 MED ORDER — INSULIN DEGLUDEC 100 UNIT/ML ~~LOC~~ SOPN
14.0000 [IU] | PEN_INJECTOR | Freq: Every day | SUBCUTANEOUS | Status: DC
Start: 2021-11-08 — End: 2021-11-08

## 2021-11-08 MED ORDER — ACETAMINOPHEN 325 MG PO TABS: 650.0000 mg | ORAL_TABLET | ORAL | Status: DC | PRN

## 2021-11-08 MED ORDER — GUAIFENESIN ER 600 MG PO TB12
600.0000 mg | ORAL_TABLET | Freq: Two times a day (BID) | ORAL | Status: DC
  Administered 2021-11-08 – 2021-11-09 (×2): 600 mg via ORAL
  Filled 2021-11-08 (×2): qty 1

## 2021-11-08 MED ORDER — FUROSEMIDE 10 MG/ML IJ SOLN
40.0000 mg | Freq: Two times a day (BID) | INTRAMUSCULAR | Status: DC
  Administered 2021-11-08 (×2): 40 mg via INTRAVENOUS
  Filled 2021-11-08 (×2): qty 4

## 2021-11-08 MED ORDER — ACETAMINOPHEN 650 MG RE SUPP: 650.0000 mg | Freq: Four times a day (QID) | RECTAL | Status: DC | PRN

## 2021-11-08 MED ORDER — IPRATROPIUM BROMIDE 0.02 % IN SOLN
0.5000 mg | RESPIRATORY_TRACT | Status: DC
  Administered 2021-11-08 (×2): 0.5 mg via RESPIRATORY_TRACT
  Filled 2021-11-08 (×2): qty 2.5

## 2021-11-08 MED ORDER — SODIUM CHLORIDE 0.9% FLUSH
3.0000 mL | Freq: Two times a day (BID) | INTRAVENOUS | Status: DC
  Administered 2021-11-08 – 2021-11-10 (×4): 3 mL via INTRAVENOUS

## 2021-11-08 MED ORDER — IPRATROPIUM BROMIDE HFA 17 MCG/ACT IN AERS: 2.0000 | INHALATION_SPRAY | RESPIRATORY_TRACT | Status: DC

## 2021-11-08 NOTE — Plan of Care (Signed)
?  Problem: Education: ?Goal: Ability to demonstrate management of disease process will improve ?Outcome: Progressing ?  ?Problem: Education: ?Goal: Knowledge of General Education information will improve ?Description: Including pain rating scale, medication(s)/side effects and non-pharmacologic comfort measures ?Outcome: Progressing ?  ?Problem: Pain Managment: ?Goal: General experience of comfort will improve ?Outcome: Progressing ?  ?Problem: Safety: ?Goal: Ability to remain free from injury will improve ?Outcome: Progressing ?  ?

## 2021-11-08 NOTE — H&P (Signed)
?History and Physical  ? ? ?Amanda Short GNF:[id number] DOB: 30-Dec-1940 DOA: 11/05/2021 ? ?PCP: Haydee Salter, MD (Confirm with patient/family/NH records and if not entered, this has to be entered at [facility] point of entry) ?Patient coming from: Home ? ?I have personally briefly reviewed patient's old medical records in [place] ? ?Chief Complaint: Wheezing, cough, shortness of breath ? ?HPI: Amanda Short is a 81 y.o. 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 ? ?Symptoms started 3 to 4 days ago, with URI-like symptoms including sneezing, dry cough and wheezing and increasing shortness of breath.  She turned up home O2 to 5 L, still having exertional dyspnea.  Denies any fever chills, no chest pains.  Denies any swelling.  She takes chronic Lasix 40 mg BID. ? ?Patient admitted that she only takes Tyvaso on PRN bases, she has no maintenance or rescue breathing medications. ? ?ED Course: Tachypneic and tachycardia, O2 saturation 89% on 4 L, initially increased to 15 L nonrebreather 10 subsequently stabilized on 6 L ? ?Chest x-ray negative for acute infiltrates.  WBC 9.4, potassium 3.7. ? ?Patient did receive 60 mg IV Lasix, and put out 700 ml of urine and breathing symptoms improved. ? ?Review of Systems: As per HPI otherwise 14 point review of systems negative.  ? ? ?Past Medical History:  ?Diagnosis Date  ? Abdominal pain   ? Abnormal CT of the abdomen   ? Acute blood loss anemia   ? Acute cystitis with hematuria 12/19/2019  ? Acute encephalopathy 06/06/2017  ? Acute pulmonary embolism ([place]) 04/01/2013  ? Acute respiratory failure with hypoxia ([place])   ? Anxiety state   ? Arthritis   ? "back, hands" (07/22/2015)  ? Aspiration pneumonia ([place])   ?  Benign essential HTN   ? Cancer of left breast [facility]) 2006  ? S/P lumpectomy  ? Cellulitis of foot, right 07/21/2015  ? Cellulitis of right foot 07/21/2015  ? Central retinal vein occlusion of right eye 04/20/2016  ? Chronic diastolic CHF (congestive heart failure) ([place]) 07/10/2017  ? Chronic respiratory failure with hypoxia ([facility])   ? Colitis 05/27/2017  ? Complication of anesthesia   ? "brief breathing problem at surgery center in ~ 2005 when I had gallbladder OR"  ? Concussion 03/2013  ? due to fall  ? Depression   ? Diabetes ([place]) 04/01/2013  ? Diabetes mellitus type 2 in nonobese [facility])   ? Diabetes mellitus type 2, controlled ([place]) 07/21/2015  ? Diabetic foot infection ([place]) 07/21/2015  ? Diverticulitis of colon   ? Diverticulitis of large intestine without perforation or abscess without bleeding   ? Diverticulosis   ? DVT (deep venous thrombosis) ([place]) 03/2013  ? LLE  ? Encephalopathy   ? Encounter for attention to tracheostomy [facility])   ? Encounter for central line placement   ? Encounter for orogastric (OG) tube placement   ? Essential hypertension 04/01/2013  ? Essential hypertension, benign 04/01/2013  ? GERD (gastroesophageal reflux disease)   ? History of acute respiratory failure   ? History of breast cancer 2006  ? S/P lumpectomy  ? History of pulmonary embolism   ? HX: breast cancer 04/01/2013  ? Hyperlipidemia   ? Hypertension   ? Hypocalcemia 05/27/2017  ? Hypokalemia 07/10/2017  ?  Hypomagnesemia 07/11/2017  ? Hypothyroidism   ? Hypoxia 03/23/2020  ? Nausea vomiting and diarrhea 07/10/2017  ? Non-intractable cyclical vomiting with nausea   ? Nuclear sclerotic cataract of both eyes 04/20/2016  ? OSA (obstructive sleep apnea) 07/10/2017  ? OSA on CPAP 04/01/2013  ? OSA treated with BiPAP   ? Physical debility 06/23/2017  ? Prolonged QT interval 05/27/2017  ? Pulmonary embolism ([place]) 03/2013  ? Pulmonary HTN ([place]) 07/05/2017  ? Pulmonary hypertension ([place]) 07/05/2017  ? Pulmonary nodule 07/26/2020  ? Seizure ([place]) 07/10/2017   ? Seizure disorder ([place]) 07/05/2017  ? Seizures ([place])   ? Sepsis ([place]) 07/10/2017  ? Sleep apnea   ? Status epilepticus ([place])   ? 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 Tachypnea   ? Thyroid disease   ? Hypothyroid  ? Tracheostomy, acute management ([place])   ? Type II diabetes mellitus ([place])   ? ? ?Past Surgical History:  ?Procedure Laterality Date  ? ABDOMINAL HYSTERECTOMY  1970s  ? BREAST BIOPSY Left 2006  ? BREAST LUMPECTOMY Left 2006  ? BUNIONECTOMY WITH HAMMERTOE RECONSTRUCTION Left   ? CARDIAC CATHETERIZATION  06/2020  ? ESOPHAGOGASTRODUODENOSCOPY N/A 05/29/2017  ? Procedure: ESOPHAGOGASTRODUODENOSCOPY (EGD);  Surgeon: Irene Shipper, MD;  Location: [facility];  Service: Endoscopy;  Laterality: N/A;  ? JOINT REPLACEMENT    ? LAPAROSCOPIC CHOLECYSTECTOMY  ~ 2005  ? Pulmonary function test  2-3 weeks ago  ? RIGHT/LEFT HEART CATH AND CORONARY ANGIOGRAPHY N/A 05/24/2020  ? Procedure: RIGHT/LEFT HEART CATH AND CORONARY ANGIOGRAPHY;  Surgeon: Jolaine Artist, MD;  Location: [facility];  Service: Cardiovascular;  Laterality: N/A;  ? TOTAL HIP ARTHROPLASTY Left 2010  ? TOTAL KNEE ARTHROPLASTY Bilateral 2005-2006  ? ? ? reports that she quit smoking about 28 years ago. Her smoking use included cigarettes. She has a 30.00 pack-year smoking history. She has never used smokeless tobacco. She reports that she does not drink alcohol and does not use drugs. ? ?Allergies  ?Allergen Reactions  ? Allopurinol Nausea And Vomiting  ? Aspirin   ?  Causes jitters  ? ? ?Family History  ?Problem Relation Age of Onset  ? Diabetes Other   ? Breast cancer Sister   ? Liver cancer Mother   ? Uterine cancer Mother   ? Hypertension Mother   ? Colon cancer Maternal Grandmother   ? Breast cancer Cousin   ?     couple  ? Kidney disease Sister   ? Diabetes Sister   ? ? ? ?Prior to Admission medications   ?Medication Sig Start Date End Date Taking? Authorizing Provider  ?acetaminophen (TYLENOL) 500 MG tablet Take 500-1,000 mg by mouth  every 6 (six) hours as needed for mild pain.     [provider]  ?ALPRAZolam Duanne Moron) 0.5 MG tablet TAKE 1 TABLET (0.5 MG TOTAL) BY MOUTH AT BEDTIME AS NEEDED FOR SLEEP. 09/14/21   Haydee Salter, MD  ?atorvastatin (LIPITOR) 20 MG tablet Take 20 mg by mouth daily.    [provider]  ?benzonatate (TESSALON PERLES) 100 MG capsule Take 1 capsule (100 mg total) by mouth 3 (three) times daily as needed. ?Patient taking differently: Take 100 mg by mouth 3 (three) times daily as needed for cough. 09/09/20   Lucretia Kern, DO  ?calcium-vitamin D (OSCAL WITH D) 500-200 MG-UNIT tablet Take 1 tablet by mouth 2 (two) times daily.    [provider]  ?diltiazem (CARDIZEM) 30 MG tablet TAKE 1 TABLET BY MOUTH  TWICE A DAY 04/18/21   Haydee Salter, MD  ?DULoxetine (CYMBALTA) 60 MG capsule TAKE 1 CAPSULE BY MOUTH EVERY DAY 10/10/21   Haydee Salter, MD  ?febuxostat (ULORIC) 40 MG tablet TAKE 1 TABLET BY MOUTH EVERY DAY 04/18/21   Haydee Salter, MD  ?furosemide (LASIX) 40 MG tablet Take 40 mg by mouth daily.    [provider]  ?levETIRAcetam (KEPPRA) 500 MG tablet Take 500 mg by mouth 2 (two) times daily.    [provider]  ?levothyroxine (SYNTHROID) 100 MCG tablet TAKE 1 TABLET BY MOUTH DAILY BEFORE BREAKFAST. 10/10/21   Haydee Salter, MD  ?loratadine (CLARITIN) 10 MG tablet Take 10 mg by mouth daily as needed for allergies.    [provider]  ?metoprolol succinate (TOPROL-XL) 50 MG 24 hr tablet Take 1 tablet (50 mg total) by mouth daily. 07/19/21   Park Liter, MD  ?montelukast (SINGULAIR) 10 MG tablet Take 10 mg at bedtime by mouth.    [provider]  ?Multiple Vitamin (MULTIVITAMIN WITH MINERALS) TABS tablet Take 1 tablet by mouth daily. Unknown strengfth    [provider]  ?nateglinide (STARLIX) 120 MG tablet TAKE 1 TABLET BY MOUTH EVERY DAY 07/16/21   Haydee Salter, MD  ?omeprazole (PRILOSEC) 40 MG capsule Take 40 mg by mouth 2 (two) times  daily.    [provider]  ?OXYGEN Inhale 4 L/min into the lungs as directed. 24 hours a day    [provider]  ?spironolactone (ALDACTONE) 25 MG tablet Take 12.5 mg by mouth daily.    Pro

## 2021-11-09 ENCOUNTER — Inpatient Hospital Stay (HOSPITAL_COMMUNITY): Payer: Medicare PPO | Admitting: Anesthesiology

## 2021-11-09 ENCOUNTER — Inpatient Hospital Stay (HOSPITAL_COMMUNITY)
Admit: 2021-11-09 | Discharge: 2021-11-09 | Disposition: A | Discharge status: Home / Self Care | Payer: Medicare PPO | Attending: Pulmonary Disease | Admitting: Pulmonary Disease

## 2021-11-09 ENCOUNTER — Inpatient Hospital Stay (HOSPITAL_COMMUNITY): Payer: Medicare PPO

## 2021-11-09 ENCOUNTER — Other Ambulatory Visit (HOSPITAL_COMMUNITY): Payer: Medicare PPO

## 2021-11-09 DIAGNOSIS — R001 Bradycardia, unspecified: Secondary | ICD-10-CM | POA: Diagnosis not present

## 2021-11-09 DIAGNOSIS — J9611 Chronic respiratory failure with hypoxia: Secondary | ICD-10-CM

## 2021-11-09 DIAGNOSIS — I50811 Acute right heart failure: Secondary | ICD-10-CM | POA: Diagnosis not present

## 2021-11-09 DIAGNOSIS — G9341 Metabolic encephalopathy: Secondary | ICD-10-CM | POA: Diagnosis not present

## 2021-11-09 DIAGNOSIS — R0603 Acute respiratory distress: Secondary | ICD-10-CM

## 2021-11-09 DIAGNOSIS — I469 Cardiac arrest, cause unspecified: Secondary | ICD-10-CM

## 2021-11-09 LAB — CBC WITH DIFFERENTIAL/PLATELET
Abs Immature Granulocytes: 0.05 10*3/uL (ref 0.00–0.07)
Basophils Absolute: 0.1 10*3/uL (ref 0.0–0.1)
Basophils Relative: 1 %
Eosinophils Absolute: 0.1 10*3/uL (ref 0.0–0.5)
Eosinophils Relative: 1 %
HCT: 44.7 % (ref 36.0–46.0)
Hemoglobin: 13 g/dL (ref 12.0–15.0)
Immature Granulocytes: 0 %
Lymphocytes Relative: 27 %
Lymphs Abs: 3.6 10*3/uL (ref 0.7–4.0)
MCH: 27.7 pg (ref 26.0–34.0)
MCHC: 29.1 g/dL — ABNORMAL LOW (ref 30.0–36.0)
MCV: 95.3 fL (ref 80.0–100.0)
Monocytes Absolute: 1.4 10*3/uL — ABNORMAL HIGH (ref 0.1–1.0)
Monocytes Relative: 10 %
Neutro Abs: 8.2 10*3/uL — ABNORMAL HIGH (ref 1.7–7.7)
Neutrophils Relative %: 61 %
Platelets: 232 10*3/uL (ref 150–400)
RBC: 4.69 MIL/uL (ref 3.87–5.11)
RDW: 17.7 % — ABNORMAL HIGH (ref 11.5–15.5)
WBC: 13.4 10*3/uL — ABNORMAL HIGH (ref 4.0–10.5)
nRBC: 0.9 % — ABNORMAL HIGH (ref 0.0–0.2)

## 2021-11-09 LAB — LACTIC ACID, PLASMA
Lactic Acid, Venous: >9 mmol/L (ref 0.5–1.9)
Lactic Acid, Venous: 1.4 mmol/L (ref 0.5–1.9)

## 2021-11-09 LAB — COMPREHENSIVE METABOLIC PANEL
ALT: 104 U/L — ABNORMAL HIGH (ref 0–44)
AST: 216 U/L — ABNORMAL HIGH (ref 15–41)
Albumin: 3 g/dL — ABNORMAL LOW (ref 3.5–5.0)
Alkaline Phosphatase: 79 U/L (ref 38–126)
Anion gap: 9 (ref 5–15)
BUN: 43 mg/dL — ABNORMAL HIGH (ref 8–23)
CO2: 28 mmol/L (ref 22–32)
Calcium: 8.4 mg/dL — ABNORMAL LOW (ref 8.9–10.3)
Chloride: 103 mmol/L (ref 98–111)
Creatinine, Ser: 1.9 mg/dL — ABNORMAL HIGH (ref 0.44–1.00)
GFR, Estimated: 26 mL/min — ABNORMAL LOW (ref >60)
Glucose, Bld: 128 mg/dL — ABNORMAL HIGH (ref 70–99)
Potassium: 5.7 mmol/L — ABNORMAL HIGH (ref 3.5–5.1)
Sodium: 140 mmol/L (ref 135–145)
Total Bilirubin: 1.5 mg/dL — ABNORMAL HIGH (ref 0.3–1.2)
Total Protein: 5.5 g/dL — ABNORMAL LOW (ref 6.5–8.1)

## 2021-11-09 LAB — T4, FREE: Free T4: 1.34 ng/dL — ABNORMAL HIGH (ref 0.61–1.12)

## 2021-11-09 LAB — BRAIN NATRIURETIC PEPTIDE: B Natriuretic Peptide: >4500 pg/mL — ABNORMAL HIGH (ref 0.0–100.0)

## 2021-11-09 LAB — ECHOCARDIOGRAM LIMITED
Area-P 1/2: 2.69 cm2
Height: 60 in
S' Lateral: 2.4 cm
Weight: 2458.57 oz

## 2021-11-09 LAB — BLOOD GAS, VENOUS
Acid-Base Excess: 6 mmol/L — ABNORMAL HIGH (ref 0.0–2.0)
Bicarbonate: 33.3 mmol/L — ABNORMAL HIGH (ref 20.0–28.0)
Drawn by: 46457
Patient temperature: 37.3
pCO2, Ven: 60 mmHg (ref 44–60)
pH, Ven: 7.36 (ref 7.25–7.43)
pO2, Ven: 66 mmHg — ABNORMAL HIGH (ref 32–45)

## 2021-11-09 LAB — BASIC METABOLIC PANEL
Anion gap: 6 (ref 5–15)
BUN: 40 mg/dL — ABNORMAL HIGH (ref 8–23)
CO2: 34 mmol/L — ABNORMAL HIGH (ref 22–32)
Calcium: 9.2 mg/dL (ref 8.9–10.3)
Chloride: 103 mmol/L (ref 98–111)
Creatinine, Ser: 1.74 mg/dL — ABNORMAL HIGH (ref 0.44–1.00)
GFR, Estimated: 29 mL/min — ABNORMAL LOW (ref >60)
Glucose, Bld: 242 mg/dL — ABNORMAL HIGH (ref 70–99)
Potassium: 4.9 mmol/L (ref 3.5–5.1)
Sodium: 143 mmol/L (ref 135–145)

## 2021-11-09 LAB — COOXEMETRY PANEL
Carboxyhemoglobin: 2.5 % — ABNORMAL HIGH (ref 0.5–1.5)
Methemoglobin: 0.7 % (ref 0.0–1.5)
O2 Saturation: 59.2 %
Total hemoglobin: 12 g/dL (ref 12.0–16.0)

## 2021-11-09 LAB — MRSA NEXT GEN BY PCR, NASAL: MRSA by PCR Next Gen: NOT DETECTED

## 2021-11-09 LAB — HIV ANTIBODY (ROUTINE TESTING W REFLEX): HIV Screen 4th Generation wRfx: NONREACTIVE

## 2021-11-09 LAB — LIPASE, BLOOD: Lipase: 34 U/L (ref 11–51)

## 2021-11-09 LAB — GLUCOSE, CAPILLARY
Glucose-Capillary: 160 mg/dL — ABNORMAL HIGH (ref 70–99)
Glucose-Capillary: 153 mg/dL — ABNORMAL HIGH (ref 70–99)
Glucose-Capillary: 117 mg/dL — ABNORMAL HIGH (ref 70–99)
Glucose-Capillary: 188 mg/dL — ABNORMAL HIGH (ref 70–99)

## 2021-11-09 LAB — TROPONIN I (HIGH SENSITIVITY)
Troponin I (High Sensitivity): 56 ng/L — ABNORMAL HIGH (ref <18)
Troponin I (High Sensitivity): 88 ng/L — ABNORMAL HIGH (ref <18)

## 2021-11-09 MED ORDER — GUAIFENESIN 100 MG/5ML PO LIQD
15.0000 mL | Freq: Four times a day (QID) | ORAL | Status: DC
  Administered 2021-11-09 – 2021-11-10 (×3): 15 mL
  Filled 2021-11-09 (×3): qty 20

## 2021-11-09 MED ORDER — MIDAZOLAM HCL 2 MG/2ML IJ SOLN
1.0000 mg | INTRAMUSCULAR | Status: DC | PRN
  Administered 2021-11-09 (×2): 1 mg via INTRAVENOUS
  Filled 2021-11-09 (×2): qty 2

## 2021-11-09 MED ORDER — CHLORHEXIDINE GLUCONATE CLOTH 2 % EX PADS
6.0000 | MEDICATED_PAD | Freq: Every day | CUTANEOUS | Status: DC
  Administered 2021-11-10 (×2): 6 via TOPICAL

## 2021-11-09 MED ORDER — SODIUM ZIRCONIUM CYCLOSILICATE 10 G PO PACK
10.0000 g | PACK | Freq: Once | ORAL | Status: AC
  Administered 2021-11-09: 10 g
  Filled 2021-11-09: qty 1

## 2021-11-09 MED ORDER — LORATADINE 10 MG PO TABS: 10.0000 mg | ORAL_TABLET | Freq: Every day | ORAL | Status: DC | PRN

## 2021-11-09 MED ORDER — CHLORHEXIDINE GLUCONATE 0.12% ORAL RINSE (MEDLINE KIT)
15.0000 mL | Freq: Two times a day (BID) | OROMUCOSAL | Status: DC
  Administered 2021-11-09 – 2021-11-10 (×2): 15 mL via OROMUCOSAL

## 2021-11-09 MED ORDER — ACETAMINOPHEN 650 MG RE SUPP
650.0000 mg | Freq: Four times a day (QID) | RECTAL | Status: DC | PRN
Start: 2021-11-09 — End: 2021-11-11

## 2021-11-09 MED ORDER — FENTANYL BOLUS VIA INFUSION
25.0000 ug | INTRAVENOUS | Status: DC | PRN
  Filled 2021-11-09: qty 100

## 2021-11-09 MED ORDER — SODIUM CHLORIDE 0.9 % IV SOLN: 250.0000 mL | INTRAVENOUS | Status: DC

## 2021-11-09 MED ORDER — ATROPINE SULFATE 1 MG/10ML IJ SOSY
PREFILLED_SYRINGE | INTRAMUSCULAR | Status: AC
  Filled 2021-11-09: qty 10

## 2021-11-09 MED ORDER — FENTANYL CITRATE (PF) 100 MCG/2ML IJ SOLN
INTRAMUSCULAR | Status: AC
  Administered 2021-11-09: 50 ug via INTRAVENOUS
  Filled 2021-11-09: qty 2

## 2021-11-09 MED ORDER — CALCIUM CARBONATE ANTACID 500 MG PO CHEW
1.0000 | CHEWABLE_TABLET | ORAL | Status: DC | PRN
  Filled 2021-11-09: qty 1

## 2021-11-09 MED ORDER — FENTANYL CITRATE (PF) 100 MCG/2ML IJ SOLN
INTRAMUSCULAR | Status: AC
  Administered 2021-11-09: 100 ug via INTRAVENOUS
  Filled 2021-11-09: qty 2

## 2021-11-09 MED ORDER — CALCIUM CARBONATE ANTACID 500 MG PO CHEW: 1.0000 | CHEWABLE_TABLET | ORAL | Status: DC | PRN

## 2021-11-09 MED ORDER — POLYETHYLENE GLYCOL 3350 17 G PO PACK
17.0000 g | PACK | Freq: Every day | ORAL | Status: DC
  Administered 2021-11-09 – 2021-11-10 (×2): 17 g
  Filled 2021-11-09 (×2): qty 1

## 2021-11-09 MED ORDER — RIVAROXABAN 15 MG PO TABS
15.0000 mg | ORAL_TABLET | Freq: Every day | ORAL | Status: DC
  Administered 2021-11-09 – 2021-11-10 (×2): 15 mg
  Filled 2021-11-09 (×2): qty 1

## 2021-11-09 MED ORDER — PHENYLEPHRINE 80 MCG/ML (10ML) SYRINGE FOR IV PUSH (FOR BLOOD PRESSURE SUPPORT)
PREFILLED_SYRINGE | INTRAVENOUS | Status: DC | PRN
  Administered 2021-11-09: 160 ug via INTRAVENOUS

## 2021-11-09 MED ORDER — MIDAZOLAM HCL 2 MG/2ML IJ SOLN: 1.0000 mg | INTRAMUSCULAR | Status: DC | PRN

## 2021-11-09 MED ORDER — ORAL CARE MOUTH RINSE
15.0000 mL | OROMUCOSAL | Status: DC
  Administered 2021-11-09 – 2021-11-10 (×8): 15 mL via OROMUCOSAL

## 2021-11-09 MED ORDER — FEBUXOSTAT 40 MG PO TABS
40.0000 mg | ORAL_TABLET | Freq: Every day | ORAL | Status: DC
  Administered 2021-11-10: 40 mg
  Filled 2021-11-09 (×2): qty 1

## 2021-11-09 MED ORDER — ACETAMINOPHEN 160 MG/5ML PO SOLN
650.0000 mg | Freq: Four times a day (QID) | ORAL | Status: DC | PRN
Start: 2021-11-09 — End: 2021-11-11
  Administered 2021-11-10: 650 mg
  Filled 2021-11-09: qty 20.3

## 2021-11-09 MED ORDER — ATORVASTATIN CALCIUM 10 MG PO TABS
20.0000 mg | ORAL_TABLET | Freq: Every day | ORAL | Status: DC
  Administered 2021-11-10: 20 mg
  Filled 2021-11-09: qty 2

## 2021-11-09 MED ORDER — LEVETIRACETAM 100 MG/ML PO SOLN
500.0000 mg | Freq: Two times a day (BID) | ORAL | Status: DC
  Administered 2021-11-09 – 2021-11-10 (×3): 500 mg
  Filled 2021-11-09 (×4): qty 5

## 2021-11-09 MED ORDER — FENTANYL CITRATE PF 50 MCG/ML IJ SOSY
25.0000 ug | PREFILLED_SYRINGE | Freq: Once | INTRAMUSCULAR | Status: AC
  Administered 2021-11-09: 25 ug via INTRAVENOUS

## 2021-11-09 MED ORDER — PREDNISONE 20 MG PO TABS
20.0000 mg | ORAL_TABLET | Freq: Every day | ORAL | Status: DC
  Administered 2021-11-10: 20 mg
  Filled 2021-11-09: qty 1

## 2021-11-09 MED ORDER — SUCCINYLCHOLINE CHLORIDE 200 MG/10ML IV SOSY
PREFILLED_SYRINGE | INTRAVENOUS | Status: DC | PRN
Start: 2021-11-09 — End: 2021-11-09
  Administered 2021-11-09: 100 mg via INTRAVENOUS
  Administered 2021-11-09: 40 mg via INTRAVENOUS

## 2021-11-09 MED ORDER — MONTELUKAST SODIUM 10 MG PO TABS
10.0000 mg | ORAL_TABLET | Freq: Every day | ORAL | Status: DC
  Administered 2021-11-09 – 2021-11-10 (×2): 10 mg
  Filled 2021-11-09 (×2): qty 1

## 2021-11-09 MED ORDER — FENTANYL CITRATE (PF) 100 MCG/2ML IJ SOLN: 100.0000 ug | Freq: Once | INTRAMUSCULAR | Status: AC

## 2021-11-09 MED ORDER — PROPOFOL 10 MG/ML IV BOLUS
INTRAVENOUS | Status: DC | PRN
  Administered 2021-11-09: 50 mg via INTRAVENOUS

## 2021-11-09 MED ORDER — TREPROSTINIL 0.6 MG/ML IN SOLN: 9.0000 ug | Freq: Four times a day (QID) | RESPIRATORY_TRACT | Status: DC

## 2021-11-09 MED ORDER — INSULIN ASPART 100 UNIT/ML IJ SOLN
0.0000 [IU] | INTRAMUSCULAR | Status: DC
  Administered 2021-11-09: 3 [IU] via SUBCUTANEOUS
  Administered 2021-11-10 (×4): 2 [IU] via SUBCUTANEOUS

## 2021-11-09 MED ORDER — FENTANYL CITRATE (PF) 100 MCG/2ML IJ SOLN
100.0000 ug | Freq: Once | INTRAMUSCULAR | Status: AC
Start: 2021-11-09 — End: 2021-11-09
  Administered 2021-11-09: 50 ug via INTRAVENOUS

## 2021-11-09 MED ORDER — PANTOPRAZOLE 2 MG/ML SUSPENSION
40.0000 mg | Freq: Every day | ORAL | Status: DC
  Administered 2021-11-10: 40 mg
  Filled 2021-11-09: qty 20

## 2021-11-09 MED ORDER — FENTANYL 2500MCG IN NS 250ML (10MCG/ML) PREMIX INFUSION
25.0000 ug/h | INTRAVENOUS | Status: DC
  Administered 2021-11-09: 25 ug/h via INTRAVENOUS
  Filled 2021-11-09: qty 250

## 2021-11-09 MED ORDER — DOCUSATE SODIUM 50 MG/5ML PO LIQD
100.0000 mg | Freq: Two times a day (BID) | ORAL | Status: DC
  Administered 2021-11-09 – 2021-11-10 (×3): 100 mg
  Filled 2021-11-09 (×3): qty 10

## 2021-11-09 MED ORDER — SODIUM ZIRCONIUM CYCLOSILICATE 10 G PO PACK: 10.0000 g | PACK | Freq: Once | ORAL | Status: DC

## 2021-11-09 MED ORDER — NOREPINEPHRINE 4 MG/250ML-% IV SOLN
2.0000 ug/min | INTRAVENOUS | Status: DC
  Administered 2021-11-09: 5 ug/min via INTRAVENOUS
  Administered 2021-11-09: 10 ug/min via INTRAVENOUS
  Filled 2021-11-09: qty 250

## 2021-11-09 MED ORDER — LEVOTHYROXINE SODIUM 100 MCG PO TABS
100.0000 ug | ORAL_TABLET | Freq: Every day | ORAL | Status: DC
  Administered 2021-11-10 – 2021-11-11 (×2): 100 ug
  Filled 2021-11-09 (×2): qty 1

## 2021-11-09 NOTE — Progress Notes (Addendum)
Called urgently to see patient for bradycardia. History obtained from RN and chart. ? ?81 year old woman PMH including severe pulmonary hypertension, cor pulmonale, chronic hypoxic respiratory failure, pulmonary embolism, presented with shortness of breath was admitted for acute on chronic hypoxic respiratory failure secondary to COPD exacerbation, acute on chronic diastolic CHF. ? ?Per RN, pt c/o indigestion, then HR went down to 20s and became unresponsive. ? ?On arrival to room patient unresponsive lying flat, agonal respirations, heart rate in the 20s.  Given 1 mg atropine with rapid increase in heart rate into the 50s but no change in encephalopathy or agonal respirations.  Transcutaneous pacer pads placed but not utilized.  Did not lose pulse.  Anesthesia quickly arrived here intubated patient.  Rapid response at bedside quickly and provided expert care.  Hypotensive, given bolus of saline and started on Levophed.  EKG nonacute. ? ?Blood sugar in the 150s per RN.  Only complaint patient had less indigestion.  Ambulated earlier today with physical therapy.  Also discussed with husband, no additional complaints elicited.   ? ?Chemistry panel this morning with normal potassium, hyperglycemic.  Euthermic. 5Hs, 5Ts reviewed; no narcotics or sedatives this AM, no obvious causes. Consider MI or stroke. ? ? EKG showed sinus tachycardia no acute changes. ?Independent interpretation. ? ?Patient was transferred to [facility], ICU, I discussed the case directly with Dr. Erin Fulling and Gerald Leitz from [facility]. Appreciate their expert care. ? ?I updated husband and son in waiting room. ? ?TRH will sign off for now. ? ?Murray Hodgkins, MD ?[facility] ? ?Critical care time 35 minutes ?

## 2021-11-09 NOTE — Plan of Care (Signed)
?  Interdisciplinary Goals of Care Family Meeting ? ? ?Date carried out:: 11/09/2021 ? ?Location of the meeting: Conference room ? ?Member's involved: Nurse Practitioner, Bedside Registered Nurse, and Family Member or next of kin ? ?Durable Power of Tour manager: Shared among patients spouse and son   ? ?Discussion: We discussed goals of care for [facility] .  Both spouse and son were updated at bedside regarding patient critical illness with multi-system organ dysfunction. Near end of conversations patients husband stated that patient has a living will and it states she would not wont prolong aggressive interventions if quality of life can not be preserved. The family would like to continue aggressive care currently.  ? ?Code status: Full Code ? ?Disposition: Continue current acute care ? ? ?Time spent for the meeting: 30 min ? ?Kysha Muralles D. Harris ?11/09/2021, 3:33 PM ? ?

## 2021-11-09 NOTE — Progress Notes (Signed)
Critical lactic, troponin, and potasium reported to CCM NP.  ?

## 2021-11-09 NOTE — Progress Notes (Signed)
Pt sitting up in chair request to return to bed, tech and nurse at bedside. Pt stood and sat down, asked pt if she was OK or if she felt dizzy. Pt responded NO, pt proceeded to take steps and sit on side of bed, pt laid back and then became unresponsive. Pt HR decreased to 30's then 20's with change in mental status, pt did not respond to verbal or tactile stimulation. Husband at bedside during the event. Pt had previous had been alert and talking throughout the morning and fed herself breakfast. Pt participated with PT/OT this am with a dizzy episode noted. As event continued, rapid response called, CN at bedside, Code called, Hospitalist at bedside. Son later arrived. Pt given Atropine , Phenylephrine, then Levophed given, and transported to ICU 1222, report given to Anson Fret, Therapist, sports. SRP, RN ?

## 2021-11-09 NOTE — Progress Notes (Addendum)
Initial Nutrition Assessment ? ?DOCUMENTATION CODES:  ? ?Not applicable ? ?INTERVENTION:  ?- once OGT/NGT placed and placement confirmed, recommend Vital AF 1.2 @ 25 ml/hr to advance by 10 ml every 8 hours to goal rate of 45 ml/hr with 45 ml Prosource TF. ? ?- at goal rate, this regimen will provide 1336 kcal (104% kcal need), 92 grams protein, and 876 ml free water. ? ? ?NUTRITION DIAGNOSIS:  ? ?Inadequate oral intake related to inability to eat as evidenced by NPO status. ? ?GOAL:  ? ?Patient will meet greater than or equal to 90% of their needs ? ?MONITOR:  ? ?Vent status, Labs, Weight trends ? ?REASON FOR ASSESSMENT:  ? ?Ventilator, Consult ?Assessment of nutrition requirement/status ? ?ASSESSMENT:  ? ?81 y.o. 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 Patient presented to the ED due to worsening cough, wheezing, and shortness of breath that began 3-4 days prior. She was admitted for acute R heart failure. ? ?Patient was a Code Blue earlier this afternoon. Able to talk with CCM MD, NP, and patient's son and husband.  ? ?The last time patient ate was a few sips of coffee, medications in a few bites of applesauce, and 1/2 of a breakfast sandwich.  ? ?Over the past few weeks to months patient has had a decreased appetite and decreased oral intake. Her husband gives the example that she would eat 2/3 of 1/2 of a sandwich.  ? ?Husband reports that she was experiencing abdominal pain and constipation at home and had laxatives, pepto bismol, and tums to aid with these symptoms.  ? ?He reports that UBW was ~160 lb and the last time she weighed this was 7-12 months ago and that weight more recently has been closer to 140 lb.  ? ?Weight today is 154 lb, weight on 4/24 was documented as 145 lb (which appears to be a stated weight), and PTA the most  recently documented weight was 147 lb on 05/23/21. ? ? ?Patient is currently intubated on ventilator support ?MV: 7 L/min ?Temp (24hrs), Avg:98.3 ?F (36.8 ?C), Min:97.6 ?F (36.4 ?C), Max:99.1 ?F (37.3 ?C) ?Propofol: none ?BP: 116/56 and MAP: 75 ? ?Labs reviewed; CBGs: 160 and 153 mg/dl, BUN: 40 mg/dl, creatinine: 1.74 mg/dl, GFR: 29 ml/min. ? ?Medications reviewed; sliding scale novolog, 100 mcg oral synthroid/day, 40 mg oral protonix/day, 20 mg deltasone/day, 12.5 mg aldactone/day.  ?  ? ?NUTRITION - FOCUSED PHYSICAL EXAM: ? ?Flowsheet Row Most Recent Value  ?Orbital Region Unable to assess  [ETT holder]  ?Upper Arm Region No depletion  ?Thoracic and Lumbar Region No depletion  ?Buccal Region Unable to assess  [ETT holder]  ?Temple Region No depletion  ?Clavicle Bone Region No depletion  ?Clavicle and Acromion Bone Region No depletion  ?Scapular Bone Region No depletion  ?Dorsal Hand No depletion  ?Patellar Region No depletion  ?Anterior Thigh Region No depletion  ?Posterior Calf Region No depletion  ?Edema (RD Assessment) Mild  [BLE]  ?Hair Reviewed  ?Eyes Unable to assess  ?Mouth Unable to assess  ?Skin Reviewed  ?Nails Reviewed  ? ?  ? ? ?Diet Order:   ?Diet Order   ? ?       ?  Diet heart healthy/carb modified Room service appropriate? Yes; Fluid consistency: Thin  Diet effective now       ?  ? ?  ?  ? ?  ? ? ?  EDUCATION NEEDS:  ? ?Not appropriate for education at this time ? ?Skin:  Skin Assessment: Reviewed RN Assessment ? ?Last BM:  PTA/unknown ? ?Height:  ? ?Ht Readings from Last 1 Encounters:  ?11/08/21 5' (1.524 m)  ? ? ?Weight:  ? ?Wt Readings from Last 1 Encounters:  ?11/09/21 69.7 kg  ? ? ? ?BMI:  Body mass index is 30.01 kg/m?. ? ?Estimated Nutritional Needs:  ?Kcal:  1284 kcal ?Protein:  84-104 grams ?Fluid:  >/= 1.2 L/day ? ? ? ? ?Jarome Matin, MS, RD, LDN ?Registered Dietitian II ?Inpatient Clinical Nutrition ?RD pager # and on-call/weekend pager # available in [place]  ? ?

## 2021-11-09 NOTE — Progress Notes (Addendum)
Pt intubated by anesthesia on 4W and then transported to ICU with no complications. ?

## 2021-11-09 NOTE — Hospital Course (Signed)
Called urgently to see patient for bradycardia. History obtained from RN and chart. ? ?81 year old woman PMH including severe pulmonary hypertension, cor pulmonale, chronic hypoxic respiratory failure, pulmonary embolism, presented with shortness of breath was admitted for acute on chronic hypoxic respiratory failure secondary to COPD exacerbation, acute on chronic diastolic CHF. ? ?Per RN, pt c/o indigestion, then HR went down to 20s and became unresponsive. ? ?On arrival to room patient unresponsive lying flat, agonal respirations, heart rate in the 20s.  Given 1 mg atropine with rapid increase in heart rate into the 50s but no change in encephalopathy or agonal respirations.  Transcutaneous pacer pads placed but not utilized.  Did not lose pulse.  Anesthesia quickly arrived here intubated patient.  Rapid response at bedside quickly and provided expert care.  Hypotensive, given bolus of saline and started on Levophed.  EKG nonacute. ? ?Blood sugar in the 150s per RN.  Only complaint patient had less indigestion.  Ambulated earlier today with physical therapy.  Also discussed with husband, no additional complaints elicited.   ? ?Chemistry panel this morning with normal potassium, hyperglycemic.  Euthermic. 5Hs, 5Ts reviewed; no narcotics or sedatives this AM, no obvious causes. Consider MI or stroke. ? ? EKG showed sinus tachycardia no acute changes. ?Independent interpretation. ? ?Patient was transferred to [facility], ICU, I discussed the case directly with Dr. Erin Fulling and Gerald Leitz from [facility]. Appreciate their expert care. ? ?I updated husband and son in waiting room. ? ?TRH will sign off for now ? ?Murray Hodgkins, MD ?[facility] ? ?

## 2021-11-09 NOTE — Evaluation (Signed)
Occupational Therapy Evaluation ?Patient Details ?Name: Amanda Short ?MRN: [id number] ?DOB: 12/16/1940 ?Today's Date: 11/09/2021 ? ? ?History of Present Illness Amanda Short is a 81 y.o. 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 Patient admitted for acute right heart failure.  ? ?Clinical Impression ?  ?Amanda Short is an 81 year old woman admitted to hospital with above medical history and presents with generalized weakness, decreased activity tolerance, impaired balance and decreased cardiopulmonary endurance. Patient needing min assist to ambulate with walker and increased assistance with ADLs including mod-max assist for LB ADLs and toileting. Patient found on 5 L and wears 4 L at home and o2 sat stable in the 90s with oxygen. On the way to the bathroom patient complained of dizziness, sweating and weak legs. Initially thought to be orthostatic in nature. With second stand - monitored this time - 123/79 in sitting and 126/67 in standing. Patient reports history of vertigo/dizziness at home. Patient also may have some memory deficits. Patient will benefit from skilled OT services while in hospital to improve deficits and learn compensatory strategies as needed in order to return to PLOF.  Patient and spouse agreeable to [facility] therapy at discharge - pending patient progress in hospital.  ?  ?   ? ?Recommendations for follow up therapy are one component of a multi-disciplinary discharge planning process, led by the attending physician.  Recommendations may be updated based on patient status, additional functional criteria and insurance authorization.  ? ?Follow Up Recommendations ? Home health OT  ?  ?Assistance Recommended at Discharge  Frequent or constant Supervision/Assistance  ?Patient can return home with the following A little help with walking and/or transfers;A lot of help with bathing/dressing/bathroom;Assistance with cooking/housework;Direct supervision/assist for medications management;Direct supervision/assist for financial management;Assist for transportation ? ?  ?Functional Status Assessment ? Patient has had a recent decline in their functional status and demonstrates the ability to make significant improvements in function in a reasonable and predictable amount of time.  ?Equipment Recommendations ? None recommended by OT  ?  ?Recommendations for Other Services   ? ? ?  ?Precautions / Restrictions Precautions ?Precautions: Fall ?Precaution Comments: Hx of vertigo, dizziness, SOB ?Restrictions ?Weight Bearing Restrictions: No  ? ?  ? ?Mobility Bed Mobility ?Overal bed mobility: Needs Assistance ?Bed Mobility: Supine to Sit ?  ?  ?Supine to sit: Min assist, HOB elevated ?  ?  ?General bed mobility comments: min assist to pull trunk forward ?  ? ?Transfers ?Overall transfer level: Needs assistance ?Equipment used: Rolling walker (2 wheels) ?Transfers: Sit to/from Stand, Bed to chair/wheelchair/BSC ?Sit to Stand: Min assist ?  ?  ?  ?  ?  ?General transfer comment: Min assist for standing and steadying wtih ambulation ?  ? ?  ?Balance Overall balance assessment: Mild deficits observed, not formally tested ?  ?  ?  ?  ?  ?  ?  ?  ?  ?  ?  ?  ?  ?  ?  ?  ?  ?  ?   ? ?ADL either performed or assessed with clinical judgement  ? ?ADL Overall ADL's : Needs assistance/impaired ?Eating/Feeding: Set up;Sitting ?  ?Grooming: Set up;Sitting ?  ?Upper Body Bathing: Set up;Sitting ?  ?Lower Body Bathing: Moderate assistance;Sit  to/from stand ?  ?Upper Body Dressing : Set up;Sitting ?  ?Lower Body Dressing: Maximal assistance;Sit to/from stand ?  ?Toilet Transfer: Minimal assistance;BSC/3in1;Rolling walker (2 wheels) ?Toilet Transfer Details  (indicate cue type and reason): attempted to get to bathroom but unable to make it due to  complaints of dizziness, sweathing and weak legs. ?Toileting- Clothing Manipulation and Hygiene: Maximal assistance;Sit to/from stand ?Toileting - Clothing Manipulation Details (indicate cue type and reason): reliant on hands on walker ?  ?  ?Functional mobility during ADLs: Minimal assistance;Rolling walker (2 wheels) ?   ? ? ? ?Vision Patient Visual Report: No change from baseline ?   ?   ?Perception   ?  ?Praxis   ?  ? ?Pertinent Vitals/Pain Pain Assessment ?Pain Assessment: No/denies pain  ? ? ? ?Hand Dominance Right ?  ?Extremity/Trunk Assessment Upper Extremity Assessment ?Upper Extremity Assessment: Generalized weakness ?  ?Lower Extremity Assessment ?Lower Extremity Assessment: Generalized weakness ?  ?Cervical / Trunk Assessment ?Cervical / Trunk Assessment: Kyphotic ?  ?Communication Communication ?Communication: No difficulties ?  ?Cognition Arousal/Alertness: Awake/alert ?Behavior During Therapy: [facility] for tasks assessed/performed ?Overall Cognitive Status: Within Functional Limits for tasks assessed ?  ?  ?  ?  ?  ?  ?  ?  ?  ?  ?  ?  ?  ?  ?  ?  ?General Comments: Appears to have some memory deficits - husband correcting her in regards to timeline of events ?  ?  ?General Comments    ? ?  ?Exercises   ?  ?Shoulder Instructions    ? ? ?Home Living Family/patient expects to be discharged to:: Private residence ?Living Arrangements: Spouse/significant other ?Available Help at Discharge: Family;Available 24 hours/day ?Type of Home: House ?Home Access: Level entry ?  ?  ?Home Layout: One level ?  ?  ?Bathroom Shower/Tub: Walk-in shower ?  ?Bathroom Toilet: Standard ?  ?  ?Home Equipment: Conservation officer, nature (2 wheels);Cane - single point;BSC/3in1;Shower seat ?  ?  ?  ? ?  ?Prior Functioning/Environment Prior Level of Function : Needs assist ?  ?  ?  ?  ?  ?  ?Mobility Comments: uses rollator - has intermittent assistance  from husband to steady her. Husband reports limited ambulation at home ?ADLs Comments: needs assistance ?  ? ?  ?  ?OT Problem List: Decreased strength;Decreased activity tolerance;Cardiopulmonary status limiting activity;Impaired balance (sitting and/or standing);Decreased knowledge of use of DME or AE ?  ?   ?OT Treatment/Interventions: Self-care/ADL training;Therapeutic exercise;DME and/or AE instruction;Therapeutic activities;Balance training;Patient/family education  ?  ?OT Goals(Current goals can be found in the care plan section) Acute Rehab OT Goals ?Patient Stated Goal: feel stronger, walk to bathroom ?OT Goal Formulation: With patient ?Time For Goal Achievement: 11/23/21 ?Potential to Achieve Goals: Good  ?OT Frequency: Min 2X/week ?  ? ?Co-evaluation   ?  ?  ?  ?  ? ?  ?AM-PAC OT "6 Clicks" Daily Activity     ?Outcome Measure Help from another person eating meals?: A Little ?Help from another person taking care of personal grooming?: A Little ?Help from another person toileting, which includes using toliet, bedpan, or urinal?: A Lot ?Help from another person bathing (including washing, rinsing, drying)?: A Lot ?Help from another person to put on and taking off regular upper body clothing?: A Little ?Help from another person to put on and taking off regular lower body clothing?: A Lot ?6 Click Score: 15 ?  ?End of Session Equipment Utilized During  Treatment: Rolling walker (2 wheels);Gait belt;Oxygen ?Nurse Communication: Mobility status ? ?Activity Tolerance: Patient tolerated treatment well ?Patient left: in chair;with call bell/phone within reach;with chair alarm set;with family/visitor present ? ?OT Visit Diagnosis: Muscle weakness (generalized) (M62.81)  ?              ?Time: 8768-1157 ?OT Time Calculation (min): 40 min ?Charges:  OT General Charges ?$OT Visit: 1 Visit ?OT Evaluation ?$OT Eval Low Complexity: 1 Low ? ?Vittoria Noreen, OTR/L ?Acute Care Rehab Services  ?Office [phone] ?Pager: [phone]   ? ?Timmia Cogburn L Edoardo Laforte ?11/09/2021, 12:43 PM ?

## 2021-11-09 NOTE — Anesthesia Procedure Notes (Signed)
Procedure Name: Intubation ?Date/Time: 11/09/2021 1:35 PM ?Performed by: Santa Lighter, MD ?Pre-anesthesia Checklist: Patient identified, Emergency Drugs available, Suction available and Patient being monitored ?Patient Re-evaluated:Patient Re-evaluated prior to induction ?Oxygen Delivery Method: Circle system utilized ?Preoxygenation: Pre-oxygenation with 100% oxygen ?Induction Type: IV induction and Rapid sequence ?Ventilation: Mask ventilation without difficulty ?Laryngoscope Size: Mac and 3 ?Grade View: Grade I ?Tube type: Oral ?Tube size: 7.5 mm ?Number of attempts: 1 ?Airway Equipment and Method: Stylet and Oral airway ?Placement Confirmation: ETT inserted through vocal cords under direct vision, positive ETCO2 and breath sounds checked- equal and bilateral ?Tube secured with: Tape ?Dental Injury: Teeth and Oropharynx as per pre-operative assessment  ? ? ? ? ?

## 2021-11-09 NOTE — Evaluation (Signed)
Physical Therapy Evaluation ?Patient Details ?Name: Amanda Short ?MRN: [id number] ?DOB: 02/28/1941 ?Today's Date: 11/09/2021 ? ?History of Present Illness ? Amanda Short is a 81 y.o. 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 Patient admitted for acute right heart failure.  ?Clinical Impression ? The patient assisted to ambulating to BR but began complaints of feeling dizzy and sweaty. Recliner brought up for safety. BP 123/79, SPO2 on 5 L 94%.    ?Spouse present to provide information, stating that ambulation is limited in home and he assists patient with all aspects of mobility and ADL's. Patient and spouse anticipate  to DC home. Recommend HHPT. Spouse is interested in getting a  light weight WC to use in home when needed for safe mobility. ?+ ?Pt admitted with above diagnosis.  Pt currently with functional limitations due to the deficits listed below (see PT Problem List). Pt will benefit from skilled PT to increase their independence and safety with mobility to allow discharge to the venue listed below.   ? ?   ? ?Recommendations for follow up therapy are one component of a multi-disciplinary discharge planning process, led by the attending physician.  Recommendations may be updated based on patient status, additional functional criteria and insurance authorization. ? ?Follow Up Recommendations Home health PT ? ?  ?Assistance Recommended at Discharge Frequent or constant Supervision/Assistance  ?Patient can return home with the following ? A lot of help with walking and/or transfers;A little help with bathing/dressing/bathroom;Help with stairs or ramp for entrance;Assistance with cooking/housework;Assist for transportation ? ?  ? Small light  weight WC   ?Recommendations for Other Services ?    ?  ?Functional Status Assessment Patient has had a recent decline in their functional status and demonstrates the ability to make significant improvements in function in a reasonable and predictable amount of time.  ? ?  ?Precautions / Restrictions Precautions ?Precautions: Fall ?Precaution Comments: Hx of vertigo, dizziness, SOB ?Restrictions ?Weight Bearing Restrictions: No  ? ?  ? ?Mobility ? Bed Mobility ?  ?  ?  ?  ?  ?  ?  ?  ?  ? ?Transfers ?  ?  ?  ?  ?  ?  ?  ?  ?  ?  ?  ? ?Ambulation/Gait ?Ambulation/Gait assistance: Min assist, +2 safety/equipment ?Gait Distance (Feet): 10 Feet ?Assistive device: Rolling walker (2 wheels) ?Gait Pattern/deviations: Step-through pattern, Drifts right/left ?Gait velocity: decr ?  ?  ?General Gait Details: patient requiring multimodal cues to stay moving towrds BR, veering to the right. patient stating that she feels dizzy and hot. Recliner brought up ? ?Stairs ?  ?  ?  ?  ?  ? ?Wheelchair Mobility ?  ? ?Modified Rankin (Stroke Patients Only) ?  ? ?  ? ?Balance Overall balance assessment: Mild deficits observed, not formally tested ?  ?  ?  ?  ?  ?  ?  ?  ?  ?  ?  ?  ?  ?  ?  ?  ?  ?  ?   ? ? ? ?Pertinent Vitals/Pain    ? ? ?Home Living Family/patient expects to be discharged to:: Private residence ?Living Arrangements: Spouse/significant other ?Available Help at Discharge: Family;Available 24 hours/day ?Type of Home: House ?Home Access: Level  entry ?  ?  ?  ?Home Layout: One level ?Home Equipment: Conservation officer, nature (2 wheels);Cane - single point;BSC/3in1;Shower seat ?   ?  ?Prior Function Prior Level of Function : Needs assist ?  ?  ?  ?  ?  ?  ?Mobility Comments: uses rollator - has intermittent assistance from husband to steady her. Husband reports limited ambulation at home and he has to assist her at all times/ ?ADLs Comments: needs assistance ?  ? ? ?Hand Dominance  ? Dominant Hand: Right ? ?  ?Extremity/Trunk Assessment   ? Upper Extremity Assessment ?Upper Extremity Assessment: Generalized weakness ?  ? ?Lower Extremity Assessment ?Lower Extremity Assessment: Generalized weakness ?  ? ?Cervical / Trunk Assessment ?Cervical / Trunk Assessment: Kyphotic  ?Communication  ? Communication: No difficulties  ?Cognition   ?  ?  ?  ?  ?  ?  ?  ?  ?  ?  ?  ?  ?  ?  ?  ?  ?  ?  ?  ?  ?  ? ?  ?General Comments General comments (skin integrity, edema, etc.): reliant on RW and  close assistnace ? ?  ?Exercises    ? ?Assessment/Plan  ?  ?PT Assessment Patient needs continued PT services  ?PT Problem List Decreased strength;Decreased mobility;Decreased safety awareness;Decreased knowledge of precautions;Decreased activity tolerance;Decreased cognition;Decreased balance;Decreased knowledge of use of DME ? ?   ?  ?PT Treatment Interventions DME instruction;Therapeutic activities;Cognitive remediation;Gait training;Therapeutic exercise;Patient/family education;Functional mobility training   ? ?PT Goals (Current goals can be found in the Care Plan section)  ?Acute Rehab PT Goals ?Patient Stated Goal: go home ?PT Goal Formulation: With patient/family ?Time For Goal Achievement: 11/23/21 ?Potential to Achieve Goals: Fair ? ?  ?Frequency Min 3X/week ?  ? ? ?Co-evaluation   ?  ?  ?  ?  ? ? ?  ?AM-PAC PT "6 Clicks" Mobility  ?Outcome Measure Help needed turning from your back to your side while in a flat bed without using bedrails?: A Little ?Help needed moving from lying on your back to sitting on the side of a flat bed without using bedrails?: A Little ?Help needed moving to and from a bed to a chair (including a wheelchair)?: A Little ?Help needed standing up from a chair using your arms (e.g., wheelchair or bedside chair)?: A Little ?Help needed to walk in hospital room?: A Lot ?Help needed climbing 3-5 steps with a railing? : Total ?6 Click Score: 15 ? ?  ?End of Session Equipment Utilized During Treatment: Gait belt ?Activity Tolerance: Treatment  limited secondary to medical complications (Comment) ?Patient left: in chair;with call bell/phone within reach;with family/visitor present;with chair alarm set ?Nurse Communication: Mobility status ?PT Visit Diagnosis: Unsteadiness on feet (R26.81);Difficulty in walking, not elsewhere classified (R26.2) ?  ? ?Time: 5339-1792 ?PT Time Calculation (min) (ACUTE ONLY): 42 min ? ? ?Charges:   PT Evaluation ?$PT Eval Low Complexity: 1 Low ?  ?  ?   ? ? ?Tresa Endo PT ?Acute Rehabilitation Services ?Pager [phone] ?Office [phone] ? ? ?Tyrika Newman, Shella Maxim ?11/09/2021, 12:48 PM ? ?

## 2021-11-09 NOTE — Progress Notes (Signed)
Vent ck completed. NP currently working on getting femoral a-line placed. Pt with no complications at this time.  ?

## 2021-11-09 NOTE — Progress Notes (Signed)
?  Echocardiogram ?2D Echocardiogram has been not been performed. Echo was attempted however central line was being placed.  Sonographer was asked to come back later.   ? ?Amanda Short ?11/09/2021, 2:36 PM ?Documented for [name]  ?

## 2021-11-09 NOTE — Consult Note (Signed)
? ?NAMEYolande Short, MRN:  [id number], DOB:  01-Sep-1940, LOS: 1 ?ADMISSION DATE:  11/03/2021, CONSULTATION DATE:  11/09/2021 ?REFERRING MD:  Dr. Jackolyn Confer, CHIEF COMPLAINT:  Cardiac arrest   ? ?History of Present Illness:  ?Amanda Short is an 81 year old female with a extensive past medical history significant for but not limited to diastolic congestive heart failure, chronic respiratory failure with hypoxia requiring 4 L nasal cannula at baseline, severe pulmonary hypertension with cor pulmonale, Gold stage II COPD, OSA on CPAP CKD stage IIIb, insulin-dependent diabetes, prior pulmonary embolism anticoagulated on Xarelto, seizure disorder and gout who initially presented to the emergency department at [facility] 4/24 for complaints of worsening cough, wheezing, and shortness of breath.  Patient reports symptoms of URI 3 to 4 days prior to admission.  Attempted to uptitrate home oxygen with little improvement in dyspnea. ? ?On ED arrival patient was seen tachypneic and tachycardic with mild hypoxia on 4 L nasal cannula.  Chest x-ray negative for acute infiltrates.  Patient was admitted per hospitalist team for management of acute COPD and CHF exasperation's. ? ?Mid morning of 4/26 patient suddenly became bradycardic and unresponsive. The bradycardia responded to atropine push but mentation remained poor with development of agonal respirations. Code was called and patient was intubated per anesthesia. PCCM consulted for further management.  ? ?Pertinent  Medical History  ?Extensive past medical history significant for but not limited to diastolic congestive heart failure, chronic respiratory failure with hypoxia requiring 4 L nasal cannula at baseline, severe pulmonary hypertension with cor pulmonale, Gold stage II COPD, OSA on CPAP CKD stage IIIb, insulin-dependent diabetes, prior pulmonary embolism anticoagulated on Xarelto, seizure disorder and gout ? ?Significant Hospital Events: ?Including procedures,  antibiotic start and stop dates in addition to other pertinent events   ?4/24 admitted initially for complaints of shortness of breath, cough, and wheezing admitted per hospitalist team for management of acute COPD and CHF exasperation's ?4/26 Sudden onset of bradycardia followed by respiratory arrest, intubated and transferred to ICU ? ?Interim History / Subjective:  ?As above ? ?Objective   ?Blood pressure (!) 132/99, pulse 74, temperature 98.2 ?F (36.8 ?C), temperature source Oral, resp. rate 19, height 5' (1.524 m), weight 69.7 kg, SpO2 93 %. ?   ?   ? ?Intake/Output Summary (Last 24 hours) at 11/09/2021 1336 ?Last data filed at 11/08/2021 2036 ?Gross per 24 hour  ?Intake --  ?Output 750 ml  ?Net -750 ml  ? ?Filed Weights  ? 10/17/2021 2130 11/08/21 1417 11/09/21 0500  ?Weight: 65.8 kg 70.3 kg 69.7 kg  ? ? ?Examination: ?General: Acute on chronically ill appearing elderly female lying in bed on mechanical ventilation, in NAD ?HEENT: ETT, MM pink/moist, PERRL,  ?Neuro: Minimal response to painful stimuli  ?CV: s1s2 regular rate and rhythm, no murmur, rubs, or gallops,  ?PULM:  Slight rhonchi bilaterally, tolerating vent, no increased work of breathing, no added breath sounds ?GI: soft, bowel sounds active in all 4 quadrants, non-tender, non-distended ?Extremities: warm/dry, no edema  ?Skin: no rashes or lesions ? ?Resolved Hospital Problem list   ? ? ?Assessment & Plan:  ?In hospital respiratory arrest ?-Suddenly onset bradycardic and unresponsive. The bradycardia responded to atropine push but mentation remained poor with development of agonal respirations. Code was called and patient was intubated per anesthesia.  ?Acute exasperation of congestive heart failure decompensated ?-BNP 4/24 3,386 ?History of HTN/HLD ?-Home medication includes Lipitor, Dital exam, Lasix, metoprolol, ?P: ?Continuous telemetry  ?Trend BNP ?Strict intake and output  ?  Daily weight to assess volume status ?Daily assessment for need to diurese  with the use of IV lasix   ?Closely monitor renal function and electrolytes  ?Vent support as below  ?Ensure hemodynamic control ?Tend troponin  ?Follow EKGs ?Stat ECHO ? ?Acute on chronic hypoxic respiratory failure following respiratory arrest  ?Acute exasperation of COPD with history of stage Gold 2 COPD ?OSA ?Severe pulmonary hypertension/core pulmonary ?History of pulmonary embolism anticoagulated on Xarelto ?P: ?Continue ventilator support with lung protective strategies  ?Wean PEEP and FiO2 for sats greater than 90%. ?Head of bed elevated 30 degrees. ?Plateau pressures less than 30 cm H20.  ?Follow intermittent chest x-ray and ABG.   ?SAT/SBT as tolerated, mentation preclude extubation  ?Ensure adequate pulmonary hygiene  ?Follow cultures  ?VAP bundle in place  ?PAD protocol ?Scheduled BDs ? ?AKI superimposed on CKD stage IIIb ?-Creatinine 1.46, GRR 41 on admit 4/23. Creatinine 1.74, GFR 29  by 4/26 ?P: ?Follow renal function  ?Monitor urine output ?Trend Bmet ?Avoid nephrotoxins ?Ensure adequate renal perfusion  ? ?History of insulin-dependent diabetes ?-Home medication includes Tyler Aas ?P: ?SSI  ?CBG goal 140-180 ?CBG checks q4hrs  ? ?History of hypothyroidism ?P: ?Continue home Synthroid ? ?Seizure disorder ?-Home medications include Keppra ?History of anxiety/depression ?-Home medication includes Cymbalta ?P: ?Maintain neuro protective measures; goal for eurothermia, euglycemia, eunatermia, normoxia, and PCO2 goal of 35-40 ?Nutrition and bowel regiment  ?Seizure precautions  ?Aspirations precautions  ?Continue home [place]  ?Consider EEG  ?Check prolactin level ? ?Best Practice (right click and "Reselect all SmartList Selections" daily)  ? ?Diet/type: NPO ?DVT prophylaxis: systemic heparin ?GI prophylaxis: PPI ?Lines: Central line and Arterial Line ?Foley:  Yes, and it is still needed ?Code Status:  full code ?Last date of multidisciplinary goals of care discussion: See separate plan of care note  ? ?Labs    ?CBC: ?Recent Labs  ?Lab 11/06/21 ?1818 10/27/2021 ?2145 11/08/21 ?0435  ?WBC 9.3 8.4 9.4  ?NEUTROABS 6.3  --  5.6  ?HGB 12.0 12.3 11.8*  ?HCT 39.4 40.6 38.0  ?MCV 91.2 91.2 89.6  ?PLT 257 251 243  ? ? ?Basic Metabolic Panel: ?Recent Labs  ?Lab 11/06/21 ?1818 11/12/2021 ?2145 11/08/21 ?0435 11/09/21 ?0429  ?NA 136 144 144 143  ?K 3.9 4.4 3.7 4.9  ?CL 101 105 103 103  ?CO2 _0 34*  ?GLUCOSE 80 110* 46* 242*  ?BUN 25* 31* 29* 40*  ?CREATININE 1.32* 1.48* 1.35* 1.74*  ?CALCIUM 9.0 9.7 9.6 9.2  ?MG  --   --  1.6*  --   ? ?GFR: ?Estimated Creatinine Clearance: 22.5 mL/min (A) (by C-G formula based on SCr of 1.74 mg/dL (H)). ?Recent Labs  ?Lab 11/06/21 ?1818 11/03/2021 ?2145 11/08/21 ?0435  ?WBC 9.3 8.4 9.4  ? ? ?Liver Function Tests: ?Recent Labs  ?Lab 11/06/21 ?1818 11/08/21 ?0435  ?AST 42* 50*  ?ALT 27 27  ?ALKPHOS 70 70  ?BILITOT 0.9 0.9  ?PROT 5.8* 6.1*  ?ALBUMIN 3.3* 3.4*  ? ?No results for input(s): LIPASE, AMYLASE in the last 168 hours. ?No results for input(s): AMMONIA in the last 168 hours. ? ?ABG ?   ?Component Value Date/Time  ? PHART 7.361 05/24/2020 1441  ? PCO2ART 56.5 (H) 05/24/2020 1441  ? PO2ART 85 05/24/2020 1441  ? HCO3 37.2 (H) 11/08/2021 1429  ? TCO2 34 (H) 05/24/2020 1441  ? O2SAT 33.3 11/08/2021 1429  ?  ? ?Coagulation Profile: ?No results for input(s): INR, PROTIME in the last 168 hours. ? ?Cardiac  Enzymes: ?No results for input(s): CKTOTAL, CKMB, CKMBINDEX, TROPONINI in the last 168 hours. ? ?HbA1C: ?Hgb A1c MFr Bld  ?Date/Time Value Ref Range Status  ?11/08/2021 04:35 AM 7.7 (H) 4.8 - 5.6 % Final  ?  Comment:  ?  (NOTE) ?Pre diabetes:          5.7%-6.4% ? ?Diabetes:              >6.4% ? ?Glycemic control for   <7.0% ?adults with diabetes ?  ?04/01/2021 09:00 AM 6.7 (H) 4.6 - 6.5 % Final  ?  Comment:  ?  Glycemic Control Guidelines for People with Diabetes:Non Diabetic:  <6%Goal of Therapy: <7%Additional Action Suggested:  >8%   ? ? ?CBG: ?Recent Labs  ?Lab 11/08/21 ?1203 11/08/21 ?1626  11/08/21 ?2200 11/09/21 ?0737 11/09/21 ?1246  ?GLUCAP 308* 200* 281* 160* 153*  ? ? ?Review of Systems:   ?Unable to assess  ? ?Past Medical History:  ?She,  has a past medical history of Abdominal pain, Abn

## 2021-11-09 NOTE — Progress Notes (Signed)
Per NP a-line unsuccessful will order VBG. ?

## 2021-11-09 NOTE — Code Documentation (Signed)
Code Blue called 1330 due to sudden unresponsiveness and change in cardiac rhythm. HR decreased from 70s to 30s, shortly after patient had ambulated and complained of indigestion. 1332 Atropine given, HR 48, patient remaining unresponsive, bradycardic, cyanotic, and agonal respirations. BP 112/44. CBG elevated. Husband at bedside. Intubated by anesthesia at 1335. BP 47/34 @ 1336, phenylephrine given by anesthesia. 12 lead EKG completed. Lab work drawn. Peripheral Levophed started at 10 mcg per PCCM. Patient transported to ICU at 1345.  ? ?No chest compressions or shocks needed during event. ?

## 2021-11-09 NOTE — Progress Notes (Signed)
-  4/26: Spoke with pt and husband about bringing in home troprostinil. Pt seemed reluctant and wants to stop it (having HA, stomach upset, and rash) (this is not new according to her outpatient notes). Placed call to [facility] to consult [facility]. He said she CANNOT stop it but agreeable to let her go down on dose. Discussed with pt and husband and they have a f/u appointment on 5/15. ?- Discussed with Dr. Sarajane Jews. Orders modified. Husband will brin in 4/27. ? ?Latriece Anstine S. Alford Highland, PharmD, BCPS ?Clinical Staff Pharmacist ?[email or web address] ? ?

## 2021-11-09 NOTE — Procedures (Signed)
Central Venous Catheter Insertion Procedure Note ? ?Amanda Short  ?[id number]  ?April 15, 1941 ? ?Date:11/09/21  ?Time:4:46 PM  ? ?Provider Performing:Bueford Arp D. Harris  ? ?Procedure: Insertion of Non-tunneled Central Venous Catheter(36556) with US guidance (48016)  ? ? ? ?Indication(s) ?Medication administration ? ?Consent ?Risks of the procedure as well as the alternatives and risks of each were explained to the patient and/or caregiver.  Consent for the procedure was obtained and is signed in the bedside chart ? ?Anesthesia ?Topical only with 1% lidocaine  ? ?Timeout ?Verified patient identification, verified procedure, site/side was marked, verified correct patient position, special equipment/implants available, medications/allergies/relevant history reviewed, required imaging and test results available. ? ?Sterile Technique ?Maximal sterile technique including full sterile barrier drape, hand hygiene, sterile gown, sterile gloves, mask, hair covering, sterile ultrasound probe cover (if used). ? ?Procedure Description ?Area of catheter insertion was cleaned with chlorhexidine and draped in sterile fashion.  With real-time ultrasound guidance a central venous catheter was placed into the left internal jugular vein. Nonpulsatile blood flow and easy flushing noted in all ports.  The catheter was sutured in place and sterile dressing applied. ? ?Complications/Tolerance ?None; patient tolerated the procedure well. ?Chest X-ray is ordered to verify placement for internal jugular or subclavian cannulation.   Chest x-ray is not ordered for femoral cannulation. ? ?EBL ?Minimal ? ?Specimen(s) ?None ? ?Beatrice Ziehm D. Harris, NP-C ?[facility] ?Personal contact information can be found on Amion  ?11/09/2021, 4:46 PM ? ? ? ?

## 2021-11-09 NOTE — Progress Notes (Signed)
?  Echocardiogram ?2D Echocardiogram has been performed. ? ?Amanda Short ?11/09/2021, 5:16 PM ?

## 2021-11-09 NOTE — TOC Progression Note (Signed)
Transition of Care (TOC) - Progression Note  ? ? ?Patient Details  ?Name: Amanda Short ?MRN: [id number] ?Date of Birth: 1940-08-28 ? ?Transition of Care (TOC) CM/SW Contact  ?Purcell Mouton, RN ?Phone Number: ?11/09/2021, 11:20 AM ? ?Clinical Narrative:    ? ?Pt from home with spouse. TOC will continue to follow for discharge needs.  ? ?Expected Discharge Plan: Home/Self Care ?Barriers to Discharge: No Barriers Identified ? ?Expected Discharge Plan and Services ?Expected Discharge Plan: Home/Self Care ?  ?  ?Post Acute Care Choice: Home Health, [place] ?Living arrangements for the past 2 months: [place] ?                ?  ?  ?  ?  ?  ?  ?  ?  ?  ?  ? ? ?Social Determinants of Health (SDOH) Interventions ?  ? ?Readmission Risk Interventions ?   ? View : No data to display.  ?  ?  ?  ? ? ?

## 2021-11-09 NOTE — Progress Notes (Signed)
ABG on hold waiting on a-line MD aware.  ?

## 2021-11-09 NOTE — Progress Notes (Signed)
EEG complete - results pending 

## 2021-11-09 NOTE — Progress Notes (Signed)
Chaplain engaged in an initial visit with Marilla's husband and son.  Chaplain responded to Code and was able to provide support to Mr. Toutant outside of the room.  Husband shared that they have been married 63 years and it will be 71 years later this year.  They were friends in high school and got married shortly after.  They have lived in [place] and in [place], and eventually decided to settle in [place].  They have one son, a daughter in law, and a grandson.  Venetta's grandson was able to visit her last night.  Family also shared about Scarleth's healthcare journey.  They voiced that she has not been doing well the last three years and she had a similar event like a Code Blue in 2018.  They voiced that they thought they had lost her then.  While both are realistic about this time, they voiced not being prepared for this moment.   ? ?Chaplain provided space for them both to share about Kayci.  They described her as being strong.  Chaplain also offered reflective listening, a compassionate presence and touch, and prayer with them in the waiting area.  ? ?Chaplains will follow-up as needed.  ? ? ? 11/09/21 1400  ?Clinical Encounter Type  ?Visited With Patient and family together  ?Visit Type Initial;Code;Spiritual support  ?Spiritual Encounters  ?Spiritual Needs Prayer;Emotional;Grief support  ? ? ?

## 2021-11-10 DIAGNOSIS — I469 Cardiac arrest, cause unspecified: Secondary | ICD-10-CM

## 2021-11-10 DIAGNOSIS — I50811 Acute right heart failure: Secondary | ICD-10-CM | POA: Diagnosis not present

## 2021-11-10 DIAGNOSIS — R4182 Altered mental status, unspecified: Secondary | ICD-10-CM

## 2021-11-10 LAB — CBC WITH DIFFERENTIAL/PLATELET
Abs Immature Granulocytes: 0.05 10*3/uL (ref 0.00–0.07)
Basophils Absolute: 0 10*3/uL (ref 0.0–0.1)
Basophils Relative: 0 %
Eosinophils Absolute: 0 10*3/uL (ref 0.0–0.5)
Eosinophils Relative: 0 %
HCT: 41.7 % (ref 36.0–46.0)
Hemoglobin: 12.8 g/dL (ref 12.0–15.0)
Immature Granulocytes: 0 %
Lymphocytes Relative: 17 %
Lymphs Abs: 2.4 10*3/uL (ref 0.7–4.0)
MCH: 28.3 pg (ref 26.0–34.0)
MCHC: 30.7 g/dL (ref 30.0–36.0)
MCV: 92.3 fL (ref 80.0–100.0)
Monocytes Absolute: 2.2 10*3/uL — ABNORMAL HIGH (ref 0.1–1.0)
Monocytes Relative: 16 %
Neutro Abs: 9.2 10*3/uL — ABNORMAL HIGH (ref 1.7–7.7)
Neutrophils Relative %: 67 %
Platelets: 271 10*3/uL (ref 150–400)
RBC: 4.52 MIL/uL (ref 3.87–5.11)
RDW: 17.2 % — ABNORMAL HIGH (ref 11.5–15.5)
WBC: 13.9 10*3/uL — ABNORMAL HIGH (ref 4.0–10.5)
nRBC: 0.8 % — ABNORMAL HIGH (ref 0.0–0.2)

## 2021-11-10 LAB — BLOOD CULTURE ID PANEL (REFLEXED) - BCID2

## 2021-11-10 LAB — BASIC METABOLIC PANEL
Anion gap: 15 (ref 5–15)
BUN: 65 mg/dL — ABNORMAL HIGH (ref 8–23)
CO2: 25 mmol/L (ref 22–32)
Calcium: 8.3 mg/dL — ABNORMAL LOW (ref 8.9–10.3)
Chloride: 97 mmol/L — ABNORMAL LOW (ref 98–111)
Creatinine, Ser: 2.92 mg/dL — ABNORMAL HIGH (ref 0.44–1.00)
GFR, Estimated: 16 mL/min — ABNORMAL LOW (ref >60)
Glucose, Bld: 162 mg/dL — ABNORMAL HIGH (ref 70–99)
Potassium: 5.9 mmol/L — ABNORMAL HIGH (ref 3.5–5.1)
Sodium: 137 mmol/L (ref 135–145)

## 2021-11-10 LAB — GLUCOSE, CAPILLARY
Glucose-Capillary: 107 mg/dL — ABNORMAL HIGH (ref 70–99)
Glucose-Capillary: 123 mg/dL — ABNORMAL HIGH (ref 70–99)
Glucose-Capillary: 123 mg/dL — ABNORMAL HIGH (ref 70–99)
Glucose-Capillary: 124 mg/dL — ABNORMAL HIGH (ref 70–99)
Glucose-Capillary: 136 mg/dL — ABNORMAL HIGH (ref 70–99)
Glucose-Capillary: 139 mg/dL — ABNORMAL HIGH (ref 70–99)
Glucose-Capillary: 84 mg/dL (ref 70–99)

## 2021-11-10 LAB — TROPONIN I (HIGH SENSITIVITY)
Troponin I (High Sensitivity): 93 ng/L — ABNORMAL HIGH (ref <18)
Troponin I (High Sensitivity): 79 ng/L — ABNORMAL HIGH (ref <18)

## 2021-11-10 LAB — T3: T3, Total: 75 ng/dL (ref 71–180)

## 2021-11-10 LAB — COMPREHENSIVE METABOLIC PANEL
ALT: 179 U/L — ABNORMAL HIGH (ref 0–44)
AST: 277 U/L — ABNORMAL HIGH (ref 15–41)
Albumin: 3.2 g/dL — ABNORMAL LOW (ref 3.5–5.0)
Alkaline Phosphatase: 79 U/L (ref 38–126)
Anion gap: 9 (ref 5–15)
BUN: 60 mg/dL — ABNORMAL HIGH (ref 8–23)
CO2: 27 mmol/L (ref 22–32)
Calcium: 8.5 mg/dL — ABNORMAL LOW (ref 8.9–10.3)
Chloride: 100 mmol/L (ref 98–111)
Creatinine, Ser: 2.32 mg/dL — ABNORMAL HIGH (ref 0.44–1.00)
GFR, Estimated: 21 mL/min — ABNORMAL LOW (ref >60)
Glucose, Bld: 135 mg/dL — ABNORMAL HIGH (ref 70–99)
Potassium: 6.3 mmol/L (ref 3.5–5.1)
Sodium: 136 mmol/L (ref 135–145)
Total Bilirubin: 1.1 mg/dL (ref 0.3–1.2)
Total Protein: 5.6 g/dL — ABNORMAL LOW (ref 6.5–8.1)

## 2021-11-10 LAB — BRAIN NATRIURETIC PEPTIDE: B Natriuretic Peptide: >4500 pg/mL — ABNORMAL HIGH (ref 0.0–100.0)

## 2021-11-10 LAB — POTASSIUM
Potassium: 6.5 mmol/L (ref 3.5–5.1)
Potassium: 4.6 mmol/L (ref 3.5–5.1)
Potassium: 5.7 mmol/L — ABNORMAL HIGH (ref 3.5–5.1)
Potassium: 5.7 mmol/L — ABNORMAL HIGH (ref 3.5–5.1)

## 2021-11-10 LAB — MAGNESIUM: Magnesium: 1.8 mg/dL (ref 1.7–2.4)

## 2021-11-10 LAB — PHOSPHORUS: Phosphorus: 8.5 mg/dL — ABNORMAL HIGH (ref 2.5–4.6)

## 2021-11-10 LAB — PROLACTIN: Prolactin: 33.2 ng/mL — ABNORMAL HIGH (ref 4.8–23.3)

## 2021-11-10 MED ORDER — NOREPINEPHRINE 4 MG/250ML-% IV SOLN
0.0000 ug/min | INTRAVENOUS | Status: DC
  Administered 2021-11-10: 9 ug/min via INTRAVENOUS
  Administered 2021-11-10: 7 ug/min via INTRAVENOUS
  Administered 2021-11-10: 9 ug/min via INTRAVENOUS
  Administered 2021-11-11: 7 ug/min via INTRAVENOUS
  Filled 2021-11-10 (×4): qty 250

## 2021-11-10 MED ORDER — DEXTROSE 50 % IV SOLN
1.0000 | Freq: Once | INTRAVENOUS | Status: AC
  Administered 2021-11-10: 50 mL via INTRAVENOUS
  Filled 2021-11-10: qty 50

## 2021-11-10 MED ORDER — ALBUMIN HUMAN 25 % IV SOLN
25.0000 g | Freq: Four times a day (QID) | INTRAVENOUS | Status: AC
  Administered 2021-11-10 – 2021-11-11 (×3): 25 g via INTRAVENOUS
  Filled 2021-11-10 (×3): qty 100

## 2021-11-10 MED ORDER — VANCOMYCIN HCL 500 MG/100ML IV SOLN: 500.0000 mg | INTRAVENOUS | Status: DC

## 2021-11-10 MED ORDER — REVEFENACIN 175 MCG/3ML IN SOLN
175.0000 ug | Freq: Every day | RESPIRATORY_TRACT | Status: DC
  Administered 2021-11-11: 175 ug via RESPIRATORY_TRACT
  Filled 2021-11-10 (×2): qty 3

## 2021-11-10 MED ORDER — ARFORMOTEROL TARTRATE 15 MCG/2ML IN NEBU
15.0000 ug | INHALATION_SOLUTION | Freq: Two times a day (BID) | RESPIRATORY_TRACT | Status: DC
  Administered 2021-11-10 – 2021-11-11 (×3): 15 ug via RESPIRATORY_TRACT
  Filled 2021-11-10 (×3): qty 2

## 2021-11-10 MED ORDER — SODIUM ZIRCONIUM CYCLOSILICATE 10 G PO PACK
10.0000 g | PACK | Freq: Once | ORAL | Status: AC
  Administered 2021-11-10: 10 g
  Filled 2021-11-10: qty 1

## 2021-11-10 MED ORDER — FUROSEMIDE 10 MG/ML IJ SOLN
40.0000 mg | Freq: Once | INTRAMUSCULAR | Status: AC
  Administered 2021-11-10: 40 mg via INTRAVENOUS
  Filled 2021-11-10: qty 4

## 2021-11-10 MED ORDER — INSULIN ASPART 100 UNIT/ML IV SOLN
5.0000 [IU] | Freq: Once | INTRAVENOUS | Status: AC
  Administered 2021-11-10: 5 [IU] via INTRAVENOUS

## 2021-11-10 MED ORDER — VANCOMYCIN HCL 750 MG/150ML IV SOLN
750.0000 mg | Freq: Once | INTRAVENOUS | Status: AC
  Administered 2021-11-10: 750 mg via INTRAVENOUS
  Filled 2021-11-10: qty 150

## 2021-11-10 MED ORDER — ORAL CARE MOUTH RINSE
15.0000 mL | Freq: Two times a day (BID) | OROMUCOSAL | Status: DC
  Administered 2021-11-10: 15 mL via OROMUCOSAL

## 2021-11-10 NOTE — Progress Notes (Signed)
? ?NAMELorenza Short, MRN:  [id number], DOB:  [birth year], LOS: 2 ?ADMISSION DATE:  11/09/2021, CONSULTATION DATE:  11/09/2021 ?REFERRING MD:  Dr. Jackolyn Confer, CHIEF COMPLAINT:  Cardiac arrest   ? ?History of Present Illness:  ?Amanda Short is an 81 year old female with a extensive past medical history significant for but not limited to diastolic congestive heart failure, chronic respiratory failure with hypoxia requiring 4 L nasal cannula at baseline, severe pulmonary hypertension with cor pulmonale, Gold stage II COPD, OSA on CPAP CKD stage IIIb, insulin-dependent diabetes, prior pulmonary embolism anticoagulated on Xarelto, seizure disorder and gout who initially presented to the emergency department at [facility] 4/24 for complaints of worsening cough, wheezing, and shortness of breath.  Patient reports symptoms of URI 3 to 4 days prior to admission.  Attempted to uptitrate home oxygen with little improvement in dyspnea. ? ?On ED arrival patient was seen tachypneic and tachycardic with mild hypoxia on 4 L nasal cannula.  Chest x-ray negative for acute infiltrates.  Patient was admitted per hospitalist team for management of acute COPD and CHF exasperation's. ? ?Mid morning of 4/26 patient suddenly became bradycardic and unresponsive. The bradycardia responded to atropine push but mentation remained poor with development of agonal respirations. Code was called and patient was intubated per anesthesia. PCCM consulted for further management.  ? ?Pertinent  Medical History  ?Extensive past medical history significant for but not limited to diastolic congestive heart failure, chronic respiratory failure with hypoxia requiring 4 L nasal cannula at baseline, severe pulmonary hypertension with cor pulmonale, Gold stage II COPD, OSA on CPAP CKD stage IIIb, insulin-dependent diabetes, prior pulmonary embolism anticoagulated on Xarelto, seizure disorder and gout ? ?Significant Hospital Events: ?Including procedures,  antibiotic start and stop dates in addition to other pertinent events   ?4/24 admitted initially for complaints of shortness of breath, cough, and wheezing admitted per hospitalist team for management of acute COPD and CHF exasperation's ?4/26 Sudden onset of bradycardia followed by respiratory arrest, intubated and transferred to ICU ? ?Interim History / Subjective:  ? ?No acute events overnight ? ?AM labs showing potassium of 6.4, eLink ordered IV insulin.  ? ?She is on 6-14mg of levophed ? ?Weaned to FiO2 of 50% on vent, PEEP 5 ? ?Objective   ?Blood pressure (!) 93/55, pulse 62, temperature (!) 97.5 ?F (36.4 ?C), temperature source Axillary, resp. rate 14, height 5' (1.524 m), weight 69.7 kg, SpO2 94 %. ?   ?Vent Mode: PRVC ?FiO2 (%):  [50 %-100 %] 50 % ?Set Rate:  [18 bmp] 18 bmp ?Vt Set:  [360 mL] 360 mL ?PEEP:  [5 cmH20] 5 cmH20 ?Plateau Pressure:  [13 cLKG40-10cmH20] 15 cmH20  ? ?Intake/Output Summary (Last 24 hours) at 11/10/2021 0802 ?Last data filed at 11/10/2021 [phone]?Gross per 24 hour  ?Intake 417.26 ml  ?Output 200 ml  ?Net 217.26 ml  ? ?Filed Weights  ? 11/08/21 1417 11/09/21 0500 11/10/21 0500  ?Weight: 70.3 kg 69.7 kg 69.7 kg  ? ?Examination: ?General: elderly woman, chronically ill appearing, central and peripheral cyanosis present ?HEENT: [place]/AT, moist mucous membranes, sclera anicteric ?Neuro: moves all extremities spontaneously, PERRL ?CV: bradycardic, s1s2, no murmurs ?PULM: course breath sounds. No wheezing ?GI: soft, non-tender, non-distended, BS+ ?Extremities: cool to touch, no edema ?Skin: no rashes  ? ?ECHO 4/26 ?No wall motion abnormalities of LV and normal EF. Severe dilation of RV with mild systolic dysfunction.  ? ?Resolved Hospital Problem list   ? ? ?Assessment & Plan:  ?In hospital  respiratory arrest ?-Suddenly onset bradycardic and unresponsive. The bradycardia responded to atropine push but mentation remained poor with development of agonal respirations. Code was called and patient  was intubated per anesthesia.  ?Bradycardia  ?Possibly in setting of increased beta blocker dose and home diltiazem dose ?Acute on Chronic Congestive heart failure ?-BNP 4/27 > 4,500 ?History of HTN/HLD ?-Home medication includes Lipitor, Dital exam, Lasix, metoprolol, ?P: ?Continuous telemetry  ?Trend BNP ?Strict intake and output  ?Daily weight to assess volume status ?Closely monitor renal function and electrolytes  ?Vent support as below  ?Tend troponin  ?Follow EKGs ? ?Acute on chronic hypoxic respiratory failure following respiratory arrest  ?Acute exacerbation of COPD with history of stage Gold 2 COPD ?OSA ?Severe pulmonary hypertension/core pulmonary ?History of pulmonary embolism anticoagulated on Xarelto ?P: ?Continue ventilator support with lung protective strategies  ?VAP bundle in place  ?PAD protocol ?Wean PEEP and FiO2 for sats greater than 90%. ?Head of bed elevated 30 degrees. ?Plateau pressures less than 30 cm H20.  ?Follow intermittent chest x-ray and ABG.   ?SAT/SBT as tolerated ?ICS/LABA/LAMA nebulizer treatments ?Daily prednisone ?Follow cultures  ?Continue xarelto ?Hold treprostinil for now ? ?AKI superimposed on CKD stage IIIb ?Hyperkalemia ?-Creatinine 2.32 today, baseline 1.3. K 6.4 today ?P: ?Follow renal function  ?Monitor urine output ?Trend Bmet ?Avoid nephrotoxins ?Ensure adequate renal perfusion  ?IV insulin + lasix + lokelma for hyperkalemia ? ?History of insulin-dependent diabetes ?-Home medication includes Tyler Aas ?P: ?SSI  ?CBG goal 140-180 ?CBG checks q4hrs  ? ?History of hypothyroidism ?P: ?Continue home Synthroid ? ?Seizure disorder ?-Home medications include Keppra ?History of anxiety/depression ?-Home medication includes Cymbalta ?P: ?Maintain neuro protective measures; goal for eurothermia, euglycemia, eunatermia, normoxia, and PCO2 goal of 35-40 ?Nutrition and bowel regiment  ?Seizure precautions  ?Aspirations precautions  ?Continue home [place]  ?Consider EEG  ?Check  prolactin level ? ?Best Practice (right click and "Reselect all SmartList Selections" daily)  ? ?Diet/type: NPO ?DVT prophylaxis: DOAC ?GI prophylaxis: PPI ?Lines: Central line ?Foley:  Yes, and it is still needed ?Code Status:  full code ?Last date of multidisciplinary goals of care discussion: See separate plan of care note 4/26, palliative care consult placed 4/27. ? ?Labs   ?CBC: ?Recent Labs  ?Lab 11/06/21 ?1818 11/01/2021 ?2145 11/08/21 ?0435 11/09/21 ?1337 11/10/21 ?1610  ?WBC 9.3 8.4 9.4 13.4* 13.9*  ?NEUTROABS 6.3  --  5.6 8.2* 9.2*  ?HGB 12.0 12.3 11.8* 13.0 12.8  ?HCT 39.4 40.6 38.0 44.7 41.7  ?MCV 91.2 91.2 89.6 95.3 92.3  ?PLT 257 251 243 232 271  ? ? ?Basic Metabolic Panel: ?Recent Labs  ?Lab 11/08/2021 ?2145 11/08/21 ?0435 11/09/21 ?0429 11/09/21 ?1531 11/10/21 ?9604  ?NA 144 144 143 140 136  ?K 4.4 3.7 4.9 5.7* 6.3*  ?CL 105 103 103 103 100  ?CO2 31 31 34* 28 27  ?GLUCOSE 110* 46* 242* 128* 135*  ?BUN 31* 29* 40* 43* 60*  ?CREATININE 1.48* 1.35* 1.74* 1.90* 2.32*  ?CALCIUM 9.7 9.6 9.2 8.4* 8.5*  ?MG  --  1.6*  --   --  1.8  ?PHOS  --   --   --   --  8.5*  ? ?GFR: ?Estimated Creatinine Clearance: 16.9 mL/min (A) (by C-G formula based on SCr of 2.32 mg/dL (H)). ?Recent Labs  ?Lab 10/24/2021 ?2145 11/08/21 ?0435 11/09/21 ?1337 11/09/21 ?1757 11/10/21 ?5409  ?WBC 8.4 9.4 13.4*  --  13.9*  ?LATICACIDVEN  --   --  >9.0* 1.4  --   ? ? ?  Liver Function Tests: ?Recent Labs  ?Lab 11/06/21 ?1818 11/08/21 ?0435 11/09/21 ?1531 11/10/21 ?5176  ?AST 42* 50* 216* 277*  ?ALT 27 27 104* 179*  ?ALKPHOS 16 07 37 10  ?BILITOT 0.9 0.9 1.5* 1.1  ?PROT 5.8* 6.1* 5.5* 5.6*  ?ALBUMIN 3.3* 3.4* 3.0* 3.2*  ? ?Recent Labs  ?Lab 11/09/21 ?1757  ?LIPASE 34  ? ?No results for input(s): AMMONIA in the last 168 hours. ? ?ABG ?   ?Component Value Date/Time  ? PHART 7.361 05/24/2020 1441  ? PCO2ART 56.5 (H) 05/24/2020 1441  ? PO2ART 85 05/24/2020 1441  ? HCO3 33.3 (H) 11/09/2021 2015  ? TCO2 34 (H) 05/24/2020 1441  ? O2SAT 59.2 11/09/2021  1531  ?  ? ?Coagulation Profile: ?No results for input(s): INR, PROTIME in the last 168 hours. ? ?Cardiac Enzymes: ?No results for input(s): CKTOTAL, CKMB, CKMBINDEX, TROPONINI in the last 168 hours. ? ?HbA1C: ?

## 2021-11-10 NOTE — Progress Notes (Signed)
?  PT Cancellation Note ? ?Patient Details ?Name: Jaycelynn Knickerbocker ?MRN: [id number] ?DOB: December 17, 1940 ? ? ?Cancelled Treatment:    Reason Eval/Treat Not Completed: Medical issues which prohibited therapy, PT will follow for now, on ventilator.  ?Tresa Endo PT ?Acute Rehabilitation Services ?Pager [phone] ?Office [phone] ? ? ? ?Pascha Fogal, Shella Maxim ?11/10/2021, 6:45 AM ?

## 2021-11-10 NOTE — Progress Notes (Signed)
PHARMACY - PHYSICIAN COMMUNICATION ?CRITICAL VALUE ALERT - BLOOD CULTURE IDENTIFICATION (BCID) ? ?Amanda Short is an 81 y.o. female who presented to [facility] on 11/04/2021 with a chief complaint of wheezing, cough, shortness of breath.   ? ?Assessment:   ?1 pediatric bottle of 2 showed gram positive cocci in clusters, report status pending.   ?BCID detected staphylococcus species; staphylococcus epidermidis, methicillin resistance mecA/C. ? ?Name of physician (or Provider) Contacted: [place] ? ?Current antibiotics: none ? ?Changes to prescribed antibiotics recommended:  ?Recommendations accepted by provider to treat with vancomycin per Pharmacy consult ? ?Results for orders placed or performed during the hospital encounter of 11/04/2021  ?Blood Culture ID Panel (Reflexed) (Collected: 11/09/2021  5:54 PM)  ?Result Value Ref Range  ? Enterococcus faecalis NOT DETECTED NOT DETECTED  ? Enterococcus Faecium NOT DETECTED NOT DETECTED  ? Listeria monocytogenes NOT DETECTED NOT DETECTED  ? Staphylococcus species DETECTED (A) NOT DETECTED  ? Staphylococcus aureus (BCID) NOT DETECTED NOT DETECTED  ? Staphylococcus epidermidis DETECTED (A) NOT DETECTED  ? Staphylococcus lugdunensis NOT DETECTED NOT DETECTED  ? Streptococcus species NOT DETECTED NOT DETECTED  ? Streptococcus agalactiae NOT DETECTED NOT DETECTED  ? Streptococcus pneumoniae NOT DETECTED NOT DETECTED  ? Streptococcus pyogenes NOT DETECTED NOT DETECTED  ? A.calcoaceticus-baumannii NOT DETECTED NOT DETECTED  ? Bacteroides fragilis NOT DETECTED NOT DETECTED  ? Enterobacterales NOT DETECTED NOT DETECTED  ? Enterobacter cloacae complex NOT DETECTED NOT DETECTED  ? Escherichia coli NOT DETECTED NOT DETECTED  ? Klebsiella aerogenes NOT DETECTED NOT DETECTED  ? Klebsiella oxytoca NOT DETECTED NOT DETECTED  ? Klebsiella pneumoniae NOT DETECTED NOT DETECTED  ? Proteus species NOT DETECTED NOT DETECTED  ? Salmonella species NOT DETECTED NOT DETECTED  ? Serratia marcescens  NOT DETECTED NOT DETECTED  ? Haemophilus influenzae NOT DETECTED NOT DETECTED  ? Neisseria meningitidis NOT DETECTED NOT DETECTED  ? Pseudomonas aeruginosa NOT DETECTED NOT DETECTED  ? Stenotrophomonas maltophilia NOT DETECTED NOT DETECTED  ? Candida albicans NOT DETECTED NOT DETECTED  ? Candida auris NOT DETECTED NOT DETECTED  ? Candida glabrata NOT DETECTED NOT DETECTED  ? Candida krusei NOT DETECTED NOT DETECTED  ? Candida parapsilosis NOT DETECTED NOT DETECTED  ? Candida tropicalis NOT DETECTED NOT DETECTED  ? Cryptococcus neoformans/gattii NOT DETECTED NOT DETECTED  ? Methicillin resistance mecA/C DETECTED (A) NOT DETECTED  ? ? ? ?Royetta Asal, PharmD, BCPS ?Clinical Pharmacist ?[place] ?Please utilize Amion for appropriate phone number to reach the unit pharmacist ([place]) ?11/10/2021 4:17 PM ? ? ?

## 2021-11-10 NOTE — Procedures (Signed)
Extubation Procedure Note ? ?Patient Details:   ?Name: Amanda Short ?DOB: 1941/03/09 ?MRN: [id number] ?  ?Airway Documentation:  ?  ?Vent end date: 11/10/21 Vent end time: 1432  ? ?Evaluation ? O2 sats: stable throughout ?Complications: No apparent complications ?Patient did tolerate procedure well. ?Bilateral Breath Sounds: Diminished ?  ?Yes ? ?[name], [name]  ? ?Kenney Going A Gabriel Conry ?11/10/2021, 2:32 PM ? ?

## 2021-11-10 NOTE — Progress Notes (Signed)
eLink Physician-Brief Progress Note ?Patient Name: Amanda Short ?DOB: [birth year] ?MRN: [id number] ? ? ?Date of Service ? 11/10/2021  ?HPI/Events of Note ? K+ 6.3, it did not respond to [facility].  ?eICU Interventions ? Insulin 5 units + D 50 %  1 amp ordered per hyperkalemia protocol.  ? ? ? ?  ? ?Amanda Short ?11/10/2021, 6:53 AM ?

## 2021-11-10 NOTE — Progress Notes (Signed)
Pharmacy Antibiotic Note ? ?Amanda Short is a 81 y.o. female admitted on 11/08/2021 with acte right heart failure.  Active problems include pulmonary hypertension, COPD with acute exacerbation, and CHF.  Today, microbiology called to report 1 of 2 blood culture bottles positive for gram positive cocci in clusters, report status pending.  BCID detected staphylococcus species; staphylococcus epidermidis, methicillin resistance mecA/C. Pharmacy has been consulted for vancomycin dosing. ? ?Plan: ?Vancomycin 750 mg IV x 1 then 500 mg IV every 48 hours (Goal AUC 400-550. Expected AUC: 529.  SCr used: 2.92) ?Serum creatinine qday x 3 ?Monitor clinical progress, renal function, vancomycin levels if indicated ?F/U C&S, abx deescalation / LOT ? ? ? ?Height: 5' (152.4 cm) ?Weight: 69.7 kg (153 lb 10.6 oz) ?IBW/kg (Calculated) : 45.5 ? ?Temp (24hrs), Avg:98.3 ?F (36.8 ?C), Min:97.5 ?F (36.4 ?C), Max:99.1 ?F (37.3 ?C) ? ?Recent Labs  ?Lab 11/06/21 ?1818 10/30/2021 ?2145 11/08/21 ?0435 11/09/21 ?0429 11/09/21 ?1337 11/09/21 ?1531 11/09/21 ?1757 11/10/21 ?6503 11/10/21 ?1445  ?WBC 9.3 8.4 9.4  --  13.4*  --   --  13.9*  --   ?CREATININE 1.32* 1.48* 1.35* 1.74*  --  1.90*  --  2.32* 2.92*  ?LATICACIDVEN  --   --   --   --  >9.0*  --  1.4  --   --   ?  ?Estimated Creatinine Clearance: 13.4 mL/min (A) (by C-G formula based on SCr of 2.92 mg/dL (H)).   ? ?Allergies  ?Allergen Reactions  ? Allopurinol Nausea And Vomiting  ? Tyvaso [Treprostinil] Nausea And Vomiting and Other (See Comments)  ?  Headaches, cramps, sweatiness also  ? Aspirin Anxiety and Other (See Comments)  ?  Causes jitters  ? ? ?Antimicrobials this admission: ?4/27 vancomycin >>  ? ?Microbiology results: ?4/26 BCx: 1 of 2 showed gpc in clusters; BCID showed staph species; staph epi, methicillin resistance detected ? ? ?Thank you for allowing pharmacy to be a part of this patient?s care. ? ?Royetta Asal, PharmD, BCPS ?Clinical Pharmacist ?[place] ?Please  utilize Amion for appropriate phone number to reach the unit pharmacist ([place]) ?11/10/2021 4:35 PM ? ? ?

## 2021-11-10 NOTE — Procedures (Signed)
Patient Name: Amanda Short  ?MRN: [id number]  ?Epilepsy Attending: Lora Havens  ?Referring Physician/Provider: Freddi Starr, MD ?Date: 11/09/2021 ?Duration: 22.52 mins ? ?Patient history: 81 year old female with history of seizures, had in-hospital cardiorespiratory arrest. EEG to evaluate for seizure. ? ?Level of alertness: comatose ? ?AEDs during EEG study: LEV ? ?Technical aspects: This EEG study was done with scalp electrodes positioned according to the 10-20 International system of electrode placement. Electrical activity was acquired at a sampling rate of 500Hz and reviewed with a high frequency filter of 70Hz and a low frequency filter of 1Hz. EEG data were recorded continuously and digitally stored.  ? ?Description: EEG showed continuous generalized 3 to 6 Hz theta-delta slowing. Hyperventilation and photic stimulation were not performed.    ? ?ABNORMALITY ?- Continuous slow, generalized ? ?IMPRESSION: ?This study is suggestive of severe diffuse encephalopathy, nonspecific etiology. No seizures or epileptiform discharges were seen throughout the recording. ? ?Lora Havens  ? ?

## 2021-11-11 DIAGNOSIS — Z7189 Other specified counseling: Secondary | ICD-10-CM

## 2021-11-11 DIAGNOSIS — I50811 Acute right heart failure: Secondary | ICD-10-CM | POA: Diagnosis not present

## 2021-11-11 DIAGNOSIS — Z515 Encounter for palliative care: Secondary | ICD-10-CM | POA: Diagnosis not present

## 2021-11-11 LAB — CULTURE, BLOOD (ROUTINE X 2): Special Requests: ADEQUATE

## 2021-11-11 LAB — CREATININE, SERUM
Creatinine, Ser: 3.5 mg/dL — ABNORMAL HIGH (ref 0.44–1.00)
GFR, Estimated: 13 mL/min — ABNORMAL LOW (ref >60)

## 2021-11-11 LAB — GLUCOSE, CAPILLARY
Glucose-Capillary: 118 mg/dL — ABNORMAL HIGH (ref 70–99)
Glucose-Capillary: 131 mg/dL — ABNORMAL HIGH (ref 70–99)

## 2021-11-11 LAB — POTASSIUM: Potassium: 5.2 mmol/L — ABNORMAL HIGH (ref 3.5–5.1)

## 2021-11-11 LAB — BRAIN NATRIURETIC PEPTIDE: B Natriuretic Peptide: 4059.9 pg/mL — ABNORMAL HIGH (ref 0.0–100.0)

## 2021-11-11 MED ORDER — ONDANSETRON HCL 4 MG/2ML IJ SOLN
INTRAMUSCULAR | Status: AC
  Administered 2021-11-11: 4 mg via INTRAVENOUS
  Filled 2021-11-11: qty 2

## 2021-11-11 MED ORDER — LORAZEPAM 2 MG/ML PO CONC: 1.0000 mg | ORAL | Status: DC | PRN

## 2021-11-11 MED ORDER — DOCUSATE SODIUM 50 MG/5ML PO LIQD: 100.0000 mg | Freq: Every day | ORAL | Status: DC | PRN

## 2021-11-11 MED ORDER — LORAZEPAM 1 MG PO TABS
1.0000 mg | ORAL_TABLET | ORAL | Status: DC | PRN
Start: 2021-11-11 — End: 2021-11-11

## 2021-11-11 MED ORDER — FUROSEMIDE 10 MG/ML IJ SOLN
80.0000 mg | Freq: Once | INTRAMUSCULAR | Status: AC
Start: 2021-11-11 — End: 2021-11-11
  Administered 2021-11-11: 80 mg via INTRAVENOUS
  Filled 2021-11-11: qty 8

## 2021-11-11 MED ORDER — SODIUM POLYSTYRENE SULFONATE 15 GM/60ML PO SUSP
30.0000 g | Freq: Once | ORAL | Status: AC
  Administered 2021-11-11: 30 g via RECTAL
  Filled 2021-11-11: qty 120

## 2021-11-11 MED ORDER — CHLORHEXIDINE GLUCONATE 0.12 % MT SOLN: 15.0000 mL | Freq: Two times a day (BID) | OROMUCOSAL | Status: DC

## 2021-11-11 MED ORDER — MORPHINE SULFATE (PF) 2 MG/ML IV SOLN
1.0000 mg | INTRAVENOUS | Status: DC | PRN
  Administered 2021-11-11: 1 mg via INTRAVENOUS
  Filled 2021-11-11: qty 1

## 2021-11-11 MED ORDER — HYDROMORPHONE HCL 1 MG/ML IJ SOLN: 0.5000 mg | INTRAMUSCULAR | Status: DC | PRN

## 2021-11-11 MED ORDER — LIDOCAINE HCL (PF) 1 % IJ SOLN
INTRAMUSCULAR | Status: AC
  Filled 2021-11-11: qty 5

## 2021-11-11 MED ORDER — ORAL CARE MOUTH RINSE: 15.0000 mL | Freq: Two times a day (BID) | OROMUCOSAL | Status: DC

## 2021-11-11 MED ORDER — ONDANSETRON HCL 4 MG/2ML IJ SOLN: 4.0000 mg | Freq: Four times a day (QID) | INTRAMUSCULAR | Status: DC | PRN

## 2021-11-11 MED ORDER — FENTANYL CITRATE PF 50 MCG/ML IJ SOSY
25.0000 ug | PREFILLED_SYRINGE | INTRAMUSCULAR | Status: DC | PRN
  Administered 2021-11-11: 50 ug via INTRAVENOUS
  Filled 2021-11-11: qty 1

## 2021-11-11 MED ORDER — LORAZEPAM 2 MG/ML IJ SOLN
1.0000 mg | INTRAMUSCULAR | Status: DC | PRN
Start: 2021-11-11 — End: 2021-11-11
  Administered 2021-11-11: 1 mg via INTRAVENOUS
  Filled 2021-11-11: qty 1

## 2021-11-14 LAB — CULTURE, BLOOD (ROUTINE X 2)
Culture: NO GROWTH
Special Requests: ADEQUATE

## 2021-11-14 NOTE — Death Summary Note (Incomplete)
Pt on comfort care 11-27-2021. No spontaneous respirations and no pulse noted. Time of death 60, pronounced by [place], RN and Farrell Ours, RN. Family at bedside at time of passing. All belongings returned. Gerald Leitz, NP notified.  ?

## 2021-11-14 NOTE — TOC Progression Note (Signed)
Transition of Care (TOC) - Progression Note  ? ? ?Patient Details  ?Name: Amanda Short ?MRN: [id number] ?Date of Birth: 1940/11/21 ? ?Transition of Care (TOC) CM/SW Contact  ?Purcell Mouton, RN ?Phone Number: ?Nov 21, 2021, 12:09 PM ? ?Clinical Narrative:    ? ?Spoke with pt's husband and son concerning residential hospice. [facility] [name] was selected. Referral given to in house rep.  ? ?Expected Discharge Plan: Home/Self Care ?Barriers to Discharge: Continued Medical Work up ? ?Expected Discharge Plan and Services ?Expected Discharge Plan: Home/Self Care ?  ?  ?Post Acute Care Choice: Home Health, [place] ?Living arrangements for the past 2 months: [place] ?                ?  ?  ?  ?  ?  ?  ?  ?  ?  ?  ? ? ?Social Determinants of Health (SDOH) Interventions ?  ? ?Readmission Risk Interventions ?   ? View : No data to display.  ?  ?  ?  ? ? ?

## 2021-11-14 NOTE — Progress Notes (Signed)
? ?NAMEScotlynn Short, MRN:  [id number], DOB:  09-Jul-1941, LOS: 3 ?ADMISSION DATE:  10/31/2021, CONSULTATION DATE:  11/09/2021 ?REFERRING MD:  Dr. Jackolyn Confer, CHIEF COMPLAINT:  Cardiac arrest   ? ?History of Present Illness:  ?Amanda Short is an 81 year old female with a extensive past medical history significant for but not limited to diastolic congestive heart failure, chronic respiratory failure with hypoxia requiring 4 L nasal cannula at baseline, severe pulmonary hypertension with cor pulmonale, Gold stage II COPD, OSA on CPAP CKD stage IIIb, insulin-dependent diabetes, prior pulmonary embolism anticoagulated on Xarelto, seizure disorder and gout who initially presented to the emergency department at [facility] 4/24 for complaints of worsening cough, wheezing, and shortness of breath.  Patient reports symptoms of URI 3 to 4 days prior to admission.  Attempted to uptitrate home oxygen with little improvement in dyspnea. ? ?On ED arrival patient was seen tachypneic and tachycardic with mild hypoxia on 4 L nasal cannula.  Chest x-ray negative for acute infiltrates.  Patient was admitted per hospitalist team for management of acute COPD and CHF exasperation's. ? ?Mid morning of 4/26 patient suddenly became bradycardic and unresponsive. The bradycardia responded to atropine push but mentation remained poor with development of agonal respirations. Code was called and patient was intubated per anesthesia. PCCM consulted for further management.  ? ?Pertinent  Medical History  ?Extensive past medical history significant for but not limited to diastolic congestive heart failure, chronic respiratory failure with hypoxia requiring 4 L nasal cannula at baseline, severe pulmonary hypertension with cor pulmonale, Gold stage II COPD, OSA on CPAP CKD stage IIIb, insulin-dependent diabetes, prior pulmonary embolism anticoagulated on Xarelto, seizure disorder and gout ? ?Significant Hospital Events: ?Including procedures,  antibiotic start and stop dates in addition to other pertinent events   ?4/24 admitted initially for complaints of shortness of breath, cough, and wheezing admitted per hospitalist team for management of acute COPD and CHF exasperation's ?4/26 Sudden onset of bradycardia followed by respiratory arrest, intubated and transferred to ICU ?ECHO 4/26 No wall motion abnormalities of LV and normal EF. Severe dilation of RV with mild systolic dysfunction.  ?4/28 tolerated extubation yesterday but patient continues to remain dyspneic and uncomfortable with signs of progressive renal failure ? ?Interim History / Subjective:  ?Discussed the patient's current plan of care with her this a.m. she indicated desire to transition to comfort care with the knowledge that quality of life may be compromised moving forward. ? ?Once husband arrived same discussion was held with him inpatient and he also agrees to transition care to comfort approach. ? ?Objective   ?Blood pressure (!) 141/44, pulse 71, temperature 98 ?F (36.7 ?C), temperature source Axillary, resp. rate (!) 26, height 5' (1.524 m), weight 72.4 kg, SpO2 95 %. ?   ?Vent Mode: BIPAP;PSV ?FiO2 (%):  [45 %-50 %] 50 % ?Set Rate:  [18 bmp] 18 bmp ?Vt Set:  [360 mL] 360 mL ?PEEP:  [5 cmH20] 5 cmH20 ?Pressure Support:  [7 cmH20-10 cmH20] 7 cmH20 ?Plateau Pressure:  [15 cmH20] 15 cmH20  ? ?Intake/Output Summary (Last 24 hours) at Dec 05, 2021 0718 ?Last data filed at 12-05-2021 0700 ?Gross per 24 hour  ?Intake 1424.39 ml  ?Output 270 ml  ?Net 1154.39 ml  ? ? ?Filed Weights  ? 11/09/21 0500 11/10/21 0500 05-Dec-2021 0500  ?Weight: 69.7 kg 69.7 kg 72.4 kg  ? ?Examination: ?General: Acute on chronic deconditioned elderly female lying in bed in mild discomfort ?HEENT: [place]/AT, MM pink/moist, PERRL,  ?Neuro: Slightly lethargic  but alert and oriented x3 ?CV: s1s2 regular rate and rhythm, no murmur, rubs, or gallops,  ?PULM: Diminished bilaterally, mild increased work of breathing with accessory  muscle use, on nasal cannula ?GI: soft, bowel sounds active in all 4 quadrants, non-tender, non-distended ?Extremities: warm/dry, nonpitting edema  ?Skin: no rashes or lesions ? ?Resolved Hospital Problem list   ? ? ?Assessment & Plan:  ?In hospital respiratory arrest ?-Suddenly onset bradycardic and unresponsive. The bradycardia responded to atropine push but mentation remained poor with development of agonal respirations. Code was called and patient was intubated per anesthesia.  ?Bradycardia  ?Possibly in setting of increased beta blocker dose and home diltiazem dose ?Acute on Chronic Congestive heart failure ?-BNP 4/27 > 4,500 ?-ECHO 4/26 No wall motion abnormalities of LV and normal EF. Severe dilation of RV with mild systolic dysfunction.  ?History of HTN/HLD ?-Home medication includes Lipitor, Dital exam, Lasix, metoprolol ?Acute on chronic hypoxic respiratory failure following respiratory arrest  ?-Right extubation 4/27 ?Acute exacerbation of COPD with history of stage Gold 2 COPD ?OSA ?Severe pulmonary hypertension/core pulmonary ?History of pulmonary embolism anticoagulated on Xarelto ?AKI superimposed on CKD stage IIIb ?-Creatinine 1.32, GFR 41 on admission last with severe hypotension requiring pressors creatinine continues to rise indicating severe renal injury.  Creatinine today 4/28 3.50 with GFR 13 ?Hyperkalemia ?History of insulin-dependent diabetes ?-Home medication includes Tyler Aas ?History of hypothyroidism ?Seizure disorder ?-Home medications include Keppra ?History of anxiety/depression ?-Home medication includes Cymbalta ? ?Plan ?Goals of care discussion held with both patient individually and later patient and husband at bedside.  Patient and spouse both agree that quality of life is highest priority and if this cannot be maintained she would like to transition to a comfort approach.  Given progressive renal injury with underlying severe cardiovascular compromise quality of life is likely  compromised moving forward.  With that knowledge patient would like to transition to comfort approach today.  We will consult transitions of care to assist in discharge disposition planning.  I did utilize morphine for air hunger/discomfort. ? ? ?Best Practice (right click and "Reselect all SmartList Selections" daily)  ? ?Diet/type: NPO ?DVT prophylaxis: DOAC ?GI prophylaxis: PPI ?Lines: Central line ?Foley:  Yes, and it is still needed ?Code Status:  full code ?Last date of multidisciplinary goals of care discussion: See separate plan of care note 4/26, palliative care consult placed 4/27. ? ?Critical care time:  NA  ?Charman Blasco D. Harris, NP-C ?[facility] ?Personal contact information can be found on Amion  ?Dec 04, 2021, 7:19 AM ? ? ? ? ? ? ? ? ?

## 2021-11-14 NOTE — Consult Note (Addendum)
? ?                                                                                ?Consultation Note ?Date: 2021-11-15  ? ?Patient Name: Amanda Short  ?DOB: 21-Nov-1940  MRN: [id number]  Age / Sex: 81 y.o., female  ?PCP: Haydee Salter, MD ?Referring Physician: Freddi Starr, MD ? ?Reason for Consultation: Terminal Care ? ?HPI/Patient Profile: 81 y.o. female  with past medical history of   admitted on 10/24/2021   ? ?Clinical Assessment and Goals of Care: ? 81 year old with history of severe pulmonary hypertension, HFpEF, chronic hypoxemic respiratory failure, CKDIIIb, DMII and pulmonary embolism who was admitted 4/24 for COPD exacerbation and had code blue event with respiratory/bradycardia arrest on 4/26 requiring atropine and intubation. She was extubated 4/27 and has decided to be DNR/DNI with comfort measures and has ongoing renal failure.  ? ?Patient is resting in bed, she has shallow breathing, mild respiratory distress. Son and husband at bedside.  ? ?Palliative medicine is specialized medical care for people living with serious illness. It focuses on providing relief from the symptoms and stress of a serious illness. The goal is to improve quality of life for both the patient and the family. ?Goals of care: Broad aims of medical therapy in relation to the patient's values and preferences. Our aim is to provide medical care aimed at enabling patients to achieve the goals that matter most to them, given the circumstances of their particular medical situation and their constraints.  ? ? ?NEXT OF KIN ? Husband and son. They live in [name].  ? ?[place]   ? Comfort measures ?Add IV Dilaudid PRN ?Prognosis appears markedly limited to hours to days ?Residential hospice versus anticipated in hospital death ?Continue to support patient and family as a unit ?Thank you for the consult.  ? ?Code Status/Advance Care Planning: ?DNR ? ? ?Symptom Management:  ? As above.  ? ?Palliative  Prophylaxis:  ?Frequent Pain Assessment ? ?Additional Recommendations (Limitations, Scope, Preferences): ?Full Comfort Care ? ?Psycho-social/Spiritual:  ?Desire for further Chaplaincy support:yes ?Additional Recommendations: Caregiving  Support/Resources ? ?Prognosis:  ?Hours - Days ? ?Discharge Planning: Anticipated Hospital Death  ? ?  ? ?Primary Diagnoses: ?Present on Admission: ? Acute right heart failure ([place]) ? Pulmonary hypertension ([place]) ? ? ?I have reviewed the medical record, interviewed the patient and family, and examined the patient. The following aspects are pertinent. ? ?Past Medical History:  ?Diagnosis Date  ? Abdominal pain   ? Abnormal CT of the abdomen   ? Acute blood loss anemia   ? Acute cystitis with hematuria 12/19/2019  ? Acute encephalopathy 06/06/2017  ? Acute pulmonary embolism ([place]) 04/01/2013  ? Acute respiratory failure with hypoxia ([place])   ? Anxiety state   ? Arthritis   ? "back, hands" (07/22/2015)  ? Aspiration pneumonia ([place])   ? Benign essential HTN   ? Cancer of left breast [facility]) 2006  ? S/P lumpectomy  ? Cellulitis of foot, right 07/21/2015  ? Cellulitis of right foot 07/21/2015  ? Central retinal vein occlusion of right eye 04/20/2016  ? Chronic diastolic CHF (congestive heart failure) ([place])  07/10/2017  ? Chronic respiratory failure with hypoxia ([facility])   ? Colitis 05/27/2017  ? Complication of anesthesia   ? "brief breathing problem at surgery center in ~ 2005 when I had gallbladder OR"  ? Concussion 03/2013  ? due to fall  ? Depression   ? Diabetes ([place]) 04/01/2013  ? Diabetes mellitus type 2 in nonobese [facility])   ? Diabetes mellitus type 2, controlled ([place]) 07/21/2015  ? Diabetic foot infection ([place]) 07/21/2015  ? Diverticulitis of colon   ? Diverticulitis of large intestine without perforation or abscess without bleeding   ? Diverticulosis   ? DVT (deep venous thrombosis) ([place]) 03/2013  ? LLE  ? Encephalopathy   ? Encounter for attention to tracheostomy [facility])   ? Encounter for central line  placement   ? Encounter for orogastric (OG) tube placement   ? Essential hypertension 04/01/2013  ? Essential hypertension, benign 04/01/2013  ? GERD (gastroesophageal reflux disease)   ? History of acute respiratory failure   ? History of breast cancer 2006  ? S/P lumpectomy  ? History of pulmonary embolism   ? HX: breast cancer 04/01/2013  ? Hyperlipidemia   ? Hypertension   ? Hypocalcemia 05/27/2017  ? Hypokalemia 07/10/2017  ? Hypomagnesemia 07/11/2017  ? Hypothyroidism   ? Hypoxia 03/23/2020  ? Nausea vomiting and diarrhea 07/10/2017  ? Non-intractable cyclical vomiting with nausea   ? Nuclear sclerotic cataract of both eyes 04/20/2016  ? OSA (obstructive sleep apnea) 07/10/2017  ? OSA on CPAP 04/01/2013  ? OSA treated with BiPAP   ? Physical debility 06/23/2017  ? Prolonged QT interval 05/27/2017  ? Pulmonary embolism ([place]) 03/2013  ? Pulmonary HTN ([place]) 07/05/2017  ? Pulmonary hypertension ([place]) 07/05/2017  ? Pulmonary nodule 07/26/2020  ? Seizure ([place]) 07/10/2017  ? Seizure disorder ([place]) 07/05/2017  ? Seizures ([place])   ? Sepsis ([place]) 07/10/2017  ? Sleep apnea   ? Status epilepticus ([place])   ? 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 Tachypnea   ? Thyroid disease   ? Hypothyroid  ? Tracheostomy, acute management ([place])   ? Type II diabetes mellitus ([place])   ? ?Social History  ? ?Socioeconomic History  ? Marital status: Married  ?  Spouse name: Not on file  ? Number of children: 1  ? Years of education: Not on file  ? Highest education level: Not on file  ?Occupational History  ? Occupation: retired  ?Tobacco Use  ? Smoking status: Former  ?  Packs/day: 1.00  ?  Years: 30.00  ?  Pack years: 30.00  ?  Types: Cigarettes  ?  Quit date: 1995  ?  Years since quitting: 28.3  ? Smokeless tobacco: Never  ? Tobacco comments:  ?  "quit smoking cigarettes in the 1990s"  ?Vaping Use  ? Vaping Use: Never used  ?Substance and Sexual Activity  ? Alcohol use: No  ? Drug use: No  ? Sexual activity: Not Currently  ?Other Topics Concern  ? Not  on file  ?Social History Narrative  ? Not on file  ? ?Social Determinants of Health  ? ?Financial Resource Strain: Not on file  ?Food Insecurity: Not on file  ?Transportation Needs: Not on file  ?Physical Activity: Not on file  ?Stress: Not on file  ?Social Connections: Not on file  ? ?Family History  ?Problem Relation Age of Onset  ? Diabetes Other   ? Breast cancer Sister   ? Liver cancer Mother   ? Uterine cancer Mother   ?  Hypertension Mother   ? Colon cancer Maternal Grandmother   ? Breast cancer Cousin   ?     couple  ? Kidney disease Sister   ? Diabetes Sister   ? ?Scheduled Meds: ? arformoterol  15 mcg Nebulization BID  ? budesonide (PULMICORT) nebulizer solution  0.25 mg Nebulization BID  ? chlorhexidine  15 mL Mouth Rinse BID  ? Chlorhexidine Gluconate Cloth  6 each Topical Daily  ? levETIRAcetam  500 mg Per Tube BID  ? lidocaine (PF)      ? mouth rinse  15 mL Mouth Rinse q12n4p  ? polyethylene glycol  17 g Per Tube Daily  ? revefenacin  175 mcg Nebulization Daily  ? sodium chloride flush  3 mL Intravenous Q12H  ? sodium chloride flush  3 mL Intravenous Q12H  ? ?Continuous Infusions: ? sodium chloride    ? sodium chloride Stopped (11/09/21 1500)  ? ?PRN Meds:.sodium chloride, acetaminophen **OR** acetaminophen, dextrose, docusate, fentaNYL (SUBLIMAZE) injection, HYDROmorphone (DILAUDID) injection, LORazepam **OR** LORazepam **OR** LORazepam, ondansetron (ZOFRAN) IV, sodium chloride flush ?Medications Prior to Admission:  ?Prior to Admission medications   ?Medication Sig Start Date End Date Taking? Authorizing Provider  ?acetaminophen (TYLENOL) 500 MG tablet Take 500-1,000 mg by mouth every 6 (six) hours as needed for mild pain.    Yes [provider]  ?ALPRAZolam (XANAX) 0.5 MG tablet TAKE 1 TABLET (0.5 MG TOTAL) BY MOUTH AT BEDTIME AS NEEDED FOR SLEEP. ?Patient taking differently: Take 0.5 mg by mouth at bedtime. 09/14/21  Yes Haydee Salter, MD  ?atorvastatin (LIPITOR) 20 MG tablet Take 20 mg by  mouth daily.   Yes [provider]  ?calcium-vitamin D (OSCAL WITH D) 500-200 MG-UNIT tablet Take 1 tablet by mouth 2 (two) times daily.   Yes [provider]  ?diltiazem (CARDIZEM) 30 MG

## 2021-11-14 NOTE — Death Summary Note (Signed)
?DEATH SUMMARY  ? ?Patient Details  ?Name: Amanda Short ?MRN: [id number] ?DOB: 10-Jun-1941 ? ?Admission/Discharge Information  ? ?Admit Date:  11/28/2021  ?Date of Death: Date of Death: Dec 02, 2021  ?Time of Death: Time of Death: 4098  ?Length of Stay: 3  ?Referring Physician: Haydee Salter, MD  ? ?Reason(s) for Hospitalization  ?Acute on Chronic Respiratory Failure ? ?Diagnoses  ?Preliminary cause of death:  ?Renal Failure ? ?Secondary Diagnoses (including complications and co-morbidities):  ?Principal Problem: ?  Acute right heart failure ([place]) ?Active Problems: ?  Pulmonary hypertension ([place]) ?  COPD with acute exacerbation ([place]) ?  CHF (congestive heart failure) ([place]) ? ? ?Brief Hospital Course (including significant findings, care, treatment, and services provided and events leading to death)  ?Amanda Short is a 81 y.o. year old female  with severe pulmonary hypertension, HFpEF, chronic hypoxemic respiratory failure, CKDIIIb, DMII and pulmonary embolism who was admitted Nov 29, 2022 for COPD exacerbation and had code blue event with respiratory/bradycardia arrest on 4/26 requiring atropine and intubation. She was extubated 4/27 and decided to be DNR/DNI with comfort measures given degree of renal failure that was not improving. She passed away 2021/12/02 at 1259. ? ?Pertinent Labs and Studies  ?Significant Diagnostic Studies ?DG Chest 2 View ? ?Result Date: 11/06/2021 ?CLINICAL DATA:  Dyspnea. EXAM: CHEST - 2 VIEW COMPARISON:  February 27, 2020 FINDINGS: Tortuosity of the aorta. Cardiomediastinal silhouette is mildly enlarged. Mediastinal contours appear intact. There is no evidence of focal airspace consolidation, pleural effusion or pneumothorax. Osseous structures are without acute abnormality. Soft tissues are grossly normal. IMPRESSION: 1. Mildly enlarged cardiac silhouette. 2. No evidence of focal airspace consolidation. Electronically Signed   By: Fidela Salisbury M.D.   On: 11/06/2021 19:14  ? ?DG Abd 1  View ? ?Result Date: 11/09/2021 ?CLINICAL DATA:  OG tube placement. EXAM: ABDOMEN - 1 VIEW COMPARISON:  None. FINDINGS: Tip and side port of the enteric tube below the diaphragm in the stomach. Moderate volume of stool in the included colon. IMPRESSION: Tip and side port of the enteric tube below the diaphragm in the stomach. Electronically Signed   By: Keith Rake M.D.   On: 11/09/2021 19:44  ? ?DG CHEST PORT 1 VIEW ? ?Result Date: 11/09/2021 ?CLINICAL DATA:  Central line placement EXAM: PORTABLE CHEST 1 VIEW COMPARISON:  2021/11/28 FINDINGS: Cardiomegaly. Endotracheal tube is positioned with tip above the carina. Left neck vascular catheter with tip over the superior vena cava. No acute abnormality of the lungs. Severe bilateral glenohumeral arthrosis. IMPRESSION: 1. Endotracheal tube is positioned with tip above the carina. 2. Left neck vascular catheter with tip over the superior vena cava. 3. Cardiomegaly without acute abnormality of the lungs. Electronically Signed   By: Delanna Ahmadi M.D.   On: 11/09/2021 15:42  ? ?DG Chest Portable 1 View ? ?Result Date: 11-28-21 ?CLINICAL DATA:  Shortness of breath EXAM: PORTABLE CHEST 1 VIEW COMPARISON:  11/06/2021, CT 08/18/2020 FINDINGS: Mild cardiomegaly. No focal opacity or pleural effusion. No pneumothorax. Enlarged central pulmonary vessels suggesting arterial hypertension. IMPRESSION: 1. Cardiomegaly without edema 2. Enlarged central pulmonary vessels suspicious for arterial hypertension Electronically Signed   By: Donavan Foil M.D.   On: 28-Nov-2021 22:46  ? ?EEG adult ? ?Result Date: 11/10/2021 ?Lora Havens, MD     11/10/2021  8:29 AM Patient Name: Amanda Short MRN: [id number] Epilepsy Attending: Lora Havens Referring Physician/Provider: Freddi Starr, MD Date: 11/09/2021 Duration: 22.52 mins Patient history: 81 year old female with history of seizures, had in-hospital  cardiorespiratory arrest. EEG to evaluate for seizure. Level of alertness:  comatose AEDs during EEG study: LEV Technical aspects: This EEG study was done with scalp electrodes positioned according to the 10-20 International system of electrode placement. Electrical activity was acquired at a sampling rate of 500Hz and reviewed with a high frequency filter of 70Hz and a low frequency filter of 1Hz. EEG data were recorded continuously and digitally stored. Description: EEG showed continuous generalized 3 to 6 Hz theta-delta slowing. Hyperventilation and photic stimulation were not performed.   ABNORMALITY - Continuous slow, generalized IMPRESSION: This study is suggestive of severe diffuse encephalopathy, nonspecific etiology. No seizures or epileptiform discharges were seen throughout the recording. Priyanka Barbra Sarks  ? ?ECHOCARDIOGRAM LIMITED ? ?Result Date: 11/09/2021 ?   ECHOCARDIOGRAM LIMITED REPORT   Patient Name:   Amanda Short Date of Exam: 11/09/2021 Medical Rec #:  [id number]         Height:       60.0 in Accession #:    [id number]        Weight:       153.7 lb Date of Birth:  01/23/1941         BSA:          1.669 m? Patient Age:    81 years          BP:           132/99 mmHg Patient Gender: F                 HR:           51 bpm. Exam Location:  Inpatient Procedure: Limited Echo, Limited Color Doppler and Cardiac Doppler STAT ECHO Indications:    acute respiratory distress. Bradycardia.  History:        Patient has prior history of Echocardiogram examinations, most                 recent 05/25/2021. CHF, COPD, chronic kidney disease. and                 Pulmonary HTN; Risk Factors:Hypertension, Dyslipidemia and                 Diabetes.  Sonographer:    Johny Chess RDCS Referring Phys: [id number] Freddi Starr  Sonographer Comments: Echo performed with patient supine and on artificial respirator. IMPRESSIONS  1. Left ventricular ejection fraction, by estimation, is 60 to 65%. The left ventricle has normal function. The left ventricle has no regional wall motion  abnormalities. Left ventricular diastolic parameters are consistent with Grade I diastolic dysfunction (impaired relaxation). There is the interventricular septum is flattened in systole and diastole, consistent with right ventricular pressure and volume overload.  2. Right ventricular systolic function is mildly reduced. The right ventricular size is severely enlarged. There is severely elevated pulmonary artery systolic pressure.  3. The mitral valve is normal in structure. No evidence of mitral valve regurgitation. No evidence of mitral stenosis.  4. Tricuspid valve regurgitation is moderate to severe.  5. The aortic valve is tricuspid. Aortic valve regurgitation is mild. No aortic stenosis is present.  6. The inferior vena cava is normal in size with greater than 50% respiratory variability, suggesting right atrial pressure of 3 mmHg. FINDINGS  Left Ventricle: Left ventricular ejection fraction, by estimation, is 60 to 65%. The left ventricle has normal function. The left ventricle has no regional wall motion abnormalities. The left ventricular internal cavity size was normal in size. There is  no left ventricular hypertrophy. The interventricular septum is flattened in systole and diastole, consistent with right ventricular pressure and volume overload. Left ventricular diastolic parameters are consistent with Grade I diastolic dysfunction (impaired relaxation). Right Ventricle: The right ventricular size is severely enlarged. No increase in right ventricular wall thickness. Right ventricular systolic function is mildly reduced. There is severely elevated pulmonary artery systolic pressure. The tricuspid regurgitant velocity is 3.84 m/s, and with an assumed right atrial pressure of 15 mmHg, the estimated right ventricular systolic pressure is 04.5 mmHg. Left Atrium: Left atrial size was normal in size. Right Atrium: Right atrial size was normal in size. Pericardium: There is no evidence of pericardial effusion.  Mitral Valve: The mitral valve is normal in structure. No evidence of mitral valve stenosis. Tricuspid Valve: The tricuspid valve is normal in structure. Tricuspid valve regurgitation is moderate to severe. No evide

## 2021-11-14 NOTE — TOC Progression Note (Signed)
Transition of Care (TOC) - Progression Note  ? ? ?Patient Details  ?Name: Amanda Short ?MRN: [id number] ?Date of Birth: 03/08/1941 ? ?Transition of Care (TOC) CM/SW Contact  ?Purcell Mouton, RN ?Phone Number: ?Nov 17, 2021, 12:40 PM ? ?Clinical Narrative:    ? ?[facility] bed cancelled related to pt not being stable to transfer.  ? ?Expected Discharge Plan: Home/Self Care ?Barriers to Discharge: Continued Medical Work up ? ?Expected Discharge Plan and Services ?Expected Discharge Plan: Home/Self Care ?  ?  ?Post Acute Care Choice: Home Health, [place] ?Living arrangements for the past 2 months: [place] ?                ?  ?  ?  ?  ?  ?  ?  ?  ?  ?  ? ? ?Social Determinants of Health (SDOH) Interventions ?  ? ?Readmission Risk Interventions ?   ? View : No data to display.  ?  ?  ?  ? ? ?

## 2021-11-14 NOTE — Progress Notes (Signed)
OT Cancellation Note ? ?Patient Details ?Name: Amanda Short ?MRN: [id number] ?DOB: 1941/07/05 ? ? ?Cancelled Treatment:    Reason Eval/Treat Not Completed: Medical issues which prohibited therapy. Patient on BIPAP. Will continue to follow. ? ?Elisha Cooksey L Davanee Klinkner ?Dec 11, 2021, 11:02 AM ?

## 2021-11-14 NOTE — Progress Notes (Signed)
Physical Therapy Discharge ?Patient Details ?Name: Amanda Short ?MRN: [id number] ?DOB: [birth year] ?Today's Date: 11-14-2021 ?Time:  -  ?  ? ?Patient discharged from PT services secondary to medical decline - patient on comfort care measures per MD. ?Tresa Endo PT ?Acute Rehabilitation Services ?Pager [phone] ?Office [phone] ? ?GP ?   ? ?Roan Sawchuk, Shella Maxim ?2021/11/14, 11:21 AM  ?

## 2021-11-14 NOTE — Progress Notes (Addendum)
eLink Physician-Brief Progress Note ?Patient Name: Amanda Short ?DOB: 1941/02/08 ?MRN: [id number] ? ? ?Date of Service ? 11-20-21  ?HPI/Events of Note ? K+ 5.7, 60 cc of urine output  recently despite 40 mg of Lasix iv, most recent BNP > 4500, patient is NPO on BIPAP.  ?eICU Interventions ? Lasix 80 mg iv x 1, Kayexalate 30 gm rectally x 1.  ? ? ? ?  ? ?Kerry Kass Harjot Zavadil ?11/20/21, 12:39 AM ?

## 2021-11-14 DEATH — deceased

## 2021-11-28 ENCOUNTER — Encounter (HOSPITAL_COMMUNITY): Payer: Medicare PPO | Admitting: Internal Medicine

## 2021-11-29 ENCOUNTER — Encounter: Payer: Medicare PPO | Admitting: Family Medicine

## 2022-01-25 ENCOUNTER — Ambulatory Visit: Payer: Medicare PPO | Admitting: Cardiology
